# Patient Record
Sex: Female | Born: 1965 | Race: White | Hispanic: No | Marital: Single | Smoking: Former smoker
Health system: Southern US, Community
[De-identification: ages and names within clinical notes are randomized; demographics above are authoritative.]

## PROBLEM LIST (undated history)

## (undated) DIAGNOSIS — J40 Bronchitis, not specified as acute or chronic: Secondary | ICD-10-CM

## (undated) DIAGNOSIS — E785 Hyperlipidemia, unspecified: Secondary | ICD-10-CM

## (undated) DIAGNOSIS — I10 Essential (primary) hypertension: Secondary | ICD-10-CM

## (undated) DIAGNOSIS — R51 Headache: Secondary | ICD-10-CM

## (undated) DIAGNOSIS — J189 Pneumonia, unspecified organism: Secondary | ICD-10-CM

## (undated) DIAGNOSIS — G4730 Sleep apnea, unspecified: Secondary | ICD-10-CM

## (undated) DIAGNOSIS — R519 Headache, unspecified: Secondary | ICD-10-CM

## (undated) DIAGNOSIS — E039 Hypothyroidism, unspecified: Secondary | ICD-10-CM

## (undated) DIAGNOSIS — E119 Type 2 diabetes mellitus without complications: Secondary | ICD-10-CM

## (undated) DIAGNOSIS — Z923 Personal history of irradiation: Secondary | ICD-10-CM

## (undated) DIAGNOSIS — R0683 Snoring: Secondary | ICD-10-CM

## (undated) DIAGNOSIS — T7840XA Allergy, unspecified, initial encounter: Secondary | ICD-10-CM

## (undated) DIAGNOSIS — M2550 Pain in unspecified joint: Secondary | ICD-10-CM

## (undated) DIAGNOSIS — C73 Malignant neoplasm of thyroid gland: Secondary | ICD-10-CM

## (undated) DIAGNOSIS — E669 Obesity, unspecified: Secondary | ICD-10-CM

## (undated) DIAGNOSIS — E282 Polycystic ovarian syndrome: Secondary | ICD-10-CM

## (undated) DIAGNOSIS — K219 Gastro-esophageal reflux disease without esophagitis: Secondary | ICD-10-CM

## (undated) DIAGNOSIS — C50919 Malignant neoplasm of unspecified site of unspecified female breast: Secondary | ICD-10-CM

## (undated) HISTORY — DX: Polycystic ovarian syndrome: E28.2

## (undated) HISTORY — PX: TONSILLECTOMY: SUR1361

## (undated) HISTORY — DX: Hyperlipidemia, unspecified: E78.5

## (undated) HISTORY — DX: Type 2 diabetes mellitus without complications: E11.9

## (undated) HISTORY — DX: Essential (primary) hypertension: I10

## (undated) HISTORY — DX: Malignant neoplasm of thyroid gland: C73

## (undated) HISTORY — DX: Allergy, unspecified, initial encounter: T78.40XA

## (undated) HISTORY — PX: CORONARY ANGIOPLASTY: SHX604

## (undated) HISTORY — DX: Pain in unspecified joint: M25.50

## (undated) HISTORY — DX: Personal history of irradiation: Z92.3

## (undated) HISTORY — DX: Obesity, unspecified: E66.9

---
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
## 1998-01-23 HISTORY — PX: BREAST BIOPSY: SHX20

## 2002-01-23 HISTORY — PX: DILATION AND CURETTAGE OF UTERUS: SHX78

## 2003-07-06 ENCOUNTER — Encounter: Payer: Self-pay | Admitting: Internal Medicine

## 2003-07-06 LAB — CONVERTED CEMR LAB

## 2005-05-15 ENCOUNTER — Emergency Department (HOSPITAL_COMMUNITY): Admission: EM | Admit: 2005-05-15 | Discharge: 2005-05-15 | Payer: Self-pay | Admitting: Emergency Medicine

## 2005-11-13 ENCOUNTER — Emergency Department (HOSPITAL_COMMUNITY): Admission: EM | Admit: 2005-11-13 | Discharge: 2005-11-13 | Payer: Self-pay | Admitting: Family Medicine

## 2006-01-23 DIAGNOSIS — G4730 Sleep apnea, unspecified: Secondary | ICD-10-CM

## 2006-01-23 HISTORY — DX: Sleep apnea, unspecified: G47.30

## 2006-03-30 ENCOUNTER — Inpatient Hospital Stay (HOSPITAL_COMMUNITY): Admission: EM | Admit: 2006-03-30 | Discharge: 2006-04-01 | Payer: Self-pay | Admitting: Emergency Medicine

## 2006-05-28 ENCOUNTER — Ambulatory Visit (HOSPITAL_COMMUNITY): Admission: RE | Admit: 2006-05-28 | Discharge: 2006-05-28 | Payer: Self-pay | Admitting: Internal Medicine

## 2006-05-28 ENCOUNTER — Ambulatory Visit: Payer: Self-pay | Admitting: Internal Medicine

## 2006-05-28 ENCOUNTER — Encounter: Admission: RE | Admit: 2006-05-28 | Discharge: 2006-08-26 | Payer: Self-pay | Admitting: Internal Medicine

## 2006-07-04 ENCOUNTER — Ambulatory Visit: Payer: Self-pay | Admitting: Internal Medicine

## 2006-07-04 LAB — CONVERTED CEMR LAB
Bilirubin, Direct: 0.1 mg/dL (ref 0.0–0.3)
Calcium: 9.2 mg/dL (ref 8.4–10.5)
Cholesterol: 132 mg/dL (ref 0–200)
Creatinine,U: 150.1 mg/dL
GFR calc Af Amer: 175 mL/min
GFR calc non Af Amer: 145 mL/min
Glucose, Bld: 145 mg/dL — ABNORMAL HIGH (ref 70–99)
HDL: 32.9 mg/dL — ABNORMAL LOW (ref >39.0)
LDL Cholesterol: 78 mg/dL (ref 0–99)
Microalb Creat Ratio: 36 mg/g — ABNORMAL HIGH (ref 0.0–30.0)
Potassium: 4.4 meq/L (ref 3.5–5.1)
Sodium: 140 meq/L (ref 135–145)
Total CHOL/HDL Ratio: 4
Triglycerides: 108 mg/dL (ref 0–149)
Uric Acid, Serum: 7.2 mg/dL — ABNORMAL HIGH (ref 2.4–7.0)

## 2006-07-09 ENCOUNTER — Ambulatory Visit: Payer: Self-pay | Admitting: Internal Medicine

## 2006-08-21 ENCOUNTER — Ambulatory Visit (HOSPITAL_BASED_OUTPATIENT_CLINIC_OR_DEPARTMENT_OTHER): Admission: RE | Admit: 2006-08-21 | Discharge: 2006-08-21 | Payer: Self-pay | Admitting: Internal Medicine

## 2006-08-23 ENCOUNTER — Ambulatory Visit: Payer: Self-pay | Admitting: Internal Medicine

## 2006-09-05 ENCOUNTER — Ambulatory Visit: Payer: Self-pay | Admitting: Pulmonary Disease

## 2006-12-13 ENCOUNTER — Ambulatory Visit: Payer: Self-pay | Admitting: Internal Medicine

## 2006-12-13 DIAGNOSIS — E1159 Type 2 diabetes mellitus with other circulatory complications: Secondary | ICD-10-CM | POA: Insufficient documentation

## 2006-12-13 DIAGNOSIS — I10 Essential (primary) hypertension: Secondary | ICD-10-CM | POA: Insufficient documentation

## 2006-12-13 LAB — CONVERTED CEMR LAB
CO2: 27 meq/L (ref 19–32)
Creatinine, Ser: 0.5 mg/dL (ref 0.4–1.2)
GFR calc Af Amer: 175 mL/min
Glucose, Bld: 131 mg/dL — ABNORMAL HIGH (ref 70–99)
Potassium: 4.6 meq/L (ref 3.5–5.1)

## 2006-12-18 ENCOUNTER — Ambulatory Visit: Payer: Self-pay | Admitting: Internal Medicine

## 2006-12-18 DIAGNOSIS — F17200 Nicotine dependence, unspecified, uncomplicated: Secondary | ICD-10-CM | POA: Insufficient documentation

## 2006-12-18 DIAGNOSIS — E1169 Type 2 diabetes mellitus with other specified complication: Secondary | ICD-10-CM | POA: Insufficient documentation

## 2006-12-18 DIAGNOSIS — E785 Hyperlipidemia, unspecified: Secondary | ICD-10-CM | POA: Insufficient documentation

## 2006-12-18 DIAGNOSIS — Z87898 Personal history of other specified conditions: Secondary | ICD-10-CM | POA: Insufficient documentation

## 2006-12-18 DIAGNOSIS — J309 Allergic rhinitis, unspecified: Secondary | ICD-10-CM | POA: Insufficient documentation

## 2007-01-10 ENCOUNTER — Encounter: Admission: RE | Admit: 2007-01-10 | Discharge: 2007-01-10 | Payer: Self-pay | Admitting: Internal Medicine

## 2007-06-14 ENCOUNTER — Ambulatory Visit: Payer: Self-pay | Admitting: Internal Medicine

## 2007-06-18 ENCOUNTER — Ambulatory Visit: Payer: Self-pay | Admitting: Internal Medicine

## 2007-06-18 LAB — CONVERTED CEMR LAB
Albumin: 3.7 g/dL (ref 3.5–5.2)
Alkaline Phosphatase: 69 units/L (ref 39–117)
BUN: 10 mg/dL (ref 6–23)
Calcium: 9.2 mg/dL (ref 8.4–10.5)
Cholesterol: 115 mg/dL (ref 0–200)
GFR calc Af Amer: 174 mL/min
GFR calc non Af Amer: 144 mL/min
HDL: 35.5 mg/dL — ABNORMAL LOW (ref >39.0)
Hgb A1c MFr Bld: 5.8 % (ref 4.6–6.0)
LDL Cholesterol: 55 mg/dL (ref 0–99)
Microalb Creat Ratio: 8.3 mg/g (ref 0.0–30.0)
Microalb, Ur: 1.2 mg/dL (ref 0.0–1.9)
Potassium: 4.6 meq/L (ref 3.5–5.1)
Sodium: 137 meq/L (ref 135–145)
Total Protein: 6.9 g/dL (ref 6.0–8.3)
VLDL: 24 mg/dL (ref 0–40)

## 2007-06-27 ENCOUNTER — Ambulatory Visit: Payer: Self-pay | Admitting: Internal Medicine

## 2007-07-05 ENCOUNTER — Encounter: Payer: Self-pay | Admitting: Internal Medicine

## 2007-10-15 ENCOUNTER — Other Ambulatory Visit: Admission: RE | Admit: 2007-10-15 | Discharge: 2007-10-15 | Payer: Self-pay | Admitting: Obstetrics and Gynecology

## 2007-11-06 ENCOUNTER — Ambulatory Visit (HOSPITAL_COMMUNITY): Admission: RE | Admit: 2007-11-06 | Discharge: 2007-11-06 | Payer: Self-pay | Admitting: Obstetrics and Gynecology

## 2007-11-18 LAB — CONVERTED CEMR LAB: Pap Smear: NORMAL

## 2007-12-18 ENCOUNTER — Emergency Department (HOSPITAL_COMMUNITY): Admission: EM | Admit: 2007-12-18 | Discharge: 2007-12-18 | Payer: Self-pay | Admitting: Emergency Medicine

## 2008-01-09 ENCOUNTER — Ambulatory Visit: Payer: Self-pay | Admitting: Internal Medicine

## 2008-01-09 DIAGNOSIS — E8881 Metabolic syndrome: Secondary | ICD-10-CM | POA: Insufficient documentation

## 2008-01-09 LAB — CONVERTED CEMR LAB
ALT: 17 units/L (ref 0–35)
AST: 13 units/L (ref 0–37)
Alkaline Phosphatase: 72 units/L (ref 39–117)
BUN: 14 mg/dL (ref 6–23)
Basophils Absolute: 0.1 10*3/uL (ref 0.0–0.1)
Basophils Relative: 0.5 % (ref 0.0–3.0)
Bilirubin Urine: NEGATIVE
CO2: 28 meq/L (ref 19–32)
Chloride: 103 meq/L (ref 96–112)
Direct LDL: 70.1 mg/dL
Eosinophils Relative: 2.4 % (ref 0.0–5.0)
GFR calc non Af Amer: 98 mL/min
HDL goal, serum: 40 mg/dL
HDL: 51.2 mg/dL (ref >39.0)
Hemoglobin: 13.1 g/dL (ref 12.0–15.0)
Hgb A1c MFr Bld: 6 % (ref 4.6–6.0)
Ketones, ur: NEGATIVE mg/dL
Lymphocytes Relative: 23 % (ref 12.0–46.0)
MCHC: 34.4 g/dL (ref 30.0–36.0)
Monocytes Relative: 5.4 % (ref 3.0–12.0)
Mucus, UA: NEGATIVE
Neutro Abs: 7 10*3/uL (ref 1.4–7.7)
Neutrophils Relative %: 68.7 % (ref 43.0–77.0)
Potassium: 3.8 meq/L (ref 3.5–5.1)
RBC: 4.52 M/uL (ref 3.87–5.11)
Specific Gravity, Urine: 1.025 (ref 1.000–1.03)
Total Bilirubin: 0.5 mg/dL (ref 0.3–1.2)
Total CHOL/HDL Ratio: 2.7
VLDL: 43 mg/dL — ABNORMAL HIGH (ref 0–40)
WBC: 10.3 10*3/uL (ref 4.5–10.5)
pH: 6 (ref 5.0–8.0)

## 2008-01-10 ENCOUNTER — Encounter: Payer: Self-pay | Admitting: Internal Medicine

## 2008-01-14 ENCOUNTER — Encounter: Payer: Self-pay | Admitting: Internal Medicine

## 2008-01-14 ENCOUNTER — Encounter: Admission: RE | Admit: 2008-01-14 | Discharge: 2008-01-14 | Payer: Self-pay | Admitting: Internal Medicine

## 2008-02-18 ENCOUNTER — Encounter: Payer: Self-pay | Admitting: Internal Medicine

## 2008-06-29 LAB — HM DIABETES EYE EXAM: HM Diabetic Eye Exam: NORMAL

## 2008-07-13 ENCOUNTER — Ambulatory Visit: Payer: Self-pay | Admitting: Internal Medicine

## 2008-07-17 ENCOUNTER — Ambulatory Visit: Payer: Self-pay | Admitting: Internal Medicine

## 2008-07-17 LAB — CONVERTED CEMR LAB
ALT: 16 units/L (ref 0–35)
AST: 15 units/L (ref 0–37)
Albumin: 3.7 g/dL (ref 3.5–5.2)
Calcium: 9.3 mg/dL (ref 8.4–10.5)
Cholesterol: 105 mg/dL (ref 0–200)
GFR calc non Af Amer: 115.71 mL/min (ref >60)
HDL: 43.4 mg/dL (ref >39.00)
Potassium: 4.2 meq/L (ref 3.5–5.1)
Sodium: 142 meq/L (ref 135–145)
TSH: 0.67 microintl units/mL (ref 0.35–5.50)
Total Protein: 7 g/dL (ref 6.0–8.3)
Triglycerides: 106 mg/dL (ref 0.0–149.0)

## 2008-08-17 ENCOUNTER — Emergency Department (HOSPITAL_COMMUNITY): Admission: EM | Admit: 2008-08-17 | Discharge: 2008-08-17 | Payer: Self-pay | Admitting: Emergency Medicine

## 2008-08-22 ENCOUNTER — Emergency Department (HOSPITAL_COMMUNITY): Admission: EM | Admit: 2008-08-22 | Discharge: 2008-08-22 | Payer: Self-pay | Admitting: Family Medicine

## 2008-11-02 ENCOUNTER — Ambulatory Visit (HOSPITAL_COMMUNITY): Admission: RE | Admit: 2008-11-02 | Discharge: 2008-11-02 | Payer: Self-pay | Admitting: Obstetrics and Gynecology

## 2008-11-02 ENCOUNTER — Encounter: Payer: Self-pay | Admitting: Internal Medicine

## 2008-11-02 ENCOUNTER — Other Ambulatory Visit: Admission: RE | Admit: 2008-11-02 | Discharge: 2008-11-02 | Payer: Self-pay | Admitting: Obstetrics and Gynecology

## 2008-11-10 ENCOUNTER — Telehealth: Payer: Self-pay | Admitting: Internal Medicine

## 2008-11-10 DIAGNOSIS — C73 Malignant neoplasm of thyroid gland: Secondary | ICD-10-CM | POA: Insufficient documentation

## 2008-11-10 DIAGNOSIS — Z85850 Personal history of malignant neoplasm of thyroid: Secondary | ICD-10-CM | POA: Insufficient documentation

## 2008-11-12 ENCOUNTER — Encounter: Payer: Self-pay | Admitting: Internal Medicine

## 2008-11-12 ENCOUNTER — Encounter (INDEPENDENT_AMBULATORY_CARE_PROVIDER_SITE_OTHER): Payer: Self-pay | Admitting: Diagnostic Radiology

## 2008-11-12 ENCOUNTER — Ambulatory Visit (HOSPITAL_COMMUNITY): Admission: RE | Admit: 2008-11-12 | Discharge: 2008-11-12 | Payer: Self-pay | Admitting: Internal Medicine

## 2008-11-24 ENCOUNTER — Telehealth: Payer: Self-pay | Admitting: Internal Medicine

## 2008-12-03 ENCOUNTER — Encounter (HOSPITAL_COMMUNITY): Admission: RE | Admit: 2008-12-03 | Discharge: 2009-01-20 | Payer: Self-pay | Admitting: Internal Medicine

## 2009-01-06 ENCOUNTER — Telehealth: Payer: Self-pay | Admitting: Internal Medicine

## 2009-01-12 ENCOUNTER — Ambulatory Visit: Payer: Self-pay | Admitting: Internal Medicine

## 2009-01-12 LAB — CONVERTED CEMR LAB
BUN: 13 mg/dL (ref 6–23)
Calcium: 8.8 mg/dL (ref 8.4–10.5)
Chloride: 102 meq/L (ref 96–112)
Creatinine, Ser: 0.5 mg/dL (ref 0.4–1.2)
GFR calc non Af Amer: 142.48 mL/min (ref >60)
Hgb A1c MFr Bld: 5.6 % (ref 4.6–6.5)

## 2009-01-18 ENCOUNTER — Ambulatory Visit: Payer: Self-pay | Admitting: Internal Medicine

## 2009-01-19 ENCOUNTER — Encounter: Admission: RE | Admit: 2009-01-19 | Discharge: 2009-01-19 | Payer: Self-pay | Admitting: Internal Medicine

## 2009-02-03 ENCOUNTER — Encounter: Payer: Self-pay | Admitting: Internal Medicine

## 2009-02-23 HISTORY — PX: THYROIDECTOMY: SHX17

## 2009-03-18 ENCOUNTER — Encounter (INDEPENDENT_AMBULATORY_CARE_PROVIDER_SITE_OTHER): Payer: Self-pay | Admitting: Surgery

## 2009-03-18 ENCOUNTER — Ambulatory Visit (HOSPITAL_COMMUNITY): Admission: RE | Admit: 2009-03-18 | Discharge: 2009-03-19 | Payer: Self-pay | Admitting: Surgery

## 2009-04-07 ENCOUNTER — Encounter: Payer: Self-pay | Admitting: Internal Medicine

## 2009-05-03 ENCOUNTER — Emergency Department (HOSPITAL_COMMUNITY): Admission: EM | Admit: 2009-05-03 | Discharge: 2009-05-03 | Payer: Self-pay | Admitting: Family Medicine

## 2009-05-04 ENCOUNTER — Encounter (HOSPITAL_COMMUNITY): Admission: RE | Admit: 2009-05-04 | Discharge: 2009-07-23 | Payer: Self-pay | Admitting: Internal Medicine

## 2009-07-02 ENCOUNTER — Ambulatory Visit: Payer: Self-pay | Admitting: Internal Medicine

## 2009-07-02 LAB — CONVERTED CEMR LAB
CO2: 30 meq/L (ref 19–32)
Calcium: 9.2 mg/dL (ref 8.4–10.5)
Glucose, Bld: 104 mg/dL — ABNORMAL HIGH (ref 70–99)
Sodium: 141 meq/L (ref 135–145)

## 2009-07-09 ENCOUNTER — Encounter: Payer: Self-pay | Admitting: Internal Medicine

## 2009-09-23 ENCOUNTER — Encounter: Payer: Self-pay | Admitting: Internal Medicine

## 2009-10-04 ENCOUNTER — Encounter: Payer: Self-pay | Admitting: Internal Medicine

## 2009-11-17 ENCOUNTER — Encounter
Admission: RE | Admit: 2009-11-17 | Discharge: 2009-11-17 | Payer: Self-pay | Source: Home / Self Care | Attending: Internal Medicine | Admitting: Internal Medicine

## 2009-11-17 ENCOUNTER — Encounter: Payer: Self-pay | Admitting: Internal Medicine

## 2009-11-24 ENCOUNTER — Other Ambulatory Visit: Admission: RE | Admit: 2009-11-24 | Discharge: 2009-11-24 | Payer: Self-pay | Admitting: Obstetrics and Gynecology

## 2009-12-01 ENCOUNTER — Emergency Department (HOSPITAL_COMMUNITY): Admission: EM | Admit: 2009-12-01 | Discharge: 2009-12-01 | Payer: Self-pay | Admitting: Family Medicine

## 2009-12-07 ENCOUNTER — Ambulatory Visit (HOSPITAL_COMMUNITY): Admission: RE | Admit: 2009-12-07 | Discharge: 2009-12-07 | Payer: Self-pay | Admitting: Internal Medicine

## 2010-01-06 ENCOUNTER — Ambulatory Visit: Payer: Self-pay | Admitting: Internal Medicine

## 2010-01-06 LAB — CONVERTED CEMR LAB
Creatinine, Ser: 0.5 mg/dL (ref 0.4–1.2)
GFR calc non Af Amer: 160.18 mL/min (ref >60.00)
Glucose, Bld: 128 mg/dL — ABNORMAL HIGH (ref 70–99)
Hgb A1c MFr Bld: 6.2 % (ref 4.6–6.5)
Potassium: 4.3 meq/L (ref 3.5–5.1)
Sodium: 139 meq/L (ref 135–145)

## 2010-01-10 ENCOUNTER — Ambulatory Visit: Payer: Self-pay | Admitting: Internal Medicine

## 2010-01-19 ENCOUNTER — Encounter: Payer: Self-pay | Admitting: Internal Medicine

## 2010-01-20 ENCOUNTER — Encounter
Admission: RE | Admit: 2010-01-20 | Discharge: 2010-01-20 | Payer: Self-pay | Source: Home / Self Care | Attending: Internal Medicine | Admitting: Internal Medicine

## 2010-01-20 LAB — HM MAMMOGRAPHY: HM Mammogram: NORMAL

## 2010-02-17 ENCOUNTER — Telehealth: Payer: Self-pay | Admitting: Internal Medicine

## 2010-02-22 NOTE — Letter (Signed)
Summary: Order & Patient Visit/[facility]  Order & Patient Visit/[facility]   Imported By: Sherian Rein 10/06/2009 13:34:46  _____________________________________________________________________  External Attachment:    Type:   Image     Comment:   External Document

## 2010-02-22 NOTE — Consult Note (Signed)
Summary: [facility]  [facility]   Imported By: Lanelle Bal 11/29/2009 12:17:03  _____________________________________________________________________  External Attachment:    Type:   Image     Comment:   External Document

## 2010-02-22 NOTE — Miscellaneous (Signed)
Summary: Orders Update  Clinical Lists Changes  Orders: Added new Test order of T-Basic Metabolic Panel ([id number]) - Signed Added new Test order of T- Hemoglobin A1C ([id number]) - Signed 

## 2010-02-22 NOTE — Assessment & Plan Note (Signed)
Summary: 6 months rov-ch rsc with pt/mhf   Vital Signs:  Patient profile:   45 year old female Weight:      224 pounds Temp:     98.5 degrees F oral BP sitting:   136 / 90  (left arm) Cuff size:   large  Vitals Entered By: Alfred Levins, CMA (January 18, 2009 1:56 PM) CC: f/u, discuss labs, Type 2 diabetes mellitus follow-up   Primary Care Provider:  DThomos Lemons DO  CC:  f/u, discuss labs, and Type 2 diabetes mellitus follow-up.  History of Present Illness:  Type 2 Diabetes Mellitus Follow-Up      This is a 45 year old woman who presents for Type 2 diabetes mellitus follow-up.  The patient denies weight gain.  The patient denies the following symptoms: chest pain.  Since the last visit the patient reports good dietary compliance, compliance with medications, and monitoring blood glucose.    Left thyroid nodule - reviewed thyroid studies.  Htn - she has been taking bp meds 3 x per week  Hyperlipidemia - she takes crestor 3 x per week  Current Medications (verified): 1)  Glucophage 1000 Mg  Tabs (Metformin Hcl) .... One By Mouth Two Times A Day 2)  Baby Aspirin 81 Mg  Chew (Aspirin) .... One By Mouth Once Daily 3)  Onetouch Lancets   Misc (Lancets) .... Useone Lancet Three Times A Day 4)  Benicar Hct 20-12.5 Mg  Tabs (Olmesartan Medoxomil-Hctz) .Marland Kitchen.. 1 Tab By Mouth Once Daily 5)  Crestor 10 Mg  Tabs (Rosuvastatin Calcium) .Marland Kitchen.. 1 Tab By Mouth At Bedtime 6)  Byetta 10 Mcg Pen 10 Mcg/0.62ml Soln (Exenatide) .... Inject Two Times A Day Subcutaneously 7)  Onetouch Ultra Test   Strp (Glucose Blood) .... Once Daily Testing 8)  Calcium 1500 Mg Tabs (Calcium Carbonate) .... Once Daily 9)  Vitamin D 1000 Unit Tabs (Cholecalciferol) .... Once Daily  Allergies (verified): 1)  ! Ace Inhibitors  Past History:  Past Medical History: DM Type II Obesity Hypertension  Hyperlipidemia   Family History: As noted above.  Father deceased at age 28 secondary to dementia.  He  had bipolar disorder and also had emphysema.      Physical Exam  General:  alert, well-developed, and well-nourished.   Neck:  supple.  1 cm post cervical chain LN,  non tender palpable left thyrid nodule Lungs:  normal respiratory effort and normal breath sounds.   Heart:  normal rate, regular rhythm, and no gallop.   Extremities:  No lower extremity edema    Impression & Recommendations:  Problem # 1:  THYROID NODULE (ICD-241.0) Thyroid u/s showed dominant 2.4 cm left mid pole nodule.  FNA was inconclusive.  I would favor excision based on size of nodule.  She already has referral to Dr. Silvano Rusk.  12/04/2008  24 hour I 131 uptake = 28.8 % (normal 10-30%)    IMPRESSION:   1.  Cold nodule within the mid-left thyroid gland corresponds to   the dominant nodule on comparison ultrasound.   2. Small warm nodule in the inferior right lobe of the thyroid   gland.   3.  Normal 24 hours iodine uptake.  Problem # 2:  DIABETES MELLITUS, TYPE II (ICD-250.00) Assessment: Improved Her original wt as 286 lbs.  continue glucophage.  she stopped byetta.  she continues healthy diet.  occ fast food on weekends.  The following medications were removed from the medication list:    Byetta  10 Mcg Pen 10 Mcg/0.39ml Soln (Exenatide) ..... Inject two times a day subcutaneously Her updated medication list for this problem includes:    Glucophage 1000 Mg Tabs (Metformin hcl) ..... One by mouth two times a day    Baby Aspirin 81 Mg Chew (Aspirin) ..... One by mouth once daily    Benicar 20 Mg Tabs (Olmesartan medoxomil) .Marland Kitchen... 1 by mouth qd  Labs Reviewed: Creat: 0.5 (01/12/2009)     Last Eye Exam: normal (06/29/2008) Reviewed HgBA1c results: 5.6 (01/12/2009)  5.4 (07/17/2008)  Problem # 3:  HYPERTENSION (ICD-401.9) Pt previously had low bp.  she has been taking benicar/hct 3 x per week.  change to just benicar and take 1/2 to 1 tab once daily.  monitor bp at home.  Her updated medication list  for this problem includes:    Benicar 20 Mg Tabs (Olmesartan medoxomil) .Marland Kitchen... 1 by mouth qd  BP today: 136/90 Prior BP: 106/80 (07/13/2008)  Prior 10 Yr Risk Heart Disease: 9 % (01/09/2008)  Labs Reviewed: K+: 4.2 (01/12/2009) Creat: : 0.5 (01/12/2009)   Chol: 105 (07/17/2008)   HDL: 43.40 (07/17/2008)   LDL: 40 (07/17/2008)   TG: 106.0 (07/17/2008)  Complete Medication List: 1)  Glucophage 1000 Mg Tabs (Metformin hcl) .... One by mouth two times a day 2)  Baby Aspirin 81 Mg Chew (Aspirin) .... One by mouth once daily 3)  Onetouch Lancets Misc (Lancets) .... Useone lancet three times a day 4)  Benicar 20 Mg Tabs (Olmesartan medoxomil) .Marland Kitchen.. 1 by mouth qd 5)  Crestor 10 Mg Tabs (Rosuvastatin calcium) .Marland Kitchen.. 1 tab by mouth at bedtime 6)  Onetouch Ultra Test Strp (Glucose blood) .... Once daily testing 7)  Calcium 1500 Mg Tabs (Calcium carbonate) .... Once daily 8)  Vitamin D 1000 Unit Tabs (Cholecalciferol) .... Once daily  Patient Instructions: 1)  Please schedule a follow-up appointment in 6 months. 2)  BMP prior to visit, ICD-9:  401.9 3)  HbgA1C prior to visit, ICD-9:  250.00 4)  Please return for lab work one (1) week before your next appointment.  Prescriptions: BENICAR 20 MG TABS (OLMESARTAN MEDOXOMIL) 1 by mouth qd  #90 x 1   Entered and Authorized by:   D. Thomos Lemons DO   Signed by:   D. Thomos Lemons DO on 01/18/2009   Method used:   Electronically to        [facility]* (retail)       [address]       [address]mailing       [place], [place]  [address]       Ph: [phone]       Fax: [phone]   RxID:   [id number]

## 2010-02-22 NOTE — Assessment & Plan Note (Signed)
Summary: 6 month follow up/mhf--Rm 3   Vital Signs:  Patient profile:   45 year old female Height:      64 inches Weight:      236 pounds BMI:     40.66 O2 Sat:      99 % on Room air Temp:     97.5 degrees F oral Pulse rate:   72 / minute Pulse rhythm:   regular Resp:     16 per minute BP sitting:   128 / 90  (right arm) Cuff size:   large  Vitals Entered By: Mervin Kung CMA (July 09, 2009 8:13 AM)  O2 Flow:  Room air CC: Room 3  6 month follow up.  High BS--135  Low BS--70  AVG BS--109 Is Patient Diabetic? Yes   Primary Care Provider:  Dondra Spry DO  CC:  Room 3  6 month follow up.  High BS--135  Low BS--70  AVG BS--109.  History of Present Illness: 45 y/o white female for f/u she was referred for thyroid cancer - s/p surgery and ablation incision well healed.     DM II - "let myself go" for a while.  regained some of her weight.  motivated to get back on track  Allergies: 1)  ! Ace Inhibitors  Past History:  Past Medical History: DM Type II Obesity Hypertension   Hyperlipidemia  Thyroid cancer  Past Surgical History: status post breast biopsy 2003 Tonsillectomy 1982 status post  D&C 2004   Thyroidectomy  Family History: As noted above.  Father deceased at age 11 secondary to dementia.  He had bipolar disorder and also had emphysema.       Social History: The patient is divorced, moved to [place] in 2006 from [place].  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  No alcohol.  Tobacco use:  She quit 1 year ago.  She smoked on and off for approximately 20 years.  No history of recreational drug use.   Physical Exam  General:  alert, well-developed, and well-nourished.   Lungs:  normal respiratory effort and normal breath sounds.   Heart:  normal rate, regular rhythm, and no gallop.   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   TSH level followed by endo  Problem # 2:  DIABETES MELLITUS, TYPE II (ICD-250.00) some wt gain.  pt attributes recent stress from surgery.  pt motivated to restart exercise program  Her updated medication list for this problem includes:    Glucophage 1000 Mg Tabs (Metformin hcl) ..... One by mouth two times a day    Baby Aspirin 81 Mg Chew (Aspirin) ..... One by mouth once daily    Benicar 20 Mg Tabs (Olmesartan medoxomil) .Marland Kitchen... 1 by mouth qd  Labs Reviewed: Creat: 0.6 (07/02/2009)     Last Eye Exam: normal (06/29/2008) Reviewed HgBA1c results: 5.6 (07/02/2009)  5.6 (01/12/2009)  Complete Medication List: 1)  Glucophage 1000 Mg Tabs (Metformin hcl) .... One by mouth two times a day 2)  Baby Aspirin 81 Mg Chew (Aspirin) .... One by mouth once daily 3)  Onetouch Lancets Misc (Lancets) .... Useone lancet three times a day 4)  Benicar 20 Mg Tabs (Olmesartan medoxomil) .Marland Kitchen.. 1 by mouth qd 5)  Crestor 10 Mg Tabs (  Rosuvastatin calcium) .Marland Kitchen.. 1 tab by mouth at bedtime 6)  Onetouch Ultra Test Strp (Glucose blood) .... Once daily testing 7)  Calcium 1500 Mg Tabs (Calcium carbonate) .... Once daily 8)  Vitamin D 1000 Unit Tabs (Cholecalciferol) .... Once daily 9)  Levothyroxine Sodium 175 Mcg Tabs (Levothyroxine sodium) .... One by mouth once daily  Patient Instructions: 1)  Please schedule a follow-up appointment in 6 months. 2)  BMP prior to visit, ICD-9: 250.00 3)  HbgA1C prior to visit, ICD-9: 250.00 4)  Please return for lab work one (1) week before your next appointment.  Prescriptions: ONETOUCH ULTRA TEST   STRP (GLUCOSE BLOOD) once daily testing  #100 x 3   Entered and Authorized by:   D. Thomos Lemons DO   Signed by:   D. Thomos Lemons DO on 07/09/2009   Method used:   Electronically to        [facility]*  (retail)       [address]       [address] Shipping/mailing       [place], [place]  [address]       Ph: [phone]       Fax: [phone]   RxID:   [id number] [facility] LANCETS   MISC (LANCETS) useone lancet three times a day  #100 x 5   Entered and Authorized by:   D. Thomos Lemons DO   Signed by:   D. Thomos Lemons DO on 07/09/2009   Method used:   Electronically to        [facility]* (retail)       [address]       [address] Shipping/mailing       [place], [place]  [address]       Ph: [phone]       Fax: [phone]   RxID:   [id number] CRESTOR 10 MG  TABS (ROSUVASTATIN CALCIUM) 1 tab by mouth at bedtime  #90 x 3   Entered and Authorized by:   D. Thomos Lemons DO   Signed by:   D. Thomos Lemons DO on 07/09/2009   Method used:   Electronically to        [facility]* (retail)       [address]       [address] Shipping/mailing       [place], [place]  [address]       Ph: [phone]       Fax: [phone]   RxID:   [id number] BENICAR 20 MG TABS (OLMESARTAN MEDOXOMIL) 1 by mouth qd  #90 x 3   Entered and Authorized by:   D. Thomos Lemons DO   Signed by:   D. Thomos Lemons DO on 07/09/2009   Method used:   Electronically to        [facility]* (retail)       [address]       [address] Shipping/mailing       [place], [place]  [address]       Ph: [phone]       Fax: [phone]   RxID:   [id number] GLUCOPHAGE 1000 MG  TABS (METFORMIN HCL) one by mouth two times a day  #180 x 3   Entered and Authorized by:   D. Thomos Lemons DO   Signed by:   D. Thomos Lemons DO on 07/09/2009   Method used:  Electronically to        [facility]* (retail)       [address]       [address] Shipping/mailing       [place], [place]  [address]       Ph: [phone]       Fax: [phone]   RxID:   [id number]

## 2010-02-22 NOTE — Letter (Signed)
Summary: [facility]   Imported By: Lanelle Bal 04/21/2009 09:22:58  _____________________________________________________________________  External Attachment:    Type:   Image     Comment:   External Document

## 2010-02-22 NOTE — Letter (Signed)
Summary: [facility]   Imported By: Lanelle Bal 02/22/2009 12:42:07  _____________________________________________________________________  External Attachment:    Type:   Image     Comment:   External Document

## 2010-02-24 NOTE — Progress Notes (Signed)
Summary: Medication Change  Phone Note Outgoing Call   Summary of Call: call pt - received correspondence from [facility] re: switch from onglyza to [place] ok to switch with next rx  Initial call taken by: D. Thomos Lemons DO,  February 17, 2010 1:06 PM  Follow-up for Phone Call        call placed to patient at [phone], no answer. A detailed voice message was left informing patient per Dr Artist Pais instructions. Message was left for patientto call back if any questions Follow-up by: Glendell Docker CMA,  February 18, 2010 8:53 AM    New/Updated Medications: JANUVIA 100 MG TABS (SITAGLIPTIN PHOSPHATE) one by mouth once daily Prescriptions: JANUVIA 100 MG TABS (SITAGLIPTIN PHOSPHATE) one by mouth once daily  #90 x 1   Entered and Authorized by:   D. Thomos Lemons DO   Signed by:   D. Thomos Lemons DO on 02/17/2010   Method used:   Electronically to        [facility]* (retail)       [address]       [address] [place] [address]mailing       [place], [place]  [address]       Ph: [phone]       Fax: [phone]   RxID:   [id number]

## 2010-02-24 NOTE — Assessment & Plan Note (Signed)
Summary: 6 month fu/dt   Vital Signs:  Patient profile:   45 year old female Height:      64 inches Weight:      249.25 pounds BMI:     42.94 O2 Sat:      98 % on Room air Temp:     97.6 degrees F oral Pulse rate:   83 / minute Resp:     20 per minute BP sitting:   126 / 88  (right arm) Cuff size:   large  Vitals Entered By: Glendell Docker CMA (January 10, 2010 8:02 AM)  O2 Flow:  Room air CC: 6 month follow up  Is Patient Diabetic? Yes Did you bring your meter with you today? No Pain Assessment Patient in pain? no        Primary Care Latecia Miler:  Dondra Spry DO  CC:  6 month follow up .  History of Present Illness: 45 y/o white female for f/u ongoing issues with stress eating / emotional eating gained addt'l 14 lbs since prev visit not exercising regularly went back to her old eating habits   hypothyroidism - managed by Dr Sharl Ma.  feels tired despite thyroid replacement  Preventive Screening-Counseling & Management  Alcohol-Tobacco     Smoking Status: quit  Allergies: 1)  ! 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 Sharl Ma  Past Surgical History: status post breast biopsy 2003 Tonsillectomy 1982 status post  D&C 2004     Thyroidectomy 02/2009 - Dr. Gerrit Friends  Family History: As noted above.  Father deceased at age 8 secondary to dementia.  He had bipolar disorder and also had emphysema.        Social History: The patient is divorced, moved to [place] in 2006 from [place].  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   No alcohol.  Tobacco use:  She quit 1 year ago.  She smoked on and off for approximately 20 years.  No history of recreational  drug use.   Physical Exam  General:  alert and overweight-appearing.   Lungs:  normal respiratory effort and normal breath sounds.   Heart:  normal rate, regular rhythm, and no gallop.   Extremities:  No lower extremity edema   Impression & Recommendations:  Problem # 1:  DIABETES MELLITUS, TYPE II (ICD-250.00) Assessment Deteriorated 14 lbs wt gain.   add onglyza   Her updated medication list for this problem includes:    Glucophage 1000 Mg Tabs (Metformin hcl) ..... One by mouth two times a day    Baby Aspirin 81 Mg Chew (Aspirin) ..... One by mouth once daily    Benicar 20 Mg Tabs (Olmesartan medoxomil) .Marland Kitchen... 1 by mouth qd    Onglyza 5 Mg Tabs (Saxagliptin hcl) ..... One by mouth once daily  Labs Reviewed: Creat: 0.5 (01/06/2010)     Last Eye Exam: normal (06/29/2008) Reviewed HgBA1c results: 6.2 (01/06/2010)  5.6 (07/02/2009)  Problem # 2:  HYPERTENSION (ICD-401.9)  Her updated medication list for this problem includes:    Benicar 20 Mg Tabs (Olmesartan medoxomil) .Marland Kitchen... 1 by mouth qd  BP today: 126/100 Prior BP: 128/90 (07/09/2009)  Prior 10 Yr Risk Heart Disease: 9 % (01/09/2008)  Labs Reviewed: K+: 4.3 (01/06/2010) Creat: : 0.5 (01/06/2010)   Chol: 105 (07/17/2008)   HDL: 43.40 (07/17/2008)   LDL: 40 (07/17/2008)   TG: 106.0 (07/17/2008)  Problem # 3:  HYPERLIPIDEMIA (ICD-272.4)  Her updated medication list for this problem includes:    Crestor 10 Mg Tabs (Rosuvastatin calcium) .Marland Kitchen... 1 tab by mouth at bedtime  Labs Reviewed: SGOT: 15 (07/17/2008)   SGPT: 16 (07/17/2008)  Lipid Goals: Chol Goal: 200 (01/09/2008)   HDL Goal: 40 (01/09/2008)   LDL Goal: 100 (01/09/2008)   TG Goal: 150 (01/09/2008)  Prior 10 Yr Risk Heart Disease: 9 % (01/09/2008)   HDL:43.40 (07/17/2008), 51.2 (01/09/2008)  LDL:40 (07/17/2008), DEL (40/98/1191)  Chol:105 (07/17/2008), 140 (01/09/2008)  Trig:106.0 (07/17/2008), 214 (01/09/2008)  Complete Medication List: 1)  Glucophage  1000 Mg Tabs (Metformin hcl) .... One by mouth two times a day 2)  Baby Aspirin 81 Mg Chew (Aspirin) .... One by mouth once daily 3)  Onetouch Lancets Misc (Lancets) .... Useone lancet three times a day 4)  Benicar 20 Mg Tabs (Olmesartan medoxomil) .Marland Kitchen.. 1 by mouth qd 5)  Crestor 10 Mg Tabs (Rosuvastatin calcium) .Marland Kitchen.. 1 tab by mouth at bedtime 6)  Onetouch Ultra Test Strp (Glucose blood) .... Once daily testing 7)  Calcium 1500 Mg Tabs (Calcium carbonate) .... Once daily 8)  Vitamin D 1000 Unit Tabs (Cholecalciferol) .... Once daily 9)  Synthroid 200 Mcg Tabs (Levothyroxine sodium) .... Take 1 tablet by mouth once a day 10)  Onglyza 5 Mg Tabs (Saxagliptin hcl) .... One by mouth once daily  Patient Instructions: 1)  Please schedule a follow-up appointment in 3 months. 2)  BMP prior to visit, ICD-9: 401.9 3)  HbgA1C prior to visit, ICD-9: 250.00 4)  Urine Microalbumin prior to visit, ICD-9: 250.00 5)  Please return for lab work one (1) week before your next appointment.  Prescriptions: ONGLYZA 5 MG TABS (SAXAGLIPTIN HCL) one by mouth once daily  #90 x 1   Entered and Authorized by:   D. Thomos Lemons DO   Signed by:   D. Thomos Lemons DO on 01/10/2010   Method used:   Electronically to        [facility]* (retail)       [address]       [address] Shipping/mailing       [place], [place]  [address]       Ph: [phone]       Fax: [phone]   RxID:   [id number] RELION PEN NEEDLES 31G X 8 MM MISC (INSULIN PEN NEEDLE) use once daily as directed  #100 x 3   Entered and Authorized by:   D. Thomos Lemons DO   Signed by:   D. Thomos Lemons DO on 01/10/2010   Method used:   Print then Give to Patient   RxID:   [id number] VICTOZA 18 MG/3ML SOLN (LIRAGLUTIDE) inject 1.2 mg once daily  #1 month x 3   Entered and Authorized by:   D. Thomos Lemons DO   Signed by:   D. Thomos Lemons DO on 01/10/2010   Method used:   Print then Give to Patient   RxID:    [id number]    Orders Added: 1)  Est. Patient Level III [30160]   Immunization History:  Influenza Immunization History:    Influenza:  historical (11/01/2009)   Immunization History:  Influenza Immunization History:    Influenza:  Historical (11/01/2009)    Preventive Care  Screening  Pap Smear:    Date:  11/30/2009    Results:  normal   Last Flu Shot:    Date:  11/01/2009    Results:  Historical    Current Allergies (reviewed today): ! ACE INHIBITORS

## 2010-02-25 ENCOUNTER — Inpatient Hospital Stay (INDEPENDENT_AMBULATORY_CARE_PROVIDER_SITE_OTHER)
Admission: RE | Admit: 2010-02-25 | Discharge: 2010-02-25 | Disposition: A | Discharge status: Home / Self Care | Payer: Commercial Managed Care - PPO | Source: Ambulatory Visit | Attending: Family Medicine | Admitting: Family Medicine

## 2010-02-25 DIAGNOSIS — K219 Gastro-esophageal reflux disease without esophagitis: Secondary | ICD-10-CM

## 2010-03-10 NOTE — Letter (Signed)
[facility]: [facility]   Imported By: Lanelle Bal 03/04/2010 09:59:34  _____________________________________________________________________  External Attachment:    Type:   Image     Comment:   External Document

## 2010-04-05 LAB — POCT RAPID STREP A (OFFICE): Streptococcus, Group A Screen (Direct): NEGATIVE

## 2010-04-14 LAB — URINALYSIS, ROUTINE W REFLEX MICROSCOPIC
Hgb urine dipstick: NEGATIVE
Specific Gravity, Urine: 1.024 (ref 1.005–1.030)
Urobilinogen, UA: 1 mg/dL (ref 0.0–1.0)

## 2010-04-14 LAB — GLUCOSE, CAPILLARY
Glucose-Capillary: 125 mg/dL — ABNORMAL HIGH (ref 70–99)
Glucose-Capillary: 127 mg/dL — ABNORMAL HIGH (ref 70–99)
Glucose-Capillary: 147 mg/dL — ABNORMAL HIGH (ref 70–99)

## 2010-04-14 LAB — DIFFERENTIAL
Basophils Absolute: 0 10*3/uL (ref 0.0–0.1)
Lymphocytes Relative: 21 % (ref 12–46)
Lymphs Abs: 1.8 10*3/uL (ref 0.7–4.0)
Monocytes Absolute: 0.5 10*3/uL (ref 0.1–1.0)
Monocytes Relative: 5 % (ref 3–12)
Neutro Abs: 6.1 10*3/uL (ref 1.7–7.7)

## 2010-04-14 LAB — BASIC METABOLIC PANEL
Calcium: 9.2 mg/dL (ref 8.4–10.5)
GFR calc Af Amer: >60 mL/min (ref >60)
GFR calc non Af Amer: >60 mL/min (ref >60)
Potassium: 4.7 mEq/L (ref 3.5–5.1)
Sodium: 139 mEq/L (ref 135–145)

## 2010-04-14 LAB — CBC
Hemoglobin: 12.8 g/dL (ref 12.0–15.0)
RBC: 4.48 MIL/uL (ref 3.87–5.11)

## 2010-04-14 LAB — URINE MICROSCOPIC-ADD ON

## 2010-04-14 LAB — CALCIUM: Calcium: 8.9 mg/dL (ref 8.4–10.5)

## 2010-04-27 ENCOUNTER — Other Ambulatory Visit: Payer: Self-pay | Admitting: Internal Medicine

## 2010-06-07 NOTE — Procedures (Signed)
Stacey Roberts, ORBACH NO.:  [id number]   MEDICAL RECORD NO.:  [id number]          PATIENT TYPE:  OUT   LOCATION:  SLEEP CENTER                 FACILITY:  [facility]   PHYSICIAN:  Barbaraann Share, MD,FCCPDATE OF BIRTH:  February 09[birth year] 1965   DATE OF STUDY:  08/21/2006                            NOCTURNAL POLYSOMNOGRAM   REFERRING PHYSICIAN:  Barbette Hair. Artist Pais, DO   LOCATION:  Sleep lab.   INDICATION FOR STUDY:  Hypersomnia with sleep apnea, Epworth Score:  12.   SLEEP ARCHITECTURE:  The patient had a total sleep time of 304 minutes  with very little slow-wave sleep in REM.  Sleep onset latency was  normal, and REM onset was very prolonged at 358 minutes.  Sleep  efficiency was decreased at 71%.   RESPIRATORY DATA:  The patient was found to have 422 hypopneas and 73  obstructed apneas for an apnea/hypopnea index of 98 events per events.  The events were not positional, and snoring was not quantified during  the study by the sleep technician.   OXYGEN DATA:  There was O2 saturation as low as 61% with the patient's  obstructed events.   CARDIAC DATA:  No clinically significant cardia arrhythmias were noted.   MOVEMENTS/PARASOMNIA:  Small numbers of leg jerks without clinical  significance.   IMPRESSION/RECOMMENDATION:  Severe obstructive sleep apnea with an  apnea/hypopnea index of 98 events per hour and O2 saturation as well as  low as 61%.  Treatment of this degree of sleep apnea should focus  primarily on weight loss, if applicable, as well as CPAP.      Barbaraann Share, MD,FCCP  Diplomate, American Board of Sleep  Medicine  Electronically Signed     [facility]/MED[facility]  D:  09/05/2006 16:36:10  T:  09/06/2006 22:02:07  Job:  [id number]

## 2010-06-10 NOTE — Discharge Summary (Signed)
NAMELORILEE, CAFARELLA NO.:  [id number]   MEDICAL RECORD NO.:  [id number]          PATIENT TYPE:  INP   LOCATION:  3733                         FACILITY:  [facility]   PHYSICIAN:  Marcellus Scott, MD     DATE OF BIRTH:  10-Jun-1965   DATE OF ADMISSION:  03/30/2006  DATE OF DISCHARGE:  04/01/2006                               DISCHARGE SUMMARY   PRIMARY CARE PHYSICIAN:  The patient is unassigned to the [facility]  health system. I have provided patient with Dr. Shon Baton office  number who may be willing to accept this patient as a new patient.  She  is to call for an appointment, if she wishes to see him.   DISCHARGE DIAGNOSES:  1. Uncontrolled diabetes.  2. Hypertension.  3. Dyslipidemia.  4. Tobacco abuse.  5. Back pain.  6. Rule out obstructive sleep apnea syndrome.  7. Metabolic syndrome.  8. Obesity.   DISCHARGE MEDICATIONS:  1. Enteric coated aspirin 81 mg p.o. daily.  2. Metformin 500 mg p.o. b.i.d.  3. Lisinopril 10 mg p.o. daily.  4. Hydrochlorothiazide 12.5 mg p.o. daily.  5. Lantus insulin 10 units subcutaneously every evening.  6. Tylenol  650 mg p.o. q. 6 hourly p.r.n.   PROCEDURES:  Chest x-ray on March 30, 2006 with no acute findings.   PERTINENT LABORATORY DATA:  CBC:  Hemoglobin 13.6, hematocrit 39.7,  white blood cells 9.0, MCV 82.2, platelets of 245, D-dimer less than  0.22, basic metabolic panel showed sodium 138, potassium 4.2, chloride  100, bicarb 28, glucose 225, BUN 8, creatinine 0.57.  Hepatic panel only  remarkable for an albumin of 3.1.  Cardiac enzymes cycled times 4  negative.  Lipid panel:  Cholesterol 201, triglyceride 306, HDL 35, LDL  105, VLDL 61.  Urinalysis negative for proteins or features of urinary  tract infection.  BNP of less than 30.   HOSPITAL COURSE/DISPOSITION:  For details of the initial part of the  admission, please refer to the history and physical done on March 30, 2006. In Summary Ms. Bahena is a  45 year old pleasant Caucasian female  patient with a history of obesity who presented to the emergency room  with left-sided back pain, dizziness, light-headedness, polyuria,  polydipsia.  On further evaluation in the emergency room, she was noted  to have markedly elevated blood sugars and blood pressures. There was a  significant history of coronary artery disease in her mom at a very  young age, and hence wanted to rule out an atypical presentation of an  myocardial infarction with this back pain.  The patient was admitted for  further evaluation and management.   1. Left-sided back pain on the medial aspect of the scapular wing.      The patient was admitted to the hospital to the telemetry bed.      Cardiac enzymes were cycled and were negative.  Telemetry was      unremarkable.  D-dimer was negative.  The patient's pain resolved      promptly on the same night of admission and has not recurred.  It      is thought that this might be a musculoskeletal type of pain      related to posture.  She is to use p.r.n. analgesics.  2. Uncontrolled diabetes mellitus.  The patient has had a history of      polyuria and polydipsia for years, however, she has never been told      to be diabetic.  The patient's blood sugars on admission were 391      but she was not ketoacidotic.  She was admitted to the hospital and      placed on IV fluid hydration, Lantus and sliding scale insulin.      Subsequently,  metformin was also added to her regimen.  Her blood      sugars are better controlled though still suboptimal  Her A1c was      requested however the lab has not processed them.  I have called      the lab and the sample has been resent for processing, this result      is to be followed up as an outpatient. The patient is to continue      her Lantus insulin and metformin as an outpatient.  The doses      obviously will have to be titrated to tightly control her blood      sugars.  The patient  has received diabetic education.  The patient      has been instructed regarding frequent blood sugar checks at home.      She has also been educated regarding hypoglycemic symptoms and      management.  3. Hypertension, the patient also came in with a markedly elevated      blood pressure of 176/115 in the emergency room.  She was placed on      lisinopril.  Her blood pressures are still suboptimally controlled.      Will add low dose of Hydrochlorothiazide considering that the      patient also has some pedal edema.  Consider work up for secondary      causes of hypertension as an outpatient.  Also, the patient has      been instructed regarding weight loss which will help in reducing      the blood pressure.  4. Dyslipidemia, the patient's lipid profile is as indicated above.      However, these lipids are in the context of an uncontrolled      diabetes.  Suggest repeating a lipid panel in a couple of weeks      once the blood sugar is adequately controlled on the medications      and dieting and consider starting a lipid lowering agent if      necessary.  5. Metabolic syndrome.  The patient satisfies the criteria for      metabolic syndrome and management to correct individual problems as      indicated above.  6. Obesity.  The patient has been instructed regarding dieting/weight      loss which she claims that she is going to start right away.  7. Rule out obstructive sleep apnea syndrome.  The patient has a      history of snoring, however, there is no history of apneic spells.      She does say that at times she feels drowsy in the day time at work      or while driving, but has never been in any accident.  Consider  evaluation for obstructive sleep apnea syndrome including a sleep      study as an outpatient.  8. Tobacco abuse, patient has been counseled regarding tobacco      cessation and she says that she is going to quit right away as      well.     Marcellus Scott,  MD  Electronically Signed     AH/MEDQ  D:  04/01/2006  T:  04/01/2006  Job:  [id number]   cc:   Lonia Blood, M.D.

## 2010-06-10 NOTE — H&P (Signed)
NAME:  Stacey Roberts, PAYMENT NO.:  [id number]   MEDICAL RECORD NO.:  [id number]          PATIENT TYPE:  EMS   LOCATION:  MINO                         FACILITY:  [facility]   PHYSICIAN:  Marcellus Scott, MD     DATE OF BIRTH:  1965/11/15   DATE OF ADMISSION:  03/30/2006  DATE OF DISCHARGE:                              HISTORY & PHYSICAL   PRIMARY CARE PHYSICIAN:  Patient is unassigned to the [facility] health  system.   CHIEF COMPLAINT:  1. Left-sided back pain.  2. Dizzy, and lightheadedness.   HISTORY OF PRESENT ILLNESS:  The patient is a 45 year old pleasant  Caucasian female patient with no significant past medical history.  She  was in her usual state of health until 4 days ago.  She went to bed  Sunday night with no complaints, and she woke up on Monday morning,  again, with no complaints.  However, while she was getting ready to go  to work, she noticed a gradual onset of left-sided back pain just  medical to the scapular wing.  This was a sharp, non-radiating kind of  pain, 5-6/10 in severity with no associated chest pain, dyspnea,  wheezing, chest tightness, lightheadedness or dizziness at that time.  The patient proceeded to come to work, and took 2 full-strength aspirins  and then subsequently took 2 Tylenols.  By the next morning, the pain  had gone.  The patient says the night prior to the event she might have  slept in an odd position with her bed more reclined than usual.  She has  had a prior history of stiff neck and lower back pains. She was pain  free on Tuesday and Wednesday.  However, on Thursday morning, again, she  had mild back pain in the morning and then, while doing some cleaning  activity, she noticed worsening of the pain.  However, over the next 24  hours, the pain subsided to a 2-3/10 in severity.  Today, while at work,  she felt lightheaded and dizzy, went to use the restroom, and continued  to feel worse, following which she went to  [employer] and then was  brought to the emergency room for further evaluation.  In the emergency  room, she was noted to have a markedly elevated blood sugar as well as  high blood pressure.  Also, with her family history of MI at a young age  and this back pain, the emergency room physician was concerned to rule  out an acute coronary syndrome.  The patient, hence, is being admitted  for further evaluation and management.  The patient has received a full-  dose aspirin as well a Lopressor for her elevated blood pressure in the  ER.   PAST MEDICAL HISTORY:  1. Episodes of acute bronchitis.  2. Obesity.  3. Psoriasis of the scalp.   PAST SURGICAL HISTORY:  The patient had a D&C 2 years ago.   ALLERGIES:  No known allergies.   MEDICATIONS:  None.   FAMILY HISTORY:  1. Mother had her first MI at the age of 9 years.  She demised at the      age of 24 of a questionable MI.  2. Mother also with a history of Graves' disease.  3. Father with a history of dementia, emphysema and bipolar disorder,      and also demised.  4. Sister with a history of psoriasis.   SOCIAL HISTORY:  The patient is divorced.  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  The patient smokes 7-8  cigarettes per day for the last 25 years.  There is no history of  alcohol or drug abuse.   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  No earache, sore throat, difficulty swallowing, visual  disturbances.  GENERAL:  No fevers, chills, rigors or sweats.  RESPIRATORY:  Patient with a dry smoker's cough, but no wheezing or  chest tightness.  Patient with dyspnea on exertion, more so in the last  week.  CARDIOVASCULAR:  Patient with no anginal type of chest pain.  No  orthopnea or PND.  The patient claims her legs swell up when she eats a  salty diet.  Occasional skipped beats.   ABDOMEN/GI:  No nausea,  vomiting, abdominal pain, constipation or diarrhea.  Patient with  polyuria and polydipsia, and passes urine 4-5 times in the daytime and  about 4 times at night.  GENITOURINARY:  With no dysuria, but polyuria.  No urgency. CNS:  With no asymmetrical limb weakness, numbness, change  of speech, twisting of the face.  EXTREMITIES:  With intermittent ankle  swelling, and intermittent pain of the small joints of the hands.  SKIN:  Without any rashes.   PHYSICAL EXAMINATION:  GENERAL:  The patient is a moderately built and  obese female in no obvious cardiopulmonary, painful distress.  VITAL SIGNS:  Temperature of 98.0 degrees Fahrenheit, blood pressure of  176/115 on arrival to the emergency room which is now 174/96, pulse of  95 per minute, respirations of 24 per minute, saturating at 98% on room  air.  HEAD, EYES, ENT:  Nontraumatic, normocephalic.  Pupils are equally  reacting to light and accommodation.  No pharyngeal erythema.  Mucosa  mildly dry.  NECK:  Thick.  No JVD, carotid bruit, lymphadenopathy or goiter. Supple.  General:  Without lymphadenopathy.  RESPIRATORY:  Clear to auscultation bilaterally.  CARDIOVASCULAR:  First and second heart sounds are heard.  No third or  fourth sounds.  No murmurs, rubs, gallops or clicks.  ABDOMEN:  Obese, nontender.  No organomegaly or mass appreciated.  Bowel  sounds are heard normally.  NEUROLOGIC:  The patient is awake, alert, oriented x 3 with no focal  neurological deficits.  EXTREMITIES:  With 1+ ankle edema bilaterally.  SKIN:  Without any rashes.  MUSCULOSKELETAL:  With no spine or paraspinal tenderness or any  tenderness anywhere on the back.   LABORATORY DATA:  Hemoglobin of 16.3, hematocrit of 48.  D-dimer of less  than 0.22.  Basic metabolic panel with a sodium of 132, potassium of  4.4, chloride of 99, glucose of 391, BUN of 10, creatinine of 0.5. Point-of-care cardiac markers x 2 are negative.  Chest  x-ray has been  reported and reviewed by me as no active cardiopulmonary disease.  EKG  is a normal sinus rhythm at 94 beats per minute with normal axis with no  acute ischemic changes.   ASSESSMENT AND PLAN:  1.  Left-sided back pain, seems musculoskeletal in nature, currently      resolved.  We will place the patient on p.r.n. analgesics, and      monitor closely.  We will admit the patient to telemetry, and cycle      the patient's cardiac enzymes x 3 for an atypical presentation of      acute coronary syndrome.  2. Uncontrolled diabetes.  We will admit the patient.  We will check      the patient's hemoglobin A1c, fasting lipid profile. We will obtain      diabetes education, and we will place the patient on IV saline      hydration and sliding-scale insulin.  Patient is not keen on      maintainence insulin, and to consider to starting on oral      hypoglycemic agents in the a.m.  3. Newly-diagnosed hypertension.  Plan is for ACE inhibitor.  4. Obesity.  Plan is for diet, exercise, weight loss and a dietician      consult.  5. To rule out obstructive sleep apnea as an outpatient.  6. Tobacco abuse.  To obtain tobacco-cessation counseling.      Marcellus Scott, MD  Electronically Signed     AH/MEDQ  D:  03/30/2006  T:  03/30/2006  Job:  [id number]   cc:   Lonia Blood, M.D.

## 2010-06-10 NOTE — Assessment & Plan Note (Signed)
[facility]                           PRIMARY CARE OFFICE NOTE   NAME:Stacey Roberts, Stacey Roberts                      MRN:          [id number]  DATE:05/28/2006                            DOB:          1965-06-26    CHIEF COMPLAINT:  New patient to practice.   HISTORY OF PRESENT ILLNESS:  The patient is a 45 year old white female  here to establish primary care.  Her medical history is significant for  recently diagnosed, new onset type 2 diabetes.  She was hospitalized in  early March of 2008 secondary to complaints of left-sided back pain,  dizziness, and lightheadedness.  Upon hospitalization, the patient was  found to have an A1c of 11.2 and symptoms promptly improved after  correction of her severe hyperglycemia.   Since hospital discharge, the patient's blood sugars have significantly  improved.  She has followed up with the diabetic educator and followup  A1c was in the low 7's from 11.2.  She also was found to be hypertensive  during hospitalization and was placed on 10 mg of lisinopril and 25 mg  of hydrochlorothiazide.  She notes dry, hacking cough that has not gone  away since starting lisinopril.   She had 3 negative cardiac enzymes while she was hospitalized.  She  denies any symptoms of chest pain or chest heaviness with exertion.  She  does have a fairly family history of coronary artery disease.  Her  mother died at age 46 secondary to myocardial infarction.  The patient  has also other family members known to have heart disease.   Since starting her regimen of metformin and 15 units of Lantus, the  patient denies any hypoglycemic episodes.  Her a.m. sugars have ranged  from 130's to occasional readings in the 160's.  She has tried her best  to follow a diabetic/ carbo modified diet.  She has trouble managing her  appetite especially on weekends.   She denies any numbness and tingling of her hands or feet, no symptoms  of claudication.  She  has not seen an eye doctor since being diagnosed  with type 2 diabetes.   PAST MEDICAL HISTORY:  1. Uncontrolled diabetes.  2. Hypertension.  3. History of tobacco abuse.  4. History of migraines.  5. Status post breast biopsy 2003.  6. Tonsillectomy in 1982.  7. Allergic rhinitis.  8. Status post D&C 2004.   CURRENT MEDICATIONS:  1. Lisinopril 10 mg once a day.  2. Metformin 500 mg b.i.d.  3. Hydrochlorothiazide 25 a day.  4. Lantus 15 units at bedtime.  5. Baby aspirin 81 mg a day.   ALLERGIES:  No known drug allergies.   SOCIAL HISTORY:  The patient is divorced, moved to [place] in  2006 from [place].  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   FAMILY HISTORY:  As noted above.  Father deceased at age 30 secondary to  dementia.  He had bipolar disorder and also had emphysema.   HABITS:  No  alcohol.  Tobacco use:  She quit 1 year ago.  She smoked on  and off for approximately 20 years.  No history of recreational drug  use.   REVIEW OF SYSTEMS:  No HEENT symptoms.  Occasional heartburn.  No  shortness of breath.  Positive dry hacking cough.  All other systems  negative.   PHYSICAL EXAMINATION:  VITALS:  Weight is 272 pounds, temperature is  97.4, pulse is 88, blood pressure is 126/83 in the left arm in a seated  position.  IN GENERAL:  The patient is a very pleasant, somewhat obese 45 year old  Caucasian female in no apparent distress.  HEENT:  Normocephalic and atraumatic.  Pupils are equal and reactive to  light bilaterally.  Extraocular movements are intact.  The patient was  anicteric.  Conjunctiva was within normal limits. External auditory  canals and TM unremarkable.  Oropharynx exam unremarkable.  NECK:  Somewhat thick but no adenopathy, carotid bruit, or thyromegaly.  CHEST:  Normal expiratory effort.  Clear to auscultation bilaterally, no  rhonchi, rales or  wheezing.  CARDIOVASCULAR:  Regular rate and rhythm, no significant murmurs, rubs  or gallops appreciated.  ABDOMEN:  Protuberant but not tender, positive bowel sounds, no  organomegaly.  MUSCULOSKELETAL:  No cyanosis, clubbing or edema.  The patient had  diminished palpable pedal dorsalis pulses in her feet, had intact  sensations to temperature and vibration.  NEUROLOGY:  Cranial nerves II-XII grossly intact.  She was non focal.   IMPRESSION/RECOMMENDATIONS:  1. Type 2 diabetes, uncontrolled.  2. ACE inhibitor cough.  3. Hypertension.  4. Hyperlipidemia.  5. History of tobacco abuse.  6. Health maintenance.   RECOMMENDATIONS:  We will discontinue her lisinopril and  hydrochlorothiazide.  She was given samples of  Benicar/hydrochlorothiazide 20/12.5.  She is to start at 1/2 pill a day  and monitor her blood pressure at home.  May need a full dose.   We discussed the macro and micro vascular complications associated with  diabetes and despite her last LDL being reported at 116, we started her  on Crestor 5 mg a day, with a goal LDL of 70 or less.   In terms of her diabetes, she seems to have made significant changes to  her lifestyle.  We will increase her metformin to 850 twice a day.  Ultimately, I would like to wean the patient off Lantus.  We briefly  discussed possible use of Byetta.   We will repeat her lipid studies and LFTs and an A1c, followup in 6  weeks.  She will be referred to [facility] for routine  diabetic eye exam.     Barbette Hair. Artist Pais, DO  Electronically Signed    RDY/MedQ  DD: 05/28/2006  DT: 05/28/2006  Job #: [id number]

## 2010-06-16 ENCOUNTER — Other Ambulatory Visit (HOSPITAL_COMMUNITY): Payer: Self-pay | Admitting: Internal Medicine

## 2010-06-16 DIAGNOSIS — C73 Malignant neoplasm of thyroid gland: Secondary | ICD-10-CM

## 2010-06-29 ENCOUNTER — Encounter: Payer: Self-pay | Admitting: Internal Medicine

## 2010-06-30 ENCOUNTER — Other Ambulatory Visit (INDEPENDENT_AMBULATORY_CARE_PROVIDER_SITE_OTHER): Payer: Commercial Managed Care - PPO

## 2010-06-30 ENCOUNTER — Other Ambulatory Visit: Payer: Commercial Managed Care - PPO

## 2010-06-30 DIAGNOSIS — E119 Type 2 diabetes mellitus without complications: Secondary | ICD-10-CM

## 2010-06-30 DIAGNOSIS — I10 Essential (primary) hypertension: Secondary | ICD-10-CM

## 2010-06-30 LAB — BASIC METABOLIC PANEL
CO2: 28 mEq/L (ref 19–32)
Calcium: 9.2 mg/dL (ref 8.4–10.5)
Glucose, Bld: 159 mg/dL — ABNORMAL HIGH (ref 70–99)
Sodium: 138 mEq/L (ref 135–145)

## 2010-06-30 LAB — MICROALBUMIN / CREATININE URINE RATIO
Creatinine,U: 139.4 mg/dL
Microalb Creat Ratio: 0.4 mg/g (ref 0.0–30.0)

## 2010-07-04 ENCOUNTER — Ambulatory Visit (INDEPENDENT_AMBULATORY_CARE_PROVIDER_SITE_OTHER): Payer: Commercial Managed Care - PPO | Admitting: Family

## 2010-07-04 ENCOUNTER — Ambulatory Visit: Payer: Self-pay | Admitting: Internal Medicine

## 2010-07-04 ENCOUNTER — Encounter: Payer: Self-pay | Admitting: Family

## 2010-07-04 DIAGNOSIS — R635 Abnormal weight gain: Secondary | ICD-10-CM

## 2010-07-04 DIAGNOSIS — C73 Malignant neoplasm of thyroid gland: Secondary | ICD-10-CM

## 2010-07-04 DIAGNOSIS — E119 Type 2 diabetes mellitus without complications: Secondary | ICD-10-CM

## 2010-07-04 MED ORDER — METFORMIN HCL 1000 MG PO TABS: 1000.0000 mg | ORAL_TABLET | Freq: Two times a day (BID) | ORAL | Status: DC

## 2010-07-04 NOTE — Patient Instructions (Signed)
Please follow up with Dr. Artist Pais in 3 months. Complete your blood work 1 week prior.

## 2010-07-04 NOTE — Progress Notes (Signed)
Subjective:    Patient ID: Stacey Roberts, female    DOB: 1965-08-15, 45 y.o.   MRN: [id number]  HPI  DM2- using metformin and januvia. Denies symptomatic hypoglycemia. Last eye exam was <1 year ago.  Hyperlipidemia- on crestor.     Weight gain- Reports 45 pound weight gain in the last year.  Hypothyroid- being followed by Dr. Sharl Ma.  Reports that they have been purposely treating her TSH down to close to zero.   Review of Systems    see HPI  Past Medical History  Diagnosis Date  . Diabetes mellitus, type II   . Obesity   . Hypertension   . Hyperlipidemia   . 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 Sharl Ma    History   Social History  . Marital Status: Divorced    Spouse Name: N/A    Number of Children: N/A  . Years of Education: N/A   Occupational History  . Not on file.   Social History Main Topics  . Smoking status: Former Games developer  . Smokeless tobacco: Not on file   Comment: smoked on and off for 20 years  . Alcohol Use: No  . Drug Use: No  . Sexually Active: Not on file   Other Topics Concern  . Not on file   Social History Narrative   The patient is divorced, moved to [place] from [place].  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  Tobacco use:  She quit 1 year ago.  She smoked onand off for approximately 20 years.  No history of recreational druguse.    Past Surgical History  Procedure Date  . Breast biopsy 2003    s/p  . Tonsillectomy 1982  . Dilation and curettage of uterus 2004    s/p  . Thyroidectomy 02/2009    Dr Gerrit Friends    Family History  Problem Relation Age of Onset  . Dementia Father     deceased age 26 secondary to dementia  . Bipolar disorder Father   . Emphysema Father     Allergies  Allergen Reactions  . Penicillins  Shortness Of Breath and Rash  . Ace Inhibitors     REACTION: cough    Current Outpatient Prescriptions on File Prior to Visit  Medication Sig Dispense Refill  . aspirin 81 MG tablet Take 81 mg by mouth daily.        . Calcium Carbonate 1500 MG TABS Take 1,500 mg by mouth daily.        . cholecalciferol (VITAMIN D) 1000 UNITS tablet Take 1,000 Units by mouth daily.        Marland Kitchen levothyroxine (SYNTHROID, LEVOTHROID) 200 MCG tablet Take 200 mcg by mouth daily.        Marland Kitchen olmesartan (BENICAR) 20 MG tablet Take 20 mg by mouth daily.        . ONE TOUCH LANCETS MISC Use to check blood sugar three time a day       . rosuvastatin (CRESTOR) 10 MG tablet Take 10 mg by mouth at bedtime.        . sitaGLIPtan (JANUVIA) 100 MG tablet Take 100 mg by mouth daily.        Marland Kitchen glucose blood (ONE TOUCH TEST STRIPS) test strip Use to check blood sugar three times a day  BP 140/80  Pulse 78  Temp(Src) 97.8 F (36.6 C) (Oral)  Resp 18  Ht 5\' 4"  (1.626 m)  Wt 250 lb 1.9 oz (113.454 kg)  BMI 42.93 kg/m2  LMP 06/07/2010    Objective:   Physical Exam  Constitutional: She appears well-developed and well-nourished.  Cardiovascular: Normal rate and regular rhythm.   Pulmonary/Chest: Effort normal and breath sounds normal.  Musculoskeletal: She exhibits no edema.            Assessment & Plan:

## 2010-07-05 ENCOUNTER — Ambulatory Visit: Payer: Commercial Managed Care - PPO | Admitting: Family

## 2010-07-11 ENCOUNTER — Encounter (HOSPITAL_COMMUNITY)
Admission: RE | Admit: 2010-07-11 | Discharge: 2010-07-11 | Disposition: A | Discharge status: Home / Self Care | Payer: Commercial Managed Care - PPO | Source: Ambulatory Visit | Attending: Internal Medicine | Admitting: Internal Medicine

## 2010-07-11 DIAGNOSIS — C73 Malignant neoplasm of thyroid gland: Secondary | ICD-10-CM | POA: Insufficient documentation

## 2010-07-12 ENCOUNTER — Telehealth: Payer: Self-pay | Admitting: Family

## 2010-07-12 ENCOUNTER — Ambulatory Visit (HOSPITAL_COMMUNITY): Admission: RE | Admit: 2010-07-12 | Payer: Commercial Managed Care - PPO | Source: Ambulatory Visit

## 2010-07-12 DIAGNOSIS — R635 Abnormal weight gain: Secondary | ICD-10-CM | POA: Insufficient documentation

## 2010-07-12 NOTE — Telephone Encounter (Signed)
Please call pt and let her know that I reviewed her chart and see that she is overdue for a fasting lipid panel and LFT's.  Please have her return fasting to the lab at her earliest convenience to complete.  Diagnosis code is 272.4

## 2010-07-12 NOTE — Assessment & Plan Note (Signed)
A1C is at goal at 6.5, eye exam is up to date.  Continue Januvia and Metformin.

## 2010-07-12 NOTE — Assessment & Plan Note (Signed)
We discussed diet, exercise and weight loss. 

## 2010-07-12 NOTE — Assessment & Plan Note (Signed)
Management per Endocrinology- Dr. Sharl Ma.

## 2010-07-13 ENCOUNTER — Encounter (HOSPITAL_COMMUNITY)
Admission: RE | Admit: 2010-07-13 | Discharge: 2010-07-13 | Disposition: A | Discharge status: Home / Self Care | Payer: Commercial Managed Care - PPO | Source: Ambulatory Visit | Attending: Internal Medicine | Admitting: Internal Medicine

## 2010-07-13 NOTE — Telephone Encounter (Signed)
Call placed to patient at [phone], she was advised per [facility]'s instructions. Patient declines to have blood work done in 3 months, but has agreed to follow up in 6 months and have blood work done at [facility]. [facility] placed for [facility] doe December 2012.

## 2010-07-15 ENCOUNTER — Encounter (HOSPITAL_COMMUNITY): Payer: Commercial Managed Care - PPO

## 2010-07-15 MED ORDER — SODIUM IODIDE I 131 CAPSULE
4.0000 | Freq: Once | INTRAVENOUS | Status: AC | PRN
  Administered 2010-07-15: 4 via ORAL

## 2010-09-27 ENCOUNTER — Other Ambulatory Visit: Payer: Self-pay | Admitting: Internal Medicine

## 2010-09-28 ENCOUNTER — Telehealth: Payer: Self-pay | Admitting: Internal Medicine

## 2010-09-28 NOTE — Telephone Encounter (Signed)
Patient states that she needs a refill on januvia. Please send to [facility] outpt pharmacy

## 2010-09-28 NOTE — Telephone Encounter (Signed)
Refill was sent to pharmacy yesterday and pt was notified.

## 2010-12-07 ENCOUNTER — Other Ambulatory Visit: Payer: Self-pay | Admitting: Internal Medicine

## 2010-12-20 ENCOUNTER — Other Ambulatory Visit: Payer: Self-pay | Admitting: Internal Medicine

## 2010-12-20 DIAGNOSIS — C73 Malignant neoplasm of thyroid gland: Secondary | ICD-10-CM

## 2010-12-28 ENCOUNTER — Other Ambulatory Visit: Payer: Self-pay | Admitting: Internal Medicine

## 2010-12-28 DIAGNOSIS — Z1231 Encounter for screening mammogram for malignant neoplasm of breast: Secondary | ICD-10-CM

## 2010-12-29 ENCOUNTER — Ambulatory Visit (HOSPITAL_COMMUNITY)
Admission: RE | Admit: 2010-12-29 | Discharge: 2010-12-29 | Disposition: A | Discharge status: Home / Self Care | Payer: 59 | Source: Ambulatory Visit | Attending: Internal Medicine | Admitting: Internal Medicine

## 2010-12-29 DIAGNOSIS — R221 Localized swelling, mass and lump, neck: Secondary | ICD-10-CM | POA: Insufficient documentation

## 2010-12-29 DIAGNOSIS — R599 Enlarged lymph nodes, unspecified: Secondary | ICD-10-CM | POA: Insufficient documentation

## 2010-12-29 DIAGNOSIS — R220 Localized swelling, mass and lump, head: Secondary | ICD-10-CM | POA: Insufficient documentation

## 2010-12-29 DIAGNOSIS — C73 Malignant neoplasm of thyroid gland: Secondary | ICD-10-CM

## 2011-01-02 ENCOUNTER — Other Ambulatory Visit: Payer: Commercial Managed Care - PPO

## 2011-01-06 ENCOUNTER — Encounter: Payer: Self-pay | Admitting: Internal Medicine

## 2011-01-06 ENCOUNTER — Ambulatory Visit (INDEPENDENT_AMBULATORY_CARE_PROVIDER_SITE_OTHER): Payer: Commercial Managed Care - PPO | Admitting: Internal Medicine

## 2011-01-06 DIAGNOSIS — I10 Essential (primary) hypertension: Secondary | ICD-10-CM

## 2011-01-06 DIAGNOSIS — E119 Type 2 diabetes mellitus without complications: Secondary | ICD-10-CM

## 2011-01-06 NOTE — Assessment & Plan Note (Signed)
Stable.  Continue benicar.  BP: 132/84 mmHg  Lab Results  Component Value Date   CREATININE 0.8 06/30/2010

## 2011-01-06 NOTE — Progress Notes (Signed)
Subjective:    Patient ID: Stacey Roberts, female    DOB: 07-28-1965, 45 y.o.   MRN: [id number]  HPI  45 year old white female with history of type 2 diabetes, hyperlipidemia, hypertension and thyroid cancer for routine followup. Ever since her thyroid cancer diagnosis patient has "fallen off the wagon".  She has difficulty controlling her appetite especially for sweets. She has gained significant amount of weight. She is followed by nurse practitioner at [facility] and her [name] reported at 6.7 - 3 months ago.  Her husband is also diabetic and poorly compliant which does not help.  Review of Systems Positive for weight gain. Negative for chest pain.  Intermittent knee pain.  Possible depression  Past Medical History  Diagnosis Date  . Diabetes mellitus, type II   . Obesity   . Hypertension   . Hyperlipidemia   . 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 Sharl Ma    History   Social History  . Marital Status: Divorced    Spouse Name: N/A    Number of Children: N/A  . Years of Education: N/A   Occupational History  . Not on file.   Social History Main Topics  . Smoking status: Former Games developer  . Smokeless tobacco: Not on file   Comment: smoked on and off for 20 years  . Alcohol Use: No  . Drug Use: No  . Sexually Active: Not on file   Other Topics Concern  . Not on file   Social History Narrative   The patient is divorced, moved to [place] from [place].  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  Tobacco use:  She quit 1 year ago.  She smoked onand off for approximately 20 years.  No history of recreational druguse.    Past Surgical History  Procedure Date  . Breast biopsy 2003    s/p  . Tonsillectomy 1982  . Dilation and curettage of uterus 2004    s/p    . Thyroidectomy 02/2009    Dr Gerrit Friends    Family History  Problem Relation Age of Onset  . Dementia Father     deceased age 79 secondary to dementia  . Bipolar disorder Father   . Emphysema Father     Allergies  Allergen Reactions  . Penicillins Shortness Of Breath and Rash  . Ace Inhibitors     REACTION: cough    Current Outpatient Prescriptions on File Prior to Visit  Medication Sig Dispense Refill  . aspirin 81 MG tablet Take 81 mg by mouth daily.        Marland Kitchen BENICAR 20 MG tablet TAKE 1 TABLET BY MOUTH EVERY DAY  90 tablet  3  . Calcium Carbonate 1500 MG TABS Take 1,500 mg by mouth daily.        . cholecalciferol (VITAMIN D) 1000 UNITS tablet Take 1,000 Units by mouth daily.        . CRESTOR 10 MG tablet TAKE 1 TABLET BY MOUTH AT BEDTIME  90 tablet  3  . glucose blood (ONE TOUCH TEST STRIPS) test strip Use to check blood sugar three times a day       . JANUVIA 100 MG tablet TAKE 1 TABLET BY MOUTH ONCE DAILY  90 tablet  1  . levothyroxine (SYNTHROID, LEVOTHROID) 200 MCG  tablet Take 200 mcg by mouth daily.        . metFORMIN (GLUCOPHAGE) 1000 MG tablet Take 1 tablet (1,000 mg total) by mouth 2 (two) times daily with a meal.  60 tablet  3  . ONE TOUCH LANCETS MISC Use to check blood sugar three time a day         BP 132/84  Pulse 84  Temp(Src) 98.5 F (36.9 C) (Oral)  Wt 263 lb (119.296 kg)      Objective:   Physical Exam   Constitutional: Appears well-developed and well-nourished. No distress.  Neck: Normal range of motion. Neck supple. No thyromegaly present. No carotid bruit Cardiovascular: Normal rate, regular rhythm and normal heart sounds.  Exam reveals no gallop and no friction rub.  No murmur heard. Pulmonary/Chest: Effort normal and breath sounds normal.  No wheezes. No rales.  Neurological: Alert. No cranial nerve deficit.  Skin: Skin is warm and dry.  Psychiatric: Normal mood and affect. Behavior is normal.      Assessment & Plan:

## 2011-01-06 NOTE — Assessment & Plan Note (Addendum)
Encouraged life style changes.  Continue metformin and januvia.  She is not willing to consider bariatric surgery. Depression from her diagnosis of thyroid cancer maybe contributing to her poor dietary compliance. Patient to consider trial of Wellbutrin therapy. A1c monitored through NP at [facility]. [name] reported at 6.7 by patient.  If continued weight gain, we discussed restarting [place].

## 2011-01-09 ENCOUNTER — Other Ambulatory Visit: Payer: Self-pay | Admitting: Family

## 2011-01-09 ENCOUNTER — Telehealth: Payer: Self-pay | Admitting: Internal Medicine

## 2011-01-09 MED ORDER — METFORMIN HCL 1000 MG PO TABS: 1000.0000 mg | ORAL_TABLET | Freq: Two times a day (BID) | ORAL | Status: DC

## 2011-01-09 NOTE — Telephone Encounter (Signed)
Pt called and is req refill of metFORMIN (GLUCOPHAGE) 1000 MG to [facility]. Pt will be out of med on Wednesday.

## 2011-01-09 NOTE — Telephone Encounter (Signed)
rx sent in electronically 

## 2011-01-09 NOTE — Telephone Encounter (Signed)
Left detailed message on pharmacy voicemail to call with name of medication to be refilled.

## 2011-01-10 NOTE — Telephone Encounter (Signed)
Spoke with Thayer Ohm at [facility] pharmacy, he states pt picked up metformin and januvia yesterday. Does not have other pending requests. Encounter will be closed.

## 2011-01-18 ENCOUNTER — Other Ambulatory Visit: Payer: Self-pay | Admitting: Gynecology

## 2011-01-23 ENCOUNTER — Ambulatory Visit
Admission: RE | Admit: 2011-01-23 | Discharge: 2011-01-23 | Disposition: A | Discharge status: Home / Self Care | Payer: Commercial Managed Care - PPO | Source: Ambulatory Visit | Attending: Internal Medicine | Admitting: Internal Medicine

## 2011-01-23 DIAGNOSIS — Z1231 Encounter for screening mammogram for malignant neoplasm of breast: Secondary | ICD-10-CM

## 2011-01-26 ENCOUNTER — Telehealth: Payer: Self-pay | Admitting: Internal Medicine

## 2011-01-26 DIAGNOSIS — M25511 Pain in right shoulder: Secondary | ICD-10-CM

## 2011-01-26 NOTE — Telephone Encounter (Signed)
Shoulder pain x2 wks, no injury, constant pain worse at night, when she elevates it, its better but otherwise its a achy feeling.  Pt works at [employer] and would like to get a shoulder xray

## 2011-01-26 NOTE — Telephone Encounter (Signed)
Pt requesting a R shoulder x-ray be ordered in epic. She is experiencing alot of pain in her R shoulder

## 2011-01-26 NOTE — Telephone Encounter (Signed)
Ok for order for right shoulder x ray.  I x ray is normal and pt still having pain, I suggest OV or if pt prefers, we can arrange ortho eval

## 2011-01-26 NOTE — Telephone Encounter (Signed)
Pt aware, order placed 

## 2011-01-27 ENCOUNTER — Ambulatory Visit (HOSPITAL_COMMUNITY)
Admission: RE | Admit: 2011-01-27 | Discharge: 2011-01-27 | Disposition: A | Discharge status: Home / Self Care | Payer: Commercial Managed Care - PPO | Source: Ambulatory Visit | Attending: Internal Medicine | Admitting: Internal Medicine

## 2011-01-27 DIAGNOSIS — M25511 Pain in right shoulder: Secondary | ICD-10-CM

## 2011-01-27 DIAGNOSIS — M25519 Pain in unspecified shoulder: Secondary | ICD-10-CM | POA: Insufficient documentation

## 2011-02-03 ENCOUNTER — Telehealth: Payer: Self-pay | Admitting: *Deleted

## 2011-02-03 DIAGNOSIS — M7551 Bursitis of right shoulder: Secondary | ICD-10-CM

## 2011-02-03 MED ORDER — TRAMADOL HCL 50 MG PO TABS: 50.0000 mg | ORAL_TABLET | Freq: Three times a day (TID) | ORAL | Status: AC | PRN

## 2011-02-03 NOTE — Telephone Encounter (Signed)
rx called in, pt aware 

## 2011-02-03 NOTE — Telephone Encounter (Signed)
Pt. Needs to speak to [facility] about her Ortho referral.  [phone]

## 2011-02-03 NOTE — Telephone Encounter (Signed)
Please advise pt not to take NSAIDs too often and not > 2 weeks.  Call in tramadol 50 mg  # 30 one po bid prn.  RF x1.  (she take a tylenol along with tramadol)

## 2011-02-03 NOTE — Telephone Encounter (Signed)
Camelia Eng is working on referral.  Can she have any pain medicine?  She is eating IBF like candy.  [facility]

## 2011-03-27 ENCOUNTER — Telehealth: Payer: Self-pay

## 2011-03-27 NOTE — Telephone Encounter (Signed)
Pt states that an rx was sent to her pharmacy for Tramadol but it did not help her pain.  Pt states she went to the Orthopedic doctor and had a cortisone injection and she is still in severe pain.  Pt states the orthopedic doctor gave her naproxen 500 mg but she thinks she took it too often because she was having stomach issues and had to stop the medication. Pt states she has no medication at all for her pain.  Pt would like to have an rx sent to pharmacy.  Pls advise.

## 2011-03-27 NOTE — Telephone Encounter (Signed)
Ok to call in generic vicodin 5/500  #30 one po bid prn for shoulder pain. RF x 1

## 2011-03-28 MED ORDER — HYDROCODONE-ACETAMINOPHEN 5-500 MG PO TABS: 1.0000 | ORAL_TABLET | Freq: Two times a day (BID) | ORAL | Status: DC | PRN

## 2011-03-28 NOTE — Telephone Encounter (Signed)
rx called in, pt aware 

## 2011-04-04 ENCOUNTER — Emergency Department (HOSPITAL_COMMUNITY)
Admission: EM | Admit: 2011-04-04 | Discharge: 2011-04-04 | Disposition: A | Discharge status: Nursing Home (Includes ICF) | Payer: 59 | Source: Home / Self Care | Attending: Emergency Medicine | Admitting: Emergency Medicine

## 2011-04-04 ENCOUNTER — Encounter (HOSPITAL_COMMUNITY): Payer: Self-pay | Admitting: Emergency Medicine

## 2011-04-04 ENCOUNTER — Other Ambulatory Visit: Payer: Self-pay

## 2011-04-04 ENCOUNTER — Emergency Department (HOSPITAL_COMMUNITY): Payer: 59

## 2011-04-04 ENCOUNTER — Emergency Department (HOSPITAL_COMMUNITY)
Admission: EM | Admit: 2011-04-04 | Discharge: 2011-04-04 | Disposition: A | Discharge status: Home / Self Care | Payer: 59 | Attending: Emergency Medicine | Admitting: Emergency Medicine

## 2011-04-04 DIAGNOSIS — E119 Type 2 diabetes mellitus without complications: Secondary | ICD-10-CM | POA: Insufficient documentation

## 2011-04-04 DIAGNOSIS — Z85850 Personal history of malignant neoplasm of thyroid: Secondary | ICD-10-CM | POA: Insufficient documentation

## 2011-04-04 DIAGNOSIS — R61 Generalized hyperhidrosis: Secondary | ICD-10-CM | POA: Insufficient documentation

## 2011-04-04 DIAGNOSIS — R05 Cough: Secondary | ICD-10-CM

## 2011-04-04 DIAGNOSIS — J029 Acute pharyngitis, unspecified: Secondary | ICD-10-CM | POA: Insufficient documentation

## 2011-04-04 DIAGNOSIS — R0789 Other chest pain: Secondary | ICD-10-CM | POA: Insufficient documentation

## 2011-04-04 DIAGNOSIS — E785 Hyperlipidemia, unspecified: Secondary | ICD-10-CM | POA: Insufficient documentation

## 2011-04-04 DIAGNOSIS — R062 Wheezing: Secondary | ICD-10-CM | POA: Insufficient documentation

## 2011-04-04 DIAGNOSIS — E669 Obesity, unspecified: Secondary | ICD-10-CM | POA: Insufficient documentation

## 2011-04-04 DIAGNOSIS — R079 Chest pain, unspecified: Secondary | ICD-10-CM

## 2011-04-04 DIAGNOSIS — I10 Essential (primary) hypertension: Secondary | ICD-10-CM | POA: Insufficient documentation

## 2011-04-04 DIAGNOSIS — R059 Cough, unspecified: Secondary | ICD-10-CM | POA: Insufficient documentation

## 2011-04-04 DIAGNOSIS — R0602 Shortness of breath: Secondary | ICD-10-CM | POA: Insufficient documentation

## 2011-04-04 LAB — COMPREHENSIVE METABOLIC PANEL WITH GFR
ALT: 18 U/L (ref 0–35)
AST: 13 U/L (ref 0–37)
Albumin: 3.4 g/dL — ABNORMAL LOW (ref 3.5–5.2)
Alkaline Phosphatase: 72 U/L (ref 39–117)
BUN: 13 mg/dL (ref 6–23)
CO2: 27 meq/L (ref 19–32)
Calcium: 9 mg/dL (ref 8.4–10.5)
Chloride: 101 meq/L (ref 96–112)
Creatinine, Ser: 0.64 mg/dL (ref 0.50–1.10)
GFR calc Af Amer: >90 mL/min
GFR calc non Af Amer: >90 mL/min
Glucose, Bld: 131 mg/dL — ABNORMAL HIGH (ref 70–99)
Potassium: 3.9 meq/L (ref 3.5–5.1)
Sodium: 139 meq/L (ref 135–145)
Total Bilirubin: 0.3 mg/dL (ref 0.3–1.2)
Total Protein: 6.4 g/dL (ref 6.0–8.3)

## 2011-04-04 LAB — CBC
HCT: 35 % — ABNORMAL LOW (ref 36.0–46.0)
Hemoglobin: 11.4 g/dL — ABNORMAL LOW (ref 12.0–15.0)
MCH: 27.4 pg (ref 26.0–34.0)
MCHC: 32.6 g/dL (ref 30.0–36.0)
MCV: 84.1 fL (ref 78.0–100.0)
Platelets: 262 K/uL (ref 150–400)
RBC: 4.16 MIL/uL (ref 3.87–5.11)
RDW: 14.2 % (ref 11.5–15.5)
WBC: 12.5 K/uL — ABNORMAL HIGH (ref 4.0–10.5)

## 2011-04-04 LAB — DIFFERENTIAL
Eosinophils Absolute: 0.4 10*3/uL (ref 0.0–0.7)
Lymphocytes Relative: 18 % (ref 12–46)
Lymphs Abs: 2.3 10*3/uL (ref 0.7–4.0)
Neutrophils Relative %: 74 % (ref 43–77)

## 2011-04-04 LAB — POCT I-STAT TROPONIN I: Troponin i, poc: 0 ng/mL (ref 0.00–0.08)

## 2011-04-04 LAB — D-DIMER, QUANTITATIVE: D-Dimer, Quant: 0.7 ug{FEU}/mL — ABNORMAL HIGH (ref 0.00–0.48)

## 2011-04-04 MED ORDER — IPRATROPIUM BROMIDE 0.02 % IN SOLN
0.5000 mg | Freq: Once | RESPIRATORY_TRACT | Status: AC
  Administered 2011-04-04: 0.5 mg via RESPIRATORY_TRACT
  Filled 2011-04-04: qty 2.5

## 2011-04-04 MED ORDER — ONDANSETRON HCL 4 MG/2ML IJ SOLN
4.0000 mg | Freq: Once | INTRAMUSCULAR | Status: AC
  Administered 2011-04-04: 4 mg via INTRAVENOUS
  Filled 2011-04-04: qty 2

## 2011-04-04 MED ORDER — ALBUTEROL SULFATE (5 MG/ML) 0.5% IN NEBU
5.0000 mg | INHALATION_SOLUTION | Freq: Once | RESPIRATORY_TRACT | Status: AC
  Administered 2011-04-04: 5 mg via RESPIRATORY_TRACT
  Filled 2011-04-04 (×2): qty 0.5

## 2011-04-04 MED ORDER — ASPIRIN 81 MG PO CHEW
324.0000 mg | CHEWABLE_TABLET | Freq: Once | ORAL | Status: AC
  Administered 2011-04-04: 324 mg via ORAL

## 2011-04-04 MED ORDER — NITROGLYCERIN 0.4 MG SL SUBL: 0.4000 mg | SUBLINGUAL_TABLET | SUBLINGUAL | Status: DC | PRN

## 2011-04-04 MED ORDER — IOHEXOL 350 MG/ML SOLN
70.0000 mL | Freq: Once | INTRAVENOUS | Status: AC | PRN
  Administered 2011-04-04: 70 mL via INTRAVENOUS

## 2011-04-04 MED ORDER — SODIUM CHLORIDE 0.9 % IV SOLN
INTRAVENOUS | Status: DC
  Administered 2011-04-04: 11:00:00 via INTRAVENOUS

## 2011-04-04 MED ORDER — ASPIRIN 81 MG PO CHEW
CHEWABLE_TABLET | ORAL | Status: AC
  Filled 2011-04-04: qty 4

## 2011-04-04 MED ORDER — MORPHINE SULFATE 4 MG/ML IJ SOLN
4.0000 mg | Freq: Once | INTRAMUSCULAR | Status: AC
  Administered 2011-04-04: 4 mg via INTRAVENOUS
  Filled 2011-04-04: qty 1

## 2011-04-04 MED ORDER — ALBUTEROL SULFATE HFA 108 (90 BASE) MCG/ACT IN AERS
2.0000 | INHALATION_SPRAY | RESPIRATORY_TRACT | Status: AC
  Administered 2011-04-04: 2 via RESPIRATORY_TRACT
  Filled 2011-04-04: qty 6.7

## 2011-04-04 MED ORDER — HYDROCODONE-HOMATROPINE 5-1.5 MG/5ML PO SYRP: 5.0000 mL | ORAL_SOLUTION | Freq: Four times a day (QID) | ORAL | Status: DC | PRN

## 2011-04-04 NOTE — ED Notes (Signed)
[facility]ed [facility] 

## 2011-04-04 NOTE — ED Notes (Signed)
Ambulatory to bathroom with difficulty.  States breathing "alot better after the nebs". Placed back on monitors awaiting test.

## 2011-04-04 NOTE — ED Notes (Signed)
Spoke to [facility], received report

## 2011-04-04 NOTE — ED Provider Notes (Signed)
History     CSN: [id number]  Arrival date & time 04/04/11  1143   First MD Initiated Contact with Patient 04/04/11 1158      Chief Complaint  Patient presents with  . Chest Pain    (Consider location/radiation/quality/duration/timing/severity/associated sxs/prior treatment) HPI  Patient is sent from [facility] for evaluation of acute onset CP, wheezing, coughing and sore throat that began at 2:30am this morning. The patient is a 46 year old female with diabetes, hypertension, and a family history of early heart disease. She awoke this morning at 2:30 AM with a sensation like "an elephant was sitting on my chest." She denies any radiation of the discomfort. She did feel short of breath, was wheezing, and had a productive cough and "razor blades in throat.". She also notes sore throat, diaphoresis, and pleuritic pain. She denies any nausea or vomiting. She has no known history of cardiac disease. For the past of weeks she's had a sensation of a flutter that comes and goes. It feels like her heart skips a beat. She denies any rapid heartbeat and she denies any rapid or irregular heartbeat right now. Also for the past week and a half she's had pain under both breasts. She called her primary care physician he suggested and might be due to the Naprosyn that she was taking for her pain. She stopped the Naprosyn and this pain became better. Patient was given 324mg  of ASA PTA at [facility] and by time of arrival states CP has completely resolved but ongoing SOB and wheezing.    Past Medical History  Diagnosis Date  . Diabetes mellitus, type II   . Obesity   . Hypertension   . Hyperlipidemia   . 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 Sharl Ma    Past Surgical History  Procedure Date  . Breast biopsy 2003    s/p  . Tonsillectomy 1982  . Dilation and curettage of uterus 2004    s/p  . Thyroidectomy 02/2009      Dr Gerrit Friends    Family History  Problem Relation Age of Onset  . Dementia Father     deceased age 86 secondary to dementia  . Bipolar disorder Father   . Emphysema Father     History  Substance Use Topics  . Smoking status: Former Games developer  . Smokeless tobacco: Not on file   Comment: smoked on and off for 20 years  . Alcohol Use: No    OB History    Grav Para Term Preterm Abortions TAB SAB Ect Mult Living                  Review of Systems  All other systems reviewed and are negative.    Allergies  Penicillins and Ace inhibitors  Home Medications   Current Outpatient Rx  Name Route Sig Dispense Refill  . ASPIRIN 81 MG PO TABS Oral Take 81 mg by mouth daily.      Marland Kitchen CALCIUM PO Oral Take 1 tablet by mouth 2 (two) times daily.    Marland Kitchen VITAMIN D 1000 UNITS PO TABS Oral Take 1,000 Units by mouth daily.      Marland Kitchen GLUCOSE BLOOD VI STRP  Use to check blood sugar three times a day     . HYDROCODONE-ACETAMINOPHEN 5-500 MG PO TABS Oral Take 1 tablet by mouth 2 (two) times daily as needed. For pain    . LEVOTHYROXINE SODIUM  200 MCG PO TABS Oral Take 200 mcg by mouth daily.      Marland Kitchen METFORMIN HCL 1000 MG PO TABS Oral Take 1,000 mg by mouth 2 (two) times daily with a meal.    . OLMESARTAN MEDOXOMIL 20 MG PO TABS Oral Take 10 mg by mouth daily.    Letta Pate LANCETS MISC  Use to check blood sugar three time a day     . ROSUVASTATIN CALCIUM 10 MG PO TABS Oral Take 5 mg by mouth daily.    Marland Kitchen SITAGLIPTIN PHOSPHATE 100 MG PO TABS Oral Take 100 mg by mouth daily.      BP 130/74  Pulse 85  Temp(Src) 97.7 F (36.5 C) (Oral)  Resp 19  SpO2 100%  LMP 03/19/2011  Physical Exam  Nursing note and vitals reviewed. Constitutional: She is oriented to person, place, and time. She appears well-developed and well-nourished. No distress.  HENT:  Head: Normocephalic and atraumatic.  Eyes: Conjunctivae are normal.  Neck: Normal range of motion. Neck supple.  Cardiovascular: Normal rate, regular  rhythm, normal heart sounds and intact distal pulses.  Exam reveals no gallop and no friction rub.   No murmur heard. Pulmonary/Chest: Effort normal and breath sounds normal. No respiratory distress. She has no wheezes. She has no rales. She exhibits no tenderness.  Abdominal: Soft. Bowel sounds are normal. She exhibits no distension and no mass. There is no tenderness. There is no rebound and no guarding.  Musculoskeletal: Normal range of motion. She exhibits no edema and no tenderness.  Neurological: She is alert and oriented to person, place, and time.  Skin: Skin is warm and dry. No rash noted. She is not diaphoretic. No erythema.  Psychiatric: She has a normal mood and affect.    ED Course  Procedures (including critical care time)  Neb albuterol/atrovent  IV morphine and zofran  1:58 PM Patient states "my chest feels so much better after that breathing treatment. My breathing is so much better too."    Date: 04/04/2011  Rate: 92  Rhythm: normal sinus rhythm  QRS Axis: normal  Intervals: normal  ST/T Wave abnormalities: normal, early precordial transition  Conduction Disutrbances: none  Narrative Interpretation:   Old EKG Reviewed: non provocative EKG compared UCC EKG at 9:19 and to Feb 25, 2010 with no significant changes noted    Labs Reviewed  CBC - Abnormal; Notable for the following:    WBC 12.5 (*)    Hemoglobin 11.4 (*)    HCT 35.0 (*)    All other components within normal limits  DIFFERENTIAL - Abnormal; Notable for the following:    Neutro Abs 9.3 (*)    All other components within normal limits  COMPREHENSIVE METABOLIC PANEL - Abnormal; Notable for the following:    Glucose, Bld 131 (*)    Albumin 3.4 (*)    All other components within normal limits  D-DIMER, QUANTITATIVE - Abnormal; Notable for the following:    D-Dimer, Quant 0.70 (*)    All other components within normal limits  LIPASE, BLOOD  POCT I-STAT TROPONIN I   Dg Chest 2 View  04/04/2011   *RADIOLOGY REPORT*  Clinical Data: Chest pain, shortness of breath  CHEST - 2 VIEW  Comparison: 05/03/2009  Findings: Normal heart size and vascularity.  Low lung volumes. Negative for pneumonia, collapse, consolidation, edema, effusion or pneumothorax.  Previous thyroidectomy noted.  Trachea midline.  IMPRESSION: Low volume exam.  No acute finding  Original Report Authenticated By: Judie Petit. TREVOR  Miles Costain, M.D.     No diagnosis found.  Patient describes atypical CP with symptoms of pressure and tightness and SOB with complete resolution after neb treatment. Question broncho spasm with bronchitis causing chest discomfort with wheezing and SOB. No acute findings on EKG and negative troponin. Low likelihood of ACS.   MDM  Sign out given to Dr. Lynelle Doctor who has been attending physician and L. Paz PA-C for pending CT angio chest. VSS. No complaints of CP throughout ER stay with complete resolution of SOB and tightness/wheezing.         Jenness Corner, [place] 04/04/11 1617

## 2011-04-04 NOTE — ED Provider Notes (Signed)
Patient relates 2 AM she started having scratchy throat with hoarseness in her voice. She states she was wheezing and short of breath and had a crushing chest pain but she's never had before she denies any prior history of asthma or asthma in her family. She does not smoke.   On exam now patient has flushed cheeks her lungs are clear without wheezes rhonchi or she still has some diminished breath sounds diffusely however.  Patient relates she feels much improved after 1 nebulizer, she did not want to receive another nebulizer. Patient is waiting on CT angiography done.  Medical screening examination/treatment/procedure(s) were conducted as a shared visit with non-physician practitioner(s) and myself.  I personally evaluated the patient during the encounter Devoria Albe, MD, Franz Dell, MD 04/05/11 [phone]

## 2011-04-04 NOTE — ED Provider Notes (Signed)
4:09 PM  Pt care resumed from [facility], [place]. Pt presented to ED with cc of chest pressure, SOB, Wheezing & sore throat that began at 230AM. Pt given 325 ASA at urgent care and sent to the ED for further wk up. Pt was treated here w neb and morphine. CXR negative for cardiopulmonary findings. EKG with no acute changes. Pt was found to have an Elevated d-dimer and CTAngio chest pending. Likely dx if CT neg is Bronchospasm. Per Bethany's plan if CTA is negative she will be Dc w inhaler & PCP f-u. Pt has been re-evaluated and is still hemodynamically stable, NAD, and chest pain free. She reports that she still has a sore throat and cough. On exam: Uvula midline (no tonsils), oropharynx clear and moist, NO difficulty w flexion or extension of neck, mils cervical lymphadenopathy,  heart w/ RRR, lungs CTAB, Chest & abd non-tender, no peripheral edema or calf tenderness.  BP 113/52  Pulse 95  Temp(Src) 97.7 F (36.5 C) (Oral)  Resp 18  SpO2 99%  LMP 03/18/2011    CT ANGIOGRAPHY CHEST 1. No CT findings for pulmonary embolism. 2. Normal thoracic aorta. 3. No acute pulmonary findings. 4. Coronary artery calcifications.  Original Report Authenticated By: P. Loralie Champagne, M.D.    5:05 PM Patient is to be discharged with recommendation to follow up with PCP in regards to today's hospital visit. Chest pain is not likely of cardiac etiology d/t presentation, neg CT angio, VSS, no tracheal deviation, no JVD or new murmur, RRR, breath sounds equal bilaterally, EKG without acute abnormalities, negative troponin, and negative CXR. Pt has been advised to use inhaler for chest tightness & return to the ED is CP becomes exertional, associated with diaphoresis or nausea, radiates to left jaw/arm, worsens or becomes concerning in any way. Pt appears reliable for follow up and is agreeable to discharge.  Patient will also be given Hycodan for pain relief of sore throat and cough suppressant.  Patient has been  also advised to return to the emergency department if she develops trismus, a fever, any nuchal rigidity or difficulty swallowing.  Case has been discussed with and seen by Dr. Devoria Albe who agrees with the above plan to discharge.    Jaci Carrel, [place] 04/04/11 1708

## 2011-04-04 NOTE — ED Provider Notes (Signed)
See prior note   Ward Givens, MD 04/04/11 2005

## 2011-04-04 NOTE — ED Provider Notes (Signed)
See prior note   Ward Givens, MD 04/04/11 2009

## 2011-04-04 NOTE — ED Notes (Signed)
Patient stated crushing chest pain middle of night. Went to work then urgent care who gave 324 mg aspirin. Patient transferred to ED for further evaluation [facility] stated patient has no complaints at this time chest pain 0/10. Ax4.

## 2011-04-04 NOTE — ED Notes (Signed)
Onset 1 1/2 weeks ago has had epigastric pain, heart flutter, related to naproxen.  stopped taking naproxen.  Symptoms improved.  Patient was awakened with sob, wheezing, pain in throat (razor blades) also felt "crushing " pain in chest-8/10. Pain has now improved to a 2/10.

## 2011-04-04 NOTE — ED Notes (Signed)
Patient reports "crushing" feeling as gone.  Patient states she would rather not take nitroglycerin unless absolutely necessary.  Spoke to dr Lorenz Coaster, hold ntg for now.

## 2011-04-04 NOTE — ED Provider Notes (Signed)
No chief complaint on file.   History of Present Illness:   The patient is a 46 year old female with diabetes, hypertension, and a family history of early heart disease. She awoke this morning at 2:30 AM with a sensation like "an elephant was sitting on my chest." She denies any radiation of the discomfort. She did feel short of breath, was wheezing, and had a productive cough. She also notes sore throat, diaphoresis, and pleuritic pain. She denies any nausea or vomiting. She has no known history of cardiac disease. For the past of weeks she's had a sensation of a flutter that comes and goes. It feels like her heart skips a beat. She denies any rapid heartbeat and she denies any rapid or irregular heartbeat right now. Also for the past week and a half she's had pain under both breasts. She called her primary care physician he suggested and might be due to the Naprosyn that she was taking for her pain. She stopped the Naprosyn and this pain became better.  Review of Systems:  Other than noted above, the patient denies any of the following symptoms. Systemic:  No fever, chills, sweats, or fatigue. ENT:  No nasal congestion, rhinorrhea, or sore throat. Pulmonary:  No cough, wheezing, shortness of breath, sputum production, hemoptysis. Cardiac:  No palpitations, rapid heartbeat, dizziness, presyncope or syncope. GI:  No abdominal pain, heartburn, nausea, or vomiting. Skin:  No rash or itching. Ext:  No leg pain or swelling.   PMFSH:  Past medical history, family history, social history, meds, and allergies were reviewed and updated as needed.  Physical Exam:   Vital signs:  BP 148/88  Pulse 94  Temp(Src) 98.6 F (37 C) (Oral)  Resp 24  SpO2 98% Gen:  Alert, oriented, in no distress, skin warm and dry. Eye:  PERRL, lids and conjunctivas normal.  Sclera non-icteric. ENT:  Mucous membranes moist, pharynx clear. Neck:  Supple, no adenopathy or tenderness.  No JVD. Lungs:  Clear to auscultation,  no wheezes, rales or rhonchi.  No respiratory distress. Heart:  Regular rhythm.  No gallops, murmers, clicks or rubs. Chest:  No chest wall tenderness. Abdomen:  Soft, nontender, no organomegaly or mass.  Bowel sounds normal.  No pulsatile abdominal mass or bruit. Ext:  No edema.  No calf tenderness and Homann's sign negative.  Pulses full and equal. Skin:  Warm and dry.  No rash.  Labs:   Results for orders placed during the hospital encounter of 04/04/11  GLUCOSE, CAPILLARY      Component Value Range   Glucose-Capillary 182 (*) 70 - 99 (mg/dL)    EKG:   Date: 40/98/1191  Rate: 91  Rhythm: normal sinus rhythm  QRS Axis: normal  Intervals: normal  ST/T Wave abnormalities: normal  Conduction Disutrbances:none  Narrative Interpretation: Normal sinus rhythm, normal EKG. No change from previous EKG.  Old EKG Reviewed: No change from previous EKG.   Medications given in UCC:  An IV line was started with normal saline at 50 cc an hour. She was given nitroglycerin 0.4 mg sublingually, and aspirin 325 mg chewed by mouth. She'll be transported to the emergency department by [facility].  Assessment:   Diagnoses that have been ruled out:  None  Diagnoses that are still under consideration:  None  Final diagnoses:  Chest pain    Plan:   1.  The patient was transported to the emergency department by [facility].  Reuben Likes, MD 04/04/11 1043

## 2011-04-04 NOTE — Discharge Instructions (Signed)
We have determined that your problem requires further evaluation in the emergency department.  We will take care of your transport there.  Once at the emergency department, you will be evaluated by a provider and they will order whatever treatment or tests they deem necessary.  We cannot guarantee that they will do any specific test or do any specific treatment.  ° °

## 2011-04-04 NOTE — Discharge Instructions (Signed)
Read instructions below for reasons to return to the Emergency Department. It is recommended that your follow up with your Primary Care Doctor in regards to today's visit. If you do not have a doctor, use the resource guide listed below to help you find one.   Read instructions below to learn more about your diagnosis and for reasons to return to the ED. Followup with your doctor if symptoms are not improving after 3-4 days.  If you do not have a doctor use the resource guide listed below to help he find one. You may return to the emergency department if symptoms worsen, become progressive, or become more concerning.   Viral Pharyngitis  Viral pharyngitis is a viral infection that produces redness, pain, and swelling (inflammation) of the throat. Viral infections are not treated with antibiotics.  HOME CARE INSTRUCTIONS  Drink enough fluids to keep your urine clear or pale yellow.  Eat soft, cold foods such as ice cream, frozen ice pops, or gelatin dessert.  Gargle with warm salt water (1 tsp salt per 1 qt of water).  Throat lozenges  Use over the counter medications for symptomatic relief  (Tylenol for fever/pain, Motrin/Ibuprofen for swelling/pain)  To help prevent spreading viral pharyngitis to others, avoid:  Mouth-to-mouth contact with others.  Sharing utensils for eating and drinking.  Coughing around others.   SEEK IMMEDIATE MEDICAL CARE IF:  A rash develops.  Bloody substance is coughed up.  You are unable to swallow liquids or food for 24 hours.  You develop any new symptoms such as uncontrolable vomiting, severe headache, stiff neck, chest pain, or trouble breathing or swallowing.  You have severe throat pain along with drooling or voice changes.   You are unable to fully open the mouth.    Chest Pain (Nonspecific)  HOME CARE INSTRUCTIONS  For the next few days, avoid physical activities that bring on chest pain. Continue physical activities as directed.  Do not smoke  cigarettes or drink alcohol until your symptoms are gone.  Only take over-the-counter or prescription medicine for pain, discomfort, or fever as directed by your caregiver.  Follow your caregiver's suggestions for further testing if your chest pain does not go away.  Keep any follow-up appointments you made. If you do not go to an appointment, you could develop lasting (chronic) problems with pain. If there is any problem keeping an appointment, you must call to reschedule.  SEEK MEDICAL CARE IF:  You think you are having problems from the medicine you are taking. Read your medicine instructions carefully.  Your chest pain does not go away, even after treatment.  You develop a rash with blisters on your chest.  SEEK IMMEDIATE MEDICAL CARE IF:  You have increased chest pain or pain that spreads to your arm, neck, jaw, back, or belly (abdomen).  You develop shortness of breath, an increasing cough, or you are coughing up blood.  You have severe back or abdominal pain, feel sick to your stomach (nauseous) or throw up (vomit).  You develop severe weakness, fainting, or chills.  You have an oral temperature above 102 F (38.9 C), not controlled by medicine.   THIS IS AN EMERGENCY. Do not wait to see if the pain will go away. Get medical help at once. Call your local emergency services (911 in [place] Do not drive yourself to the hospital.

## 2011-04-07 ENCOUNTER — Ambulatory Visit (INDEPENDENT_AMBULATORY_CARE_PROVIDER_SITE_OTHER): Payer: 59 | Admitting: Internal Medicine

## 2011-04-07 ENCOUNTER — Encounter: Payer: Self-pay | Admitting: Internal Medicine

## 2011-04-07 DIAGNOSIS — M25511 Pain in right shoulder: Secondary | ICD-10-CM

## 2011-04-07 DIAGNOSIS — M25519 Pain in unspecified shoulder: Secondary | ICD-10-CM

## 2011-04-07 DIAGNOSIS — R079 Chest pain, unspecified: Secondary | ICD-10-CM | POA: Insufficient documentation

## 2011-04-07 DIAGNOSIS — J069 Acute upper respiratory infection, unspecified: Secondary | ICD-10-CM | POA: Insufficient documentation

## 2011-04-07 MED ORDER — ESOMEPRAZOLE MAGNESIUM 40 MG PO CPDR: 40.0000 mg | DELAYED_RELEASE_CAPSULE | Freq: Every day | ORAL | Status: DC

## 2011-04-07 MED ORDER — HYDROCODONE-HOMATROPINE 5-1.5 MG/5ML PO SYRP: 5.0000 mL | ORAL_SOLUTION | Freq: Three times a day (TID) | ORAL | Status: AC | PRN

## 2011-04-07 NOTE — Assessment & Plan Note (Signed)
Patient has symptoms of viral URI. Refill for Hycodan provided. Otherwise symptomatic treatment.

## 2011-04-07 NOTE — Assessment & Plan Note (Signed)
Patient with chronic right shoulder pain. X-ray showed calcific bursitis and also component of calcified tendinopathy. She was seen by ortho. Despite cortisone injection, patient having persistent discomfort.  Patient advised to hold off on shoulder exercises for now. Use cool compresses twice daily as needed.  01/26/2011 IMPRESSION:  Probable calcific bursitis. There may also be a component of calcified tendinopathy.

## 2011-04-07 NOTE — Progress Notes (Signed)
Subjective:    Patient ID: Stacey Roberts, female    DOB: Jun 12, 1965, 46 y.o.   MRN: [id number]  HPI  46 year old white female with history of type 2 diabetes, hypothyroidism, hypertension and hyperlipidemia for ER followup. Patient was seen on Tuesday after waking up early in the morning with chest tightness, shortness of breath and severe sore throat. Patient was initially seen at urgent care and then transferred to the ER for further evaluation. Her chest x-ray was normal. Cardiac enzymes were normal. CT of chest PE protocol was ordered due to elevated d-dimer. CT of chest was negative for pulmonary embolism.  Over last 1-2 months patient has been struggling with right shoulder pain presumed secondary to bursitis/tendinitis. She was given a cortisone shot several weeks ago. However she has also been taking multiple doses of naproxen for shoulder pain.  Since her ER evaluation, patient reports stopping naproxen use.  Her chest pain has resolved.  She notes upper respiratory congestion.  ER records reviewed.   Review of Systems Negative for fever but feels hot, negative for exertional chest pain  Past Medical History  Diagnosis Date  . Diabetes mellitus, type II   . Obesity   . Hypertension   . Hyperlipidemia   . 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 Sharl Ma    History   Social History  . Marital Status: Divorced    Spouse Name: N/A    Number of Children: N/A  . Years of Education: N/A   Occupational History  . Not on file.   Social History Main Topics  . Smoking status: Former Games developer  . Smokeless tobacco: Not on file   Comment: smoked on and off for 20 years  . Alcohol Use: No  . Drug Use: No  . Sexually Active: Not on file   Other Topics Concern  . Not on file   Social History Narrative   The patient is divorced, moved to [place] from [place].   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  Tobacco use:  She quit 1 year ago.  She smoked onand off for approximately 20 years.  No history of recreational druguse.    Past Surgical History  Procedure Date  . Breast biopsy 2003    s/p  . Tonsillectomy 1982  . Dilation and curettage of uterus 2004    s/p  . Thyroidectomy 02/2009    Dr Gerrit Friends    Family History  Problem Relation Age of Onset  . Dementia Father     deceased age 19 secondary to dementia  . Bipolar disorder Father   . Emphysema Father     Allergies  Allergen Reactions  . Penicillins Shortness Of Breath and Rash  . Ace Inhibitors     REACTION: cough    Current Outpatient Prescriptions on File Prior to Visit  Medication Sig Dispense Refill  . aspirin 81 MG tablet Take 81 mg by mouth daily.        Marland Kitchen CALCIUM PO Take 1 tablet by mouth 2 (two) times daily.      . cholecalciferol (VITAMIN D) 1000 UNITS tablet Take 1,000 Units by mouth daily.        Marland Kitchen glucose blood (ONE TOUCH TEST STRIPS) test strip Use to check blood sugar three times a day       .  HYDROcodone-acetaminophen (VICODIN) 5-500 MG per tablet Take 1 tablet by mouth 2 (two) times daily as needed. For pain      . levothyroxine (SYNTHROID, LEVOTHROID) 200 MCG tablet Take 200 mcg by mouth daily.        . metFORMIN (GLUCOPHAGE) 1000 MG tablet Take 1,000 mg by mouth 2 (two) times daily with a meal.      . olmesartan (BENICAR) 20 MG tablet Take 10 mg by mouth daily.      . ONE TOUCH LANCETS MISC Use to check blood sugar three time a day       . rosuvastatin (CRESTOR) 10 MG tablet Take 5 mg by mouth daily.      . sitaGLIPtin (JANUVIA) 100 MG tablet Take 100 mg by mouth daily.        BP 142/100  Pulse 88  Temp(Src) 98.4 F (36.9 C) (Oral)  Ht 5\' 4"  (1.626 m)  Wt 261 lb (118.389 kg)  BMI 44.80 kg/m2  LMP 03/18/2011       Objective:   Physical Exam    Constitutional: She is oriented to person, place, and time. She appears well-developed and well-nourished.  HENT:  Right Ear: External ear normal.  Left Ear: External ear normal.  Mouth/Throat: No oropharyngeal exudate.       Oropharyngeal erythema  Cardiovascular: Normal rate and regular rhythm.   No murmur heard. Pulmonary/Chest: Effort normal and breath sounds normal. She has no wheezes. She has no rales.  Lymphadenopathy:    She has no cervical adenopathy.  Neurological: She is alert and oriented to person, place, and time.  Skin: Skin is warm.  Psychiatric: She has a normal mood and affect. Her behavior is normal.       Assessment & Plan:

## 2011-04-07 NOTE — Assessment & Plan Note (Signed)
46 year old white female with nocturnal chest pain after recent significant NSAID use.  I suspect NSAID gastritis. Start Nexium 40 mg once daily. Use for at least 6-8 weeks.  Patient advised to call office if symptoms persist or worsen.

## 2011-04-12 ENCOUNTER — Telehealth: Payer: Self-pay | Admitting: Internal Medicine

## 2011-04-12 MED ORDER — SITAGLIPTIN PHOSPHATE 100 MG PO TABS: 100.0000 mg | ORAL_TABLET | Freq: Every day | ORAL | Status: DC

## 2011-04-12 NOTE — Telephone Encounter (Signed)
rx sent in electronically 

## 2011-04-12 NOTE — Telephone Encounter (Signed)
Refill- januvia 100mg  tablet. Take one tablet by mouth once daily. Qty 90 last fill 12.17.12

## 2011-05-10 ENCOUNTER — Other Ambulatory Visit: Payer: Self-pay | Admitting: *Deleted

## 2011-05-10 MED ORDER — METFORMIN HCL 1000 MG PO TABS: 1000.0000 mg | ORAL_TABLET | Freq: Two times a day (BID) | ORAL | Status: DC

## 2011-05-10 MED ORDER — GLUCOSE BLOOD VI STRP: ORAL_STRIP | Status: DC

## 2011-05-19 ENCOUNTER — Ambulatory Visit: Payer: 59 | Admitting: Internal Medicine

## 2011-06-14 ENCOUNTER — Other Ambulatory Visit: Payer: Self-pay | Admitting: Internal Medicine

## 2011-06-14 DIAGNOSIS — C73 Malignant neoplasm of thyroid gland: Secondary | ICD-10-CM

## 2011-06-28 ENCOUNTER — Encounter (INDEPENDENT_AMBULATORY_CARE_PROVIDER_SITE_OTHER): Payer: Self-pay

## 2011-07-03 ENCOUNTER — Ambulatory Visit (HOSPITAL_COMMUNITY)
Admission: RE | Admit: 2011-07-03 | Discharge: 2011-07-03 | Disposition: A | Discharge status: Home / Self Care | Payer: 59 | Source: Ambulatory Visit | Attending: Internal Medicine | Admitting: Internal Medicine

## 2011-07-03 DIAGNOSIS — R599 Enlarged lymph nodes, unspecified: Secondary | ICD-10-CM | POA: Insufficient documentation

## 2011-07-03 DIAGNOSIS — C73 Malignant neoplasm of thyroid gland: Secondary | ICD-10-CM | POA: Insufficient documentation

## 2011-09-11 ENCOUNTER — Other Ambulatory Visit: Payer: Self-pay | Admitting: Internal Medicine

## 2011-10-25 ENCOUNTER — Telehealth: Payer: Self-pay | Admitting: Internal Medicine

## 2011-10-25 NOTE — Telephone Encounter (Signed)
Schedule any labs?

## 2011-10-25 NOTE — Telephone Encounter (Signed)
Patient per mychart appt has scheduled an appt for 11/22/11 and would like to know if he is to have labs prior. Please advise.

## 2011-10-26 NOTE — Telephone Encounter (Signed)
Can you call pt and schedule these labs 1 week prior to appointment?  Thanks--b-met 401.9/aic microalbumin/cr ratio 250.00/flp.lft's 272.4/tsh 244.9

## 2011-10-26 NOTE — Telephone Encounter (Signed)
BMET 401.9 A1c, microalbumin / cr ratio - 250.00 FLP, LFTs - 272.4 TSH 244.9

## 2011-11-15 ENCOUNTER — Other Ambulatory Visit (INDEPENDENT_AMBULATORY_CARE_PROVIDER_SITE_OTHER): Payer: 59

## 2011-11-15 DIAGNOSIS — E039 Hypothyroidism, unspecified: Secondary | ICD-10-CM

## 2011-11-15 DIAGNOSIS — E119 Type 2 diabetes mellitus without complications: Secondary | ICD-10-CM

## 2011-11-15 DIAGNOSIS — E785 Hyperlipidemia, unspecified: Secondary | ICD-10-CM

## 2011-11-15 DIAGNOSIS — I10 Essential (primary) hypertension: Secondary | ICD-10-CM

## 2011-11-15 LAB — HEPATIC FUNCTION PANEL
Alkaline Phosphatase: 78 U/L (ref 39–117)
Bilirubin, Direct: 0 mg/dL (ref 0.0–0.3)
Total Bilirubin: 0.3 mg/dL (ref 0.3–1.2)

## 2011-11-15 LAB — LIPID PANEL
HDL: 43.9 mg/dL (ref >39.00)
LDL Cholesterol: 70 mg/dL (ref 0–99)
Total CHOL/HDL Ratio: 3
Triglycerides: 146 mg/dL (ref 0.0–149.0)
VLDL: 29.2 mg/dL (ref 0.0–40.0)

## 2011-11-15 LAB — BASIC METABOLIC PANEL
CO2: 27 mEq/L (ref 19–32)
Chloride: 100 mEq/L (ref 96–112)
Glucose, Bld: 219 mg/dL — ABNORMAL HIGH (ref 70–99)
Sodium: 135 mEq/L (ref 135–145)

## 2011-11-15 LAB — MICROALBUMIN / CREATININE URINE RATIO
Creatinine,U: 96.9 mg/dL
Microalb, Ur: 0.4 mg/dL (ref 0.0–1.9)

## 2011-11-21 ENCOUNTER — Ambulatory Visit (INDEPENDENT_AMBULATORY_CARE_PROVIDER_SITE_OTHER): Payer: 59 | Admitting: Family Medicine

## 2011-11-21 DIAGNOSIS — E119 Type 2 diabetes mellitus without complications: Secondary | ICD-10-CM

## 2011-11-21 NOTE — Progress Notes (Signed)
Patient presents today for 3 month DM follow-up as part of the employer-sponsored Link to Wellness program.  Medications, glucose readings, and lifestyle have been reviewed.  Details of this visit may be found in [employer] documenting software through [employer] [facility]).  Patient has set a series of goals and will follow-up in 6 weeks due to above goal A1c.

## 2011-11-22 ENCOUNTER — Ambulatory Visit: Payer: Self-pay | Admitting: Internal Medicine

## 2011-11-24 ENCOUNTER — Encounter: Payer: Self-pay | Admitting: Internal Medicine

## 2011-11-24 ENCOUNTER — Ambulatory Visit (INDEPENDENT_AMBULATORY_CARE_PROVIDER_SITE_OTHER): Payer: 59 | Admitting: Internal Medicine

## 2011-11-24 VITALS — BP 160/100 | HR 96 | Temp 97.6°F | Wt 263.0 lb

## 2011-11-24 DIAGNOSIS — C73 Malignant neoplasm of thyroid gland: Secondary | ICD-10-CM

## 2011-11-24 DIAGNOSIS — I10 Essential (primary) hypertension: Secondary | ICD-10-CM

## 2011-11-24 DIAGNOSIS — E119 Type 2 diabetes mellitus without complications: Secondary | ICD-10-CM

## 2011-11-24 DIAGNOSIS — R635 Abnormal weight gain: Secondary | ICD-10-CM

## 2011-11-24 MED ORDER — BUPROPION HCL ER (XL) 150 MG PO TB24: 150.0000 mg | ORAL_TABLET | Freq: Every day | ORAL | Status: DC

## 2011-11-24 MED ORDER — SAXAGLIPTIN HCL 5 MG PO TABS: 5.0000 mg | ORAL_TABLET | Freq: Every day | ORAL | Status: DC

## 2011-11-24 MED ORDER — OLMESARTAN MEDOXOMIL 20 MG PO TABS: 20.0000 mg | ORAL_TABLET | Freq: Every day | ORAL | Status: DC

## 2011-11-24 NOTE — Assessment & Plan Note (Signed)
She may have eating disorder.  She struggles with lack of motivation and apathy.  Trial of wellbutrin xl 150 mg once daily

## 2011-11-24 NOTE — Assessment & Plan Note (Signed)
Followed by her endocrinologist-Dr. Sharl Ma. Surveillance has been negative.  Patient reports they are following a lymph node right side of her neck.

## 2011-11-24 NOTE — Progress Notes (Signed)
Subjective:    Patient ID: Stacey Roberts, female    DOB: 25-Jul-1965, 46 y.o.   MRN: [id number]  HPI  46 year old white female with history of type 2 diabetes, hypertension and thyroid papillary adenocarcinoma for follow up. She is followed by her endocrinologist. Her surveillance for thyroid cancer has been normal.   Type 2 diabetes - patient's dietary compliance is very poor. Her blood sugars have been elevated. Her A1c previously in the 6 range.  A1c is now 9.0.  She has difficulty controlling her appetite for carbs.  She feels she has eating disorder.  She stopped Januvia for fear of worsening her thyroid cancer.   Review of Systems Weight gain.  No chest pain or SOB  Past Medical History  Diagnosis Date  . Diabetes mellitus, type II   . Obesity   . Hypertension   . Hyperlipidemia   . 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 Sharl Ma    History   Social History  . Marital Status: Divorced    Spouse Name: N/A    Number of Children: N/A  . Years of Education: N/A   Occupational History  . Not on file.   Social History Main Topics  . Smoking status: Former Games developer  . Smokeless tobacco: Not on file   Comment: smoked on and off for 20 years  . Alcohol Use: No  . Drug Use: No  . Sexually Active: Not on file   Other Topics Concern  . Not on file   Social History Narrative   The patient is divorced, moved to [place] from [place].  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  Tobacco use:  She quit 1 year ago.  She smoked onand off for approximately 20 years.  No history of recreational druguse.    Past Surgical History  Procedure Date  . Breast biopsy 2003    s/p  . Tonsillectomy 1982  . Dilation and curettage of uterus 2004    s/p  . Thyroidectomy 02/2009      Dr Gerrit Friends    Family History  Problem Relation Age of Onset  . Dementia Father     deceased age 74 secondary to dementia  . Bipolar disorder Father   . Emphysema Father     Allergies  Allergen Reactions  . Penicillins Shortness Of Breath and Rash  . Ace Inhibitors     REACTION: cough    Current Outpatient Prescriptions on File Prior to Visit  Medication Sig Dispense Refill  . aspirin 81 MG tablet Take 81 mg by mouth daily.        Marland Kitchen CALCIUM PO Take 1 tablet by mouth 2 (two) times daily.      . cholecalciferol (VITAMIN D) 1000 UNITS tablet Take 1,000 Units by mouth daily.        Marland Kitchen glucose blood (ONE TOUCH TEST STRIPS) test strip Use to check blood sugar three times a day  100 each  3  . levothyroxine (SYNTHROID, LEVOTHROID) 200 MCG tablet Take 200 mcg by mouth daily.        . metFORMIN (GLUCOPHAGE) 1000 MG tablet Take 1 tablet (1,000 mg total) by mouth 2 (two) times daily with a meal.  60 tablet  5  . NEXIUM 40 MG capsule TAKE 1 CAPSULE BY MOUTH DAILY.  90 capsule  11  . ONE TOUCH LANCETS MISC Use to check blood sugar three time a day       . rosuvastatin (CRESTOR) 10 MG tablet Take 5 mg by mouth daily.      Marland Kitchen DISCONTD: olmesartan (BENICAR) 20 MG tablet Take 10 mg by mouth daily.      Marland Kitchen buPROPion (WELLBUTRIN XL) 150 MG 24 hr tablet Take 1 tablet (150 mg total) by mouth daily.  30 tablet  2  . saxagliptin HCl (ONGLYZA) 5 MG TABS tablet Take 1 tablet (5 mg total) by mouth daily.  30 tablet  2  . DISCONTD: Calcium Carbonate 1500 MG TABS Take 1,500 mg by mouth daily.          BP 160/100  Pulse 96  Temp 97.6 F (36.4 C) (Oral)  Wt 263 lb (119.296 kg)       Objective:   Physical Exam  Constitutional: She is oriented to person, place, and time. She appears well-developed and well-nourished.  HENT:  Head: Normocephalic and atraumatic.  Cardiovascular: Normal rate, regular rhythm and normal heart sounds.   Pulmonary/Chest: Effort normal and breath sounds normal. She has no  wheezes.  Abdominal: Soft. Bowel sounds are normal.  Musculoskeletal: She exhibits no edema.  Neurological: She is alert and oriented to person, place, and time. No cranial nerve deficit.  Skin: Skin is warm and dry.  Psychiatric: She has a normal mood and affect. Her behavior is normal.          Assessment & Plan:

## 2011-11-24 NOTE — Assessment & Plan Note (Signed)
BP suboptimally controlled.  Increase benicar to 20 mg once daily.  I encouraged weight loss.  BP: 160/100 mmHg  Lab Results  Component Value Date   CREATININE 0.6 11/15/2011

## 2011-11-24 NOTE — Assessment & Plan Note (Signed)
Poorly controlled due to dietary noncompliance. Patient may have eating disorder. She is investigating referral to psychologist. Continue metformin 1000 mg twice daily. She stopped Januvia due to concerns that it may be worsening her thyroid cancer. Switch to Onglyza 5 mg once daily.

## 2011-11-25 ENCOUNTER — Other Ambulatory Visit: Payer: Self-pay | Admitting: Internal Medicine

## 2011-11-25 DIAGNOSIS — C73 Malignant neoplasm of thyroid gland: Secondary | ICD-10-CM

## 2011-11-27 ENCOUNTER — Other Ambulatory Visit: Payer: Self-pay | Admitting: Internal Medicine

## 2011-11-28 ENCOUNTER — Encounter: Payer: Self-pay | Admitting: Family Medicine

## 2011-11-28 NOTE — Progress Notes (Signed)
Patient ID: Stacey Roberts, female   DOB: 01-10-1966, 46 y.o.   MRN: [id number] Reviewed and agree with documentation and management.

## 2011-12-18 ENCOUNTER — Other Ambulatory Visit: Payer: Self-pay | Admitting: Internal Medicine

## 2011-12-18 DIAGNOSIS — Z1231 Encounter for screening mammogram for malignant neoplasm of breast: Secondary | ICD-10-CM

## 2011-12-26 ENCOUNTER — Ambulatory Visit: Payer: 59 | Admitting: Internal Medicine

## 2011-12-28 ENCOUNTER — Emergency Department (HOSPITAL_COMMUNITY)
Admission: EM | Admit: 2011-12-28 | Discharge: 2011-12-28 | Disposition: A | Discharge status: Home / Self Care | Payer: 59 | Source: Home / Self Care | Attending: Emergency Medicine | Admitting: Emergency Medicine

## 2011-12-28 ENCOUNTER — Encounter (HOSPITAL_COMMUNITY): Payer: Self-pay | Admitting: Emergency Medicine

## 2011-12-28 DIAGNOSIS — J40 Bronchitis, not specified as acute or chronic: Secondary | ICD-10-CM

## 2011-12-28 MED ORDER — AZITHROMYCIN 250 MG PO TABS: 250.0000 mg | ORAL_TABLET | Freq: Every day | ORAL | Status: DC

## 2011-12-28 MED ORDER — ALBUTEROL SULFATE HFA 108 (90 BASE) MCG/ACT IN AERS: 1.0000 | INHALATION_SPRAY | Freq: Four times a day (QID) | RESPIRATORY_TRACT | Status: DC | PRN

## 2011-12-28 MED ORDER — GUAIFENESIN-CODEINE 100-10 MG/5ML PO SYRP: 5.0000 mL | ORAL_SOLUTION | Freq: Three times a day (TID) | ORAL | Status: DC | PRN

## 2011-12-28 NOTE — ED Provider Notes (Signed)
History     CSN: [id number]  Arrival date & time 12/28/11  4098   First MD Initiated Contact with Patient 12/28/11 [phone]      Chief Complaint  Patient presents with  . Cough    productive cough x 5 days    (Consider location/radiation/quality/duration/timing/severity/associated sxs/prior treatment) HPI Comments: Patient presents urgent care after having been sent by her supervisor to be checked at urgent care and she's been having respiratory symptoms for 5 days which she describes as cough, nasal congestion body aches feeling dizzy at times and now expectorating yellow-looking sputum. She denies any fevers, chest pain shortness of breath but doesn't note some mild wheezing. She usually gets " bronchitis", every year and likes to take a Z-Pack" which seems to help with her symptoms in the past.  Patient is a 46 y.o. female presenting with cough. The history is provided by the patient.  Cough This is a new problem. The current episode started more than 2 days ago. The problem occurs every few minutes. The problem has not changed since onset.The cough is productive of sputum. Associated symptoms include chills, ear congestion, rhinorrhea, sore throat and wheezing. Pertinent negatives include no chest pain, no ear pain and no shortness of breath. She has tried decongestants for the symptoms. The treatment provided no relief. She is not a smoker. Her past medical history is significant for bronchitis and asthma. Her past medical history does not include COPD.    Past Medical History  Diagnosis Date  . Diabetes mellitus, type II   . Obesity   . Hypertension   . Hyperlipidemia   . 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 Sharl Ma    Past Surgical History  Procedure Date  . Breast biopsy 2003    s/p  . Tonsillectomy 1982  . Dilation and curettage of uterus 2004    s/p  . Thyroidectomy  02/2009    Dr Gerrit Friends    Family History  Problem Relation Age of Onset  . Dementia Father     deceased age 9 secondary to dementia  . Bipolar disorder Father   . Emphysema Father     History  Substance Use Topics  . Smoking status: Former Games developer  . Smokeless tobacco: Not on file     Comment: smoked on and off for 20 years  . Alcohol Use: No    OB History    Grav Para Term Preterm Abortions TAB SAB Ect Mult Living                  Review of Systems  Constitutional: Positive for chills. Negative for fever, diaphoresis, activity change and fatigue.  HENT: Positive for sore throat and rhinorrhea. Negative for ear pain, neck pain and tinnitus.   Respiratory: Positive for cough and wheezing. Negative for shortness of breath.   Cardiovascular: Negative for chest pain, palpitations and leg swelling.  Neurological: Positive for dizziness.    Allergies  Penicillins and Ace inhibitors  Home Medications   Current Outpatient Rx  Name  Route  Sig  Dispense  Refill  . ALBUTEROL SULFATE HFA 108 (90 BASE) MCG/ACT IN AERS   Inhalation   Inhale 1-2 puffs into the lungs every 6 (six) hours as needed for wheezing or shortness of breath.   1 Inhaler   0   . ASPIRIN 81 MG PO TABS   Oral   Take 81 mg  by mouth daily.           . AZITHROMYCIN 250 MG PO TABS   Oral   Take 1 tablet (250 mg total) by mouth daily. Take first 2 tablets together, then 1 every day until finished.   6 tablet   0   . BUPROPION HCL ER (XL) 150 MG PO TB24   Oral   Take 1 tablet (150 mg total) by mouth daily.   30 tablet   2   . CALCIUM PO   Oral   Take 1 tablet by mouth 2 (two) times daily.         Marland Kitchen VITAMIN D 1000 UNITS PO TABS   Oral   Take 1,000 Units by mouth daily.           Marland Kitchen GLUCOSE BLOOD VI STRP      Use to check blood sugar three times a day   100 each   3   . GUAIFENESIN-CODEINE 100-10 MG/5ML PO SYRP   Oral   Take 5 mLs by mouth 3 (three) times daily as needed for cough or  congestion.   120 mL   0   . LEVOTHYROXINE SODIUM 200 MCG PO TABS   Oral   Take 200 mcg by mouth daily.           Marland Kitchen METFORMIN HCL 1000 MG PO TABS      TAKE 1 TABLET BY MOUTH 2 TIMES DAILY WITH A MEAL.   60 tablet   5   . NEXIUM 40 MG PO CPDR      TAKE 1 CAPSULE BY MOUTH DAILY.   90 capsule   11   . OLMESARTAN MEDOXOMIL 20 MG PO TABS   Oral   Take 1 tablet (20 mg total) by mouth daily.   90 tablet   1   . ONETOUCH LANCETS MISC      Use to check blood sugar three time a day          . ROSUVASTATIN CALCIUM 10 MG PO TABS   Oral   Take 5 mg by mouth daily.         Marland Kitchen SAXAGLIPTIN HCL 5 MG PO TABS   Oral   Take 1 tablet (5 mg total) by mouth daily.   30 tablet   2     BP 139/84  Pulse 98  Temp 98.6 F (37 C) (Oral)  Resp 20  SpO2 96%  LMP 12/28/2011  Physical Exam  Nursing note and vitals reviewed. Constitutional: Vital signs are normal. She appears well-developed and well-nourished.  Non-toxic appearance. She does not have a sickly appearance. She does not appear ill. No distress.  HENT:  Head: Normocephalic.  Right Ear: Tympanic membrane normal.  Left Ear: Tympanic membrane normal.  Nose: Rhinorrhea present. No sinus tenderness.  Mouth/Throat: Uvula is midline. Posterior oropharyngeal erythema present. No oropharyngeal exudate.  Eyes: Conjunctivae normal are normal. Pupils are equal, round, and reactive to light. Right eye exhibits no discharge.  Neck: Neck supple.  Cardiovascular: Normal rate.  Exam reveals no gallop and no friction rub.   No murmur heard. Pulmonary/Chest: No respiratory distress. She has no decreased breath sounds. She has no wheezes. She has no rhonchi. She has no rales. She exhibits no tenderness.  Skin: No rash noted. No erythema.    ED Course  Procedures (including critical care time)  Labs Reviewed - No data to display No results found.   1. Bronchitis  MDM  Mild upper respiratory symptoms consistent with a  upper respiratory process and mild bronchitis. Patient has been encouraged to use an antitussive and albuterol for the next 48 hours and have been provided a conditional antibiotic prescription. She agrees with treatment plan and followup care as necessary. Patient is breathing comfortably in no respiratory distress and looks comfortable. Afebrile.      Jimmie Molly, MD 12/28/11 1003

## 2011-12-28 NOTE — ED Notes (Signed)
Pt c/o productive cough with yellow sputum x 5 days. Pt denies fever, n/v/d.

## 2012-01-02 ENCOUNTER — Ambulatory Visit: Payer: 59

## 2012-01-02 ENCOUNTER — Ambulatory Visit (HOSPITAL_COMMUNITY): Payer: 59

## 2012-01-10 ENCOUNTER — Ambulatory Visit: Payer: 59

## 2012-01-15 ENCOUNTER — Ambulatory Visit (HOSPITAL_COMMUNITY): Payer: 59

## 2012-01-22 ENCOUNTER — Ambulatory Visit (HOSPITAL_COMMUNITY)
Admission: RE | Admit: 2012-01-22 | Discharge: 2012-01-22 | Disposition: A | Discharge status: Home / Self Care | Payer: 59 | Source: Ambulatory Visit | Attending: Internal Medicine | Admitting: Internal Medicine

## 2012-01-22 DIAGNOSIS — C73 Malignant neoplasm of thyroid gland: Secondary | ICD-10-CM | POA: Insufficient documentation

## 2012-01-24 DIAGNOSIS — N8500 Endometrial hyperplasia, unspecified: Secondary | ICD-10-CM

## 2012-01-24 DIAGNOSIS — C50919 Malignant neoplasm of unspecified site of unspecified female breast: Secondary | ICD-10-CM

## 2012-01-24 HISTORY — DX: Malignant neoplasm of unspecified site of unspecified female breast: C50.919

## 2012-01-24 HISTORY — DX: Endometrial hyperplasia, unspecified: N85.00

## 2012-01-29 ENCOUNTER — Ambulatory Visit
Admission: RE | Admit: 2012-01-29 | Discharge: 2012-01-29 | Disposition: A | Discharge status: Home / Self Care | Payer: 59 | Source: Ambulatory Visit | Attending: Internal Medicine | Admitting: Internal Medicine

## 2012-01-29 DIAGNOSIS — Z1231 Encounter for screening mammogram for malignant neoplasm of breast: Secondary | ICD-10-CM

## 2012-02-05 ENCOUNTER — Other Ambulatory Visit: Payer: Self-pay | Admitting: *Deleted

## 2012-02-05 DIAGNOSIS — E785 Hyperlipidemia, unspecified: Secondary | ICD-10-CM

## 2012-02-05 MED ORDER — ATORVASTATIN CALCIUM 20 MG PO TABS: 20.0000 mg | ORAL_TABLET | Freq: Every day | ORAL | Status: DC

## 2012-02-05 MED ORDER — PANTOPRAZOLE SODIUM 40 MG PO TBEC: 40.0000 mg | DELAYED_RELEASE_TABLET | Freq: Every day | ORAL | Status: DC

## 2012-02-14 ENCOUNTER — Encounter: Payer: Self-pay | Admitting: Internal Medicine

## 2012-02-15 ENCOUNTER — Other Ambulatory Visit: Payer: Self-pay | Admitting: Internal Medicine

## 2012-02-15 MED ORDER — DICLOFENAC SODIUM 50 MG PO TBEC: 50.0000 mg | DELAYED_RELEASE_TABLET | Freq: Every day | ORAL | Status: DC | PRN

## 2012-02-16 ENCOUNTER — Encounter: Payer: Self-pay | Admitting: Internal Medicine

## 2012-02-29 ENCOUNTER — Ambulatory Visit (INDEPENDENT_AMBULATORY_CARE_PROVIDER_SITE_OTHER): Payer: 59 | Admitting: Internal Medicine

## 2012-02-29 ENCOUNTER — Encounter: Payer: Self-pay | Admitting: Internal Medicine

## 2012-02-29 VITALS — BP 130/88 | HR 70 | Temp 98.0°F | Wt 256.0 lb

## 2012-02-29 DIAGNOSIS — R002 Palpitations: Secondary | ICD-10-CM | POA: Insufficient documentation

## 2012-02-29 DIAGNOSIS — IMO0001 Reserved for inherently not codable concepts without codable children: Secondary | ICD-10-CM

## 2012-02-29 NOTE — Assessment & Plan Note (Signed)
Patient pressure bilateral weight loss. I encouraged patient continue regular exercise program. Patient to followup with her endocrinologist regarding potentially modifying her thyroid replacement.

## 2012-02-29 NOTE — Progress Notes (Signed)
Subjective:    Patient ID: Stacey Roberts, female    DOB: Jul 09, 1965, 47 y.o.   MRN: [id number]  HPI  47 year old white female with history of type 2 diabetes uncontrolled, papillary thyroid cancer and hypertension complains of intermittent heart flutters x1 week. Her symptoms started on Monday night. She describes intermittent fluttering sensation in her chest. She denies any associated chest pain. She also denies dizziness. Symptoms last approximately 1 minute.  She drinks 2 cups of coffee per day. She has also been taking a weight loss supplement called raspberry ketones.  Patient has also started exercise program on January 1st. She is frustrated by by lack of weight loss.  Review of Systems Negative for chest pain or shortness of breath  Past Medical History  Diagnosis Date  . Diabetes mellitus, type II   . Obesity   . Hypertension   . Hyperlipidemia   . 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 Sharl Ma    History   Social History  . Marital Status: Divorced    Spouse Name: N/A    Number of Children: N/A  . Years of Education: N/A   Occupational History  . Not on file.   Social History Main Topics  . Smoking status: Former Games developer  . Smokeless tobacco: Not on file     Comment: smoked on and off for 20 years  . Alcohol Use: No  . Drug Use: No  . Sexually Active: Not on file   Other Topics Concern  . Not on file   Social History Narrative   The patient is divorced, moved to [place] from [place].  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  Tobacco use:  She quit 1 year ago.  She smoked onand off for approximately 20 years.  No history of recreational druguse.    Past Surgical History  Procedure Date  . Breast biopsy 2003    s/p  . Tonsillectomy 1982  .  Dilation and curettage of uterus 2004    s/p  . Thyroidectomy 02/2009    Dr Gerrit Friends    Family History  Problem Relation Age of Onset  . Dementia Father     deceased age 69 secondary to dementia  . Bipolar disorder Father   . Emphysema Father     Allergies  Allergen Reactions  . Penicillins Shortness Of Breath and Rash  . Ace Inhibitors     REACTION: cough    Current Outpatient Prescriptions on File Prior to Visit  Medication Sig Dispense Refill  . albuterol (PROVENTIL HFA;VENTOLIN HFA) 108 (90 BASE) MCG/ACT inhaler Inhale 1-2 puffs into the lungs every 6 (six) hours as needed for wheezing or shortness of breath.  1 Inhaler  0  . aspirin 81 MG tablet Take 81 mg by mouth daily.        Marland Kitchen atorvastatin (LIPITOR) 20 MG tablet Take 1 tablet (20 mg total) by mouth daily.  90 tablet  3  . azithromycin (ZITHROMAX) 250 MG tablet Take 1 tablet (250 mg total) by mouth daily. Take first 2 tablets together, then 1 every day until finished.  6 tablet  0  . diclofenac (VOLTAREN) 50 MG EC tablet Take 1 tablet (50 mg total) by mouth daily as needed.  30 tablet  0  . glucose blood (ONE TOUCH TEST  STRIPS) test strip Use to check blood sugar three times a day  100 each  3  . guaiFENesin-codeine (ROBITUSSIN AC) 100-10 MG/5ML syrup Take 5 mLs by mouth 3 (three) times daily as needed for cough or congestion.  120 mL  0  . metFORMIN (GLUCOPHAGE) 1000 MG tablet TAKE 1 TABLET BY MOUTH 2 TIMES DAILY WITH A MEAL.  60 tablet  5  . olmesartan (BENICAR) 20 MG tablet Take 1 tablet (20 mg total) by mouth daily.  90 tablet  1  . ONE TOUCH LANCETS MISC Use to check blood sugar three time a day       . pantoprazole (PROTONIX) 40 MG tablet Take 1 tablet (40 mg total) by mouth daily.  90 tablet  3  . saxagliptin HCl (ONGLYZA) 5 MG TABS tablet Take 1 tablet (5 mg total) by mouth daily.  30 tablet  2  . [DISCONTINUED] Calcium Carbonate 1500 MG TABS Take 1,500 mg by mouth daily.          BP 130/88  Pulse 70  Temp 98 F  (36.7 C) (Oral)  Wt 256 lb (116.121 kg)  EKG shows normal sinus rhythm at 82 beats per minutes with occasional PACs.     Objective:   Physical Exam  Constitutional: She is oriented to person, place, and time. She appears well-developed and well-nourished.  HENT:  Head: Normocephalic and atraumatic.  Right Ear: External ear normal.  Left Ear: External ear normal.  Neck: Neck supple.  Cardiovascular: Normal rate, regular rhythm and normal heart sounds.   No murmur heard. Pulmonary/Chest: Effort normal and breath sounds normal. She has no wheezes.  Musculoskeletal: She exhibits no edema.  Lymphadenopathy:    She has no cervical adenopathy.  Neurological: She is alert and oriented to person, place, and time. No cranial nerve deficit.  Skin: Skin is warm and dry.  Psychiatric: She has a normal mood and affect.          Assessment & Plan:

## 2012-02-29 NOTE — Patient Instructions (Addendum)
Please contact our office if your symptoms do not improve or gets worse. Last weight 263 11/24/2011

## 2012-02-29 NOTE — Assessment & Plan Note (Signed)
Patient experiencing intermittent fluttering sensation in her chest. She does not have any associated dizziness or chest pain. EKG shows occasional PACs. Patient advised to decrease caffeine use and stop all over-the-counter diet supplements. If persistent symptoms consider repeat electrolytes, thyroid studies, and CBCD.

## 2012-03-01 ENCOUNTER — Other Ambulatory Visit: Payer: Self-pay | Admitting: *Deleted

## 2012-03-01 DIAGNOSIS — E785 Hyperlipidemia, unspecified: Secondary | ICD-10-CM

## 2012-03-01 MED ORDER — ATORVASTATIN CALCIUM 20 MG PO TABS: 20.0000 mg | ORAL_TABLET | Freq: Every day | ORAL | Status: DC

## 2012-03-09 ENCOUNTER — Other Ambulatory Visit: Payer: Self-pay

## 2012-03-12 ENCOUNTER — Other Ambulatory Visit: Payer: Self-pay | Admitting: Gynecology

## 2012-04-02 ENCOUNTER — Other Ambulatory Visit: Payer: Self-pay | Admitting: Internal Medicine

## 2012-04-02 ENCOUNTER — Ambulatory Visit (INDEPENDENT_AMBULATORY_CARE_PROVIDER_SITE_OTHER): Payer: Self-pay | Admitting: Family Medicine

## 2012-04-02 VITALS — BP 118/84 | Wt 246.0 lb

## 2012-04-02 DIAGNOSIS — E119 Type 2 diabetes mellitus without complications: Secondary | ICD-10-CM

## 2012-04-02 NOTE — Progress Notes (Signed)
Patient presents today for 3 month DM follow-up through the employer-sponsored Link to Wellness program. Current DM regimen includes Onglyza and Metformin. Patient last saw Dr. Artist Pais in February for evaluation of chest pain. No A1c was done at this time. EKG was normal and patient is to follow-up with Artist Pais if symptoms persist. Pt believes she was experiencing chest pain more often while on voltaren, but has since discontinued this medication. It is no longer needed as patient has increased exercise which has improved shoulder pain. No new medications or additional health issues.   Diabetes Assessment: Testing glucose 2-6 times a week; Highest CBG 212; Lowest CBG 130; A1c 9.0 (10/13), 9.3 (1/14), 7.3 (3/14) Other Diabetes History: Pt did not bring meter today but reports testing ~3 times per week, fasting. Per patient report glucose readings average 180-190. I have encouraged pt to begin testing at other times of the day to see if she is having only fasting hyperglycemia or if this is occuring all throughout the day. I have also suggested pt try taking Onglyza in evening with metformin dose for better breakfast coverage. Pt denies any numbness, tingling, burning, or signs of infection in feet and toes.   Lifestyle Factors: Diet - Patient has made significant dietary improvements and is now tracking calories, nutrition, and fitness through [employer] online fitness tool. She and her boyfriend have been usint this system since January. Calories consumed per day is currently averaging 1200-1250. Did not join weight watchers as this is an expensive service and patient did not feel it would benefit her any further than her online nutrition tool. Patient reports she has done a complete overhaul to her diet. She is replacing three breakfast meals per week with a protein shake. Has eliminated fastfood and has limited restaurants in general to no more than twice weekly (pt was eating out 5+ times per week). Patient is  also making better evening snack choices including veggie straws, and skinny pop. For breakfast patient is eating [employer] oatmeal and fruit such as orange cuties. She admits she still needs to work on portion size of evening snacks, but feels she has made significant improvements thus far.   Exercise - Patient and her boyfriend joined [employer] in [place], and are attending 3 times per week. They participate in circuit training for 35 min, then continue cardio exercise on treadmill or eliptical for an additional ~20-45 minutes, depending on level of fatigue.   Assessment: Patient has done a great job of regaining control of diabetes. A1c has decreased from 9.3 (1/14) to 7.3 today! Patient was extremely excited by this and motivated to continue her lifestyle changes. In addition, she has also lost ~20 lbs over the previous 6 months. Patient will continue modifications to diet and exericse with hopes of continuing weight loss and reaching goal A1c of less than 7.0  Plan: 1) Continue to maintain healthy dietary choices, continue making healthy choices when eating out and continue to limit evening snacking and making healthy snack choices 2) Cotninue exercising, great job with increasing this!! 3) Continue testing regularly, and attempt to test during the day or before bed to assess non-fasting numbers 4) Try taking Onglyza at bedtime to see if this will help fasting numbers 5) A1c today = 7.2 6) Return for follow-up in 3 months on Tuesday June 10th @ 11:00 am

## 2012-04-23 NOTE — Progress Notes (Signed)
Patient ID: Stacey Roberts, female   DOB: 06-Mar-1965, 47 y.o.   MRN: [id number] ATTENDING PHYSICIAN NOTE: I have reviewed the chart and agree with the plan as detailed above. Denny Levy MD Pager [phone]

## 2012-04-25 ENCOUNTER — Encounter: Payer: Self-pay | Admitting: Internal Medicine

## 2012-04-25 DIAGNOSIS — M79672 Pain in left foot: Secondary | ICD-10-CM

## 2012-04-26 ENCOUNTER — Ambulatory Visit (HOSPITAL_COMMUNITY)
Admission: RE | Admit: 2012-04-26 | Discharge: 2012-04-26 | Disposition: A | Discharge status: Home / Self Care | Payer: 59 | Source: Ambulatory Visit | Attending: Internal Medicine | Admitting: Internal Medicine

## 2012-04-26 DIAGNOSIS — M79609 Pain in unspecified limb: Secondary | ICD-10-CM | POA: Insufficient documentation

## 2012-04-26 DIAGNOSIS — M79672 Pain in left foot: Secondary | ICD-10-CM

## 2012-05-15 ENCOUNTER — Ambulatory Visit: Payer: Self-pay

## 2012-05-15 ENCOUNTER — Other Ambulatory Visit: Payer: Self-pay | Admitting: Occupational Medicine

## 2012-05-15 DIAGNOSIS — R7612 Nonspecific reaction to cell mediated immunity measurement of gamma interferon antigen response without active tuberculosis: Secondary | ICD-10-CM

## 2012-05-27 ENCOUNTER — Other Ambulatory Visit: Payer: Self-pay | Admitting: Internal Medicine

## 2012-06-20 ENCOUNTER — Ambulatory Visit (INDEPENDENT_AMBULATORY_CARE_PROVIDER_SITE_OTHER): Payer: 59 | Admitting: Endocrinology

## 2012-06-20 ENCOUNTER — Encounter: Payer: Self-pay | Admitting: Endocrinology

## 2012-06-20 VITALS — BP 120/80 | HR 80 | Ht 64.0 in | Wt 248.0 lb

## 2012-06-20 DIAGNOSIS — C73 Malignant neoplasm of thyroid gland: Secondary | ICD-10-CM

## 2012-06-20 NOTE — Progress Notes (Signed)
Subjective:    Patient ID: Stacey Roberts, female    DOB: July 09, 1965, 47 y.o.   MRN: [id number]  HPI Pt has 3 year h/o thyroid cancer.   She presented with a slight sensation of fullness at the anterior neck, but no assoc pain. 2/11: thyroidectomy: papillary adenocarcinoma T1b  N0 M0.  4/11: i-131 103 mci, with thyrogen.   12/11: neck US: single enlarged right cervical lymph node, nonspecific. 6/12: body scan (thyrogen) neg.   12/12: neck US: The lymph node described in the right neck was present previously by ultrasound and          is stable in size and appearance with prior measurements of approximately 2.8 x 1.1 x 1.5 cm.          This lymph node is likely benign. The soft tissue nodule in the left neck was also previously        visualized and may be slightly larger in volume. 6/13: A single right-sided cervical lymph node has similar morphology since the prior ultrasound study          and is slightly smaller in measurement. This lymph node measures approximately 1.7 x 1.1 x 1.3         cm by current ultrasound. Previous ultrasound demonstrated slightly larger dimensions of                     approximately 2.6 x 1.1 x 2.3 cm. This is clearly in the same region as the prior ultrasound and the         lymph node continues to show normal appearing lymph node architecture by ultrasound. A                  smaller 0.9 cm lower right cervical lymph node appears benign. Largest lymph node in the left               neck measures approximately 1.3 x 0.9 x 1.2 cm. This lymph node appears less prominent                    compared to the prior ultrasound. A second left-sided cervical lymph node measures            approximately 1.3 x 0.6 x 0.7 cm. This was not definitively measured previously. 12/13: thyroid US: 1. No further prominent right cervical lymph nodes. Single identifiable node is not          enlarged and has a benign appearance. 2. Possibly mildly increased maximal length of the dominant     left  cervical lymph node. Overall node morphology and volume show very little change over the last 6     months. The second identifiable left cervical lymph node shows reduction in size since the prior      Exam. She does not notice any lump in the neck.   Review of Systems denies weight loss, headache, hoarseness, palpitations, double vision, chest pain, sob, diarrhea, polyuria, myalgias, excessive diaphoresis, numbness, tremor, anxiety, and rhinorrhea.  She has irreg menses and easy bruising.    Objective:   Physical Exam VS: see vs page GEN: no distress HEAD: head: no deformity eyes: no periorbital swelling, no proptosis external nose and ears are normal mouth: no lesion seen NECK: supple, thyroid is not enlarged CHEST WALL: no deformity LUNGS:  Clear to auscultation CV: reg rate and rhythm, no murmur ABD: abdomen is soft, nontender.  no hepatosplenomegaly.  not  distended.  no hernia MUSCULOSKELETAL: muscle bulk and strength are grossly normal.  no obvious joint swelling.  gait is normal and steady EXTEMITIES: no deformity.  no ulcer on the feet.  feet are of normal color and temp.  no edema PULSES: dorsalis pedis intact bilat.  no carotid bruit NEURO:  cn 2-12 grossly intact.   readily moves all 4's.  sensation is intact to touch on the feet SKIN:  Normal texture and temperature.  No rash or suspicious lesion is visible.   NODES:  None palpable at the neck PSYCH: alert, oriented x3.  Does not appear anxious nor depressed.   Lab Results  Component Value Date   TSH 0.18* 06/20/2012      Assessment & Plan:  Papillary adenocarcinoma of the thyroid, stage-1 postsurgical hypothyroidism, well-replaced irreg menses, not thyroid-related

## 2012-06-20 NOTE — Patient Instructions (Addendum)
blood tests are being requested for you today.  We'll contact you with results. You can hold-off on further ultrasounds for now. Please come back for a follow-up appointment in 6 months.

## 2012-06-27 ENCOUNTER — Other Ambulatory Visit: Payer: Self-pay | Admitting: Gynecology

## 2012-07-02 ENCOUNTER — Ambulatory Visit (INDEPENDENT_AMBULATORY_CARE_PROVIDER_SITE_OTHER): Payer: Self-pay | Admitting: Family Medicine

## 2012-07-02 ENCOUNTER — Other Ambulatory Visit: Payer: Self-pay | Admitting: Internal Medicine

## 2012-07-02 VITALS — BP 124/82 | Wt 248.0 lb

## 2012-07-02 DIAGNOSIS — E119 Type 2 diabetes mellitus without complications: Secondary | ICD-10-CM

## 2012-07-02 NOTE — Progress Notes (Signed)
Patient presents today for 3 month diabetes follow-up as part of the employer-sponsored Link to Wellness program. Current diabetes regimen includes Onglyza and Metformin. Patient also continues on daily ASA and ARB. Patient has not had a recent follow-up with Dr. Artist Pais, but is due for one soon. She did have an endocrinology follow-up recently with new endo, Dr. Everardo All, for management of hypothyroidism. Patient discontinuied atorvastatin due to joint aches and since disconitnuing patient's pain has resolved. Patient will follow-up with Dr. Artist Pais for lipid panel evaluation.   Diabetes Assessment: Type of Diabetes: Type 2; MD managing Diabetes Thomos Lemons, MD; Sees Diabetes provider 2 times per year; checks feet daily; hypoglycemia frequency None reported; uses glucometer; takes medications as prescribed; takes an aspirin a day; A1c 7.5 (previously 7.3) Other Diabetes History: Current med regimen includes onglyza 5 mg with supper, and Metformin 1000 mg twice daily. She began taking onglyza with evening meal in an attempt to lower fastings, but this has been unsuccessful thus far. Patient does maintain good medication compliance. Patient did bring meter today but is currently testing fasting and occassionally post-prandial. Patient reports AM hyperglycemia with fasting glucose averaging 180-200. Hypoglycemia frequency is rare. Patient does demonstrate appropriate correction of hypoglycemia. Patient denies signs and symptoms of neuropathy including numbness/tingling/burning and symptoms of foot infection. Patient is not due for yearly eye exam, and had recent exam this past February, negative for retinopathy. A1c today was 7.5, previously 7.3.   Lifestyle Factors: Diet - Patient continues to work toward a healthier diet, but is becoming frustrated with inability to lose weight. A s a result, she has resumed eating out more often and admits to ","cheating"," on occassion. She is now using [employer] app to track  nutrition and exercise. Now attempting to limit carbs vs counting calories with goal of 165 g CHO per day or less. Yesterday carb intake was 105 g. Pt continues having breakfast protein shakes at least three times per week.  Exercise - Patient and her boyfriend still attend [employer] in [place], and are attempting 3 times per week, but reports they have slacked a little. They participate in circuit training for 35 min, then continue cardio exercise on treadmill or eliptical for an additional ~20-45 minutes, depending on level of fatigue. Will soon start a saturday morning ","hit - High intensity training"," class.  Assessment: Patient has put forth great effort at maintaining control of diabetes. A1c has increased slightly from 7.3 to 7.5 today. Patient was pleased with this but also disappointed at the increase. Patient will continue modifications to diet and exericse with hopes of continuing weight loss and reaching goal A1c of less than 7.0. I have encouraged her to keep trying despite the lack of weight loss, which can be very discouraging.   Plan: 1) Continue making healthy dietary choices and limiting carbs 2) Continue exercise at least three times weekly, and good luck with the new HIT (high intensity training) class 3) Continue testing regularly 4) Follow-up with Dr. Sharl Ma and Dr. Artist Pais, inquire about addition of Victoza 5) Follow-up in 3 months on Tuesday September 9th @ 11:00 am

## 2012-07-15 NOTE — Progress Notes (Signed)
Patient ID: Stacey Roberts, female   DOB: 10/17/1965, 47 y.o.   MRN: [id number] ATTENDING PHYSICIAN NOTE: I have reviewed the chart and agree with the plan as detailed above. Sara Neal MD Pager [phone]  

## 2012-07-25 ENCOUNTER — Encounter: Payer: Self-pay | Admitting: Internal Medicine

## 2012-07-25 ENCOUNTER — Telehealth: Payer: Self-pay | Admitting: Endocrinology

## 2012-07-25 MED ORDER — LEVOTHYROXINE SODIUM 175 MCG PO TABS: 175.0000 ug | ORAL_TABLET | Freq: Every day | ORAL | Status: DC

## 2012-07-25 NOTE — Telephone Encounter (Signed)
Needs med refills, Stacey Roberts pt / Sherri

## 2012-07-29 ENCOUNTER — Encounter: Payer: Self-pay | Admitting: Family Medicine

## 2012-07-29 ENCOUNTER — Ambulatory Visit (INDEPENDENT_AMBULATORY_CARE_PROVIDER_SITE_OTHER): Payer: 59 | Admitting: Family Medicine

## 2012-07-29 VITALS — BP 128/80 | Temp 97.8°F | Wt 251.0 lb

## 2012-07-29 DIAGNOSIS — R1033 Periumbilical pain: Secondary | ICD-10-CM

## 2012-07-29 NOTE — Progress Notes (Signed)
Chief Complaint  Patient presents with  . belly button pain and odor    HPI:  Acute visit for periumbilical pain: -had a little pain and odor when swabbed umbilicus last week -now symptoms resolved -hx of skin infection in this area years ago -denies drainage, fever, malaise  ROS: See pertinent positives and negatives per HPI.  Past Medical History  Diagnosis Date  . Diabetes mellitus, type II   . Obesity   . Hypertension   . Hyperlipidemia   . 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 Sharl Ma    Family History  Problem Relation Age of Onset  . Dementia Father     deceased age 73 secondary to dementia  . Bipolar disorder Father   . Emphysema Father     History   Social History  . Marital Status: Divorced    Spouse Name: N/A    Number of Children: N/A  . Years of Education: N/A   Social History Main Topics  . Smoking status: Former Games developer  . Smokeless tobacco: None     Comment: smoked on and off for 20 years  . Alcohol Use: No  . Drug Use: No  . Sexually Active: None   Other Topics Concern  . None   Social History Narrative   The patient is divorced, moved to [place] in   2006 from [place].  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    No alcohol.  Tobacco use:  She quit 1 year ago.  She smoked on   and off for approximately 20 years.  No history of recreational drug   use.    Current outpatient prescriptions:albuterol (PROVENTIL HFA;VENTOLIN HFA) 108 (90 BASE) MCG/ACT inhaler, Inhale 1-2 puffs into the lungs every 6 (six) hours as needed for wheezing or shortness of breath., Disp: 1 Inhaler, Rfl: 0;  aspirin 81 MG tablet, Take 81 mg by mouth daily.  , Disp: , Rfl: ;  BENICAR 20 MG tablet, TAKE 1 TABLET BY MOUTH DAILY., Disp: 90 tablet, Rfl: 3 diclofenac (VOLTAREN)  50 MG EC tablet, Take 1 tablet (50 mg total) by mouth daily as needed., Disp: 30 tablet, Rfl: 0;  glucose blood (ONE TOUCH TEST STRIPS) test strip, Use to check blood sugar three times a day, Disp: 100 each, Rfl: 3;  guaiFENesin-codeine (ROBITUSSIN AC) 100-10 MG/5ML syrup, Take 5 mLs by mouth 3 (three) times daily as needed for cough or congestion., Disp: 120 mL, Rfl: 0 levothyroxine (SYNTHROID, LEVOTHROID) 175 MCG tablet, Take 1 tablet (175 mcg total) by mouth daily., Disp: 90 tablet, Rfl: 1;  metFORMIN (GLUCOPHAGE) 1000 MG tablet, TAKE 1 TABLET BY MOUTH 2 TIMES DAILY WITH A MEAL., Disp: 60 tablet, Rfl: 11;  ONE TOUCH LANCETS MISC, Use to check blood sugar three time a day , Disp: , Rfl: ;  ONGLYZA 5 MG TABS tablet, TAKE 1 TABLET BY MOUTH DAILY., Disp: 30 tablet, Rfl: 11 pantoprazole (PROTONIX) 40 MG tablet, Take 1 tablet (40 mg total) by mouth daily., Disp: 90 tablet, Rfl: 3;  azithromycin (ZITHROMAX) 250 MG tablet, Take 1 tablet (250 mg total) by mouth daily. Take first 2 tablets together, then 1 every day until finished., Disp: 6 tablet, Rfl: 0;  [DISCONTINUED] Calcium Carbonate 1500 MG TABS, Take 1,500 mg by mouth daily.  , Disp: , Rfl:  EXAM:  Filed Vitals:   07/29/12 0801  BP: 128/80  Temp: 97.8 F (36.6 C)    Body mass index is 43.06 kg/(m^2).  GENERAL: vitals reviewed and listed above, alert, oriented, appears well hydrated and in no acute distress  HEENT: atraumatic, conjunttiva clear, no obvious abnormalities on inspection of external nose and ears  NECK: no obvious masses on inspection  ABD: obese, soft, NTTP  SKIN: no signs of infection at umbilicus  MS: moves all extremities without noticeable abnormality  PSYCH: pleasant and cooperative, no obvious depression or anxiety  ASSESSMENT AND PLAN:  Discussed the following assessment and plan:  Umbilical pain  -looks great today -skin care recs and return precautions required -Patient advised to return or notify a doctor  immediately if symptoms worsen or persist or new concerns arise.  There are no Patient Instructions on file for this visit.   Kriste Basque R.

## 2012-10-21 ENCOUNTER — Ambulatory Visit (INDEPENDENT_AMBULATORY_CARE_PROVIDER_SITE_OTHER): Payer: 59 | Admitting: Endocrinology

## 2012-10-21 ENCOUNTER — Encounter: Payer: Self-pay | Admitting: Endocrinology

## 2012-10-21 VITALS — BP 124/78 | HR 100 | Wt 255.0 lb

## 2012-10-21 DIAGNOSIS — IMO0001 Reserved for inherently not codable concepts without codable children: Secondary | ICD-10-CM

## 2012-10-21 DIAGNOSIS — C73 Malignant neoplasm of thyroid gland: Secondary | ICD-10-CM

## 2012-10-21 DIAGNOSIS — E890 Postprocedural hypothyroidism: Secondary | ICD-10-CM | POA: Insufficient documentation

## 2012-10-21 LAB — HEMOGLOBIN A1C: Hgb A1c MFr Bld: 8.8 % — ABNORMAL HIGH (ref 4.6–6.5)

## 2012-10-21 MED ORDER — CANAGLIFLOZIN 300 MG PO TABS: 1.0000 | ORAL_TABLET | Freq: Every day | ORAL | Status: DC

## 2012-10-21 MED ORDER — LEVOTHYROXINE SODIUM 150 MCG PO TABS: 150.0000 ug | ORAL_TABLET | Freq: Every day | ORAL | Status: DC

## 2012-10-21 NOTE — Patient Instructions (Addendum)
blood tests are being requested for you today.  We'll contact you with results. Please come back for a follow-up appointment in 6 months  

## 2012-10-21 NOTE — Progress Notes (Signed)
Subjective:    Patient ID: Stacey Roberts, female    DOB: 1965-02-05, 47 y.o.   MRN: [id number]  HPI Pt has h/o papillary adenocarcinoma of the thyroid.  She presented with a slight sensation of fullness at the anterior neck, but no assoc pain.   2/11: thyroidectomy: T1b N0 M0.  4/11: i-131 103 mci, with thyrogen.  12/11: neck US: single enlarged right cervical lymph node, nonspecific.   6/12: body scan (thyrogen) neg.   12/12: neck US: The lymph node described in the right neck was present previously by ultrasound and          is stable in size and appearance with prior measurements of approximately 2.8 x 1.1 x 1.5 cm.          This lymph node is likely benign. The soft tissue nodule in the left neck was also previously        visualized and may be slightly larger in volume. 6/13: A single right-sided cervical lymph node has similar morphology since the prior ultrasound study          and is slightly smaller in measurement. This lymph node measures approximately 1.7 x 1.1 x 1.3         cm by current ultrasound. Previous ultrasound demonstrated slightly larger dimensions of                     approximately 2.6 x 1.1 x 2.3 cm. This is clearly in the same region as the prior ultrasound and the         lymph node continues to show normal appearing lymph node architecture by ultrasound. A                  smaller 0.9 cm lower right cervical lymph node appears benign. Largest lymph node in the left               neck measures approximately 1.3 x 0.9 x 1.2 cm. This lymph node appears less prominent                    compared to the prior ultrasound. A second left-sided cervical lymph node measures            approximately 1.3 x 0.6 x 0.7 cm. This was not definitively measured previously. 12/13: thyroid US: 1. No further prominent right cervical lymph nodes. Single identifiable node is not          enlarged and has a benign appearance. 2. Possibly mildly increased maximal length of the dominant     left  cervical lymph node. Overall node morphology and volume show very little change over the last 6     months. The second identifiable left cervical lymph node shows reduction in size since the prior      exam. She does not notice any lump in the neck.  Review of Systems Denies weight change and hypoglycemia.     Objective:   Physical Exam VITAL SIGNS:  See vs page. GENERAL: no distress. Neck: a healed scar is present.  i do not appreciate a nodule in the thyroid or elsewhere in the neck.   Lab Results  Component Value Date   TSH 0.22* 10/21/2012   Lab Results  Component Value Date   HGBA1C 8.8* 10/21/2012      Assessment & Plan:  Stage 1 differentiated thyroid cancer, no clinical evidence of recurrence. Postsurgical hypothyroidism: given her disease-free interval, she can  reduce her synthroid to euthyroidism. DM: she needs increased rx

## 2012-10-22 ENCOUNTER — Encounter: Payer: Self-pay | Admitting: Endocrinology

## 2012-10-22 ENCOUNTER — Ambulatory Visit (INDEPENDENT_AMBULATORY_CARE_PROVIDER_SITE_OTHER): Payer: 59 | Admitting: Family Medicine

## 2012-10-22 VITALS — BP 132/90 | Wt 250.0 lb

## 2012-10-22 DIAGNOSIS — E119 Type 2 diabetes mellitus without complications: Secondary | ICD-10-CM

## 2012-10-22 LAB — THYROGLOBULIN ANTIBODY: Thyroglobulin Ab: <20 U/mL (ref <40.0)

## 2012-10-23 NOTE — Progress Notes (Signed)
Patient presents today for 3 month diabetes follow-up as part of the employer-sponsored [employer] program. Current diabetes regimen includes Onglyza, Metformin, and most recently Invokana. Patient also continues on daily ASA and ARB. Most recent MD follow-up was yesterday with Dr. Everardo All, endocrinologist. At this time labs were drawn and patient was prescribed Invokana due to elevated A1c of 8.8. Patient has a pending appt for 6 month follow-up.   Diabetes Assessment: Type of Diabetes: Type 2; MD managing Diabetes Dr. Everardo All, endo; checks feet daily; hypoglycemia frequency None reported; uses glucometer; takes medications as prescribed; takes an aspirin a day; Highest CBG 246; A1c 8.8, 10/21/12 Other Diabetes History:  Current med regimen includes onglyza 5 mg with supper, and Metformin 1000 mg twice daily, and most recently Invokana 300 mg daily. Invokana was added by Dr. Everardo All in response to an elevated A1c of 8.8 as of 10/21/12. Patient does maintain good medication compliance. Patient did not bring meter today but is currently testing 1-2 times per week, but is aware of the need for more frequent testing. Pt reports hyperglycemia with fasting glucose usually >200. Hypoglycemia frequency is rare. Patient does demonstrate appropriate correction of hypoglycemia. Patient denies signs and symptoms of neuropathy including numbness/tingling/burning and symptoms of foot infection. Patient is not due for yearly eye exam, and had recent exam this past February, negative for retinopathy.   Lifestyle Factors: Diet - Patient admits that her diet has deteriorated since last visit. Patient attended a large [place] wedding in [place] for her neice and reports indulging in much food and drink. This splurge wrecked her dietary plan and she has struggled since then to get back on track. As of last week she has resumed using Myfitness pal app, and is attempting to adhere to calorie and carbohydrate limitations. She is  limiting carbs to approximately 45 g per meal, and calories to approximately 1900 per day according to her fitness pal app and her personal trainer at the gym. Pt seems motivated.  Exercise - Very little exercise recently but did resume this past Thursday after recieving a call from a trainer at the gym. Patient and her boyfriend will attempt to attend daily for 30-60 minutes, doing a combination of cardio and weight/resistance training.   Assessment: Patient has allowed lifestyle and DM control to deteriorate over the past 3 months. A1c has increased from 7.5 to 8.8 according to recent labwork. Patient was disappointed by this and seems motivated to make necessary changes. As of last week patient has resumed exercise and dietary restrictions. I have encouraged her to keep up the good work and to increase frequency of testing. She will continue to work on these goals over the coming months. I will follow-up in 3 months. If A1c remains elevated at next visit we will have patient return in 6 weeks.   Plan: 1) Continue exercising, great job with getting back on track with this!  2) Continue making healthy dietary choices and limiting carbohydrates 3) Resume testing glucose at least once daily 4) A1c = 8.8 (up from 7.5) 5) Follow-up in 3 months on Tuesday December 30th @ 11:00 am

## 2012-10-29 ENCOUNTER — Ambulatory Visit (INDEPENDENT_AMBULATORY_CARE_PROVIDER_SITE_OTHER): Payer: 59 | Admitting: Cardiology

## 2012-10-29 ENCOUNTER — Encounter: Payer: Self-pay | Admitting: Cardiology

## 2012-10-29 VITALS — BP 122/76 | HR 97 | Ht 64.0 in | Wt 256.0 lb

## 2012-10-29 DIAGNOSIS — E785 Hyperlipidemia, unspecified: Secondary | ICD-10-CM

## 2012-10-29 DIAGNOSIS — R002 Palpitations: Secondary | ICD-10-CM

## 2012-10-29 NOTE — Progress Notes (Signed)
HPI The patient has no prior cardiac history.  She has had palpitations since February.  She feels this sometimes for days in a row.  It might go away for weeks at a time. She feels palpitations that might be a skipping every other beat.  She might get presyncopal and have some SOB.  The patient denies any new symptoms such as chest discomfort, neck or arm discomfort. There has been no orthopnea or PND. There has been no syncope.  She is active and takes the stairs at times and exercises at times.  She cannot bring on these symptoms with this activity.   She does drink caffeine about two cups per day.  Of note she has had no prior cardiac work up.  However, she does have a strong family history of CAD.   Allergies  Allergen Reactions  . Penicillins Shortness Of Breath and Rash  . Ace Inhibitors     REACTION: cough    Current Outpatient Prescriptions  Medication Sig Dispense Refill  . aspirin 81 MG tablet Take 81 mg by mouth daily.        Marland Kitchen BENICAR 20 MG tablet TAKE 1 TABLET BY MOUTH DAILY.  90 tablet  3  . glucose blood (ONE TOUCH TEST STRIPS) test strip Use to check blood sugar three times a day  100 each  3  . levothyroxine (SYNTHROID, LEVOTHROID) 150 MCG tablet Take 150 mcg by mouth daily before breakfast.      . metFORMIN (GLUCOPHAGE) 1000 MG tablet TAKE 1 TABLET BY MOUTH 2 TIMES DAILY WITH A MEAL.  60 tablet  11  . ONE TOUCH LANCETS MISC Use to check blood sugar three time a day       . ONGLYZA 5 MG TABS tablet TAKE 1 TABLET BY MOUTH DAILY.  30 tablet  11  . pantoprazole (PROTONIX) 40 MG tablet Take 1 tablet (40 mg total) by mouth daily.  90 tablet  3  . Canagliflozin (INVOKANA) 300 MG TABS Take 1 tablet (300 mg total) by mouth daily.  30 tablet  11  . [DISCONTINUED] Calcium Carbonate 1500 MG TABS Take 1,500 mg by mouth daily.         No current facility-administered medications for this visit.    Past Medical History  Diagnosis Date  . Diabetes mellitus, type II   . Obesity     . Hypertension   . Hyperlipidemia   . 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 Sharl Ma    Past Surgical History  Procedure Laterality Date  . Breast biopsy  2003    s/p  . Tonsillectomy  1982  . Dilation and curettage of uterus  2004    s/p  . Thyroidectomy  02/2009    Dr Gerrit Friends    Family History  Problem Relation Age of Onset  . Dementia Father     deceased age 47 secondary to dementia  . Bipolar disorder Father   . Emphysema Father     History   Social History  . Marital Status: Divorced    Spouse Name: N/A    Number of Children: N/A  . Years of Education: N/A   Occupational History  . Not on file.   Social History Main Topics  . Smoking status: Former Games developer  . Smokeless tobacco: Not on file     Comment: smoked on and off for 20 years  . Alcohol  Use: No  . Drug Use: No  . Sexual Activity: Not on file   Other Topics Concern  . Not on file   Social History Narrative   The patient is divorced, moved to [place] in   2006 from [place].  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    No alcohol.  Tobacco use:  She quit 1 year ago.  She smoked on   and off for approximately 20 years.  No history of recreational drug   use.    ROS:  Chronic back pain.  Otherwise as stated in the HPI and negative for all other systems.  PHYSICAL EXAM BP 122/76  Pulse 97  Ht 5\' 4"  (1.626 m)  Wt 256 lb (116.121 kg)  BMI 43.92 kg/m2 GENERAL:  Well appearing HEENT:  Pupils equal round and reactive, fundi not visualized, oral mucosa unremarkable NECK:  No jugular venous distention, waveform within normal limits, carotid upstroke brisk and symmetric, no bruits, no thyromegaly LYMPHATICS:  No cervical, inguinal adenopathy LUNGS:  Clear to auscultation bilaterally BACK:  No CVA  tenderness CHEST:  Unremarkable HEART:  PMI not displaced or sustained,S1 and S2 within normal limits, no S3, no S4, no clicks, no rubs, no murmurs ABD:  Flat, positive bowel sounds normal in frequency in pitch, no bruits, no rebound, no guarding, no midline pulsatile mass, no hepatomegaly, no splenomegaly EXT:  2 plus pulses throughout, no edema, no cyanosis no clubbing SKIN:  No rashes no nodules NEURO:  Cranial nerves II through XII grossly intact, motor grossly intact throughout PSYCH:  Cognitively intact, oriented to person place and time   EKG:  NSR, atrial bigeminy, axis WNL, intervals WNL, no acute ST T wave changes.  10/29/2012  ASSESSMENT AND PLAN  ATRIAL BIGEMINY:   I will check an echocardiogram.  We discussed possible treatments with beta blockers.  She will first consider stopping her caffeine intake.  She will let me know if she wants other therapies.    FAMILY HISTORY OF CAD:  She has a strong family history of CAD.  I will bring the patient back for a POET (Plain Old Exercise Test). This will allow me to screen for obstructive coronary disease, risk stratify and very importantly provide a prescription for exercise.  OBESITY:  The patient understands the need to lose weight with diet and exercise. We have discussed specific strategies for this.

## 2012-10-29 NOTE — Patient Instructions (Addendum)
The current medical regimen is effective;  continue present plan and medications.  Please return fasting for blood work  (lipid)  Your physician has requested that you have an exercise tolerance test. For further information please visit [email or web address] Please also follow instruction sheet, as given.  Your physician has requested that you have an echocardiogram. Echocardiography is a painless test that uses sound waves to create images of your heart. It provides your doctor with information about the size and shape of your heart and how well your heart's chambers and valves are working. This procedure takes approximately one hour. There are no restrictions for this procedure.

## 2012-11-05 ENCOUNTER — Ambulatory Visit: Payer: Self-pay | Admitting: Emergency Medicine

## 2012-11-05 LAB — RAPID STREP-A WITH REFLX: Micro Text Report: POSITIVE

## 2012-11-26 ENCOUNTER — Other Ambulatory Visit: Payer: Self-pay | Admitting: Internal Medicine

## 2012-11-26 DIAGNOSIS — C73 Malignant neoplasm of thyroid gland: Secondary | ICD-10-CM

## 2012-11-28 ENCOUNTER — Other Ambulatory Visit: Payer: Self-pay

## 2012-11-28 ENCOUNTER — Other Ambulatory Visit (INDEPENDENT_AMBULATORY_CARE_PROVIDER_SITE_OTHER): Payer: 59

## 2012-11-28 ENCOUNTER — Ambulatory Visit (INDEPENDENT_AMBULATORY_CARE_PROVIDER_SITE_OTHER): Payer: 59 | Admitting: Physician Assistant

## 2012-11-28 ENCOUNTER — Ambulatory Visit (HOSPITAL_COMMUNITY): Payer: 59 | Attending: Internal Medicine | Admitting: Radiology

## 2012-11-28 DIAGNOSIS — E669 Obesity, unspecified: Secondary | ICD-10-CM | POA: Insufficient documentation

## 2012-11-28 DIAGNOSIS — Z6841 Body Mass Index (BMI) 40.0 and over, adult: Secondary | ICD-10-CM | POA: Insufficient documentation

## 2012-11-28 DIAGNOSIS — E119 Type 2 diabetes mellitus without complications: Secondary | ICD-10-CM | POA: Insufficient documentation

## 2012-11-28 DIAGNOSIS — R002 Palpitations: Secondary | ICD-10-CM | POA: Insufficient documentation

## 2012-11-28 DIAGNOSIS — E785 Hyperlipidemia, unspecified: Secondary | ICD-10-CM | POA: Insufficient documentation

## 2012-11-28 DIAGNOSIS — I10 Essential (primary) hypertension: Secondary | ICD-10-CM | POA: Insufficient documentation

## 2012-11-28 LAB — LIPID PANEL
Cholesterol: 216 mg/dL — ABNORMAL HIGH (ref 0–200)
Triglycerides: 221 mg/dL — ABNORMAL HIGH (ref 0.0–149.0)
VLDL: 44.2 mg/dL — ABNORMAL HIGH (ref 0.0–40.0)

## 2012-11-28 NOTE — Progress Notes (Signed)
Exercise Treadmill Test  Pre-Exercise Testing Evaluation Rhythm: sinus tachycardia  Rate: 91 bpm     Test  Exercise Tolerance Test Ordering MD: Angelina Sheriff, MD  Interpreting MD: Tereso Newcomer, PA-C  Unique Test No: 1  Treadmill:  1  Indication for ETT: palpitations  Contraindication to ETT: No   Stress Modality: exercise - treadmill  Cardiac Imaging Performed: non   Protocol: standard Bruce - maximal  Max BP:  168/72  Max MPHR (bpm):  173 85% MPR (bpm):  147  MPHR obtained (bpm):  176 % MPHR obtained:  101  Reached 85% MPHR (min:sec):  3:27 Total Exercise Time (min-sec):  7:04  Workload in METS:  8.3 Borg Scale: 15  Reason ETT Terminated:  patient's desire to stop    ST Segment Analysis At Rest: normal ST segments - no evidence of significant ST depression With Exercise: non-specific ST changes  Other Information Arrhythmia:  No Angina during ETT:  absent (0) Quality of ETT:  diagnostic  ETT Interpretation:  normal - no evidence of ischemia by ST analysis  Comments: Good exercise capacity. No chest pain. Normal BP response to exercise. No significant ST changes to suggest ischemia.  Treadmill malfunction at end of test precluded ability to assess HR recovery at 2 mph on 2% grade.  Recommendations: F/u with Dr. Rollene Rotunda as directed. Signed,  Tereso Newcomer, PA-C   11/28/2012 11:06 AM

## 2012-11-28 NOTE — Progress Notes (Signed)
Echocardiogram performed.  

## 2012-11-29 ENCOUNTER — Other Ambulatory Visit: Payer: Self-pay

## 2012-12-04 ENCOUNTER — Encounter: Payer: Self-pay | Admitting: Cardiology

## 2012-12-13 ENCOUNTER — Encounter: Payer: Self-pay | Admitting: Pulmonary Disease

## 2012-12-17 ENCOUNTER — Other Ambulatory Visit: Payer: Self-pay | Admitting: Gynecology

## 2012-12-17 DIAGNOSIS — N6452 Nipple discharge: Secondary | ICD-10-CM

## 2012-12-24 ENCOUNTER — Ambulatory Visit
Admission: RE | Admit: 2012-12-24 | Discharge: 2012-12-24 | Disposition: A | Discharge status: Home / Self Care | Payer: 59 | Source: Ambulatory Visit | Attending: Gynecology | Admitting: Gynecology

## 2012-12-24 ENCOUNTER — Other Ambulatory Visit: Payer: Self-pay | Admitting: Gynecology

## 2012-12-24 DIAGNOSIS — N6452 Nipple discharge: Secondary | ICD-10-CM

## 2012-12-31 ENCOUNTER — Ambulatory Visit (INDEPENDENT_AMBULATORY_CARE_PROVIDER_SITE_OTHER): Payer: Commercial Managed Care - PPO | Admitting: Surgery

## 2012-12-31 ENCOUNTER — Encounter (INDEPENDENT_AMBULATORY_CARE_PROVIDER_SITE_OTHER): Payer: Self-pay | Admitting: Surgery

## 2012-12-31 VITALS — BP 158/100 | HR 100 | Temp 98.6°F | Resp 16 | Ht 64.0 in | Wt 260.4 lb

## 2012-12-31 DIAGNOSIS — N6459 Other signs and symptoms in breast: Secondary | ICD-10-CM

## 2012-12-31 DIAGNOSIS — N6452 Nipple discharge: Secondary | ICD-10-CM

## 2012-12-31 NOTE — Progress Notes (Signed)
Patient ID: Stacey Roberts, female   DOB: 1965/04/02, 47 y.o.   MRN: [id number]  Chief Complaint  Patient presents with  . New Evaluation    eval lft br papilloma    HPI Stacey Roberts is a 47 y.o. female.   HPI She is referred by Dr.Yoo and Dr. Judyann Munson for left breast bloody nipple discharge. She had a mammogram and ductogram performed with suggest an intraductal mass centrally in the left breast. She has been having bloody nipple discharge for approximately 2 weeks which is spontaneous and management. She is otherwise without complaints. Past Medical History  Diagnosis Date  . Diabetes mellitus, type II   . Obesity   . Hypertension   . Hyperlipidemia   . 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 Sharl Ma    Past Surgical History  Procedure Laterality Date  . Breast biopsy  2003    s/p  . Tonsillectomy  1982  . Dilation and curettage of uterus  2004    s/p  . Thyroidectomy  02/2009    Dr Gerrit Friends    Family History  Problem Relation Age of Onset  . Dementia Father     deceased age 38 secondary to dementia  . Bipolar disorder Father   . Emphysema Father   . Heart disease Father   . CAD Mother 69    Died age 50 of CAD  . Heart disease Mother   . CAD Maternal Grandfather 56    Social History History  Substance Use Topics  . Smoking status: Former Smoker    Types: Cigarettes  . Smokeless tobacco: Never Used     Comment: Smoked on and off for 20 years. Quit about 5 years ago.  . Alcohol Use: No    Allergies  Allergen Reactions  . Penicillins Shortness Of Breath and Rash  . Ace Inhibitors     REACTION: cough    Current Outpatient Prescriptions  Medication Sig Dispense Refill  . aspirin 81 MG tablet Take 81 mg by mouth daily.        Marland Kitchen BENICAR 20 MG tablet TAKE 1 TABLET BY MOUTH DAILY.  90 tablet  3  . glucose blood (ONE TOUCH TEST STRIPS) test strip Use to check blood  sugar three times a day  100 each  3  . levothyroxine (SYNTHROID, LEVOTHROID) 150 MCG tablet Take 150 mcg by mouth daily before breakfast.      . metFORMIN (GLUCOPHAGE) 1000 MG tablet TAKE 1 TABLET BY MOUTH 2 TIMES DAILY WITH A MEAL.  60 tablet  11  . ONE TOUCH LANCETS MISC Use to check blood sugar three time a day       . ONGLYZA 5 MG TABS tablet TAKE 1 TABLET BY MOUTH DAILY.  30 tablet  11  . pantoprazole (PROTONIX) 40 MG tablet Take 1 tablet (40 mg total) by mouth daily.  90 tablet  3  . Canagliflozin (INVOKANA) 300 MG TABS Take 1 tablet (300 mg total) by mouth daily.  30 tablet  11  . [DISCONTINUED] Calcium Carbonate 1500 MG TABS Take 1,500 mg by mouth daily.         No current facility-administered medications for this visit.    Review of Systems Review of Systems  Constitutional: Negative for fever, chills and unexpected weight change.  HENT: Negative for congestion, hearing loss, sore throat, trouble swallowing and voice change.   Eyes: Negative  for visual disturbance.  Respiratory: Negative for cough and wheezing.   Cardiovascular: Negative for chest pain, palpitations and leg swelling.  Gastrointestinal: Negative for nausea, vomiting, abdominal pain, diarrhea, constipation, blood in stool, abdominal distention and anal bleeding.  Genitourinary: Negative for hematuria, vaginal bleeding and difficulty urinating.  Musculoskeletal: Negative for arthralgias.  Skin: Negative for rash and wound.  Neurological: Negative for seizures, syncope and headaches.  Hematological: Negative for adenopathy. Does not bruise/bleed easily.  Psychiatric/Behavioral: Negative for confusion.    Blood pressure 158/100, pulse 100, temperature 98.6 F (37 C), temperature source Temporal, resp. rate 16, height 5\' 4"  (1.626 m), weight 260 lb 6.4 oz (118.117 kg).  Physical Exam Physical Exam  Constitutional: She is oriented to person, place, and time. She appears well-developed and well-nourished. No  distress.  HENT:  Head: Normocephalic and atraumatic.  Right Ear: External ear normal.  Left Ear: External ear normal.  Mouth/Throat: No oropharyngeal exudate.  Eyes: Conjunctivae are normal. Pupils are equal, round, and reactive to light.  Neck: Normal range of motion. Neck supple. No tracheal deviation present.  Cardiovascular: Normal rate, regular rhythm and normal heart sounds.   No murmur heard. Pulmonary/Chest: Effort normal and breath sounds normal. No respiratory distress. She has no wheezes.  Abdominal: Soft.  Musculoskeletal: Normal range of motion. She exhibits no edema and no tenderness.  Lymphadenopathy:    She has no cervical adenopathy.    She has no axillary adenopathy.  Neurological: She is alert and oriented to person, place, and time.  Skin: Skin is warm and dry. No rash noted. No erythema.  Psychiatric: Her behavior is normal. Judgment normal.  Breasts: There is no left breast mass palpable. There is bloody discharge at the nipple  Data Reviewed I have her mammogram, ultrasound, and ductogram demonstrating the central mass in the duct approximately 6 cm deep to the nipple  Assessment     left breast Bloody nipple discharge with intraductal lesion     Plan    Left breast lumpectomy is recommended for histologic evaluation to rule out malignancy. I have discussed this with her in detail. I discussed the risks of surgery which includes but not limited to bleeding, infection, need for further surgery if malignancy is present, et Karie Soda. She understands and wishes to proceed. Surgery will be scheduled        Hardin Hardenbrook A 12/31/2012, 11:50 AM

## 2013-01-02 ENCOUNTER — Encounter (HOSPITAL_COMMUNITY): Payer: Self-pay | Admitting: Pharmacy Technician

## 2013-01-02 NOTE — Pre-Procedure Instructions (Signed)
Stacey Roberts  01/02/2013   Your procedure is scheduled on:  Monday January 06, 2013 @ 9:05-9:57.  Report to [facility] Short Stay Entrance "A" Admitting at 7:00 AM.  Call this number if you have problems the morning of surgery: [phone]   Remember:   Do not eat food or drink liquids after midnight.   Take these medicines the morning of surgery with A SIP OF WATER: Levothyroxine (Synthroid), Pantoprazole (Protonix)    Do NOT take any diabetic medications the morning of your surgery    Discontinue aspirin and herbal medications 5 days prior to surgery   Do not wear jewelry, make-up or nail polish.  Do not wear lotions, powders, or perfumes. You may wear deodorant.  Do not shave 48 hours prior to surgery. .  Do not bring valuables to the hospital.  [facility] is not responsible for any belongings or valuables.               Contacts, dentures or bridgework may not be worn into surgery.  Leave suitcase in the car. After surgery it may be brought to your room.  For patients admitted to the hospital, discharge time is determined by your treatment team.               Patients discharged the day of surgery will not be allowed to drive home.  Name and phone number of your driver: Family/Friend  Special Instructions: Shower using CHG 2 nights before surgery and the night before surgery.  If you shower the day of surgery use CHG.  Use special wash - you have one bottle of CHG for all showers.  You should use approximately 1/3 of the bottle for each shower.   Please read over the following fact sheets that you were given: Pain Booklet, Coughing and Deep Breathing and Surgical Site Infection Prevention

## 2013-01-03 ENCOUNTER — Encounter (HOSPITAL_COMMUNITY)
Admission: RE | Admit: 2013-01-03 | Discharge: 2013-01-03 | Disposition: A | Discharge status: Home / Self Care | Payer: 59 | Source: Ambulatory Visit | Attending: Surgery | Admitting: Surgery

## 2013-01-03 ENCOUNTER — Encounter (HOSPITAL_COMMUNITY): Payer: Self-pay

## 2013-01-03 HISTORY — DX: Snoring: R06.83

## 2013-01-03 HISTORY — DX: Bronchitis, not specified as acute or chronic: J40

## 2013-01-03 HISTORY — DX: Hypothyroidism, unspecified: E03.9

## 2013-01-03 LAB — BASIC METABOLIC PANEL
Creatinine, Ser: 0.51 mg/dL (ref 0.50–1.10)
GFR calc Af Amer: >90 mL/min (ref >90)
GFR calc non Af Amer: >90 mL/min (ref >90)
Glucose, Bld: 308 mg/dL — ABNORMAL HIGH (ref 70–99)
Potassium: 4.7 mEq/L (ref 3.5–5.1)
Sodium: 134 mEq/L — ABNORMAL LOW (ref 135–145)

## 2013-01-03 LAB — CBC
Hemoglobin: 13 g/dL (ref 12.0–15.0)
RBC: 4.95 MIL/uL (ref 3.87–5.11)
WBC: 8.6 10*3/uL (ref 4.0–10.5)

## 2013-01-03 LAB — HCG, SERUM, QUALITATIVE: Preg, Serum: NEGATIVE

## 2013-01-03 NOTE — Progress Notes (Signed)
Patient informed Nurse that she had a stress test in November 2014 and a Echo. Patient denied having any cardiac or pulmonary issues. PCP is Dr. Artist Pais. Patient also informed Nurse that she had a sleep study in 2006 but does not wear a CPAP machine but does snore very loudly per Fiance. Last 2 view chest xray was 04/04/11 and last 1 view chest was 05/15/12. Nurse discussed this with Revonda Standard, PA and Revonda Standard stated that one view chest xray would be fine. Nurse informed patient of this and told patient that if assigned Anesthesiologist wanted a chest xray the DOS then it would be obtained. Patient verbalized understanding.

## 2013-01-03 NOTE — Progress Notes (Signed)
Anesthesia Chart Review:  Patient is a 47 year old female scheduled for left breast lumpectomy for left nipple discharge on 01/06/13 by Dr. [name].  Her PAT was this morning.  Chart was not brought to me to review until after 4PM.  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  History includes morbid obesity, former smoker, HLD, DM2, thyroid cancer s/p thyroidectomy '11 and radioactive iodine ablation with secondary hypothyroidism, HTN, snoring.  PCP is listed as Dr. Jennette Dubin. Endocrinologist is Dr. Everardo All.  She was evaluated by cardiologist Dr. Antoine Poche on 10/29/12 for palpitations and had an echo and ETT (see below).    EKG: NSR, atrial bigeminy, axis WNL, intervals WNL, no acute ST T wave changes. 10/29/2012.  Echo on 11/28/12 showed: - Left ventricle: The cavity size was normal. Wall thickness was increased in a pattern of mild LVH. The estimated ejection fraction was 60%. Wall motion was normal; there were no regional wall motion abnormalities. - Right ventricle: The cavity size was normal. Systolic function was normal.  11/28/12 ETT Interpretation: normal - no evidence of ischemia by ST analysis  Comments:  Good exercise capacity.  No chest pain.  Normal BP response to exercise.  No significant ST changes to suggest ischemia.  Treadmill malfunction at end of test precluded ability to assess HR recovery at 2 mph on 2% grade.  1V CXR on 05/15/12: Findings: Cardiopericardial silhouette within normal limits. Mediastinal contours normal. Trachea midline. No airspace disease or effusion. No calcified granulomas. No adenopathy. IMPRESSION: No evidence of pulmonary tuberculosis.  Preoperative labs noted.  Non-fasting glucose was 308.  A1C was 8.8 on 10/21/12. I called patient. She had drank 3 cups of coffee and ate a orange about one hour before her PAT appointment.  Her fasting glucose levels are typically ~ 160 -175.    She reports fasting glucose readings < 200.  She had recent  unremarkable cardiac testing.  She will get a fasting CBG on arrival.  If results are reasonable and otherwise no acute changes then I would anticipate that she could proceed as planned.  Velna Ochs [facility] Short Stay Center/Anesthesiology Phone [phone] 01/03/2013 4:44 PM

## 2013-01-05 MED ORDER — CIPROFLOXACIN IN D5W 400 MG/200ML IV SOLN
400.0000 mg | INTRAVENOUS | Status: AC
  Administered 2013-01-06: 400 mg via INTRAVENOUS
  Filled 2013-01-05: qty 200

## 2013-01-05 NOTE — H&P (Signed)
Chief Complaint   Patient presents with   .  New Evaluation     eval lft br papilloma   HPI  Stacey Roberts is a 47 y.o. female.  HPI  She is referred by Dr.Yoo and Dr. Judyann Munson for left breast bloody nipple discharge. She had a mammogram and ductogram performed with suggest an intraductal mass centrally in the left breast. She has been having bloody nipple discharge for approximately 2 weeks which is spontaneous and management. She is otherwise without complaints.  Past Medical History   Diagnosis  Date   .  Diabetes mellitus, type II    .  Obesity    .  Hypertension    .  Hyperlipidemia    .  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 Sharl Ma    Past Surgical History   Procedure  Laterality  Date   .  Breast biopsy   2003     s/p   .  Tonsillectomy   1982   .  Dilation and curettage of uterus   2004     s/p   .  Thyroidectomy   02/2009     Dr Gerrit Friends    Family History   Problem  Relation  Age of Onset   .  Dementia  Father      deceased age 46 secondary to dementia   .  Bipolar disorder  Father    .  Emphysema  Father    .  Heart disease  Father    .  CAD  Mother  28     Died age 39 of CAD   .  Heart disease  Mother    .  CAD  Maternal Grandfather  20   Social History  History   Substance Use Topics   .  Smoking status:  Former Smoker     Types:  Cigarettes   .  Smokeless tobacco:  Never Used      Comment: Smoked on and off for 20 years. Quit about 5 years ago.   .  Alcohol Use:  No    Allergies   Allergen  Reactions   .  Penicillins  Shortness Of Breath and Rash   .  Ace Inhibitors      REACTION: cough    Current Outpatient Prescriptions   Medication  Sig  Dispense  Refill   .  aspirin 81 MG tablet  Take 81 mg by mouth daily.     Marland Kitchen  BENICAR 20 MG tablet  TAKE 1 TABLET BY MOUTH DAILY.  90 tablet  3   .  glucose blood (ONE TOUCH TEST STRIPS) test strip  Use to check  blood sugar three times a day  100 each  3   .  levothyroxine (SYNTHROID, LEVOTHROID) 150 MCG tablet  Take 150 mcg by mouth daily before breakfast.     .  metFORMIN (GLUCOPHAGE) 1000 MG tablet  TAKE 1 TABLET BY MOUTH 2 TIMES DAILY WITH A MEAL.  60 tablet  11   .  ONE TOUCH LANCETS MISC  Use to check blood sugar three time a day     .  ONGLYZA 5 MG TABS tablet  TAKE 1 TABLET BY MOUTH DAILY.  30 tablet  11   .  pantoprazole (PROTONIX) 40 MG tablet  Take 1 tablet (40 mg total) by mouth daily.  90 tablet  3   .  Canagliflozin (INVOKANA) 300 MG TABS  Take 1 tablet (300 mg total) by mouth daily.  30 tablet  11   .  [DISCONTINUED] Calcium Carbonate 1500 MG TABS  Take 1,500 mg by mouth daily.      No current facility-administered medications for this visit.   Review of Systems  Review of Systems  Constitutional: Negative for fever, chills and unexpected weight change.  HENT: Negative for congestion, hearing loss, sore throat, trouble swallowing and voice change.  Eyes: Negative for visual disturbance.  Respiratory: Negative for cough and wheezing.  Cardiovascular: Negative for chest pain, palpitations and leg swelling.  Gastrointestinal: Negative for nausea, vomiting, abdominal pain, diarrhea, constipation, blood in stool, abdominal distention and anal bleeding.  Genitourinary: Negative for hematuria, vaginal bleeding and difficulty urinating.  Musculoskeletal: Negative for arthralgias.  Skin: Negative for rash and wound.  Neurological: Negative for seizures, syncope and headaches.  Hematological: Negative for adenopathy. Does not bruise/bleed easily.  Psychiatric/Behavioral: Negative for confusion.  Blood pressure 158/100, pulse 100, temperature 98.6 F (37 C), temperature source Temporal, resp. rate 16, height 5\' 4"  (1.626 m), weight 260 lb 6.4 oz (118.117 kg).  Physical Exam  Physical Exam  Constitutional: She is oriented to person, place, and time. She appears well-developed and  well-nourished. No distress.  HENT:  Head: Normocephalic and atraumatic.  Right Ear: External ear normal.  Left Ear: External ear normal.  Mouth/Throat: No oropharyngeal exudate.  Eyes: Conjunctivae are normal. Pupils are equal, round, and reactive to light.  Neck: Normal range of motion. Neck supple. No tracheal deviation present.  Cardiovascular: Normal rate, regular rhythm and normal heart sounds.  No murmur heard.  Pulmonary/Chest: Effort normal and breath sounds normal. No respiratory distress. She has no wheezes.  Abdominal: Soft.  Musculoskeletal: Normal range of motion. She exhibits no edema and no tenderness.  Lymphadenopathy:  She has no cervical adenopathy.  She has no axillary adenopathy.  Neurological: She is alert and oriented to person, place, and time.  Skin: Skin is warm and dry. No rash noted. No erythema.  Psychiatric: Her behavior is normal. Judgment normal.  Breasts: There is no left breast mass palpable. There is bloody discharge at the nipple  Data Reviewed  I have her mammogram, ultrasound, and ductogram demonstrating the central mass in the duct approximately 6 cm deep to the nipple  Assessment  left breast Bloody nipple discharge with intraductal lesion  Plan  Left breast lumpectomy is recommended for histologic evaluation to rule out malignancy. I have discussed this with her in detail. I discussed the risks of surgery which includes but not limited to bleeding, infection, need for further surgery if malignancy is present, et Karie Soda. She understands and wishes to proceed. Surgery will be scheduled

## 2013-01-06 ENCOUNTER — Encounter (HOSPITAL_COMMUNITY)
Admission: RE | Disposition: A | Discharge status: Home / Self Care | Payer: Self-pay | Source: Ambulatory Visit | Attending: Surgery

## 2013-01-06 ENCOUNTER — Encounter (HOSPITAL_COMMUNITY): Payer: Self-pay | Admitting: *Deleted

## 2013-01-06 ENCOUNTER — Ambulatory Visit (HOSPITAL_COMMUNITY): Payer: 59 | Admitting: Certified Registered"

## 2013-01-06 ENCOUNTER — Ambulatory Visit (HOSPITAL_COMMUNITY)
Admission: RE | Admit: 2013-01-06 | Discharge: 2013-01-06 | Disposition: A | Discharge status: Home / Self Care | Payer: 59 | Source: Ambulatory Visit | Attending: Surgery | Admitting: Surgery

## 2013-01-06 ENCOUNTER — Encounter (HOSPITAL_COMMUNITY): Payer: 59 | Admitting: Vascular Surgery

## 2013-01-06 DIAGNOSIS — I10 Essential (primary) hypertension: Secondary | ICD-10-CM | POA: Insufficient documentation

## 2013-01-06 DIAGNOSIS — E669 Obesity, unspecified: Secondary | ICD-10-CM | POA: Insufficient documentation

## 2013-01-06 DIAGNOSIS — Z87891 Personal history of nicotine dependence: Secondary | ICD-10-CM | POA: Insufficient documentation

## 2013-01-06 DIAGNOSIS — D0590 Unspecified type of carcinoma in situ of unspecified breast: Secondary | ICD-10-CM | POA: Insufficient documentation

## 2013-01-06 DIAGNOSIS — E785 Hyperlipidemia, unspecified: Secondary | ICD-10-CM | POA: Insufficient documentation

## 2013-01-06 DIAGNOSIS — E119 Type 2 diabetes mellitus without complications: Secondary | ICD-10-CM | POA: Insufficient documentation

## 2013-01-06 DIAGNOSIS — E039 Hypothyroidism, unspecified: Secondary | ICD-10-CM | POA: Insufficient documentation

## 2013-01-06 DIAGNOSIS — Z85850 Personal history of malignant neoplasm of thyroid: Secondary | ICD-10-CM | POA: Insufficient documentation

## 2013-01-06 DIAGNOSIS — Z79899 Other long term (current) drug therapy: Secondary | ICD-10-CM | POA: Insufficient documentation

## 2013-01-06 DIAGNOSIS — D249 Benign neoplasm of unspecified breast: Secondary | ICD-10-CM | POA: Insufficient documentation

## 2013-01-06 DIAGNOSIS — Z7982 Long term (current) use of aspirin: Secondary | ICD-10-CM | POA: Insufficient documentation

## 2013-01-06 HISTORY — PX: BREAST LUMPECTOMY: SHX2

## 2013-01-06 LAB — GLUCOSE, CAPILLARY
Glucose-Capillary: 168 mg/dL — ABNORMAL HIGH (ref 70–99)
Glucose-Capillary: 116 mg/dL — ABNORMAL HIGH (ref 70–99)

## 2013-01-06 SURGERY — BREAST LUMPECTOMY
Anesthesia: General | Laterality: Left

## 2013-01-06 MED ORDER — BUPIVACAINE-EPINEPHRINE (PF) 0.25% -1:200000 IJ SOLN
INTRAMUSCULAR | Status: AC
  Filled 2013-01-06: qty 30

## 2013-01-06 MED ORDER — PHENYLEPHRINE HCL 10 MG/ML IJ SOLN
INTRAMUSCULAR | Status: DC | PRN
  Administered 2013-01-06 (×4): 80 ug via INTRAVENOUS

## 2013-01-06 MED ORDER — OXYCODONE HCL 5 MG PO TABS
ORAL_TABLET | ORAL | Status: AC
  Administered 2013-01-06: 10 mg via ORAL
  Filled 2013-01-06: qty 2

## 2013-01-06 MED ORDER — EPHEDRINE SULFATE 50 MG/ML IJ SOLN
INTRAMUSCULAR | Status: DC | PRN
  Administered 2013-01-06: 5 mg via INTRAVENOUS

## 2013-01-06 MED ORDER — OXYCODONE HCL 5 MG PO TABS
5.0000 mg | ORAL_TABLET | ORAL | Status: DC | PRN
  Administered 2013-01-06: 10 mg via ORAL

## 2013-01-06 MED ORDER — FENTANYL CITRATE 0.05 MG/ML IJ SOLN
INTRAMUSCULAR | Status: DC | PRN
  Administered 2013-01-06: 150 ug via INTRAVENOUS

## 2013-01-06 MED ORDER — LACTATED RINGERS IV SOLN
INTRAVENOUS | Status: DC | PRN
  Administered 2013-01-06 (×2): via INTRAVENOUS

## 2013-01-06 MED ORDER — ARTIFICIAL TEARS OP OINT
TOPICAL_OINTMENT | OPHTHALMIC | Status: DC | PRN
  Administered 2013-01-06: 1 via OPHTHALMIC

## 2013-01-06 MED ORDER — GLYCOPYRROLATE 0.2 MG/ML IJ SOLN
INTRAMUSCULAR | Status: DC | PRN
  Administered 2013-01-06: 0.4 mg via INTRAVENOUS

## 2013-01-06 MED ORDER — HYDROMORPHONE HCL PF 1 MG/ML IJ SOLN
INTRAMUSCULAR | Status: AC
  Administered 2013-01-06: 0.5 mg via INTRAVENOUS
  Filled 2013-01-06: qty 1

## 2013-01-06 MED ORDER — HYDROMORPHONE HCL PF 1 MG/ML IJ SOLN
0.2500 mg | INTRAMUSCULAR | Status: DC | PRN
  Administered 2013-01-06 (×3): 0.5 mg via INTRAVENOUS

## 2013-01-06 MED ORDER — OXYCODONE-ACETAMINOPHEN 5-325 MG PO TABS: 1.0000 | ORAL_TABLET | ORAL | Status: DC | PRN

## 2013-01-06 MED ORDER — 0.9 % SODIUM CHLORIDE (POUR BTL) OPTIME
TOPICAL | Status: DC | PRN
  Administered 2013-01-06: 1000 mL

## 2013-01-06 MED ORDER — BUPIVACAINE-EPINEPHRINE 0.25% -1:200000 IJ SOLN
INTRAMUSCULAR | Status: DC | PRN
  Administered 2013-01-06: 20 mL

## 2013-01-06 MED ORDER — LACTATED RINGERS IV SOLN
INTRAVENOUS | Status: DC
  Administered 2013-01-06: 07:00:00 via INTRAVENOUS

## 2013-01-06 MED ORDER — ONDANSETRON HCL 4 MG/2ML IJ SOLN
INTRAMUSCULAR | Status: DC | PRN
  Administered 2013-01-06: 4 mg via INTRAVENOUS

## 2013-01-06 MED ORDER — PROPOFOL 10 MG/ML IV BOLUS
INTRAVENOUS | Status: DC | PRN
  Administered 2013-01-06: 200 mg via INTRAVENOUS

## 2013-01-06 MED ORDER — LIDOCAINE HCL (CARDIAC) 20 MG/ML IV SOLN
INTRAVENOUS | Status: DC | PRN
  Administered 2013-01-06: 70 mg via INTRAVENOUS

## 2013-01-06 MED ORDER — ONDANSETRON HCL 4 MG/2ML IJ SOLN: 4.0000 mg | Freq: Once | INTRAMUSCULAR | Status: DC | PRN

## 2013-01-06 MED ORDER — MIDAZOLAM HCL 5 MG/5ML IJ SOLN
INTRAMUSCULAR | Status: DC | PRN
  Administered 2013-01-06: 2 mg via INTRAVENOUS

## 2013-01-06 SURGICAL SUPPLY — 39 items
BINDER BREAST LRG (GAUZE/BANDAGES/DRESSINGS) IMPLANT
BINDER BREAST XLRG (GAUZE/BANDAGES/DRESSINGS) IMPLANT
BLADE SURG 15 STRL LF DISP TIS (BLADE) ×1 IMPLANT
BLADE SURG 15 STRL SS (BLADE) ×1
CANISTER SUCTION 2500CC (MISCELLANEOUS) ×2 IMPLANT
CHLORAPREP W/TINT 26ML (MISCELLANEOUS) ×2 IMPLANT
CONT SPEC 4OZ CLIKSEAL STRL BL (MISCELLANEOUS) IMPLANT
COVER SURGICAL LIGHT HANDLE (MISCELLANEOUS) ×2 IMPLANT
DECANTER SPIKE VIAL GLASS SM (MISCELLANEOUS) ×2 IMPLANT
DERMABOND ADVANCED (GAUZE/BANDAGES/DRESSINGS) ×1
DERMABOND ADVANCED .7 DNX12 (GAUZE/BANDAGES/DRESSINGS) ×1 IMPLANT
DEVICE DUBIN SPECIMEN MAMMOGRA (MISCELLANEOUS) IMPLANT
DRAPE PED LAPAROTOMY (DRAPES) ×2 IMPLANT
ELECT CAUTERY BLADE 6.4 (BLADE) ×2 IMPLANT
ELECT REM PT RETURN 9FT ADLT (ELECTROSURGICAL) ×2
ELECTRODE REM PT RTRN 9FT ADLT (ELECTROSURGICAL) ×1 IMPLANT
GAUZE SPONGE 4X4 16PLY XRAY LF (GAUZE/BANDAGES/DRESSINGS) ×2 IMPLANT
GLOVE SURG SIGNA 7.5 PF LTX (GLOVE) ×2 IMPLANT
GOWN STRL NON-REIN LRG LVL3 (GOWN DISPOSABLE) ×2 IMPLANT
GOWN STRL REIN XL XLG (GOWN DISPOSABLE) ×2 IMPLANT
KIT BASIN OR (CUSTOM PROCEDURE TRAY) ×2 IMPLANT
KIT MARKER MARGIN INK (KITS) IMPLANT
KIT ROOM TURNOVER OR (KITS) ×2 IMPLANT
NEEDLE HYPO 25GX1X1/2 BEV (NEEDLE) ×2 IMPLANT
NS IRRIG 1000ML POUR BTL (IV SOLUTION) ×2 IMPLANT
PACK SURGICAL SETUP 50X90 (CUSTOM PROCEDURE TRAY) ×2 IMPLANT
PAD ARMBOARD 7.5X6 YLW CONV (MISCELLANEOUS) ×2 IMPLANT
PENCIL BUTTON HOLSTER BLD 10FT (ELECTRODE) ×2 IMPLANT
SPONGE GAUZE 4X4 12PLY (GAUZE/BANDAGES/DRESSINGS) ×2 IMPLANT
SUT MNCRL AB 4-0 PS2 18 (SUTURE) ×2 IMPLANT
SUT VIC AB 3-0 SH 27 (SUTURE) ×1
SUT VIC AB 3-0 SH 27X BRD (SUTURE) ×1 IMPLANT
SYR BULB 3OZ (MISCELLANEOUS) ×2 IMPLANT
SYR CONTROL 10ML LL (SYRINGE) ×2 IMPLANT
TAPE CLOTH SURG 6X10 WHT LF (GAUZE/BANDAGES/DRESSINGS) ×2 IMPLANT
TOWEL OR 17X24 6PK STRL BLUE (TOWEL DISPOSABLE) ×2 IMPLANT
TOWEL OR 17X26 10 PK STRL BLUE (TOWEL DISPOSABLE) ×2 IMPLANT
TUBE CONNECTING 12X1/4 (SUCTIONS) IMPLANT
YANKAUER SUCT BULB TIP NO VENT (SUCTIONS) IMPLANT

## 2013-01-06 NOTE — Anesthesia Preprocedure Evaluation (Signed)
Anesthesia Evaluation  Patient identified by MRN, date of birth, ID band Patient awake    Reviewed: Allergy & Precautions, H&P , NPO status , Patient's Chart, lab work & pertinent test results  Airway       Dental   Pulmonary former smoker,          Cardiovascular hypertension,     Neuro/Psych    GI/Hepatic   Endo/Other  diabetes, Type 2, Oral Hypoglycemic AgentsHypothyroidism   Renal/GU      Musculoskeletal   Abdominal   Peds  Hematology   Anesthesia Other Findings   Reproductive/Obstetrics                           Anesthesia Physical Anesthesia Plan  ASA: II  Anesthesia Plan: General   Post-op Pain Management:    Induction: Intravenous  Airway Management Planned: Oral ETT  Additional Equipment:   Intra-op Plan:   Post-operative Plan: Extubation in OR  Informed Consent: I have reviewed the patients History and Physical, chart, labs and discussed the procedure including the risks, benefits and alternatives for the proposed anesthesia with the patient or authorized representative who has indicated his/her understanding and acceptance.     Plan Discussed with:   Anesthesia Plan Comments:         Anesthesia Quick Evaluation

## 2013-01-06 NOTE — Transfer of Care (Signed)
Immediate Anesthesia Transfer of Care Note  Patient: Stacey Roberts  Procedure(s) Performed: Procedure(s): LUMPECTOMY (Left)  Patient Location: PACU  Anesthesia Type:General  Level of Consciousness: awake, alert  and oriented  Airway & Oxygen Therapy: Patient Spontanous Breathing and Patient connected to nasal cannula oxygen  Post-op Assessment: Report given to PACU RN, Post -op Vital signs reviewed and stable and Patient moving all extremities X 4  Post vital signs: Reviewed and stable  Complications: No apparent anesthesia complications

## 2013-01-06 NOTE — Op Note (Signed)
LUMPECTOMY  Procedure Note  Stacey Roberts 01/06/2013   Pre-op Diagnosis: left nipple discharge     Post-op Diagnosis: same  Procedure(s): LEFT BREAST LUMPECTOMY  Surgeon(s): Shelly Rubenstein, MD  Anesthesia: General  Staff:  Circulator: Arvil Persons, RN Relief Circulator: Lina Sayre, RN Scrub Person: Denese Killings, CST Circulator Assistant: Alpha Gula, RN  Estimated Blood Loss: Minimal               Specimens: sent to path          [facility] A   Date: 01/06/2013  Time: 11:09 AM

## 2013-01-06 NOTE — Anesthesia Procedure Notes (Signed)
Procedure Name: LMA Insertion Date/Time: 01/06/2013 10:29 AM Performed by: Lanell Matar Pre-anesthesia Checklist: Patient identified, Timeout performed, Emergency Drugs available, Suction available and Patient being monitored Patient Re-evaluated:Patient Re-evaluated prior to inductionOxygen Delivery Method: Circle system utilized Preoxygenation: Pre-oxygenation with 100% oxygen Intubation Type: IV induction Ventilation: Mask ventilation without difficulty LMA: LMA inserted LMA Size: 4.0 Number of attempts: 1 Placement Confirmation: CO2 detector,  positive ETCO2 and breath sounds checked- equal and bilateral Tube secured with: Tape Dental Injury: Teeth and Oropharynx as per pre-operative assessment

## 2013-01-06 NOTE — Anesthesia Postprocedure Evaluation (Signed)
Anesthesia Post Note  Patient: Stacey Roberts  Procedure(s) Performed: Procedure(s) (LRB): LUMPECTOMY (Left)  Anesthesia type: general  Patient location: PACU  Post pain: Pain level controlled  Post assessment: Patient's Cardiovascular Status Stable  Last Vitals:  Filed Vitals:   01/06/13 1230  BP: 132/75  Pulse: 95  Temp: 36.4 C  Resp: 15    Post vital signs: Reviewed and stable  Level of consciousness: sedated  Complications: No apparent anesthesia complications

## 2013-01-07 ENCOUNTER — Encounter (HOSPITAL_COMMUNITY): Payer: Self-pay | Admitting: Surgery

## 2013-01-07 NOTE — Anesthesia Postprocedure Evaluation (Signed)
  Anesthesia Post-op Note  Patient: Stacey Roberts  Procedure(s) Performed: Procedure(s): LUMPECTOMY (Left)  Patient Location: PACU  Anesthesia Type:General  Level of Consciousness: awake, alert , oriented and patient cooperative  Airway and Oxygen Therapy: Patient Spontanous Breathing  Post-op Pain: mild  Post-op Assessment: Post-op Vital signs reviewed, Patient's Cardiovascular Status Stable, Respiratory Function Stable, Patent Airway, No signs of Nausea or vomiting and Pain level controlled  Post-op Vital Signs: stable  Complications: No apparent anesthesia complications

## 2013-01-07 NOTE — Op Note (Signed)
[name], [name] NO.:  [id number]  MEDICAL RECORD NO.:  [id number]  LOCATION:  [facility]                         FACILITY:  [facility]  PHYSICIAN:  [name], M.D. DATE OF BIRTH:  04/03/1965  DATE OF PROCEDURE:  01/06/2013 DATE OF DISCHARGE:  01/06/2013                              OPERATIVE REPORT   PREOPERATIVE DIAGNOSIS:  Left breast bloody nipple discharge.  POSTOPERATIVE DIAGNOSIS:  Left breast bloody nipple discharge.  PROCEDURE:  Left breast lumpectomy.  SURGEON:  [name], M.D.  ANESTHESIA:  General and 0.5% Marcaine.  ESTIMATED BLOOD LOSS:  Minimal.  INDICATIONS:  This is a 47 year old female who has had spontaneous bloody nipple discharge from the left breast.  A ductogram was performed, which showed an intraductal lesion, which was approximately 6 cm into the central breast.  Decision was made to proceed with lumpectomy.  PROCEDURE IN DETAIL:  The patient was brought to the operating room, identified as Stacey Roberts.  She was placed supine on the operating table and general anesthesia was induced.  Her left breast was then prepped and draped in usual sterile fashion.  I anesthetized the skin at the lower edge of the areola with Marcaine and then made a circumareolar incision with a scalpel.  I took this down to the breast tissue with electrocautery.  I then performed a wide lumpectomy going underneath the areola down deep into the breast tissue.  Several duct holes still little blood were identified.  I then sent the large lumpectomy specimen to Pathology for evaluation.  I achieved hemostasis with cautery.  I irrigated the wound with saline.  I then closed subcutaneous tissue with interrupted 3-0 Vicryl sutures and closed the skin with running 4-0 Monocryl.  Dermabond, gauze, and tape were then applied.  The patient tolerated the procedure well.  All the counts were correct at the end of procedure.  The patient was then extubated  in the operating room and taken in a stable condition to recovery.     [name], M.D.     DB/MEDQ  D:  01/06/2013  T:  01/07/2013  Job:  [id number]

## 2013-01-08 ENCOUNTER — Encounter (INDEPENDENT_AMBULATORY_CARE_PROVIDER_SITE_OTHER): Payer: Self-pay | Admitting: General Surgery

## 2013-01-09 ENCOUNTER — Ambulatory Visit (INDEPENDENT_AMBULATORY_CARE_PROVIDER_SITE_OTHER): Payer: Self-pay | Admitting: General Surgery

## 2013-01-10 ENCOUNTER — Other Ambulatory Visit: Payer: Self-pay | Admitting: *Deleted

## 2013-01-10 ENCOUNTER — Ambulatory Visit
Admission: RE | Admit: 2013-01-10 | Discharge: 2013-01-10 | Disposition: A | Discharge status: Home / Self Care | Payer: 59 | Source: Ambulatory Visit | Attending: Surgery | Admitting: Surgery

## 2013-01-10 ENCOUNTER — Other Ambulatory Visit (INDEPENDENT_AMBULATORY_CARE_PROVIDER_SITE_OTHER): Payer: Self-pay | Admitting: Surgery

## 2013-01-10 ENCOUNTER — Other Ambulatory Visit: Payer: Self-pay | Admitting: Student

## 2013-01-10 DIAGNOSIS — Z853 Personal history of malignant neoplasm of breast: Secondary | ICD-10-CM

## 2013-01-10 DIAGNOSIS — D0512 Intraductal carcinoma in situ of left breast: Secondary | ICD-10-CM

## 2013-01-11 ENCOUNTER — Ambulatory Visit (HOSPITAL_COMMUNITY)
Admission: RE | Admit: 2013-01-11 | Discharge: 2013-01-11 | Disposition: A | Discharge status: Home / Self Care | Payer: 59 | Source: Ambulatory Visit | Attending: Surgery | Admitting: Surgery

## 2013-01-11 DIAGNOSIS — D0512 Intraductal carcinoma in situ of left breast: Secondary | ICD-10-CM

## 2013-01-13 ENCOUNTER — Other Ambulatory Visit: Payer: Self-pay | Admitting: Internal Medicine

## 2013-01-13 ENCOUNTER — Encounter: Payer: Self-pay | Admitting: Endocrinology

## 2013-01-13 DIAGNOSIS — Z9889 Other specified postprocedural states: Secondary | ICD-10-CM

## 2013-01-18 ENCOUNTER — Ambulatory Visit
Admission: RE | Admit: 2013-01-18 | Discharge: 2013-01-18 | Disposition: A | Discharge status: Home / Self Care | Payer: 59 | Source: Ambulatory Visit | Attending: Surgery | Admitting: Surgery

## 2013-01-18 ENCOUNTER — Other Ambulatory Visit (INDEPENDENT_AMBULATORY_CARE_PROVIDER_SITE_OTHER): Payer: Self-pay | Admitting: Surgery

## 2013-01-18 ENCOUNTER — Ambulatory Visit: Admission: RE | Admit: 2013-01-18 | Payer: 59 | Source: Ambulatory Visit

## 2013-01-18 DIAGNOSIS — C801 Malignant (primary) neoplasm, unspecified: Secondary | ICD-10-CM

## 2013-01-18 MED ORDER — GADOBENATE DIMEGLUMINE 529 MG/ML IV SOLN
20.0000 mL | Freq: Once | INTRAVENOUS | Status: AC | PRN
  Administered 2013-01-18: 20 mL via INTRAVENOUS

## 2013-01-20 ENCOUNTER — Other Ambulatory Visit (INDEPENDENT_AMBULATORY_CARE_PROVIDER_SITE_OTHER): Payer: Self-pay | Admitting: Surgery

## 2013-01-20 ENCOUNTER — Ambulatory Visit (INDEPENDENT_AMBULATORY_CARE_PROVIDER_SITE_OTHER): Payer: Commercial Managed Care - PPO | Admitting: Surgery

## 2013-01-20 ENCOUNTER — Encounter: Payer: Self-pay | Admitting: *Deleted

## 2013-01-20 ENCOUNTER — Encounter (INDEPENDENT_AMBULATORY_CARE_PROVIDER_SITE_OTHER): Payer: Self-pay | Admitting: Surgery

## 2013-01-20 VITALS — BP 129/88 | HR 88 | Temp 98.6°F | Resp 16 | Ht 64.0 in | Wt 256.4 lb

## 2013-01-20 DIAGNOSIS — C50912 Malignant neoplasm of unspecified site of left female breast: Secondary | ICD-10-CM

## 2013-01-20 DIAGNOSIS — Z09 Encounter for follow-up examination after completed treatment for conditions other than malignant neoplasm: Secondary | ICD-10-CM

## 2013-01-20 MED ORDER — OXYCODONE-ACETAMINOPHEN 5-325 MG PO TABS: 1.0000 | ORAL_TABLET | ORAL | Status: DC | PRN

## 2013-01-20 NOTE — Addendum Note (Signed)
Addended by: [name] A on: 01/20/2013 05:01 PM   Modules accepted: Orders

## 2013-01-20 NOTE — Progress Notes (Signed)
Received referral from Dr. Artist Pais for his pt to see Dr. Darnelle Catalan.  She is seeing Dr. Magnus Ivan (the surgeon) today and I sent an email to [facility] at [facility] to make her aware of this referral.  Sent paperwork to Dr. Darnelle Catalan for review and for an appt. Emailed Dr. Artist Pais to make him aware.

## 2013-01-20 NOTE — Progress Notes (Signed)
Subjective:     Patient ID: Stacey Roberts, female   DOB: 1965-06-03, 47 y.o.   MRN: [id number]  HPI She is here for a postop visit. She has minimal complaints of discomfort.  Review of Systems     Objective:   Physical Exam On exam, the incision is healing well. 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 The margin was positive. She has since had an MRI which shows no other disease in either breast    Assessment:     Patient stable postop     Plan:     We had already discussed the diagnosis by phone. I am recommending reexcision of the biopsy site. I discussed this with her in detail including the risks. We will also refer to the medical and radiation oncologist postoperatively. She is in agreement to proceed

## 2013-01-21 ENCOUNTER — Encounter (HOSPITAL_BASED_OUTPATIENT_CLINIC_OR_DEPARTMENT_OTHER): Payer: Self-pay | Admitting: *Deleted

## 2013-01-21 ENCOUNTER — Telehealth: Payer: Self-pay | Admitting: *Deleted

## 2013-01-21 NOTE — Telephone Encounter (Signed)
Called and confirmed 02/18/13 appt w/ pt.  Unable to schedule the lab appt - emailed Tiff M. To schedule for me.  Mailed before appt letter, welcome packet & intake form to pt.  Emailed [name] at [facility] to make her aware.  Took paperwork to Med [facility] for chart.

## 2013-01-21 NOTE — Progress Notes (Signed)
Does have OSA-does not use cpap-just had lumpectomy main-did fine and did not have to stay overnight-did tell her to bring all meds

## 2013-01-21 NOTE — H&P (Signed)
Stacey Roberts is an 47 y.o. female.   Chief Complaint: Ductal carcinoma in situ of the left breast HPI: This is a pleasant female who presented with bloody nipple discharge the left breast.  ductogram demonstrated an obstructing lesion in the ductule in the central left breast. Wide excision of this there is performed. 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 She has since had an MRI of both her breast which is otherwise unremarkable. She now presents for reexcision  Past Medical History  Diagnosis Date  . Diabetes mellitus, type II   . Obesity   . Hypertension   . Hyperlipidemia   . 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 Bronchitis     hx of  . Hypothyroidism   . Snoring     Past Surgical History  Procedure Laterality Date  . Tonsillectomy  1982  . Dilation and curettage of uterus  2004    s/p  . Thyroidectomy  02/2009    Stacey Roberts  . Tonsillectomy    . Breast biopsy  2000    s/p  . Breast lumpectomy Left 01/06/2013    Procedure: LUMPECTOMY;  Surgeon: Stacey Rubenstein, MD;  Location[facility];  Service: General;  Laterality: Left;    Family History  Problem Relation Age of Onset  . Dementia Father     deceased age 21 secondary to dementia  . Bipolar disorder Father   . Emphysema Father   . Heart disease Father   . CAD Mother 6    Died age 61 of CAD  . Heart disease Mother   . CAD Maternal Grandfather 96   Social History:  reports that she has quit smoking. Her smoking use included Cigarettes. She smoked 0.00 packs per day. She has never used smokeless tobacco. She reports that she does not drink alcohol or use illicit drugs.  Allergies:  Allergies  Allergen Reactions  . Penicillins Shortness Of Breath and Rash  . Ace Inhibitors     REACTION: cough    No prescriptions prior to admission    No results found for  this or any previous visit (from the past 48 hour(s)). Mr Breast Bilateral W Wo Contrast  01/20/2013   CLINICAL DATA:  Recently diagnosed low-grade ductal carcinoma in situ involving and intraductal papilloma on surgical excisional biopsy. Focally positive margin.  EXAM: BILATERAL BREAST MRI WITH AND WITHOUT CONTRAST  TECHNIQUE: Multiplanar, multisequence MR images of both breasts were obtained prior to and following the intravenous administration of 20ml of MultiHance  THREE-DIMENSIONAL MR IMAGE RENDERING ON INDEPENDENT WORKSTATION:  Three-dimensional MR images were rendered by post-processing of the original MR data on an independent workstation. The three-dimensional MR images were interpreted, and findings are reported in the following complete MRI report for this study. Three dimensional images were evaluated at the independent DynaCad workstation  COMPARISON:  Previous exams  FINDINGS: Breast composition: b. Scattered fibroglandular tissue  Background parenchymal enhancement: Minimal  Right breast: No mass or abnormal enhancement.  Left breast: There are postoperative changes present within the central/superior left breast with a seroma measuring 8.5 cm in greatest dimension present. There are no areas of worrisome enhancement within the left breast.  Lymph nodes: No abnormal appearing lymph nodes.  Ancillary findings:  None.  IMPRESSION: Postoperative changes within the left breast within a 8.5 cm seroma present. No findings worrisome for  malignancy.  RECOMMENDATION: Treatment plan.  BI-RADS CATEGORY  2: Benign Finding(s)   Electronically Signed   By: Stacey Roberts M.D.   On: 01/20/2013 10:33    Review of Systems  All other systems reviewed and are negative.    Last menstrual period 12/23/2012. Physical Exam  Constitutional: No distress.  obese  HENT:  Head: Normocephalic and atraumatic.  Eyes: Pupils are equal, round, and reactive to light.  Neck: Normal range of motion. Neck supple.   Cardiovascular: Normal rate, regular rhythm and normal heart sounds.   Respiratory: Effort normal and breath sounds normal.  Musculoskeletal: Normal range of motion.  Neurological: She is alert.  Skin: Skin is warm and dry.  Psychiatric: Her behavior is normal. Judgment normal.   Breasts: The incision on the left breast  Assessment/Plan Left breast DCIS with positive margins  The plan will be to proceed to the operating room for reexcision Of the left breast lumpectomy site to achieve negative margins. Again, she understands the risks which includes but is not limited to bleeding, infection, need for further surgery should more DCIS be found, seroma formation, etc. She understands and wishes to proceed  Stacey Roberts A 01/21/2013, 8:31 AM

## 2013-01-22 ENCOUNTER — Ambulatory Visit (HOSPITAL_BASED_OUTPATIENT_CLINIC_OR_DEPARTMENT_OTHER): Payer: 59 | Admitting: *Deleted

## 2013-01-22 ENCOUNTER — Encounter (HOSPITAL_BASED_OUTPATIENT_CLINIC_OR_DEPARTMENT_OTHER): Payer: 59 | Admitting: *Deleted

## 2013-01-22 ENCOUNTER — Encounter (HOSPITAL_BASED_OUTPATIENT_CLINIC_OR_DEPARTMENT_OTHER): Payer: Self-pay | Admitting: *Deleted

## 2013-01-22 ENCOUNTER — Ambulatory Visit (HOSPITAL_BASED_OUTPATIENT_CLINIC_OR_DEPARTMENT_OTHER)
Admission: RE | Admit: 2013-01-22 | Discharge: 2013-01-22 | Disposition: A | Discharge status: Home / Self Care | Payer: 59 | Source: Ambulatory Visit | Attending: Surgery | Admitting: Surgery

## 2013-01-22 ENCOUNTER — Encounter (HOSPITAL_BASED_OUTPATIENT_CLINIC_OR_DEPARTMENT_OTHER)
Admission: RE | Disposition: A | Discharge status: Home / Self Care | Payer: Self-pay | Source: Ambulatory Visit | Attending: Surgery

## 2013-01-22 DIAGNOSIS — Z87891 Personal history of nicotine dependence: Secondary | ICD-10-CM | POA: Insufficient documentation

## 2013-01-22 DIAGNOSIS — E119 Type 2 diabetes mellitus without complications: Secondary | ICD-10-CM | POA: Insufficient documentation

## 2013-01-22 DIAGNOSIS — D0590 Unspecified type of carcinoma in situ of unspecified breast: Secondary | ICD-10-CM

## 2013-01-22 DIAGNOSIS — E785 Hyperlipidemia, unspecified: Secondary | ICD-10-CM | POA: Insufficient documentation

## 2013-01-22 DIAGNOSIS — Z880 Allergy status to penicillin: Secondary | ICD-10-CM | POA: Insufficient documentation

## 2013-01-22 DIAGNOSIS — I10 Essential (primary) hypertension: Secondary | ICD-10-CM | POA: Insufficient documentation

## 2013-01-22 DIAGNOSIS — E669 Obesity, unspecified: Secondary | ICD-10-CM | POA: Insufficient documentation

## 2013-01-22 HISTORY — PX: BREAST BIOPSY: SHX20

## 2013-01-22 HISTORY — DX: Sleep apnea, unspecified: G47.30

## 2013-01-22 HISTORY — DX: Gastro-esophageal reflux disease without esophagitis: K21.9

## 2013-01-22 LAB — GLUCOSE, CAPILLARY: Glucose-Capillary: 124 mg/dL — ABNORMAL HIGH (ref 70–99)

## 2013-01-22 SURGERY — BREAST BIOPSY WITH NEEDLE LOCALIZATION
Anesthesia: General | Laterality: Left

## 2013-01-22 MED ORDER — ACETAMINOPHEN 650 MG RE SUPP: 650.0000 mg | RECTAL | Status: DC | PRN

## 2013-01-22 MED ORDER — SODIUM CHLORIDE 0.9 % IJ SOLN: 3.0000 mL | INTRAMUSCULAR | Status: DC | PRN

## 2013-01-22 MED ORDER — DEXAMETHASONE SODIUM PHOSPHATE 4 MG/ML IJ SOLN
INTRAMUSCULAR | Status: DC | PRN
  Administered 2013-01-22: 4 mg via INTRAVENOUS

## 2013-01-22 MED ORDER — BUPIVACAINE-EPINEPHRINE PF 0.5-1:200000 % IJ SOLN
INTRAMUSCULAR | Status: AC
  Filled 2013-01-22: qty 30

## 2013-01-22 MED ORDER — FENTANYL CITRATE 0.05 MG/ML IJ SOLN
INTRAMUSCULAR | Status: AC
  Filled 2013-01-22: qty 4

## 2013-01-22 MED ORDER — ACETAMINOPHEN 325 MG PO TABS: 650.0000 mg | ORAL_TABLET | ORAL | Status: DC | PRN

## 2013-01-22 MED ORDER — ONDANSETRON HCL 4 MG/2ML IJ SOLN
INTRAMUSCULAR | Status: DC | PRN
  Administered 2013-01-22: 4 mg via INTRAVENOUS

## 2013-01-22 MED ORDER — PROPOFOL 10 MG/ML IV BOLUS
INTRAVENOUS | Status: DC | PRN
  Administered 2013-01-22: 260 mg via INTRAVENOUS

## 2013-01-22 MED ORDER — FENTANYL CITRATE 0.05 MG/ML IJ SOLN
INTRAMUSCULAR | Status: DC | PRN
  Administered 2013-01-22: 100 ug via INTRAVENOUS

## 2013-01-22 MED ORDER — HYDROMORPHONE HCL PF 1 MG/ML IJ SOLN
INTRAMUSCULAR | Status: AC
  Filled 2013-01-22: qty 1

## 2013-01-22 MED ORDER — OXYCODONE HCL 5 MG PO TABS: 5.0000 mg | ORAL_TABLET | ORAL | Status: DC | PRN

## 2013-01-22 MED ORDER — CIPROFLOXACIN IN D5W 400 MG/200ML IV SOLN
400.0000 mg | INTRAVENOUS | Status: AC
  Administered 2013-01-22: 400 mg via INTRAVENOUS

## 2013-01-22 MED ORDER — HYDROMORPHONE HCL PF 1 MG/ML IJ SOLN
0.2500 mg | INTRAMUSCULAR | Status: DC | PRN
  Administered 2013-01-22 (×2): 0.5 mg via INTRAVENOUS

## 2013-01-22 MED ORDER — ONDANSETRON HCL 4 MG/2ML IJ SOLN: 4.0000 mg | Freq: Four times a day (QID) | INTRAMUSCULAR | Status: DC | PRN

## 2013-01-22 MED ORDER — LIDOCAINE HCL (CARDIAC) 20 MG/ML IV SOLN
INTRAVENOUS | Status: DC | PRN
  Administered 2013-01-22: 60 mg via INTRAVENOUS

## 2013-01-22 MED ORDER — OXYCODONE HCL 5 MG PO TABS
ORAL_TABLET | ORAL | Status: AC
  Filled 2013-01-22: qty 1

## 2013-01-22 MED ORDER — MIDAZOLAM HCL 2 MG/2ML IJ SOLN: 1.0000 mg | INTRAMUSCULAR | Status: DC | PRN

## 2013-01-22 MED ORDER — CIPROFLOXACIN IN D5W 400 MG/200ML IV SOLN
INTRAVENOUS | Status: AC
  Filled 2013-01-22: qty 200

## 2013-01-22 MED ORDER — LACTATED RINGERS IV SOLN
INTRAVENOUS | Status: DC
  Administered 2013-01-22: 11:00:00 via INTRAVENOUS

## 2013-01-22 MED ORDER — MORPHINE SULFATE 4 MG/ML IJ SOLN: 4.0000 mg | INTRAMUSCULAR | Status: DC | PRN

## 2013-01-22 MED ORDER — PROPOFOL 10 MG/ML IV EMUL
INTRAVENOUS | Status: AC
  Filled 2013-01-22: qty 50

## 2013-01-22 MED ORDER — SODIUM CHLORIDE 0.9 % IJ SOLN: 3.0000 mL | Freq: Two times a day (BID) | INTRAMUSCULAR | Status: DC

## 2013-01-22 MED ORDER — BUPIVACAINE-EPINEPHRINE 0.5% -1:200000 IJ SOLN
INTRAMUSCULAR | Status: DC | PRN
  Administered 2013-01-22: 20 mL

## 2013-01-22 MED ORDER — OXYCODONE HCL 5 MG/5ML PO SOLN: 5.0000 mg | Freq: Once | ORAL | Status: AC | PRN

## 2013-01-22 MED ORDER — FENTANYL CITRATE 0.05 MG/ML IJ SOLN: 50.0000 ug | INTRAMUSCULAR | Status: DC | PRN

## 2013-01-22 MED ORDER — MIDAZOLAM HCL 5 MG/5ML IJ SOLN
INTRAMUSCULAR | Status: DC | PRN
  Administered 2013-01-22: 2 mg via INTRAVENOUS

## 2013-01-22 MED ORDER — MIDAZOLAM HCL 2 MG/2ML IJ SOLN
INTRAMUSCULAR | Status: AC
  Filled 2013-01-22: qty 2

## 2013-01-22 MED ORDER — ONDANSETRON HCL 4 MG/2ML IJ SOLN: 4.0000 mg | Freq: Once | INTRAMUSCULAR | Status: DC | PRN

## 2013-01-22 MED ORDER — LIDOCAINE HCL (PF) 1 % IJ SOLN
INTRAMUSCULAR | Status: AC
  Filled 2013-01-22: qty 30

## 2013-01-22 MED ORDER — SODIUM CHLORIDE 0.9 % IV SOLN: 250.0000 mL | INTRAVENOUS | Status: DC | PRN

## 2013-01-22 MED ORDER — OXYCODONE HCL 5 MG PO TABS
5.0000 mg | ORAL_TABLET | Freq: Once | ORAL | Status: AC | PRN
Start: 2013-01-22 — End: 2013-01-22
  Administered 2013-01-22: 5 mg via ORAL

## 2013-01-22 SURGICAL SUPPLY — 42 items
APPLIER CLIP 9.375 MED OPEN (MISCELLANEOUS) ×2
BENZOIN TINCTURE PRP APPL 2/3 (GAUZE/BANDAGES/DRESSINGS) ×2 IMPLANT
BLADE HEX COATED 2.75 (ELECTRODE) ×2 IMPLANT
BLADE SURG 15 STRL LF DISP TIS (BLADE) ×1 IMPLANT
BLADE SURG 15 STRL SS (BLADE) ×1
CANISTER SUCT 1200ML W/VALVE (MISCELLANEOUS) ×2 IMPLANT
CHLORAPREP W/TINT 26ML (MISCELLANEOUS) ×2 IMPLANT
CLIP APPLIE 9.375 MED OPEN (MISCELLANEOUS) ×1 IMPLANT
CLIP TI WIDE RED SMALL 6 (CLIP) IMPLANT
COVER MAYO STAND STRL (DRAPES) ×2 IMPLANT
COVER TABLE BACK 60X90 (DRAPES) ×2 IMPLANT
DECANTER SPIKE VIAL GLASS SM (MISCELLANEOUS) IMPLANT
DEVICE DUBIN W/COMP PLATE 8390 (MISCELLANEOUS) IMPLANT
DRAPE PED LAPAROTOMY (DRAPES) ×2 IMPLANT
DRAPE UTILITY XL STRL (DRAPES) ×2 IMPLANT
DRSG TEGADERM 4X4.75 (GAUZE/BANDAGES/DRESSINGS) ×2 IMPLANT
ELECT REM PT RETURN 9FT ADLT (ELECTROSURGICAL) ×2
ELECTRODE REM PT RTRN 9FT ADLT (ELECTROSURGICAL) ×1 IMPLANT
GLOVE BIOGEL M 7.0 STRL (GLOVE) ×2 IMPLANT
GLOVE BIOGEL PI IND STRL 7.5 (GLOVE) ×1 IMPLANT
GLOVE BIOGEL PI INDICATOR 7.5 (GLOVE) ×1
GLOVE SURG SIGNA 7.5 PF LTX (GLOVE) ×2 IMPLANT
GOWN PREVENTION PLUS XLARGE (GOWN DISPOSABLE) ×2 IMPLANT
GOWN PREVENTION PLUS XXLARGE (GOWN DISPOSABLE) ×2 IMPLANT
KIT MARKER MARGIN INK (KITS) IMPLANT
NEEDLE HYPO 25X1 1.5 SAFETY (NEEDLE) ×2 IMPLANT
NS IRRIG 1000ML POUR BTL (IV SOLUTION) ×2 IMPLANT
PACK BASIN DAY SURGERY FS (CUSTOM PROCEDURE TRAY) ×2 IMPLANT
PENCIL BUTTON HOLSTER BLD 10FT (ELECTRODE) ×2 IMPLANT
SLEEVE SCD COMPRESS KNEE MED (MISCELLANEOUS) ×2 IMPLANT
SPONGE GAUZE 4X4 12PLY STER LF (GAUZE/BANDAGES/DRESSINGS) ×2 IMPLANT
SPONGE LAP 4X18 X RAY DECT (DISPOSABLE) ×4 IMPLANT
STRIP CLOSURE SKIN 1/2X4 (GAUZE/BANDAGES/DRESSINGS) ×2 IMPLANT
SUT MNCRL AB 4-0 PS2 18 (SUTURE) ×4 IMPLANT
SUT SILK 2 0 SH (SUTURE) ×2 IMPLANT
SUT VIC AB 3-0 SH 27 (SUTURE) ×1
SUT VIC AB 3-0 SH 27X BRD (SUTURE) ×1 IMPLANT
SYR CONTROL 10ML LL (SYRINGE) ×2 IMPLANT
TOWEL OR 17X24 6PK STRL BLUE (TOWEL DISPOSABLE) ×2 IMPLANT
TOWEL OR NON WOVEN STRL DISP B (DISPOSABLE) ×2 IMPLANT
TUBE CONNECTING 20X1/4 (TUBING) ×2 IMPLANT
YANKAUER SUCT BULB TIP NO VENT (SUCTIONS) ×2 IMPLANT

## 2013-01-22 NOTE — Anesthesia Postprocedure Evaluation (Signed)
  Anesthesia Post-op Note  Patient: Stacey Roberts  Procedure(s) Performed: Procedure(s): RE-EXCISION LEFT BREAST DCIS (Left)  Patient Location: PACU  Anesthesia Type:General  Level of Consciousness: awake, alert  and oriented  Airway and Oxygen Therapy: Patient Spontanous Breathing  Post-op Pain: mild  Post-op Assessment: Post-op Vital signs reviewed  Post-op Vital Signs: Reviewed  Complications: No apparent anesthesia complications

## 2013-01-22 NOTE — Op Note (Signed)
RE-EXCISION LEFT BREAST DCIS  Procedure Note  Stacey Roberts 01/22/2013   Pre-op Diagnosis: postive margins left breast dcis     Post-op Diagnosis: same  Procedure(s): RE-EXCISION LEFT BREAST DCIS  Surgeon(s): Shelly Rubenstein, MD  Anesthesia: General  Staff:  Circulator: Chrystine Oiler, RN Scrub Person: Vernie Ammons Bouchillon, RN  Estimated Blood Loss: Minimal               Specimens: sent to path          [facility] A   Date: 01/22/2013  Time: 11:24 AM

## 2013-01-22 NOTE — Anesthesia Preprocedure Evaluation (Signed)
Anesthesia Evaluation  Patient identified by MRN, date of birth, ID band Patient awake    Reviewed: Allergy & Precautions, H&P , NPO status , Patient's Chart, lab work & pertinent test results  Airway Mallampati: I TM Distance: >3 FB     Dental  (+) Teeth Intact and Dental Advisory Given   Pulmonary former smoker,  breath sounds clear to auscultation        Cardiovascular hypertension, Pt. on medications Rhythm:Regular     Neuro/Psych    GI/Hepatic   Endo/Other  diabetes, Well Controlled, Type 2, Oral Hypoglycemic AgentsMorbid obesity  Renal/GU      Musculoskeletal   Abdominal   Peds  Hematology   Anesthesia Other Findings   Reproductive/Obstetrics                           Anesthesia Physical Anesthesia Plan  ASA: III  Anesthesia Plan: General   Post-op Pain Management:    Induction: Intravenous  Airway Management Planned: LMA  Additional Equipment:   Intra-op Plan:   Post-operative Plan: Extubation in OR  Informed Consent: I have reviewed the patients History and Physical, chart, labs and discussed the procedure including the risks, benefits and alternatives for the proposed anesthesia with the patient or authorized representative who has indicated his/her understanding and acceptance.   Dental advisory given  Plan Discussed with: CRNA, Anesthesiologist and Surgeon  Anesthesia Plan Comments:         Anesthesia Quick Evaluation

## 2013-01-22 NOTE — Transfer of Care (Signed)
Immediate Anesthesia Transfer of Care Note  Patient: Stacey Roberts  Procedure(s) Performed: Procedure(s): RE-EXCISION LEFT BREAST DCIS (Left)  Patient Location: PACU  Anesthesia Type:General  Level of Consciousness: awake, alert  and oriented  Airway & Oxygen Therapy: Patient Spontanous Breathing and Patient connected to face mask oxygen  Post-op Assessment: Report given to PACU RN, Post -op Vital signs reviewed and stable and Patient moving all extremities  Post vital signs: Reviewed and stable  Complications: No apparent anesthesia complications

## 2013-01-22 NOTE — Anesthesia Procedure Notes (Signed)
Procedure Name: LMA Insertion Date/Time: 01/22/2013 10:47 AM Performed by: [name] A Pre-anesthesia Checklist: Patient identified, Emergency Drugs available, Suction available and Patient being monitored Patient Re-evaluated:Patient Re-evaluated prior to inductionOxygen Delivery Method: Circle System Utilized Preoxygenation: Pre-oxygenation with 100% oxygen Intubation Type: IV induction Ventilation: Mask ventilation without difficulty LMA: LMA inserted LMA Size: 4.0 Number of attempts: 1 Airway Equipment and Method: bite block Placement Confirmation: positive ETCO2 and breath sounds checked- equal and bilateral Tube secured with: Tape Dental Injury: Teeth and Oropharynx as per pre-operative assessment

## 2013-01-22 NOTE — Interval H&P Note (Signed)
History and Physical Interval Note: no change in H and P  01/22/2013 9:50 AM  Stacey Roberts  has presented today for surgery, with the diagnosis of postive margins left breast dcis  The various methods of treatment have been discussed with the patient and family. After consideration of risks, benefits and other options for treatment, the patient has consented to  Procedure(s): RE-EXCISION LEFT BREAST DCIS (Left) as a surgical intervention .  The patient's history has been reviewed, patient examined, no change in status, stable for surgery.  I have reviewed the patient's chart and labs.  Questions were answered to the patient's satisfaction.     Islam Eichinger A

## 2013-01-23 NOTE — Op Note (Signed)
NAMEGEORGEANA, OERTEL NO.:  [id number]  MEDICAL RECORD NO.:  [id number]  LOCATION:                               FACILITY:  [facility]  PHYSICIAN:  Coralie Keens, M.D. 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  PROCEDURE:  Re-excision of left breast ductal carcinoma in situ.  SURGEON:  Coralie Keens, M.D.  ANESTHESIA:  General and 0.5% Marcaine with epinephrine.  ESTIMATED BLOOD LOSS:  Minimal.  INDICATIONS:  This is a 48 year old female who underwent a left breast lumpectomy for a papilloma.  Incidentally, there was found to be ductal carcinoma in situ with positive margins.  She has since had an MRI of both her breasts, which show no other abnormalities except some slight enhancement at the 9 o'clock position posteriorly of the biopsy cavity. Decision has been made to proceed with re-excision.  FINDINGS:  I performed a small biopsy of the posterior aspect of the nipple and sent this to Pathology for evaluation.  I then excised the remaining biopsy cavity at the posterior margin wisely.  PROCEDURE IN DETAIL:  The patient was brought to the operating room, identified as Stacey Roberts.  She was placed supine on the operating table and general anesthesia was induced.  Her left breast was then prepped and draped in usual sterile fashion.  I anesthetized the skin with 0.5% Marcaine with epinephrine and then ellipsed the previous scar at the 12 o'clock position of the areola.  I then removed the skin in its entirety.  I then entered the biopsy cavity and aspirated the seroma.  At this point, since I had just been underneath the areola nipple, I shaved tissue at the nipple itself as a nipple  margin.  I then completely excised the posterior margin in full for a large area that included also the medial and lateral as well as inferior margins.  This was sent to Pathology for evaluation.  I then irrigated the wound with saline.  Hemostasis was achieved with cautery.  I anesthetized it further with the Marcaine.  I then placed surgical clips in the biopsy cavity.  I then closed the subcutaneous tissue with interrupted 3- 0 Vicryl sutures and closed the skin with running 4-0 Monocryl.  Steri- Strips, gauze, and Tegaderm were then applied.  The patient tolerated the procedure well.  All other counts were correct at the end of the procedure.  The patient was then extubated in the operating room and taken in a stable condition to the recovery room.     Coralie Keens, M.D.     DB/MEDQ  D:  01/22/2013  T:  01/23/2013  Job:  [id number]

## 2013-01-24 ENCOUNTER — Encounter (HOSPITAL_BASED_OUTPATIENT_CLINIC_OR_DEPARTMENT_OTHER): Payer: Self-pay | Admitting: Surgery

## 2013-01-27 ENCOUNTER — Telehealth (INDEPENDENT_AMBULATORY_CARE_PROVIDER_SITE_OTHER): Payer: Self-pay | Admitting: General Surgery

## 2013-01-27 ENCOUNTER — Telehealth (INDEPENDENT_AMBULATORY_CARE_PROVIDER_SITE_OTHER): Payer: Self-pay

## 2013-01-27 NOTE — Telephone Encounter (Signed)
Patient is needing a post op appt with DR. Ninfa Linden sx 01-22-13 left breast . Please advise nothing available on 12th

## 2013-01-27 NOTE — Telephone Encounter (Signed)
LMOM for patient to call back and ask for [facility]. Patient needs a apt with Dr Ninfa Linden

## 2013-02-03 ENCOUNTER — Encounter (INDEPENDENT_AMBULATORY_CARE_PROVIDER_SITE_OTHER): Payer: Self-pay | Admitting: Surgery

## 2013-02-03 ENCOUNTER — Ambulatory Visit (INDEPENDENT_AMBULATORY_CARE_PROVIDER_SITE_OTHER): Payer: Commercial Managed Care - PPO | Admitting: Surgery

## 2013-02-03 VITALS — BP 128/80 | HR 68 | Temp 97.4°F | Resp 14 | Ht 64.0 in | Wt 255.8 lb

## 2013-02-03 DIAGNOSIS — Z09 Encounter for follow-up examination after completed treatment for conditions other than malignant neoplasm: Secondary | ICD-10-CM

## 2013-02-03 MED ORDER — OXYCODONE-ACETAMINOPHEN 5-325 MG PO TABS: 1.0000 | ORAL_TABLET | ORAL | Status: DC | PRN

## 2013-02-03 NOTE — Progress Notes (Signed)
Subjective:     Patient ID: Stacey Roberts, female   DOB: [birth year], 48 y.o.   MRN: [id number]  HPI She is here for her postoperative status post reexcision of the margin of the left breast. She has had a little bit of fluid drainage but is otherwise doing well  Review of Systems     Objective:   Physical Exam On exam, the incision is healing well except for a small open area which I closed with Dermabond. There does not appear to be a large seroma. The final pathology did show some residual DCIS but the margins are now negative    Assessment:     Low-grade DCIS of the left breast     Plan:     She will be seeing the oncologists in 2 weeks. I will see her back in 3 weeks.

## 2013-02-04 ENCOUNTER — Other Ambulatory Visit: Payer: Self-pay | Admitting: *Deleted

## 2013-02-05 ENCOUNTER — Other Ambulatory Visit: Payer: Self-pay | Admitting: *Deleted

## 2013-02-05 DIAGNOSIS — C50119 Malignant neoplasm of central portion of unspecified female breast: Secondary | ICD-10-CM

## 2013-02-05 DIAGNOSIS — Z170 Estrogen receptor positive status [ER+]: Secondary | ICD-10-CM

## 2013-02-05 DIAGNOSIS — Z853 Personal history of malignant neoplasm of breast: Secondary | ICD-10-CM | POA: Insufficient documentation

## 2013-02-05 DIAGNOSIS — C50112 Malignant neoplasm of central portion of left female breast: Secondary | ICD-10-CM | POA: Insufficient documentation

## 2013-02-07 ENCOUNTER — Encounter: Payer: Self-pay | Admitting: *Deleted

## 2013-02-07 NOTE — Progress Notes (Signed)
Completed chart and gave to [facility] to enter the labs and give chart back to me.

## 2013-02-07 NOTE — Progress Notes (Signed)
Received chart back and placed in Dr. Virgie Dad box.

## 2013-02-15 ENCOUNTER — Ambulatory Visit: Payer: Self-pay | Admitting: Physician Assistant

## 2013-02-18 ENCOUNTER — Ambulatory Visit (HOSPITAL_BASED_OUTPATIENT_CLINIC_OR_DEPARTMENT_OTHER): Payer: 59 | Admitting: Oncology

## 2013-02-18 ENCOUNTER — Other Ambulatory Visit (HOSPITAL_BASED_OUTPATIENT_CLINIC_OR_DEPARTMENT_OTHER): Payer: 59

## 2013-02-18 ENCOUNTER — Ambulatory Visit: Payer: 59

## 2013-02-18 VITALS — BP 125/83 | HR 92 | Temp 97.7°F | Resp 18 | Ht 64.0 in | Wt 253.8 lb

## 2013-02-18 DIAGNOSIS — C73 Malignant neoplasm of thyroid gland: Secondary | ICD-10-CM

## 2013-02-18 DIAGNOSIS — D0590 Unspecified type of carcinoma in situ of unspecified breast: Secondary | ICD-10-CM

## 2013-02-18 DIAGNOSIS — C50119 Malignant neoplasm of central portion of unspecified female breast: Secondary | ICD-10-CM

## 2013-02-18 DIAGNOSIS — Z170 Estrogen receptor positive status [ER+]: Secondary | ICD-10-CM

## 2013-02-18 LAB — CBC WITH DIFFERENTIAL/PLATELET
BASO%: 0.4 % (ref 0.0–2.0)
BASOS ABS: 0 10*3/uL (ref 0.0–0.1)
EOS ABS: 0.2 10*3/uL (ref 0.0–0.5)
EOS%: 1.6 % (ref 0.0–7.0)
HCT: 39.6 % (ref 34.8–46.6)
HEMOGLOBIN: 12.5 g/dL (ref 11.6–15.9)
LYMPH%: 20.9 % (ref 14.0–49.7)
MCH: 26.1 pg (ref 25.1–34.0)
MCHC: 31.7 g/dL (ref 31.5–36.0)
MCV: 82.6 fL (ref 79.5–101.0)
MONO#: 0.6 10*3/uL (ref 0.1–0.9)
MONO%: 5.9 % (ref 0.0–14.0)
NEUT%: 71.2 % (ref 38.4–76.8)
NEUTROS ABS: 6.7 10*3/uL — AB (ref 1.5–6.5)
Platelets: 306 10*3/uL (ref 145–400)
RBC: 4.79 10*6/uL (ref 3.70–5.45)
RDW: 16.4 % — AB (ref 11.2–14.5)
WBC: 9.4 10*3/uL (ref 3.9–10.3)
lymph#: 2 10*3/uL (ref 0.9–3.3)

## 2013-02-18 LAB — COMPREHENSIVE METABOLIC PANEL (CC13)
ALT: 16 U/L (ref 0–55)
AST: 10 U/L (ref 5–34)
Albumin: 3.6 g/dL (ref 3.5–5.0)
Alkaline Phosphatase: 85 U/L (ref 40–150)
Anion Gap: 10 mEq/L (ref 3–11)
BUN: 14.2 mg/dL (ref 7.0–26.0)
CO2: 25 meq/L (ref 22–29)
CREATININE: 0.8 mg/dL (ref 0.6–1.1)
Calcium: 9.2 mg/dL (ref 8.4–10.4)
Chloride: 102 mEq/L (ref 98–109)
Glucose: 300 mg/dl — ABNORMAL HIGH (ref 70–140)
POTASSIUM: 4.4 meq/L (ref 3.5–5.1)
SODIUM: 137 meq/L (ref 136–145)
Total Bilirubin: 0.33 mg/dL (ref 0.20–1.20)
Total Protein: 7.2 g/dL (ref 6.4–8.3)

## 2013-02-18 NOTE — Progress Notes (Signed)
[place]  [phone] [phone]     ID: Stacey Roberts OB: 12-Dec-1965  MR#: [id number]  [id number]  PCP: Drema Pry, DO GYN:  Delila Pereyra SU: Coralie Keens OTHER MD: Minus Breeding, Lance Morin, Renato Shin  CHIEF COMPLAINT:  HISTORY OF PRESENT ILLNESS: The patient had routine mammographic screening at the breast Center at 01/29/2012, which was negative. However in November 20 15th she developed a bloody left nipple discharge which she brought to Dr Roque Cash attention. He set her up for a left diagnostic mammogram and ultrasound at the breast Center 12-2012. The mammogram showed no suspicious mass, calcifications, or distortion. There was no palpable mass in the left breast by Dr.  Clarise Cruz exam, though a bloody nipple discharge was expressed from a single duct. Ultrasound showed normal tissue in the subareolar area, and no intraductal mass was identified.  On the same day a left unilateral ductogram was performed this showed an intraductal mass with abrupt cutoff of a duct in the central aspect of the breast, approximately 6 cm from the nipple. This was felt to be concerning for possible papilloma, and on 01/06/2013 the patient underwent left lumpectomy, with the final pathology (SZA [id number]) showing at a ductal carcinoma in situ, low-grade, measuring at least 1.5 cm. The tumor was estrogen receptor 99% positive and progesterone receptor 100% positive, both with strong staining intensity. An inked surgical margin was focally positive  On 01/10/2013 the patient had right diagnostic mammography which showed again no suspicious masses or calcifications. Breast MRI 01/18/2013 showed post lumpectomy changes including a cavity measuring 8.6 cm. In the posterior medial aspect of this cavity there was a nodule of enhancement measuring 0.8 cm. This was suspicious for residual disease and MRI guided tissue marker placement was suggested. Bilateral breast  MRI on 01/20/2013 however showed no areas of worrisome enhancement in the left breast.  On 01/22/2013 the patient underwent additional surgery of the left breast for margin clearance. The left posterior margin was negative, focally 0.1 cm from the inked surface.  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 The tumor measured 1.7 cm, was confined within the thyroid parenchyma, but there was invasion of the capsule and there was angiolymphatic invasion noted (pT1b NX). She received it radioactive iodine therapy 05/06/2009 with a post therapy scan a week later showing focal uptake in the thyroid bed only, consistent with residual thyroid tissue. Repeat scan 07/15/2010 was negative  The patient's subsequent history is as detailed below.  INTERVAL HISTORY: Stacey Roberts was evaluated in the breast clinic on 02/18/2013 accompanied by her husband Stacey Roberts  REVIEW OF SYSTEMS: Aside from the left breast bloody nipple discharge, there were no symptoms suggestive of breast cancer. The patient denies unusual headaches, visual changes, nausea, vomiting, stiff neck, dizziness, or gait imbalance. There has been no cough, phlegm production, or pleurisy, no chest pain or pressure, and no change in bowel or bladder habits. The patient denies fever, rash, bleeding, unexplained fatigue or unexplained weight loss. She tells me her blood sugar is not as well controlled as she would like. Currently she is not exercising regularly. She occasionally sees a little bit of bright red blood in the tissue, but is not constipated or have had any change in bowel habits. A detailed review of systems was otherwise entirely negative.  PAST MEDICAL HISTORY: Past Medical History  Diagnosis Date  . Diabetes mellitus, type II   . Obesity   . Hypertension   .  Hyperlipidemia   . 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 Buddy Duty  . Bronchitis     hx of  . Hypothyroidism   . Snoring   . Sleep apnea 2008    severe OSA-does not use a cpap  . GERD (gastroesophageal reflux disease)     PAST SURGICAL HISTORY: Past Surgical History  Procedure Laterality Date  . Tonsillectomy  1982  . Dilation and curettage of uterus  2004    s/p  . Thyroidectomy  02/2009    Dr Harlow Asa  . Tonsillectomy    . Breast biopsy  2000    s/p  . Breast lumpectomy Left 01/06/2013    Procedure: LUMPECTOMY;  Surgeon: Harl Bowie, MD;  Location: [facility];  Service: General;  Laterality: Left;  . Breast biopsy Left 01/22/2013    Procedure: RE-EXCISION LEFT BREAST DCIS;  Surgeon: Harl Bowie, MD;  Location: [facility];  Service: General;  Laterality: Left;    FAMILY HISTORY Family History  Problem Relation Age of Onset  . Dementia Father     deceased age 48 secondary to dementia  . Bipolar disorder Father   . Emphysema Father   . Heart disease Father   . CAD Mother 67    Died age 24 of CAD  . Heart disease Mother   . CAD Maternal Grandfather 39   patient's father died at the age of 5 from complications of COPD. The patient's mother died from heart disease at the age of 67. The patient had 2 brothers and 2 sisters. There is no history of breast or ovarian cancer in the family.  GYNECOLOGIC HISTORY:  Menarche age 13. The patient never carried a child to term. She still having periods, although these are irregular. She has a history of endometrial hyperplasia, status post multiple biopsies. 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 She is divorced. Her significant other Stacey Roberts is a IT consultant. They live together, with 2 cats. Stacey Roberts does not have children himself. The patient is not a church attender    ADVANCED DIRECTIVES: Not in place   HEALTH MAINTENANCE: History  Substance Use Topics  .  Smoking status: Former Smoker    Types: Cigarettes  . Smokeless tobacco: Never Used     Comment: Smoked on and off for 20 years. Quit about 5 years ago.  . Alcohol Use: No     Colonoscopy:  PAP:  Bone density:  Lipid panel:  Allergies  Allergen Reactions  . Penicillins Shortness Of Breath and Rash  . Ace Inhibitors     REACTION: cough    Current Outpatient Prescriptions  Medication Sig Dispense Refill  . aspirin 81 MG tablet Take 81 mg by mouth daily.        . Canagliflozin (INVOKANA) 300 MG TABS Take 300 mg by mouth daily.      Marland Kitchen glucose blood (ONE TOUCH TEST STRIPS) test strip Use to check blood sugar three times a day  100 each  3  . levothyroxine (SYNTHROID, LEVOTHROID) 150 MCG tablet Take 150 mcg by mouth daily before breakfast.      . metFORMIN (GLUCOPHAGE) 1000 MG tablet Take 1,000 mg by mouth 2 (two) times daily with a meal.      . Multiple Vitamin (MULTIVITAMIN WITH MINERALS) TABS tablet Take 1 tablet by mouth  daily.      . olmesartan (BENICAR) 20 MG tablet Take 20 mg by mouth daily.      . ONE TOUCH LANCETS MISC Use to check blood sugar three time a day       . oxyCODONE-acetaminophen (ROXICET) 5-325 MG per tablet Take 1-2 tablets by mouth every 4 (four) hours as needed for severe pain.  40 tablet  0  . pantoprazole (PROTONIX) 40 MG tablet Take 1 tablet (40 mg total) by mouth daily.  90 tablet  3  . saxagliptin HCl (ONGLYZA) 5 MG TABS tablet Take 5 mg by mouth daily.      . [DISCONTINUED] Calcium Carbonate 1500 MG TABS Take 1,500 mg by mouth daily.         No current facility-administered medications for this visit.    OBJECTIVE: Middle-aged white woman in no acute distress Filed Vitals:   02/18/13 1620  BP: 125/83  Pulse: 92  Temp: 97.7 F (36.5 C)  Resp: 18     Body mass index is 43.54 kg/(m^2).    ECOG FS:1 - Symptomatic but completely ambulatory  Ocular: Sclerae unicteric, pupils equal, round and reactive to light HEENT: Normocephalic.: Oropharynx clear,  no thrush or other lesions Lymphatic: No cervical or supraclavicular adenopathy Lungs no rales or rhonchi, good excursion bilaterally Heart regular rate and rhythm, no murmur appreciated Abd soft, obese, nontender, positive bowel sounds MSK no focal spinal tenderness, no joint edema Neuro: non-focal, well-oriented, pleasant affect Breasts: The right breast is unremarkable. The left breast is status post recent lumpectomy. There is an area of shallow dehiscence measuring approximately 2 cm towards the medial aspect of the wound. There is a separate area of leakage however, with serosanguineous fluid in the bandage. The cosmetic result is good. The left axilla is benign. Skin: No hamartomas noted; there is a round scaly erythematous 3 cm lesion in the left forearm consistent with psoriasis   LAB RESULTS:  CMP     Component Value Date/Time   NA 137 02/18/2013 1554   NA 134* 01/03/2013 0915   K 4.4 02/18/2013 1554   K 4.7 01/03/2013 0915   CL 97 01/03/2013 0915   CO2 25 02/18/2013 1554   CO2 24 01/03/2013 0915   GLUCOSE 300* 02/18/2013 1554   GLUCOSE 308* 01/03/2013 0915   BUN 14.2 02/18/2013 1554   BUN 11 01/03/2013 0915   CREATININE 0.8 02/18/2013 1554   CREATININE 0.51 01/03/2013 0915   CALCIUM 9.2 02/18/2013 1554   CALCIUM 9.1 01/03/2013 0915   PROT 7.2 02/18/2013 1554   PROT 6.9 11/15/2011 0946   ALBUMIN 3.6 02/18/2013 1554   ALBUMIN 3.3* 11/15/2011 0946   AST 10 02/18/2013 1554   AST 11 11/15/2011 0946   ALT 16 02/18/2013 1554   ALT 15 11/15/2011 0946   ALKPHOS 85 02/18/2013 1554   ALKPHOS 78 11/15/2011 0946   BILITOT 0.33 02/18/2013 1554   BILITOT 0.3 11/15/2011 0946   GFRNONAA >90 01/03/2013 0915   GFRAA >90 01/03/2013 0915    I No results found for this basename: SPEP,  UPEP,   kappa and lambda light chains    Lab Results  Component Value Date   WBC 9.4 02/18/2013   NEUTROABS 6.7* 02/18/2013   HGB 12.5 02/18/2013   HCT 39.6 02/18/2013   MCV 82.6 02/18/2013   PLT 306  02/18/2013      Chemistry      Component Value Date/Time   NA 137 02/18/2013 1554   NA 134*  01/03/2013 0915   K 4.4 02/18/2013 1554   K 4.7 01/03/2013 0915   CL 97 01/03/2013 0915   CO2 25 02/18/2013 1554   CO2 24 01/03/2013 0915   BUN 14.2 02/18/2013 1554   BUN 11 01/03/2013 0915   CREATININE 0.8 02/18/2013 1554   CREATININE 0.51 01/03/2013 0915      Component Value Date/Time   CALCIUM 9.2 02/18/2013 1554   CALCIUM 9.1 01/03/2013 0915   ALKPHOS 85 02/18/2013 1554   ALKPHOS 78 11/15/2011 0946   AST 10 02/18/2013 1554   AST 11 11/15/2011 0946   ALT 16 02/18/2013 1554   ALT 15 11/15/2011 0946   BILITOT 0.33 02/18/2013 1554   BILITOT 0.3 11/15/2011 0946       No results found for this basename: LABCA2    No components found with this basename: LABCA125    No results found for this basename: INR,  in the last 168 hours  Urinalysis    Component Value Date/Time   COLORURINE YELLOW 03/15/2009 0835   APPEARANCEUR CLOUDY* 03/15/2009 0835   LABSPEC 1.024 03/15/2009 0835   PHURINE 7.5 03/15/2009 0835   GLUCOSEU NEGATIVE 03/15/2009 0835   HGBUR NEGATIVE 03/15/2009 [place] 03/15/2009 [place] 03/15/2009 0835   PROTEINUR NEGATIVE 03/15/2009 0835   UROBILINOGEN 1.0 03/15/2009 0835   NITRITE NEGATIVE 03/15/2009 0835   LEUKOCYTESUR TRACE* 03/15/2009 0835    STUDIES: Mr Breast Bilateral W Wo Contrast  01/20/2013   CLINICAL DATA:  Recently diagnosed low-grade ductal carcinoma in situ involving and intraductal papilloma on surgical excisional biopsy. Focally positive margin.  EXAM: BILATERAL BREAST MRI WITH AND WITHOUT CONTRAST  TECHNIQUE: Multiplanar, multisequence MR images of both breasts were obtained prior to and following the intravenous administration of 52ml of MultiHance  THREE-DIMENSIONAL MR IMAGE RENDERING ON INDEPENDENT WORKSTATION:  Three-dimensional MR images were rendered by post-processing of the original MR data on an independent workstation.  The three-dimensional MR images were interpreted, and findings are reported in the following complete MRI report for this study. Three dimensional images were evaluated at the independent DynaCad workstation  COMPARISON:  Previous exams  FINDINGS: Breast composition: b. Scattered fibroglandular tissue  Background parenchymal enhancement: Minimal  Right breast: No mass or abnormal enhancement.  Left breast: There are postoperative changes present within the central/superior left breast with a seroma measuring 8.5 cm in greatest dimension present. There are no areas of worrisome enhancement within the left breast.  Lymph nodes: No abnormal appearing lymph nodes.  Ancillary findings:  None.  IMPRESSION: Postoperative changes within the left breast within a 8.5 cm seroma present. No findings worrisome for malignancy.  RECOMMENDATION: Treatment plan.  BI-RADS CATEGORY  2: Benign Finding(s)   Electronically Signed   By: Luberta Robertson M.D.   On: 01/20/2013 10:33    ASSESSMENT: 48 y.o. [place], [place] woman  (1) status post left lumpectomy 01/06/2013 for a 1.5 cm ductal carcinoma in situ, grade 1, estrogen receptor 99% positive, progesterone receptor 100% positive, with a focally positive margin  (2) status post additional surgery for margin clearance 01/22/2013 showed the posterior margin negative at 0.1 cm  PLAN: We spent the better part of today's hour-long appointment discussing the biology of breast cancer in general, and the specifics of the patient's tumor in particular. Nicole Kindred understands noninvasive breast cancer in itself is not life-threatening, as the cancer cells cannot travel to vital organ. For that reason we recommend that she keep her breasts, as indeed she  has chosen to do.  This does mean she accept some risk of the cancer coming back in that breast. She will reduce that risk significantly with radiation therapy. She will meet with Dr. Pablo Ledger tomorrow for further information regarding that. After  that, she can reduce that risk further with anti-estrogens.  In her case antiestrogen is may be complex, the dose tamoxifen, which is used routinely in premenopausal woman, can worsen her already significant endometrial hyperplasia problem. For that reason I will probably suggest that she go on goserelin and aromatase inhibitors. We will discuss that at length at her next visit with me, which will be in approximately 2 months.  NCCN guidelines suggest that a 1 mm margin is not adequate for DCIS. We will discuss this tomorrow at the multidisciplinary breast cancer conference. The question is how much benefit would she received in terms of local control if she had a third surgery, which she is understandably not keen to do. We will have a consensus statement for her, but she understands the ultimate decision will be hers, once she is appropriately informed.  Given the coexistence of breast cancer and thyroid cancer in a young woman I am referring her to genetics for evaluation of possible countenance syndrome, although I don't see any hamartomas and the patient is normocephalic  Nicole Kindred has a good understanding of the overall plan. She agrees with it. She does the intent of treatment in her case is cure. She will call with any problems that may develop before her next visit here.    Chauncey Cruel, MD   02/18/2013 5:59 PM

## 2013-02-19 ENCOUNTER — Ambulatory Visit
Admission: RE | Admit: 2013-02-19 | Discharge: 2013-02-19 | Disposition: A | Discharge status: Home / Self Care | Payer: 59 | Source: Ambulatory Visit | Attending: Radiation Oncology | Admitting: Radiation Oncology

## 2013-02-19 ENCOUNTER — Encounter: Payer: Self-pay | Admitting: Radiation Oncology

## 2013-02-19 ENCOUNTER — Other Ambulatory Visit: Payer: Self-pay | Admitting: Oncology

## 2013-02-19 VITALS — BP 129/88 | HR 98 | Temp 98.6°F | Wt 254.7 lb

## 2013-02-19 DIAGNOSIS — C50119 Malignant neoplasm of central portion of unspecified female breast: Secondary | ICD-10-CM

## 2013-02-19 NOTE — Progress Notes (Signed)
Location of Breast Cancer:Left ductal carcinoma in situ 8.5 cm seroma  Histology per Pathology Report 01/06/13 Diagnosis Breast, lumpectomy, Left - LOW GRADE DUCTAL CARCINOMA IN SITU INVOLVING AN INTRADUCTAL PAPILLOMA. - INKED SURGICAL MARGIN IS FOCALLY POSITIVE. - SEE ONCOLOGY TEMPLATE. 1 of 01/22/13 Diagnosis 1. Breast, biopsy, Left - FIBROCYSTIC CHANGES WITH FOCAL INFLAMMATION AND FIBROSIS. - NO EVIDENCE OF MALIGNANCY. 2. Breast, excision, Left posterior margin - FOCAL DUCTAL CARCINOMA IN SITU, FOCALLY 0.1 CM FROM THE MARGIN.  Receptor Status: ER+), PR (+), Her2-neu  Did patient present with symptoms (if so, please note symptoms) or was this found on screening mammography?: patient concerned of spontaneous left bloody nipple discharge MM on 12/24/12 revealed no evidence of malignancy. Ductogram performed on left breast on 12/24/12.  Past/Anticipated interventions by surgeon, if any:Left breast lumpectomy with low grade ductal carcinoma in situ involving an intraductal papilloma on 01/06/2013.  Past/Anticipated interventions by medical oncology, if any: Chemotherapy not required.To follow up with Dr.Magrinat after completion of radiation to discuss aromatase inhibitors and goserelin as at risk for worsening endometrial hyperplasia if placed on tamoxifen.  Lymphedema issues, if any: No  Pain issues, if any: takes occasional pain med at bedtime.  SAFETY ISSUES:  Prior radiation? No  Pacemaker/ICD? No  Possible current pregnancy?No, Mirena  IUD in place  Is the patient on methotrexate?No  Current Complaints / other details;Patient here with significant other.Menarche age 14.No full-term births.Radiology scheduler at .    ,  Marie, RN 02/19/2013,11:53 AM   

## 2013-02-19 NOTE — Progress Notes (Signed)
Please see the Nurse Progress Note in the MD Initial Consult Encounter for this patient. 

## 2013-02-20 ENCOUNTER — Other Ambulatory Visit: Payer: Self-pay | Admitting: Oncology

## 2013-02-20 ENCOUNTER — Telehealth: Payer: Self-pay | Admitting: Oncology

## 2013-02-20 NOTE — Progress Notes (Signed)
Radiation Oncology         [id number]) [id number] ________________________________  Initial outpatient Consultation - Date: 02/19/2013   Name: Stacey Roberts MRN: [id number]   DOB: October 25, 1965  REFERRING PHYSICIAN: Harl Bowie, MD  DIAGNOSIS: DCIS of the left breast  HISTORY OF PRESENT ILLNESS::Stacey Roberts is a 48 y.o. female  who has undergone regular screening mammograms. She developed spontaneous left bloody nipple discharge in December of this year. A mammogram was negative for malignancy. A ductogram was performed which showed filling defect. Surgical excision was recommended. 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 The inked surgical margin was focally positive. The DCIS was ER positive at 99% PR positive at 100%. The tumor size was noted to be at least 1.5 cm. She underwent reexcision on 01/22/2013 which showed fibrocystic change but another area of focal ductal carcinoma in situ with 0.1 cm from the posterior margin. All in all the initial specimen was 10.3 x 7.5 x 3.4 cm. The reexcision specimens were 2.3 x 1.6 x 0.5 cm and 7.8 x 6.5 x 2.1 cm. She has seen medical oncology and surgery. We discussed her case in conference this morning. She does have a history of papillary thyroid cancer removed in 2011. She received radioactive iodine in April of 2011 and had a repeat scan in 2012 which was negative. She does also have a history of polycystic ovarian syndrome. She is GX P0 she doesn't have a history of irregular periods and therefore has a IUD in place to 2 endometrial hyperplasia. She has menarche at 57. There is no history of malignancy in the family. She has had some swelling in her breast and some fluid collection but really no pain. He has had some difficulties with wound healing and is being followed for by surgery for this.  PREVIOUS RADIATION THERAPY: No  PAST MEDICAL HISTORY:  has a past medical  history of Diabetes mellitus, type II; Obesity; Hypertension; Hyperlipidemia; Thyroid cancer; Bronchitis; Hypothyroidism; Snoring; Sleep apnea (2008); and GERD (gastroesophageal reflux disease).    PAST SURGICAL HISTORY: Past Surgical History  Procedure Laterality Date  . Tonsillectomy  1982  . Dilation and curettage of uterus  2004    s/p  . Thyroidectomy  02/2009    Dr Harlow Asa  . Tonsillectomy    . Breast biopsy  2000    s/p  . Breast lumpectomy Left 01/06/2013    Procedure: LUMPECTOMY;  Surgeon: Harl Bowie, MD;  Location: [place] [facility];  Service: General;  Laterality: Left;  . Breast biopsy Left 01/22/2013    Procedure: RE-EXCISION LEFT BREAST DCIS;  Surgeon: Harl Bowie, MD;  Location: [place];  Service: General;  Laterality: Left;    FAMILY HISTORY:  Family History  Problem Relation Age of Onset  . Dementia Father     deceased age 35 secondary to dementia  . Bipolar disorder Father   . Emphysema Father   . Heart disease Father   . CAD Mother 66    Died age 28 of CAD  . Heart disease Mother   . CAD Maternal Grandfather 50    SOCIAL HISTORY:  History  Substance Use Topics  . Smoking status: Former Smoker    Types: Cigarettes  . Smokeless tobacco: Never Used     Comment: Smoked on and off for 20 years. Quit about 5 years ago.  . Alcohol Use: No    ALLERGIES: Penicillins and Ace  inhibitors  MEDICATIONS:  Current Outpatient Prescriptions  Medication Sig Dispense Refill  . aspirin 81 MG tablet Take 81 mg by mouth daily.        . Canagliflozin (INVOKANA) 300 MG TABS Take 300 mg by mouth daily.      Marland Kitchen glucose blood (ONE TOUCH TEST STRIPS) test strip Use to check blood sugar three times a day  100 each  3  . levothyroxine (SYNTHROID, LEVOTHROID) 150 MCG tablet Take 150 mcg by mouth daily before breakfast.      . metFORMIN (GLUCOPHAGE) 1000 MG tablet Take 1,000 mg by mouth 2 (two) times daily with a meal.      . Multiple Vitamin (MULTIVITAMIN  WITH MINERALS) TABS tablet Take 1 tablet by mouth daily.      Marland Kitchen olmesartan (BENICAR) 20 MG tablet Take 20 mg by mouth daily.      . ONE TOUCH LANCETS MISC Use to check blood sugar three time a day       . oxyCODONE-acetaminophen (ROXICET) 5-325 MG per tablet Take 1-2 tablets by mouth every 4 (four) hours as needed for severe pain.  40 tablet  0  . pantoprazole (PROTONIX) 40 MG tablet Take 1 tablet (40 mg total) by mouth daily.  90 tablet  3  . saxagliptin HCl (ONGLYZA) 5 MG TABS tablet Take 5 mg by mouth daily.      . [DISCONTINUED] Calcium Carbonate 1500 MG TABS Take 1,500 mg by mouth daily.         No current facility-administered medications for this encounter.    REVIEW OF SYSTEMS:  A 15 point review of systems is documented in the electronic medical record. This was obtained by the nursing staff. However, I reviewed this with the patient to discuss relevant findings and make appropriate changes.  Pertinent items are noted in HPI.  PHYSICAL EXAM:  Filed Vitals:   02/19/13 1412  BP: 129/88  Pulse: 98  Temp: 98.6 F (37 C)  .254 lb 11.2 oz (115.531 kg). Pleasant female in no distress sitting comfortably on examining table. She is alert and oriented x3.  LABORATORY DATA:  Lab Results  Component Value Date   WBC 9.4 02/18/2013   HGB 12.5 02/18/2013   HCT 39.6 02/18/2013   MCV 82.6 02/18/2013   PLT 306 02/18/2013   Lab Results  Component Value Date   NA 137 02/18/2013   K 4.4 02/18/2013   CL 97 01/03/2013   CO2 25 02/18/2013   Lab Results  Component Value Date   ALT 16 02/18/2013   AST 10 02/18/2013   ALKPHOS 85 02/18/2013   BILITOT 0.33 02/18/2013     RADIOGRAPHY: No results found.    IMPRESSION: DCIS of the left breast status post lumpectomy and reexcision for margins with a focally positive margin.  PLAN: I spoke to the patient and her significant other today regarding radiation treatment. She certainly does has a focally positive margin however it continues to concern me that  she has had multiple excisions of weight from the DCIS and a papilloma that we discovered initially and yet continues to have positive margins. Presumably she is about 10-11 cm from the initial lesion. None of this DCIS was seen mammographically which concerns me for followup imaging. We discussed that radiation can take your microscopic disease as we know from the whole breast versus mastectomy trials. We discussed the process of simulation the placement tattoos. We discussed 6 weeks of treatment as an outpatient. We discussed the process  of simulation the placement tattoos. We discussed skin redness and fatigue. We discussed the use of cardiac sparing V. breath hold technique. I does worry that we.have a good way to monitor her treatment 6 is as again this DCIS which has been scattered through her lumpectomy and then this reexcision specimen isn't mammographically occult. It is low-grade she doesn't however have any other medical comorbidities. She also has a history of abnormal uterine bleeding for which she has an IUD in place. This could make it difficult to monitor her on a medicine such as tamoxifen. All in all I would be in favor of reexcision and if she is found have further DCIS on this reexcision it would be reasonable to either proceed with radiation and hope that radiation to cure the microscopic disease is present in the breast or to proceed with mastectomy because clearly she has disease that we are not able to follow mammographically. I encouraged her to talk to radiology about followup imaging. She has a followup scheduled Dr. Ninfa Linden on next week. I encouraged her to call me after she met with Dr. Ninfa Linden. She understands there is no survival benefit to doing the reexcision. Mostly I does worry about whether she can be monitored at adequately after treatment. I spent 60 minutes  face to face with the patient and more than 50% of that time was spent in counseling and/or coordination of care.     ------------------------------------------------  Thea Silversmith, MD

## 2013-02-20 NOTE — Telephone Encounter (Signed)
sw. pt and advised on March and April appt....pt expressed taht she was not what she is going to do and will keep the appts for now.

## 2013-02-27 ENCOUNTER — Encounter (INDEPENDENT_AMBULATORY_CARE_PROVIDER_SITE_OTHER): Payer: Self-pay | Admitting: Surgery

## 2013-02-27 ENCOUNTER — Ambulatory Visit (INDEPENDENT_AMBULATORY_CARE_PROVIDER_SITE_OTHER): Payer: Commercial Managed Care - PPO | Admitting: Surgery

## 2013-02-27 VITALS — BP 136/72 | HR 70 | Resp 16 | Ht 64.0 in | Wt 255.0 lb

## 2013-02-27 DIAGNOSIS — Z09 Encounter for follow-up examination after completed treatment for conditions other than malignant neoplasm: Secondary | ICD-10-CM

## 2013-02-27 NOTE — Progress Notes (Signed)
Subjective:     Patient ID: Stacey Roberts, female   DOB: 17-Oct-1965, 48 y.o.   MRN: [id number]  HPI She is here for another followup. She has seen both medical and radiation oncology and has been presented at tumor board. She has no complaints today.  Review of Systems     Objective:   Physical Exam On exam, her incision is healing fairly well    Assessment:     Left breast DCIS     Plan:     She has made the decision that she was no further treatment. This includes both radiation and anti-hormonal therapy. Again, she had a focally close margin. I again had a long discussion with her. She is adamant about her decision. She does horribly but she will now have genetic testing. She seems very reasonable about her decision making process. I will continue to follow her closely.

## 2013-03-18 ENCOUNTER — Other Ambulatory Visit: Payer: Self-pay | Admitting: Internal Medicine

## 2013-04-08 ENCOUNTER — Telehealth: Payer: Self-pay | Admitting: Genetic Counselor

## 2013-04-08 ENCOUNTER — Ambulatory Visit (INDEPENDENT_AMBULATORY_CARE_PROVIDER_SITE_OTHER): Payer: 59 | Admitting: Family Medicine

## 2013-04-08 VITALS — BP 126/87 | Wt 252.0 lb

## 2013-04-08 DIAGNOSIS — E119 Type 2 diabetes mellitus without complications: Secondary | ICD-10-CM

## 2013-04-08 NOTE — Progress Notes (Signed)
Patient presents today for 3 month diabetes follow-up as part of the employer-sponsored [employer] program. Current diabetes regimen includes Metformin and Invokana. Patient also continues on daily ASA and ARB. Most recent MD follow-up was Sept 2014 with Dr. Loanne Drilling. She will follow-up next week. Of note, patient was diagnosed with DCIS (ductal carcinoma in situ) and has underwent two surgeries at this time. She does not plan to pursue further treatment at this time and will continue being followed by MD. Also, she has discontinued Onglyza as she did not feel it was providing any improvement in glucose.   Diabetes Assessment: Type of Diabetes: Type 2; Sees Diabetes provider 2 times per year; checks feet daily; hypoglycemia frequency None reported; uses glucometer; takes medications as prescribed; takes an aspirin a day; MD managing Diabetes Renato Shin, Endo; checks blood glucose 1-2 times a week; Highest CBG >200; A1c 7.7 (prev 8.8 Sept 2014) Other Diabetes History: Current med regimen includes Metformin 1000 mg twice daily, and Invokana 300 mg daily. Patient discontinued Onglyza on her own as she did not believe it was providing any benefit to her glucose control. Patient attempts to maintain good medication compliance but does struggle with remembering Invokana at times. I have encouraged her to be more diligent with this. Patient did not bring meter today but is currently testing 1-2 times per week, but is aware of the need for more frequent testing. Pt reports hyperglycemia with fasting glucose usually upper 100s to >200. Hypoglycemia frequency is rare. Patient does demonstrate appropriate correction of hypoglycemia. Patient denies signs and symptoms of neuropathy including numbness/tingling/burning and symptoms of foot infection. Patient is not due for yearly eye exam, but will be soon and needs to schedule an exam for May 2015.  Lifestyle Factors: Diet - Diet has deteriorated over the previous  three months due to stress. Patient was diagnosed with DCIS of breast and has struggled with this a great deal. Also, her boyfriend recently lost his job and is now in the process of applying for other positions. This has caused much emotional and financial stress. Patient struggles with stress eating and admits that her diet is largely uncontrolled. I did not press this issue today as she became tearful and seemed to feel much guilt over loss of control of her diet. She is aware of the changes that need to be made and I have encouraged to her focus on very small and acheivable goals at first as she tries to regain control of her diet.  Exercise - No exercise at this time. She cancelled her membership to [employer] after her boyfriend lost his job. I have encouraged her to take advantage of the new [place] fitness center once it opens and to take advantage of the warmer spring days, as getting out in the sun and exercising will likely help weight and improve emotional stress.  Assessment: Patient has allowed lifestyle and DM control to deteriorate over the past 3 months, largely due to emotional stress. Despite lack of diabetes control A1c has decreased from 8.8 to 7.7. Patient was pleased but surprised by this. Patient is aware of her need to improve both diet and exercise and will work on small goals over the next several weeks. She will follow-up with MD in 1 week. I will follow-up in 3 months.   Plan: 1) Attempt to improve compliance with Invokana 2) Attempt to increase exercise 1-2 times per week, take advantage of new fitness facility and warmer weather 3) Attmept to  improve diet and consider making only 1-2 small changes at a time.  4) Continue testing regularly 5) Follow-up in 3 months on Tuesday June 16th @ 10:30 am

## 2013-04-08 NOTE — Telephone Encounter (Signed)
Offered to move her appointment up to 3/18.,  She wants to keep it in April.

## 2013-04-14 ENCOUNTER — Telehealth: Payer: Self-pay | Admitting: Oncology

## 2013-04-14 NOTE — Telephone Encounter (Signed)
, °

## 2013-04-16 ENCOUNTER — Ambulatory Visit: Payer: Self-pay | Admitting: Oncology

## 2013-04-21 ENCOUNTER — Encounter: Payer: Self-pay | Admitting: Endocrinology

## 2013-04-21 ENCOUNTER — Ambulatory Visit (INDEPENDENT_AMBULATORY_CARE_PROVIDER_SITE_OTHER): Payer: 59 | Admitting: Endocrinology

## 2013-04-21 VITALS — BP 122/80 | HR 86 | Temp 97.9°F | Ht 64.0 in | Wt 253.0 lb

## 2013-04-21 DIAGNOSIS — C73 Malignant neoplasm of thyroid gland: Secondary | ICD-10-CM

## 2013-04-21 DIAGNOSIS — IMO0001 Reserved for inherently not codable concepts without codable children: Secondary | ICD-10-CM

## 2013-04-21 DIAGNOSIS — E1165 Type 2 diabetes mellitus with hyperglycemia: Principal | ICD-10-CM

## 2013-04-21 DIAGNOSIS — E890 Postprocedural hypothyroidism: Secondary | ICD-10-CM

## 2013-04-21 LAB — MICROALBUMIN / CREATININE URINE RATIO
Creatinine,U: 66.2 mg/dL
MICROALB/CREAT RATIO: 3.6 mg/g (ref 0.0–30.0)
Microalb, Ur: 2.4 mg/dL — ABNORMAL HIGH (ref 0.0–1.9)

## 2013-04-21 LAB — HEMOGLOBIN A1C: Hgb A1c MFr Bld: 8.2 % — ABNORMAL HIGH (ref 4.6–6.5)

## 2013-04-21 LAB — TSH: TSH: 0.55 u[IU]/mL (ref 0.35–5.50)

## 2013-04-21 MED ORDER — SITAGLIPTIN PHOSPHATE 100 MG PO TABS: 100.0000 mg | ORAL_TABLET | Freq: Every day | ORAL | Status: DC

## 2013-04-21 MED ORDER — PIOGLITAZONE HCL 45 MG PO TABS: 45.0000 mg | ORAL_TABLET | Freq: Every day | ORAL | Status: DC

## 2013-04-21 NOTE — Patient Instructions (Addendum)
blood tests are being requested for you today.  We'll contact you with results.  i have sent a prescription to your pharmacy, to add "januvia."  check your blood sugar once a day.  vary the time of day when you check, between before the 3 meals, and at bedtime.  also check if you have symptoms of your blood sugar being too high or too low.  please keep a record of the readings and bring it to your next appointment here.  You can write it on any piece of paper.  please call us sooner if your blood sugar goes below 70, or if you have a lot of readings over 200.  Please see Dr Shawna Orleans about the blood pressure.  Please come back for a follow-up appointment in 6 months.

## 2013-04-21 NOTE — Progress Notes (Signed)
Subjective:    Patient ID: Stacey Roberts, female    DOB: [birth year], 48 y.o.   MRN: [id number]  HPI Pt returns for f/u of papillary adenocarcinoma of the thyroid.  She presented with a slight sensation of fullness at the anterior neck, but no assoc pain.   2/11: thyroidectomy: T1b N0 M0.  4/11: i-131 103 mci, with thyrogen.  12/11: neck US: single enlarged right cervical lymph node, nonspecific.   6/12: body scan (thyrogen) neg.   12/12: neck US: The lymph node described in the right neck was present previously by ultrasound and          is stable in size and appearance with prior measurements of approximately 2.8 x 1.1 x 1.5 cm.          This lymph node is likely benign. The soft tissue nodule in the left neck was also previously        visualized and may be slightly larger in volume. 6/13: A single right-sided cervical lymph node has similar morphology since the prior ultrasound study          and is slightly smaller in measurement. This lymph node measures approximately 1.7 x 1.1 x 1.3         cm by current ultrasound. Previous ultrasound demonstrated slightly larger dimensions of                     approximately 2.6 x 1.1 x 2.3 cm. This is clearly in the same region as the prior ultrasound and the         lymph node continues to show normal appearing lymph node architecture by ultrasound. A                  smaller 0.9 cm lower right cervical lymph node appears benign. Largest lymph node in the left               neck measures approximately 1.3 x 0.9 x 1.2 cm. This lymph node appears less prominent                    compared to the prior ultrasound. A second left-sided cervical lymph node measures            approximately 1.3 x 0.6 x 0.7 cm. This was not definitively measured previously. 12/13: thyroid US: 1. No further prominent right cervical lymph nodes. Single identifiable node is not          enlarged and has a benign appearance. 2. Possibly mildly increased maximal length of the dominant      left cervical lymph node. Overall node morphology and volume show very little change over the last 6     months. The second identifiable left cervical lymph node shows reduction in size since the prior      Exam. 9/14  TG undetectable (ab neg) She does not notice any lump in the neck.   On the reduced synthroid, she feels no different.   Pt also returns for f/u of type 2 DM (dx'ed 2012; she has mild if any neuropathy of the lower extremities; no known associated chronic complications; she has never taken insulin; she declines weight-loss surgery).  She denies excessive urination.   Past Medical History  Diagnosis Date  . Diabetes mellitus, type II   . Obesity   . Hypertension   . Hyperlipidemia   . 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 Buddy Duty  . Bronchitis     hx of  . Hypothyroidism   . Snoring   . Sleep apnea 2008    severe OSA-does not use a cpap  . GERD (gastroesophageal reflux disease)     Past Surgical History  Procedure Laterality Date  . Tonsillectomy  1982  . Dilation and curettage of uterus  2004    s/p  . Thyroidectomy  02/2009    Dr Harlow Asa  . Tonsillectomy    . Breast biopsy  2000    s/p  . Breast lumpectomy Left 01/06/2013    Procedure: LUMPECTOMY;  Surgeon: Harl Bowie, MD;  Location: [facility];  Service: General;  Laterality: Left;  . Breast biopsy Left 01/22/2013    Procedure: RE-EXCISION LEFT BREAST DCIS;  Surgeon: Harl Bowie, MD;  Location: [facility];  Service: General;  Laterality: Left;    History   Social History  . Marital Status: Divorced    Spouse Name: N/A    Number of Children: N/A  . Years of Education: N/A   Occupational History  .  [employer]    Outpatient scheduling in radiology   Social History Main Topics  . Smoking status: Former Smoker    Types: Cigarettes  . Smokeless tobacco: Never Used      Comment: Smoked on and off for 20 years. Quit about 5 years ago.  . Alcohol Use: No  . Drug Use: No  . Sexual Activity: Not on file   Other Topics Concern  . Not on file   Social History Narrative   The patient is divorced, moved to [place] in   2006 from [place].  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    No alcohol.  Tobacco use:  She quit 5 years ago.  She smoked on   and off for approximately 20 years.  No history of recreational drug   Use.Drinks two cups of coffee per work day.     Current Outpatient Prescriptions on File Prior to Visit  Medication Sig Dispense Refill  . aspirin 81 MG tablet Take 81 mg by mouth daily.        . Canagliflozin (INVOKANA) 300 MG TABS Take 300 mg by mouth daily.      Marland Kitchen glucose blood (ONE TOUCH TEST STRIPS) test strip Use to check blood sugar three times a day  100 each  3  . levothyroxine (SYNTHROID, LEVOTHROID) 150 MCG tablet Take 150 mcg by mouth daily before breakfast.      . metFORMIN (GLUCOPHAGE) 1000 MG tablet Take 1,000 mg by mouth 2 (two) times daily with a meal.      . Multiple Vitamin (MULTIVITAMIN WITH MINERALS) TABS tablet Take 1 tablet by mouth daily.      Marland Kitchen olmesartan (BENICAR) 20 MG tablet Take 20 mg by mouth daily.      . ONE TOUCH LANCETS MISC Use to check blood sugar three time a day       . oxyCODONE-acetaminophen (ROXICET) 5-325 MG per tablet Take 1-2 tablets by mouth every 4 (four) hours as needed for severe pain.  40 tablet  0  . pantoprazole (PROTONIX) 40 MG tablet TAKE 1 TABLET BY MOUTH DAILY.  90 tablet  3  . [DISCONTINUED] Calcium Carbonate 1500 MG TABS Take 1,500 mg by mouth daily.  No current facility-administered medications on file prior to visit.    Allergies  Allergen Reactions  . Penicillins Shortness Of Breath and Rash  . Ace Inhibitors     REACTION: cough    Family History  Problem Relation Age of  Onset  . Dementia Father     deceased age 1 secondary to dementia  . Bipolar disorder Father   . Emphysema Father   . Heart disease Father   . CAD Mother 13    Died age 42 of CAD  . Heart disease Mother   . CAD Maternal Grandfather 60    BP 122/80  Pulse 86  Temp(Src) 97.9 F (36.6 C) (Oral)  Ht 5\' 4"  (1.626 m)  Wt 253 lb (114.76 kg)  BMI 43.41 kg/m2  SpO2 98%  Review of Systems Denies vaginal d/c.  She has minimal itching.     Objective:   Physical Exam VITAL SIGNS:  See vs page GENERAL: no distress Neck: a healed scar is present.  i do not appreciate a nodule in the thyroid or elsewhere in the neck.   Lab Results  Component Value Date   TSH 0.55 04/21/2013   Lab Results  Component Value Date   HGBA1C 8.2* 04/21/2013      Assessment & Plan:  Type 2 DM: she needs increased rx Stage 1 differentiated thyroid cancer, no clinical evidence of recurrence. Postsurgical hypothyroidism: given her disease-free interval, we can dose her synthroid to a normal TSH.

## 2013-04-22 LAB — THYROGLOBULIN LEVEL

## 2013-04-22 LAB — THYROGLOBULIN ANTIBODY

## 2013-04-25 ENCOUNTER — Telehealth: Payer: Self-pay | Admitting: *Deleted

## 2013-04-25 NOTE — Telephone Encounter (Signed)
Mailed after appt letter to pt. 

## 2013-04-28 ENCOUNTER — Encounter: Payer: Self-pay | Admitting: Genetic Counselor

## 2013-04-28 ENCOUNTER — Other Ambulatory Visit: Payer: Self-pay

## 2013-05-19 NOTE — Progress Notes (Signed)
Patient ID: Stacey Roberts, female   DOB: 1965/04/29, 48 y.o.   MRN: [id number] ATTENDING PHYSICIAN NOTE: I have reviewed the chart and agree with the plan as detailed above. Dorcas Mcmurray MD Pager [phone]

## 2013-05-20 ENCOUNTER — Telehealth: Payer: Self-pay | Admitting: *Deleted

## 2013-05-20 NOTE — Telephone Encounter (Signed)
Left message for pt to return my call so I can reschedule her genetic appt. 

## 2013-05-23 ENCOUNTER — Telehealth: Payer: Self-pay | Admitting: *Deleted

## 2013-05-23 NOTE — Telephone Encounter (Signed)
Called pt to inform her that I needed to reschedule her genetic appt due to Stacey Roberts no longer being with [place] and confirmed her for 06/30/13.

## 2013-06-02 ENCOUNTER — Telehealth: Payer: Self-pay | Admitting: Oncology

## 2013-06-03 ENCOUNTER — Ambulatory Visit: Payer: Self-pay | Admitting: Oncology

## 2013-06-12 ENCOUNTER — Other Ambulatory Visit: Payer: Self-pay

## 2013-06-12 ENCOUNTER — Encounter: Payer: Self-pay | Admitting: Genetic Counselor

## 2013-06-26 ENCOUNTER — Telehealth: Payer: Self-pay

## 2013-06-26 MED ORDER — METFORMIN HCL 1000 MG PO TABS: 1000.0000 mg | ORAL_TABLET | Freq: Two times a day (BID) | ORAL | Status: DC

## 2013-06-26 NOTE — Telephone Encounter (Signed)
Received a refill request for metformin. Medication is under a hospital provider.  Please advise if ok to refill. Thanks!

## 2013-06-26 NOTE — Telephone Encounter (Signed)
Ok, please refill prn 

## 2013-06-26 NOTE — Telephone Encounter (Signed)
Rx sent to pharmacy   

## 2013-06-30 ENCOUNTER — Other Ambulatory Visit: Payer: Self-pay

## 2013-07-11 ENCOUNTER — Other Ambulatory Visit: Payer: Self-pay | Admitting: *Deleted

## 2013-07-11 MED ORDER — LEVOTHYROXINE SODIUM 150 MCG PO TABS: 150.0000 ug | ORAL_TABLET | Freq: Every day | ORAL | Status: DC

## 2013-07-14 ENCOUNTER — Other Ambulatory Visit: Payer: Self-pay | Admitting: Internal Medicine

## 2013-07-17 ENCOUNTER — Other Ambulatory Visit: Payer: Self-pay | Admitting: Internal Medicine

## 2013-07-21 ENCOUNTER — Other Ambulatory Visit: Payer: Self-pay

## 2013-07-21 ENCOUNTER — Encounter: Payer: Self-pay | Admitting: Endocrinology

## 2013-07-22 ENCOUNTER — Other Ambulatory Visit: Payer: Self-pay | Admitting: Endocrinology

## 2013-07-22 MED ORDER — OLMESARTAN MEDOXOMIL 20 MG PO TABS: 20.0000 mg | ORAL_TABLET | Freq: Every day | ORAL | Status: DC

## 2013-07-30 ENCOUNTER — Ambulatory Visit (HOSPITAL_BASED_OUTPATIENT_CLINIC_OR_DEPARTMENT_OTHER): Payer: 59 | Admitting: Oncology

## 2013-07-30 ENCOUNTER — Telehealth: Payer: Self-pay | Admitting: Oncology

## 2013-07-30 VITALS — BP 122/82 | HR 92 | Temp 98.1°F | Resp 18 | Ht 64.0 in | Wt 253.1 lb

## 2013-07-30 DIAGNOSIS — Z170 Estrogen receptor positive status [ER+]: Secondary | ICD-10-CM

## 2013-07-30 DIAGNOSIS — F341 Dysthymic disorder: Secondary | ICD-10-CM

## 2013-07-30 DIAGNOSIS — E119 Type 2 diabetes mellitus without complications: Secondary | ICD-10-CM

## 2013-07-30 DIAGNOSIS — C50112 Malignant neoplasm of central portion of left female breast: Secondary | ICD-10-CM

## 2013-07-30 DIAGNOSIS — D0590 Unspecified type of carcinoma in situ of unspecified breast: Secondary | ICD-10-CM

## 2013-07-30 DIAGNOSIS — C73 Malignant neoplasm of thyroid gland: Secondary | ICD-10-CM

## 2013-07-30 NOTE — Progress Notes (Signed)
[place]  Telephone:([phone]) [phone] Fax:(336) [phone]     ID: Stacey Roberts OB: Feb 09, 1965  MR#: [id number]  IBB#:[id number]  PCP: Drema Pry, DO GYN:  Delila Pereyra SU: Coralie Keens OTHER MD: Minus Breeding, Lance Morin, Renato Shin  CHIEF COMPLAINT: Noninvasive breast cancer  CURRENT TREATMENT: Observation  HISTORY OF PRESENT ILLNESS: From the original intake note:  The patient had routine mammographic screening at the breast Center at 01/29/2012, which was negative. However in November 20 15th she developed a bloody left nipple discharge which she brought to Dr Roque Cash attention. He set her up for a left diagnostic mammogram and ultrasound at the breast Center 12-2012. The mammogram showed no suspicious mass, calcifications, or distortion. There was no palpable mass in the left breast by Dr.  Clarise Cruz exam, though a bloody nipple discharge was expressed from a single duct. Ultrasound showed normal tissue in the subareolar area, and no intraductal mass was identified.  On the same day a left unilateral ductogram was performed this showed an intraductal mass with abrupt cutoff of a duct in the central aspect of the breast, approximately 6 cm from the nipple. 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 The tumor was estrogen receptor 99% positive and progesterone receptor 100% positive, both with strong staining intensity. An inked surgical margin was focally positive  On 01/10/2013 the patient had right diagnostic mammography which showed again no suspicious masses or calcifications. Breast MRI 01/18/2013 showed post lumpectomy changes including a cavity measuring 8.6 cm. In the posterior medial aspect of this cavity there was a nodule of enhancement measuring 0.8 cm. This was suspicious  for residual disease and MRI guided tissue marker placement was suggested. Bilateral breast MRI on 01/20/2013 however showed no areas of worrisome enhancement in the left breast.  On 01/22/2013 the patient underwent additional surgery of the left breast for margin clearance. The left posterior margin was negative, focally 0.1 cm from the inked surface.  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 The tumor measured 1.7 cm, was confined within the thyroid parenchyma, but there was invasion of the capsule and there was angiolymphatic invasion noted (pT1b NX). She received it radioactive iodine therapy 05/06/2009 with a post therapy scan a week later showing focal uptake in the thyroid bed only, consistent with residual thyroid tissue. Repeat scan 07/15/2010 was negative  The patient's subsequent history is as detailed below.  INTERVAL HISTORY: Stacey Roberts returns today for followup of her noninvasive breast cancer. She tells me she met with the radiation oncologist and tells me she felt confused as to why she would need radiation or further surgery given that she had a non-life-threatening cancer. She made the decision that she did not want any kind of radiation. She is here now to discuss antiestrogen therapy  REVIEW OF SYSTEMS: Stacey Roberts is under a great deal of stress. Her job is not a problem. She enjoys that. However her significant other lost his job, was unemployed for quite a while, and is now working at a temporary job for much less today. He is very disgruntled, depressed, and anxious, and frustrated. She feels extremely anxious and as a result is not exercising, is eating "all the wrong things", her sugars aren't controlled and she is now taking them, and she has missed multiple appointments.  She for example never came to the genetics counseling that was scheduled because what we have to watch every panic". She has some joint pain and low back pain which is  not new and has not more persistent than before. She can't sleep. She feels moderately severely fatigued. There have been no cough, phlegm production, pleurisy, or change in bowel or bladder habits. There has been no bleeding. She has noted no change over either breast. A detailed review of systems today was otherwise noncontributory.  PAST MEDICAL HISTORY: Past Medical History  Diagnosis Date  . Diabetes mellitus, type II   . Obesity   . Hypertension   . Hyperlipidemia   . 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 Buddy Duty  . Bronchitis     hx of  . Hypothyroidism   . Snoring   . Sleep apnea 2008    severe OSA-does not use a cpap  . GERD (gastroesophageal reflux disease)     PAST SURGICAL HISTORY: Past Surgical History  Procedure Laterality Date  . Tonsillectomy  1982  . Dilation and curettage of uterus  2004    s/p  . Thyroidectomy  02/2009    Dr Harlow Asa  . Tonsillectomy    . Breast biopsy  2000    s/p  . Breast lumpectomy Left 01/06/2013    Procedure: LUMPECTOMY;  Surgeon: Harl Bowie, MD;  Location: [facility];  Service: General;  Laterality: Left;  . Breast biopsy Left 01/22/2013    Procedure: RE-EXCISION LEFT BREAST DCIS;  Surgeon: Harl Bowie, MD;  Location: [place];  Service: General;  Laterality: Left;    FAMILY HISTORY Family History  Problem Relation Age of Onset  . Dementia Father     deceased age 61 secondary to dementia  . Bipolar disorder Father   . Emphysema Father   . Heart disease Father   . CAD Mother 59    Died age 34 of CAD  . Heart disease Mother   . CAD Maternal Grandfather 23   patient's father died at the age of 65 from complications of COPD. The patient's mother died from heart disease at the age of 30. The patient had 2 brothers and 2 sisters. There is no history of breast or ovarian cancer in the family.  GYNECOLOGIC  HISTORY:  Menarche age 63. The patient never carried a child to term. She still having periods, although these are irregular. She has a history of endometrial hyperplasia, status post multiple biopsies. 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 She is divorced. Her significant other Stacey Roberts is a IT consultant. They live together, with 2 cats. Richard does not have children himself. The patient is not a church attender    ADVANCED DIRECTIVES: Not in place   HEALTH MAINTENANCE: History  Substance Use Topics  . Smoking status: Former Smoker    Types: Cigarettes  . Smokeless tobacco: Never Used     Comment: Smoked on and off for 20 years. Quit about 5 years ago.  . Alcohol Use: No     Colonoscopy:  PAP:  Bone density:  Lipid panel:  Allergies  Allergen Reactions  . Penicillins Shortness Of Breath and Rash  . Ace Inhibitors     REACTION: cough    Current Outpatient Prescriptions  Medication Sig Dispense Refill  . aspirin 81  MG tablet Take 81 mg by mouth daily.        . Canagliflozin (INVOKANA) 300 MG TABS Take 300 mg by mouth daily.      Marland Kitchen glucose blood (ONE TOUCH TEST STRIPS) test strip Use to check blood sugar three times a day  100 each  3  . levothyroxine (SYNTHROID, LEVOTHROID) 150 MCG tablet Take 1 tablet (150 mcg total) by mouth daily before breakfast.  30 tablet  2  . metFORMIN (GLUCOPHAGE) 1000 MG tablet Take 1 tablet (1,000 mg total) by mouth 2 (two) times daily with a meal.  180 tablet  1  . Multiple Vitamin (MULTIVITAMIN WITH MINERALS) TABS tablet Take 1 tablet by mouth daily.      Marland Kitchen olmesartan (BENICAR) 20 MG tablet Take 1 tablet (20 mg total) by mouth daily.  30 tablet  1  . ONE TOUCH LANCETS MISC Use to check blood sugar three time a day       . oxyCODONE-acetaminophen (ROXICET) 5-325 MG per tablet Take 1-2 tablets by mouth every 4 (four) hours as needed for severe pain.  40 tablet  0  .  pantoprazole (PROTONIX) 40 MG tablet TAKE 1 TABLET BY MOUTH DAILY.  90 tablet  3  . pioglitazone (ACTOS) 45 MG tablet Take 1 tablet (45 mg total) by mouth daily.  90 tablet  3  . sitaGLIPtin (JANUVIA) 100 MG tablet Take 1 tablet (100 mg total) by mouth daily.  90 tablet  3  . [DISCONTINUED] Calcium Carbonate 1500 MG TABS Take 1,500 mg by mouth daily.         No current facility-administered medications for this visit.    OBJECTIVE: Middle-aged white woman who was tearful during today's visit Filed Vitals:   07/30/13 1531  BP: 122/82  Pulse: 92  Temp: 98.1 F (36.7 C)  Resp: 18     Body mass index is 43.42 kg/(m^2).    ECOG FS:1 - Symptomatic but completely ambulatory  Ocular: Sclerae unicteric, EOMs intact; no macrocephaly HEENT: Normocephalic.: Oropharynx clear and moist Lymphatic: No cervical or supraclavicular adenopathy Lungs no rales or rhonchi Heart regular rate and rhythm Abd soft, obese, nontender, positive bowel sounds MSK no focal spinal tenderness, no upper extremity lymphedema Neuro: non-focal, well-oriented, anxious affect Breasts: The right breast is unremarkable. The left breast is status post lumpectomy. There is no evidence of local recurrence The left axilla is benign. Skin: No hamartomas noted   LAB RESULTS:  CMP     Component Value Date/Time   NA 137 02/18/2013 1554   NA 134* 01/03/2013 0915   K 4.4 02/18/2013 1554   K 4.7 01/03/2013 0915   CL 97 01/03/2013 0915   CO2 25 02/18/2013 1554   CO2 24 01/03/2013 0915   GLUCOSE 300* 02/18/2013 1554   GLUCOSE 308* 01/03/2013 0915   BUN 14.2 02/18/2013 1554   BUN 11 01/03/2013 0915   CREATININE 0.8 02/18/2013 1554   CREATININE 0.51 01/03/2013 0915   CALCIUM 9.2 02/18/2013 1554   CALCIUM 9.1 01/03/2013 0915   PROT 7.2 02/18/2013 1554   PROT 6.9 11/15/2011 0946   ALBUMIN 3.6 02/18/2013 1554   ALBUMIN 3.3* 11/15/2011 0946   AST 10 02/18/2013 1554   AST 11 11/15/2011 0946   ALT 16 02/18/2013 1554   ALT 15  11/15/2011 0946   ALKPHOS 85 02/18/2013 1554   ALKPHOS 78 11/15/2011 0946   BILITOT 0.33 02/18/2013 1554   BILITOT 0.3 11/15/2011 0946   GFRNONAA >90 01/03/2013 0915  GFRAA >90 01/03/2013 0915    I No results found for this basename: SPEP,  UPEP,   kappa and lambda light chains    Lab Results  Component Value Date   WBC 9.4 02/18/2013   NEUTROABS 6.7* 02/18/2013   HGB 12.5 02/18/2013   HCT 39.6 02/18/2013   MCV 82.6 02/18/2013   PLT 306 02/18/2013      Chemistry      Component Value Date/Time   NA 137 02/18/2013 1554   NA 134* 01/03/2013 0915   K 4.4 02/18/2013 1554   K 4.7 01/03/2013 0915   CL 97 01/03/2013 0915   CO2 25 02/18/2013 1554   CO2 24 01/03/2013 0915   BUN 14.2 02/18/2013 1554   BUN 11 01/03/2013 0915   CREATININE 0.8 02/18/2013 1554   CREATININE 0.51 01/03/2013 0915      Component Value Date/Time   CALCIUM 9.2 02/18/2013 1554   CALCIUM 9.1 01/03/2013 0915   ALKPHOS 85 02/18/2013 1554   ALKPHOS 78 11/15/2011 0946   AST 10 02/18/2013 1554   AST 11 11/15/2011 0946   ALT 16 02/18/2013 1554   ALT 15 11/15/2011 0946   BILITOT 0.33 02/18/2013 1554   BILITOT 0.3 11/15/2011 0946       No results found for this basename: LABCA2    No components found with this basename: LABCA125    No results found for this basename: INR,  in the last 168 hours  Urinalysis    Component Value Date/Time   COLORURINE YELLOW 03/15/2009 0835   APPEARANCEUR CLOUDY* 03/15/2009 0835   LABSPEC 1.024 03/15/2009 [place] 7.5 03/15/2009 [place] 03/15/2009 [place] 01/09/2008 [place] 03/15/2009 [place] 03/15/2009 [place] 03/15/2009 0835   PROTEINUR NEGATIVE 03/15/2009 0835   UROBILINOGEN 1.0 03/15/2009 0835   NITRITE NEGATIVE 03/15/2009 0835   LEUKOCYTESUR TRACE* 03/15/2009 0835    STUDIES: No results found.  ASSESSMENT: 48 y.o. Stacey Roberts, Stacey Roberts woman  (1) status post left lumpectomy 01/06/2013 for a 1.5 cm  ductal carcinoma in situ, grade 1, estrogen receptor 99% positive, progesterone receptor 100% positive, with a focally positive margin  (2) status post additional surgery for margin clearance 01/22/2013 showed the posterior margin negative at 0.1 cm  (3) the patient decided against adjuvant radiation  (4) the patient plans to start tamoxifen once her social situation is more stable  (5) referral to genetics for evaluation of possible Cowden's syndrome is currently on hold  PLAN: Stacey Roberts is very anxious and depressed. Though there are some positives in her life--she has a steady job, after all, and her significant other is currently working, even though he is making much less than before--things are out of control, she is not exercising, she is eating the wrong diet, she can't sleep, and basically she cannot shake off distress.  I do not think it is going to be possible for [place] to start anti-estrogens, with their multiple possible symptoms, until Stacey Roberts is able to get things in some order in her life and with the rest of her health. Accordingly I gave her a [employer], suggesting she joined one of the [employer] groups and start exercising they her, and if she cannot manage had been at the very least to walk 20 minutes in the morning and 20 minutes in the evening or 40 minutes either time on a daily basis.  She knows exactly what kind of  died she should be eating for diabetes and she needs to institute that. I think if she may dosed to moves she would start feeling a little bit less stress and more in control.  Her significant other is also under a great deal of stress. I suggested counseling either individually or for the couple, but they are "watching her up any" and they cannot afford that at present. For the same reason she does not feel genetics counseling is a possibility right now.  We did discuss anti-estrogens, and because she wants to keep her Morana at IUD the choice will be for  tamoxifen. She has a good understanding of the possible toxicities, side effects and complications of this agent including the risk of blood clots. She will call me in approximately 6 weeks, and if she feels things are more under control at that time and she is ready to get started, we will place the prescription in at that time.  Stacey Roberts is a good understanding of this plan. She agrees with that. She will call with any problems that may develop before her next visit here.   Chauncey Cruel, MD   07/30/2013 3:59 PM

## 2013-07-30 NOTE — Telephone Encounter (Signed)
, °

## 2013-08-05 ENCOUNTER — Ambulatory Visit (INDEPENDENT_AMBULATORY_CARE_PROVIDER_SITE_OTHER): Payer: 59 | Admitting: Family Medicine

## 2013-08-05 VITALS — BP 118/88 | Wt 248.0 lb

## 2013-08-05 DIAGNOSIS — E119 Type 2 diabetes mellitus without complications: Secondary | ICD-10-CM

## 2013-08-07 NOTE — Progress Notes (Signed)
Patient presents today for 3 month diabetes follow-up as part of the employer-sponsored Link to Wellness program. Current diabetes regimen includes Metformin,Invokana, and Januvia. Patient also continues on daily ASA and ARB. Most recent MD follow-up was March 2015 with Dr. Loanne Drilling. She will follow-up Sept 2015. Of note, patient was diagnosed with DCIS (ductal carcinoma in situ) and has underwent two surgeries at this time. She does not plan to pursue further treatment at this time and will continue being followed by MD, Dr. Jana Hakim, oncologist. No other med changes at this time or major health changes.  Diabetes Assessment: Type of Diabetes: Type 2; Sees Diabetes provider 2 times per year; checks feet daily; hypoglycemia frequency None reported; uses glucometer; takes medications as prescribed; takes an aspirin a day; MD managing Diabetes Renato Shin, Endo; Highest CBG >200; checks blood glucose less than 1 time a week; Other Diabetes History: Current med regimen includes Metformin 1000 mg twice daily, Invokana 300 mg daily, and Januvia 100 mg daily. Patient has also been prescribed pioglitazone 45 mg daily, but this remains on hold due to cost. Patient is financially strained and does not wish to pay the copay at this time and this med is not offered at zero in the Link to Wellness program. Patient maintains good medication compliance with all meds except for Benicar, which she takes only 2-3 times per week. I have encouraged her to be more diligent with this. Patient did not bring meter today but is currently testing less than once weekly, she is aware of the need for more frequent testing. Pt reports hyperglycemia with fasting glucose usually upper 100s to >200. Hypoglycemia frequency is rare. Patient does demonstrate appropriate correction of hypoglycemia. Patient denies signs and symptoms of neuropathy including numbness/tingling/burning and symptoms of foot infection. Patient is not due for yearly eye  exam, and had exam yesterday which was negative for diabetic retinopathy.  Lifestyle Factors: Diet - Diet continues to deteriorate due to stress. Patient continues struggling with DCIS diagnosis. Also, her boyfriend continues without a job and this has caused emotional and financial stress for both of them. Patient struggles with stress eating and admits that her diet is largely uncontrolled. They are eating out less now due to finances, but in turn are eating more unhealthy foods in order to save money. Example, they will eat pizza 3 nights in a row in order to make one meal stretch. Patient continues to be very tearful and anxious regarding her diet. She is aware of the changes that need to be made and I have encouraged to her focus on very small and acheivable goals at first as she tries to regain control of her diet.  Exercise - No exercise at this time. Patient has not tried [facility] fitness facility. She was given a pamphlet by Dr. Jana Hakim (oncologitst) for a 12 week physcial activity program called [employer] at [facility]. This is a free program specially designed for those with cancer and patient and her boyfriend will both qualify. She seems positive about the possibility of joining and I have encouraged her to do so.   Assessment: Patient has continues to allow lifestyle and DM control to deteriorate over the past 3 months, largely due to emotional stress. At this visit A1c has increased from 7.7 to 8.4. Patient was disappointed by this. She remains aware of her need to improve both diet and exercise and will work on small goals over the next several weeks. She will follow-up with MD in Sept. I will  follow-up in 3 months.  Plan: 1) Continue to work toward a healthy diet and attempt to make healthy choices 2) Investigate and consider enrolling in [facility] program 3) Resume testing at least 2-3 times per week 4) Look into new endrocrinologist 5) Return for follow-up in 3 months on Tuesday October  13th @ 11:00 am

## 2013-08-28 ENCOUNTER — Ambulatory Visit (INDEPENDENT_AMBULATORY_CARE_PROVIDER_SITE_OTHER): Payer: Commercial Managed Care - PPO | Admitting: Surgery

## 2013-09-09 ENCOUNTER — Encounter: Payer: Self-pay | Admitting: Family Medicine

## 2013-09-09 ENCOUNTER — Ambulatory Visit (INDEPENDENT_AMBULATORY_CARE_PROVIDER_SITE_OTHER): Payer: 59 | Admitting: Internal Medicine

## 2013-09-09 ENCOUNTER — Encounter: Payer: Self-pay | Admitting: Internal Medicine

## 2013-09-09 VITALS — BP 124/82 | HR 88 | Temp 98.1°F | Ht 63.75 in | Wt 251.0 lb

## 2013-09-09 DIAGNOSIS — I10 Essential (primary) hypertension: Secondary | ICD-10-CM

## 2013-09-09 DIAGNOSIS — F32A Depression, unspecified: Secondary | ICD-10-CM

## 2013-09-09 DIAGNOSIS — E1165 Type 2 diabetes mellitus with hyperglycemia: Secondary | ICD-10-CM

## 2013-09-09 DIAGNOSIS — E785 Hyperlipidemia, unspecified: Secondary | ICD-10-CM

## 2013-09-09 DIAGNOSIS — C50119 Malignant neoplasm of central portion of unspecified female breast: Secondary | ICD-10-CM

## 2013-09-09 DIAGNOSIS — IMO0001 Reserved for inherently not codable concepts without codable children: Secondary | ICD-10-CM

## 2013-09-09 DIAGNOSIS — F341 Dysthymic disorder: Secondary | ICD-10-CM

## 2013-09-09 DIAGNOSIS — C50112 Malignant neoplasm of central portion of left female breast: Secondary | ICD-10-CM

## 2013-09-09 DIAGNOSIS — M25519 Pain in unspecified shoulder: Secondary | ICD-10-CM

## 2013-09-09 DIAGNOSIS — J309 Allergic rhinitis, unspecified: Secondary | ICD-10-CM

## 2013-09-09 DIAGNOSIS — F419 Anxiety disorder, unspecified: Principal | ICD-10-CM

## 2013-09-09 DIAGNOSIS — F329 Major depressive disorder, single episode, unspecified: Secondary | ICD-10-CM | POA: Insufficient documentation

## 2013-09-09 DIAGNOSIS — E890 Postprocedural hypothyroidism: Secondary | ICD-10-CM

## 2013-09-09 DIAGNOSIS — M25511 Pain in right shoulder: Secondary | ICD-10-CM

## 2013-09-09 MED ORDER — OLMESARTAN MEDOXOMIL 20 MG PO TABS: 20.0000 mg | ORAL_TABLET | Freq: Every day | ORAL | Status: DC

## 2013-09-09 MED ORDER — SERTRALINE HCL 50 MG PO TABS: 50.0000 mg | ORAL_TABLET | Freq: Every day | ORAL | Status: DC

## 2013-09-09 MED ORDER — OXYCODONE-ACETAMINOPHEN 5-325 MG PO TABS: 1.0000 | ORAL_TABLET | ORAL | Status: DC | PRN

## 2013-09-09 MED ORDER — ALPRAZOLAM 0.5 MG PO TABS: 0.5000 mg | ORAL_TABLET | Freq: Two times a day (BID) | ORAL | Status: DC | PRN

## 2013-09-09 NOTE — Assessment & Plan Note (Signed)
Follows with Dr. Griffith Citron

## 2013-09-09 NOTE — Assessment & Plan Note (Signed)
[name] reviewed She is on Metformin, Celesta Gentile and Invokana She follows with Dr. Loanne Drilling

## 2013-09-09 NOTE — Progress Notes (Signed)
Pre visit review using our clinic review tool, if applicable. No additional management support is needed unless otherwise documented below in the visit note. 

## 2013-09-09 NOTE — Assessment & Plan Note (Signed)
On Synthroid

## 2013-09-09 NOTE — Patient Instructions (Addendum)
Generalized Anxiety Disorder Generalized anxiety disorder (GAD) is a mental disorder. It interferes with life functions, including relationships, work, and school. GAD is different from normal anxiety, which everyone experiences at some point in their lives in response to specific life events and activities. Normal anxiety actually helps us prepare for and get through these life events and activities. Normal anxiety goes away after the event or activity is over.  GAD causes anxiety that is not necessarily related to specific events or activities. It also causes excess anxiety in proportion to specific events or activities. The anxiety associated with GAD is also difficult to control. GAD can vary from mild to severe. People with severe GAD can have intense waves of anxiety with physical symptoms (panic attacks).  SYMPTOMS The anxiety and worry associated with GAD are difficult to control. This anxiety and worry are related to many life events and activities and also occur more days than not for 6 months or longer. People with GAD also have three or more of the following symptoms (one or more in children):  Restlessness.   Fatigue.  Difficulty concentrating.   Irritability.  Muscle tension.  Difficulty sleeping or unsatisfying sleep. DIAGNOSIS GAD is diagnosed through an assessment by your health care provider. Your health care provider will ask you questions aboutyour mood,physical symptoms, and events in your life. Your health care provider may ask you about your medical history and use of alcohol or drugs, including prescription medicines. Your health care provider may also do a physical exam and blood tests. Certain medical conditions and the use of certain substances can cause symptoms similar to those associated with GAD. Your health care provider may refer you to a mental health specialist for further evaluation. TREATMENT The following therapies are usually used to treat GAD:    Medication. Antidepressant medication usually is prescribed for long-term daily control. Antianxiety medicines may be added in severe cases, especially when panic attacks occur.   Talk therapy (psychotherapy). Certain types of talk therapy can be helpful in treating GAD by providing support, education, and guidance. A form of talk therapy called cognitive behavioral therapy can teach you healthy ways to think about and react to daily life events and activities.  Stress managementtechniques. These include yoga, meditation, and exercise and can be very helpful when they are practiced regularly. A mental health specialist can help determine which treatment is best for you. Some people see improvement with one therapy. However, other people require a combination of therapies. Document Released: 05/06/2012 Document Revised: 05/26/2013 Document Reviewed: 05/06/2012 ExitCare Patient Information 2015 [employer]. This information is not intended to replace advice given to you by your health care provider. Make sure you discuss any questions you have with your health care provider.  

## 2013-09-09 NOTE — Assessment & Plan Note (Signed)
Will start zoloft with supplemental xanax for severe anxiety only

## 2013-09-09 NOTE — Progress Notes (Signed)
Patient ID: Stacey Roberts, female   DOB: Sep 14, 1965, 48 y.o.   MRN: [id number] Reviewed: Agree with the documentation and management of our pharmacologist.

## 2013-09-09 NOTE — Progress Notes (Signed)
HPI  Pt presents to the clinic today to establish care. She is transferring care from Dr. Shawna Orleans.  Flu: 2014 Tetanus: 2007 LMP: IUD Pap Smear: 2013- normal Mammogram: 07/2013 Eye Doctor: yearly Dentist: as needed  Chronic right shoulder pain, She was told it was bursitis. She has had an injection by orthopedics in the past. She is not taking anything for pain because the OTC medications does not work. Tramadol and Flexeril do not work. Percocet does work but she is out of the medication now. She reports that she is unable to go back to the orthopedics to have another cortisone injection due to financial strains.  DM2: [name] 8.2%. On metformin, januvia, and invokana. She is followed by Dr. Loanne Drilling.  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 Griffith Citron - oncologist. She is not undergoing treatment at this time.  Anxiety: She is having anxiety and stress every day. She reports she has never been treated for this in the past. She reports she is under a lot of financial stress. She was recently diagnosed with cancer. She feels like she needs something to help her control the anxiety.   Past Medical History  Diagnosis Date  . Diabetes mellitus, type II   . Obesity   . Hypertension   . Hyperlipidemia   . 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 Buddy Duty  . Bronchitis     hx of  . Hypothyroidism   . Snoring   . Sleep apnea 2008    severe OSA-does not use a cpap  . GERD (gastroesophageal reflux disease)     Current Outpatient Prescriptions  Medication Sig Dispense Refill  . aspirin 81 MG tablet Take 81 mg by mouth daily.        . Canagliflozin (INVOKANA) 300 MG TABS Take 300 mg by mouth daily.      Marland Kitchen glucose blood (ONE TOUCH TEST STRIPS) test strip Use to check blood sugar three times a day  100 each  3  . levothyroxine (SYNTHROID, LEVOTHROID) 150 MCG tablet Take 1 tablet  (150 mcg total) by mouth daily before breakfast.  30 tablet  2  . metFORMIN (GLUCOPHAGE) 1000 MG tablet Take 1 tablet (1,000 mg total) by mouth 2 (two) times daily with a meal.  180 tablet  1  . Multiple Vitamin (MULTIVITAMIN WITH MINERALS) TABS tablet Take 1 tablet by mouth daily.      Marland Kitchen olmesartan (BENICAR) 20 MG tablet Take 1 tablet (20 mg total) by mouth daily.  30 tablet  1  . ONE TOUCH LANCETS MISC Use to check blood sugar three time a day       . pantoprazole (PROTONIX) 40 MG tablet TAKE 1 TABLET BY MOUTH DAILY.  90 tablet  3  . sitaGLIPtin (JANUVIA) 100 MG tablet Take 1 tablet (100 mg total) by mouth daily.  90 tablet  3  . [DISCONTINUED] Calcium Carbonate 1500 MG TABS Take 1,500 mg by mouth daily.         No current facility-administered medications for this visit.    Allergies  Allergen Reactions  . Penicillins Shortness Of Breath and Rash  . Ace Inhibitors     REACTION: cough    Family History  Problem Relation Age of Onset  . Dementia Father     deceased age 13 secondary to dementia  . Bipolar disorder Father   . Emphysema Father   .  Heart disease Father   . CAD Mother 37    Died age 97 of CAD  . Heart disease Mother   . CAD Maternal Grandfather 53    History   Social History  . Marital Status: Divorced    Spouse Name: N/A    Number of Children: N/A  . Years of Education: N/A   Occupational History  .  [employer]    Outpatient scheduling in radiology   Social History Main Topics  . Smoking status: Former Smoker    Types: Cigarettes  . Smokeless tobacco: Never Used     Comment: Smoked on and off for 20 years. Quit about 5 years ago.  . Alcohol Use: No  . Drug Use: No  . Sexual Activity: Not on file   Other Topics Concern  . Not on file   Social History Narrative   The patient is divorced, moved to [place] in   2006 from [place].  She does not have any children, currently lives   with her boyfriend and is working as a Information systems manager for [employer].    No alcohol.  Tobacco use:  She quit 5 years ago.  She smoked on   and off for approximately 20 years.  No history of recreational drug   Use.Drinks two cups of coffee per work day.     ROS:  Constitutional: Denies fever, malaise, fatigue, headache or abrupt weight changes.  Respiratory: Denies difficulty breathing, shortness of breath, cough or sputum production.   Cardiovascular: Denies chest pain, chest tightness, palpitations or swelling in the hands or feet.  Musculoskeletal: Pt reports right shoulder pain. Denies decrease in range of motion, difficulty with gait, muscle pain.  Psych: Pt reports anxiety and depression. Denies SI/HI.  No other specific complaints in a complete review of systems (except as listed in HPI above).  PE:  BP 124/82  Pulse 88  Temp(Src) 98.1 F (36.7 C) (Oral)  Ht 5' 3.75" (1.619 m)  Wt 251 lb (113.853 kg)  BMI 43.44 kg/m2  SpO2 98% Wt Readings from Last 3 Encounters:  09/09/13 251 lb (113.853 kg)  08/07/13 248 lb (112.492 kg)  07/30/13 253 lb 1.6 oz (114.805 kg)    General: Appears her stated age, obese but well developed, well nourished in NAD. Cardiovascular: Normal rate and rhythm. S1,S2 noted.  No murmur, rubs or gallops noted. No JVD or BLE edema. No carotid bruits noted. Pulmonary/Chest: Normal effort and positive vesicular breath sounds. No respiratory distress. No wheezes, rales or ronchi noted.  Musculoskeletal: Normal internal and external rotation of the right shoulder. Pain with palpation of the subacromial bursa. Psychiatric: Mood tearful and affect normal. Behavior is normal. Judgment and thought content normal.     BMET    Component Value Date/Time   NA 137 02/18/2013 1554   NA 134* 01/03/2013 0915   K 4.4 02/18/2013 1554   K 4.7 01/03/2013 0915   CL 97 01/03/2013 0915   CO2 25 02/18/2013 1554   CO2 24 01/03/2013 0915   GLUCOSE 300* 02/18/2013 1554   GLUCOSE 308* 01/03/2013 0915   BUN 14.2  02/18/2013 1554   BUN 11 01/03/2013 0915   CREATININE 0.8 02/18/2013 1554   CREATININE 0.51 01/03/2013 0915   CALCIUM 9.2 02/18/2013 1554   CALCIUM 9.1 01/03/2013 0915   GFRNONAA >90 01/03/2013 0915   GFRAA >90 01/03/2013 0915    Lipid Panel     Component Value Date/Time   CHOL 216* 11/28/2012  1010   TRIG 221.0* 11/28/2012 1010   HDL 45.90 11/28/2012 1010   CHOLHDL 5 11/28/2012 1010   VLDL 44.2* 11/28/2012 1010   LDLCALC 70 11/15/2011 0946    CBC    Component Value Date/Time   WBC 9.4 02/18/2013 1554   WBC 8.6 01/03/2013 0915   RBC 4.79 02/18/2013 1554   RBC 4.95 01/03/2013 0915   HGB 12.5 02/18/2013 1554   HGB 13.0 01/03/2013 0915   HCT 39.6 02/18/2013 1554   HCT 40.0 01/03/2013 0915   PLT 306 02/18/2013 1554   PLT 292 01/03/2013 0915   MCV 82.6 02/18/2013 1554   MCV 80.8 01/03/2013 0915   MCH 26.1 02/18/2013 1554   MCH 26.3 01/03/2013 0915   MCHC 31.7 02/18/2013 1554   MCHC 32.5 01/03/2013 0915   RDW 16.4* 02/18/2013 1554   RDW 15.2 01/03/2013 0915   LYMPHSABS 2.0 02/18/2013 1554   LYMPHSABS 2.3 04/04/2011 1241   MONOABS 0.6 02/18/2013 1554   MONOABS 0.5 04/04/2011 1241   EOSABS 0.2 02/18/2013 1554   EOSABS 0.4 04/04/2011 1241   BASOSABS 0.0 02/18/2013 1554   BASOSABS 0.1 04/04/2011 1241    Hgb A1C Lab Results  Component Value Date   HGBA1C 8.2* 04/21/2013     Assessment and Plan:  Right shoulder pain:  RX give for percocet for severe pain only

## 2013-09-09 NOTE — Assessment & Plan Note (Signed)
Not currently an issue

## 2013-09-09 NOTE — Assessment & Plan Note (Signed)
Well controlled on [place] reviewed Medication refilled today  RTC in 6 months

## 2013-09-09 NOTE — Assessment & Plan Note (Signed)
Diet controlled.  

## 2013-09-10 ENCOUNTER — Telehealth: Payer: Self-pay | Admitting: Internal Medicine

## 2013-09-10 NOTE — Telephone Encounter (Signed)
Relevant patient education assigned to patient using Emmi. ° °

## 2013-09-25 ENCOUNTER — Encounter: Payer: Self-pay | Admitting: Family Medicine

## 2013-09-25 NOTE — Progress Notes (Signed)
Patient ID: Stacey Roberts, female   DOB: Sep 06, 1965, 48 y.o.   MRN: [id number] Reviewed: Agree with the documentation and management of our [place].

## 2013-10-15 ENCOUNTER — Ambulatory Visit (INDEPENDENT_AMBULATORY_CARE_PROVIDER_SITE_OTHER): Payer: 59

## 2013-10-15 ENCOUNTER — Ambulatory Visit (INDEPENDENT_AMBULATORY_CARE_PROVIDER_SITE_OTHER): Payer: 59 | Admitting: Podiatry

## 2013-10-15 ENCOUNTER — Encounter: Payer: Self-pay | Admitting: Podiatry

## 2013-10-15 VITALS — BP 99/64 | HR 92 | Resp 16 | Ht 64.0 in | Wt 250.0 lb

## 2013-10-15 DIAGNOSIS — E119 Type 2 diabetes mellitus without complications: Secondary | ICD-10-CM

## 2013-10-15 DIAGNOSIS — E1140 Type 2 diabetes mellitus with diabetic neuropathy, unspecified: Secondary | ICD-10-CM

## 2013-10-15 DIAGNOSIS — E1142 Type 2 diabetes mellitus with diabetic polyneuropathy: Secondary | ICD-10-CM

## 2013-10-15 MED ORDER — PREGABALIN 50 MG PO CAPS: 50.0000 mg | ORAL_CAPSULE | Freq: Three times a day (TID) | ORAL | Status: DC

## 2013-10-15 NOTE — Progress Notes (Signed)
   Subjective:    Patient ID: Stacey Roberts, female    DOB: 1965/03/16, 48 y.o.   MRN: [id number]  HPI Comments: The bottom of both feet burn. It makes my feet uncomfortable, no pain. This has been going on for 1 month. It gets better and then it gets worse. My sneakers seem to bother me. When im barefoot in my chair at home my feet burn. i have a cooling foot cream i put on and that's it.  Foot Pain      Review of Systems  All other systems reviewed and are negative.      Objective:   Physical Exam: I have reviewed her past medical history meds allergies surgeries social history review of systems. Currently her hemoglobin A1c is a 9.5. Pulses are strongly palpable. Neurologic sensorium is slightly decreased to the tuft of the toes per since once the monofilament a normal for the remainder the foot. Deep tendon reflexes are intact muscle strength is 5 over 5 dorsiflexors plantar flexors inverters everters all intrinsic musculature appears to be intact. Orthopedic evaluation demonstrates mild flexible hammertoe deformities are asymptomatic. Cutaneous evaluation Mr. is supple while hydrated cutis mild xerosis plantarly only but the remainder appears to be well hydrated.        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  Plan: Started her on Lyrica 50 mg morning and evening. I will followup with her in one month. She will call us with any complications of this medication.

## 2013-10-16 ENCOUNTER — Ambulatory Visit: Payer: Self-pay | Admitting: Podiatry

## 2013-10-16 ENCOUNTER — Telehealth: Payer: Self-pay | Admitting: *Deleted

## 2013-10-16 NOTE — Telephone Encounter (Signed)
[facility] called wanting to know if the rx for lyrica should be twice daily or three times daily. rx was written for 3 times daily. Per dr Charlesetta Ivory should be twice daily. Pharmacy understood.

## 2013-10-17 ENCOUNTER — Encounter: Payer: Self-pay | Admitting: Oncology

## 2013-10-17 ENCOUNTER — Other Ambulatory Visit: Payer: Self-pay

## 2013-10-17 MED ORDER — LEVOTHYROXINE SODIUM 150 MCG PO TABS: 150.0000 ug | ORAL_TABLET | Freq: Every day | ORAL | Status: DC

## 2013-10-17 NOTE — Telephone Encounter (Addendum)
Printed for Dr. Jana Hakim review. 10-21-2013 See today's phone note by collaborative nurse.

## 2013-10-21 ENCOUNTER — Telehealth: Payer: Self-pay | Admitting: *Deleted

## 2013-10-21 ENCOUNTER — Encounter: Payer: Self-pay | Admitting: Internal Medicine

## 2013-10-21 NOTE — Telephone Encounter (Signed)
This RN spoke with pt per her email inquiry regarding starting the tamoxifen and having a mirena - " and should I have a hysterectomy ".  Per discussion - Stacey Roberts clarified further by stating concern is related to known endometrial hyperplasia and possible endometrial changes from tamoxifen.  This RN informed pt that medically recommendation is for her to start the tamoxifen-no known contraindication related to mirena- but she should see her gyn and discuss concerns including obtaining a hysterectomy as well as due to history of breast cancer that is ER/PR + removal of her ovaries.   Overall Stacey Roberts is reluctant to start the tamoxifen- and will schedule an appointment with gyn to discuss her concerns.  " I know my case is noninvasive and and the last thing I would want is to end up with endometrial cancer "  No further questions at this time.

## 2013-10-22 ENCOUNTER — Ambulatory Visit: Payer: Self-pay | Admitting: Endocrinology

## 2013-10-24 ENCOUNTER — Ambulatory Visit (INDEPENDENT_AMBULATORY_CARE_PROVIDER_SITE_OTHER): Payer: 59 | Admitting: Endocrinology

## 2013-10-24 ENCOUNTER — Encounter: Payer: Self-pay | Admitting: Endocrinology

## 2013-10-24 VITALS — BP 118/78 | HR 90 | Temp 98.4°F | Ht 64.0 in | Wt 250.0 lb

## 2013-10-24 DIAGNOSIS — E1142 Type 2 diabetes mellitus with diabetic polyneuropathy: Secondary | ICD-10-CM

## 2013-10-24 DIAGNOSIS — E890 Postprocedural hypothyroidism: Secondary | ICD-10-CM

## 2013-10-24 DIAGNOSIS — C73 Malignant neoplasm of thyroid gland: Secondary | ICD-10-CM

## 2013-10-24 NOTE — Progress Notes (Signed)
Subjective:    Patient ID: Stacey Roberts, female    DOB: 1965-08-15, 48 y.o.   MRN: [id number]  HPI Pt returns for f/u of papillary adenocarcinoma of the thyroid.    2/11: thyroidectomy: T1b N0 M0.  4/11: i-131 103 mci, with thyrogen.  12/11: neck US: single enlarged right cervical lymph node, nonspecific.   6/12: body scan (thyrogen) neg.   12/12: neck US: lymph node is stable 6/13: overall node morphology and volume show very little change  9/14  TG undetectable (ab neg) 3/15: TG undetectable (ab neg) She does not notice any lump in the neck.   Pt also returns for f/u of diabetes mellitus: DM type: 2 Dx'ed: 8563 Complications: painful neuropathy of the lower extremities Therapy: 3 orals GDM: never DKA: never Severe hypoglycemia: never Pancreatitis: never Other: she has never taken insulin; she declines weight-loss surgery Interval history: no cbg record, but states cbg's are in the high-100's.   Past Medical History  Diagnosis Date  . Diabetes mellitus, type II   . Obesity   . Hypertension   . Hyperlipidemia   . 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 Buddy Duty  . Bronchitis     hx of  . Hypothyroidism   . Snoring   . Sleep apnea 2008    severe OSA-does not use a cpap  . GERD (gastroesophageal reflux disease)     Past Surgical History  Procedure Laterality Date  . Tonsillectomy  1982  . Dilation and curettage of uterus  2004    s/p  . Thyroidectomy  02/2009    Dr Harlow Asa  . Breast biopsy  2000    s/p  . Breast lumpectomy Left 01/06/2013    Procedure: LUMPECTOMY;  Surgeon: Harl Bowie, MD;  Location: [place];  Service: General;  Laterality: Left;  . Breast biopsy Left 01/22/2013    Procedure: RE-EXCISION LEFT BREAST DCIS;  Surgeon: Harl Bowie, MD;  Location: [place];  Service: General;  Laterality: Left;    History   Social  History  . Marital Status: Divorced    Spouse Name: N/A    Number of Children: N/A  . Years of Education: N/A   Occupational History  .  [employer]    Outpatient scheduling in radiology   Social History Main Topics  . Smoking status: Former Smoker    Types: Cigarettes  . Smokeless tobacco: Never Used     Comment: Smoked on and off for 20 years. Quit about 5 years ago.  . Alcohol Use: Yes  . Drug Use: No  . Sexual Activity: Yes   Other Topics Concern  . Not on file   Social History Narrative   The patient is divorced, moved to [place] in   2006 from [place].  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    No alcohol.  Tobacco use:  She quit 5 years ago.  She smoked on   and off for approximately 20 years.  No history of recreational drug   Use.Drinks two cups of coffee per work day.     Current Outpatient Prescriptions on File Prior to Visit  Medication Sig Dispense Refill  . ALPRAZolam (XANAX) 0.5 MG tablet Take 1 tablet (0.5 mg total) by mouth 2 (two) times daily as needed  for anxiety.  30 tablet  0  . aspirin 81 MG tablet Take 81 mg by mouth daily.        . Canagliflozin (INVOKANA) 300 MG TABS Take 300 mg by mouth daily.      Marland Kitchen glucose blood (ONE TOUCH TEST STRIPS) test strip Use to check blood sugar three times a day  100 each  3  . levothyroxine (SYNTHROID, LEVOTHROID) 150 MCG tablet Take 1 tablet (150 mcg total) by mouth daily before breakfast.  30 tablet  2  . metFORMIN (GLUCOPHAGE) 1000 MG tablet Take 1 tablet (1,000 mg total) by mouth 2 (two) times daily with a meal.  180 tablet  1  . Multiple Vitamin (MULTIVITAMIN WITH MINERALS) TABS tablet Take 1 tablet by mouth daily.      Marland Kitchen olmesartan (BENICAR) 20 MG tablet Take 1 tablet (20 mg total) by mouth daily.  30 tablet  5  . ONE TOUCH LANCETS MISC Use to check blood sugar three time a day       . oxyCODONE-acetaminophen  (ROXICET) 5-325 MG per tablet Take 1-2 tablets by mouth every 4 (four) hours as needed for severe pain.  40 tablet  0  . pantoprazole (PROTONIX) 40 MG tablet TAKE 1 TABLET BY MOUTH DAILY.  90 tablet  3  . pregabalin (LYRICA) 50 MG capsule Take 1 capsule (50 mg total) by mouth 3 (three) times daily.  60 capsule  11  . sertraline (ZOLOFT) 50 MG tablet Take 1 tablet (50 mg total) by mouth daily.  30 tablet  3  . sitaGLIPtin (JANUVIA) 100 MG tablet Take 1 tablet (100 mg total) by mouth daily.  90 tablet  3  . [DISCONTINUED] Calcium Carbonate 1500 MG TABS Take 1,500 mg by mouth daily.         No current facility-administered medications on file prior to visit.    Allergies  Allergen Reactions  . Penicillins Shortness Of Breath and Rash  . Ace Inhibitors     REACTION: cough    Family History  Problem Relation Age of Onset  . Dementia Father     deceased age 37 secondary to dementia  . Bipolar disorder Father   . Emphysema Father   . Heart disease Father   . CAD Mother 80    Died age 52 of CAD  . Heart disease Mother   . CAD Maternal Grandfather 60    BP 118/78  Pulse 90  Temp(Src) 98.4 F (36.9 C) (Oral)  Ht 5\' 4"  (1.626 m)  Wt 250 lb (113.399 kg)  BMI 42.89 kg/m2  SpO2 96%  Review of Systems She denies hypoglycemia and weight change    Objective:   Physical Exam VITAL SIGNS:  See vs page GENERAL: no distress Neck: a healed scar is present.  i do not appreciate a nodule in the thyroid or elsewhere in the neck Pulses: dorsalis pedis intact bilat.   Feet: no deformity.  no edema. Skin:  no ulcer on the feet.  normal color and temp. Neuro: sensation is intact to touch on the feet.     Assessment & Plan:  DM: severe exacerbation. 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   Patient is advised the following: Patient Instructions  blood tests are being requested for you today.  We'll contact you with  results.  check your blood sugar once a day.  vary the time of day when you check, between before the 3 meals,  and at bedtime.  also check if you have symptoms of your blood sugar being too high or too low.  please keep a record of the readings and bring it to your next appointment here.  You can write it on any piece of paper.  please call us sooner if your blood sugar goes below 70, or if you have a lot of readings over 200.  Please come back for a follow-up appointment in January.

## 2013-10-24 NOTE — Patient Instructions (Addendum)
blood tests are being requested for you today.  We'll contact you with results.  check your blood sugar once a day.  vary the time of day when you check, between before the 3 meals, and at bedtime.  also check if you have symptoms of your blood sugar being too high or too low.  please keep a record of the readings and bring it to your next appointment here.  You can write it on any piece of paper.  please call us sooner if your blood sugar goes below 70, or if you have a lot of readings over 200.  Please come back for a follow-up appointment in January.

## 2013-10-27 ENCOUNTER — Encounter: Payer: Self-pay | Admitting: Internal Medicine

## 2013-10-28 ENCOUNTER — Emergency Department (HOSPITAL_COMMUNITY)
Admission: EM | Admit: 2013-10-28 | Discharge: 2013-10-28 | Disposition: A | Discharge status: Home / Self Care | Payer: 59 | Source: Home / Self Care

## 2013-10-28 ENCOUNTER — Other Ambulatory Visit: Payer: Self-pay | Admitting: Endocrinology

## 2013-10-28 ENCOUNTER — Encounter (HOSPITAL_COMMUNITY): Payer: Self-pay | Admitting: Emergency Medicine

## 2013-10-28 DIAGNOSIS — M778 Other enthesopathies, not elsewhere classified: Secondary | ICD-10-CM

## 2013-10-28 LAB — HEMOGLOBIN A1C
Hgb A1c MFr Bld: 8.6 % — ABNORMAL HIGH (ref <5.7)
MEAN PLASMA GLUCOSE: 200 mg/dL — AB (ref <117)

## 2013-10-28 LAB — THYROGLOBULIN LEVEL: Thyroglobulin: <0.1 ng/mL — ABNORMAL LOW (ref 2.8–40.9)

## 2013-10-28 LAB — THYROGLOBULIN ANTIBODY: Thyroglobulin Ab: <1 IU/mL (ref <2)

## 2013-10-28 LAB — TSH: TSH: 1.074 u[IU]/mL (ref 0.350–4.500)

## 2013-10-28 MED ORDER — MELOXICAM 15 MG PO TABS: 15.0000 mg | ORAL_TABLET | Freq: Every day | ORAL | Status: DC

## 2013-10-28 MED ORDER — GLIMEPIRIDE 1 MG PO TABS: 1.0000 mg | ORAL_TABLET | Freq: Every day | ORAL | Status: DC

## 2013-10-28 MED ORDER — TRAMADOL HCL 50 MG PO TABS: ORAL_TABLET | ORAL | Status: DC

## 2013-10-28 NOTE — ED Provider Notes (Signed)
CSN: [id number]     Arrival date & time 10/28/13  3295 History   First MD Initiated Contact with Patient 10/28/13 [phone]     Chief Complaint  Patient presents with  . Wrist Pain   (Consider location/radiation/quality/duration/timing/severity/associated sxs/prior Treatment) HPI Comments: C/o pain in right wrist for 3 d. Works at a computer station all day. Decrease wrist movement due to pain and weakness; weak grip. Pain radiates up forearm. No hx of trauma, fall, etc   Past Medical History  Diagnosis Date  . Diabetes mellitus, type II   . Obesity   . Hypertension   . Hyperlipidemia   . 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 Buddy Duty  . Bronchitis     hx of  . Hypothyroidism   . Snoring   . Sleep apnea 2008    severe OSA-does not use a cpap  . GERD (gastroesophageal reflux disease)    Past Surgical History  Procedure Laterality Date  . Tonsillectomy  1982  . Dilation and curettage of uterus  2004    s/p  . Thyroidectomy  02/2009    Dr Harlow Asa  . Breast biopsy  2000    s/p  . Breast lumpectomy Left 01/06/2013    Procedure: LUMPECTOMY;  Surgeon: Harl Bowie, MD;  Location: [place];  Service: General;  Laterality: Left;  . Breast biopsy Left 01/22/2013    Procedure: RE-EXCISION LEFT BREAST DCIS;  Surgeon: Harl Bowie, MD;  Location: [place];  Service: General;  Laterality: Left;   Family History  Problem Relation Age of Onset  . Dementia Father     deceased age 85 secondary to dementia  . Bipolar disorder Father   . Emphysema Father   . Heart disease Father   . CAD Mother 78    Died age 20 of CAD  . Heart disease Mother   . CAD Maternal Grandfather 60   History  Substance Use Topics  . Smoking status: Former Smoker    Types: Cigarettes  . Smokeless tobacco: Never Used     Comment: Smoked on and off for 20 years. Quit about 5 years ago.   . Alcohol Use: Yes   OB History   Grav Para Term Preterm Abortions TAB SAB Ect Mult Living                 Review of Systems  Constitutional: Negative for fever, chills and activity change.  HENT: Negative.   Respiratory: Negative.   Cardiovascular: Negative.   Musculoskeletal: Negative for joint swelling and neck pain.       As per HPI  Skin: Negative for color change, pallor and rash.  Neurological: Negative.     Allergies  Penicillins and Ace inhibitors  Home Medications   Prior to Admission medications   Medication Sig Start Date End Date Taking? Authorizing Provider  ALPRAZolam Duanne Moron) 0.5 MG tablet Take 1 tablet (0.5 mg total) by mouth 2 (two) times daily as needed for anxiety. 09/09/13   Jearld Fenton, NP  aspirin 81 MG tablet Take 81 mg by mouth daily.      Historical Provider, MD  Canagliflozin (INVOKANA) 300 MG TABS Take 300 mg by mouth daily.    Historical Provider, MD  glucose blood (ONE TOUCH TEST STRIPS) test strip Use to check blood sugar three times a day 05/10/11   Doe-Hyun R Shawna Orleans, DO  levothyroxine (SYNTHROID,  LEVOTHROID) 150 MCG tablet Take 1 tablet (150 mcg total) by mouth daily before breakfast. 10/17/13   Renato Shin, MD  meloxicam (MOBIC) 15 MG tablet Take 1 tablet (15 mg total) by mouth daily. 10/28/13   Janne Napoleon, NP  metFORMIN (GLUCOPHAGE) 1000 MG tablet Take 1 tablet (1,000 mg total) by mouth 2 (two) times daily with a meal. 06/26/13   Renato Shin, MD  Multiple Vitamin (MULTIVITAMIN WITH MINERALS) TABS tablet Take 1 tablet by mouth daily.    Historical Provider, MD  olmesartan (BENICAR) 20 MG tablet Take 1 tablet (20 mg total) by mouth daily. 09/09/13   Jearld Fenton, NP  ONE TOUCH LANCETS MISC Use to check blood sugar three time a day     Historical Provider, MD  oxyCODONE-acetaminophen (ROXICET) 5-325 MG per tablet Take 1-2 tablets by mouth every 4 (four) hours as needed for severe pain. 09/09/13   Jearld Fenton, NP  pantoprazole (PROTONIX) 40 MG tablet  TAKE 1 TABLET BY MOUTH DAILY. 03/18/13   Doe-Hyun R Shawna Orleans, DO  pregabalin (LYRICA) 50 MG capsule Take 1 capsule (50 mg total) by mouth 3 (three) times daily. 10/15/13   Max T Hyatt, DPM  sertraline (ZOLOFT) 50 MG tablet Take 1 tablet (50 mg total) by mouth daily. 09/09/13   Jearld Fenton, NP  sitaGLIPtin (JANUVIA) 100 MG tablet Take 1 tablet (100 mg total) by mouth daily. 04/21/13   Renato Shin, MD  traMADol (ULTRAM) 50 MG tablet 1-2 tabs po q 6 hr prn pain Maximum dose= 8 tablets per day 10/28/13   Janne Napoleon, NP   BP 133/74  Pulse 117  Temp(Src) 98 F (36.7 C) (Oral)  Resp 20  Ht 5\' 4"  (1.626 m)  Wt 250 lb (113.399 kg)  BMI 42.89 kg/m2  SpO2 97% Physical Exam  Nursing note and vitals reviewed. Constitutional: She is oriented to person, place, and time. She appears well-developed and well-nourished. No distress.  HENT:  Head: Normocephalic and atraumatic.  Eyes: EOM are normal.  Neck: Normal range of motion. Neck supple.  Pulmonary/Chest: Effort normal.  Musculoskeletal:  Tenderness along volar wrist. Pain with wrist ext and flexion and use of index finger while keyboarding. Distal, N/V/S intact. Radial pulse 2+.  Neurological: She is alert and oriented to person, place, and time. No cranial nerve deficit.  Skin: Skin is warm and dry.  Psychiatric: She has a normal mood and affect.    ED Course  Procedures (including critical care time) Labs Review Labs Reviewed - No data to display  Imaging Review No results found.   MDM   1. Right wrist tendinitis     Wrist splint Ice mobic Tramadol F/U with your PCP as needed     Janne Napoleon, NP 10/28/13 1016

## 2013-10-28 NOTE — Discharge Instructions (Signed)
Repetitive Strain Injuries Repetitive strain injuries (RSIs) result from overuse or misuse of soft tissues including muscles, tendons, or nerves. Tendons are the cord-like structures that attach muscles to bones. RSIs can affect almost any part of the body. However, RSIs are most common in the arms (thumbs, wrists, elbows, shoulders) and legs (ankles, knees). Common medical conditions that are often caused by repetitive strain include carpal tunnel syndrome, tennis or golfer's elbow, bursitis, and tendonitis. If RSIs are treated early, and therepeated activity is reduced or removed, the severity and length of your problems can usually be reduced. RSIs are also called cumulative trauma disorders (CTD).  CAUSES  Many RSIs occur due to repeating the same activity at work over weeks or months without sufficient rest, such as prolonged typing. RSIs also commonly occur when a hobby or sport is done repeatedly without sufficient rest. RSIs can also occur due to repeated strain or stress on a body part in someone who has one or more risk factors for RSIs. RISK FACTORS Workplace risk factors  Frequent computer use, especially if your workstation is not adjusted for your body type.  Infrequent rest breaks.  Working in a high-pressure environment.  Working at a [employer].  Repeating the same motion, such as frequent typing.  Working in an awkward position or holding the same position for a long time.  Forceful movements such as lifting, pulling, or pushing.  Vibration caused by using power tools.  Working in cold temperatures.  Job stress. Personal risk factors  Poor posture.  Being loose-jointed.  Not exercising regularly.  Being overweight.  Arthritis, diabetes, thyroid problems, or other long-term (chronic)medical conditions.  Vitamin deficiencies.  Keeping your fingernails long.  An unhealthy, stressful, or inactive lifestyle.  Not sleeping well. SYMPTOMS  Symptoms often  begin at work but become more noticeable after the repeated stress has ended. For example, you may develop fatigue or soreness in your wrist while typingat work, and at night you may develop numbness and tingling in your fingers. Common symptoms include:   Burning, shooting, or aching pain, especially in the fingers, palms, wrists, forearms, or shoulders.  Tenderness.  Swelling.  Tingling, numbness, or loss of feeling.  Pain with certain activities, such as turning a doorknob or reaching above your head.  Weakness, heaviness, or loss of coordination in yourhand.  Muscle spasms or tightness. In some cases, symptoms can become so intense that it is difficult to perform everyday tasks. Symptoms that do not improve with rest may indicate a more serious condition.  DIAGNOSIS  Your caregiver may determine the type ofRSI you have based on your medical evaluation and a description of your activities.  TREATMENT : Ice and wrist splint Treatment depends on the severity and type of RSI you have. Your caregiver may recommend rest for the affected body part, medicines, and physical or occupational therapy to reduce pain, swelling, and soreness. Discuss the activities you do repeatedly with your caregiver. Your caregiver can help you decide whether you need to change your activities. An RSI may take months or years to heal, especially if the affected body part gets insufficient rest. In some cases, such as severe carpal tunnel syndrome, surgery may be recommended. PREVENTION  Talk with your supervisor to make sure you have the proper equipment for your work station.  Maintain good posture at your desk or work station with:  Feet flat on the floor.  Knees directly over the feet, bent at a right angle.  Lower back supported by  your chair or a cushion in the curve of your lower back.  Shoulders and arms relaxed and at your sides.  Neck relaxed and not bent forwards or backwards.  Your desk and  computer workstation properly adjusted to your body type.  Your chair adjusted so there is no excess pressure on the back of your thighs.  The keyboard resting above your thighs. You should be able to reach the keys with your elbows at your side, bent at a right angle. Your arms should be supported on forearm rests, with your forearms parallel to the ground.  The computer mouse within easy reach.  The monitor directly in front of you, so that your eyes are aligned with the top of the screen. The screen should be about 15 to 25 inches from your eyes.  While typing, keep your wrist straight, in a neutral position. Move your entire arm when you move your mouse or when typing hard-to-reach keys.  Only use your computer as much as you need to for work. Do not use it during breaks.  Take breaks often from any repeated activity. Alternate with another task which requires you to use different muscles, or rest at least once every hour.  Change positions regularly. If you spend a lot of time sitting, get up, walk around, and stretch.  Do not hold pens or pencils tightly when writing.  Exercise regularly.  Maintain a normal weight.  Eat a diet with plenty of vegetables, whole grains, and fruit.  Get sufficient, restful sleep. HOME CARE INSTRUCTIONS  If your caregiver prescribed medicine to help reduce swelling, take it as directed.  Only take over-the-counter or prescription medicines for pain, discomfort, or fever as directed by your caregiver.  Reduce, and if needed, stopthe activities that are causing your problems until you have no further symptoms.If your symptoms are work-related, you may need to talk to your supervisor about changing your activities.  When symptoms develop, put ice or a cold pack on the aching area.  Put ice in a plastic bag.  Place a towel between your skin and the bag.  Leave the ice on for 15-20 minutes.  If you were given a splint to keep your wrist from  bending, wear it as instructed. It is important to wear the splint at night. Use the splint for as long as your caregiver recommends. SEEK MEDICAL CARE IF:  You develop new problems.  Your problems do not get better with medicine. MAKE SURE YOU:  Understand these instructions.  Will watch your condition.  Will get help right away if you are not doing well or get worse. Document Released: 12/30/2001 Document Revised: 07/11/2011 Document Reviewed: 03/02/2011 [facility] Patient Information 2015 [place], [place]. This information is not intended to replace advice given to you by your health care provider. Make sure you discuss any questions you have with your health care provider.  Tendinitis  Tendinitis is redness, soreness, and puffiness (inflammation) of the tendons. Tendons are band-like tissues that connect muscle to bone. Tendinitis often happens in the shoulders, heels, or elbows. It might happen if your job involves doing the same motions over and over. HOME CARE  Use a sling or splint as told by your doctor.  Put ice on the injured area.  Put ice in a plastic bag.  Place a towel between your skin and the bag.  Leave the ice on for 15-20 minutes, 03-04 times a day.  Avoid using your injured arm or leg until the pain  goes away.  Do gentle exercises only as told by your doctor. Stop exercises if the pain gets worse, unless your doctor tells you otherwise.  Only take medicines as told by your doctor. GET HELP RIGHT AWAY IF:  Your pain and puffiness get worse.  You have new problems, such as loss of feeling (numbness) in the hands. MAKE SURE YOU:  Understand these instructions.  Will watch your condition.  Will get help right away if you are not doing well or get worse. Document Released: 04/21/2010 Document Revised: 04/03/2011 Document Reviewed: 04/21/2010 [facility] Patient Information 2015 [place], [place]. This information is not intended to replace advice given to you  by your health care provider. Make sure you discuss any questions you have with your health care provider.

## 2013-10-28 NOTE — ED Provider Notes (Signed)
Medical screening examination/treatment/procedure(s) were performed by resident physician or non-physician practitioner and as supervising physician I was immediately available for consultation/collaboration.   Pauline Good MD.   Billy Fischer, MD 10/28/13 [phone]

## 2013-10-28 NOTE — ED Notes (Signed)
Right wrist pain

## 2013-11-12 ENCOUNTER — Ambulatory Visit (INDEPENDENT_AMBULATORY_CARE_PROVIDER_SITE_OTHER): Payer: Self-pay | Admitting: Family Medicine

## 2013-11-12 VITALS — BP 118/88 | Wt 252.0 lb

## 2013-11-12 DIAGNOSIS — E118 Type 2 diabetes mellitus with unspecified complications: Secondary | ICD-10-CM

## 2013-11-17 ENCOUNTER — Ambulatory Visit (INDEPENDENT_AMBULATORY_CARE_PROVIDER_SITE_OTHER): Payer: 59 | Admitting: Podiatry

## 2013-11-17 ENCOUNTER — Encounter: Payer: Self-pay | Admitting: Podiatry

## 2013-11-17 VITALS — BP 120/82 | HR 93 | Resp 16

## 2013-11-17 DIAGNOSIS — E1140 Type 2 diabetes mellitus with diabetic neuropathy, unspecified: Secondary | ICD-10-CM

## 2013-11-17 DIAGNOSIS — E119 Type 2 diabetes mellitus without complications: Secondary | ICD-10-CM

## 2013-11-17 MED ORDER — PREGABALIN 75 MG PO CAPS: 75.0000 mg | ORAL_CAPSULE | Freq: Two times a day (BID) | ORAL | Status: DC

## 2013-11-17 NOTE — Progress Notes (Signed)
She presents today for follow-up of her Lyrica. Currently she is taking 50 mg twice daily. She states that she may be approximately 35% improved. She denies any side effects or complications.  Objective: Vital signs are stable she's alert and oriented 3. She has no edema no erythema saline as drainage or odor bilateral foot. Physical exam is essentially unchanged.  Assessment: Diabetic peripheral neuropathy bilateral.  Plan: Increase her Lyrica to 75 mg 1 by mouth twice a day.

## 2013-11-18 NOTE — Progress Notes (Signed)
Patient presents today for 3 month diabetes follow-up as part of the employer-sponsored [employer] program. Current diabetes regimen includes Metformin, Invokana, Januvia, and glimepirde. Patient also continues on daily ASA and ARB. Most recent MD follow-up was Oct 2015 with Dr. Loanne Drilling. She will follow-up Jan 2015. A1c at recent visit had increased from 8.4 to 8.6. MD started glimepiride as a result. Patient was also seen by a podiatrist for beginning stage neuropathy. Lyrica was started at this time and is being tolerated well by patient  Diabetes Assessment: Type of Diabetes: Type 2; Sees Diabetes provider 2 times per year; checks feet daily; hypoglycemia frequency None reported; uses glucometer; takes medications as prescribed; takes an aspirin a day; MD managing Diabetes Renato Shin, Endo; Highest CBG >200; checks blood glucose less than 1 time a week Other Diabetes History: Current med regimen includes Metformin 1000 mg twice daily, Invokana 300 mg daily, Januvia 100 mg daily, and most recently glimepiride 1 mg daily. Glimepiride was added earlier this month by endocrinologist due to upward trending A1c. Patient maintains good medication compliance with all meds including Benicar, which she now takes daily. Patient did not bring meter today but is currently testing less than once weekly, she is aware of the need for more frequent testing. Pt reports hyperglycemia with fasting glucose usually 180s or higher. Hypoglycemia frequency is rare. Patient does demonstrate appropriate correction of hypoglycemia. Patient is now being treated for neuropathy. She was prescribed lyrica by podiatrist, Dr. Milinda Pointer. She describes a burning in soles of feet that occurs daily but to varying degrees of discomfort. Patient is not due for yearly eye exam, and had exam this past July which was negative for diabetic retinopathy.  Lifestyle Factors: Diet - Diet continues to be less than optimal. Patient was under a great  deal of stress at last visit which resulted in emotional eating. Since then some of the stress has resolved as patient's boyfriend now has found a job and has an income now. This has relieved a great deal of financial stress but as a result patient and her boyfriend have been eating out a lot more and plan to celebrate this weekend. Discussing diet continues to cause anxiety for the patient and she struggles to set goals out of fear of failure. She will be out of town this weekend, celebrating her boyfriends new job, but says when she gets back on Monday she plans to take steps to regain control of diet and get back to a more normal and controlled schedule.  Exercise - Pt and her boyfriend joined [employer] at [employer] which meets Tue and [employer] for exercise and support meetings. She has enjoyed being part of this and will make efforts to attend all meetings and exercise sessions.  Assessment: Patient has continued to allow lifestyle and DM control to deteriorate over the past 3 months. At this visit A1c has increased from 8.4 to 8.6. Patient was disappointed by this. Patient did make efforts to join [employer] program at [facility] but remains aware of her need to improve both diet and exercise and will work on small goals over the next several weeks. I will follow-up in 3 months.  Plan: 1) Continue to work toward a healthy diet and attempt to make healthy choices 2) Investigate and consider enrolling in [facility] program 3) Resume testing at least 2-3 times per week 4) Talk with podiatrist about less expensive gabapentin, $4/mo (Neurontin) vs Lyrica $25/mo 5) Return for follow-up in 3 months on  Wednesday Jan 20th @ 9:30 am

## 2013-12-01 ENCOUNTER — Encounter: Payer: Self-pay | Admitting: Oncology

## 2013-12-01 ENCOUNTER — Telehealth: Payer: Self-pay | Admitting: Oncology

## 2013-12-01 ENCOUNTER — Other Ambulatory Visit: Payer: Self-pay | Admitting: Oncology

## 2013-12-01 NOTE — Progress Notes (Unsigned)
I was called today by Dr. Simona Huh, the gynecologist for Rhys Martini. She wanted to know if it was all right to leave the Mirena IUD in place. It turns out that Ms. Termine apparently has refused adjuvant radiation and also has not started adjuvant tamoxifen.  I reviewed all this with Dr. Simona Huh. It is true that the patient has noninvasive breast cancer, and in that sense it is not a life-threatening tumor. On the other hand the risk of local recurrence can be quite high after lumpectomy alone, and radiation reduces that risk by more than half.  If the patient is adamant that she does not want a appointment with radiation oncologythen I would strongly urge her to take tamoxifen.  If she was on tamoxifen I would have little objection to her continuing on the Mirena IUD. However if she is not on tamoxifen then I would urge her to remove it, since we do not have definitivedata that in patients with breast cancer this progesterone based implant is safe.  I am tentatively making a return appointment for the patient to see me within a month or so to discuss this further as needed, but my strong suggestion was that she contact radiation oncology and have the adjuvant radiation performed as originally planned.

## 2013-12-01 NOTE — Telephone Encounter (Signed)
, °

## 2013-12-05 ENCOUNTER — Encounter: Payer: Self-pay | Admitting: *Deleted

## 2013-12-05 ENCOUNTER — Other Ambulatory Visit: Payer: Self-pay | Admitting: *Deleted

## 2013-12-05 MED ORDER — TAMOXIFEN CITRATE 20 MG PO TABS: 20.0000 mg | ORAL_TABLET | Freq: Every day | ORAL | Status: DC

## 2013-12-09 ENCOUNTER — Encounter: Payer: Self-pay | Admitting: Family Medicine

## 2013-12-09 NOTE — Progress Notes (Signed)
Patient ID: Stacey Roberts, female   DOB: 09-01-1965, 48 y.o.   MRN: [id number] Reviewed: Agree with the documentation and management of our [place].

## 2013-12-16 ENCOUNTER — Telehealth: Payer: Self-pay

## 2013-12-16 MED ORDER — CANAGLIFLOZIN 300 MG PO TABS: 300.0000 mg | ORAL_TABLET | Freq: Every day | ORAL | Status: DC

## 2013-12-16 NOTE — Telephone Encounter (Signed)
Please refill prn 

## 2013-12-16 NOTE — Telephone Encounter (Signed)
Rx sent to pharmacy   

## 2013-12-16 NOTE — Telephone Encounter (Signed)
Received a refill request for Invokana. Medication is listed under a historical provider.  Ok to refill medication? Thanks!

## 2013-12-22 ENCOUNTER — Telehealth: Payer: Self-pay | Admitting: Nurse Practitioner

## 2013-12-22 NOTE — Telephone Encounter (Signed)
, °

## 2013-12-29 ENCOUNTER — Ambulatory Visit: Payer: 59 | Admitting: Podiatry

## 2013-12-30 ENCOUNTER — Ambulatory Visit: Payer: 59 | Admitting: Nurse Practitioner

## 2014-01-06 ENCOUNTER — Ambulatory Visit: Payer: Self-pay | Admitting: Emergency Medicine

## 2014-01-06 LAB — RAPID STREP-A WITH REFLX: Micro Text Report: NEGATIVE

## 2014-01-08 LAB — BETA STREP CULTURE(ARMC)

## 2014-01-13 ENCOUNTER — Other Ambulatory Visit: Payer: Self-pay | Admitting: Internal Medicine

## 2014-01-13 ENCOUNTER — Other Ambulatory Visit: Payer: Self-pay | Admitting: Oncology

## 2014-01-13 DIAGNOSIS — Z853 Personal history of malignant neoplasm of breast: Secondary | ICD-10-CM

## 2014-01-14 ENCOUNTER — Ambulatory Visit: Payer: 59 | Admitting: Podiatry

## 2014-01-19 ENCOUNTER — Other Ambulatory Visit: Payer: Self-pay | Admitting: *Deleted

## 2014-01-19 ENCOUNTER — Telehealth: Payer: Self-pay | Admitting: Endocrinology

## 2014-01-19 MED ORDER — LEVOTHYROXINE SODIUM 150 MCG PO TABS: 150.0000 ug | ORAL_TABLET | Freq: Every day | ORAL | Status: DC

## 2014-01-19 MED ORDER — METFORMIN HCL 1000 MG PO TABS: 1000.0000 mg | ORAL_TABLET | Freq: Two times a day (BID) | ORAL | Status: DC

## 2014-01-19 NOTE — Telephone Encounter (Signed)
Done

## 2014-01-19 NOTE — Telephone Encounter (Signed)
Patient states that she needs two refills sent to her pharmacy   Metformin  Levothyroxine   Pharmacy: [facility]   Thank you

## 2014-01-28 ENCOUNTER — Ambulatory Visit
Admission: RE | Admit: 2014-01-28 | Discharge: 2014-01-28 | Disposition: A | Discharge status: Home / Self Care | Payer: 59 | Source: Ambulatory Visit | Attending: Oncology | Admitting: Oncology

## 2014-01-28 DIAGNOSIS — Z853 Personal history of malignant neoplasm of breast: Secondary | ICD-10-CM

## 2014-01-30 ENCOUNTER — Ambulatory Visit: Payer: Self-pay | Admitting: Endocrinology

## 2014-02-03 ENCOUNTER — Encounter: Payer: Self-pay | Admitting: Endocrinology

## 2014-02-03 ENCOUNTER — Ambulatory Visit (INDEPENDENT_AMBULATORY_CARE_PROVIDER_SITE_OTHER): Payer: 59 | Admitting: Endocrinology

## 2014-02-03 VITALS — BP 124/76 | HR 98 | Temp 97.5°F | Ht 64.0 in | Wt 257.0 lb

## 2014-02-03 DIAGNOSIS — E118 Type 2 diabetes mellitus with unspecified complications: Secondary | ICD-10-CM

## 2014-02-03 NOTE — Patient Instructions (Addendum)
blood and urine tests are being requested for you today.  We'll contact you with results.  If the blood sugar is high, we'll add "welchol."  check your blood sugar once a day.  vary the time of day when you check, between before the 3 meals, and at bedtime.  also check if you have symptoms of your blood sugar being too high or too low.  please keep a record of the readings and bring it to your next appointment here.  You can write it on any piece of paper.  please call us sooner if your blood sugar goes below 70, or if you have a lot of readings over 200.  Please come back for a follow-up appointment in 3 months.

## 2014-02-03 NOTE — Progress Notes (Signed)
Subjective:    Patient ID: Stacey Roberts, female    DOB: [birth year], 49 y.o.   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    2/11: thyroidectomy: T1b N0 M0.  4/11: i-131 103 mci, with thyrogen.  12/11: neck US: single enlarged right cervical lymph node, nonspecific.   6/12: body scan (thyrogen) neg.   12/12: neck US: lymph node is stable 6/13: overall node morphology and volume show very little change  9/14  TG undetectable (ab neg) 3/15: TG undetectable (ab neg) 10/15 TG undetectable (ab neg) She does not notice any lump in the neck.   Pt also returns for f/u of diabetes mellitus: DM type: 2 Dx'ed: 2751 Complications: painful neuropathy of the lower extremities Therapy: 4 orals GDM: never DKA: never Severe hypoglycemia: never Pancreatitis: never Other: she has never taken insulin; she declines weight-loss surgery; edema precudes pioglitizone rx.  Interval history: no cbg record, but states cbg's are still in the high-100's.  Pt states slight burning on the soles of the feet, but no assoc numbness. Past Medical History  Diagnosis Date  . Diabetes mellitus, type II   . Obesity   . Hypertension   . Hyperlipidemia   . 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 Buddy Duty  . Bronchitis     hx of  . Hypothyroidism   . Snoring   . Sleep apnea 2008    severe OSA-does not use a cpap  . GERD (gastroesophageal reflux disease)     Past Surgical History  Procedure Laterality Date  . Tonsillectomy  1982  . Dilation and curettage of uterus  2004    s/p  . Thyroidectomy  02/2009    Dr Harlow Asa  . Breast biopsy  2000    s/p  . Breast lumpectomy Left 01/06/2013    Procedure: LUMPECTOMY;  Surgeon: Harl Bowie, MD;  Location: [place];  Service: General;  Laterality: Left;  . Breast biopsy Left 01/22/2013    Procedure: RE-EXCISION LEFT  BREAST DCIS;  Surgeon: Harl Bowie, MD;  Location: [place];  Service: General;  Laterality: Left;    History   Social History  . Marital Status: Single    Spouse Name: N/A    Number of Children: N/A  . Years of Education: N/A   Occupational History  .  [employer]    Outpatient scheduling in radiology   Social History Main Topics  . Smoking status: Former Smoker    Types: Cigarettes  . Smokeless tobacco: Never Used     Comment: Smoked on and off for 20 years. Quit about 5 years ago.  . Alcohol Use: Yes  . Drug Use: No  . Sexual Activity: Yes   Other Topics Concern  . Not on file   Social History Narrative   The patient is divorced, moved to [place] in   2006 from [place].  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    No alcohol.  Tobacco use:  She quit 5 years ago.  She smoked on   and off for approximately 20 years.  No history of recreational drug   Use.Drinks two cups of coffee per work day.     Current Outpatient Prescriptions on File Prior to Visit  Medication  Sig Dispense Refill  . ALPRAZolam (XANAX) 0.5 MG tablet Take 1 tablet (0.5 mg total) by mouth 2 (two) times daily as needed for anxiety. 30 tablet 0  . aspirin 81 MG tablet Take 81 mg by mouth daily.      . canagliflozin (INVOKANA) 300 MG TABS tablet Take 300 mg by mouth daily. 30 tablet 2  . glimepiride (AMARYL) 1 MG tablet Take 1 tablet (1 mg total) by mouth daily with breakfast. 90 tablet 3  . glucose blood (ONE TOUCH TEST STRIPS) test strip Use to check blood sugar three times a day 100 each 3  . levothyroxine (SYNTHROID, LEVOTHROID) 150 MCG tablet Take 1 tablet (150 mcg total) by mouth daily before breakfast. 30 tablet 2  . meloxicam (MOBIC) 15 MG tablet Take 1 tablet (15 mg total) by mouth daily. 14 tablet 0  . metFORMIN (GLUCOPHAGE) 1000 MG tablet Take 1 tablet (1,000 mg total) by  mouth 2 (two) times daily with a meal. 60 tablet 1  . Multiple Vitamin (MULTIVITAMIN WITH MINERALS) TABS tablet Take 1 tablet by mouth daily.    Marland Kitchen olmesartan (BENICAR) 20 MG tablet Take 1 tablet (20 mg total) by mouth daily. 30 tablet 5  . ONE TOUCH LANCETS MISC Use to check blood sugar three time a day     . oxyCODONE-acetaminophen (ROXICET) 5-325 MG per tablet Take 1-2 tablets by mouth every 4 (four) hours as needed for severe pain. 40 tablet 0  . pantoprazole (PROTONIX) 40 MG tablet TAKE 1 TABLET BY MOUTH DAILY. 90 tablet 3  . pregabalin (LYRICA) 75 MG capsule Take 1 capsule (75 mg total) by mouth 2 (two) times daily. 60 capsule 5  . sertraline (ZOLOFT) 50 MG tablet Take 1 tablet (50 mg total) by mouth daily. 30 tablet 3  . sitaGLIPtin (JANUVIA) 100 MG tablet Take 1 tablet (100 mg total) by mouth daily. 90 tablet 3  . tamoxifen (NOLVADEX) 20 MG tablet Take 1 tablet (20 mg total) by mouth daily. 90 tablet 3  . traMADol (ULTRAM) 50 MG tablet 1-2 tabs po q 6 hr prn pain Maximum dose= 8 tablets per day 20 tablet 0  . [DISCONTINUED] Calcium Carbonate 1500 MG TABS Take 1,500 mg by mouth daily.       No current facility-administered medications on file prior to visit.    Allergies  Allergen Reactions  . Penicillins Shortness Of Breath and Rash  . Ace Inhibitors     REACTION: cough    Family History  Problem Relation Age of Onset  . Dementia Father     deceased age 1 secondary to dementia  . Bipolar disorder Father   . Emphysema Father   . Heart disease Father   . CAD Mother 85    Died age 51 of CAD  . Heart disease Mother   . CAD Maternal Grandfather 60    BP 124/76 mmHg  Pulse 98  Temp(Src) 97.5 F (36.4 C) (Oral)  Ht 5\' 4"  (1.626 m)  Wt 257 lb (116.574 kg)  BMI 44.09 kg/m2  SpO2 97%    Review of Systems She denies hypoglycemia and weight change.      Objective:   Physical Exam VITAL SIGNS:  See vs page GENERAL: no distress Neck: a healed scar is present.  i do not  appreciate a nodule in the thyroid or elsewhere in the neck Pulses: dorsalis pedis intact bilat.   MSK: no deformity of the feet CV: trace bilat leg edema Skin:  no  ulcer on the feet.  normal color and temp on the feet. Neuro: sensation is intact to touch on the feet   Lab Results  Component Value Date   HGBA1C 9.4* 02/03/2014       Assessment & Plan:  DM: severe exacerbation. Dyslipidemia: severe exacerbation. Paresthesias, new, prob due to DM.  rx of this is better glycemic control.   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    Patient is advised the following: Patient Instructions  blood and urine tests are being requested for you today.  We'll contact you with results.  If the blood sugar is high, we'll add "welchol."  check your blood sugar once a day.  vary the time of day when you check, between before the 3 meals, and at bedtime.  also check if you have symptoms of your blood sugar being too high or too low.  please keep a record of the readings and bring it to your next appointment here.  You can write it on any piece of paper.  please call us sooner if your blood sugar goes below 70, or if you have a lot of readings over 200.  Please come back for a follow-up appointment in 3 months.     i have sent a prescription to your pharmacy, to add "welchol." However, insulin will most likely be needed to get the a1c back down.

## 2014-02-04 LAB — LIPID PANEL
CHOL/HDL RATIO: 5
Cholesterol: 234 mg/dL — ABNORMAL HIGH (ref 0–200)
HDL: 44.9 mg/dL (ref >39.00)
NONHDL: 189.1
Triglycerides: 357 mg/dL — ABNORMAL HIGH (ref 0.0–149.0)
VLDL: 71.4 mg/dL — ABNORMAL HIGH (ref 0.0–40.0)

## 2014-02-04 LAB — BASIC METABOLIC PANEL
BUN: 22 mg/dL (ref 6–23)
CHLORIDE: 99 meq/L (ref 96–112)
CO2: 26 mEq/L (ref 19–32)
Calcium: 9.5 mg/dL (ref 8.4–10.5)
Creatinine, Ser: 0.76 mg/dL (ref 0.40–1.20)
GFR: 85.96 mL/min (ref >60.00)
Glucose, Bld: 152 mg/dL — ABNORMAL HIGH (ref 70–99)
Potassium: 4.3 mEq/L (ref 3.5–5.1)
Sodium: 134 mEq/L — ABNORMAL LOW (ref 135–145)

## 2014-02-04 LAB — MICROALBUMIN / CREATININE URINE RATIO
CREATININE, U: 61.9 mg/dL
MICROALB UR: 0.3 mg/dL (ref 0.0–1.9)
MICROALB/CREAT RATIO: 0.5 mg/g (ref 0.0–30.0)

## 2014-02-04 LAB — LDL CHOLESTEROL, DIRECT: Direct LDL: 157 mg/dL

## 2014-02-04 LAB — HEMOGLOBIN A1C: HEMOGLOBIN A1C: 9.4 % — AB (ref 4.6–6.5)

## 2014-02-04 MED ORDER — COLESEVELAM HCL 625 MG PO TABS: 1875.0000 mg | ORAL_TABLET | Freq: Two times a day (BID) | ORAL | Status: DC

## 2014-02-09 ENCOUNTER — Telehealth: Payer: Self-pay | Admitting: Oncology

## 2014-02-09 NOTE — Telephone Encounter (Signed)
Faxed pt medical records to [facility].  Slides and scans will be fedex'ed °

## 2014-03-11 ENCOUNTER — Ambulatory Visit (INDEPENDENT_AMBULATORY_CARE_PROVIDER_SITE_OTHER): Payer: Self-pay | Admitting: Family Medicine

## 2014-03-11 ENCOUNTER — Other Ambulatory Visit: Payer: Self-pay | Admitting: Endocrinology

## 2014-03-11 VITALS — BP 124/88 | Ht 64.0 in | Wt 254.0 lb

## 2014-03-11 DIAGNOSIS — E118 Type 2 diabetes mellitus with unspecified complications: Secondary | ICD-10-CM

## 2014-03-11 NOTE — Progress Notes (Signed)
Patient presents today for 3 month diabetes follow-up as part of the employer-sponsored [employer] program. Current diabetes regimen includes Metformin, Invokana, Januvia, and glimepirde. Patient also continues on daily ASA and ARB. Most recent MD follow-up was Jan 2016 with Dr. Loanne Drilling. She will follow-up April 2016. A1c at recent visit had increased from 8.6 to 9.4 Lipids were also elevated. MD started [facility] as a result. Patient continues tolerating lyrica well for neuropathy.   Diabetes Assessment: Type of Diabetes: Type 2; Sees Diabetes provider 2 times per year; checks feet daily; hypoglycemia frequency None reported; uses glucometer; takes medications as prescribed; takes an aspirin a day; MD managing Diabetes Renato Shin, Endo; checks blood glucose less than 1 time a week; Other Diabetes History: Current med regimen includes Metformin 1000 mg twice daily, Invokana 300 mg daily, Januvia 100 mg daily, and most recently glimepiride 1 mg daily. Patient maintains good medication compliance with all meds including Benicar, which she now takes daily. Patient did not bring meter today but is not currently testing. Patient wants to resume testing, and is aware that her numbers will be elevated with an A1c of >9. I have encouraged her not to be discouraged by these numbers. Hypoglycemia frequency is rare. Patient does demonstrate appropriate correction of hypoglycemia. Patient is now being treated for neuropathy and feels her symptoms are under good control. She was prescribed lyrica by podiatrist, Dr. Milinda Pointer. She also has purchased two new pair of shoes and insoles and says these are helpful. She describes a burning in soles of feet that occurs daily but to varying degrees of discomfort. Patient is not due for yearly eye exam, and had exam this past July which was negative for diabetic retinopathy.  Lifestyle Factors: Diet - Diet continues to be less than optimal. Patient continues to be under a great  deal of stress resulting in emotional eating. Discussing diet continues to cause anxiety for the patient and she struggles to set goals out of fear of failure. She recognizes the need to improve diet and expresses an interest in making healthier choices. She reports that she will soon begin packing her lunch more often as her job location is moving and feels this will help her better control her diet.  Exercise - No routine exercise at this time, patient does not wish to set goals today, but wants to focus on diet.   Plan: 1) Continue to work toward a healthy diet and attempt to make healthy choices including packing your own lunch 2) Resume testing glucose at least twice per week 3) Consider resuming exercise 4) Return for follow-up in 3 months on Tuesday May 17th @ 9:30 am

## 2014-03-20 ENCOUNTER — Other Ambulatory Visit: Payer: Self-pay | Admitting: Obstetrics and Gynecology

## 2014-03-20 ENCOUNTER — Other Ambulatory Visit (HOSPITAL_COMMUNITY)
Admission: RE | Admit: 2014-03-20 | Discharge: 2014-03-20 | Disposition: A | Discharge status: Home / Self Care | Payer: 59 | Source: Ambulatory Visit | Attending: Obstetrics and Gynecology | Admitting: Obstetrics and Gynecology

## 2014-03-20 DIAGNOSIS — Z1151 Encounter for screening for human papillomavirus (HPV): Secondary | ICD-10-CM | POA: Diagnosis present

## 2014-03-20 DIAGNOSIS — Z01419 Encounter for gynecological examination (general) (routine) without abnormal findings: Secondary | ICD-10-CM | POA: Diagnosis not present

## 2014-03-23 ENCOUNTER — Other Ambulatory Visit: Payer: Self-pay | Admitting: Endocrinology

## 2014-03-24 LAB — CYTOLOGY - PAP

## 2014-03-26 NOTE — Progress Notes (Signed)
Patient ID: Stacey Roberts, female   DOB: 10/20/1965, 49 y.o.   MRN: [id number] Reviewed: Agree with the documentation and management of our [place].

## 2014-04-09 ENCOUNTER — Ambulatory Visit: Payer: Self-pay | Admitting: Physician Assistant

## 2014-04-20 ENCOUNTER — Other Ambulatory Visit: Payer: Self-pay | Admitting: Internal Medicine

## 2014-04-20 ENCOUNTER — Other Ambulatory Visit: Payer: Self-pay | Admitting: Endocrinology

## 2014-04-20 ENCOUNTER — Encounter: Payer: Self-pay | Admitting: Endocrinology

## 2014-05-08 ENCOUNTER — Ambulatory Visit (INDEPENDENT_AMBULATORY_CARE_PROVIDER_SITE_OTHER): Payer: 59 | Admitting: Endocrinology

## 2014-05-08 ENCOUNTER — Encounter: Payer: Self-pay | Admitting: Endocrinology

## 2014-05-08 VITALS — BP 128/68 | HR 96 | Temp 97.6°F | Wt 258.0 lb

## 2014-05-08 DIAGNOSIS — C73 Malignant neoplasm of thyroid gland: Secondary | ICD-10-CM

## 2014-05-08 DIAGNOSIS — E118 Type 2 diabetes mellitus with unspecified complications: Secondary | ICD-10-CM

## 2014-05-08 DIAGNOSIS — E890 Postprocedural hypothyroidism: Secondary | ICD-10-CM | POA: Diagnosis not present

## 2014-05-08 NOTE — Progress Notes (Signed)
Subjective:    Patient ID: Stacey Roberts, female    DOB: 1965-09-14, 49 y.o.   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    2/11: thyroidectomy: T1b N0 M0.  4/11: i-131 103 mci, with thyrogen.  12/11: neck US: single enlarged right cervical lymph node, nonspecific.   6/12: body scan (thyrogen) neg.   12/12: [place]: lymph node is stable 6/13: [place]: overall node morphology and volume show very little change  9/14  TG undetectable (ab neg) 3/15: TG undetectable (ab neg) 10/15 TG undetectable (ab neg) She does not notice any lump in the neck.   Pt also returns for f/u of diabetes mellitus: DM type: 2 Dx'ed: 3532 Complications: painful neuropathy of the lower extremities Therapy: 5 oral meds GDM: never DKA: never Severe hypoglycemia: never Pancreatitis: never Other: she has never taken insulin; she declines weight-loss surgery; edema precudes pioglitizone rx.  Interval history: no cbg record, but states cbg's are still in the high-100's. She tolerates meds well. Past Medical History  Diagnosis Date  . Diabetes mellitus, type II   . Obesity   . Hypertension   . Hyperlipidemia   . 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 Buddy Duty  . Bronchitis     hx of  . Hypothyroidism   . Snoring   . Sleep apnea 2008    severe OSA-does not use a cpap  . GERD (gastroesophageal reflux disease)     Past Surgical History  Procedure Laterality Date  . Tonsillectomy  1982  . Dilation and curettage of uterus  2004    s/p  . Thyroidectomy  02/2009    Dr Harlow Asa  . Breast biopsy  2000    s/p  . Breast lumpectomy Left 01/06/2013    Procedure: LUMPECTOMY;  Surgeon: Harl Bowie, MD;  Location: [place];  Service: General;  Laterality: Left;  . Breast biopsy Left 01/22/2013    Procedure: RE-EXCISION LEFT BREAST DCIS;  Surgeon: Harl Bowie, MD;   Location: [place];  Service: General;  Laterality: Left;    History   Social History  . Marital Status: Single    Spouse Name: N/A  . Number of Children: N/A  . Years of Education: N/A   Occupational History  .  [employer]    Outpatient scheduling in radiology   Social History Main Topics  . Smoking status: Former Smoker    Types: Cigarettes  . Smokeless tobacco: Never Used     Comment: Smoked on and off for 20 years. Quit about 5 years ago.  . Alcohol Use: Yes  . Drug Use: No  . Sexual Activity: Yes   Other Topics Concern  . Not on file   Social History Narrative   The patient is divorced, moved to [place] in   2006 from [place].  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    No alcohol.  Tobacco use:  She quit 5 years ago.  She smoked on   and off for approximately 20 years.  No history of recreational drug   Use.Drinks two cups of coffee per work day.     Current Outpatient Prescriptions on File Prior to Visit  Medication Sig Dispense Refill  . ALPRAZolam (XANAX) 0.5 MG tablet Take  1 tablet (0.5 mg total) by mouth 2 (two) times daily as needed for anxiety. 30 tablet 0  . aspirin 81 MG tablet Take 81 mg by mouth daily.      Marland Kitchen BENICAR 20 MG tablet TAKE 1 TABLET BY MOUTH ONCE DAILY 30 tablet 5  . colesevelam (WELCHOL) 625 MG tablet Take 3 tablets (1,875 mg total) by mouth 2 (two) times daily with a meal. 540 tablet 11  . glimepiride (AMARYL) 1 MG tablet Take 1 tablet (1 mg total) by mouth daily with breakfast. 90 tablet 3  . glucose blood (ONE TOUCH TEST STRIPS) test strip Use to check blood sugar three times a day 100 each 3  . INVOKANA 300 MG TABS tablet TAKE 1 TABLET BY MOUTH ONCE DAILY 30 tablet 5  . levothyroxine (SYNTHROID, LEVOTHROID) 150 MCG tablet TAKE 1 TABLET BY MOUTH DAILY BEFORE BREAKFAST 30 tablet 2  . meloxicam (MOBIC) 15 MG tablet Take 1  tablet (15 mg total) by mouth daily. 14 tablet 0  . metFORMIN (GLUCOPHAGE) 1000 MG tablet TAKE 1 TABLET BY MOUTH 2 TIMES DAILY WITH A MEAL. 60 tablet 5  . Multiple Vitamin (MULTIVITAMIN WITH MINERALS) TABS tablet Take 1 tablet by mouth daily.    . ONE TOUCH LANCETS MISC Use to check blood sugar three time a day     . oxyCODONE-acetaminophen (ROXICET) 5-325 MG per tablet Take 1-2 tablets by mouth every 4 (four) hours as needed for severe pain. 40 tablet 0  . pantoprazole (PROTONIX) 40 MG tablet TAKE 1 TABLET BY MOUTH DAILY. 90 tablet 3  . pregabalin (LYRICA) 75 MG capsule Take 1 capsule (75 mg total) by mouth 2 (two) times daily. 60 capsule 5  . sertraline (ZOLOFT) 50 MG tablet Take 1 tablet (50 mg total) by mouth daily. 30 tablet 3  . sitaGLIPtin (JANUVIA) 100 MG tablet Take 1 tablet (100 mg total) by mouth daily. 90 tablet 3  . tamoxifen (NOLVADEX) 20 MG tablet Take 1 tablet (20 mg total) by mouth daily. 90 tablet 3  . traMADol (ULTRAM) 50 MG tablet 1-2 tabs po q 6 hr prn pain Maximum dose= 8 tablets per day 20 tablet 0  . [DISCONTINUED] Calcium Carbonate 1500 MG TABS Take 1,500 mg by mouth daily.       No current facility-administered medications on file prior to visit.    Allergies  Allergen Reactions  . Penicillins Shortness Of Breath and Rash  . Ace Inhibitors     REACTION: cough    Family History  Problem Relation Age of Onset  . Dementia Father     deceased age 64 secondary to dementia  . Bipolar disorder Father   . Emphysema Father   . Heart disease Father   . CAD Mother 63    Died age 44 of CAD  . Heart disease Mother   . CAD Maternal Grandfather 60    BP 128/68 mmHg  Pulse 96  Temp(Src) 97.6 F (36.4 C) (Oral)  Wt 258 lb (117.028 kg)  SpO2 96%  Review of Systems She denies hypoglycemia and weight change    Objective:   Physical Exam VITAL SIGNS:  See vs page GENERAL: no distress Neck: a healed scar is present.  i do not appreciate a nodule in the thyroid or  elsewhere in the neck. Pulses: dorsalis pedis intact bilat.   MSK: no deformity of the feet CV: no leg edema Skin:  no ulcer on the feet.  normal color and temp on the  feet. Neuro: sensation is intact to touch on the feet     Assessment & Plan:  DM: not improved Obesity: persistent.  We discussed.  She again declines surgery.  Thyroid cancer: no clinical evidence of recurrence.   Patient is advised the following: Patient Instructions  blood tests are being requested for you today.  We'll contact you with results.  If the blood sugar is high, we can increase the glimepiride. check your blood sugar once a day.  vary the time of day when you check, between before the 3 meals, and at bedtime.  also check if you have symptoms of your blood sugar being too high or too low.  please keep a record of the readings and bring it to your next appointment here.  You can write it on any piece of paper.  please call us sooner if your blood sugar goes below 70, or if you have a lot of readings over 200.  Please come back for a follow-up appointment in 3 months.     i offered byetta

## 2014-05-08 NOTE — Patient Instructions (Addendum)
blood tests are being requested for you today.  We'll contact you with results.  If the blood sugar is high, we can increase the glimepiride. check your blood sugar once a day.  vary the time of day when you check, between before the 3 meals, and at bedtime.  also check if you have symptoms of your blood sugar being too high or too low.  please keep a record of the readings and bring it to your next appointment here.  You can write it on any piece of paper.  please call us sooner if your blood sugar goes below 70, or if you have a lot of readings over 200.  Please come back for a follow-up appointment in 3 months.

## 2014-05-11 LAB — HEMOGLOBIN A1C: Hgb A1c MFr Bld: 9.6 % — ABNORMAL HIGH (ref 4.6–6.5)

## 2014-05-11 LAB — TSH: TSH: 1.37 u[IU]/mL (ref 0.35–4.50)

## 2014-05-12 ENCOUNTER — Other Ambulatory Visit: Payer: Self-pay | Admitting: Endocrinology

## 2014-05-12 LAB — THYROGLOBULIN ANTIBODY

## 2014-05-12 LAB — THYROGLOBULIN LEVEL: Thyroglobulin: <0.1 ng/mL — ABNORMAL LOW (ref 2.8–40.9)

## 2014-05-12 MED ORDER — GLIMEPIRIDE 4 MG PO TABS: 4.0000 mg | ORAL_TABLET | Freq: Every day | ORAL | Status: DC

## 2014-05-21 ENCOUNTER — Encounter: Payer: Self-pay | Admitting: Endocrinology

## 2014-05-27 ENCOUNTER — Emergency Department (INDEPENDENT_AMBULATORY_CARE_PROVIDER_SITE_OTHER): Payer: 59

## 2014-05-27 ENCOUNTER — Emergency Department (HOSPITAL_COMMUNITY)
Admission: EM | Admit: 2014-05-27 | Discharge: 2014-05-27 | Disposition: A | Discharge status: Home / Self Care | Payer: 59 | Source: Home / Self Care | Attending: Family Medicine | Admitting: Family Medicine

## 2014-05-27 ENCOUNTER — Encounter (HOSPITAL_COMMUNITY): Payer: Self-pay | Admitting: *Deleted

## 2014-05-27 DIAGNOSIS — J00 Acute nasopharyngitis [common cold]: Secondary | ICD-10-CM

## 2014-05-27 LAB — POCT RAPID STREP A: Streptococcus, Group A Screen (Direct): NEGATIVE

## 2014-05-27 MED ORDER — IPRATROPIUM BROMIDE 0.06 % NA SOLN: 2.0000 | Freq: Four times a day (QID) | NASAL | Status: DC

## 2014-05-27 MED ORDER — BENZONATATE 100 MG PO CAPS: 100.0000 mg | ORAL_CAPSULE | Freq: Three times a day (TID) | ORAL | Status: DC | PRN

## 2014-05-27 NOTE — ED Provider Notes (Signed)
CSN: [id number]     Arrival date & time 05/27/14  0806 History   First MD Initiated Contact with Patient 05/27/14 [phone]     Chief Complaint  Patient presents with  . URI   (Consider location/radiation/quality/duration/timing/severity/associated sxs/prior Treatment) HPI Comments: Nonsmoker Works in scheduling for radiology dept PCP: Garnette Gunner  Patient is a 49 y.o. female presenting with URI. The history is provided by the patient.  URI Presenting symptoms: congestion, cough, rhinorrhea and sore throat   Presenting symptoms: no ear pain, no facial pain, no fatigue and no fever   Severity:  Moderate Onset quality:  Gradual Duration:  5 days Timing:  Constant Progression:  Unchanged Chronicity:  New   Past Medical History  Diagnosis Date  . Diabetes mellitus, type II   . Obesity   . Hypertension   . Hyperlipidemia   . 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 Buddy Duty  . Bronchitis     hx of  . Hypothyroidism   . Snoring   . Sleep apnea 2008    severe OSA-does not use a cpap  . GERD (gastroesophageal reflux disease)    Past Surgical History  Procedure Laterality Date  . Tonsillectomy  1982  . Dilation and curettage of uterus  2004    s/p  . Thyroidectomy  02/2009    Dr Harlow Asa  . Breast biopsy  2000    s/p  . Breast lumpectomy Left 01/06/2013    Procedure: LUMPECTOMY;  Surgeon: Harl Bowie, MD;  Location: [place];  Service: General;  Laterality: Left;  . Breast biopsy Left 01/22/2013    Procedure: RE-EXCISION LEFT BREAST DCIS;  Surgeon: Harl Bowie, MD;  Location: [place];  Service: General;  Laterality: Left;   Family History  Problem Relation Age of Onset  . Dementia Father     deceased age 70 secondary to dementia  . Bipolar disorder Father   . Emphysema Father   . Heart disease Father   . CAD Mother 73    Died age 9 of CAD  . Heart  disease Mother   . CAD Maternal Grandfather 60   History  Substance Use Topics  . Smoking status: Former Smoker    Types: Cigarettes  . Smokeless tobacco: Never Used     Comment: Smoked on and off for 20 years. Quit about 5 years ago.  . Alcohol Use: Yes   OB History    No data available     Review of Systems  Constitutional: Negative for fever and fatigue.  HENT: Positive for congestion, rhinorrhea and sore throat. Negative for ear pain.   Eyes: Negative.   Respiratory: Positive for cough. Negative for chest tightness and shortness of breath.   Cardiovascular: Negative.   Musculoskeletal: Negative for back pain.  Skin: Negative.     Allergies  Penicillins and Ace inhibitors  Home Medications   Prior to Admission medications   Medication Sig Start Date End Date Taking? Authorizing Provider  ALPRAZolam Duanne Moron) 0.5 MG tablet Take 1 tablet (0.5 mg total) by mouth 2 (two) times daily as needed for anxiety. 09/09/13   Jearld Fenton, NP  aspirin 81 MG tablet Take 81 mg by mouth daily.      Historical Provider, MD  BENICAR 20 MG tablet TAKE 1 TABLET BY MOUTH ONCE DAILY 04/20/14   Jearld Fenton, NP  benzonatate (TESSALON) 100 MG capsule Take  1 capsule (100 mg total) by mouth 3 (three) times daily as needed for cough. 05/27/14   Audelia Hives Dilcia Rybarczyk, PA  colesevelam [facility]) 625 MG tablet Take 3 tablets (1,875 mg total) by mouth 2 (two) times daily with a meal. 02/04/14   Renato Shin, MD  glimepiride (AMARYL) 4 MG tablet Take 1 tablet (4 mg total) by mouth daily before breakfast. 05/12/14   Renato Shin, MD  glucose blood (ONE TOUCH TEST STRIPS) test strip Use to check blood sugar three times a day 05/10/11   Doe-Hyun R Shawna Orleans, DO  INVOKANA 300 MG TABS tablet TAKE 1 TABLET BY MOUTH ONCE DAILY 03/23/14   Renato Shin, MD  ipratropium (ATROVENT) 0.06 % nasal spray Place 2 sprays into both nostrils 4 (four) times daily. 05/27/14   Lutricia Feil, PA  levothyroxine (SYNTHROID,  LEVOTHROID) 150 MCG tablet TAKE 1 TABLET BY MOUTH DAILY BEFORE BREAKFAST 04/20/14   Renato Shin, MD  meloxicam (MOBIC) 15 MG tablet Take 1 tablet (15 mg total) by mouth daily. 10/28/13   Janne Napoleon, NP  metFORMIN (GLUCOPHAGE) 1000 MG tablet TAKE 1 TABLET BY MOUTH 2 TIMES DAILY WITH A MEAL. 03/11/14   Renato Shin, MD  Multiple Vitamin (MULTIVITAMIN WITH MINERALS) TABS tablet Take 1 tablet by mouth daily.    Historical Provider, MD  ONE TOUCH LANCETS MISC Use to check blood sugar three time a day     Historical Provider, MD  oxyCODONE-acetaminophen (ROXICET) 5-325 MG per tablet Take 1-2 tablets by mouth every 4 (four) hours as needed for severe pain. 09/09/13   Jearld Fenton, NP  pantoprazole (PROTONIX) 40 MG tablet TAKE 1 TABLET BY MOUTH DAILY. 04/20/14   Lucille Passy, MD  pregabalin (LYRICA) 75 MG capsule Take 1 capsule (75 mg total) by mouth 2 (two) times daily. 11/17/13   Max T Hyatt, DPM  sertraline (ZOLOFT) 50 MG tablet Take 1 tablet (50 mg total) by mouth daily. 09/09/13   Jearld Fenton, NP  sitaGLIPtin (JANUVIA) 100 MG tablet Take 1 tablet (100 mg total) by mouth daily. 04/21/13   Renato Shin, MD  tamoxifen (NOLVADEX) 20 MG tablet Take 1 tablet (20 mg total) by mouth daily. 12/05/13   Chauncey Cruel, MD  traMADol (ULTRAM) 50 MG tablet 1-2 tabs po q 6 hr prn pain Maximum dose= 8 tablets per day 10/28/13   Janne Napoleon, NP   BP 130/84 mmHg  Pulse 78  Temp(Src) 98.6 F (37 C) (Oral)  Resp 20  SpO2 100% Physical Exam  Constitutional: She is oriented to person, place, and time. She appears well-developed and well-nourished. No distress.  HENT:  Head: Normocephalic and atraumatic.  Right Ear: Hearing, tympanic membrane, external ear and ear canal normal.  Left Ear: Hearing, tympanic membrane, external ear and ear canal normal.  Nose: Nose normal.  Mouth/Throat: Uvula is midline, oropharynx is clear and moist and mucous membranes are normal.  Eyes: Conjunctivae are normal.  Neck: Normal  range of motion. Neck supple.  Cardiovascular: Normal rate, regular rhythm and normal heart sounds.   Pulmonary/Chest: Effort normal and breath sounds normal. No respiratory distress. She has no wheezes.  Musculoskeletal: Normal range of motion.  Neurological: She is alert and oriented to person, place, and time.  Skin: Skin is warm and dry.  Psychiatric: She has a normal mood and affect. Her behavior is normal.  Nursing note and vitals reviewed.   ED Course  Procedures (including critical care time) Labs Review Labs Reviewed  POCT RAPID STREP A ([facility] URG CARE ONLY)    Imaging Review Dg Chest 2 View  05/27/2014   CLINICAL DATA:  Upper respiratory infection, history of bronchitis, type 2 diabetes  EXAM: CHEST  2 VIEW  COMPARISON:  05/15/2012  FINDINGS: Cardiomediastinal silhouette is stable. No acute infiltrate or pleural effusion. No pulmonary edema. Bony thorax is unremarkable.  IMPRESSION: No active cardiopulmonary disease.   Electronically Signed   By: Lahoma Crocker M.D.   On: 05/27/2014 09:18     MDM   1. Common cold    Rapid strep negative CXR NAD Exam and vital signs reassuring Consistent with common cold Symptomatic care at home    [place], [place] 05/27/14 [phone]

## 2014-05-27 NOTE — ED Notes (Signed)
Pt  Reports         Cough    Congested         sorethroat           Nasal  Congested   Alternating      With         Runny  Nose         Symptoms   Not          releived        By      otc  meds                 pt  Sitting  Upright on  The  Exam table  Speaking   In  Complete  sentannces

## 2014-05-27 NOTE — Discharge Instructions (Signed)
Rapid strep test negative. Will contact by phone is 3 day throat culture indicates the need for any additional treatment. Chest xray is normal. Your exam and vital signs are very reassuring. I expect your symptoms will improve over the next several days. Please use medications as prescribed for cough and congestion and follow up with your doctor if symptoms do not improve over the next several days.  Upper Respiratory Infection, Adult An upper respiratory infection (URI) is also sometimes known as the common cold. The upper respiratory tract includes the nose, sinuses, throat, trachea, and bronchi. Bronchi are the airways leading to the lungs. Most people improve within 1 week, but symptoms can last up to 2 weeks. A residual cough may last even longer.  CAUSES Many different viruses can infect the tissues lining the upper respiratory tract. The tissues become irritated and inflamed and often become very moist. Mucus production is also common. A cold is contagious. You can easily spread the virus to others by oral contact. This includes kissing, sharing a glass, coughing, or sneezing. Touching your mouth or nose and then touching a surface, which is then touched by another person, can also spread the virus. SYMPTOMS  Symptoms typically develop 1 to 3 days after you come in contact with a cold virus. Symptoms vary from person to person. They may include:  Runny nose.  Sneezing.  Nasal congestion.  Sinus irritation.  Sore throat.  Loss of voice (laryngitis).  Cough.  Fatigue.  Muscle aches.  Loss of appetite.  Headache.  Low-grade fever. DIAGNOSIS  You might diagnose your own cold based on familiar symptoms, since most people get a cold 2 to 3 times a year. Your caregiver can confirm this based on your exam. Most importantly, your caregiver can check that your symptoms are not due to another disease such as strep throat, sinusitis, pneumonia, asthma, or epiglottitis. Blood tests, throat  tests, and X-rays are not necessary to diagnose a common cold, but they may sometimes be helpful in excluding other more serious diseases. Your caregiver will decide if any further tests are required. RISKS AND COMPLICATIONS  You may be at risk for a more severe case of the common cold if you smoke cigarettes, have chronic heart disease (such as heart failure) or lung disease (such as asthma), or if you have a weakened immune system. The very young and very old are also at risk for more serious infections. Bacterial sinusitis, middle ear infections, and bacterial pneumonia can complicate the common cold. The common cold can worsen asthma and chronic obstructive pulmonary disease (COPD). Sometimes, these complications can require emergency medical care and may be life-threatening. PREVENTION  The best way to protect against getting a cold is to practice good hygiene. Avoid oral or hand contact with people with cold symptoms. Wash your hands often if contact occurs. There is no clear evidence that vitamin C, vitamin E, echinacea, or exercise reduces the chance of developing a cold. However, it is always recommended to get plenty of rest and practice good nutrition. TREATMENT  Treatment is directed at relieving symptoms. There is no cure. Antibiotics are not effective, because the infection is caused by a virus, not by bacteria. Treatment may include:  Increased fluid intake. Sports drinks offer valuable electrolytes, sugars, and fluids.  Breathing heated mist or steam (vaporizer or shower).  Eating chicken soup or other clear broths, and maintaining good nutrition.  Getting plenty of rest.  Using gargles or lozenges for comfort.  Controlling fevers with  ibuprofen or acetaminophen as directed by your caregiver.  Increasing usage of your inhaler if you have asthma. Zinc gel and zinc lozenges, taken in the first 24 hours of the common cold, can shorten the duration and lessen the severity of  symptoms. Pain medicines may help with fever, muscle aches, and throat pain. A variety of non-prescription medicines are available to treat congestion and runny nose. Your caregiver can make recommendations and may suggest nasal or lung inhalers for other symptoms.  HOME CARE INSTRUCTIONS   Only take over-the-counter or prescription medicines for pain, discomfort, or fever as directed by your caregiver.  Use a warm mist humidifier or inhale steam from a shower to increase air moisture. This may keep secretions moist and make it easier to breathe.  Drink enough water and fluids to keep your urine clear or pale yellow.  Rest as needed.  Return to work when your temperature has returned to normal or as your caregiver advises. You may need to stay home longer to avoid infecting others. You can also use a face mask and careful hand washing to prevent spread of the virus. SEEK MEDICAL CARE IF:   After the first few days, you feel you are getting worse rather than better.  You need your caregiver's advice about medicines to control symptoms.  You develop chills, worsening shortness of breath, or brown or red sputum. These may be signs of pneumonia.  You develop yellow or brown nasal discharge or pain in the face, especially when you bend forward. These may be signs of sinusitis.  You develop a fever, swollen neck glands, pain with swallowing, or white areas in the back of your throat. These may be signs of strep throat. SEEK IMMEDIATE MEDICAL CARE IF:   You have a fever.  You develop severe or persistent headache, ear pain, sinus pain, or chest pain.  You develop wheezing, a prolonged cough, cough up blood, or have a change in your usual mucus (if you have chronic lung disease).  You develop sore muscles or a stiff neck. Document Released: 07/05/2000 Document Revised: 04/03/2011 Document Reviewed: 04/16/2013 [facility] Patient Information 2015 [place], [place]. This information is not intended  to replace advice given to you by your health care provider. Make sure you discuss any questions you have with your health care provider.

## 2014-05-30 ENCOUNTER — Telehealth (HOSPITAL_COMMUNITY): Payer: Self-pay | Admitting: Family Medicine

## 2014-05-30 LAB — CULTURE, GROUP A STREP: STREP A CULTURE: POSITIVE — AB

## 2014-05-30 MED ORDER — CLINDAMYCIN HCL 300 MG PO CAPS: 300.0000 mg | ORAL_CAPSULE | Freq: Three times a day (TID) | ORAL | Status: DC

## 2014-05-30 NOTE — ED Notes (Signed)
Strep culture positive. Clindamycin called in to [facility] outpatient pharmacy. Voicemail left for patient.  Gregor Hams, MD 05/30/14 [phone]

## 2014-05-31 NOTE — ED Notes (Addendum)
Left message for patient to call about new pharmacy for strep medication if desired

## 2014-06-01 ENCOUNTER — Telehealth (HOSPITAL_COMMUNITY): Payer: Self-pay | Admitting: Family Medicine

## 2014-06-01 MED ORDER — CEPHALEXIN 500 MG PO CAPS: 500.0000 mg | ORAL_CAPSULE | Freq: Three times a day (TID) | ORAL | Status: DC

## 2014-06-01 NOTE — ED Notes (Signed)
Patient called back. She cannot tolerate clindamycin and penicillin. She can take keflex. Keflex called in to [place].   Gregor Hams, MD 06/01/14 [phone]

## 2014-06-09 ENCOUNTER — Ambulatory Visit: Payer: 59 | Admitting: Pharmacist

## 2014-06-15 ENCOUNTER — Other Ambulatory Visit: Payer: Self-pay | Admitting: Podiatry

## 2014-07-14 ENCOUNTER — Other Ambulatory Visit: Payer: Self-pay | Admitting: Endocrinology

## 2014-07-14 ENCOUNTER — Encounter: Payer: Self-pay | Admitting: Endocrinology

## 2014-07-14 DIAGNOSIS — E118 Type 2 diabetes mellitus with unspecified complications: Secondary | ICD-10-CM

## 2014-07-20 ENCOUNTER — Other Ambulatory Visit: Payer: Self-pay | Admitting: Endocrinology

## 2014-07-21 ENCOUNTER — Encounter: Payer: Self-pay | Admitting: Endocrinology

## 2014-07-21 ENCOUNTER — Other Ambulatory Visit: Payer: Self-pay

## 2014-07-21 MED ORDER — LEVOTHYROXINE SODIUM 150 MCG PO TABS: ORAL_TABLET | ORAL | Status: DC

## 2014-07-23 ENCOUNTER — Telehealth: Payer: Self-pay

## 2014-08-03 ENCOUNTER — Ambulatory Visit: Payer: Self-pay | Admitting: Oncology

## 2014-08-05 ENCOUNTER — Other Ambulatory Visit: Payer: Self-pay

## 2014-08-10 NOTE — Patient Outreach (Signed)
[facility]) Care Management  Standard   08/05/14   HARLEY MCCARTNEY 11-08-1965 [id number]  Subjective: Ms. Kemler is a 49 year old female who is participating in the Employee Link to [employer].  She presents for her 3 month follow up visit.  She states she is not following a diet right now.  She is eating out a lot throughout the week.  She also stated she is not exercising as much.  She does report walking at her lunch break occasionally.  Her [name] was 9.8%. She does monitor her blood sugar but not routinely.  She is very compliant with her medications.  She recently increased her glimepride to 4 mg daily.  Her blood sugars have decreased some with this increase.  She stated she is interested in Byetta and will discuss it with her provider at her next visit.   Objective:   Blood pressure: 130/82  Current Medications: Current Outpatient Prescriptions  Medication Sig Dispense Refill  . aspirin 81 MG tablet Take 81 mg by mouth daily.      Marland Kitchen BENICAR 20 MG tablet TAKE 1 TABLET BY MOUTH ONCE DAILY 30 tablet 5  . colesevelam (WELCHOL) 625 MG tablet Take 3 tablets (1,875 mg total) by mouth 2 (two) times daily with a meal. 540 tablet 11  . glimepiride (AMARYL) 4 MG tablet Take 1 tablet (4 mg total) by mouth daily before breakfast. 90 tablet 3  . INVOKANA 300 MG TABS tablet TAKE 1 TABLET BY MOUTH ONCE DAILY 30 tablet 5  . JANUVIA 100 MG tablet TAKE 1 TABLET BY MOUTH ONCE DAILY 90 tablet 3  . levothyroxine (SYNTHROID, LEVOTHROID) 150 MCG tablet TAKE 1 TABLET BY MOUTH DAILY BEFORE BREAKFAST 30 tablet 2  . LYRICA 75 MG capsule TAKE 1 CAPSULE BY MOUTH TWICE DAILY 60 capsule 5  . metFORMIN (GLUCOPHAGE) 1000 MG tablet TAKE 1 TABLET BY MOUTH 2 TIMES DAILY WITH A MEAL. 60 tablet 5  . Multiple Vitamin (MULTIVITAMIN WITH MINERALS) TABS tablet Take 1 tablet by mouth daily.    . pantoprazole (PROTONIX) 40 MG tablet TAKE 1 TABLET BY MOUTH DAILY. 90 tablet 3  . tamoxifen  (NOLVADEX) 20 MG tablet Take 1 tablet (20 mg total) by mouth daily. 90 tablet 3  . ALPRAZolam (XANAX) 0.5 MG tablet Take 1 tablet (0.5 mg total) by mouth 2 (two) times daily as needed for anxiety. (Patient not taking: Reported on 08/05/2014) 30 tablet 0  . benzonatate (TESSALON) 100 MG capsule Take 1 capsule (100 mg total) by mouth 3 (three) times daily as needed for cough. (Patient not taking: Reported on 08/05/2014) 21 capsule 0  . cephALEXin (KEFLEX) 500 MG capsule Take 1 capsule (500 mg total) by mouth 3 (three) times daily. (Patient not taking: Reported on 08/05/2014) 21 capsule 0  . clindamycin (CLEOCIN) 300 MG capsule Take 1 capsule (300 mg total) by mouth 3 (three) times daily. (Patient not taking: Reported on 08/05/2014) 30 capsule 0  . glucose blood (ONE TOUCH TEST STRIPS) test strip Use to check blood sugar three times a day (Patient not taking: Reported on 08/05/2014) 100 each 3  . ipratropium (ATROVENT) 0.06 % nasal spray Place 2 sprays into both nostrils 4 (four) times daily. (Patient not taking: Reported on 08/05/2014) 15 mL 0  . meloxicam (MOBIC) 15 MG tablet Take 1 tablet (15 mg total) by mouth daily. (Patient not taking: Reported on 08/05/2014) 14 tablet 0  . ONE TOUCH LANCETS MISC Use to  check blood sugar three time a day     . oxyCODONE-acetaminophen (ROXICET) 5-325 MG per tablet Take 1-2 tablets by mouth every 4 (four) hours as needed for severe pain. (Patient not taking: Reported on 08/05/2014) 40 tablet 0  . sertraline (ZOLOFT) 50 MG tablet Take 1 tablet (50 mg total) by mouth daily. (Patient not taking: Reported on 08/05/2014) 30 tablet 3  . traMADol (ULTRAM) 50 MG tablet 1-2 tabs po q 6 hr prn pain Maximum dose= 8 tablets per day (Patient not taking: Reported on 08/05/2014) 20 tablet 0  . [DISCONTINUED] Calcium Carbonate 1500 MG TABS Take 1,500 mg by mouth daily.       No current facility-administered medications for this visit.    Functional Status: In your present state of health,  do you have any difficulty performing the following activities: 08/05/2014  Hearing? N  Vision? N  Difficulty concentrating or making decisions? N  Walking or climbing stairs? N  Dressing or bathing? N  Doing errands, shopping? N    Fall/Depression Screening: PHQ 2/9 Scores 08/05/2014 09/09/2013  PHQ - 2 Score 0 1    Assessment: 1.  Diabetes:  Not at goal of less than 7%.  Recently had an increase in her glimeprimide.  She is out a lot which is contributing to her high blood sugars.  2.  Blood Pressure:  At goal of less than 130/80.  Plan: 1.  Ms. Nida will cook dinner at home at least four times a week.   2.  She will continue to exercise trying to be more consistent during the week.  3.  She will try to monitor her blood sugars routinely at least once a day. 4.  She will follow up with Pharmacy in a month on September 04, 2014.     [facility] CM Care Plan Problem One        Patient Outreach from 08/05/2014 in [place] Problem One  Eating out too much   Care Plan for Problem One  Active   THN CM Short Term Goal #1 (0-30 days)  Ms. Wirkkala will cook dinner at home four days a week over the next 30 days evidence by patient report   THN CM Short Term Goal #1 Start Date  08/05/14   Interventions for Short Term Goal #1  Discussed strategies on how to prepare to eat out home,  having more food at the house to cook,  taking food out in the mornings to have that night,  shop on the weekend to prepare for the week.     [facility] CM Care Plan Problem Two        Patient Outreach from 08/05/2014 in [place] Problem Two  Not exercising on a routine basis   Care Plan for Problem Two  Active   THN CM Short Term Goal #1 (0-30 days)  Ms. Barnhardt will exercise (walking) for at least 30 minutes a day for five days a week evidence by patient report.   THN CM Short Term Goal #1 Start Date  08/05/14   Interventions for Short Term Goal #2   Encourage continue  to walk on work breaks and at home,   Encouraged more consistency     Deanne Coffer, PharmD, [place] [phone]

## 2014-08-18 ENCOUNTER — Ambulatory Visit: Payer: 59 | Admitting: Nutrition

## 2014-08-26 ENCOUNTER — Encounter: Payer: 59 | Admitting: Nutrition

## 2014-09-01 ENCOUNTER — Ambulatory Visit (INDEPENDENT_AMBULATORY_CARE_PROVIDER_SITE_OTHER): Payer: 59 | Admitting: Endocrinology

## 2014-09-01 ENCOUNTER — Encounter: Payer: Self-pay | Admitting: Endocrinology

## 2014-09-01 VITALS — BP 137/84 | HR 98 | Temp 97.7°F | Ht 64.0 in | Wt 258.0 lb

## 2014-09-01 DIAGNOSIS — E785 Hyperlipidemia, unspecified: Secondary | ICD-10-CM | POA: Diagnosis not present

## 2014-09-01 MED ORDER — EXENATIDE 10 MCG/0.04ML ~~LOC~~ SOPN: 10.0000 ug | PEN_INJECTOR | Freq: Two times a day (BID) | SUBCUTANEOUS | Status: DC

## 2014-09-01 NOTE — Patient Instructions (Addendum)
check your blood sugar once a day.  vary the time of day when you check, between before the 3 meals, and at bedtime.  also check if you have symptoms of your blood sugar being too high or too low.  please keep a record of the readings and bring it to your next appointment here.  You can write it on any piece of paper.  please call us sooner if your blood sugar goes below 70, or if you have a lot of readings over 200.  Please come back for a follow-up appointment in 3 months.   Please change the januvia back to byetta.  I have sent a prescription to your pharmacy.   This is unlikely to get your blood sugar back down to where it needs to be.  Therefore, please call if the blood sugar stays in the mid to high-100's, so we can change to novolog.

## 2014-09-01 NOTE — Progress Notes (Signed)
Subjective:    Patient ID: Stacey Roberts, female    DOB: [birth year], 49 y.o.   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    2/11: thyroidectomy: T1b N0 M0.  4/11: i-131 103 mci, with thyrogen.  12/11: neck US: single enlarged right cervical lymph node, nonspecific.   6/12: body scan (thyrogen) neg.   12/12: [place]: lymph node is stable 6/13: [place]: overall node morphology and volume show very little change  9/14  TG undetectable (ab neg) 3/15: TG undetectable (ab neg) 10/15 TG undetectable (ab neg) She does not notice any lump in the neck.   Pt also returns for f/u of diabetes mellitus: DM type: 2 Dx'ed: 6283 Complications: painful neuropathy of the lower extremities Therapy: 5 oral meds GDM: never DKA: never Severe hypoglycemia: never Pancreatitis: never Other: she has never taken insulin; she declines weight-loss surgery; edema precudes pioglitizone rx.  Interval history: no cbg record, but states cbg's are still in the high-100's. She tolerates meds well.  Past Medical History  Diagnosis Date  . Diabetes mellitus, type II   . Obesity   . Hypertension   . Hyperlipidemia   . 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 Buddy Duty  . Bronchitis     hx of  . Hypothyroidism   . Snoring   . Sleep apnea 2008    severe OSA-does not use a cpap  . GERD (gastroesophageal reflux disease)     Past Surgical History  Procedure Laterality Date  . Tonsillectomy  1982  . Dilation and curettage of uterus  2004    s/p  . Thyroidectomy  02/2009    Dr Harlow Asa  . Breast biopsy  2000    s/p  . Breast lumpectomy Left 01/06/2013    Procedure: LUMPECTOMY;  Surgeon: Harl Bowie, MD;  Location: [place];  Service: General;  Laterality: Left;  . Breast biopsy Left 01/22/2013    Procedure: RE-EXCISION LEFT BREAST DCIS;  Surgeon: Harl Bowie, MD;   Location: [place];  Service: General;  Laterality: Left;    Social History   Social History  . Marital Status: Single    Spouse Name: N/A  . Number of Children: N/A  . Years of Education: N/A   Occupational History  .  [employer]    Outpatient scheduling in radiology   Social History Main Topics  . Smoking status: Former Smoker    Types: Cigarettes  . Smokeless tobacco: Never Used     Comment: Smoked on and off for 20 years. Quit about 5 years ago.  . Alcohol Use: Yes  . Drug Use: No  . Sexual Activity: Yes   Other Topics Concern  . Not on file   Social History Narrative   The patient is divorced, moved to [place] in   2006 from [place].  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    No alcohol.  Tobacco use:  She quit 5 years ago.  She smoked on   and off for approximately 20 years.  No history of recreational drug   Use.Drinks two cups of coffee per work day.     Current Outpatient Prescriptions on File Prior to Visit  Medication Sig Dispense Refill  . ALPRAZolam (XANAX) 0.5 MG  tablet Take 1 tablet (0.5 mg total) by mouth 2 (two) times daily as needed for anxiety. 30 tablet 0  . aspirin 81 MG tablet Take 81 mg by mouth daily.      Marland Kitchen BENICAR 20 MG tablet TAKE 1 TABLET BY MOUTH ONCE DAILY 30 tablet 5  . clindamycin (CLEOCIN) 300 MG capsule Take 1 capsule (300 mg total) by mouth 3 (three) times daily. 30 capsule 0  . colesevelam (WELCHOL) 625 MG tablet Take 3 tablets (1,875 mg total) by mouth 2 (two) times daily with a meal. 540 tablet 11  . glimepiride (AMARYL) 4 MG tablet Take 1 tablet (4 mg total) by mouth daily before breakfast. 90 tablet 3  . glucose blood (ONE TOUCH TEST STRIPS) test strip Use to check blood sugar three times a day 100 each 3  . INVOKANA 300 MG TABS tablet TAKE 1 TABLET BY MOUTH ONCE DAILY 30 tablet 5  . ipratropium (ATROVENT) 0.06 %  nasal spray Place 2 sprays into both nostrils 4 (four) times daily. 15 mL 0  . levothyroxine (SYNTHROID, LEVOTHROID) 150 MCG tablet TAKE 1 TABLET BY MOUTH DAILY BEFORE BREAKFAST 30 tablet 2  . LYRICA 75 MG capsule TAKE 1 CAPSULE BY MOUTH TWICE DAILY 60 capsule 5  . meloxicam (MOBIC) 15 MG tablet Take 1 tablet (15 mg total) by mouth daily. 14 tablet 0  . metFORMIN (GLUCOPHAGE) 1000 MG tablet TAKE 1 TABLET BY MOUTH 2 TIMES DAILY WITH A MEAL. 60 tablet 5  . Multiple Vitamin (MULTIVITAMIN WITH MINERALS) TABS tablet Take 1 tablet by mouth daily.    . ONE TOUCH LANCETS MISC Use to check blood sugar three time a day     . oxyCODONE-acetaminophen (ROXICET) 5-325 MG per tablet Take 1-2 tablets by mouth every 4 (four) hours as needed for severe pain. 40 tablet 0  . pantoprazole (PROTONIX) 40 MG tablet TAKE 1 TABLET BY MOUTH DAILY. 90 tablet 3  . sertraline (ZOLOFT) 50 MG tablet Take 1 tablet (50 mg total) by mouth daily. 30 tablet 3  . tamoxifen (NOLVADEX) 20 MG tablet Take 1 tablet (20 mg total) by mouth daily. 90 tablet 3  . traMADol (ULTRAM) 50 MG tablet 1-2 tabs po q 6 hr prn pain Maximum dose= 8 tablets per day 20 tablet 0  . [DISCONTINUED] Calcium Carbonate 1500 MG TABS Take 1,500 mg by mouth daily.       No current facility-administered medications on file prior to visit.    Allergies  Allergen Reactions  . Penicillins Shortness Of Breath and Rash  . Ace Inhibitors     REACTION: cough  . Clindamycin/Lincomycin Other (See Comments)    Severe stomach cramps    Family History  Problem Relation Age of Onset  . Dementia Father     deceased age 65 secondary to dementia  . Bipolar disorder Father   . Emphysema Father   . Heart disease Father   . CAD Mother 51    Died age 46 of CAD  . Heart disease Mother   . CAD Maternal Grandfather 60    BP 137/84 mmHg  Pulse 98  Temp(Src) 97.7 F (36.5 C) (Oral)  Ht 5\' 4"  (1.626 m)  Wt 258 lb (117.028 kg)  BMI 44.26 kg/m2  SpO2 96%  Review of  Systems No weight change    Objective:   Physical Exam VITAL SIGNS:  See vs page GENERAL: no distress Pulses: dorsalis pedis intact bilat.   MSK: no deformity of the feet  CV: trace bilat leg edema Skin:  no ulcer on the feet.  normal color and temp on the feet. Neuro: sensation is intact to touch on the feet   A1c=9.8% Lab Results  Component Value Date   CHOL 222* 09/01/2014   HDL 39.00* 09/01/2014   LDLCALC 70 11/15/2011   LDLDIRECT 153.0 09/01/2014   TRIG 317.0* 09/01/2014   CHOLHDL 6 09/01/2014      Assessment & Plan:  DM: slightly worse.  i advised pt she most likely needs insulin.   Dyslipidemia: she needs increased rx.  Patient is advised the following: Patient Instructions  check your blood sugar once a day.  vary the time of day when you check, between before the 3 meals, and at bedtime.  also check if you have symptoms of your blood sugar being too high or too low.  please keep a record of the readings and bring it to your next appointment here.  You can write it on any piece of paper.  please call us sooner if your blood sugar goes below 70, or if you have a lot of readings over 200.  Please come back for a follow-up appointment in 3 months.   Please change the januvia back to byetta.  I have sent a prescription to your pharmacy.   This is unlikely to get your blood sugar back down to where it needs to be.  Therefore, please call if the blood sugar stays in the mid to high-100's, so we can change to novolog.     addendum: i advised addition of lipitor

## 2014-09-02 LAB — LIPID PANEL
CHOL/HDL RATIO: 6
Cholesterol: 222 mg/dL — ABNORMAL HIGH (ref 0–200)
HDL: 39 mg/dL — AB (ref >39.00)
NONHDL: 182.55
Triglycerides: 317 mg/dL — ABNORMAL HIGH (ref 0.0–149.0)
VLDL: 63.4 mg/dL — AB (ref 0.0–40.0)

## 2014-09-02 LAB — LDL CHOLESTEROL, DIRECT: LDL DIRECT: 153 mg/dL

## 2014-09-04 ENCOUNTER — Ambulatory Visit: Payer: 59

## 2014-09-11 ENCOUNTER — Encounter: Payer: Self-pay | Admitting: Pharmacist

## 2014-09-11 ENCOUNTER — Other Ambulatory Visit: Payer: Self-pay | Admitting: Pharmacist

## 2014-09-12 ENCOUNTER — Encounter: Payer: 59 | Attending: Internal Medicine | Admitting: Dietician

## 2014-09-12 DIAGNOSIS — Z713 Dietary counseling and surveillance: Secondary | ICD-10-CM | POA: Insufficient documentation

## 2014-09-12 NOTE — Progress Notes (Signed)
Patient was seen on 09/12/14 for the Weight Loss Class at the [facility]. The following learning objectives were met by the patient during this class:   Describe healthy choices in each food group  Describe portion size of foods  Use plate method for meal planning  Demonstrate how to read Nutrition Facts food label  Set realistic goals for weight loss, diet changes, and physical activity.   Goals:  1. Make healthy food choices in each food group.  2. Reduce portion size of foods.  3. Increase fruit and vegetable intake.  4. Use plate method for meal planning.  5. Increase physical activity.    Handouts given:   1. Nutrition Strategies for Weight Loss   2. Meal plan/portion card   3. MyPlate Planner   4. Weight Management Recipe Resources   5. Bake, Broil, Grill   

## 2014-09-12 NOTE — Patient Outreach (Signed)
[facility]) Care Management  [place]   09/12/2014  Stacey Roberts 1965-03-21 [id number]  Subjective: Stacey Roberts is a 49 year old female who is participating in the Employee Link to [employer].  She presents for her 1 month follow up visit.  She states she continues to eat out a lot since her last visit, but she is working on Materials engineer when eating out.  She has increased cooking at home to 3 to 4 times per week.  She continues to take walking breaks during work to increase exercise, but she has not been able to consistently exercise for 30 minutes a day five days a week.  Her most recent A1c was 9.8%.  She reports monitoring her blood sugars two to three times daily.  Her 30 day blood sugar average was 189 mg/dL.  She is very compliant with her medications, but often forgets to take her Welchol (3 tablets BID with lunch and dinner).  She recently initiated Byetta 10 mcg BID and stopped taking Januvia.    Objective:   Current Medications: Current Outpatient Prescriptions  Medication Sig Dispense Refill  . aspirin 81 MG tablet Take 81 mg by mouth daily.      Marland Kitchen BENICAR 20 MG tablet TAKE 1 TABLET BY MOUTH ONCE DAILY 30 tablet 5  . colesevelam (WELCHOL) 625 MG tablet Take 3 tablets (1,875 mg total) by mouth 2 (two) times daily with a meal. 540 tablet 11  . exenatide (BYETTA 10 MCG PEN) 10 MCG/0.04ML SOPN injection Inject 0.04 mLs (10 mcg total) into the skin 2 (two) times daily with a meal. And pen needles 2/day 2.4 mL 11  . glimepiride (AMARYL) 4 MG tablet Take 1 tablet (4 mg total) by mouth daily before breakfast. 90 tablet 3  . glucose blood (ONE TOUCH TEST STRIPS) test strip Use to check blood sugar three times a day 100 each 3  . INVOKANA 300 MG TABS tablet TAKE 1 TABLET BY MOUTH ONCE DAILY 30 tablet 5  . levothyroxine (SYNTHROID, LEVOTHROID) 150 MCG tablet TAKE 1 TABLET BY MOUTH DAILY BEFORE BREAKFAST 30 tablet 2  . LYRICA 75 MG capsule TAKE 1  CAPSULE BY MOUTH TWICE DAILY 60 capsule 5  . metFORMIN (GLUCOPHAGE) 1000 MG tablet TAKE 1 TABLET BY MOUTH 2 TIMES DAILY WITH A MEAL. 60 tablet 5  . Multiple Vitamin (MULTIVITAMIN WITH MINERALS) TABS tablet Take 1 tablet by mouth daily.    . ONE TOUCH LANCETS MISC Use to check blood sugar three time a day     . pantoprazole (PROTONIX) 40 MG tablet TAKE 1 TABLET BY MOUTH DAILY. 90 tablet 3  . ALPRAZolam (XANAX) 0.5 MG tablet Take 1 tablet (0.5 mg total) by mouth 2 (two) times daily as needed for anxiety. (Patient not taking: Reported on 09/11/2014) 30 tablet 0  . clindamycin (CLEOCIN) 300 MG capsule Take 1 capsule (300 mg total) by mouth 3 (three) times daily. (Patient not taking: Reported on 09/11/2014) 30 capsule 0  . ipratropium (ATROVENT) 0.06 % nasal spray Place 2 sprays into both nostrils 4 (four) times daily. (Patient not taking: Reported on 09/11/2014) 15 mL 0  . meloxicam (MOBIC) 15 MG tablet Take 1 tablet (15 mg total) by mouth daily. (Patient not taking: Reported on 09/11/2014) 14 tablet 0  . oxyCODONE-acetaminophen (ROXICET) 5-325 MG per tablet Take 1-2 tablets by mouth every 4 (four) hours as needed for severe pain. (Patient not taking: Reported on 09/11/2014) 40 tablet 0  .  sertraline (ZOLOFT) 50 MG tablet Take 1 tablet (50 mg total) by mouth daily. (Patient not taking: Reported on 09/11/2014) 30 tablet 3  . tamoxifen (NOLVADEX) 20 MG tablet Take 1 tablet (20 mg total) by mouth daily. (Patient not taking: Reported on 09/11/2014) 90 tablet 3  . traMADol (ULTRAM) 50 MG tablet 1-2 tabs po q 6 hr prn pain Maximum dose= 8 tablets per day (Patient not taking: Reported on 09/11/2014) 20 tablet 0  . [DISCONTINUED] Calcium Carbonate 1500 MG TABS Take 1,500 mg by mouth daily.       No current facility-administered medications for this visit.    Functional Status: In your present state of health, do you have any difficulty performing the following activities: 08/05/2014  Hearing? N  Vision? N   Difficulty concentrating or making decisions? N  Walking or climbing stairs? N  Dressing or bathing? N  Doing errands, shopping? N    Fall/Depression Screening: PHQ 2/9 Scores 08/05/2014 09/09/2013  PHQ - 2 Score 0 1    Assessment: 1.  Diabetes: Not at goal of less than 7%.  Recently was switched from [place] to Byetta.  Patient appears motivated to improve diet at this time.    2.  Blood pressure: at goal of less than 140/90.  3.  Medication compliance: Patient reports she forgets to take her Welchol approximately 5 days per week.  She is currently taking 3 tablets with lunch and supper.   Plan: 1. Stacey Roberts will make healthier choices when eating out.  Discussed using calorie king to help her make healthy choices.   2. She will continue to exercise trying to be most consistent during the week.  3. Counseled patient that Welchol can be taken once daily (6 tablets) to increase compliance. 4. She will follow up with Pharmacy in one month on September 16th, 2016.    [facility] CM Care Plan Problem One        Patient Outreach from 09/11/2014 in [place] Problem One  Eating out too much   Care Plan for Problem One  Active   THN CM Short Term Goal #1 (0-30 days)  Stacey Roberts will cook dinner at home four days a week over the next 30 days evidence by patient report   THN CM Short Term Goal #1 Start Date  08/05/14   [facility] CM Short Term Goal #1 Met Date  -- [Patient did not meet goal]   Interventions for Short Term Goal #1  Discussed strategies on how to prepare to eat out home,  having more food at the house to cook,  taking food out in the mornings to have that night,  shop on the weekend to prepare for the week.    THN CM Short Term Goal #2 (0-30 days)  Stacey Roberts will make better food choices when eating out or at home.     THN CM Short Term Goal #2 Start Date  09/11/14   Interventions for Short Term Goal #2  Discussed healthy eating choices. Talked about calorie  king to look up nutrition information    [facility] CM Care Plan Problem Two        Patient Outreach from 09/11/2014 in [place] Problem Two  Not exercising on a routine basis   Care Plan for Problem Two  Active   THN CM Short Term Goal #1 (0-30 days)  Stacey Roberts will exercise (walking) for at least 30 minutes a  day for five days a week evidence by patient report.   THN CM Short Term Goal #1 Start Date  08/05/14   THN CM Short Term Goal #1 Met Date   -- [Did not meet goal]   Interventions for Short Term Goal #2   Encourage continue to walk on work breaks and at home,   Encouraged more consistency   THN CM Short Term Goal #2 (0-30 days)  Stacey Roberts will exercise (walking) for at least 30 minutes per day four days per week.     THN CM Short Term Goal #2 Start Date  09/11/14   Interventions for Short Term Goal #2  Encourage continue to walk on work breaks and at home,   Encouraged more consistency   THN CM Short Term Goal #3 (0-30 days)  Stacey Roberts will take the stairs to her second floor office instead of using the elevator.    THN CM Short Term Goal #3 Start Date  09/11/14   Interventions for Short Term Goal #3  I educated Stacey Roberts on how taking the stairs instead of the elevator to her second floor office would be a good way to increase daily activity.      Elisabeth Most, Pharm.D. Pharmacy Resident [place]

## 2014-09-29 ENCOUNTER — Other Ambulatory Visit: Payer: Self-pay | Admitting: Endocrinology

## 2014-10-09 ENCOUNTER — Ambulatory Visit: Payer: 59 | Admitting: Pharmacist

## 2014-10-16 ENCOUNTER — Ambulatory Visit: Payer: Self-pay | Admitting: Pharmacist

## 2014-10-19 ENCOUNTER — Other Ambulatory Visit: Payer: Self-pay | Admitting: Endocrinology

## 2014-10-19 NOTE — Telephone Encounter (Signed)
Patient need refill of Levothyroxine and diabetic test strips,

## 2014-10-29 ENCOUNTER — Other Ambulatory Visit: Payer: Self-pay | Admitting: Internal Medicine

## 2014-11-02 ENCOUNTER — Other Ambulatory Visit: Payer: Self-pay | Admitting: Pharmacist

## 2014-11-02 NOTE — Patient Outreach (Signed)
[facility]) Care Management  11/02/2014  Stacey Roberts 1965/10/07 [id number]   Patient did not show up for appointment today.     Elisabeth Most, Pharm.D. Pharmacy Resident [place] [phone]

## 2014-11-09 ENCOUNTER — Telehealth: Payer: Self-pay | Admitting: Oncology

## 2014-11-09 ENCOUNTER — Other Ambulatory Visit: Payer: Self-pay | Admitting: Pharmacist

## 2014-11-09 NOTE — Patient Outreach (Signed)
Stacey Roberts) Care Management  [place]   11/09/2014  Stacey Roberts [birth year] [id number]  Subjective: Patient presents today for 1 month diabetes follow-up as part of the employer-sponsored [employer] program.  Current diabetes regimen includes canagliflozin 3863m daily, Byetta 159m BID, glimepiride 63m42maily, and metformin 1000m67mD.  Patient also continues on daily ASA and ARB.  She is currently taking Welchol for her cholesterol, but often forgets to take it due to pill burden and having to separate it from her levothyroxine.  Most recent MD follow-up was 09/01/14.  Patient has a pending appt for 12/02/14 with Dr. ElliLoanne Drillingo med changes or major health changes at this time.   Patient reports she continues to eat out a lot since her last visit, and she reports she knows she is not eating right.  She does report she has limited the snacks in the house and is working on portion sizes when she eats at home.  She reports she has not been as active because work has been busy and she is unable to take walks during her breaks.  Her most recent A1c was 9.8%.  She reports currently monitoring her blood glucose 1 to 2 times weekly.  Her 30 day blood sugar average was 195mg60m  She denies hypoglycemia.    Objective:  BP: 137/88  Current Medications: Current Outpatient Prescriptions  Medication Sig Dispense Refill  . aspirin 81 MG tablet Take 81 mg by mouth daily.      . colesevelam (WELCHOL) 625 MG tablet Take 3 tablets (1,875 mg total) by mouth 2 (two) times daily with a meal. 540 tablet 11  . exenatide (BYETTA 10 MCG PEN) 10 MCG/0.04ML SOPN injection Inject 0.04 mLs (10 mcg total) into the skin 2 (two) times daily with a meal. And pen needles 2/day 2.4 mL 11  . glimepiride (AMARYL) 4 MG tablet Take 1 tablet (4 mg total) by mouth daily before breakfast. 90 tablet 3  . INVOKANA 300 MG TABS tablet TAKE 1 TABLET BY MOUTH DAILY 30 tablet 0  . levothyroxine (SYNTHROID,  LEVOTHROID) 150 MCG tablet TAKE 1 TABLET BY MOUTH DAILY BEFORE BREAKFAST 30 tablet 2  . LYRICA 75 MG capsule TAKE 1 CAPSULE BY MOUTH TWICE DAILY 60 capsule 5  . metFORMIN (GLUCOPHAGE) 1000 MG tablet TAKE 1 TABLET BY MOUTH 2 TIMES DAILY WITH A MEAL. 60 tablet 5  . Multiple Vitamin (MULTIVITAMIN WITH MINERALS) TABS tablet Take 1 tablet by mouth daily.    . olmMarland Kitchensartan (BENICAR) 20 MG tablet Take 1 tablet (20 mg total) by mouth daily. MUST SCHEDULE ANNUAL PHYSICAL FOR MORE REFILLS 30 tablet 1  . ONE TOUCH LANCETS MISC Use to check blood sugar three time a day     . pantoprazole (PROTONIX) 40 MG tablet TAKE 1 TABLET BY MOUTH DAILY. 90 tablet 3  . TRUETEST TEST test strip USE AS DIRECTED EACH DAY 100 each 0  . ALPRAZolam (XANAX) 0.5 MG tablet Take 1 tablet (0.5 mg total) by mouth 2 (two) times daily as needed for anxiety. (Patient not taking: Reported on 09/11/2014) 30 tablet 0  . clindamycin (CLEOCIN) 300 MG capsule Take 1 capsule (300 mg total) by mouth 3 (three) times daily. (Patient not taking: Reported on 09/11/2014) 30 capsule 0  . ipratropium (ATROVENT) 0.06 % nasal spray Place 2 sprays into both nostrils 4 (four) times daily. (Patient not taking: Reported on 09/11/2014) 15 mL 0  . meloxicam (MOBIC) 15 MG tablet Take 1  tablet (15 mg total) by mouth daily. (Patient not taking: Reported on 09/11/2014) 14 tablet 0  . oxyCODONE-acetaminophen (ROXICET) 5-325 MG per tablet Take 1-2 tablets by mouth every 4 (four) hours as needed for severe pain. (Patient not taking: Reported on 09/11/2014) 40 tablet 0  . sertraline (ZOLOFT) 50 MG tablet Take 1 tablet (50 mg total) by mouth daily. (Patient not taking: Reported on 09/11/2014) 30 tablet 3  . tamoxifen (NOLVADEX) 20 MG tablet Take 1 tablet (20 mg total) by mouth daily. (Patient not taking: Reported on 09/11/2014) 90 tablet 3  . traMADol (ULTRAM) 50 MG tablet 1-2 tabs po q 6 hr prn pain Maximum dose= 8 tablets per day (Patient not taking: Reported on 09/11/2014) 20  tablet 0  . [DISCONTINUED] Calcium Carbonate 1500 MG TABS Take 1,500 mg by mouth daily.       No current facility-administered medications for this visit.   Functional Status: In your present state of health, do you have any difficulty performing the following activities: 08/05/2014  Hearing? N  Vision? N  Difficulty concentrating or making decisions? N  Walking or climbing stairs? N  Dressing or bathing? N  Doing errands, shopping? N   Fall/Depression Screening: PHQ 2/9 Scores 08/05/2014 09/09/2013  PHQ - 2 Score 0 1    Assessment: 1.  Diabetes: Not at goal of less than 7%.  Patient reports compliance with all diabetes medications.  Patient has not had A1c monitored since April 2016.  Please consider obtaining updated A1c.   Patient appears motivated to improve diet at this time and to start walking around the neighborhood once to twice weekly.  .    2.  Blood pressure: at goal of less than 140/90.  3.  Medication compliance: Patient reports she continues to forget to take her Welchol and only takes it twice weekly.  Could consider changing Welchol to statin therapy for cholesterol to reduce pill burden.  Patient reports she was previously on rosuvastatin and denied any adverse events.    Plan: 1. Stacey Roberts will work on eating smaller portion sizes.   2. She will start walking the loop in her neighborhood 1-2x per week.   3. Will send Dr. Loanne Drilling a letter regarding patients noncompliance with Welchol.   4. She will follow up with Pharmacy in one month on November 18th, 2016.    [facility] CM Care Plan Problem One        Most Recent Value   Care Plan Problem One  Eating out too much   Role Documenting the Problem One  Clinical Pharmacist   Care Plan for Problem One  Active   [facility] CM Short Term Goal #1 (0-30 days)  Stacey Roberts will work on reducing portion sizes when eating out and when cooking at home over the next 30 days per patient report.     [facility] CM Short Term Goal #1 Start Date   11/10/14   Interventions for Short Term Goal #1  Discussed importance of diet and limiting carbohydrate intake for diabetes.    THN CM Short Term Goal #2 (0-30 days)  Stacey Roberts will make better food choices when eating out or at home.     THN CM Short Term Goal #2 Start Date  09/11/14   [facility] CM Short Term Goal #2 Met Date  11/10/14   Interventions for Short Term Goal #2  Patient reports working on portion sizes and has limited snacks in the home.     [facility] CM Care  Plan Problem Two        Most Recent Value   Care Plan Problem Two  Not exercising on a routine basis   Role Documenting the Problem Two  Clinical Pharmacist   Care Plan for Problem Two  Active   THN CM Short Term Goal #1 (0-30 days)  Stacey Roberts will start walking the loop in her neighborhood one to two times per week for the next 30 days per patient report.    [facility]N CM Short Term Goal #1 Start Date  11/10/14   Interventions for Short Term Goal #2   Encouraged exercise at work or at home and discussed importance of exercise on controlling blood sugars.    [facility]N CM Short Term Goal #2 (0-30 days)  Stacey Roberts will exercise (walking) for at least 30 minutes per day four days per week.     THN CM Short Term Goal #2 Start Date  09/11/14   [facility] CM Short Term Goal #2 Met Date  11/10/14 [goal not met]   Interventions for Short Term Goal #2  Encourage continue to walk on work breaks and at home,   Encouraged more consistency   THN CM Short Term Goal #3 (0-30 days)  Stacey Roberts will take the stairs to her second floor office instead of using the elevator.    THN CM Short Term Goal #3 Start Date  09/11/14   [facility] CM Short Term Goal #3 Met Date  11/10/14 [goal not met]   Interventions for Short Term Goal #3  I educated Stacey Roberts on how taking the stairs instead of the elevator to her second floor office would be a good way to increase daily activity.        Elisabeth Most, Pharm.D. Pharmacy Resident [place] [phone]

## 2014-11-09 NOTE — Telephone Encounter (Signed)
Patient called in to cancel her appointment and states she will call back to reschedule

## 2014-11-17 ENCOUNTER — Ambulatory Visit: Payer: 59 | Admitting: Oncology

## 2014-12-02 ENCOUNTER — Ambulatory Visit (INDEPENDENT_AMBULATORY_CARE_PROVIDER_SITE_OTHER): Payer: 59 | Admitting: Endocrinology

## 2014-12-02 ENCOUNTER — Encounter: Payer: Self-pay | Admitting: Endocrinology

## 2014-12-02 VITALS — BP 129/86 | HR 103 | Temp 98.0°F | Ht 64.0 in | Wt 256.0 lb

## 2014-12-02 DIAGNOSIS — E118 Type 2 diabetes mellitus with unspecified complications: Secondary | ICD-10-CM | POA: Diagnosis not present

## 2014-12-02 LAB — POCT GLYCOSYLATED HEMOGLOBIN (HGB A1C): HEMOGLOBIN A1C: 8.7

## 2014-12-02 MED ORDER — BROMOCRIPTINE MESYLATE 0.8 MG PO TABS: 1.0000 | ORAL_TABLET | Freq: Every day | ORAL | Status: DC

## 2014-12-02 NOTE — Progress Notes (Signed)
Subjective:    Patient ID: Stacey Roberts, female    DOB: [birth year], 49 y.o.   MRN: [id number]  HPI Pt returns for f/u of stage-1 papillary adenocarcinoma of the thyroid.    2/11: thyroidectomy: T1b N0 M0.  4/11: i-131 103 mci, with thyrogen.  12/11: neck US: single enlarged right cervical lymph node, nonspecific.   6/12: body scan (thyrogen) neg.   12/12: [place]: lymph node is stable 6/13: [place]: overall node morphology and volume show very little change.   9/14  TG undetectable (ab neg) 3/15: TG undetectable (ab neg) 10/15 TG undetectable (ab neg) She does not notice any lump in the neck.   Pt also returns for f/u of diabetes mellitus: DM type: 2 Dx'ed: 7948 Complications: painful neuropathy of the lower extremities Therapy: buetta and 4 oral meds GDM: never DKA: never Severe hypoglycemia: never Pancreatitis: never Other: she has never taken insulin; she declines weight-loss surgery; edema precudes pioglitizone rx.  Interval history: no cbg record, but states cbg's are still in the high-100's. She tolerates meds well, but she does not take the welchol.   Past Medical History  Diagnosis Date  . Diabetes mellitus, type II ([place])   . Obesity   . Hypertension   . Hyperlipidemia   . 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 Buddy Duty  . Bronchitis     hx of  . Hypothyroidism   . Snoring   . Sleep apnea 2008    severe OSA-does not use a cpap  . GERD (gastroesophageal reflux disease)     Past Surgical History  Procedure Laterality Date  . Tonsillectomy  1982  . Dilation and curettage of uterus  2004    s/p  . Thyroidectomy  02/2009    Dr Harlow Asa  . Breast biopsy  2000    s/p  . Breast lumpectomy Left 01/06/2013    Procedure: LUMPECTOMY;  Surgeon: Harl Bowie, MD;  Location: [place];  Service: General;  Laterality: Left;  . Breast biopsy Left 01/22/2013    Procedure:  RE-EXCISION LEFT BREAST DCIS;  Surgeon: Harl Bowie, MD;  Location: [place];  Service: General;  Laterality: Left;    Social History   Social History  . Marital Status: Single    Spouse Name: N/A  . Number of Children: N/A  . Years of Education: N/A   Occupational History  .  [employer]    Outpatient scheduling in radiology   Social History Main Topics  . Smoking status: Former Smoker    Types: Cigarettes  . Smokeless tobacco: Never Used     Comment: Smoked on and off for 20 years. Quit about 5 years ago.  . Alcohol Use: Yes  . Drug Use: No  . Sexual Activity: Yes   Other Topics Concern  . Not on file   Social History Narrative   The patient is divorced, moved to [place] in   2006 from [place].  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    No alcohol.  Tobacco use:  She quit 5 years ago.  She smoked on   and off for approximately 20 years.  No history of recreational drug   Use.Drinks two cups of coffee per work day.     Current Outpatient Prescriptions on File  Prior to Visit  Medication Sig Dispense Refill  . aspirin 81 MG tablet Take 81 mg by mouth daily.      . colesevelam (WELCHOL) 625 MG tablet Take 3 tablets (1,875 mg total) by mouth 2 (two) times daily with a meal. 540 tablet 11  . exenatide (BYETTA 10 MCG PEN) 10 MCG/0.04ML SOPN injection Inject 0.04 mLs (10 mcg total) into the skin 2 (two) times daily with a meal. And pen needles 2/day 2.4 mL 11  . glimepiride (AMARYL) 4 MG tablet Take 1 tablet (4 mg total) by mouth daily before breakfast. 90 tablet 3  . INVOKANA 300 MG TABS tablet TAKE 1 TABLET BY MOUTH DAILY 30 tablet 0  . levothyroxine (SYNTHROID, LEVOTHROID) 150 MCG tablet TAKE 1 TABLET BY MOUTH DAILY BEFORE BREAKFAST 30 tablet 2  . LYRICA 75 MG capsule TAKE 1 CAPSULE BY MOUTH TWICE DAILY 60 capsule 5  . metFORMIN (GLUCOPHAGE) 1000 MG  tablet TAKE 1 TABLET BY MOUTH 2 TIMES DAILY WITH A MEAL. 60 tablet 5  . Multiple Vitamin (MULTIVITAMIN WITH MINERALS) TABS tablet Take 1 tablet by mouth daily.    Marland Kitchen olmesartan (BENICAR) 20 MG tablet Take 1 tablet (20 mg total) by mouth daily. MUST SCHEDULE ANNUAL PHYSICAL FOR MORE REFILLS 30 tablet 1  . ONE TOUCH LANCETS MISC Use to check blood sugar three time a day     . pantoprazole (PROTONIX) 40 MG tablet TAKE 1 TABLET BY MOUTH DAILY. 90 tablet 3  . tamoxifen (NOLVADEX) 20 MG tablet Take 1 tablet (20 mg total) by mouth daily. 90 tablet 3  . traMADol (ULTRAM) 50 MG tablet 1-2 tabs po q 6 hr prn pain Maximum dose= 8 tablets per day 20 tablet 0  . TRUETEST TEST test strip USE AS DIRECTED EACH DAY 100 each 0  . ALPRAZolam (XANAX) 0.5 MG tablet Take 1 tablet (0.5 mg total) by mouth 2 (two) times daily as needed for anxiety. (Patient not taking: Reported on 09/11/2014) 30 tablet 0  . ipratropium (ATROVENT) 0.06 % nasal spray Place 2 sprays into both nostrils 4 (four) times daily. (Patient not taking: Reported on 12/02/2014) 15 mL 0  . meloxicam (MOBIC) 15 MG tablet Take 1 tablet (15 mg total) by mouth daily. (Patient not taking: Reported on 09/11/2014) 14 tablet 0  . oxyCODONE-acetaminophen (ROXICET) 5-325 MG per tablet Take 1-2 tablets by mouth every 4 (four) hours as needed for severe pain. (Patient not taking: Reported on 09/11/2014) 40 tablet 0  . [DISCONTINUED] Calcium Carbonate 1500 MG TABS Take 1,500 mg by mouth daily.       No current facility-administered medications on file prior to visit.    Allergies  Allergen Reactions  . Penicillins Shortness Of Breath and Rash  . Ace Inhibitors     REACTION: cough  . Clindamycin/Lincomycin Other (See Comments)    Severe stomach cramps    Family History  Problem Relation Age of Onset  . Dementia Father     deceased age 105 secondary to dementia  . Bipolar disorder Father   . Emphysema Father   . Heart disease Father   . CAD Mother 5    Died  age 44 of CAD  . Heart disease Mother   . CAD Maternal Grandfather 60    BP 129/86 mmHg  Pulse 103  Temp(Src) 98 F (36.7 C) (Oral)  Ht 5\' 4"  (1.626 m)  Wt 256 lb (116.121 kg)  BMI 43.92 kg/m2  SpO2 96%  Review of Systems She  has weight gain    Objective:   Physical Exam VITAL SIGNS:  See vs page GENERAL: no distress Pulses: dorsalis pedis intact bilat.   MSK: no deformity of the feet CV: no leg edema Skin:  no ulcer on the feet.  normal color and temp on the feet. Neuro: sensation is intact to touch on the feet   A1c=8.7%    Assessment & Plan:  DM: she needs increased rx.  She wants to avoid insulin for now.   Thyroid cancer: we'll recheck labs next time.   Patient is advised the following: Patient Instructions  check your blood sugar once a day.  vary the time of day when you check, between before the 3 meals, and at bedtime.  also check if you have symptoms of your blood sugar being too high or too low.  please keep a record of the readings and bring it to your next appointment here.  You can write it on any piece of paper.  please call us sooner if your blood sugar goes below 70, or if you have a lot of readings over 200.  Please come back for a follow-up appointment in 3 months.   Please start taking "bromocriptine," to help your blood sugar. It has possible side effects of nausea and dizziness.  These go away with time.  You can avoid these by taking it at bedtime.    Also, please re-try the welchol.  Please let me know if you decide to go with the insulin or weight loss surgery.

## 2014-12-02 NOTE — Patient Instructions (Addendum)
check your blood sugar once a day.  vary the time of day when you check, between before the 3 meals, and at bedtime.  also check if you have symptoms of your blood sugar being too high or too low.  please keep a record of the readings and bring it to your next appointment here.  You can write it on any piece of paper.  please call us sooner if your blood sugar goes below 70, or if you have a lot of readings over 200.  Please come back for a follow-up appointment in 3 months.   Please start taking "bromocriptine," to help your blood sugar. It has possible side effects of nausea and dizziness.  These go away with time.  You can avoid these by taking it at bedtime.    Also, please re-try the welchol.  Please let me know if you decide to go with the insulin or weight loss surgery.

## 2014-12-11 ENCOUNTER — Ambulatory Visit: Payer: 59 | Admitting: Pharmacist

## 2014-12-14 ENCOUNTER — Other Ambulatory Visit: Payer: Self-pay | Admitting: Endocrinology

## 2014-12-21 ENCOUNTER — Ambulatory Visit
Admission: EM | Admit: 2014-12-21 | Discharge: 2014-12-21 | Disposition: A | Discharge status: Home / Self Care | Payer: 59 | Attending: Family Medicine | Admitting: Family Medicine

## 2014-12-21 ENCOUNTER — Encounter: Payer: Self-pay | Admitting: Emergency Medicine

## 2014-12-21 DIAGNOSIS — B9789 Other viral agents as the cause of diseases classified elsewhere: Principal | ICD-10-CM

## 2014-12-21 DIAGNOSIS — R1031 Right lower quadrant pain: Secondary | ICD-10-CM

## 2014-12-21 DIAGNOSIS — J069 Acute upper respiratory infection, unspecified: Secondary | ICD-10-CM | POA: Diagnosis not present

## 2014-12-21 LAB — RAPID STREP SCREEN (MED CTR MEBANE ONLY): STREPTOCOCCUS, GROUP A SCREEN (DIRECT): NEGATIVE

## 2014-12-21 MED ORDER — GUAIFENESIN-CODEINE 100-10 MG/5ML PO SOLN: 10.0000 mL | Freq: Four times a day (QID) | ORAL | Status: DC | PRN

## 2014-12-21 NOTE — ED Notes (Signed)
Patient c/o cough and chest congestion since last Friday.

## 2014-12-21 NOTE — ED Provider Notes (Signed)
CSN: [id number]     Arrival date & time 12/21/14  1339 History   None    Chief Complaint  Patient presents with  . Cough   (Consider location/radiation/quality/duration/timing/severity/associated sxs/prior Treatment) Patient is a 49 y.o. female presenting with cough. The history is provided by the patient.  Cough Cough characteristics:  Productive Severity:  Mild Onset quality:  Sudden Duration:  4 days Timing:  Constant Progression:  Worsening Chronicity:  New Smoker: no   Context: upper respiratory infection   Relieved by:  Nothing Associated symptoms: rhinorrhea, sinus congestion and sore throat     Past Medical History  Diagnosis Date  . Diabetes mellitus, type II ([place])   . Obesity   . Hypertension   . Hyperlipidemia   . 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 Buddy Duty  . Bronchitis     hx of  . Hypothyroidism   . Snoring   . Sleep apnea 2008    severe OSA-does not use a cpap  . GERD (gastroesophageal reflux disease)    Past Surgical History  Procedure Laterality Date  . Tonsillectomy  1982  . Dilation and curettage of uterus  2004    s/p  . Thyroidectomy  02/2009    Dr Harlow Asa  . Breast biopsy  2000    s/p  . Breast lumpectomy Left 01/06/2013    Procedure: LUMPECTOMY;  Surgeon: Harl Bowie, MD;  Location: [place];  Service: General;  Laterality: Left;  . Breast biopsy Left 01/22/2013    Procedure: RE-EXCISION LEFT BREAST DCIS;  Surgeon: Harl Bowie, MD;  Location: [place];  Service: General;  Laterality: Left;   Family History  Problem Relation Age of Onset  . Dementia Father     deceased age 61 secondary to dementia  . Bipolar disorder Father   . Emphysema Father   . Heart disease Father   . CAD Mother 47    Died age 26 of CAD  . Heart disease Mother   . CAD Maternal Grandfather 33   Social History  Substance Use  Topics  . Smoking status: Former Smoker    Types: Cigarettes  . Smokeless tobacco: Never Used     Comment: Smoked on and off for 20 years. Quit about 5 years ago.  . Alcohol Use: Yes   OB History    No data available     Review of Systems  HENT: Positive for rhinorrhea and sore throat.   Respiratory: Positive for cough.     Allergies  Penicillins; Ace inhibitors; and Clindamycin/lincomycin  Home Medications   Prior to Admission medications   Medication Sig Start Date End Date Taking? Authorizing Provider  ALPRAZolam Duanne Moron) 0.5 MG tablet Take 1 tablet (0.5 mg total) by mouth 2 (two) times daily as needed for anxiety. Patient not taking: Reported on 09/11/2014 09/09/13   Jearld Fenton, NP  aspirin 81 MG tablet Take 81 mg by mouth daily.      Historical Provider, MD  Bromocriptine Mesylate 0.8 MG TABS Take 1 tablet (0.8 mg total) by mouth at bedtime. 12/02/14   Renato Shin, MD  colesevelam [facility]) 625 MG tablet Take 3 tablets (1,875 mg total) by mouth 2 (two) times daily with a meal. 02/04/14   Renato Shin, MD  exenatide (BYETTA 10 MCG PEN) 10 MCG/0.04ML SOPN injection Inject 0.04 mLs (10 mcg total) into the skin 2 (two) times  daily with a meal. And pen needles 2/day 09/01/14   Renato Shin, MD  glimepiride (AMARYL) 4 MG tablet Take 1 tablet (4 mg total) by mouth daily before breakfast. 05/12/14   Renato Shin, MD  guaiFENesin-codeine 100-10 MG/5ML syrup Take 10 mLs by mouth every 6 (six) hours as needed for cough. 12/21/14   Norval Gable, MD  INVOKANA 300 MG TABS tablet TAKE 1 TABLET BY MOUTH DAILY 12/14/14   Renato Shin, MD  ipratropium (ATROVENT) 0.06 % nasal spray Place 2 sprays into both nostrils 4 (four) times daily. Patient not taking: Reported on 12/02/2014 05/27/14   Lutricia Feil, PA  levothyroxine (SYNTHROID, LEVOTHROID) 150 MCG tablet TAKE 1 TABLET BY MOUTH DAILY BEFORE BREAKFAST 10/19/14   Renato Shin, MD  LYRICA 75 MG capsule TAKE 1 CAPSULE BY MOUTH TWICE DAILY  06/15/14   Max T Hyatt, DPM  meloxicam (MOBIC) 15 MG tablet Take 1 tablet (15 mg total) by mouth daily. Patient not taking: Reported on 09/11/2014 10/28/13   Janne Napoleon, NP  metFORMIN (GLUCOPHAGE) 1000 MG tablet TAKE 1 TABLET BY MOUTH 2 TIMES DAILY WITH A MEAL. 09/29/14   Renato Shin, MD  Multiple Vitamin (MULTIVITAMIN WITH MINERALS) TABS tablet Take 1 tablet by mouth daily.    Historical Provider, MD  olmesartan (BENICAR) 20 MG tablet Take 1 tablet (20 mg total) by mouth daily. MUST SCHEDULE ANNUAL PHYSICAL FOR MORE REFILLS 10/30/14   Jearld Fenton, NP  ONE TOUCH LANCETS MISC Use to check blood sugar three time a day     Historical Provider, MD  oxyCODONE-acetaminophen (ROXICET) 5-325 MG per tablet Take 1-2 tablets by mouth every 4 (four) hours as needed for severe pain. Patient not taking: Reported on 09/11/2014 09/09/13   Jearld Fenton, NP  pantoprazole (PROTONIX) 40 MG tablet TAKE 1 TABLET BY MOUTH DAILY. 04/20/14   Lucille Passy, MD  tamoxifen (NOLVADEX) 20 MG tablet Take 1 tablet (20 mg total) by mouth daily. 12/05/13   Chauncey Cruel, MD  traMADol (ULTRAM) 50 MG tablet 1-2 tabs po q 6 hr prn pain Maximum dose= 8 tablets per day 10/28/13   Janne Napoleon, NP  TRUETEST TEST test strip USE AS DIRECTED EACH DAY 10/19/14   Renato Shin, MD   Meds Ordered and Administered this Visit  Medications - No data to display  BP 123/56 mmHg  Pulse 97  Temp(Src) 97.7 F (36.5 C) (Tympanic)  Resp 16  Ht 5\' 4"  (1.626 m)  Wt 252 lb (114.306 kg)  BMI 43.23 kg/m2  SpO2 98% No data found.   Physical Exam  Constitutional: She appears well-developed and well-nourished. No distress.  HENT:  Head: Normocephalic and atraumatic.  Right Ear: Tympanic membrane, external ear and ear canal normal.  Left Ear: Tympanic membrane, external ear and ear canal normal.  Nose: Rhinorrhea present. No nose lacerations, sinus tenderness, nasal deformity, septal deviation or nasal septal hematoma. No epistaxis.  No foreign  bodies.  Mouth/Throat: Uvula is midline and mucous membranes are normal. Posterior oropharyngeal erythema present. No oropharyngeal exudate.  Eyes: Conjunctivae and EOM are normal. Pupils are equal, round, and reactive to light. Right eye exhibits no discharge. Left eye exhibits no discharge. No scleral icterus.  Neck: Normal range of motion. Neck supple. No thyromegaly present.  Cardiovascular: Normal rate, regular rhythm and normal heart sounds.   Pulmonary/Chest: Effort normal and breath sounds normal. No respiratory distress. She has no wheezes. She has no rales.  Lymphadenopathy:    She  has no cervical adenopathy.  Skin: No rash noted. She is not diaphoretic.  Nursing note and vitals reviewed.   ED Course  Procedures (including critical care time)  Labs Review Labs Reviewed  RAPID STREP SCREEN (NOT AT [facility])  CULTURE, GROUP A STREP ([facility] ONLY)    Imaging Review No results found.   Visual Acuity Review  Right Eye Distance:   Left Eye Distance:   Bilateral Distance:    Right Eye Near:   Left Eye Near:    Bilateral Near:         MDM   1. Viral URI with cough    Discharge Medication List as of 12/21/2014  3:47 PM    START taking these medications   Details  guaiFENesin-codeine 100-10 MG/5ML syrup Take 10 mLs by mouth every 6 (six) hours as needed for cough., Starting 12/21/2014, Until Discontinued, Print       1. Lab results (negative strep) and diagnosis reviewed with patient 2. rx as per orders above; reviewed possible side effects, interactions, risks and benefits  3. Recommend supportive treatment with rest, increased fluids, otc analgesics prn 4. Follow-up prn if symptoms worsen or don't improve    Norval Gable, MD 12/21/14 1557

## 2014-12-24 LAB — CULTURE, GROUP A STREP (THRC)

## 2014-12-25 ENCOUNTER — Telehealth: Payer: Self-pay | Admitting: Emergency Medicine

## 2014-12-25 NOTE — ED Notes (Signed)
Patient notified that her throat culture came back positive for Strep.  Z-pack was called into [facility] per Marylene Land, NP.  Patient was instructed to follow-up with her PCP or here if her symptoms do not improve or worsen.  Patient verbalized understanding.

## 2015-01-04 ENCOUNTER — Telehealth: Payer: Self-pay | Admitting: Internal Medicine

## 2015-01-04 NOTE — Telephone Encounter (Signed)
[facility]  Patient Name: Stacey Roberts  DOB: 1965/05/04    Initial Comment Caller states was is urg care on 11/28, tested for strep and came back positive, gave her a z pack, still having symptoms, very dizzy, still cough, stuffy and runny nose   Nurse Assessment  Nurse: Wayne Sever, RN, Tillie Rung Date/Time ([place] Time): 01/04/2015 10:23:27 AM  Confirm and document reason for call. If symptomatic, describe symptoms. ---Caller states she was seen on 11/28 at Urgent Care, she was sent home with cough syrup. She got a call from Urgent Care that her strep came back positive, she started the medication on 12/02, Zithromax, she finished that on 12/06. She is still not feeling well. She has had a few dizzy spells. Caller states she is having some dizziness this morning. Caller is congested and stuffy. Denies any current fever.  Has the patient traveled out of the country within the last 30 days? ---No  Does the patient have any new or worsening symptoms? ---Yes  Will a triage be completed? ---Yes  Related visit to physician within the last 2 weeks? ---Yes  Does the PT have any chronic conditions? (i.e. diabetes, asthma, etc.) ---Yes  List chronic conditions. ---Type 2 Diabetic  Did the patient indicate they were pregnant? ---No  Is this a behavioral health or substance abuse call? ---No     Guidelines    Guideline Title Affirmed Question Affirmed Notes  Sinus Pain or Congestion [1] Sinus congestion (pressure, fullness) AND [2] present > 10 days    Final Disposition User   See PCP When Office is Open (within 3 days) Wayne Sever, RN, [employer]    Referrals  REFERRED TO PCP OFFICE   Disagree/Comply: Leta Baptist

## 2015-01-04 NOTE — Telephone Encounter (Signed)
Pt has appt 01/05/15 at 8:45 with [name] NP.

## 2015-01-05 ENCOUNTER — Encounter: Payer: Self-pay | Admitting: Internal Medicine

## 2015-01-05 ENCOUNTER — Ambulatory Visit (INDEPENDENT_AMBULATORY_CARE_PROVIDER_SITE_OTHER)
Admission: RE | Admit: 2015-01-05 | Discharge: 2015-01-05 | Disposition: A | Discharge status: Home / Self Care | Payer: 59 | Source: Ambulatory Visit | Attending: Internal Medicine | Admitting: Internal Medicine

## 2015-01-05 ENCOUNTER — Ambulatory Visit (INDEPENDENT_AMBULATORY_CARE_PROVIDER_SITE_OTHER): Payer: 59 | Admitting: Internal Medicine

## 2015-01-05 ENCOUNTER — Other Ambulatory Visit: Payer: Self-pay | Admitting: Internal Medicine

## 2015-01-05 VITALS — BP 126/92 | HR 84 | Temp 98.4°F | Wt 249.0 lb

## 2015-01-05 DIAGNOSIS — J020 Streptococcal pharyngitis: Secondary | ICD-10-CM

## 2015-01-05 DIAGNOSIS — R0981 Nasal congestion: Secondary | ICD-10-CM | POA: Diagnosis not present

## 2015-01-05 DIAGNOSIS — R059 Cough, unspecified: Secondary | ICD-10-CM

## 2015-01-05 DIAGNOSIS — R05 Cough: Secondary | ICD-10-CM | POA: Diagnosis not present

## 2015-01-05 DIAGNOSIS — R0602 Shortness of breath: Secondary | ICD-10-CM

## 2015-01-05 MED ORDER — FLUTICASONE PROPIONATE 50 MCG/ACT NA SUSP: 2.0000 | Freq: Every day | NASAL | Status: DC

## 2015-01-05 MED ORDER — HYDROCODONE-HOMATROPINE 5-1.5 MG/5ML PO SYRP: 5.0000 mL | ORAL_SOLUTION | Freq: Three times a day (TID) | ORAL | Status: DC | PRN

## 2015-01-05 NOTE — Patient Instructions (Signed)

## 2015-01-05 NOTE — Progress Notes (Signed)
HPI  Pt presents to the clinic today with c/o cough and nasal congestion. She reports this started 3 weeks ago. She went to [facility] 11/28, was diagnosed with a viral illness, and given some cough syrup. They called her a few days later and told her that her strep culture came back positive. She was given a Zpack to take. She reports she has finished the course of antibiotics but continues to cough. The cough is productive of clear/yellow mucous. She is blowing clear/green mucous out of her nose. She denies fever, chills or body aches. She has not tried anything OTC. She does have a history of DM 2. Her sugars are < 200. She did have her flu shot this year. She is a former smoker.  Review of Systems    Past Medical History  Diagnosis Date  . Diabetes mellitus, type II ([place])   . Obesity   . Hypertension   . Hyperlipidemia   . 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 Buddy Duty  . Bronchitis     hx of  . Hypothyroidism   . Snoring   . Sleep apnea 2008    severe OSA-does not use a cpap  . GERD (gastroesophageal reflux disease)     Family History  Problem Relation Age of Onset  . Dementia Father     deceased age 83 secondary to dementia  . Bipolar disorder Father   . Emphysema Father   . Heart disease Father   . CAD Mother 40    Died age 58 of CAD  . Heart disease Mother   . CAD Maternal Grandfather 60    Social History   Social History  . Marital Status: Single    Spouse Name: N/A  . Number of Children: N/A  . Years of Education: N/A   Occupational History  .  [employer]    Outpatient scheduling in radiology   Social History Main Topics  . Smoking status: Former Smoker    Types: Cigarettes  . Smokeless tobacco: Never Used     Comment: Smoked on and off for 20 years. Quit about 5 years ago.  . Alcohol Use: Yes  . Drug Use: No  . Sexual Activity: Yes   Other Topics  Concern  . Not on file   Social History Narrative   The patient is divorced, moved to [place] in   2006 from [place].  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    No alcohol.  Tobacco use:  She quit 5 years ago.  She smoked on   and off for approximately 20 years.  No history of recreational drug   Use.Drinks two cups of coffee per work day.     Allergies  Allergen Reactions  . Penicillins Shortness Of Breath and Rash  . Ace Inhibitors     REACTION: cough  . Clindamycin/Lincomycin Other (See Comments)    Severe stomach cramps     Constitutional: Positive fatigue. Denies headache, fever or abrupt weight changes.  HEENT:  Positive nasal congestion and runny nose. Denies eye redness, ear pain, ringing in the ears, wax buildup or sore throat. Respiratory: Positive cough and shortness of breath. Denies difficulty breathing.  Cardiovascular: Denies chest pain, chest tightness, palpitations or swelling in the hands or feet.   No other specific  complaints in a complete review of systems (except as listed in HPI above).  Objective:  BP 126/92 mmHg  Pulse 84  Temp(Src) 98.4 F (36.9 C) (Oral)  Wt 249 lb (112.946 kg)  SpO2 98%   General: Appears her stated age, obese in NAD. HEENT: Head: normal shape and size, no sinus tenderness noted; Eyes: sclera white, no icterus, conjunctiva pink; Ears: Tm's pink but intact, normal light reflex, eczema noted of left ear canal; Nose: mucosa pink and moist, septum midline; Throat/Mouth: + PND. Teeth present, mucosa pink and moist, no exudate noted, no lesions or ulcerations noted.  Neck:  No adenopathy noted.  Cardiovascular: Normal rate and rhythm. S1,S2 noted.  No murmur, rubs or gallops noted.  Pulmonary/Chest: Normal effort and positive diminshed breath sounds. No respiratory distress. No wheezes, rales or ronchi noted.      Assessment & Plan:    Nasal congestion, cough and shortness of breath:  ? Allergy component Flonase 2 sprays each nostril for 3 days and then daily as needed. Start Zyrtec 10 mg daily at night x 2 weeks RX for Hycodan cough syrup Given persistent symptoms, will check chest xray to r/o pneumonia No indication for repeat abx unless xray shows infiltrate Work note probvided  Strep throat:  Seems to have resolved  RTC as needed or if symptoms persist.

## 2015-01-05 NOTE — Addendum Note (Signed)
Addended by: Lurlean Nanny on: 01/05/2015 10:10 AM   Modules accepted: Orders

## 2015-01-05 NOTE — Progress Notes (Signed)
Pre visit review using our clinic review tool, if applicable. No additional management support is needed unless otherwise documented below in the visit note. 

## 2015-01-06 NOTE — Addendum Note (Signed)
Addended by: Ellamae Sia on: 01/06/2015 02:29 PM   Modules accepted: Orders

## 2015-01-08 ENCOUNTER — Other Ambulatory Visit: Payer: Self-pay | Admitting: Pharmacist

## 2015-01-11 NOTE — Patient Outreach (Signed)
[facility]) Care Management  01/11/2015  Stacey Roberts 10/14/1965 [id number]   Patient did not show for appointment on 01/08/15.   Elisabeth Most, Pharm.D. Pharmacy Resident [place] [phone]

## 2015-01-15 ENCOUNTER — Ambulatory Visit: Payer: 59 | Admitting: Pharmacist

## 2015-01-19 ENCOUNTER — Other Ambulatory Visit: Payer: Self-pay | Admitting: Podiatry

## 2015-01-21 ENCOUNTER — Telehealth: Payer: Self-pay | Admitting: *Deleted

## 2015-01-21 NOTE — Telephone Encounter (Signed)
Pt request Lyrica 75 mg to be called to [facility].  Done.

## 2015-01-24 DIAGNOSIS — Z1371 Encounter for nonprocreative screening for genetic disease carrier status: Secondary | ICD-10-CM

## 2015-01-24 DIAGNOSIS — Z923 Personal history of irradiation: Secondary | ICD-10-CM

## 2015-01-24 HISTORY — DX: Encounter for nonprocreative screening for genetic disease carrier status: Z13.71

## 2015-01-24 HISTORY — PX: BREAST LUMPECTOMY: SHX2

## 2015-01-24 HISTORY — DX: Personal history of irradiation: Z92.3

## 2015-02-09 ENCOUNTER — Other Ambulatory Visit: Payer: Self-pay | Admitting: Oncology

## 2015-02-09 DIAGNOSIS — Z853 Personal history of malignant neoplasm of breast: Secondary | ICD-10-CM

## 2015-02-12 ENCOUNTER — Other Ambulatory Visit: Payer: Self-pay | Admitting: Endocrinology

## 2015-02-12 ENCOUNTER — Encounter: Payer: Self-pay | Admitting: Endocrinology

## 2015-02-15 ENCOUNTER — Ambulatory Visit
Admission: RE | Admit: 2015-02-15 | Discharge: 2015-02-15 | Disposition: A | Discharge status: Home / Self Care | Payer: 59 | Source: Ambulatory Visit | Attending: Oncology | Admitting: Oncology

## 2015-02-15 ENCOUNTER — Encounter: Payer: Self-pay | Admitting: Pharmacist

## 2015-02-15 ENCOUNTER — Other Ambulatory Visit: Payer: Self-pay | Admitting: Pharmacist

## 2015-02-15 DIAGNOSIS — R928 Other abnormal and inconclusive findings on diagnostic imaging of breast: Secondary | ICD-10-CM | POA: Diagnosis not present

## 2015-02-15 DIAGNOSIS — Z853 Personal history of malignant neoplasm of breast: Secondary | ICD-10-CM

## 2015-02-15 NOTE — Patient Outreach (Signed)
[facility]) Care Management  [place]   02/15/2015  Stacey Roberts 1965/09/19 [id number]   Subjective: Patient presents today for diabetes follow-up as part of the employer-sponsored Link to Wellness program.  Current diabetes regimen includes metformin 1046m BID, glimepiride 436mdaily, canagliflozin 30085maily, and Byetta 43m68mID.  Patient also continues on daily ASA and ARB.  Patient is currently prescribed Welchol for cholesterol, but patient reports she continues to forgets to take it due to pill burden and having to separate it from her levothyroxine.  Most recent MD follow-up was on 12/02/14.  Patient has a pending appt for 03/05/15.  Patient was initiated on bromocriptine at most recent MD follow-up but reports she did not start the medication as it was not covered by her insurance.  Patient reports she informed her provider that she did not start medication.  Patient also reports noncompliance with Byetta over the past few weeks due to often forgetting to take medication and nausea.    Patient reports she continues to eat out a lot since her last visit, but she recently started Weight Watchers program one week ago.  She is now tracking everything she eats using weight watchers point system.  Patient reports she has increased exercise and is now walking approximately four days per week for 30 minutes each day.  Patient bought a fitbit last week and is tracking her steps and has set goal to walk 5000 steps per day.  Her most recent A1c was 8.7%.  She reports currently monitoring her blood glucose 1 to 2 times per week.  She did not bring glucometer to visit today but reports her blood glucose this morning was 227mg74m  She reports her blood glucose usually runs in the high 100s.  She denies hypoglycemia.    Objective:  Blood pressure: 140/104  Current Medications: Current Outpatient Prescriptions  Medication Sig Dispense Refill  . aspirin 81 MG tablet Take 81 mg  by mouth daily.      . BENMarland KitchenCAR 20 MG tablet TAKE 1 TABLET BY MOUTH DAILY. MUST SCHEDULE ANNUAL PHYSICAL FOR MORE REFILLS 30 tablet 2  . glimepiride (AMARYL) 4 MG tablet Take 1 tablet (4 mg total) by mouth daily before breakfast. 90 tablet 3  . INVOKANA 300 MG TABS tablet TAKE 1 TABLET BY MOUTH DAILY 30 tablet 1  . levothyroxine (SYNTHROID, LEVOTHROID) 150 MCG tablet TAKE 1 TABLET BY MOUTH DAILY BEFORE BREAKFAST 30 tablet 3  . LYRICA 75 MG capsule TAKE 1 CAPSULE BY MOUTH TWICE A DAY 60 capsule 11  . metFORMIN (GLUCOPHAGE) 1000 MG tablet TAKE 1 TABLET BY MOUTH 2 TIMES DAILY WITH A MEAL. 60 tablet 5  . Multiple Vitamin (MULTIVITAMIN WITH MINERALS) TABS tablet Take 1 tablet by mouth daily.    . ONE TOUCH LANCETS MISC Use to check blood sugar three time a day     . pantoprazole (PROTONIX) 40 MG tablet TAKE 1 TABLET BY MOUTH DAILY. 90 tablet 3  . TRUETEST TEST test strip USE AS DIRECTED EACH DAY 100 each 0  . colesevelam (WELCHOL) 625 MG tablet Take 3 tablets (1,875 mg total) by mouth 2 (two) times daily with a meal. (Patient not taking: Reported on 02/15/2015) 540 tablet 11  . exenatide (BYETTA 10 MCG PEN) 10 MCG/0.04ML SOPN injection Inject 0.04 mLs (10 mcg total) into the skin 2 (two) times daily with a meal. And pen needles 2/day (Patient not taking: Reported on 02/15/2015) 2.4 mL 11  . fluticasone (FLONASE)  50 MCG/ACT nasal spray Place 2 sprays into both nostrils daily. (Patient not taking: Reported on 02/15/2015) 16 g 1  . HYDROcodone-homatropine (HYCODAN) 5-1.5 MG/5ML syrup Take 5 mLs by mouth every 8 (eight) hours as needed for cough. (Patient not taking: Reported on 02/15/2015) 240 mL 0  . tamoxifen (NOLVADEX) 20 MG tablet Take 1 tablet (20 mg total) by mouth daily. (Patient not taking: Reported on 02/15/2015) 90 tablet 3  . [DISCONTINUED] Calcium Carbonate 1500 MG TABS Take 1,500 mg by mouth daily.       No current facility-administered medications for this visit.    Functional Status: In your  present state of health, do you have any difficulty performing the following activities: 02/15/2015 08/05/2014  Hearing? N N  Vision? N N  Difficulty concentrating or making decisions? N N  Walking or climbing stairs? N N  Dressing or bathing? N N  Doing errands, shopping? N N    Fall/Depression Screening: PHQ 2/9 Scores 02/15/2015 08/05/2014 09/09/2013  PHQ - 2 Score 0 0 1    Assessment: 1.  Diabetes: Most recent A1C was 8.7% which is exceeding goal of less than 7%. Weight is decreased from last visit with me by 6 pounds.  Patient is motivated to improve diet and exercise at this time and has recently started Weight Watchers diet and is tracking her exercise using a fitbit.    2.  Blood pressure: not at goal of less than 140/90.  Current regimen includes olmesartan 62m daily.  Patient reports she did not take her blood pressure medication today or yesterday.  Patient plans to pick up medication refill from the pharmacy today.    3.  Medication compliance:   Patient reports noncompliance with several prescribed medications including Welchol, Byetta, and bromocriptine.  Patient reports she continues to forget to take her Welchol due to pill burden and having to separate it from her levothyroxine.  Could consider changing Welchol to statin therapy for cholesterol to reduce pill burden.  Patient reports she was previously on rosuvastatin and denied any adverse effects.  Patient also reports recent noncompliance with Byetta over the past few weeks due to often forgetting to take the medication and associated nausea.  Counseled patient that nausea associated with Byetta usually decreases over time if she is compliant with the medication.  However, conuseled patient to alert her provider if nausea persists.  Lastly, patient was initiated on bromocriptine at most recent MD follow-up but reports she did not start the medication as it was not covered by her insurance.  Patient reports she has informed Dr.  ELoanne Drillingthat she did not start bromocriptine.   Plan/Goals for Next Visit: 1.  Patient will pick up medication refill of olmesartan from the pharmacy today and will take all medications as prescribed.   2.  Patient will continue weight watchers program.   3.  Patient will begin swimming one to two times per week in addition to walking 4 days per week for approximately 30 minutes per day.  4.  Provided patient with EMMI materials about Type 2 Diabetes Diet, Diabetes: Controlling Blood Sugar, and Type 2 Diabetes: Food and Exercise.  Patient will review materials prior to next visit.   5.  Will send barriers letter to Dr. ELoanne Drillingregarding patient's noncompliance with her medications.   6.  Next appointment to see me is:  03/19/15 at 4:00 PM  [facility]        Most Recent Value  Care Plan Problem One  Eating out too much   Role Documenting the Problem One  Clinical Pharmacist   Care Plan for Problem One  Active   THN CM Short Term Goal #1 (0-30 days)  Ms. Kernen will work on reducing portion sizes when eating out and when cooking at home over the next 30 days per patient report.     THN CM Short Term Goal #1 Start Date  11/10/14   [facility] CM Short Term Goal #1 Met Date  02/15/15 Barrie Folk met - patient has joined weight watchers]   Interventions for Short Term Goal #1  Discussed importance of diet and limiting carbohydrate intake for diabetes.    THN CM Short Term Goal #2 (0-30 days)  Ms. Buckman will make better food choices when eating out or at home.     THN CM Short Term Goal #2 Start Date  09/11/14   [facility] CM Short Term Goal #2 Met Date  11/10/14   Interventions for Short Term Goal #2  Patient reports working on portion sizes and has limited snacks in the home.    THN CM Short Term Goal #3 (0-30 days)  Patient will continue Weight Watchers program for the next 30 days per patient report.    THN CM Short Term Goal #3 Start Date  02/15/15   Interventions for Short Tern Goal #3   Provided patient educational materials about Type 2 Diabetes Diet and Type 2 Diabetes: Food and Exercise.     [facility] CM Care Plan Problem Two        Most Recent Value   Care Plan Problem Two  Not exercising on a routine basis   Role Documenting the Problem Two  Clinical Pharmacist   Care Plan for Problem Two  Active   THN CM Short Term Goal #1 (0-30 days)  Ms. Hellickson will start walking the loop in her neighborhood one to two times per week for the next 30 days per patient report.    THN CM Short Term Goal #1 Start Date  11/10/14   [facility] CM Short Term Goal #1 Met Date   02/15/15 Barrie Folk met - patient reports walking four days per week ]   Interventions for Short Term Goal #2   Encouraged exercise at work or at home and discussed importance of exercise on controlling blood sugars.    THN CM Short Term Goal #2 (0-30 days)  Ms. Duskin will exercise (walking) for at least 30 minutes per day four days per week.     THN CM Short Term Goal #2 Start Date  09/11/14   [facility] CM Short Term Goal #2 Met Date  11/10/14 [goal not met]   Interventions for Short Term Goal #2  Encourage continue to walk on work breaks and at home,   Encouraged more consistency   THN CM Short Term Goal #3 (0-30 days)  Ms. Jaquith will take the stairs to her second floor office instead of using the elevator.    THN CM Short Term Goal #3 Start Date  09/11/14   [facility] CM Short Term Goal #3 Met Date  11/10/14 [goal not met]   Interventions for Short Term Goal #3  I educated Ms. Cromwell on how taking the stairs instead of the elevator to her second floor office would be a good way to increase daily activity.    THN CM Short Term Goal #4 (0-30 days)  Patient will start swimming at the gym 1 to 2 days per  week in addition to walking four days per week in the next 30 days per patient report.    THN CM Short Term Goal #4 Start Date  02/15/15   Interventions of Short Term Goal #4  Provided patient EMMI material about Type 2 Diabetes: Food and  Exercise and discussed ADA recommendations to exercise for 150 minutes per week.      Elisabeth Most, Pharm.D. Pharmacy Resident [place] [phone]

## 2015-02-18 ENCOUNTER — Encounter: Payer: Self-pay | Admitting: Internal Medicine

## 2015-03-05 ENCOUNTER — Encounter: Payer: Self-pay | Admitting: Endocrinology

## 2015-03-05 ENCOUNTER — Ambulatory Visit (INDEPENDENT_AMBULATORY_CARE_PROVIDER_SITE_OTHER): Payer: 59 | Admitting: Endocrinology

## 2015-03-05 VITALS — BP 132/84 | HR 88 | Temp 97.9°F | Ht 64.0 in | Wt 250.0 lb

## 2015-03-05 DIAGNOSIS — E118 Type 2 diabetes mellitus with unspecified complications: Secondary | ICD-10-CM

## 2015-03-05 LAB — POCT GLYCOSYLATED HEMOGLOBIN (HGB A1C): Hemoglobin A1C: 10.7

## 2015-03-05 MED ORDER — INSULIN GLARGINE 100 UNIT/ML SOLOSTAR PEN: 30.0000 [IU] | PEN_INJECTOR | SUBCUTANEOUS | Status: DC

## 2015-03-05 MED ORDER — INSULIN GLARGINE 100 UNIT/ML SOLOSTAR PEN: 30.0000 [IU] | PEN_INJECTOR | Freq: Every day | SUBCUTANEOUS | Status: DC

## 2015-03-05 NOTE — Patient Instructions (Addendum)
check your blood sugar once a day.  vary the time of day when you check, between before the 3 meals, and at bedtime.  also check if you have symptoms of your blood sugar being too high or too low.  please keep a record of the readings and bring it to your next appointment here.  You can write it on any piece of paper.  please call us sooner if your blood sugar goes below 70, or if you have a lot of readings over 200.  Please come back for a follow-up appointment in 1 month.   i have sent a prescription to your pharmacy, for the insulin.   You can continue the byetta if you wish.   Please call a few days after starting the insulin, to tell us how the blood sugar is doing.   With time, we'll stop the other diabetes meds.

## 2015-03-05 NOTE — Progress Notes (Signed)
Subjective:    Patient ID: Stacey Roberts, female    DOB: 1965/09/23, 50 y.o.   MRN: [id number]  HPI Pt returns for f/u of stage-1 papillary adenocarcinoma of the thyroid.    2/11: thyroidectomy: T1b N0 M0.  4/11: i-131 103 mci, with thyrogen.  12/11: neck US: single enlarged right cervical lymph node, nonspecific.   6/12: body scan (thyrogen) neg.   12/12: [place]: lymph node is stable 6/13: [place]: overall node morphology and volume show very little change.   9/14  TG undetectable (ab neg) 3/15: TG undetectable (ab neg) 10/15 TG undetectable (ab neg) She does not notice any lump in the neck.   Pt also returns for f/u of diabetes mellitus: DM type: 2 Dx'ed: [id number] Complications: painful neuropathy of the lower extremities Therapy: byetta and 4 oral meds.   GDM: never DKA: never Severe hypoglycemia: never.   Pancreatitis: never Other: she has never taken insulin, but she knows how, as she takes byetta; she declines weight-loss surgery; edema precudes pioglitizone rx.  Interval history: no cbg record, but states cbg's are still in the high-100's. She tolerates meds well, but she does not take the byetta.   Past Medical History  Diagnosis Date  . Diabetes mellitus, type II ([place])   . Obesity   . Hypertension   . Hyperlipidemia   . 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 Buddy Duty  . Bronchitis     hx of  . Hypothyroidism   . Snoring   . Sleep apnea 2008    severe OSA-does not use a cpap  . GERD (gastroesophageal reflux disease)     Past Surgical History  Procedure Laterality Date  . Tonsillectomy  1982  . Dilation and curettage of uterus  2004    s/p  . Thyroidectomy  02/2009    Dr Harlow Asa  . Breast biopsy  2000    s/p  . Breast lumpectomy Left 01/06/2013    Procedure: LUMPECTOMY;  Surgeon: Harl Bowie, MD;  Location: [place];  Service: General;  Laterality: Left;  .  Breast biopsy Left 01/22/2013    Procedure: RE-EXCISION LEFT BREAST DCIS;  Surgeon: Harl Bowie, MD;  Location: [facility];  Service: General;  Laterality: Left;    Social History   Social History  . Marital Status: Single    Spouse Name: N/A  . Number of Children: N/A  . Years of Education: N/A   Occupational History  .  [employer]    Outpatient scheduling in radiology   Social History Main Topics  . Smoking status: Former Smoker    Types: Cigarettes  . Smokeless tobacco: Never Used     Comment: Smoked on and off for 20 years. Quit about 5 years ago.  . Alcohol Use: Yes  . Drug Use: No  . Sexual Activity: Yes   Other Topics Concern  . Not on file   Social History Narrative   The patient is divorced, moved to [place] in   2006 from [place].  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    No alcohol.  Tobacco use:  She quit 5 years ago.  She smoked on   and off for approximately 20 years.  No history of recreational drug   Use.Drinks two cups of coffee  per work day.     Current Outpatient Prescriptions on File Prior to Visit  Medication Sig Dispense Refill  . aspirin 81 MG tablet Take 81 mg by mouth daily.      Marland Kitchen BENICAR 20 MG tablet TAKE 1 TABLET BY MOUTH DAILY. MUST SCHEDULE ANNUAL PHYSICAL FOR MORE REFILLS 30 tablet 2  . colesevelam (WELCHOL) 625 MG tablet Take 3 tablets (1,875 mg total) by mouth 2 (two) times daily with a meal. 540 tablet 11  . exenatide (BYETTA 10 MCG PEN) 10 MCG/0.04ML SOPN injection Inject 0.04 mLs (10 mcg total) into the skin 2 (two) times daily with a meal. And pen needles 2/day 2.4 mL 11  . glimepiride (AMARYL) 4 MG tablet Take 1 tablet (4 mg total) by mouth daily before breakfast. 90 tablet 3  . INVOKANA 300 MG TABS tablet TAKE 1 TABLET BY MOUTH DAILY 30 tablet 1  . levothyroxine (SYNTHROID, LEVOTHROID) 150 MCG tablet TAKE 1  TABLET BY MOUTH DAILY BEFORE BREAKFAST 30 tablet 3  . LYRICA 75 MG capsule TAKE 1 CAPSULE BY MOUTH TWICE A DAY 60 capsule 11  . metFORMIN (GLUCOPHAGE) 1000 MG tablet TAKE 1 TABLET BY MOUTH 2 TIMES DAILY WITH A MEAL. 60 tablet 5  . Multiple Vitamin (MULTIVITAMIN WITH MINERALS) TABS tablet Take 1 tablet by mouth daily.    . ONE TOUCH LANCETS MISC Use to check blood sugar three time a day     . pantoprazole (PROTONIX) 40 MG tablet TAKE 1 TABLET BY MOUTH DAILY. 90 tablet 3  . tamoxifen (NOLVADEX) 20 MG tablet Take 1 tablet (20 mg total) by mouth daily. 90 tablet 3  . TRUETEST TEST test strip USE AS DIRECTED EACH DAY 100 each 0  . [DISCONTINUED] Calcium Carbonate 1500 MG TABS Take 1,500 mg by mouth daily.       No current facility-administered medications on file prior to visit.    Allergies  Allergen Reactions  . Penicillins Shortness Of Breath and Rash  . Ace Inhibitors     REACTION: cough  . Clindamycin/Lincomycin Other (See Comments)    Severe stomach cramps    Family History  Problem Relation Age of Onset  . Dementia Father     deceased age 14 secondary to dementia  . Bipolar disorder Father   . Emphysema Father   . Heart disease Father   . CAD Mother 65    Died age 51 of CAD  . Heart disease Mother   . CAD Maternal Grandfather 60    BP 132/84 mmHg  Pulse 88  Temp(Src) 97.9 F (36.6 C) (Oral)  Ht 5\' 4"  (1.626 m)  Wt 250 lb (113.399 kg)  BMI 42.89 kg/m2  SpO2 96%  Review of Systems She denies hypoglycemia    Objective:   Physical Exam VITAL SIGNS:  See vs page GENERAL: no distress Neck: a healed scar is present.  i do not appreciate a nodule in the thyroid or elsewhere in the neck. Pulses: dorsalis pedis intact bilat.   MSK: no deformity of the feet CV: no leg edema Skin:  no ulcer on the feet.  normal color and temp on the feet. Neuro: sensation is intact to touch on the feet   A1c=10.7%    Assessment & Plan:  DM: glycemic control is worse.  We discussed  risks.  She agrees to take insulin.  She declines multiple daily injections.  Differentiated thyroid cancer: no clinical evidence of recurrence.    Patient is advised the following: Patient  Instructions  check your blood sugar once a day.  vary the time of day when you check, between before the 3 meals, and at bedtime.  also check if you have symptoms of your blood sugar being too high or too low.  please keep a record of the readings and bring it to your next appointment here.  You can write it on any piece of paper.  please call us sooner if your blood sugar goes below 70, or if you have a lot of readings over 200.  Please come back for a follow-up appointment in 1 month.   i have sent a prescription to your pharmacy, for the insulin.   You can continue the byetta if you wish.   Please call a few days after starting the insulin, to tell us how the blood sugar is doing.   With time, we'll stop the other diabetes meds.

## 2015-03-17 ENCOUNTER — Other Ambulatory Visit: Payer: Self-pay | Admitting: Endocrinology

## 2015-03-19 ENCOUNTER — Encounter: Payer: Self-pay | Admitting: Pharmacist

## 2015-03-19 ENCOUNTER — Other Ambulatory Visit: Payer: Self-pay | Admitting: Pharmacist

## 2015-03-19 NOTE — Patient Outreach (Signed)
[facility]) Care Management  [place]   03/19/2015  LAVELL RIDINGS [birth year] [id number]  Subjective: Patient presents today for diabetes follow-up as part of the employer-sponsored Link to Wellness program.  Patient reports adherence with diabetes medications.  Current diabetes regimen includes metformin 1065m BID, glimepiride 4 mg daily, canagliflozin 300 mg daily, Byetta 10 mcg BID, and Lantus 30 units daily.  Patient also continues on daily ASA and ARB.  Patient is currently prescribed Welchol for cholesterol, but patient reports non-adherence due to pill burden and having to separate it from other medications.    Most recent MD follow-up was 03/05/15.  Patient has a pending appt for 04/02/15.  Most recent A1c was 10.7% on  03/05/15, worsened from previous A1c of 8.7% on 12/02/14.   Patient was initiated on insulin at most recent MD follow up.  Patient reports monitoring blood glucose once daily.  Blood glucose this morning was 178 mg/dL with a 7-day average of 168 mg/dL.  Patient denies hypoglycemia.   Patient reports she continues with Weight Watchers program.  Patient reports she does well with her diet during the week but reports eating out a lot on the weekends.  Patient also reports she continues to snack a lot on high carbohydrate snacks such as chips.  Patient reports she continues to walk during breaks at work.  She reports interest in swimming but reports she has only gone swimming once since last visit.  She set goal to walk 5000 steps per day and reports average number of steps most days is 2000-4000 steps.     Objective:  Blood pressure = 104/79  Blood glucose: 7-day average = 168 mg/dL 14-day average = 176 mg/dL 30-day average = 188 mg/dL  Current Medications: Current Outpatient Prescriptions  Medication Sig Dispense Refill  . aspirin 81 MG tablet Take 81 mg by mouth daily.      .Marland KitchenBENICAR 20 MG tablet TAKE 1 TABLET BY MOUTH DAILY. MUST SCHEDULE  ANNUAL PHYSICAL FOR MORE REFILLS 30 tablet 2  . exenatide (BYETTA 10 MCG PEN) 10 MCG/0.04ML SOPN injection Inject 0.04 mLs (10 mcg total) into the skin 2 (two) times daily with a meal. And pen needles 2/day 2.4 mL 11  . glimepiride (AMARYL) 4 MG tablet Take 1 tablet (4 mg total) by mouth daily before breakfast. 90 tablet 3  . Insulin Glargine (LANTUS SOLOSTAR) 100 UNIT/ML Solostar Pen Inject 30 Units into the skin every morning. And pen needles 1/day 5 pen PRN  . levothyroxine (SYNTHROID, LEVOTHROID) 150 MCG tablet TAKE 1 TABLET BY MOUTH DAILY BEFORE BREAKFAST 30 tablet 3  . LYRICA 75 MG capsule TAKE 1 CAPSULE BY MOUTH TWICE A DAY 60 capsule 11  . metFORMIN (GLUCOPHAGE) 1000 MG tablet TAKE 1 TABLET BY MOUTH 2 TIMES DAILY WITH A MEAL. 60 tablet 5  . Multiple Vitamin (MULTIVITAMIN WITH MINERALS) TABS tablet Take 1 tablet by mouth daily.    . ONE TOUCH LANCETS MISC Use to check blood sugar three time a day     . pantoprazole (PROTONIX) 40 MG tablet TAKE 1 TABLET BY MOUTH DAILY. 90 tablet 3  . TRUETEST TEST test strip USE AS DIRECTED EACH DAY 100 each 0  . colesevelam (WELCHOL) 625 MG tablet Take 3 tablets (1,875 mg total) by mouth 2 (two) times daily with a meal. (Patient not taking: Reported on 03/19/2015) 540 tablet 11  . INVOKANA 300 MG TABS tablet TAKE 1 TABLET BY MOUTH DAILY 30 tablet 2  .  tamoxifen (NOLVADEX) 20 MG tablet Take 1 tablet (20 mg total) by mouth daily. (Patient not taking: Reported on 03/19/2015) 90 tablet 3  . [DISCONTINUED] Calcium Carbonate 1500 MG TABS Take 1,500 mg by mouth daily.       No current facility-administered medications for this visit.   Functional Status: In your present state of health, do you have any difficulty performing the following activities: 02/15/2015 08/05/2014  Hearing? N N  Vision? N N  Difficulty concentrating or making decisions? N N  Walking or climbing stairs? N N  Dressing or bathing? N N  Doing errands, shopping? N N   Fall/Depression  Screening: PHQ 2/9 Scores 02/15/2015 08/05/2014 09/09/2013  PHQ - 2 Score 0 0 1    Assessment: 1.  Diabetes: Most recent A1C was 10.7% which is exceeding goal of less than 7%.  Patient reports adherence with diabetes regimen including newly initiated insulin.  Patient reports she takes Lantus 30 units as directed but reports she will occasionally increase dose to 40 units if blood glucose is in the high 100s.  Counseled patient on importance of taking mediations as directed.  Reviewed purpose, proper use, and adverse effects of insulin including hypoglycemia and weight gain.  Patient denies hypoglycemic events and is able to verbalize appropriate hypoglycemia management plan.  Patient continues to be motivated to improve diet and exercise.    2.  Blood pressure: at goal of less than 140/90.  Current regimen includes olmesartan 20 mg daily and patient reports adherence with regimen.    3.  Medication adherence: Patient reports she continues to be nonadherent with Welchol due to pill burden and having to separate it from her other medications.  Patient's estimated 10-year ASCVD risk is 3.7%.  Per 2013 ACC/AHA cholesterol guidelines, moderate intensity statin therapy is indicated in this patient.  Could consider changing Welchol to statin therapy for cholesterol to reduce bill burden.  Patient reports she was previously on rosuvastatin and denied any adverse effects.      Plan: 1.  Patient to take all medications as prescribed.  2.  Patient will continue weight watchers program and set goal to eat healthier snacks.   3.  Patient will try to start swimming one to two times per week in addition to walking 4 days per week for approximately 30 minutes per day.   4.  Provided patient with EMMI materials about "Diabetes: Insulin Basics" and Type 2 Diabetes: Medication."  Patient will review materials prior to next visit.  5.  Follow up appointment on 04/16/15 at 3:30 PM  [facility] CM Care Plan Problem One         Most Recent Value   Care Plan Problem One  Eating out too much   Role Documenting the Problem One  Clinical Pharmacist   Care Plan for Problem One  Active   THN CM Short Term Goal #3 (0-30 days)  Patient will continue Weight Watchers program for the next 30 days per patient report.    THN CM Short Term Goal #3 Start Date  03/19/15   THN CM Short Term Goal #3 Met Date  -- [renewed goal]   Interventions for Short Tern Goal #3  Discussed educational materials about Type 2 Diabetes Diet and Type 2 Diabetes: Food and Exercise. Encouraged patient to continue weight waters program and continue to log food.  Patient also set goal to work on eating healtier snacks.  Discussed recommendation for snack to contain 15 grams of carbohydrates.  THN CM Care Plan Problem Two        Most Recent Value   Care Plan Problem Two  Not exercising on a routine basis   Role Documenting the Problem Two  Clinical Pharmacist   Care Plan for Problem Two  Active   THN CM Short Term Goal #2 Met Date  -- [goal not met]   THN CM Short Term Goal #4 (0-30 days)  Patient will start swimming at the gym 1 to 2 days per week in addition to walking four days per week in the next 30 days per patient report.    THN CM Short Term Goal #4 Start Date  03/19/15   THN CM Short Term Goal #4 Met Date  -- [renewed goal]   Interventions of Short Term Goal #4  Reviewed ADA recommendations to exercise 150 mintues per week.  Patient has used fitbit to track number of steps walked per day.      Elisabeth Most, Pharm.D. Pharmacy Resident [place] [phone]

## 2015-03-22 ENCOUNTER — Other Ambulatory Visit: Payer: Self-pay

## 2015-03-22 ENCOUNTER — Ambulatory Visit (AMBULATORY_SURGERY_CENTER): Payer: Self-pay | Admitting: *Deleted

## 2015-03-22 ENCOUNTER — Encounter: Payer: Self-pay | Admitting: Endocrinology

## 2015-03-22 VITALS — Ht 64.0 in | Wt 257.0 lb

## 2015-03-22 DIAGNOSIS — Z1211 Encounter for screening for malignant neoplasm of colon: Secondary | ICD-10-CM

## 2015-03-22 MED ORDER — CANAGLIFLOZIN 300 MG PO TABS: 300.0000 mg | ORAL_TABLET | Freq: Every day | ORAL | Status: DC

## 2015-03-22 MED ORDER — NA SULFATE-K SULFATE-MG SULF 17.5-3.13-1.6 GM/177ML PO SOLN: 1.0000 | Freq: Once | ORAL | Status: DC

## 2015-03-22 NOTE — Progress Notes (Signed)
No egg or soy allergy known to patient  No issues with past sedation with any surgeries  or procedures, no intubation problems  No diet pills per patient No home 02 use per patient  No blood thinners per patient    

## 2015-03-25 DIAGNOSIS — Z01411 Encounter for gynecological examination (general) (routine) with abnormal findings: Secondary | ICD-10-CM | POA: Diagnosis not present

## 2015-03-29 ENCOUNTER — Other Ambulatory Visit: Payer: Self-pay | Admitting: Endocrinology

## 2015-04-02 ENCOUNTER — Ambulatory Visit (INDEPENDENT_AMBULATORY_CARE_PROVIDER_SITE_OTHER): Payer: 59 | Admitting: Endocrinology

## 2015-04-02 ENCOUNTER — Encounter: Payer: Self-pay | Admitting: Endocrinology

## 2015-04-02 VITALS — BP 126/70 | HR 90 | Temp 98.7°F | Ht 64.0 in

## 2015-04-02 DIAGNOSIS — C73 Malignant neoplasm of thyroid gland: Secondary | ICD-10-CM

## 2015-04-02 DIAGNOSIS — E890 Postprocedural hypothyroidism: Secondary | ICD-10-CM | POA: Diagnosis not present

## 2015-04-02 DIAGNOSIS — E1142 Type 2 diabetes mellitus with diabetic polyneuropathy: Secondary | ICD-10-CM

## 2015-04-02 DIAGNOSIS — Z794 Long term (current) use of insulin: Secondary | ICD-10-CM

## 2015-04-02 LAB — MICROALBUMIN / CREATININE URINE RATIO
CREATININE, U: 79.7 mg/dL
MICROALB UR: 0.7 mg/dL (ref 0.0–1.9)
MICROALB/CREAT RATIO: 0.9 mg/g (ref 0.0–30.0)

## 2015-04-02 LAB — TSH: TSH: 0.43 u[IU]/mL (ref 0.35–4.50)

## 2015-04-02 MED ORDER — ROSUVASTATIN CALCIUM 10 MG PO TABS: 10.0000 mg | ORAL_TABLET | Freq: Every day | ORAL | Status: DC

## 2015-04-02 NOTE — Patient Instructions (Addendum)
check your blood sugar once a day.  vary the time of day when you check, between before the 3 meals, and at bedtime.  also check if you have symptoms of your blood sugar being too high or too low.  please keep a record of the readings and bring it to your next appointment here.  You can write it on any piece of paper.  please call us sooner if your blood sugar goes below 70, or if you have a lot of readings over 200.  Please come back for a follow-up appointment in 2 months. blood tests are requested for you today.  We'll let you know about the results.  You can continue the byetta if you wish.   Please stop taking the glimepiride and welchol.   i have sent a prescription to your pharmacy, for crestor.

## 2015-04-02 NOTE — Progress Notes (Signed)
Subjective:    Patient ID: Stacey Roberts, female    DOB: 1965-08-28, 50 y.o.   MRN: [id number]  HPI Pt has stage-1 papillary adenocarcinoma of the thyroid.    2/11: thyroidectomy: T1b N0 M0.  4/11: i-131 103 mci, with thyrogen.  12/11: neck US: single enlarged right cervical lymph node, nonspecific.   6/12: body scan (thyrogen) neg.   12/12: [place]: lymph node is stable 6/13: [place]: overall node morphology and volume show very little change.   9/14  TG undetectable (ab neg) 3/15: TG undetectable (ab neg) 10/15 TG undetectable (ab neg) She does not notice any lump in the neck.   Pt also returns for f/u of diabetes mellitus: DM type: 2 Dx'ed: 4332 Complications: painful neuropathy of the lower extremities Therapy: insulin since early 2017.   GDM: never DKA: never Severe hypoglycemia: never.   Pancreatitis: never Other: she has never taken insulin, but she knows how, as she takes byetta; she declines weight-loss surgery; edema precudes pioglitizone rx.  Interval history: She agrees to take insulin.  She declines multiple daily injections. no cbg record, but states cbg's vary from 103-200's.  There is no trend throughout the day.  She takes 30 units qd.   Past Medical History  Diagnosis Date  . Diabetes mellitus, type II ([place])   . Obesity   . Hypertension   . Hyperlipidemia   . 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 Buddy Duty  . Bronchitis     hx of  . Hypothyroidism   . Snoring   . GERD (gastroesophageal reflux disease)   . Allergy     seasonal  . Sleep apnea 2008    severe OSA-does not use a cpap    Past Surgical History  Procedure Laterality Date  . Tonsillectomy  1982  . Dilation and curettage of uterus  2004    s/p  . Thyroidectomy  02/2009    Dr Harlow Asa  . Breast biopsy  2000    s/p  . Breast lumpectomy Left 01/06/2013    Procedure: LUMPECTOMY;  Surgeon:  Harl Bowie, MD;  Location: [place];  Service: General;  Laterality: Left;  . Breast biopsy Left 01/22/2013    Procedure: RE-EXCISION LEFT BREAST DCIS;  Surgeon: Harl Bowie, MD;  Location: [place];  Service: General;  Laterality: Left;    Social History   Social History  . Marital Status: Single    Spouse Name: N/A  . Number of Children: N/A  . Years of Education: N/A   Occupational History  .  [employer]    Outpatient scheduling in radiology   Social History Main Topics  . Smoking status: Former Smoker    Types: Cigarettes  . Smokeless tobacco: Never Used     Comment: Smoked on and off for 20 years. Quit about 5 years ago.  . Alcohol Use: 0.0 oz/week    0 Standard drinks or equivalent per week     Comment: occasionally  . Drug Use: No  . Sexual Activity: Yes   Other Topics Concern  . Not on file   Social History Narrative   The patient is divorced, moved to [place] in   2006 from [place].  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    No alcohol.  Tobacco use:  She quit 5 years ago.  She smoked on   and off for approximately 20 years.  No history of recreational drug   Use.Drinks two cups of coffee per work day.     Current Outpatient Prescriptions on File Prior to Visit  Medication Sig Dispense Refill  . aspirin 81 MG tablet Take 81 mg by mouth daily.      Marland Kitchen BENICAR 20 MG tablet TAKE 1 TABLET BY MOUTH DAILY. MUST SCHEDULE ANNUAL PHYSICAL FOR MORE REFILLS 30 tablet 2  . canagliflozin (INVOKANA) 300 MG TABS tablet Take 1 tablet (300 mg total) by mouth daily. 30 tablet 2  . exenatide (BYETTA 10 MCG PEN) 10 MCG/0.04ML SOPN injection Inject 0.04 mLs (10 mcg total) into the skin 2 (two) times daily with a meal. And pen needles 2/day 2.4 mL 11  . Insulin Glargine (LANTUS SOLOSTAR) 100 UNIT/ML Solostar Pen Inject 30 Units into the skin every morning.  And pen needles 1/day 5 pen PRN  . levothyroxine (SYNTHROID, LEVOTHROID) 150 MCG tablet TAKE 1 TABLET BY MOUTH DAILY BEFORE BREAKFAST 30 tablet 3  . LYRICA 75 MG capsule TAKE 1 CAPSULE BY MOUTH TWICE A DAY 60 capsule 11  . metFORMIN (GLUCOPHAGE) 1000 MG tablet TAKE 1 TABLET BY MOUTH 2 TIMES DAILY WITH A MEAL. 60 tablet 5  . Multiple Vitamin (MULTIVITAMIN WITH MINERALS) TABS tablet Take 1 tablet by mouth daily.    . Na Sulfate-K Sulfate-Mg Sulf (SUPREP BOWEL PREP) SOLN Take 1 kit by mouth once. suprep as directed. No substitutions 354 mL 0  . ONE TOUCH LANCETS MISC Use to check blood sugar three time a day     . pantoprazole (PROTONIX) 40 MG tablet TAKE 1 TABLET BY MOUTH DAILY. 90 tablet 3  . tamoxifen (NOLVADEX) 20 MG tablet Take 1 tablet (20 mg total) by mouth daily. 90 tablet 3  . TRUETEST TEST test strip USE AS DIRECTED EACH DAY 100 each 0  . [DISCONTINUED] Calcium Carbonate 1500 MG TABS Take 1,500 mg by mouth daily.       No current facility-administered medications on file prior to visit.    Allergies  Allergen Reactions  . Penicillins Shortness Of Breath and Rash  . Ace Inhibitors     REACTION: cough  . Clindamycin/Lincomycin Other (See Comments)    Severe stomach cramps    Family History  Problem Relation Age of Onset  . Dementia Father     deceased age 61 secondary to dementia  . Bipolar disorder Father   . Emphysema Father   . Heart disease Father   . CAD Mother 64    Died age 74 of CAD  . Heart disease Mother   . CAD Maternal Grandfather 60  . Colon cancer Neg Hx   . Colon polyps Neg Hx     BP 126/70 mmHg  Pulse 90  Temp(Src) 98.7 F (37.1 C) (Oral)  Ht '5\' 4"'  (1.626 m)  SpO2 92%  Review of Systems She denies hypoglycemia    Objective:   Physical Exam VITAL SIGNS:  See vs page GENERAL: no distress SKIN:  Insulin injection sites at the anterior abdomen are normal Pulses: dorsalis pedis intact bilat.   MSK: no deformity of the feet CV: no leg  edema Skin:  no ulcer on the feet.  normal color and temp on the feet. Neuro: sensation is intact to touch on the feet   Lab Results  Component Value Date   CHOL 222* 09/01/2014  HDL 39.00* 09/01/2014   LDLCALC 70 11/15/2011   LDLDIRECT 153.0 09/01/2014   TRIG 317.0* 09/01/2014   CHOLHDL 6 09/01/2014        Assessment & Plan:  DM: glycemic control is improved. she is ready to transition off orals. Dyslipidemia: new to me.  I advised her to see PCP for this, but she declines.  Patient is advised the following: Patient Instructions  check your blood sugar once a day.  vary the time of day when you check, between before the 3 meals, and at bedtime.  also check if you have symptoms of your blood sugar being too high or too low.  please keep a record of the readings and bring it to your next appointment here.  You can write it on any piece of paper.  please call us sooner if your blood sugar goes below 70, or if you have a lot of readings over 200.  Please come back for a follow-up appointment in 2 months. blood tests are requested for you today.  We'll let you know about the results.  You can continue the byetta if you wish.   Please stop taking the glimepiride and welchol.   i have sent a prescription to your pharmacy, for crestor.

## 2015-04-03 DIAGNOSIS — E119 Type 2 diabetes mellitus without complications: Secondary | ICD-10-CM | POA: Insufficient documentation

## 2015-04-03 LAB — FRUCTOSAMINE: FRUCTOSAMINE: 260 umol/L (ref 0–285)

## 2015-04-03 NOTE — Progress Notes (Signed)
   Subjective:    Patient ID: Stacey Roberts, female    DOB: 02-18-1965, 50 y.o.   MRN: [id number]  HPI    Review of Systems     Objective:   Physical Exam        Assessment & Plan:

## 2015-04-05 ENCOUNTER — Ambulatory Visit (AMBULATORY_SURGERY_CENTER): Payer: 59 | Admitting: Internal Medicine

## 2015-04-05 ENCOUNTER — Encounter: Payer: Self-pay | Admitting: Internal Medicine

## 2015-04-05 VITALS — BP 130/91 | HR 82 | Temp 97.7°F | Resp 24 | Ht 64.0 in | Wt 257.0 lb

## 2015-04-05 DIAGNOSIS — Z1211 Encounter for screening for malignant neoplasm of colon: Secondary | ICD-10-CM | POA: Diagnosis present

## 2015-04-05 DIAGNOSIS — E119 Type 2 diabetes mellitus without complications: Secondary | ICD-10-CM | POA: Diagnosis not present

## 2015-04-05 DIAGNOSIS — I10 Essential (primary) hypertension: Secondary | ICD-10-CM | POA: Diagnosis not present

## 2015-04-05 DIAGNOSIS — G4733 Obstructive sleep apnea (adult) (pediatric): Secondary | ICD-10-CM | POA: Diagnosis not present

## 2015-04-05 DIAGNOSIS — E039 Hypothyroidism, unspecified: Secondary | ICD-10-CM | POA: Diagnosis not present

## 2015-04-05 LAB — GLUCOSE, CAPILLARY
GLUCOSE-CAPILLARY: 166 mg/dL — AB (ref 65–99)
Glucose-Capillary: 142 mg/dL — ABNORMAL HIGH (ref 65–99)

## 2015-04-05 LAB — THYROGLOBULIN LEVEL: Thyroglobulin: 0.1 ng/mL — ABNORMAL LOW (ref 2.8–40.9)

## 2015-04-05 LAB — THYROGLOBULIN ANTIBODY: Thyroglobulin Ab: <1 IU/mL (ref <2)

## 2015-04-05 MED ORDER — SODIUM CHLORIDE 0.9 % IV SOLN: 500.0000 mL | INTRAVENOUS | Status: DC

## 2015-04-05 NOTE — Patient Instructions (Signed)
YOU HAD AN ENDOSCOPIC PROCEDURE TODAY AT [facility]:   Refer to the procedure report that was given to you for any specific questions about what was found during the examination.  If the procedure report does not answer your questions, please call your gastroenterologist to clarify.  If you requested that your care partner not be given the details of your procedure findings, then the procedure report has been included in a sealed envelope for you to review at your convenience later.  YOU SHOULD EXPECT: Some feelings of bloating in the abdomen. Passage of more gas than usual.  Walking can help get rid of the air that was put into your GI tract during the procedure and reduce the bloating. If you had a lower endoscopy (such as a colonoscopy or flexible sigmoidoscopy) you may notice spotting of blood in your stool or on the toilet paper. If you underwent a bowel prep for your procedure, you may not have a normal bowel movement for a few days.  Please Note:  You might notice some irritation and congestion in your nose or some drainage.  This is from the oxygen used during your procedure.  There is no need for concern and it should clear up in a day or so.  SYMPTOMS TO REPORT IMMEDIATELY:   Following lower endoscopy (colonoscopy or flexible sigmoidoscopy):  Excessive amounts of blood in the stool  Significant tenderness or worsening of abdominal pains  Swelling of the abdomen that is new, acute  Fever of 100F or higher   For urgent or emergent issues, a gastroenterologist can be reached at any hour by calling [phone].   DIET: Your first meal following the procedure should be a small meal and then it is ok to progress to your normal diet. Heavy or fried foods are harder to digest and may make you feel nauseous or bloated.  Likewise, meals heavy in dairy and vegetables can increase bloating.  Drink plenty of fluids but you should avoid alcoholic beverages for 24 hours. Try to  increase the fiber in your diet.  Drink plenty of water.  ACTIVITY:  You should plan to take it easy for the rest of today and you should NOT DRIVE or use heavy machinery until tomorrow (because of the sedation medicines used during the test).    FOLLOW UP: Our staff will call the number listed on your records the next business day following your procedure to check on you and address any questions or concerns that you may have regarding the information given to you following your procedure. If we do not reach you, we will leave a message.  However, if you are feeling well and you are not experiencing any problems, there is no need to return our call.  We will assume that you have returned to your regular daily activities without incident.  If any biopsies were taken you will be contacted by phone or by letter within the next 1-3 weeks.  Please call us at [phone] if you have not heard about the biopsies in 3 weeks.    SIGNATURES/CONFIDENTIALITY: You and/or your care partner have signed paperwork which will be entered into your electronic medical record.  These signatures attest to the fact that that the information above on your After Visit Summary has been reviewed and is understood.  Full responsibility of the confidentiality of this discharge information lies with you and/or your care-partner.  Read all of the handouts given to you by your recovery  room nurse.  Thank-you for choosing us for your healthcare needs today. 

## 2015-04-05 NOTE — Op Note (Signed)
[place] Patient Name: Stacey Roberts Procedure Date: 04/05/2015 10:38 AM MRN: [id number] Endoscopist: Docia Chuck. Henrene Pastor , MD Age: 50 Referring MD:  Date of Birth: [birth year] Gender: Female Procedure:                Colonoscopy Indications:              Screening for colorectal malignant neoplasm. Index                            exam Medicines:                Monitored Anesthesia Care Procedure:                Pre-Anesthesia Assessment:                           - Prior to the procedure, a History and Physical                            was performed, and patient medications and                            allergies were reviewed. The patient's tolerance of                            previous anesthesia was also reviewed. The risks                            and benefits of the procedure and the sedation                            options and risks were discussed with the patient.                            All questions were answered, and informed consent                            was obtained. Prior Anticoagulants: The patient has                            taken no previous anticoagulant or antiplatelet                            agents. ASA Grade Assessment: II - A patient with                            mild systemic disease. After reviewing the risks                            and benefits, the patient was deemed in                            satisfactory condition to undergo the procedure.  After obtaining informed consent, the colonoscope                            was passed under direct vision. Throughout the                            procedure, the patient's blood pressure, pulse, and                            oxygen saturations were monitored continuously. The                            Model CF-HQ190L [id number]) scope was introduced                            through the anus and advanced to the the cecum,   identified by appendiceal orifice and ileocecal                            valve. The colonoscopy was performed without                            difficulty. The patient tolerated the procedure                            well. The quality of the bowel preparation was                            excellent. The bowel preparation used was SUPREP.                            The ileocecal valve, appendiceal orifice, and                            rectum were photographed. Scope In: 10:47:32 AM Scope Out: 10:57:53 AM Scope Withdrawal Time: 0 hours 7 minutes 30 seconds  Total Procedure Duration: 0 hours 10 minutes 21 seconds  Findings:      Multiple diverticula were found in the entire colon.      The exam was otherwise without abnormality on direct and retroflexion       views. Complications:            No immediate complications. Estimated Blood Loss:     Estimated blood loss: none. Impression:               - Diverticulosis in the entire examined colon.                           - The examination was otherwise normal on direct                            and retroflexion views.                           - No specimens collected. Recommendation:           -  Patient has a contact number available for                            emergencies. The signs and symptoms of potential                            delayed complications were discussed with the                            patient. Return to normal activities tomorrow.                            Written discharge instructions were provided to the                            patient.                           - Resume previous diet.                           - Continue present medications.                           - Repeat colonoscopy in 10 years for screening                            purposes. Procedure Code(s):        --- Professional ---                           [phone], Colonoscopy, flexible; diagnostic, including                             collection of specimen(s) by brushing or washing,                            when performed (separate procedure) CPT copyright 2016 American Medical Association. All rights reserved. Docia Chuck. Henrene Pastor, MD Docia Chuck. Henrene Pastor, MD 04/05/2015 11:09:01 AM This report has been signed electronically. Number of Addenda: 0

## 2015-04-05 NOTE — Progress Notes (Signed)
To recovery, report to Hodges, RN, VSS 

## 2015-04-05 NOTE — Progress Notes (Signed)
PT. Denies any signs and symptoms  Of elevated blood pressure,she stated she feels fine and that she did not take blood pressure today or yesterday.

## 2015-04-06 ENCOUNTER — Telehealth: Payer: Self-pay | Admitting: *Deleted

## 2015-04-06 NOTE — Telephone Encounter (Signed)
  Follow up Call-  Call back number 04/05/2015  Post procedure Call Back phone  # [phone]  Permission to leave phone message Yes     Patient questions:  Do you have a fever, pain , or abdominal swelling? No. Pain Score  0 *  Have you tolerated food without any problems? Yes.    Have you been able to return to your normal activities? Yes.    Do you have any questions about your discharge instructions: Diet   No. Medications  No. Follow up visit  No.  Do you have questions or concerns about your Care? No.  Actions: * If pain score is 4 or above: No action needed, pain <4.

## 2015-04-16 ENCOUNTER — Ambulatory Visit: Payer: Self-pay | Admitting: Pharmacist

## 2015-04-19 ENCOUNTER — Other Ambulatory Visit: Payer: Self-pay | Admitting: Internal Medicine

## 2015-04-23 ENCOUNTER — Ambulatory Visit: Payer: Self-pay | Admitting: Pharmacist

## 2015-04-30 ENCOUNTER — Encounter: Payer: Self-pay | Admitting: Pharmacist

## 2015-04-30 ENCOUNTER — Other Ambulatory Visit: Payer: Self-pay | Admitting: Pharmacist

## 2015-04-30 NOTE — Patient Outreach (Signed)
[name] [facility]) Care Management  [place]   04/30/2015  Stacey Roberts 09/15/1965 [id number]  Subjective: Patient presents today for diabetes follow-up as part of the employer-sponsored Link to Wellness program.  Patient reports adherence with diabetes medications.  Current diabetes regimen includes metformin 1000 mg BID, Byetta 10 mcg BID, canagliflozin 300 mg daily, and Lantus 30 units daily.  Patient reports she continues to monitor blood glucose once daily.  Blood glucose average for the past 7-days was 196 mg/dL.  Patient denies hypoglycemia.  Patient also continues on daily ASA, ARB and statin.  Most recent MD follow-up was 04/02/15.  Patient was initiated on statin therapy at that time and denies any adverse effects.  Patient has a pending appt for May 2017.   Patient reports she has stopped Weight Watchers but she continues to try to eat low carb.  She reports she has started using [employer] program which delivers 3 meals per week, and patient chooses the low carbohydrate menu.  Patient reports she continues to walk during breaks at work and is still aiming to walk 5000 steps each day.    Objective:  Blood pressure = 148/94  Blood glucose: 7-day average = 196 mg/dL 14-day average = 208 mg/dL 30-day average = 183 mg/dL  Encounter Medications: Outpatient Encounter Prescriptions as of 04/30/2015  Medication Sig Note  . aspirin 81 MG tablet Take 81 mg by mouth daily.     . canagliflozin (INVOKANA) 300 MG TABS tablet Take 1 tablet (300 mg total) by mouth daily.   Marland Kitchen exenatide (BYETTA 10 MCG PEN) 10 MCG/0.04ML SOPN injection Inject 0.04 mLs (10 mcg total) into the skin 2 (two) times daily with a meal. And pen needles 2/day   . Insulin Glargine (LANTUS SOLOSTAR) 100 UNIT/ML Solostar Pen Inject 30 Units into the skin every morning. And pen needles 1/day 03/19/2015: Reports taking 30 to 40 units depending on morning blood glucose   . levothyroxine (SYNTHROID, LEVOTHROID)  150 MCG tablet TAKE 1 TABLET BY MOUTH DAILY BEFORE BREAKFAST   . LYRICA 75 MG capsule TAKE 1 CAPSULE BY MOUTH TWICE A DAY   . metFORMIN (GLUCOPHAGE) 1000 MG tablet TAKE 1 TABLET BY MOUTH 2 TIMES DAILY WITH A MEAL.   . Multiple Vitamin (MULTIVITAMIN WITH MINERALS) TABS tablet Take 1 tablet by mouth daily.   Marland Kitchen olmesartan (BENICAR) 20 MG tablet TAKE 1 TABLET BY MOUTH DAILY. MUST SCHEDULE ANNUAL PHYSICAL FOR MORE REFILLS   . ONE TOUCH LANCETS MISC Use to check blood sugar three time a day  08/05/2014: Using True Result meter and test strips  . pantoprazole (PROTONIX) 40 MG tablet TAKE 1 TABLET BY MOUTH DAILY.   . rosuvastatin (CRESTOR) 10 MG tablet Take 1 tablet (10 mg total) by mouth daily.   . TRUETEST TEST test strip USE AS DIRECTED EACH DAY   . tamoxifen (NOLVADEX) 20 MG tablet Take 1 tablet (20 mg total) by mouth daily. (Patient not taking: Reported on 04/05/2015) 09/11/2014: Stopped taking about 2 weeks ago    No facility-administered encounter medications on file as of 04/30/2015.    Functional Status: In your present state of health, do you have any difficulty performing the following activities: 02/15/2015 08/05/2014  Hearing? N N  Vision? N N  Difficulty concentrating or making decisions? N N  Walking or climbing stairs? N N  Dressing or bathing? N N  Doing errands, shopping? N N    Fall/Depression Screening: [facility] 2/9 Scores 02/15/2015 08/05/2014 09/09/2013  PHQ - 2 Score 0 0 1    Assessment: 1.  Diabetes: Most recent A1C was 10.7% which is exceeding goal of less than 7%.  However, fructosamine level was checked at most recent endocrinology visit on 04/02/15 and level was normal.  Weight is stable from last visit with me.  Patient reports adherence with diabetes regimen, but reports having a very stressful week this week.  Patient denies hypoglycemic events and is able to verbalize appropriate hypoglycemia management plan.  Patient continues to be motivated to improve diet and exercise.     2.  Blood pressure:  Not at goal of < 140/90.  Current regimen includes olmesartan 20 mg daily.  Patient reports she was out of olmesartan and did not take it today.  Patient reports she just picked up prescription from [place] prior to visit.  Encouraged adherence with medication.    Plan: 1.  Patient to take all medications as prescribed.   2.  Encouraged patient to continue low carbohydrate diet and set goal to follow a diabetic diet at least 75% of the week.   3.  Patient set goal to try to walk 5000 steps each day.   4.  Follow up appointment on 06/25/15.     THN CM Care Plan Problem One        Most Recent Value   Care Plan Problem One  Eating out too much   Role Documenting the Problem One  Clinical Pharmacist   Care Plan for Problem One  Active   THN CM Short Term Goal #3 (0-30 days)  Patient will continue Weight Watchers program for the next 30 days per patient report.    THN CM Short Term Goal #3 Start Date  03/19/15   [facility] CM Short Term Goal #3 Met Date  04/30/15 Barrie Folk not met]   Interventions for Short Tern Goal #3  Patient reports she is no longer participating in the Weight Watchers program as it become too much to log food.    THN CM Short Term Goal #4 (0-30 days)  Patient will follow a diabetic diet 75% of the week in the next 90 days per patient report.    THN CM Short Term Goal #4 Start Date  04/30/15   Interventions for Short Term Goal #4  Discussed diabetic diet and importance of diet and exercise in diabetes management.  patient reports she has started using [employer] to help her plan meals during the week and she is ordering their low carbohydrate meals.      THN CM Care Plan Problem Two        Most Recent Value   Care Plan Problem Two  Not exercising on a routine basis   Role Documenting the Problem Two  Clinical Pharmacist   Care Plan for Problem Two  Active   THN CM Short Term Goal #1 (0-30 days)  Patient will try to walk 5000 steps every day as monitored by  her Fit Bit over the next 90 days.    THN CM Short Term Goal #1 Start Date  04/30/15   Interventions for Short Term Goal #2   Discussed imporatnce of exercise in diabetes management.  Encouraged patient to continue to be active and walk throughout the day to increase the amont of steps monitored by her fitbit.     THN CM Short Term Goal #2 Met Date  -- [goal not met]   THN CM Short Term Goal #4 (0-30 days)  Patient  will start swimming at the gym 1 to 2 days per week in addition to walking four days per week in the next 30 days per patient report.    THN CM Short Term Goal #4 Start Date  03/19/15   [facility] CM Short Term Goal #4 Met Date  04/30/15 Barrie Folk not met]   Interventions of Short Term Goal #4  Patient reports she is unable to swim at the gym anymore as her fiance no longer has a gym membership     Elisabeth Most, Pharm.D. Pharmacy Resident [place] [phone]

## 2015-05-05 ENCOUNTER — Other Ambulatory Visit: Payer: Self-pay | Admitting: Family Medicine

## 2015-05-05 NOTE — Telephone Encounter (Signed)
Pt last seen for acute 12/2014. pls advise

## 2015-05-31 ENCOUNTER — Other Ambulatory Visit: Payer: Self-pay | Admitting: Endocrinology

## 2015-06-02 ENCOUNTER — Ambulatory Visit: Payer: 59 | Admitting: Endocrinology

## 2015-06-18 ENCOUNTER — Ambulatory Visit (INDEPENDENT_AMBULATORY_CARE_PROVIDER_SITE_OTHER): Payer: 59 | Admitting: Endocrinology

## 2015-06-18 ENCOUNTER — Encounter: Payer: Self-pay | Admitting: Endocrinology

## 2015-06-18 VITALS — BP 120/92 | HR 85 | Temp 97.9°F | Resp 12 | Wt 250.2 lb

## 2015-06-18 DIAGNOSIS — E119 Type 2 diabetes mellitus without complications: Secondary | ICD-10-CM | POA: Diagnosis not present

## 2015-06-18 LAB — POCT GLYCOSYLATED HEMOGLOBIN (HGB A1C): HEMOGLOBIN A1C: 9.4

## 2015-06-18 MED ORDER — INSULIN GLARGINE 100 UNIT/ML SOLOSTAR PEN: 40.0000 [IU] | PEN_INJECTOR | SUBCUTANEOUS | Status: DC

## 2015-06-18 NOTE — Patient Instructions (Addendum)
check your blood sugar once a day.  vary the time of day when you check, between before the 3 meals, and at bedtime.  also check if you have symptoms of your blood sugar being too high or too low.  please keep a record of the readings and bring it to your next appointment here.  You can write it on any piece of paper.  please call us sooner if your blood sugar goes below 70, or if you have a lot of readings over 200.  Please increase the lantus to 40 units each morning.  Please continue the same other medications for diabetes.  Please call us next week, to tell us how the blood sugar is doing.  Please let us know if you want to see a counselor--it can help you through this Please come back for a follow-up appointment in 3 months.

## 2015-06-18 NOTE — Progress Notes (Signed)
Subjective:    Patient ID: Stacey Roberts, female    DOB: 07-Feb-1965, 50 y.o.   MRN: [id number]  HPI Pt has stage-1 papillary adenocarcinoma of the thyroid.    2/11: thyroidectomy: T1b N0 M0.  4/11: i-131 103 mci, with thyrogen.  12/11: neck US: single enlarged right cervical lymph node, nonspecific.   6/12: body scan (thyrogen) neg.   12/12: [place]: lymph node is stable 6/13: [place]: overall node morphology and volume show very little change.   9/14  TG undetectable (ab neg) 3/15: TG undetectable (ab neg) 10/15 TG undetectable (ab neg) 3/17 TG undetectable (ab neg)  Pt returns for f/u of diabetes mellitus: DM type: Insulin-requiring type 2 Dx'ed: [id number] Complications: painful neuropathy of the lower extremities Therapy: insulin since early 2017 (also byetta and 2 oral meds) GDM: never DKA: never Severe hypoglycemia: never.   Pancreatitis: never Other: she declines multiple daily injections  she declines weight-loss surgery; edema precudes pioglitizone rx.  Interval history: no cbg record, but states cbg's vary from 160-200's.  It is in general higher as the day goes on.  pt states she feels well in general.  She says a 17-year relationship has just ended.   Past Medical History  Diagnosis Date  . Diabetes mellitus, type II ([place])   . Obesity   . Hypertension   . Hyperlipidemia   . 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 Buddy Duty  . Bronchitis     hx of  . Hypothyroidism   . Snoring   . GERD (gastroesophageal reflux disease)   . Allergy     seasonal  . Sleep apnea 2008    severe OSA-does not use a cpap    Past Surgical History  Procedure Laterality Date  . Tonsillectomy  1982  . Dilation and curettage of uterus  2004    s/p  . Thyroidectomy  02/2009    Dr Harlow Asa  . Breast biopsy  2000    s/p  . Breast lumpectomy Left 01/06/2013    Procedure: LUMPECTOMY;  Surgeon:  Harl Bowie, MD;  Location: [facility];  Service: General;  Laterality: Left;  . Breast biopsy Left 01/22/2013    Procedure: RE-EXCISION LEFT BREAST DCIS;  Surgeon: Harl Bowie, MD;  Location: [facility];  Service: General;  Laterality: Left;    Social History   Social History  . Marital Status: Single    Spouse Name: N/A  . Number of Children: N/A  . Years of Education: N/A   Occupational History  .  [employer]    Outpatient scheduling in radiology   Social History Main Topics  . Smoking status: Former Smoker    Types: Cigarettes  . Smokeless tobacco: Never Used     Comment: Smoked on and off for 20 years. Quit about 5 years ago.  . Alcohol Use: 0.0 oz/week    0 Standard drinks or equivalent per week     Comment: occasionally  . Drug Use: No  . Sexual Activity: Yes   Other Topics Concern  . Not on file   Social History Narrative   The patient is divorced, moved to [place] in   2006 from [place].  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    No alcohol.  Tobacco use:  She  quit 5 years ago.  She smoked on   and off for approximately 20 years.  No history of recreational drug   Use.Drinks two cups of coffee per work day.     Current Outpatient Prescriptions on File Prior to Visit  Medication Sig Dispense Refill  . aspirin 81 MG tablet Take 81 mg by mouth daily.      . canagliflozin (INVOKANA) 300 MG TABS tablet Take 1 tablet (300 mg total) by mouth daily. 30 tablet 2  . exenatide (BYETTA 10 MCG PEN) 10 MCG/0.04ML SOPN injection Inject 0.04 mLs (10 mcg total) into the skin 2 (two) times daily with a meal. And pen needles 2/day 2.4 mL 11  . levothyroxine (SYNTHROID, LEVOTHROID) 150 MCG tablet TAKE 1 TABLET BY MOUTH DAILY BEFORE BREAKFAST 30 tablet 3  . LYRICA 75 MG capsule TAKE 1 CAPSULE BY MOUTH TWICE A DAY 60 capsule 11  . metFORMIN (GLUCOPHAGE) 1000 MG  tablet TAKE 1 TABLET BY MOUTH 2 TIMES DAILY WITH A MEAL. 60 tablet 6  . Multiple Vitamin (MULTIVITAMIN WITH MINERALS) TABS tablet Take 1 tablet by mouth daily.    Marland Kitchen olmesartan (BENICAR) 20 MG tablet TAKE 1 TABLET BY MOUTH DAILY. MUST SCHEDULE ANNUAL PHYSICAL FOR MORE REFILLS 30 tablet 2  . ONE TOUCH LANCETS MISC Use to check blood sugar three time a day     . pantoprazole (PROTONIX) 40 MG tablet TAKE 1 TABLET BY MOUTH ONCE DAILY 90 tablet 0  . rosuvastatin (CRESTOR) 10 MG tablet Take 1 tablet (10 mg total) by mouth daily. 90 tablet 3  . tamoxifen (NOLVADEX) 20 MG tablet Take 1 tablet (20 mg total) by mouth daily. 90 tablet 3  . TRUETEST TEST test strip USE AS DIRECTED EACH DAY 100 each 0  . [DISCONTINUED] Calcium Carbonate 1500 MG TABS Take 1,500 mg by mouth daily.       No current facility-administered medications on file prior to visit.    Allergies  Allergen Reactions  . Penicillins Shortness Of Breath and Rash  . Ace Inhibitors     REACTION: cough  . Clindamycin/Lincomycin Other (See Comments)    Severe stomach cramps    Family History  Problem Relation Age of Onset  . Dementia Father     deceased age 43 secondary to dementia  . Bipolar disorder Father   . Emphysema Father   . Heart disease Father   . CAD Mother 24    Died age 55 of CAD  . Heart disease Mother   . CAD Maternal Grandfather 60  . Colon cancer Neg Hx   . Colon polyps Neg Hx     BP 120/92 mmHg  Pulse 85  Temp(Src) 97.9 F (36.6 C) (Oral)  Resp 12  Wt 250 lb 4 oz (113.513 kg)  SpO2 96%  Review of Systems She denies hypoglycemia.     Objective:   Physical Exam VITAL SIGNS:  See vs page GENERAL: no distress Pulses: dorsalis pedis intact bilat.   MSK: no deformity of the feet CV: no leg edema Skin:  no ulcer on the feet.  normal color and temp on the feet. Neuro: sensation is intact to touch on the feet. PSYCH: tearful.   A1c=9.4%    Assessment & Plan:  DM: worse. Bereavement, new.    Patient is advised the following: Patient Instructions  check your blood sugar once a day.  vary the time of day when you check, between before the 3 meals, and at bedtime.  also  check if you have symptoms of your blood sugar being too high or too low.  please keep a record of the readings and bring it to your next appointment here.  You can write it on any piece of paper.  please call us sooner if your blood sugar goes below 70, or if you have a lot of readings over 200.  Please increase the lantus to 40 units each morning.  Please continue the same other medications for diabetes.  Please call us next week, to tell us how the blood sugar is doing.  Please let us know if you want to see a counselor--it can help you through this Please come back for a follow-up appointment in 3 months.

## 2015-06-25 ENCOUNTER — Ambulatory Visit: Payer: Self-pay | Admitting: Pharmacist

## 2015-07-14 ENCOUNTER — Other Ambulatory Visit: Payer: Self-pay | Admitting: Endocrinology

## 2015-07-14 ENCOUNTER — Other Ambulatory Visit: Payer: Self-pay | Admitting: Internal Medicine

## 2015-07-16 ENCOUNTER — Encounter: Payer: Self-pay | Admitting: Pharmacist

## 2015-07-16 ENCOUNTER — Other Ambulatory Visit: Payer: Self-pay | Admitting: Pharmacist
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
## 2015-07-20 ENCOUNTER — Encounter: Payer: Self-pay | Admitting: Internal Medicine

## 2015-07-20 ENCOUNTER — Ambulatory Visit (INDEPENDENT_AMBULATORY_CARE_PROVIDER_SITE_OTHER): Payer: 59 | Admitting: Internal Medicine

## 2015-07-20 VITALS — BP 122/80 | HR 80 | Temp 97.8°F | Ht 64.0 in | Wt 248.2 lb

## 2015-07-20 DIAGNOSIS — C50112 Malignant neoplasm of central portion of left female breast: Secondary | ICD-10-CM

## 2015-07-20 DIAGNOSIS — Z794 Long term (current) use of insulin: Secondary | ICD-10-CM

## 2015-07-20 DIAGNOSIS — F32A Depression, unspecified: Secondary | ICD-10-CM

## 2015-07-20 DIAGNOSIS — E1142 Type 2 diabetes mellitus with diabetic polyneuropathy: Secondary | ICD-10-CM

## 2015-07-20 DIAGNOSIS — F329 Major depressive disorder, single episode, unspecified: Secondary | ICD-10-CM

## 2015-07-20 DIAGNOSIS — F419 Anxiety disorder, unspecified: Secondary | ICD-10-CM

## 2015-07-20 DIAGNOSIS — Z0001 Encounter for general adult medical examination with abnormal findings: Secondary | ICD-10-CM | POA: Diagnosis not present

## 2015-07-20 DIAGNOSIS — K219 Gastro-esophageal reflux disease without esophagitis: Secondary | ICD-10-CM

## 2015-07-20 DIAGNOSIS — C73 Malignant neoplasm of thyroid gland: Secondary | ICD-10-CM | POA: Diagnosis not present

## 2015-07-20 DIAGNOSIS — I10 Essential (primary) hypertension: Secondary | ICD-10-CM

## 2015-07-20 DIAGNOSIS — F418 Other specified anxiety disorders: Secondary | ICD-10-CM

## 2015-07-20 DIAGNOSIS — E890 Postprocedural hypothyroidism: Secondary | ICD-10-CM | POA: Diagnosis not present

## 2015-07-20 DIAGNOSIS — E785 Hyperlipidemia, unspecified: Secondary | ICD-10-CM

## 2015-07-20 LAB — LIPID PANEL
CHOLESTEROL: 139 mg/dL (ref 0–200)
HDL: 48.6 mg/dL (ref >39.00)
LDL CALC: 60 mg/dL (ref 0–99)
NonHDL: 90.78
TRIGLYCERIDES: 156 mg/dL — AB (ref 0.0–149.0)
Total CHOL/HDL Ratio: 3
VLDL: 31.2 mg/dL (ref 0.0–40.0)

## 2015-07-20 LAB — COMPREHENSIVE METABOLIC PANEL
ALBUMIN: 4.1 g/dL (ref 3.5–5.2)
ALK PHOS: 74 U/L (ref 39–117)
ALT: 12 U/L (ref 0–35)
AST: 10 U/L (ref 0–37)
BUN: 16 mg/dL (ref 6–23)
CALCIUM: 9.7 mg/dL (ref 8.4–10.5)
CHLORIDE: 103 meq/L (ref 96–112)
CO2: 32 mEq/L (ref 19–32)
CREATININE: 0.67 mg/dL (ref 0.40–1.20)
GFR: 98.83 mL/min (ref >60.00)
Glucose, Bld: 194 mg/dL — ABNORMAL HIGH (ref 70–99)
Potassium: 4.8 mEq/L (ref 3.5–5.1)
Sodium: 140 mEq/L (ref 135–145)
TOTAL PROTEIN: 7.5 g/dL (ref 6.0–8.3)
Total Bilirubin: 0.5 mg/dL (ref 0.2–1.2)

## 2015-07-20 LAB — CBC
HCT: 43.1 % (ref 36.0–46.0)
Hemoglobin: 14 g/dL (ref 12.0–15.0)
MCHC: 32.6 g/dL (ref 30.0–36.0)
MCV: 83.9 fl (ref 78.0–100.0)
Platelets: 295 10*3/uL (ref 150.0–400.0)
RBC: 5.14 Mil/uL — AB (ref 3.87–5.11)
RDW: 15.8 % — AB (ref 11.5–15.5)
WBC: 9.7 10*3/uL (ref 4.0–10.5)

## 2015-07-20 NOTE — Assessment & Plan Note (Signed)
Uncontrolled, following with Dr. Verne Carrow to refer her to diabetes education and nutrition- she declines Encouraged her to check her sugars daily, and consume a low fat, low carb diet Eye exam scheduled Foot exam today She declines pneumovax today Continue baby ASA

## 2015-07-20 NOTE — Assessment & Plan Note (Signed)
Controlled on Benicar CMET today

## 2015-07-20 NOTE — Progress Notes (Signed)
Subjective:    Patient ID: Stacey Roberts, female    DOB: 09/05/1965, 50 y.o.   MRN: [id number]  HPI  Pt presents to the clinic today for her annual exam. She is also due for follow up of chronic conditions  Flu: 09/2014 Tetanus: 2007 Pneumovax: 2007 LMP: IUD Pap Smear: 02/2014- normal Mammogram: 01/2015 Colon Screening: 03/2015, 10 years Eye Doctor: yearly, scheduled end of July, [place] Dentist: biannually, scheduled next week.  Diet: She does eat meat. She consumes fruits and veggies a few days per week. She does eat some fried foods. She drinks mostly water. Exercise: She walks for 30 minutes daily.  DM2: [name] 9.4% (05/2015). On Metformin, Invokana, Byetta and Lantus. She does not check her sugars. She does take a baby ASA daily. Her LDL is 153. Her last eye exam 1 year ago, scheduled for end of July. Flu 09/2014. Pneumovax 2007. She is followed by Dr. Loanne Drilling.  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 Griffith Citron - oncologist. She is not undergoing treatment at this time, getting yearly mammograms.  Anxiety: Improved, does not feel like she needs medication at time.  Hypothyroidism: She is taking Synthroid daily. She last had her TSH checked 03/2015.  GERD: Triggered by eating too much and NSAIDS. She takes Protonix daily as prescribed because she was taken too much Naproxen.  HLD: Her last LDL was 153. She denies myalgias on Crestor. She does not try to consume a low fat diet.  HTN: BP controlled on Benicar. Her BP today is 122/80. ECG from 10/2012.  Review of Systems      Past Medical History  Diagnosis Date  . Diabetes mellitus, type II ([place])   . Obesity   . Hypertension   . Hyperlipidemia   . 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 Buddy Duty  . Bronchitis     hx of  . Hypothyroidism   . Snoring   . GERD (gastroesophageal reflux disease)    . Allergy     seasonal  . Sleep apnea 2008    severe OSA-does not use a cpap    Current Outpatient Prescriptions  Medication Sig Dispense Refill  . aspirin 81 MG tablet Take 81 mg by mouth daily.      . canagliflozin (INVOKANA) 300 MG TABS tablet Take 1 tablet (300 mg total) by mouth daily. 30 tablet 2  . exenatide (BYETTA 10 MCG PEN) 10 MCG/0.04ML SOPN injection Inject 0.04 mLs (10 mcg total) into the skin 2 (two) times daily with a meal. And pen needles 2/day 2.4 mL 11  . Insulin Glargine (LANTUS SOLOSTAR) 100 UNIT/ML Solostar Pen Inject 40 Units into the skin every morning. And pen needles 1/day 5 pen PRN  . levothyroxine (SYNTHROID, LEVOTHROID) 150 MCG tablet TAKE 1 TABLET BY MOUTH DAILY BEFORE BREAKFAST 30 tablet 3  . LYRICA 75 MG capsule TAKE 1 CAPSULE BY MOUTH TWICE A DAY 60 capsule 11  . metFORMIN (GLUCOPHAGE) 1000 MG tablet TAKE 1 TABLET BY MOUTH 2 TIMES DAILY WITH A MEAL. 60 tablet 6  . Multiple Vitamin (MULTIVITAMIN WITH MINERALS) TABS tablet Take 1 tablet by mouth daily.    Marland Kitchen olmesartan (BENICAR) 20 MG tablet Take 1 tablet (20 mg total) by mouth daily. 30 tablet 0  . ONE TOUCH LANCETS MISC Use to check blood sugar three time a day     . pantoprazole (PROTONIX)  40 MG tablet TAKE 1 TABLET BY MOUTH ONCE DAILY 90 tablet 0  . rosuvastatin (CRESTOR) 10 MG tablet Take 1 tablet (10 mg total) by mouth daily. 90 tablet 3  . TRUETEST TEST test strip USE AS DIRECTED EACH DAY 100 each 0  . [DISCONTINUED] Calcium Carbonate 1500 MG TABS Take 1,500 mg by mouth daily.       No current facility-administered medications for this visit.    Allergies  Allergen Reactions  . Penicillins Shortness Of Breath and Rash  . Clindamycin/Lincomycin Other (See Comments)    Severe stomach cramps  . Ace Inhibitors Cough    REACTION: cough; denies airway involvement     Family History  Problem Relation Age of Onset  . Dementia Father     deceased age 2 secondary to dementia  . Bipolar disorder  Father   . Emphysema Father   . Heart disease Father   . CAD Mother 40    Died age 10 of CAD  . Heart disease Mother   . CAD Maternal Grandfather 60  . Colon cancer Neg Hx   . Colon polyps Neg Hx     Social History   Social History  . Marital Status: Single    Spouse Name: N/A  . Number of Children: N/A  . Years of Education: N/A   Occupational History  .  [employer]    Outpatient scheduling in radiology   Social History Main Topics  . Smoking status: Former Smoker    Types: Cigarettes  . Smokeless tobacco: Never Used     Comment: Smoked on and off for 20 years. Quit about 5 years ago.  . Alcohol Use: 0.0 oz/week    0 Standard drinks or equivalent per week     Comment: occasionally  . Drug Use: No  . Sexual Activity: Yes   Other Topics Concern  . Not on file   Social History Narrative   The patient is divorced, moved to [place] in   2006 from [place].  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    No alcohol.  Tobacco use:  She quit 5 years ago.  She smoked on   and off for approximately 20 years.  No history of recreational drug   Use.Drinks two cups of coffee per work day.      Constitutional: Denies fever, malaise, fatigue, headache or abrupt weight changes.  HEENT: Denies eye pain, eye redness, ear pain, ringing in the ears, wax buildup, runny nose, nasal congestion, bloody nose, or sore throat. Respiratory: Denies difficulty breathing, shortness of breath, cough or sputum production.   Cardiovascular: Denies chest pain, chest tightness, palpitations or swelling in the hands or feet.  Gastrointestinal: Denies abdominal pain, bloating, constipation, diarrhea or blood in the stool.  GU: Denies urgency, frequency, pain with urination, burning sensation, blood in urine, odor or discharge. Musculoskeletal: {t reports right wrist pain. Denies decrease in range of motion,  difficulty with gait, muscle pain or joint swelling.  Skin: Denies redness, rashes, lesions or ulcercations.  Neurological: Denies dizziness, difficulty with memory, difficulty with speech or problems with balance and coordination.  Psych: Denies anxiety, depression, SI/HI.  No other specific complaints in a complete review of systems (except as listed in HPI above).  Objective:   Physical Exam  BP 122/80 mmHg  Pulse 80  Temp(Src) 97.8 F (36.6 C) (Oral)  Ht 5'  4" (1.626 m)  Wt 248 lb 4 oz (112.605 kg)  BMI 42.59 kg/m2 Wt Readings from Last 3 Encounters:  07/20/15 248 lb 4 oz (112.605 kg)  07/16/15 253 lb (114.76 kg)  06/18/15 250 lb 4 oz (113.513 kg)    General: Appears her stated age, obese in NAD. Skin: Warm, very dry but intact. HEENT: Head: normal shape and size; Eyes: sclera white, no icterus, conjunctiva pink, PERRLA and EOMs intact; Ears: Tm's gray and intact, normal light reflex; Throat/Mouth: Teeth present (some missing), mucosa pink and moist, no exudate, lesions or ulcerations noted.  Neck:  Neck supple, trachea midline. No masses, lumps noted. Thyroid surgically absent.  Cardiovascular: Normal rate and rhythm. Distant S1,S2 noted.  No murmur, rubs or gallops noted. No JVD or BLE edema. No carotid bruits noted. Pulmonary/Chest: Normal effort and positive vesicular breath sounds. No respiratory distress. No wheezes, rales or ronchi noted.  Abdomen: Soft and nontender. Normal bowel sounds. No distention or masses noted. Unable to palpate liver, spleen and kidneys due to size. Musculoskeletal: Normal flexion, extension and rotation of the right wrist. Small lump noted on anterior, lateral wrist, tender to palpation. Hand grips equal. NStrength 5/5 BUE/BLE. No difficulty with gait.  Neurological: Alert and oriented. Cranial nerves II-XII grossly intact. Coordination normal.  Psychiatric: Mood and affect normal. Behavior is normal. Judgment and thought content normal.     BMET    Component Value Date/Time   NA 134* 02/03/2014 1631   NA 137 02/18/2013 1554   K 4.3 02/03/2014 1631   K 4.4 02/18/2013 1554   CL 99 02/03/2014 1631   CO2 26 02/03/2014 1631   CO2 25 02/18/2013 1554   GLUCOSE 152* 02/03/2014 1631   GLUCOSE 300* 02/18/2013 1554   BUN 22 02/03/2014 1631   BUN 14.2 02/18/2013 1554   CREATININE 0.76 02/03/2014 1631   CREATININE 0.8 02/18/2013 1554   CALCIUM 9.5 02/03/2014 1631   CALCIUM 9.2 02/18/2013 1554   GFRNONAA >90 01/03/2013 0915   GFRAA >90 01/03/2013 0915    Lipid Panel     Component Value Date/Time   CHOL 222* 09/01/2014 1634   TRIG 317.0* 09/01/2014 1634   HDL 39.00* 09/01/2014 1634   CHOLHDL 6 09/01/2014 1634   VLDL 63.4* 09/01/2014 1634   LDLCALC 70 11/15/2011 0946    CBC    Component Value Date/Time   WBC 9.4 02/18/2013 1554   WBC 8.6 01/03/2013 0915   RBC 4.79 02/18/2013 1554   RBC 4.95 01/03/2013 0915   HGB 12.5 02/18/2013 1554   HGB 13.0 01/03/2013 0915   HCT 39.6 02/18/2013 1554   HCT 40.0 01/03/2013 0915   PLT 306 02/18/2013 1554   PLT 292 01/03/2013 0915   MCV 82.6 02/18/2013 1554   MCV 80.8 01/03/2013 0915   MCH 26.1 02/18/2013 1554   MCH 26.3 01/03/2013 0915   MCHC 31.7 02/18/2013 1554   MCHC 32.5 01/03/2013 0915   RDW 16.4* 02/18/2013 1554   RDW 15.2 01/03/2013 0915   LYMPHSABS 2.0 02/18/2013 1554   LYMPHSABS 2.3 04/04/2011 1241   MONOABS 0.6 02/18/2013 1554   MONOABS 0.5 04/04/2011 1241   EOSABS 0.2 02/18/2013 1554   EOSABS 0.4 04/04/2011 1241   BASOSABS 0.0 02/18/2013 1554   BASOSABS 0.1 04/04/2011 1241    Hgb A1C Lab Results  Component Value Date   HGBA1C 9.4 06/18/2015         Assessment & Plan:   Preventative Health Maintenance:  Flu shot UTD She declines  tetanus booster and pneumovax today Pap smear and mammogram UTD Colonoscopy UTD Encouraged her to consume a balanced diet and start an exercise regimen Encouraged her to continue to see an eye doctor and dentist  annually Will check CBC, CMET and Lipid Profile today She reports she had HIV screening approximately 4 years ago  RTC in 1 year, sooner if needed

## 2015-07-20 NOTE — Assessment & Plan Note (Signed)
Discussed avoiding triggers Encouraged weight loss She does not want to wean Protonix at this time

## 2015-07-20 NOTE — Progress Notes (Signed)
Pre visit review using our clinic review tool, if applicable. No additional management support is needed unless otherwise documented below in the visit note. 

## 2015-07-20 NOTE — Assessment & Plan Note (Signed)
Thyroid hormones recently checked Continue current dose of Synthroid

## 2015-07-20 NOTE — Assessment & Plan Note (Signed)
Follows with Dr. Griffith Citron Getting yearly mammograms

## 2015-07-20 NOTE — Assessment & Plan Note (Signed)
Resolved

## 2015-07-20 NOTE — Assessment & Plan Note (Signed)
Thyroid hormones recently checked Continue your current dose of Synthroid

## 2015-07-20 NOTE — Assessment & Plan Note (Signed)
Encouraged her to consume a low fat diet Lipid Profile and CMET today Continue Crestor, will adjust based on labs if needed

## 2015-07-20 NOTE — Patient Instructions (Signed)
Health Maintenance, Female Adopting a healthy lifestyle and getting preventive care can go a long way to promote health and wellness. Talk with your health care provider about what schedule of regular examinations is right for you. This is a good chance for you to check in with your provider about disease prevention and staying healthy. In between checkups, there are plenty of things you can do on your own. Experts have done a lot of research about which lifestyle changes and preventive measures are most likely to keep you healthy. Ask your health care provider for more information. WEIGHT AND DIET  Eat a healthy diet  Be sure to include plenty of vegetables, fruits, low-fat dairy products, and lean protein.  Do not eat a lot of foods high in solid fats, added sugars, or salt.  Get regular exercise. This is one of the most important things you can do for your health.  Most adults should exercise for at least 150 minutes each week. The exercise should increase your heart rate and make you sweat (moderate-intensity exercise).  Most adults should also do strengthening exercises at least twice a week. This is in addition to the moderate-intensity exercise.  Maintain a healthy weight  Body mass index (BMI) is a measurement that can be used to identify possible weight problems. It estimates body fat based on height and weight. Your health care provider can help determine your BMI and help you achieve or maintain a healthy weight.  For females 20 years of age and older:   A BMI below 18.5 is considered underweight.  A BMI of 18.5 to 24.9 is normal.  A BMI of 25 to 29.9 is considered overweight.  A BMI of 30 and above is considered obese.  Watch levels of cholesterol and blood lipids  You should start having your blood tested for lipids and cholesterol at 50 years of age, then have this test every 5 years.  You may need to have your cholesterol levels checked more often if:  Your lipid  or cholesterol levels are high.  You are older than 50 years of age.  You are at high risk for heart disease.  CANCER SCREENING   Lung Cancer  Lung cancer screening is recommended for adults 55-80 years old who are at high risk for lung cancer because of a history of smoking.  A yearly low-dose CT scan of the lungs is recommended for people who:  Currently smoke.  Have quit within the past 15 years.  Have at least a 30-pack-year history of smoking. A pack year is smoking an average of one pack of cigarettes a day for 1 year.  Yearly screening should continue until it has been 15 years since you quit.  Yearly screening should stop if you develop a health problem that would prevent you from having lung cancer treatment.  Breast Cancer  Practice breast self-awareness. This means understanding how your breasts normally appear and feel.  It also means doing regular breast self-exams. Let your health care provider know about any changes, no matter how small.  If you are in your 20s or 30s, you should have a clinical breast exam (CBE) by a health care provider every 1-3 years as part of a regular health exam.  If you are 40 or older, have a CBE every year. Also consider having a breast X-ray (mammogram) every year.  If you have a family history of breast cancer, talk to your health care provider about genetic screening.  If you   are at high risk for breast cancer, talk to your health care provider about having an MRI and a mammogram every year.  Breast cancer gene (BRCA) assessment is recommended for women who have family members with BRCA-related cancers. BRCA-related cancers include:  Breast.  Ovarian.  Tubal.  Peritoneal cancers.  Results of the assessment will determine the need for genetic counseling and BRCA1 and BRCA2 testing. Cervical Cancer Your health care provider may recommend that you be screened regularly for cancer of the pelvic organs (ovaries, uterus, and  vagina). This screening involves a pelvic examination, including checking for microscopic changes to the surface of your cervix (Pap test). You may be encouraged to have this screening done every 3 years, beginning at age 21.  For women ages 30-65, health care providers may recommend pelvic exams and Pap testing every 3 years, or they may recommend the Pap and pelvic exam, combined with testing for human papilloma virus (HPV), every 5 years. Some types of HPV increase your risk of cervical cancer. Testing for HPV may also be done on women of any age with unclear Pap test results.  Other health care providers may not recommend any screening for nonpregnant women who are considered low risk for pelvic cancer and who do not have symptoms. Ask your health care provider if a screening pelvic exam is right for you.  If you have had past treatment for cervical cancer or a condition that could lead to cancer, you need Pap tests and screening for cancer for at least 20 years after your treatment. If Pap tests have been discontinued, your risk factors (such as having a new sexual partner) need to be reassessed to determine if screening should resume. Some women have medical problems that increase the chance of getting cervical cancer. In these cases, your health care provider may recommend more frequent screening and Pap tests. Colorectal Cancer  This type of cancer can be detected and often prevented.  Routine colorectal cancer screening usually begins at 50 years of age and continues through 50 years of age.  Your health care provider may recommend screening at an earlier age if you have risk factors for colon cancer.  Your health care provider may also recommend using home test kits to check for hidden blood in the stool.  A small camera at the end of a tube can be used to examine your colon directly (sigmoidoscopy or colonoscopy). This is done to check for the earliest forms of colorectal  cancer.  Routine screening usually begins at age 50.  Direct examination of the colon should be repeated every 5-10 years through 50 years of age. However, you may need to be screened more often if early forms of precancerous polyps or small growths are found. Skin Cancer  Check your skin from head to toe regularly.  Tell your health care provider about any new moles or changes in moles, especially if there is a change in a mole's shape or color.  Also tell your health care provider if you have a mole that is larger than the size of a pencil eraser.  Always use sunscreen. Apply sunscreen liberally and repeatedly throughout the day.  Protect yourself by wearing long sleeves, pants, a wide-brimmed hat, and sunglasses whenever you are outside. HEART DISEASE, DIABETES, AND HIGH BLOOD PRESSURE   High blood pressure causes heart disease and increases the risk of stroke. High blood pressure is more likely to develop in:  People who have blood pressure in the high end   of the normal range (130-139/85-89 mm Hg).  People who are overweight or obese.  People who are African American.  If you are 38-23 years of age, have your blood pressure checked every 3-5 years. If you are 61 years of age or older, have your blood pressure checked every year. You should have your blood pressure measured twice--once when you are at a hospital or clinic, and once when you are not at a hospital or clinic. Record the average of the two measurements. To check your blood pressure when you are not at a hospital or clinic, you can use:  An automated blood pressure machine at a pharmacy.  A home blood pressure monitor.  If you are between 45 years and 39 years old, ask your health care provider if you should take aspirin to prevent strokes.  Have regular diabetes screenings. This involves taking a blood sample to check your fasting blood sugar level.  If you are at a normal weight and have a low risk for diabetes,  have this test once every three years after 50 years of age.  If you are overweight and have a high risk for diabetes, consider being tested at a younger age or more often. PREVENTING INFECTION  Hepatitis B  If you have a higher risk for hepatitis B, you should be screened for this virus. You are considered at high risk for hepatitis B if:  You were born in a country where hepatitis B is common. Ask your health care provider which countries are considered high risk.  Your parents were born in a high-risk country, and you have not been immunized against hepatitis B (hepatitis B vaccine).  You have HIV or AIDS.  You use needles to inject street drugs.  You live with someone who has hepatitis B.  You have had sex with someone who has hepatitis B.  You get hemodialysis treatment.  You take certain medicines for conditions, including cancer, organ transplantation, and autoimmune conditions. Hepatitis C  Blood testing is recommended for:  Everyone born from [birth year] through 1965.  Anyone with known risk factors for hepatitis C. Sexually transmitted infections (STIs)  You should be screened for sexually transmitted infections (STIs) including gonorrhea and chlamydia if:  You are sexually active and are younger than 50 years of age.  You are older than 50 years of age and your health care provider tells you that you are at risk for this type of infection.  Your sexual activity has changed since you were last screened and you are at an increased risk for chlamydia or gonorrhea. Ask your health care provider if you are at risk.  If you do not have HIV, but are at risk, it may be recommended that you take a prescription medicine daily to prevent HIV infection. This is called pre-exposure prophylaxis (PrEP). You are considered at risk if:  You are sexually active and do not regularly use condoms or know the HIV status of your partner(s).  You take drugs by injection.  You are sexually  active with a partner who has HIV. Talk with your health care provider about whether you are at high risk of being infected with HIV. If you choose to begin PrEP, you should first be tested for HIV. You should then be tested every 3 months for as long as you are taking PrEP.  PREGNANCY   If you are premenopausal and you may become pregnant, ask your health care provider about preconception counseling.  If you may  become pregnant, take 400 to 800 micrograms (mcg) of folic acid every day.  If you want to prevent pregnancy, talk to your health care provider about birth control (contraception). OSTEOPOROSIS AND MENOPAUSE   Osteoporosis is a disease in which the bones lose minerals and strength with aging. This can result in serious bone fractures. Your risk for osteoporosis can be identified using a bone density scan.  If you are 61 years of age or older, or if you are at risk for osteoporosis and fractures, ask your health care provider if you should be screened.  Ask your health care provider whether you should take a calcium or vitamin D supplement to lower your risk for osteoporosis.  Menopause may have certain physical symptoms and risks.  Hormone replacement therapy may reduce some of these symptoms and risks. Talk to your health care provider about whether hormone replacement therapy is right for you.  HOME CARE INSTRUCTIONS   Schedule regular health, dental, and eye exams.  Stay current with your immunizations.   Do not use any tobacco products including cigarettes, chewing tobacco, or electronic cigarettes.  If you are pregnant, do not drink alcohol.  If you are breastfeeding, limit how much and how often you drink alcohol.  Limit alcohol intake to no more than 1 drink per day for nonpregnant women. One drink equals 12 ounces of beer, 5 ounces of wine, or 1 ounces of hard liquor.  Do not use street drugs.  Do not share needles.  Ask your health care provider for help if  you need support or information about quitting drugs.  Tell your health care provider if you often feel depressed.  Tell your health care provider if you have ever been abused or do not feel safe at home.   This information is not intended to replace advice given to you by your health care provider. Make sure you discuss any questions you have with your health care provider.   Document Released: 07/25/2010 Document Revised: 01/30/2014 Document Reviewed: 12/11/2012 Elsevier Interactive Patient Education [employer].

## 2015-08-05 ENCOUNTER — Other Ambulatory Visit: Payer: Self-pay | Admitting: Internal Medicine

## 2015-08-16 ENCOUNTER — Other Ambulatory Visit: Payer: Self-pay | Admitting: Podiatry

## 2015-08-17 ENCOUNTER — Telehealth: Payer: Self-pay | Admitting: *Deleted

## 2015-08-17 ENCOUNTER — Other Ambulatory Visit: Payer: Self-pay | Admitting: Podiatry

## 2015-08-17 NOTE — Telephone Encounter (Signed)
Called Lyrica 75mg  #60 one capsule bid to [facility].

## 2015-08-17 NOTE — Telephone Encounter (Signed)
Pt needs an appt prior to future refills. 

## 2015-08-19 ENCOUNTER — Encounter: Payer: Self-pay | Admitting: Podiatry

## 2015-08-19 ENCOUNTER — Telehealth: Payer: Self-pay | Admitting: *Deleted

## 2015-08-19 NOTE — Telephone Encounter (Signed)
Pt request refill of Lyrica to get to her [facility] appt.  Orders called to [facility].

## 2015-08-20 DIAGNOSIS — E119 Type 2 diabetes mellitus without complications: Secondary | ICD-10-CM | POA: Diagnosis not present

## 2015-08-30 ENCOUNTER — Ambulatory Visit
Admission: RE | Admit: 2015-08-30 | Discharge: 2015-08-30 | Disposition: A | Discharge status: Home / Self Care | Payer: 59 | Source: Ambulatory Visit | Attending: Oncology | Admitting: Oncology

## 2015-08-30 ENCOUNTER — Other Ambulatory Visit: Payer: Self-pay | Admitting: Oncology

## 2015-08-30 DIAGNOSIS — N6452 Nipple discharge: Secondary | ICD-10-CM

## 2015-08-30 DIAGNOSIS — R921 Mammographic calcification found on diagnostic imaging of breast: Secondary | ICD-10-CM

## 2015-08-31 ENCOUNTER — Ambulatory Visit: Payer: 59 | Admitting: Internal Medicine

## 2015-08-31 ENCOUNTER — Other Ambulatory Visit: Payer: Self-pay | Admitting: Oncology

## 2015-08-31 DIAGNOSIS — R921 Mammographic calcification found on diagnostic imaging of breast: Secondary | ICD-10-CM

## 2015-09-02 ENCOUNTER — Ambulatory Visit
Admission: RE | Admit: 2015-09-02 | Discharge: 2015-09-02 | Disposition: A | Discharge status: Home / Self Care | Payer: 59 | Source: Ambulatory Visit | Attending: Oncology | Admitting: Oncology

## 2015-09-02 DIAGNOSIS — D0512 Intraductal carcinoma in situ of left breast: Secondary | ICD-10-CM | POA: Diagnosis not present

## 2015-09-02 DIAGNOSIS — R920 Mammographic microcalcification found on diagnostic imaging of breast: Secondary | ICD-10-CM | POA: Diagnosis not present

## 2015-09-05 ENCOUNTER — Other Ambulatory Visit: Payer: Self-pay | Admitting: Oncology

## 2015-09-06 ENCOUNTER — Ambulatory Visit: Payer: 59 | Admitting: Podiatry

## 2015-09-07 ENCOUNTER — Other Ambulatory Visit: Payer: Self-pay | Admitting: Internal Medicine

## 2015-09-08 ENCOUNTER — Encounter: Payer: Self-pay | Admitting: Genetic Counselor

## 2015-09-08 ENCOUNTER — Other Ambulatory Visit: Payer: Self-pay | Admitting: Surgery

## 2015-09-08 ENCOUNTER — Other Ambulatory Visit: Payer: Self-pay | Admitting: Oncology

## 2015-09-08 DIAGNOSIS — D0512 Intraductal carcinoma in situ of left breast: Secondary | ICD-10-CM

## 2015-09-09 ENCOUNTER — Other Ambulatory Visit: Payer: Self-pay | Admitting: Surgery

## 2015-09-09 ENCOUNTER — Other Ambulatory Visit: Payer: Self-pay | Admitting: *Deleted

## 2015-09-09 DIAGNOSIS — D0512 Intraductal carcinoma in situ of left breast: Secondary | ICD-10-CM

## 2015-09-10 ENCOUNTER — Ambulatory Visit
Admission: RE | Admit: 2015-09-10 | Discharge: 2015-09-10 | Disposition: A | Discharge status: Home / Self Care | Payer: 59 | Source: Ambulatory Visit | Attending: Surgery | Admitting: Surgery

## 2015-09-10 DIAGNOSIS — N63 Unspecified lump in breast: Secondary | ICD-10-CM | POA: Diagnosis not present

## 2015-09-10 DIAGNOSIS — D0512 Intraductal carcinoma in situ of left breast: Secondary | ICD-10-CM

## 2015-09-10 MED ORDER — GADOBENATE DIMEGLUMINE 529 MG/ML IV SOLN
20.0000 mL | Freq: Once | INTRAVENOUS | Status: AC | PRN
  Administered 2015-09-10: 20 mL via INTRAVENOUS

## 2015-09-12 ENCOUNTER — Other Ambulatory Visit: Payer: Self-pay | Admitting: Oncology

## 2015-09-13 ENCOUNTER — Other Ambulatory Visit: Payer: Self-pay | Admitting: Surgery

## 2015-09-13 DIAGNOSIS — R928 Other abnormal and inconclusive findings on diagnostic imaging of breast: Secondary | ICD-10-CM

## 2015-09-14 ENCOUNTER — Ambulatory Visit: Payer: 59

## 2015-09-14 ENCOUNTER — Encounter: Payer: Self-pay | Admitting: Genetic Counselor

## 2015-09-14 ENCOUNTER — Ambulatory Visit (HOSPITAL_BASED_OUTPATIENT_CLINIC_OR_DEPARTMENT_OTHER): Payer: 59 | Admitting: Genetic Counselor

## 2015-09-14 DIAGNOSIS — Z8349 Family history of other endocrine, nutritional and metabolic diseases: Secondary | ICD-10-CM

## 2015-09-14 DIAGNOSIS — Z8051 Family history of malignant neoplasm of kidney: Secondary | ICD-10-CM | POA: Diagnosis not present

## 2015-09-14 DIAGNOSIS — C50112 Malignant neoplasm of central portion of left female breast: Secondary | ICD-10-CM | POA: Diagnosis not present

## 2015-09-14 DIAGNOSIS — Z801 Family history of malignant neoplasm of trachea, bronchus and lung: Secondary | ICD-10-CM

## 2015-09-14 DIAGNOSIS — Z315 Encounter for genetic counseling: Secondary | ICD-10-CM | POA: Diagnosis not present

## 2015-09-14 DIAGNOSIS — Z87898 Personal history of other specified conditions: Secondary | ICD-10-CM

## 2015-09-14 DIAGNOSIS — Z85850 Personal history of malignant neoplasm of thyroid: Secondary | ICD-10-CM | POA: Diagnosis not present

## 2015-09-14 DIAGNOSIS — Z8489 Family history of other specified conditions: Secondary | ICD-10-CM

## 2015-09-14 DIAGNOSIS — Z842 Family history of other diseases of the genitourinary system: Secondary | ICD-10-CM

## 2015-09-14 DIAGNOSIS — Z8042 Family history of malignant neoplasm of prostate: Secondary | ICD-10-CM

## 2015-09-14 DIAGNOSIS — Z86018 Personal history of other benign neoplasm: Secondary | ICD-10-CM

## 2015-09-14 NOTE — Progress Notes (Signed)
REFERRING PROVIDER: Coralie Keens, MD  OTHER PROVIDER(S): Lurline Del, MD  PRIMARY PROVIDER:  [name], NP  PRIMARY REASON FOR VISIT:  1. Malignant neoplasm of central portion of left female breast ([place])   2. History of thyroid cancer   3. Family history of kidney cancer   4. Family history of prostate cancer   5. Family history of lung cancer   6. Family history of thyroid nodule   7. History of uterine leiomyoma   8. Family history of uterine leiomyoma      HISTORY OF PRESENT ILLNESS:   Stacey Roberts, a 50 y.o. female, was seen for a [facility] cancer genetics consultation at the request of Dr. Ninfa Linden due to a personal history of breast and thyroid cancers and family history of kidney, prostate, and other cancers.  Stacey Roberts presents to clinic today to discuss the possibility of a hereditary predisposition to cancer, genetic testing, and to further clarify her future cancer risks, as well as potential cancer risks for family members.   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 This was treated with total thyroidectomy and radioactive iodine ablation.  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  Hormone receptor status was ER/PR+.  This was treated with lumpectomy and re-excision and tamoxifen. Stacey Roberts will return for another biopsy of her breast next week.   HORMONAL RISK FACTORS:  Menarche was at age 32-14.  First live birth at age - no children.  OCP use - Mirena IUD since August 2014.  Ovaries intact: yes.  Hysterectomy: no.  Menopausal status: uncertain; has a Mirena IUD.  HRT use: 0 years. Colonoscopy: 1st colonoscopy in 03/2015; normal - no polyps found; 10-year schedule. Mammogram within the last year: yes. Number of breast biopsies: multiple. Up to date with pelvic  exams:  yes. Any excessive radiation exposure/other exposures in the past:  History of radioactive iodine ablation therapy in treatment of thyroid cancer; some history of secondhand smoke exposure  Past Medical History:  Diagnosis Date  . Allergy    seasonal  . Bronchitis    hx of  . Diabetes mellitus, type II ([place])   . GERD (gastroesophageal reflux disease)   . Hyperlipidemia   . Hypertension   . Hypothyroidism   . Obesity   . Sleep apnea 2008   severe OSA-does not use a cpap  . Snoring   . 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 Buddy Duty    Past Surgical History:  Procedure Laterality Date  . BREAST BIOPSY  2000   s/p  . BREAST BIOPSY Left 01/22/2013   Procedure: RE-EXCISION LEFT BREAST DCIS;  Surgeon: Harl Bowie, MD;  Location: [facility];  Service: General;  Laterality: Left;  . BREAST LUMPECTOMY Left 01/06/2013   Procedure: LUMPECTOMY;  Surgeon: Harl Bowie, MD;  Location: [facility];  Service: General;  Laterality: Left;  . DILATION AND CURETTAGE OF UTERUS  2004   s/p  . THYROIDECTOMY  02/2009   Dr Harlow Asa  . TONSILLECTOMY  1982    Social History   Social History  . Marital status: Single    Spouse name: N/A  . Number of children: N/A  . Years of education: N/A  Occupational History  .  [employer]    Outpatient scheduling in radiology   Social History Main Topics  . Smoking status: Former Smoker    Packs/day: 0.50    Types: Cigarettes  . Smokeless tobacco: Never Used     Comment: Smoked on and off for 20 years. Quit about 8 years ago (as of 08/2015)  . Alcohol use 0.0 oz/week     Comment: occasionally  . Drug use: No  . Sexual activity: Yes   Other Topics Concern  . None   Social History Narrative   The patient is divorced, moved to [place] in   2006 from [place].  She does not have any children, currently  lives   with her boyfriend and is working as a Nurse, adult for [employer].    No alcohol.  Tobacco use:  She quit 5 years ago.  She smoked on   and off for approximately 20 years.  No history of recreational drug   Use.Drinks two cups of coffee per work day.      FAMILY HISTORY:  We obtained a detailed, 4-generation family history.  Significant diagnoses are listed below: Family History  Problem Relation Age of Onset  . Dementia Father     deceased age 24 secondary to dementia  . Bipolar disorder Father   . Emphysema Father   . Heart disease Father   . COPD Father     smoker  . CAD Mother 65    Died age 57 of CAD  . Heart disease Mother   . Graves' disease Mother   . CAD Maternal Grandfather 60  . Heart disease Maternal Grandfather   . Other Sister 31    hysterectomy for fibroids  . Prostate cancer Brother     low grade; w/ surveillance  . Thyroid nodules Brother 14  . Fibroids Other     niece dx approx 108  . Kidney cancer Cousin     maternal 1st cousin dx 78-47; former smoker  . Lung cancer Other     maternal great aunt (MGM's sister); not a smoker  . Colon cancer Neg Hx   . Colon polyps Neg Hx     Stacey Roberts has two full sisters and two full brothers, ages 57-61.  One brother was recently diagnosed with a "low grade" prostate cancer at age 67.  He was also recently diagnosed with thyroid nodules.  Stacey Roberts reports that her oldest sister has a history of fibroids that was treated with a hysterectomy at the age of 59.  One of Stacey Roberts's nieces has also been diagnosed with fibroids.    Stacey Roberts's mother died of heart disease at the age of 36.  She also had a history of Graves' disease and she was a smoker.  Stacey Roberts mother had one full brother who is currently in his early 56s and has never been diagnosed with cancer.  This brother had two daughters.  One of his daughters, a former smoker, was diagnosed with kidney cancer at the age of 51-47.  Ms.  Roberts's maternal grandmother died following pneumonia and complications from a surgery between the age of 63-75.  She had one full sister who had a history of lung cancer.  Stacey Roberts's maternal grandfather died of heart diease at age 36-67. He had four full brothers, none of whom were ever diagnosed with cancer to Ms. Fulco's knowledge.    Ms. Aul's father died of COPD and emphysema.  He was a smoker and also had bipolar disorder and dementia.  He had one full brother and one maternal half-brother.  His full brother is currently 3-75 and has never had cancer.  His half-brother is currently 41 and has not had cancer.  Ms. Habermann's paternal grandmother died of age-related causes in her early 8s.  Ms. Escalante's paternal grandfather died in the war when her father was a child.  Ms. Weed has no further information for her paternal great aunts/uncles and great grandparents.    Patient's maternal ancestors are of [place] descent, and paternal ancestors are of [place] descent. There is no reported Ashkenazi Jewish ancestry. There is no known consanguinity.  GENETIC COUNSELING ASSESSMENT: DENAJAH FARIAS is a 50 y.o. female with a personal and family history of cancer which is somewhat suggestive of a hereditary cancer syndrome and predisposition to cancer. We, therefore, discussed and recommended the following at today's visit.   DISCUSSION: We reviewed the characteristics, features and inheritance patterns of hereditary cancer syndromes, particularly that caused by mutations within the PTEN gene. We also discussed genetic testing, including the appropriate family members to test, the process of testing, insurance coverage and turn-around-time for results. We discussed the implications of a negative, positive and/or variant of uncertain significant result. Due to Ms. Gingerich's early-onset diagnosis of thyroid and breast cancers, we also discussed analysis of other breast and thyroid  cancer-related genes.  Ms. Quito is interested in this more comprehensive testing option.  Thus, we recommended Ms. Decook pursue genetic testing for the 42-gene panel [facility] Common Hereditary Cancers Panel (Breast, Gyn, GI).  The 42-gene [facility] Common Hereditary Cancers Panel (Breast, Gyn, GI) performed by [facility], [place]) includes sequencing and/or deletion/duplication analysis for the following genes: APC, ATM, AXIN2, BARD1, BMPR1A, BRCA1, BRCA2, BRIP1, CDH1, CDKN2A, CHEK2, DICER1, EPCAM, GREM1, KIT, MEN1, MLH1, MSH2, MSH6, MUTYH, NBN, NF1, PALB2, PDGFRA, PMS2, POLD1, POLE, PTEN, RAD50, RAD51C, RAD51D, SDHA, SDHB, SDHC, SDHD, SMAD4, SMARCA4, STK11, TP53, TSC1, TSC2, and VHL.  Based on Ms. Kellis's personal and family history of cancer, she meets medical criteria for genetic testing. Despite that she meets criteria, she may still have an out of pocket cost. We discussed that she does not meet her insurance criteria for coverage of comprehensive genetic testing.  Thus, we discussed self-pay comprehensive genetic testing through [facility] for $250.  We discussed that upon receipt of the test order, [facility] will email her an invoice for this cost.    PLAN: After considering the risks, benefits, and limitations, Ms. Iannelli  provided informed consent to pursue genetic testing and the blood sample was sent to [facility] for analysis of the 42-gene [facility] Common Hereditary Cancer Panel (Breast, Gyn, GI). Results should be available within approximately 2-3 weeks' time, at which point they will be disclosed by telephone to Ms. Hines, as will any additional recommendations warranted by these results. Ms. Heber will receive a summary of her genetic counseling visit and a copy of her results once available. This information will also be available in Epic. We encouraged Ms. Eilers to remain in contact with cancer genetics annually so that we can  continuously update the family history and inform her of any changes in cancer genetics and testing that may be of benefit for her family. Ms. Bhullar's questions were answered to her satisfaction today. Our contact information was provided should additional questions or concerns arise.  Thank you for the referral and allowing [place] to share in the care  of your patient.   Jeanine Luz, MS, [facility] Certified Genetic Counselor [email or web address] Phone: [phone]  The patient was seen for a total of 60 minutes in face-to-face genetic counseling.  This patient was discussed with Drs. Magrinat, Lindi Adie and/or Burr Medico who agrees with the above.    _______________________________________________________________________ For Office Staff:  Number of people involved in session: 1 Was an Intern/ student involved with case: no

## 2015-09-15 ENCOUNTER — Telehealth: Payer: Self-pay | Admitting: Oncology

## 2015-09-15 NOTE — Telephone Encounter (Signed)
Spoke with patient re appointment for 9/1 @ 10 am. Date/time/no lab per staff message from [facility]. Referral from [facility] - not a new patient.

## 2015-09-16 ENCOUNTER — Ambulatory Visit
Admission: RE | Admit: 2015-09-16 | Discharge: 2015-09-16 | Disposition: A | Discharge status: Home / Self Care | Payer: 59 | Source: Ambulatory Visit | Attending: Surgery | Admitting: Surgery

## 2015-09-16 ENCOUNTER — Other Ambulatory Visit: Payer: Self-pay | Admitting: Surgery

## 2015-09-16 ENCOUNTER — Ambulatory Visit: Payer: 59 | Admitting: Endocrinology

## 2015-09-16 DIAGNOSIS — N641 Fat necrosis of breast: Secondary | ICD-10-CM | POA: Diagnosis not present

## 2015-09-16 DIAGNOSIS — R928 Other abnormal and inconclusive findings on diagnostic imaging of breast: Secondary | ICD-10-CM

## 2015-09-16 MED ORDER — GADOBENATE DIMEGLUMINE 529 MG/ML IV SOLN
20.0000 mL | Freq: Once | INTRAVENOUS | Status: AC | PRN
Start: 2015-09-16 — End: 2015-09-16
  Administered 2015-09-16: 20 mL via INTRAVENOUS

## 2015-09-17 ENCOUNTER — Ambulatory Visit: Payer: 59 | Admitting: Endocrinology

## 2015-09-20 ENCOUNTER — Ambulatory Visit (HOSPITAL_BASED_OUTPATIENT_CLINIC_OR_DEPARTMENT_OTHER): Admission: RE | Admit: 2015-09-20 | Payer: 59 | Source: Ambulatory Visit | Admitting: Surgery

## 2015-09-20 ENCOUNTER — Encounter (HOSPITAL_BASED_OUTPATIENT_CLINIC_OR_DEPARTMENT_OTHER): Admission: RE | Payer: Self-pay | Source: Ambulatory Visit

## 2015-09-20 ENCOUNTER — Ambulatory Visit (HOSPITAL_COMMUNITY): Payer: 59

## 2015-09-20 ENCOUNTER — Ambulatory Visit: Payer: 59 | Admitting: Podiatry

## 2015-09-20 SURGERY — BREAST LUMPECTOMY WITH RADIOACTIVE SEED AND SENTINEL LYMPH NODE BIOPSY
Anesthesia: General | Site: Breast | Laterality: Left

## 2015-09-20 MED FILL — CEPHALEXIN 500 MG CAPSULE: 500 | 10 days supply | Qty: 40 | Fill #0

## 2015-09-20 MED FILL — HYDROCODON-APAP 5-325: 5-325 | 2 days supply | Qty: 12 | Fill #0

## 2015-09-21 ENCOUNTER — Encounter: Payer: Self-pay | Admitting: Plastic Surgery

## 2015-09-21 DIAGNOSIS — D0512 Intraductal carcinoma in situ of left breast: Secondary | ICD-10-CM | POA: Diagnosis not present

## 2015-09-22 ENCOUNTER — Telehealth: Payer: Self-pay | Admitting: Genetic Counselor

## 2015-09-22 ENCOUNTER — Ambulatory Visit: Payer: 59 | Admitting: Podiatry

## 2015-09-23 ENCOUNTER — Ambulatory Visit (INDEPENDENT_AMBULATORY_CARE_PROVIDER_SITE_OTHER): Payer: 59 | Admitting: Endocrinology

## 2015-09-23 ENCOUNTER — Encounter: Payer: Self-pay | Admitting: Endocrinology

## 2015-09-23 ENCOUNTER — Other Ambulatory Visit: Payer: Self-pay | Admitting: Surgery

## 2015-09-23 VITALS — BP 132/74 | HR 78 | Wt 250.0 lb

## 2015-09-23 DIAGNOSIS — Z794 Long term (current) use of insulin: Secondary | ICD-10-CM

## 2015-09-23 DIAGNOSIS — D0512 Intraductal carcinoma in situ of left breast: Secondary | ICD-10-CM

## 2015-09-23 DIAGNOSIS — E1142 Type 2 diabetes mellitus with diabetic polyneuropathy: Secondary | ICD-10-CM

## 2015-09-23 LAB — POCT GLYCOSYLATED HEMOGLOBIN (HGB A1C): HEMOGLOBIN A1C: 8.6

## 2015-09-23 MED ORDER — INSULIN GLARGINE 100 UNIT/ML SOLOSTAR PEN: 60.0000 [IU] | PEN_INJECTOR | SUBCUTANEOUS | 11 refills | Status: DC

## 2015-09-23 NOTE — Progress Notes (Signed)
Subjective:    Patient ID: Stacey Roberts, female    DOB: 09/29/1965, 50 y.o.   MRN: [id number]  HPI Pt has stage-1 papillary adenocarcinoma of the thyroid.    2/11: thyroidectomy: T1b N0 M0.  4/11: i-131 103 mci, with thyrogen.  12/11: neck US: single enlarged right cervical lymph node, nonspecific.   6/12: body scan (thyrogen) neg.   12/12: [place]: lymph node is stable 6/13: [place]: overall node morphology and volume show very little change.   9/14  TG undetectable (ab neg) 3/15: TG undetectable (ab neg) 10/15 TG undetectable (ab neg) 3/17 TG undetectable (ab neg)  Pt returns for f/u of diabetes mellitus: DM type: Insulin-requiring type 2 Dx'ed: [id number] Complications: painful neuropathy of the lower extremities Therapy: insulin since early 2017 (also byetta and 2 oral meds) GDM: never DKA: never Severe hypoglycemia: never.   Pancreatitis: never Other: she declines multiple daily injections  she declines weight-loss surgery; edema precudes pioglitizone rx.  Interval history: no cbg record, but states cbg's vary from 100-200's.  There is no trend throughout the day. She says she never misses the insulin.  She needs better glycemic control, especially in view of upcoming surgery (breast lumpectomy).  She has increased lantus to 60 units qam.   Past Medical History:  Diagnosis Date  . Allergy    seasonal  . Bronchitis    hx of  . Diabetes mellitus, type II ([place])   . GERD (gastroesophageal reflux disease)   . Hyperlipidemia   . Hypertension   . Hypothyroidism   . Obesity   . Sleep apnea 2008   severe OSA-does not use a cpap  . Snoring   . 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 Buddy Duty    Past Surgical History:  Procedure Laterality Date  . BREAST BIOPSY  2000   s/p  . BREAST BIOPSY Left 01/22/2013   Procedure: RE-EXCISION LEFT BREAST DCIS;  Surgeon: Harl Bowie, MD;   Location: [place];  Service: General;  Laterality: Left;  . BREAST LUMPECTOMY Left 01/06/2013   Procedure: LUMPECTOMY;  Surgeon: Harl Bowie, MD;  Location: [facility];  Service: General;  Laterality: Left;  . DILATION AND CURETTAGE OF UTERUS  2004   s/p  . THYROIDECTOMY  02/2009   Dr Harlow Asa  . TONSILLECTOMY  1982    Social History   Social History  . Marital status: Single    Spouse name: N/A  . Number of children: N/A  . Years of education: N/A   Occupational History  .  [employer]    Outpatient scheduling in radiology   Social History Main Topics  . Smoking status: Former Smoker    Packs/day: 0.50    Types: Cigarettes  . Smokeless tobacco: Never Used     Comment: Smoked on and off for 20 years. Quit about 8 years ago (as of 08/2015)  . Alcohol use 0.0 oz/week     Comment: occasionally  . Drug use: No  . Sexual activity: Yes   Other Topics Concern  . Not on file   Social History Narrative   The patient is divorced, moved to [place] in   2006 from [place].  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    No alcohol.  Tobacco use:  She quit  5 years ago.  She smoked on   and off for approximately 20 years.  No history of recreational drug   Use.Drinks two cups of coffee per work day.     Current Outpatient Prescriptions on File Prior to Visit  Medication Sig Dispense Refill  . aspirin 81 MG tablet Take 81 mg by mouth daily.      . canagliflozin (INVOKANA) 300 MG TABS tablet Take 1 tablet (300 mg total) by mouth daily. 30 tablet 2  . exenatide (BYETTA 10 MCG PEN) 10 MCG/0.04ML SOPN injection Inject 0.04 mLs (10 mcg total) into the skin 2 (two) times daily with a meal. And pen needles 2/day 2.4 mL 11  . levothyroxine (SYNTHROID, LEVOTHROID) 150 MCG tablet TAKE 1 TABLET BY MOUTH DAILY BEFORE BREAKFAST 30 tablet 3  . LYRICA 75 MG capsule TAKE 1 CAPSULE BY  MOUTH TWICE A DAY 60 capsule 0  . metFORMIN (GLUCOPHAGE) 1000 MG tablet TAKE 1 TABLET BY MOUTH 2 TIMES DAILY WITH A MEAL. 60 tablet 6  . Multiple Vitamin (MULTIVITAMIN WITH MINERALS) TABS tablet Take 1 tablet by mouth daily.    Marland Kitchen olmesartan (BENICAR) 20 MG tablet TAKE 1 TABLET BY MOUTH DAILY 30 tablet 10  . ONE TOUCH LANCETS MISC Use to check blood sugar three time a day     . pantoprazole (PROTONIX) 40 MG tablet TAKE 1 TABLET BY MOUTH ONCE DAILY 90 tablet 2  . rosuvastatin (CRESTOR) 10 MG tablet Take 1 tablet (10 mg total) by mouth daily. 90 tablet 3  . TRUETEST TEST test strip USE AS DIRECTED EACH DAY 100 each 0  . [DISCONTINUED] Calcium Carbonate 1500 MG TABS Take 1,500 mg by mouth daily.       No current facility-administered medications on file prior to visit.     Allergies  Allergen Reactions  . Penicillins Shortness Of Breath and Rash  . Clindamycin/Lincomycin Other (See Comments)    Severe stomach cramps  . Ace Inhibitors Cough    REACTION: cough; denies airway involvement     Family History  Problem Relation Age of Onset  . Dementia Father     deceased age 60 secondary to dementia  . Bipolar disorder Father   . Emphysema Father   . Heart disease Father   . COPD Father     smoker  . CAD Mother 54    Died age 45 of CAD  . Heart disease Mother   . Graves' disease Mother   . CAD Maternal Grandfather 60  . Heart disease Maternal Grandfather   . Other Sister 63    hysterectomy for fibroids  . Prostate cancer Brother     low grade; w/ surveillance  . Thyroid nodules Brother 3  . Fibroids Other     niece dx approx 31  . Kidney cancer Cousin     maternal 1st cousin dx 59-47; former smoker  . Lung cancer Other     maternal great aunt (MGM's sister); not a smoker  . Colon cancer Neg Hx   . Colon polyps Neg Hx     BP 132/74   Pulse 78   Wt 250 lb (113.4 kg)   BMI 42.91 kg/m    Review of Systems She denies hypoglycemia.     Objective:   Physical  Exam VITAL SIGNS:  See vs page GENERAL: no distress Pulses: dorsalis pedis intact bilat.   MSK: no deformity of the feet CV: no leg edema.  Skin:  no ulcer on the feet.  normal color and temp on the feet. Neuro: sensation is intact to touch on the feet.     A1c=8.6%    Assessment & Plan:  Type 2 DM: she needs increased rx

## 2015-09-23 NOTE — Patient Instructions (Addendum)
check your blood sugar once a day.  vary the time of day when you check, between before the 3 meals, and at bedtime.  also check if you have symptoms of your blood sugar being too high or too low.  please keep a record of the readings and bring it to your next appointment here.  You can write it on any piece of paper.  please call us sooner if your blood sugar goes below 70, or if you have a lot of readings over 200.  Please increase the lantus to 60 units each morning.  Please continue the same other medications for diabetes.  Please come back for a follow-up appointment in 2 months.

## 2015-09-24 ENCOUNTER — Telehealth: Payer: Self-pay | Admitting: Oncology

## 2015-09-24 ENCOUNTER — Ambulatory Visit: Payer: Self-pay | Admitting: Genetic Counselor

## 2015-09-24 ENCOUNTER — Ambulatory Visit (HOSPITAL_BASED_OUTPATIENT_CLINIC_OR_DEPARTMENT_OTHER): Payer: 59 | Admitting: Oncology

## 2015-09-24 ENCOUNTER — Ambulatory Visit: Payer: 59

## 2015-09-24 VITALS — BP 117/60 | HR 83 | Temp 97.7°F | Resp 18 | Ht 64.0 in | Wt 252.3 lb

## 2015-09-24 DIAGNOSIS — Z85850 Personal history of malignant neoplasm of thyroid: Secondary | ICD-10-CM

## 2015-09-24 DIAGNOSIS — C73 Malignant neoplasm of thyroid gland: Secondary | ICD-10-CM

## 2015-09-24 DIAGNOSIS — Z170 Estrogen receptor positive status [ER+]: Secondary | ICD-10-CM

## 2015-09-24 DIAGNOSIS — C50112 Malignant neoplasm of central portion of left female breast: Secondary | ICD-10-CM

## 2015-09-24 DIAGNOSIS — Z8042 Family history of malignant neoplasm of prostate: Secondary | ICD-10-CM

## 2015-09-24 DIAGNOSIS — D0512 Intraductal carcinoma in situ of left breast: Secondary | ICD-10-CM | POA: Diagnosis not present

## 2015-09-24 DIAGNOSIS — Z8051 Family history of malignant neoplasm of kidney: Secondary | ICD-10-CM

## 2015-09-24 DIAGNOSIS — Z1379 Encounter for other screening for genetic and chromosomal anomalies: Secondary | ICD-10-CM

## 2015-09-24 HISTORY — PX: REDUCTION MAMMAPLASTY: SUR839

## 2015-09-24 NOTE — Telephone Encounter (Signed)
appt made and avs printed °

## 2015-09-24 NOTE — Progress Notes (Signed)
[place]  [phone] [phone]     ID: [name] DOB: 1965/07/22  MR#: [id number]  WNI#:[id number]  Patient Care Team: Stacey Fenton, NP as PCP - General (Internal Medicine) Stacey Roberts, [facility] as [place] Management (Pharmacist) Stacey Cruel, MD as Consulting Physician (Oncology) Stacey Keens, MD as Consulting Physician (General Surgery) Stacey Lose, MD as Consulting Physician (Obstetrics and Gynecology) Stacey Pray, MD as Consulting Physician (Radiation Oncology) Stacey Shin, MD as Consulting Physician (Endocrinology) Stacey Shipper, MD as Consulting Physician (Gastroenterology) Stacey Limbo, MD as Consulting Physician (Plastic Surgery) OTHER MD:  CHIEF COMPLAINT: Recurrent ductal carcinoma in situ  CURRENT TREATMENT: Awaiting definitive surgery   BREAST CANCER HISTORY: Stacey Roberts has a 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 She had left lumpectomy for that lesion 01/06/2013, showing a low-grade noninvasive ductal carcinoma which was estrogen receptor 99% and progesterone receptor 100% positive. Margins were focally positive and she had further excision 01/22/2013 which showed residual focal ductal carcinoma in situ 1 mm from the posterior margin. The patient refused adjuvant radiation and did not tolerate tamoxifen, which she took for approximately 2 months. She was last seen in our office in 2015.  Because of that history she receives diagnostic bilateral mammography yearly. Her 02/15/2015 study found the breast density to be category A. This study was negative. However in early August 2017 she developed a new left nipple discharge, which at times was bloody. Repeat left mammography with tomography and left breast ultrasonography 08/30/2015 now read the breast density to be category B. Aside some medial left breast masses and postlumpectomy findings, none of which  showed any change since 2012, there were new pleomorphic calcifications posterior to the lumpectomy area 1.4 cm. On exam there was no palpable lump. Ultrasound showed scarring behind the nipple which was unchanged from prior, but no definite mass.  Biopsy of the left breast upper outer quadrant area of calcifications 09/02/2015 showed ([id number]) ductal carcinoma in situ, grade 1. Focal invasion could not be excluded. There was not enough tissue for a prognostic panel.  On 09/10/2015 the patient underwent bilateral breast MRI with and without contrast. This again found the breast density to be category B. There was an area of non-masslike enhancement involving the upper outer quadrant of the left breast from the nipple posteriorly extending approximately 10 cm. The right breast was unremarkable. There was no evidence of adenopathy. MRI guided biopsy of the upper-outer quadrant retroareolar area of the left breast 09/16/2015 showed fat necrosis but no evidence of malignancy ([id number]).  Stacey Roberts's subsequent history is as detailed below  INTERVAL HISTORY: Stacey Roberts was evaluated in the breast clinic 09/24/2015. Her case was also presented in the multidisciplinary breast cancer conference 09/08/2015 At that time a preliminary plan was proposed: MRI, then if possible repeat lumpectomy with sentinel lymph node sampling, followed by radiation and anti-estrogens. Genetics was also suggested.  REVIEW OF SYSTEMS: Stacey Roberts denies unusual headaches, visual changes, nausea, vomiting, stiff neck, dizziness, or gait imbalance. There has been no cough, phlegm production, or pleurisy, no chest pain or pressure, and no change in bowel or bladder habits. The patient denies fever, rash, bleeding, unexplained fatigue or unexplained weight loss. A detailed review of systems was otherwise entirely negative.   PAST MEDICAL HISTORY: Past Medical History:  Diagnosis Date  . Allergy    seasonal  . Bronchitis    hx of  .  Diabetes  mellitus, type II ([place])   . GERD (gastroesophageal reflux disease)   . Hyperlipidemia   . Hypertension   . Hypothyroidism   . Obesity   . Sleep apnea 2008   severe OSA-does not use a cpap  . Snoring   . 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 Buddy Duty    PAST SURGICAL HISTORY: Past Surgical History:  Procedure Laterality Date  . BREAST BIOPSY  2000   s/p  . BREAST BIOPSY Left 01/22/2013   Procedure: RE-EXCISION LEFT BREAST DCIS;  Surgeon: Harl Bowie, MD;  Location: [facility];  Service: General;  Laterality: Left;  . BREAST LUMPECTOMY Left 01/06/2013   Procedure: LUMPECTOMY;  Surgeon: Harl Bowie, MD;  Location: [facility];  Service: General;  Laterality: Left;  . DILATION AND CURETTAGE OF UTERUS  2004   s/p  . THYROIDECTOMY  02/2009   Dr Harlow Asa  . TONSILLECTOMY  1982    FAMILY HISTORY Family History  Problem Relation Age of Onset  . Dementia Father     deceased age 4 secondary to dementia  . Bipolar disorder Father   . Emphysema Father   . Heart disease Father   . COPD Father     smoker  . CAD Mother 73    Died age 77 of CAD  . Heart disease Mother   . Graves' disease Mother   . CAD Maternal Grandfather 60  . Heart disease Maternal Grandfather   . Other Sister 5    hysterectomy for fibroids  . Prostate cancer Brother     low grade; w/ surveillance  . Thyroid nodules Brother 51  . Fibroids Other     niece dx approx 4  . Kidney cancer Cousin     maternal 1st cousin dx 42-47; former smoker  . Lung cancer Other     maternal great aunt (MGM's sister); not a smoker  . Colon cancer Neg Hx   . Colon polyps Neg Hx   The patient's father died at the age of 19 from myocardial infarction in the setting of dementia. The patient's mother died at the age of 73 from heart disease. Stacey Roberts has 2 brothers, 2 sisters. There is no history of  breast ovarian or colon cancer in the family. Of course she has a history of thyroid cancer  GYNECOLOGIC HISTORY:  No LMP recorded. Patient is not currently having periods (Reason: IUD). Menarche age 43, she is GX P0. She has a Mirena in place.  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 She is divorced but shares a home with her Wolford, who works as a Orthoptist. He has no children of his own. Together they have 1. The patient is not a church attender.    ADVANCED DIRECTIVES: At the 09/24/2015 visit the patient was given the appropriate forms to complete. She intends to name her [place] as her healthcare part of attorney  HEALTH MAINTENANCE: Social History  Substance Use Topics  . Smoking status: Former Smoker    Packs/day: 0.50    Types: Cigarettes  . Smokeless tobacco: Never Used     Comment: Smoked on and off for 20 years. Quit about 8 years ago (as of 08/2015)  . Alcohol use 0.0 oz/week     Comment: occasionally     Colonoscopy:  PAP:  Bone density:  Allergies  Allergen Reactions  . Penicillins Shortness Of Breath and Rash  . Clindamycin/Lincomycin Other (See Comments)    Severe stomach cramps  . Ace Inhibitors Cough    REACTION: cough; denies airway involvement     Current Outpatient Prescriptions  Medication Sig Dispense Refill  . aspirin 81 MG tablet Take 81 mg by mouth daily.      . canagliflozin (INVOKANA) 300 MG TABS tablet Take 1 tablet (300 mg total) by mouth daily. 30 tablet 2  . exenatide (BYETTA 10 MCG PEN) 10 MCG/0.04ML SOPN injection Inject 0.04 mLs (10 mcg total) into the skin 2 (two) times daily with a meal. And pen needles 2/day 2.4 mL 11  . Insulin Glargine (LANTUS SOLOSTAR) 100 UNIT/ML Solostar Pen Inject 60 Units into the skin every morning. And pen needles 1/day 10 pen 11  . levothyroxine (SYNTHROID, LEVOTHROID) 150 MCG tablet TAKE 1 TABLET BY MOUTH DAILY BEFORE  BREAKFAST 30 tablet 3  . LYRICA 75 MG capsule TAKE 1 CAPSULE BY MOUTH TWICE A DAY 60 capsule 0  . metFORMIN (GLUCOPHAGE) 1000 MG tablet TAKE 1 TABLET BY MOUTH 2 TIMES DAILY WITH A MEAL. 60 tablet 6  . Multiple Vitamin (MULTIVITAMIN WITH MINERALS) TABS tablet Take 1 tablet by mouth daily.    Marland Kitchen olmesartan (BENICAR) 20 MG tablet TAKE 1 TABLET BY MOUTH DAILY 30 tablet 10  . ONE TOUCH LANCETS MISC Use to check blood sugar three time a day     . pantoprazole (PROTONIX) 40 MG tablet TAKE 1 TABLET BY MOUTH ONCE DAILY 90 tablet 2  . rosuvastatin (CRESTOR) 10 MG tablet Take 1 tablet (10 mg total) by mouth daily. 90 tablet 3  . TRUETEST TEST test strip USE AS DIRECTED EACH DAY 100 each 0   No current facility-administered medications for this visit.     OBJECTIVE: Middle-aged white woman who appears stated age 69:   09/24/15 1011  BP: 117/60  Pulse: 83  Resp: 18  Temp: 97.7 F (36.5 C)     Body mass index is 43.31 kg/m.    ECOG FS:1 - Symptomatic but completely ambulatory  Ocular: Sclerae unicteric, pupils equal, round and reactive to light Ear-nose-throat: Oropharynx clear and moist Lymphatic: No cervical or supraclavicular adenopathy Lungs no rales or rhonchi, good excursion bilaterally Heart regular rate and rhythm, no murmur appreciated Abd soft, nontender, positive bowel sounds MSK no focal spinal tenderness, no joint edema Neuro: non-focal, well-oriented, appropriate affect Breasts: The right breast is unremarkable. The left breast is status post recent biopsy. I cannot express any discharge from the nipple. There is no palpable mass. Left axilla is benign.   LAB RESULTS:  CMP     Component Value Date/Time   NA 140 07/20/2015 0858   NA 137 02/18/2013 1554   K 4.8 07/20/2015 0858   K 4.4 02/18/2013 1554   CL 103 07/20/2015 0858   CO2 32 07/20/2015 0858   CO2 25 02/18/2013 1554   GLUCOSE 194 (H) 07/20/2015 0858   GLUCOSE 300 (H) 02/18/2013 1554   BUN 16 07/20/2015 0858     BUN 14.2 02/18/2013 1554   CREATININE 0.67 07/20/2015 0858   CREATININE 0.8 02/18/2013 1554   CALCIUM 9.7 07/20/2015 0858   CALCIUM 9.2 02/18/2013 1554   PROT 7.5 07/20/2015 0858   PROT 7.2 02/18/2013 1554   ALBUMIN 4.1 07/20/2015 0858   ALBUMIN 3.6 02/18/2013 1554   AST 10 07/20/2015 0858   AST 10 02/18/2013 1554   ALT 12 07/20/2015  0858   ALT 16 02/18/2013 1554   ALKPHOS 74 07/20/2015 0858   ALKPHOS 85 02/18/2013 1554   BILITOT 0.5 07/20/2015 0858   BILITOT 0.33 02/18/2013 1554   GFRNONAA >90 01/03/2013 0915   GFRAA >90 01/03/2013 0915    INo results found for: SPEP, UPEP  Lab Results  Component Value Date   WBC 9.7 07/20/2015   NEUTROABS 6.7 (H) 02/18/2013   HGB 14.0 07/20/2015   HCT 43.1 07/20/2015   MCV 83.9 07/20/2015   PLT 295.0 07/20/2015      Chemistry      Component Value Date/Time   NA 140 07/20/2015 0858   NA 137 02/18/2013 1554   K 4.8 07/20/2015 0858   K 4.4 02/18/2013 1554   CL 103 07/20/2015 0858   CO2 32 07/20/2015 0858   CO2 25 02/18/2013 1554   BUN 16 07/20/2015 0858   BUN 14.2 02/18/2013 1554   CREATININE 0.67 07/20/2015 0858   CREATININE 0.8 02/18/2013 1554      Component Value Date/Time   CALCIUM 9.7 07/20/2015 0858   CALCIUM 9.2 02/18/2013 1554   ALKPHOS 74 07/20/2015 0858   ALKPHOS 85 02/18/2013 1554   AST 10 07/20/2015 0858   AST 10 02/18/2013 1554   ALT 12 07/20/2015 0858   ALT 16 02/18/2013 1554   BILITOT 0.5 07/20/2015 0858   BILITOT 0.33 02/18/2013 1554       No results found for: LABCA2  No components found for: LABCA125  No results for input(s): INR in the last 168 hours.  Urinalysis    Component Value Date/Time   COLORURINE YELLOW 03/15/2009 0835   APPEARANCEUR CLOUDY (A) 03/15/2009 0835   LABSPEC 1.024 03/15/2009 0835   PHURINE 7.5 03/15/2009 0835   GLUCOSEU NEGATIVE 03/15/2009 0835   GLUCOSEU NEGATIVE 01/09/2008 1524   HGBUR NEGATIVE 03/15/2009 0835   BILIRUBINUR NEGATIVE 03/15/2009 0835   KETONESUR  NEGATIVE 03/15/2009 0835   PROTEINUR NEGATIVE 03/15/2009 0835   UROBILINOGEN 1.0 03/15/2009 0835   NITRITE NEGATIVE 03/15/2009 0835   LEUKOCYTESUR TRACE (A) 03/15/2009 0835     STUDIES: Mr Breast Bilateral W Wo Contrast  Addendum Date: 09/10/2015   ADDENDUM REPORT: 09/10/2015 09:37 ADDENDUM: In the comparison section, there is a prior post lumpectomy MRI dated 01/18/2013 which is correlated. Electronically Signed   By: Evangeline Dakin M.D.   On: 09/10/2015 09:37   Result Date: 09/10/2015 CLINICAL DATA:  50 year old who presented in 2014 with a spontaneous nipple discharge, found at that time to have a papilloma associated with DCIS for which she underwent lumpectomy. Recent diagnostic mammography demonstrated new calcifications in the upper outer quadrant, biopsy proven DCIS. Pre-treatment MRI requested to evaluate extent of disease. LABS:  Creatinine was obtained on site at [facility] at [address] Results: Creatinine 0.5 mg/dL.  Estimated GFR 131. EXAM: BILATERAL BREAST MRI WITH AND WITHOUT CONTRAST TECHNIQUE: Multiplanar, multisequence MR images of both breasts were obtained prior to and following the intravenous administration of 20 ml of Multihance. THREE-DIMENSIONAL MR IMAGE RENDERING ON INDEPENDENT WORKSTATION: Three-dimensional MR images were rendered by post-processing of the original MR data on an independent workstation. The three-dimensional MR images were interpreted, and findings are reported in the following complete MRI report for this study. Three dimensional images were evaluated at the independent DynaCad workstation. COMPARISON:  No prior MRI. Mammography 09/02/2015 (left), 08/30/2015 (left), 02/15/2015 (bilateral) and earlier. FINDINGS: Breast composition: b. Scattered fibroglandular tissue. Background parenchymal enhancement: Minimal. Right breast: No mass or abnormal enhancement.  Left breast: Blooming artifact from the surgical clips placed the time of prior  lumpectomy. Non mass enhancement involving the upper outer quadrant of the left breast measuring approximately 10 x 6 x 4 cm, which includes ductal enhancement in the upper outer subareolar left breast to the level of the nipple. Lymph nodes: No abnormal appearing lymph nodes. Ancillary findings:  None. IMPRESSION: 1. Segmental non mass enhancement indicating DCIS involving the upper outer quadrant of the left breast from the nipple posteriorly. From the nipple posteriorly, the enhancement extends approximately 10 cm. 2. No MRI evidence of malignancy involving the right breast. 3. No pathologic lymphadenopathy. RECOMMENDATION: If breast conservation surgery is contemplated, MRI biopsy of the ductal enhancement in the upper outer subareolar left breast is recommended to confirm the anterior extent of the presumed DCIS. BI-RADS CATEGORY  4: Suspicious. Electronically Signed: By: Evangeline Dakin M.D. On: 09/10/2015 09:32   Mm Digital Diagnostic Unilat L  Result Date: 09/16/2015 CLINICAL DATA:  Status post MR guided core needle biopsy of the upper-outer retroareolar portion of a 10 x 6 x 4 cm area of non mass enhancement in the upper-outer quadrant of the left breast. Recent biopsy of the posterior aspect of this area demonstrated ductal carcinoma in situ. Status post left lumpectomy for a papilloma and ductal carcinoma in situ in 2014. There was a focally positive margin at that time and the patient declined radiation therapy. EXAM: DIAGNOSTIC LEFT MAMMOGRAM POST MRI BIOPSY COMPARISON:  Previous exam(s). FINDINGS: Mammographic images were obtained following MR guided biopsy of the upper-outer retroareolar portion of the recently demonstrated 10 x 6 x 4 cm area of non mass enhancement in the upper-outer quadrant of the left breast. These demonstrate an hourglass shaped biopsy marker clip at the location of the biopsy. This is located 7.7 cm anterior, inferior and medial to the recently placed ribbon shaped biopsy  marker clip. IMPRESSION: Appropriate clip deployment following left breast MR guided core needle biopsy. Final Assessment: Post Procedure Mammograms for Marker Placement Electronically Signed   By: Claudie Revering M.D.   On: 09/16/2015 09:36   Mm Digital Diagnostic Unilat L  Result Date: 09/02/2015 CLINICAL DATA:  Status post stereotactic guided core needle biopsy of a 14 mm group of microcalcifications in the upper-outer quadrant of the left breast EXAM: DIAGNOSTIC LEFT MAMMOGRAM POST STEREOTACTIC BIOPSY COMPARISON:  Previous exam(s). FINDINGS: Mammographic images were obtained following stereotactic guided biopsy of a 14 mm group of microcalcifications in the upper-outer quadrant of the left breast. These demonstrate a ribbon shaped biopsy marker clip at the location of the biopsied calcifications. There is a suggestion of some tiny calcifications remaining at that location following biopsy. IMPRESSION: Appropriate clip deployment following left breast stereotactic guided core needle biopsy. Final Assessment: Post Procedure Mammograms for Marker Placement Electronically Signed   By: Claudie Revering M.D.   On: 09/02/2015 10:35   US Breast Ltd Uni Left Inc Axilla  Result Date: 08/30/2015 CLINICAL DATA:  The patient had bloody nipple discharge in 2014. A papilloma with associated DCIS was found and excised. The patient presents with new spontaneous left nipple discharge which is at times clear and at times bloody. EXAM: 2D DIGITAL DIAGNOSTIC LEFT MAMMOGRAM WITH CAD AND ADJUNCT TOMO ULTRASOUND LEFT BREAST COMPARISON:  Previous exam(s). ACR Breast Density Category b: There are scattered areas of fibroglandular density. FINDINGS: Two masses in the medial left breast have been present since 2008. Postlumpectomy changes are identified. Within the lumpectomy changes, there is a group of  calcifications which are unchanged since at least 2012. Posterior to the lumpectomy changes, there are new pleomorphic calcifications. On  spot magnification views, there is a 2 mm central cluster with a few surrounding calcifications spanning a total of 14 mm. Mammographic images were processed with CAD. On physical exam, no suspicious lumps are identified. Targeted ultrasound is performed, showing scarring behind the nipple with no definitive intraductal mass. IMPRESSION: New calcifications posterior to the lumpectomy site, indeterminate. No other cause for nipple discharge identified. RECOMMENDATION: Recommend stereotactic biopsy of the left breast calcifications. If this biopsy does not demonstrate pathology correlating with bloody nipple discharge, recommend breast MRI. I have discussed the findings and recommendations with the patient. Results were also provided in writing at the conclusion of the visit. If applicable, a reminder letter will be sent to the patient regarding the next appointment. BI-RADS CATEGORY  4: Suspicious. Electronically Signed   By: Dorise Bullion III M.D   On: 08/30/2015 15:33   Mm Diag Breast Tomo Uni Left  Result Date: 08/30/2015 CLINICAL DATA:  The patient had bloody nipple discharge in 2014. A papilloma with associated DCIS was found and excised. The patient presents with new spontaneous left nipple discharge which is at times clear and at times bloody. EXAM: 2D DIGITAL DIAGNOSTIC LEFT MAMMOGRAM WITH CAD AND ADJUNCT TOMO ULTRASOUND LEFT BREAST COMPARISON:  Previous exam(s). ACR Breast Density Category b: There are scattered areas of fibroglandular density. FINDINGS: Two masses in the medial left breast have been present since 2008. Postlumpectomy changes are identified. Within the lumpectomy changes, there is a group of calcifications which are unchanged since at least 2012. Posterior to the lumpectomy changes, there are new pleomorphic calcifications. On spot magnification views, there is a 2 mm central cluster with a few surrounding calcifications spanning a total of 14 mm. Mammographic images were processed with  CAD. On physical exam, no suspicious lumps are identified. Targeted ultrasound is performed, showing scarring behind the nipple with no definitive intraductal mass. IMPRESSION: New calcifications posterior to the lumpectomy site, indeterminate. No other cause for nipple discharge identified. RECOMMENDATION: Recommend stereotactic biopsy of the left breast calcifications. If this biopsy does not demonstrate pathology correlating with bloody nipple discharge, recommend breast MRI. I have discussed the findings and recommendations with the patient. Results were also provided in writing at the conclusion of the visit. If applicable, a reminder letter will be sent to the patient regarding the next appointment. BI-RADS CATEGORY  4: Suspicious. Electronically Signed   By: Dorise Bullion III M.D   On: 08/30/2015 15:33   Mm Lt Breast Bx Johnella Moloney Dev 1st Lesion Image Bx Spec Stereo Guide  Addendum Date: 09/03/2015   ADDENDUM REPORT: 09/03/2015 11:45 ADDENDUM: Pathology revealed grade I ductal carcinoma in situ, stromal mucin with calcifications, focal invasion cannot be excluded, in the left breast. This was found to be concordant by Dr. Claudie Revering. Pathology results were discussed with the patient by telephone. The patient reported doing well after the biopsy. Post biopsy instructions and care were reviewed and questions were answered. The patient was encouraged to call [facility] for any additional concerns. Surgical consultation has been arranged with Dr. Nedra Hai at [facility] on September 08, 2015. A bilateral breast MRI is recommended for new left nipple discharge. Pathology results reported by Susa Raring RN, BSN on 09/03/2015. Electronically Signed   By: Claudie Revering M.D.   On: 09/03/2015 11:45   Result Date: 09/03/2015 CLINICAL DATA:  14 mm group of microcalcifications in the upper-outer quadrant of the left breast posterior to the patient's lumpectomy  site. She had a left lumpectomy for a papilloma and low-grade ductal carcinoma in situ in 2014. She had a focally positive margin at that time without re-excision. She also did not have radiation therapy, per her choice. She also has new spontaneous left nipple discharge. EXAM: LEFT BREAST STEREOTACTIC CORE NEEDLE BIOPSY COMPARISON:  Previous exams. FINDINGS: The patient and I discussed the procedure of stereotactic-guided biopsy including benefits and alternatives. We discussed the high likelihood of a successful procedure. We discussed the risks of the procedure including infection, bleeding, tissue injury, clip migration, and inadequate sampling. Informed written consent was given. The usual time out protocol was performed immediately prior to the procedure. Using sterile technique and 1% Lidocaine as local anesthetic, under stereotactic guidance, a 9 gauge vacuum assisted device was used to perform core needle biopsy of the recently demonstrated 14 mm group of microcalcifications in the upper-outer quadrant of the left breast using a cephalad approach. Specimen radiograph was performed showing multiple calcifications within multiple specimens. Specimens with calcifications are identified for pathology. At the conclusion of the procedure, a ribbon shaped tissue marker clip was deployed into the biopsy cavity. Follow-up 2-view mammogram was performed and dictated separately. IMPRESSION: Stereotactic-guided biopsy of a 14 mm group of microcalcifications in the upper-outer quadrant of the left breast. No apparent complications. Electronically Signed: By: Claudie Revering M.D. On: 09/02/2015 10:32   Mr Aundra Millet Breast Bx Johnella Moloney Dev 1st Lesion Image Bx Spec Mr Guide  Addendum Date: 09/20/2015   ADDENDUM REPORT: 09/17/2015 10:35 ADDENDUM: Pathology revealed fibroadipose tissue with fat necrosis and foreign body cell reaction in the left breast. This was found to be concordant by Dr. Abelardo Diesel. Pathology results were  discussed with the patient by telephone. The patient reported doing well after the biopsy with tenderness at the site. Post biopsy instructions and care were reviewed and questions were answered. The patient was encouraged to call [facility] for any additional concerns. There is a large area of enhancement in the left breast, and if breast conservation is desired, there should be a bracketed needle localization of the two clips to include the entire area. Treatment plan for known left breast cancer is recommended. Pathology results reported by Susa Raring RN, BSN on 09/17/2015. Electronically Signed   By: Claudie Revering M.D.   On: 09/17/2015 10:35   Result Date: 09/20/2015 CLINICAL DATA:  10 x 6 x 4 cm area of non mass enhancement in the upper-outer quadrant of the left breast extending from an area of recently biopsied ductal carcinoma in situ to the nipple. The upper-outer retroareolar portion of this was recommended for biopsy to determine anterior extent of disease. EXAM: MRI GUIDED CORE NEEDLE BIOPSY OF THE LEFT BREAST TECHNIQUE: Multiplanar, multisequence MR imaging of the left breast was performed both before and after administration of intravenous contrast. CONTRAST:  62m MULTIHANCE GADOBENATE DIMEGLUMINE 529 MG/ML IV SOLN COMPARISON:  Previous exams. FINDINGS: I met with the patient, and we discussed the procedure of MRI guided biopsy, including risks, benefits, and alternatives. Specifically, we discussed the risks of infection, bleeding, tissue injury, clip migration, and inadequate sampling. Informed, written consent was given. The usual time out protocol was performed immediately prior to the procedure. Using sterile technique, 1% Lidocaine, MRI guidance, and a 9 gauge vacuum assisted device, biopsy was performed of the upper outer retroareolar portion of the  recently demonstrated 10 x 6 x 4 cm area of non mass enhancement in the upper-outer quadrant of the left breast  using a lateral approach. At the conclusion of the procedure, a hourglass shaped tissue marker clip was deployed into the biopsy cavity. Follow-up 2-view mammogram was performed and dictated separately. IMPRESSION: MRI guided biopsy of the upper-outer retroareolar portion of a 10 x 6 x 4 mm area of non mass enhancement in the upper-outer quadrant of the left breast. No apparent complications. Electronically Signed: By: Claudie Revering M.D. On: 09/16/2015 09:32    ELIGIBLE FOR AVAILABLE RESEARCH PROTOCOL: No  ASSESSMENT: 50 y.o. 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, with a focally positive margin  (2) status post additional surgery for margin clearance 01/22/2013 showed the posterior margin negative at 0.1 cm  (3) the patient decided against adjuvant radiation; she was supposed to have started tamoxifen May 2015,  (4) left breast upper outer quadrant biopsy 09/02/2015 finds ductal carcinoma in situ, grade 1, with not enough tissue for prognostic panel  (a) upper outer quadrant biopsy retroareolar biopsy 09/16/2015, found to fibroadipose tissue and fat necrosis but no evidence of malignancy  (5) genetics testing sent 09/14/2015  (6) definitive surgery to follow, with oncoplastic reconstruction  (7) adjuvant radiation to follow  (8) consider adjuvant anastrozole  PLAN: We spent the better part of today's hour-long appointment discussing the biology of breast cancer in general, and the specifics of the patient's tumor in particular. 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 She was also not able to tolerate tamoxifen.  She is hopeful to be able to have a lumpectomy with clear margins. She would like  to go ahead and have most likely by bilateral breast reductions as a separate procedure afterwards: She is concerned that she has a reconstruction done at the same time as the lumpectomy if there are positive margins that would be a complication.  In addition she just had her genetics sent a week ago. It will be approximately 2 more weeks before she has those results. If she did carry a deleterious gene she is fairly clear that she would want bilateral mastectomies. Accordingly she does need to wait until she has the genetics result before she has her definitive surgery.  Hopefully all the surgery will show is ductal carcinoma in situ. She would then proceed to radiation which will take her through most of October.  Accordingly she will return to see me early December. She should be done with local treatment by then, including hopefully her reconstruction, and we can consider and anastrozole.  Today we discussed the possible toxicities, side effects and complications of that agent and also the fact that it is only available to postmenopausal women. We don't know what her menopausal status is that she has a Mirena IUD in place. I'm going to obtain appropriate lab work today to help [place] clarify that situation  She has a good understanding of the overall plan. She agrees with it. She knows the goal of treatment in her case is cure.  Stacey Cruel, MD   09/24/2015 3:16 PM Medical Oncology and Hematology [facility] [address] [place], [place] [address] Tel. [phone]  Fax. [phone]

## 2015-09-24 NOTE — Telephone Encounter (Signed)
Discussed that genetic testing was negative for mutations within any of 42 genes on the [facility] Common Hereditary Cancers Panel (Breast, Gyn, GI).  Additionally, no uncertain changes were found.  Perhaps of our biggest concern for a mutation, was the PTEN gene, which was also totally negative for mutations.  Discussed that this is likely a reassuring result and that Ms. Conard's cancer history is likely sporadic.  Encouraged her to continue to follow her doctors' recommendations for future cancer screening.  Recommended she call and let us know if anyone else gets diagnosed with cancer in the family.  She then let me know that her nephew was just diagnosed with a neuroblastoma at 2 1/2 years.  He is seen at [facility] and so he will be evaluated in genetics there, if deemed appropriate.  Recommended genetic testing for Ms. Burek's maternal first cousin who has a history of kidney cancer at 56-47.  Discussed that Ms. Dokken's sisters should be getting annual mammograms.  Her nieces can begin getting annual mammograms at age 32.  She is welcome to call with any questions.  I will mail her a copy of her test result.

## 2015-09-25 LAB — LUTEINIZING HORMONE: LH: 3.2 m[IU]/mL

## 2015-09-25 LAB — FOLLICLE STIMULATING HORMONE: FSH: 6.7 m[IU]/mL

## 2015-09-29 ENCOUNTER — Encounter (HOSPITAL_COMMUNITY): Payer: Self-pay | Admitting: *Deleted

## 2015-09-29 NOTE — H&P (Signed)
Stacey Roberts 09/08/2015 9:15 AM Location: [facility] Patient #: [id number] DOB: [birth year] Divorced / Language: [place] / Race: White Female   History of Present Illness (Rayman Petrosian A. Magnus Ivan MD; 09/08/2015 9:43 AM) The patient is a 50 year old female who presents with breast cancer. This is a patient I operated on in 2014 for ductal carcinoma in situ of the left breast. She had reexcision secondary to close margins. She was originally on tamoxifen but had to stop after 4 months because of symptoms. She also refused radiation therapy. Approximately 1 month ago she started having nipple discharge. Some is been clearance and has been bloody. She had a mammogram that showed new calcifications in the left breast. Stereotactic biopsy showed DCIS. Previously, it was recommended she go to genetics counseling secondary thyroid cancer but she refuses this. She is otherwise doing well and is in no change in her medical care since I saw her in 2015.   Other Problems Fay Records, CMA; 09/08/2015 9:16 AM) Breast Cancer Diabetes Mellitus High blood pressure Thyroid Cancer Thyroid Disease  Past Surgical History Fay Records, CMA; 09/08/2015 9:16 AM) Breast Biopsy Left. Breast Mass; Local Excision Left. Thyroid Surgery  Diagnostic Studies History Fay Records, [place]; 09/08/2015 9:16 AM) Colonoscopy within last year Mammogram within last year Pap Smear 1-5 years ago  Allergies Fay Records, CMA; 09/08/2015 9:17 AM) Penicillin V *PENICILLINS* Rash. Clindamycin HCl *ANTI-INFECTIVE AGENTS - MISC.*  Medication History Fay Records, CMA; 09/08/2015 9:18 AM) Lyrica (75MG  Capsule, Oral) Active. Pantoprazole Sodium (40MG  Tablet DR, Oral) Active. Aspirin (81MG  Tablet, Oral) Active. Multiple Vitamin (Oral) Active. Levothyroxine Sodium ( Tablet, Oral) Active. Olmesartan Medoxomil (20MG  Tablet, Oral) Active. Lantus SoloStar (100UNIT/ML Soln Pen-inj, Subcutaneous)  Active. MetFORMIN HCl (1000MG  Tablet, Oral) Active. Rosuvastatin Calcium (10MG  Tablet, Oral) Active. True Metrix Blood Glucose Test (In Vitro) Active. Invokana (300MG  Tablet, Oral) Active. Byetta 10 MCG Pen (10MCG/0.04ML Soln Pen-inj, Subcutaneous) Active. BD Pen Needle Nano U/F (32G X 4 MM Misc,) Active. Medications Reconciled  Social History Fay Records, [place]; 09/08/2015 9:16 AM) Alcohol use Occasional alcohol use. Caffeine use Coffee. No drug use Tobacco use Former smoker.  Family History Fay Records, [place]; 09/08/2015 9:16 AM) Alcohol Abuse Father. Diabetes Mellitus Brother, Sister. Heart Disease Mother. Heart disease in female family member before age 56 Respiratory Condition Father. Thyroid problems Mother.  Pregnancy / Birth History Fay Records, CMA; 09/08/2015 9:16 AM) Age at menarche 14 years. Contraceptive History Intrauterine device. Gravida 0 Irregular periods Para 0    Review of Systems Fay Records CMA; 09/08/2015 9:16 AM) General Not Present- Appetite Loss, Chills, Fatigue, Fever, Night Sweats, Weight Gain and Weight Loss. Skin Not Present- Change in Wart/Mole, Dryness, Hives, Jaundice, New Lesions, Non-Healing Wounds, Rash and Ulcer. HEENT Not Present- Earache, Hearing Loss, Hoarseness, Nose Bleed, Oral Ulcers, Ringing in the Ears, Seasonal Allergies, Sinus Pain, Sore Throat, Visual Disturbances, Wears glasses/contact lenses and Yellow Eyes. Respiratory Not Present- Bloody sputum, Chronic Cough, Difficulty Breathing, Snoring and Wheezing. Breast Present- Nipple Discharge. Not Present- Breast Mass, Breast Pain and Skin Changes. Cardiovascular Not Present- Chest Pain, Difficulty Breathing Lying Down, Leg Cramps, Palpitations, Rapid Heart Rate, Shortness of Breath and Swelling of Extremities. Gastrointestinal Not Present- Abdominal Pain, Bloating, Bloody Stool, Change in Bowel Habits, Chronic diarrhea, Constipation, Difficulty Swallowing,  Excessive gas, Gets full quickly at meals, Hemorrhoids, Indigestion, Nausea, Rectal Pain and Vomiting. Female Genitourinary Not Present- Frequency, Nocturia, Painful Urination, Pelvic Pain and Urgency. Musculoskeletal Not Present- Back Pain, Joint Pain, Joint Stiffness, Muscle Pain,  Muscle Weakness and Swelling of Extremities. Neurological Not Present- Decreased Memory, Fainting, Headaches, Numbness, Seizures, Tingling, Tremor, Trouble walking and Weakness. Psychiatric Not Present- Anxiety, Bipolar, Change in Sleep Pattern, Depression, Fearful and Frequent crying. Endocrine Not Present- Cold Intolerance, Excessive Hunger, Hair Changes, Heat Intolerance, Hot flashes and New Diabetes. Hematology Not Present- Blood Thinners, Easy Bruising, Excessive bleeding, Gland problems, HIV and Persistent Infections.  Vitals Fay Records CMA; 09/08/2015 9:19 AM) 09/08/2015 9:18 AM Weight: 251.6 lb Height: 64in Body Surface Area: 2.16 m Body Mass Index: 43.19 kg/m  Temp.: 98.58F(Temporal)  Pulse: 106 (Regular)  Resp.: 18 (Unlabored)  BP: 138/78 (Sitting, Left Arm, Standard)       Physical Exam (Verna Desrocher A. Magnus Ivan MD; 09/08/2015 9:44 AM) General Mental Status-Alert. General Appearance-Consistent with stated age. Hydration-Well hydrated. Voice-Normal.  Head and Neck Head-normocephalic, atraumatic with no lesions or palpable masses. Trachea-midline. Thyroid Gland Characteristics - normal size and consistency.  Eye Eyeball - Bilateral-Extraocular movements intact. Sclera/Conjunctiva - Bilateral-No scleral icterus.  Chest and Lung Exam Chest and lung exam reveals -quiet, even and easy respiratory effort with no use of accessory muscles and on auscultation, normal breath sounds, no adventitious sounds and normal vocal resonance. Inspection Chest Wall - Normal. Back - normal.  Breast Breast - Left-Symmetric and Biopsy scar, Non Tender, No Dimpling, No  Inflammation, No Lumpectomy scars, No Mastectomy scars, No Peau d' Orange. Note: There is slight dimpling at the old lumpectomy site which is subareolar. I can palpate no masses in the breast Breast - Right-Symmetric, Non Tender, No Biopsy scars, no Dimpling, No Inflammation, No Lumpectomy scars, No Mastectomy scars, No Peau d' Orange. Breast Lump-No Palpable Breast Mass.  Cardiovascular Cardiovascular examination reveals -normal heart sounds, regular rate and rhythm with no murmurs and normal pedal pulses bilaterally.  Abdomen - Did not examine.  Neurologic - Did not examine.  Musculoskeletal - Did not examine.  Lymphatic Head & Neck  General Head & Neck Lymphatics: Bilateral - Description - Normal. Axillary  General Axillary Region: Bilateral - Description - Normal. Tenderness - Non Tender. Femoral & Inguinal - Did not examine.    Assessment & Plan (Daijon Wenke A. Magnus Ivan MD; 09/08/2015 9:45 AM) BREAST NEOPLASM, TIS (DCIS), LEFT (D05.12) Impression: This appears to be a new finding. After discussion in the multidisciplinary breast commerce today, MRI is recommended to ascertain the true size of the area of the pleomorphic calcifications as well as to evaluate the rest of both her breasts as her previous DCIS did not show up on films. She will also be referred to medical and radiation oncology as well as genetics. I may need to have her see plastic surgery to consider on the plastic reduction as well. I will call her back with the results of the MRI and we will also be tenderly scheduling her for a radioactive seed localized left breast lumpectomy and sentinel lymph node biopsy  Addendum: Pt underwent MRI showing a 10 cm area of enhancement in the left breast.  Biopsy of this was consistent with reaction from previous lumpectomy.  Genetics were negative. She was referred to plastic surgery preop and is going to have an oncoplastic reduction if margins are negative at the  lumpectomy.  Will proceed with bracketed needle localized left breast lumpectomy and sentinel node biopsy.  I discussed the risks which include but are not limited to bleeding, infection, injury to surrounding structures, need for further surgery, dvt, etc.. She agrees to proceed.

## 2015-09-29 NOTE — Progress Notes (Signed)
Pt denies cardiac history, chest pain or sob. Pt is diabetic. States [name] was 8.6 on 09/23/15. States fasting blood sugar yesterday was 107. Instructed pt to take 1/2 of her regular dose of Lantus (to take 30 units) in the AM. Instructed her not to take her Invokana, Metformin, or Byetta in the AM. Instructed pt to check blood sugar in AM when she gets up and every 2 hours until she leaves for hospital. If blood sugar is 70 or below, treat with 1/2 cup of clear juice (apple or cranberry) and recheck blood sugar 15 minutes after drinking juice. If blood sugar continues to be 70 or below, call the Short Stay department and ask to speak to a nurse. Pt voiced understanding.

## 2015-09-30 ENCOUNTER — Other Ambulatory Visit: Payer: Self-pay | Admitting: Surgery

## 2015-09-30 ENCOUNTER — Encounter (HOSPITAL_COMMUNITY)
Admission: RE | Admit: 2015-09-30 | Discharge: 2015-09-30 | Disposition: A | Discharge status: Home / Self Care | Payer: 59 | Source: Ambulatory Visit | Attending: Surgery | Admitting: Surgery

## 2015-09-30 ENCOUNTER — Ambulatory Visit (HOSPITAL_COMMUNITY): Payer: 59 | Admitting: Certified Registered Nurse Anesthetist

## 2015-09-30 ENCOUNTER — Ambulatory Visit (HOSPITAL_COMMUNITY)
Admission: RE | Admit: 2015-09-30 | Discharge: 2015-09-30 | Disposition: A | Discharge status: Home / Self Care | Payer: 59 | Source: Ambulatory Visit | Attending: Surgery | Admitting: Surgery

## 2015-09-30 ENCOUNTER — Encounter (HOSPITAL_COMMUNITY)
Admission: RE | Disposition: A | Discharge status: Home / Self Care | Payer: Self-pay | Source: Ambulatory Visit | Attending: Surgery

## 2015-09-30 ENCOUNTER — Ambulatory Visit
Admission: RE | Admit: 2015-09-30 | Discharge: 2015-09-30 | Disposition: A | Discharge status: Home / Self Care | Payer: 59 | Source: Ambulatory Visit | Attending: Surgery | Admitting: Surgery

## 2015-09-30 ENCOUNTER — Encounter (HOSPITAL_COMMUNITY): Payer: Self-pay | Admitting: Certified Registered Nurse Anesthetist

## 2015-09-30 DIAGNOSIS — Z7984 Long term (current) use of oral hypoglycemic drugs: Secondary | ICD-10-CM | POA: Diagnosis not present

## 2015-09-30 DIAGNOSIS — E119 Type 2 diabetes mellitus without complications: Secondary | ICD-10-CM | POA: Diagnosis not present

## 2015-09-30 DIAGNOSIS — D0512 Intraductal carcinoma in situ of left breast: Secondary | ICD-10-CM

## 2015-09-30 DIAGNOSIS — Z6841 Body Mass Index (BMI) 40.0 and over, adult: Secondary | ICD-10-CM | POA: Diagnosis not present

## 2015-09-30 DIAGNOSIS — K219 Gastro-esophageal reflux disease without esophagitis: Secondary | ICD-10-CM | POA: Diagnosis not present

## 2015-09-30 DIAGNOSIS — Z79899 Other long term (current) drug therapy: Secondary | ICD-10-CM | POA: Diagnosis not present

## 2015-09-30 DIAGNOSIS — C50912 Malignant neoplasm of unspecified site of left female breast: Secondary | ICD-10-CM | POA: Diagnosis not present

## 2015-09-30 DIAGNOSIS — I10 Essential (primary) hypertension: Secondary | ICD-10-CM | POA: Diagnosis not present

## 2015-09-30 DIAGNOSIS — R928 Other abnormal and inconclusive findings on diagnostic imaging of breast: Secondary | ICD-10-CM | POA: Diagnosis not present

## 2015-09-30 DIAGNOSIS — Z87891 Personal history of nicotine dependence: Secondary | ICD-10-CM | POA: Insufficient documentation

## 2015-09-30 HISTORY — PX: BREAST LUMPECTOMY WITH NEEDLE LOCALIZATION AND AXILLARY SENTINEL LYMPH NODE BX: SHX5760

## 2015-09-30 HISTORY — DX: Pneumonia, unspecified organism: J18.9

## 2015-09-30 HISTORY — DX: Headache: R51

## 2015-09-30 HISTORY — DX: Headache, unspecified: R51.9

## 2015-09-30 LAB — CBC
HCT: 42.9 % (ref 36.0–46.0)
Hemoglobin: 13.8 g/dL (ref 12.0–15.0)
MCH: 27.3 pg (ref 26.0–34.0)
MCHC: 32.2 g/dL (ref 30.0–36.0)
MCV: 85 fL (ref 78.0–100.0)
PLATELETS: 288 10*3/uL (ref 150–400)
RBC: 5.05 MIL/uL (ref 3.87–5.11)
RDW: 14.6 % (ref 11.5–15.5)
WBC: 9.4 10*3/uL (ref 4.0–10.5)

## 2015-09-30 LAB — GLUCOSE, CAPILLARY
GLUCOSE-CAPILLARY: 193 mg/dL — AB (ref 65–99)
Glucose-Capillary: 198 mg/dL — ABNORMAL HIGH (ref 65–99)

## 2015-09-30 LAB — BASIC METABOLIC PANEL
Anion gap: 9 (ref 5–15)
BUN: 11 mg/dL (ref 6–20)
CALCIUM: 9.1 mg/dL (ref 8.9–10.3)
CO2: 24 mmol/L (ref 22–32)
CREATININE: 0.48 mg/dL (ref 0.44–1.00)
Chloride: 105 mmol/L (ref 101–111)
Glucose, Bld: 190 mg/dL — ABNORMAL HIGH (ref 65–99)
Potassium: 4.2 mmol/L (ref 3.5–5.1)
SODIUM: 138 mmol/L (ref 135–145)

## 2015-09-30 LAB — HCG, SERUM, QUALITATIVE: PREG SERUM: NEGATIVE

## 2015-09-30 SURGERY — BREAST LUMPECTOMY WITH NEEDLE LOCALIZATION AND AXILLARY SENTINEL LYMPH NODE BX
Anesthesia: Regional | Site: Breast | Laterality: Left

## 2015-09-30 MED ORDER — KETOROLAC TROMETHAMINE 30 MG/ML IJ SOLN
INTRAMUSCULAR | Status: AC
  Filled 2015-09-30: qty 1

## 2015-09-30 MED ORDER — FENTANYL CITRATE (PF) 100 MCG/2ML IJ SOLN
INTRAMUSCULAR | Status: AC
  Filled 2015-09-30: qty 2

## 2015-09-30 MED ORDER — PHENYLEPHRINE HCL 10 MG/ML IJ SOLN
INTRAMUSCULAR | Status: DC | PRN
  Administered 2015-09-30: 80 ug via INTRAVENOUS
  Administered 2015-09-30: 40 ug via INTRAVENOUS
  Administered 2015-09-30 (×3): 80 ug via INTRAVENOUS
  Administered 2015-09-30: 40 ug via INTRAVENOUS

## 2015-09-30 MED ORDER — CIPROFLOXACIN IN D5W 400 MG/200ML IV SOLN
400.0000 mg | INTRAVENOUS | Status: AC
  Administered 2015-09-30: 400 mg via INTRAVENOUS
  Filled 2015-09-30: qty 200

## 2015-09-30 MED ORDER — SODIUM CHLORIDE 0.9% FLUSH: 3.0000 mL | INTRAVENOUS | Status: DC | PRN

## 2015-09-30 MED ORDER — LIDOCAINE 2% (20 MG/ML) 5 ML SYRINGE
INTRAMUSCULAR | Status: AC
  Filled 2015-09-30: qty 5

## 2015-09-30 MED ORDER — BUPIVACAINE-EPINEPHRINE (PF) 0.25% -1:200000 IJ SOLN
INTRAMUSCULAR | Status: AC
  Filled 2015-09-30: qty 30

## 2015-09-30 MED ORDER — ACETAMINOPHEN 325 MG PO TABS: 650.0000 mg | ORAL_TABLET | ORAL | Status: DC | PRN

## 2015-09-30 MED ORDER — TECHNETIUM TC 99M SULFUR COLLOID FILTERED
1.0000 | Freq: Once | INTRAVENOUS | Status: AC | PRN
  Administered 2015-09-30: 1 via INTRADERMAL

## 2015-09-30 MED ORDER — LACTATED RINGERS IV SOLN
INTRAVENOUS | Status: DC
  Administered 2015-09-30: 10:00:00 via INTRAVENOUS

## 2015-09-30 MED ORDER — BUPIVACAINE-EPINEPHRINE 0.25% -1:200000 IJ SOLN
INTRAMUSCULAR | Status: DC | PRN
  Administered 2015-09-30: 20 mL

## 2015-09-30 MED ORDER — KETOROLAC TROMETHAMINE 30 MG/ML IJ SOLN
INTRAMUSCULAR | Status: DC | PRN
  Administered 2015-09-30: 30 mg via INTRAVENOUS

## 2015-09-30 MED ORDER — PHENYLEPHRINE 40 MCG/ML (10ML) SYRINGE FOR IV PUSH (FOR BLOOD PRESSURE SUPPORT)
PREFILLED_SYRINGE | INTRAVENOUS | Status: AC
  Filled 2015-09-30: qty 10

## 2015-09-30 MED ORDER — ROCURONIUM BROMIDE 10 MG/ML (PF) SYRINGE
PREFILLED_SYRINGE | INTRAVENOUS | Status: AC
  Filled 2015-09-30: qty 10

## 2015-09-30 MED ORDER — OXYCODONE HCL 5 MG PO TABS
ORAL_TABLET | ORAL | Status: DC
Start: 2015-09-30 — End: 2015-09-30
  Filled 2015-09-30: qty 2

## 2015-09-30 MED ORDER — ONDANSETRON HCL 4 MG/2ML IJ SOLN
INTRAMUSCULAR | Status: AC
  Filled 2015-09-30: qty 2

## 2015-09-30 MED ORDER — SODIUM CHLORIDE 0.9 % IJ SOLN
INTRAMUSCULAR | Status: AC
  Filled 2015-09-30: qty 10

## 2015-09-30 MED ORDER — LIDOCAINE HCL (CARDIAC) 20 MG/ML IV SOLN
INTRAVENOUS | Status: DC | PRN
  Administered 2015-09-30: 100 mg via INTRAVENOUS

## 2015-09-30 MED ORDER — OXYCODONE-ACETAMINOPHEN 5-325 MG PO TABS: 1.0000 | ORAL_TABLET | ORAL | 0 refills | Status: DC | PRN

## 2015-09-30 MED ORDER — SODIUM CHLORIDE 0.9% FLUSH: 3.0000 mL | Freq: Two times a day (BID) | INTRAVENOUS | Status: DC

## 2015-09-30 MED ORDER — MIDAZOLAM HCL 2 MG/2ML IJ SOLN
INTRAMUSCULAR | Status: AC
  Filled 2015-09-30: qty 2

## 2015-09-30 MED ORDER — MIDAZOLAM HCL 2 MG/2ML IJ SOLN
2.0000 mg | Freq: Once | INTRAMUSCULAR | Status: AC
  Administered 2015-09-30: 2 mg via INTRAVENOUS

## 2015-09-30 MED ORDER — PHENYLEPHRINE HCL 10 MG/ML IJ SOLN
INTRAVENOUS | Status: DC | PRN
  Administered 2015-09-30: 20 ug/min via INTRAVENOUS

## 2015-09-30 MED ORDER — FENTANYL CITRATE (PF) 100 MCG/2ML IJ SOLN
INTRAMUSCULAR | Status: DC | PRN
  Administered 2015-09-30 (×6): 25 ug via INTRAVENOUS

## 2015-09-30 MED ORDER — FENTANYL CITRATE (PF) 100 MCG/2ML IJ SOLN
100.0000 ug | Freq: Once | INTRAMUSCULAR | Status: AC
  Administered 2015-09-30: 100 ug via INTRAVENOUS

## 2015-09-30 MED ORDER — ACETAMINOPHEN 650 MG RE SUPP: 650.0000 mg | RECTAL | Status: DC | PRN

## 2015-09-30 MED ORDER — PROPOFOL 10 MG/ML IV BOLUS
INTRAVENOUS | Status: AC
  Filled 2015-09-30: qty 20

## 2015-09-30 MED ORDER — SODIUM CHLORIDE 0.9 % IV SOLN: 250.0000 mL | INTRAVENOUS | Status: DC | PRN

## 2015-09-30 MED ORDER — PROMETHAZINE HCL 25 MG/ML IJ SOLN: 6.2500 mg | INTRAMUSCULAR | Status: DC | PRN

## 2015-09-30 MED ORDER — CHLORHEXIDINE GLUCONATE CLOTH 2 % EX PADS: 6.0000 | MEDICATED_PAD | Freq: Once | CUTANEOUS | Status: DC

## 2015-09-30 MED ORDER — HYDROMORPHONE HCL 1 MG/ML IJ SOLN
INTRAMUSCULAR | Status: DC
Start: 2015-09-30 — End: 2015-09-30
  Filled 2015-09-30: qty 1

## 2015-09-30 MED ORDER — MORPHINE SULFATE (PF) 2 MG/ML IV SOLN: 1.0000 mg | INTRAVENOUS | Status: DC | PRN

## 2015-09-30 MED ORDER — PROPOFOL 10 MG/ML IV BOLUS
INTRAVENOUS | Status: DC | PRN
  Administered 2015-09-30: 200 mg via INTRAVENOUS

## 2015-09-30 MED ORDER — MEPERIDINE HCL 25 MG/ML IJ SOLN: 6.2500 mg | INTRAMUSCULAR | Status: DC | PRN

## 2015-09-30 MED ORDER — HYDROMORPHONE HCL 1 MG/ML IJ SOLN
0.2500 mg | INTRAMUSCULAR | Status: DC | PRN
Start: 2015-09-30 — End: 2015-09-30
  Administered 2015-09-30 (×2): 0.5 mg via INTRAVENOUS

## 2015-09-30 MED ORDER — MIDAZOLAM HCL 5 MG/5ML IJ SOLN
INTRAMUSCULAR | Status: DC | PRN
  Administered 2015-09-30 (×2): 1 mg via INTRAVENOUS

## 2015-09-30 MED ORDER — FENTANYL CITRATE (PF) 100 MCG/2ML IJ SOLN
INTRAMUSCULAR | Status: DC
Start: 2015-09-30 — End: 2015-09-30
  Filled 2015-09-30: qty 2

## 2015-09-30 MED ORDER — ONDANSETRON HCL 4 MG/2ML IJ SOLN
INTRAMUSCULAR | Status: DC | PRN
  Administered 2015-09-30: 4 mg via INTRAVENOUS

## 2015-09-30 MED ORDER — LACTATED RINGERS IV SOLN
INTRAVENOUS | Status: DC | PRN
  Administered 2015-09-30 (×2): via INTRAVENOUS

## 2015-09-30 MED ORDER — OXYCODONE HCL 5 MG PO TABS
5.0000 mg | ORAL_TABLET | ORAL | Status: DC | PRN
  Administered 2015-09-30: 10 mg via ORAL

## 2015-09-30 MED ORDER — METHYLENE BLUE 0.5 % INJ SOLN
INTRAVENOUS | Status: AC
  Filled 2015-09-30: qty 10

## 2015-09-30 SURGICAL SUPPLY — 50 items
APPLIER CLIP 9.375 MED OPEN (MISCELLANEOUS) ×2
APR CLP MED 9.3 20 MLT OPN (MISCELLANEOUS) ×1
BINDER BREAST LRG (GAUZE/BANDAGES/DRESSINGS) IMPLANT
BINDER BREAST XLRG (GAUZE/BANDAGES/DRESSINGS) ×2 IMPLANT
BLADE 15 SAFETY STRL DISP (BLADE) ×2 IMPLANT
CANISTER SUCTION 2500CC (MISCELLANEOUS) ×2 IMPLANT
CHLORAPREP W/TINT 26ML (MISCELLANEOUS) ×2 IMPLANT
CLIP APPLIE 9.375 MED OPEN (MISCELLANEOUS) ×1 IMPLANT
CONT SPEC 4OZ CLIKSEAL STRL BL (MISCELLANEOUS) ×2 IMPLANT
COVER PROBE W GEL 5X96 (DRAPES) ×2 IMPLANT
COVER SURGICAL LIGHT HANDLE (MISCELLANEOUS) ×2 IMPLANT
DEVICE DUBIN SPECIMEN MAMMOGRA (MISCELLANEOUS) IMPLANT
DRAPE LAPAROSCOPIC ABDOMINAL (DRAPES) ×2 IMPLANT
DRAPE UTILITY XL STRL (DRAPES) ×4 IMPLANT
DRSG TEGADERM 4X4.75 (GAUZE/BANDAGES/DRESSINGS) ×2 IMPLANT
ELECT CAUTERY BLADE 6.4 (BLADE) ×2 IMPLANT
ELECT REM PT RETURN 9FT ADLT (ELECTROSURGICAL) ×2
ELECTRODE REM PT RTRN 9FT ADLT (ELECTROSURGICAL) ×1 IMPLANT
GAUZE SPONGE 4X4 12PLY STRL (GAUZE/BANDAGES/DRESSINGS) ×2 IMPLANT
GLOVE BIOGEL PI IND STRL 7.0 (GLOVE) ×1 IMPLANT
GLOVE BIOGEL PI INDICATOR 7.0 (GLOVE) ×1
GLOVE SKINSENSE NS SZ7.0 (GLOVE) ×1
GLOVE SKINSENSE STRL SZ7.0 (GLOVE) ×1 IMPLANT
GLOVE SURG SIGNA 7.5 PF LTX (GLOVE) ×4 IMPLANT
GOWN STRL REUS W/ TWL LRG LVL3 (GOWN DISPOSABLE) ×1 IMPLANT
GOWN STRL REUS W/ TWL XL LVL3 (GOWN DISPOSABLE) ×1 IMPLANT
GOWN STRL REUS W/TWL LRG LVL3 (GOWN DISPOSABLE) ×1
GOWN STRL REUS W/TWL XL LVL3 (GOWN DISPOSABLE) ×2
KIT BASIN OR (CUSTOM PROCEDURE TRAY) ×2 IMPLANT
KIT MARKER MARGIN INK (KITS) ×2 IMPLANT
KIT ROOM TURNOVER OR (KITS) ×2 IMPLANT
LIQUID BAND (GAUZE/BANDAGES/DRESSINGS) ×2 IMPLANT
NEEDLE 18GX1X1/2 (RX/OR ONLY) (NEEDLE) ×2 IMPLANT
NEEDLE FILTER BLUNT 18X 1/2SAF (NEEDLE)
NEEDLE FILTER BLUNT 18X1 1/2 (NEEDLE) IMPLANT
NEEDLE HYPO 25GX1X1/2 BEV (NEEDLE) ×4 IMPLANT
NS IRRIG 1000ML POUR BTL (IV SOLUTION) ×2 IMPLANT
PACK SURGICAL SETUP 50X90 (CUSTOM PROCEDURE TRAY) ×2 IMPLANT
PAD ARMBOARD 7.5X6 YLW CONV (MISCELLANEOUS) ×2 IMPLANT
PENCIL BUTTON HOLSTER BLD 10FT (ELECTRODE) ×2 IMPLANT
SPONGE LAP 4X18 X RAY DECT (DISPOSABLE) ×4 IMPLANT
SUT MON AB 4-0 PC3 18 (SUTURE) ×2 IMPLANT
SUT VIC AB 3-0 SH 27 (SUTURE) ×2
SUT VIC AB 3-0 SH 27XBRD (SUTURE) ×1 IMPLANT
SYR BULB 3OZ (MISCELLANEOUS) ×2 IMPLANT
SYR CONTROL 10ML LL (SYRINGE) ×4 IMPLANT
TOWEL OR 17X24 6PK STRL BLUE (TOWEL DISPOSABLE) ×2 IMPLANT
TOWEL OR 17X26 10 PK STRL BLUE (TOWEL DISPOSABLE) ×2 IMPLANT
TUBE CONNECTING 12X1/4 (SUCTIONS) ×2 IMPLANT
YANKAUER SUCT BULB TIP NO VENT (SUCTIONS) ×2 IMPLANT

## 2015-09-30 NOTE — Op Note (Signed)
Left BREAST LUMPECTOMY WITH NEEDLE LOCALIZATION AND AXILLARY SENTINEL LYMPH NODE BX  Procedure Note  KATHILEEN BERNINGER 09/30/2015   Pre-op Diagnosis: LEFT BREAST DCIS     Post-op Diagnosis: same  Procedure(s): Left BREAST LUMPECTOMY WITH NEEDLE LOCALIZATION AND AXILLARY SENTINEL LYMPH NODE BX  Surgeon(s): Coralie Keens, MD  Anesthesia: General  Staff:  Circulator: Jamas Lav, RN; Kennieth Francois, RN; Candie Mile, RN Relief Circulator: Esau Grew, RN Scrub Person: Jenelle Mages Panchit  Estimated Blood Loss: Minimal               Specimens: sent to path          [facility]   Date: 09/30/2015  Time: 12:15 PM

## 2015-09-30 NOTE — Anesthesia Procedure Notes (Signed)
Procedure Name: LMA Insertion Date/Time: 09/30/2015 11:14 AM Performed by: Garrison Columbus T Pre-anesthesia Checklist: Patient identified, Emergency Drugs available, Suction available and Patient being monitored Patient Re-evaluated:Patient Re-evaluated prior to inductionOxygen Delivery Method: Circle System Utilized Preoxygenation: Pre-oxygenation with 100% oxygen Intubation Type: IV induction LMA: LMA inserted LMA Size: 4.0 Number of attempts: 1 Airway Equipment and Method: Bite block Placement Confirmation: positive ETCO2 and breath sounds checked- equal and bilateral Tube secured with: Tape Dental Injury: Teeth and Oropharynx as per pre-operative assessment

## 2015-09-30 NOTE — Transfer of Care (Signed)
Immediate Anesthesia Transfer of Care Note  Patient: Roosvelt Maser  Procedure(s) Performed: Procedure(s): Left BREAST LUMPECTOMY WITH NEEDLE LOCALIZATION AND AXILLARY SENTINEL LYMPH NODE BX (Left)  Patient Location: PACU  Anesthesia Type:General and Regional  Level of Consciousness: awake, alert  and oriented  Airway & Oxygen Therapy: Patient Spontanous Breathing and Patient connected to nasal cannula oxygen  Post-op Assessment: Report given to RN, Post -op Vital signs reviewed and stable and Patient moving all extremities X 4  Post vital signs: Reviewed and stable  Last Vitals:  Vitals:   09/30/15 1055 09/30/15 1221  BP:  (!) (P) 142/83  Pulse: (!) 101 (!) (P) 106  Resp: 17 (P) 16  Temp:  (P) 36.6 C    Last Pain:  Vitals:   09/30/15 1001  TempSrc: Oral      Patients Stated Pain Goal: 4 ([id number])  Complications: No apparent anesthesia complications

## 2015-09-30 NOTE — Anesthesia Preprocedure Evaluation (Addendum)
Anesthesia Evaluation  Patient identified by MRN, date of birth, ID band Patient awake    Reviewed: Allergy & Precautions, H&P , NPO status , Patient's Chart, lab work & pertinent test results  Airway Mallampati: I  TM Distance: >3 FB Neck ROM: Full    Dental  (+) Teeth Intact, Dental Advisory Given   Pulmonary sleep apnea , pneumonia, former smoker,    breath sounds clear to auscultation       Cardiovascular hypertension, Pt. on medications  Rhythm:Regular     Neuro/Psych  Headaches, PSYCHIATRIC DISORDERS    GI/Hepatic GERD  ,  Endo/Other  diabetes, Well Controlled, Type 2, Oral Hypoglycemic AgentsHypothyroidism Morbid obesity  Renal/GU      Musculoskeletal   Abdominal   Peds  Hematology   Anesthesia Other Findings   Reproductive/Obstetrics                            Anesthesia Physical  Anesthesia Plan  ASA: III  Anesthesia Plan: General and Regional   Post-op Pain Management: GA combined w/ Regional for post-op pain   Induction: Intravenous  Airway Management Planned: LMA  Additional Equipment:   Intra-op Plan:   Post-operative Plan: Extubation in OR  Informed Consent: I have reviewed the patients History and Physical, chart, labs and discussed the procedure including the risks, benefits and alternatives for the proposed anesthesia with the patient or authorized representative who has indicated his/her understanding and acceptance.   Dental advisory given  Plan Discussed with: CRNA, Anesthesiologist and Surgeon  Anesthesia Plan Comments:         Anesthesia Quick Evaluation

## 2015-09-30 NOTE — Discharge Instructions (Signed)
[facility] °Office Phone Number [phone] ° °BREAST BIOPSY/ PARTIAL MASTECTOMY: POST OP INSTRUCTIONS ° °Always review your discharge instruction sheet given to you by the facility where your surgery was performed. ° °IF YOU HAVE DISABILITY OR FAMILY LEAVE FORMS, YOU MUST BRING THEM TO THE OFFICE FOR PROCESSING.  DO NOT GIVE THEM TO YOUR DOCTOR. ° °1. A prescription for pain medication may be given to you upon discharge.  Take your pain medication as prescribed, if needed.  If narcotic pain medicine is not needed, then you may take acetaminophen (Tylenol) or ibuprofen (Advil) as needed. °2. Take your usually prescribed medications unless otherwise directed °3. If you need a refill on your pain medication, please contact your pharmacy.  They will contact our office to request authorization.  Prescriptions will not be filled after 5pm or on week-ends. °4. You should eat very light the first 24 hours after surgery, such as soup, crackers, pudding, etc.  Resume your normal diet the day after surgery. °5. Most patients will experience some swelling and bruising in the breast.  Ice packs and a good support bra will help.  Swelling and bruising can take several days to resolve.  °6. It is common to experience some constipation if taking pain medication after surgery.  Increasing fluid intake and taking a stool softener will usually help or prevent this problem from occurring.  A mild laxative (Milk of Magnesia or Miralax) should be taken according to package directions if there are no bowel movements after 48 hours. °7. Unless discharge instructions indicate otherwise, you may remove your bandages 24-48 hours after surgery, and you may shower at that time.  You may have steri-strips (small skin tapes) in place directly over the incision.  These strips should be left on the skin for 7-10 days.  If your surgeon used skin glue on the incision, you may shower in 24 hours.  The glue will flake off over the  next 2-3 weeks.  Any sutures or staples will be removed at the office during your follow-up visit. °8. ACTIVITIES:  You may resume regular daily activities (gradually increasing) beginning the next day.  Wearing a good support bra or sports bra minimizes pain and swelling.  You may have sexual intercourse when it is comfortable. °a. You may drive when you no longer are taking prescription pain medication, you can comfortably wear a seatbelt, and you can safely maneuver your car and apply brakes. °b. RETURN TO WORK:  ______________________________________________________________________________________ °9. You should see your doctor in the office for a follow-up appointment approximately two weeks after your surgery.  Your doctor’s nurse will typically make your follow-up appointment when she calls you with your pathology report.  Expect your pathology report 2-3 business days after your surgery.  You may call to check if you do not hear from us after three days. °10. OTHER INSTRUCTIONS: _______________________________________________________________________________________________ _____________________________________________________________________________________________________________________________________ °_____________________________________________________________________________________________________________________________________ °_____________________________________________________________________________________________________________________________________ ° °WHEN TO CALL YOUR DOCTOR: °1. Fever over 101.0 °2. Nausea and/or vomiting. °3. Extreme swelling or bruising. °4. Continued bleeding from incision. °5. Increased pain, redness, or drainage from the incision. ° °The clinic staff is available to answer your questions during regular business hours.  Please don’t hesitate to call and ask to speak to one of the nurses for clinical concerns.  If you have a medical emergency, go to the nearest  emergency room or call 911.  A surgeon from [facility] is always on call at the hospital. ° °For further questions, please visit [email or web address]  °

## 2015-09-30 NOTE — Anesthesia Postprocedure Evaluation (Signed)
Anesthesia Post Note  Patient: Stacey Roberts  Procedure(s) Performed: Procedure(s) (LRB): Left BREAST LUMPECTOMY WITH NEEDLE LOCALIZATION AND AXILLARY SENTINEL LYMPH NODE BX (Left)  Patient location during evaluation: PACU Anesthesia Type: General and Regional Level of consciousness: sedated and patient cooperative Pain management: pain level controlled Vital Signs Assessment: post-procedure vital signs reviewed and stable Respiratory status: spontaneous breathing Cardiovascular status: stable Anesthetic complications: no    Last Vitals:  Vitals:   09/30/15 1304 09/30/15 1310  BP: (!) 94/58 124/79  Pulse: 87 90  Resp: 11   Temp: 36.7 C     Last Pain:  Vitals:   09/30/15 1310  TempSrc:   PainSc: 2                  Nolon Nations

## 2015-09-30 NOTE — Interval H&P Note (Signed)
History and Physical Interval Note: no change in H and P  09/30/2015 10:52 AM  Stacey Roberts  has presented today for surgery, with the diagnosis of LEFT BREAST DCIS  The various methods of treatment have been discussed with the patient and family. After consideration of risks, benefits and other options for treatment, the patient has consented to  Procedure(s): BREAST LUMPECTOMY WITH NEEDLE LOCALIZATION AND AXILLARY SENTINEL LYMPH NODE BX (Left) as a surgical intervention .  The patient's history has been reviewed, patient examined, no change in status, stable for surgery.  I have reviewed the patient's chart and labs.  Questions were answered to the patient's satisfaction.     [name]

## 2015-10-01 ENCOUNTER — Ambulatory Visit: Payer: 59 | Admitting: Pharmacist

## 2015-10-01 ENCOUNTER — Encounter (HOSPITAL_COMMUNITY): Payer: Self-pay | Admitting: Surgery

## 2015-10-01 NOTE — Op Note (Signed)
Stacey Roberts, Stacey Roberts NO.:  [id number]  MEDICAL RECORD NO.:  [id number]  LOCATION:                                 FACILITY:  PHYSICIAN:  Coralie Keens, M.D. DATE OF BIRTH:  [birth year]  DATE OF PROCEDURE:  09/30/2015 DATE OF DISCHARGE:                              OPERATIVE REPORT   PREOPERATIVE DIAGNOSIS:  Left breast carcinoma in situ.  POSTOPERATIVE DIAGNOSIS:  Left breast carcinoma in situ.  PROCEDURE:  Needle localized left breast partial mastectomy and left axillary sentinel lymph node biopsy.  SURGEON:  Coralie Keens, M.D.  ANESTHESIA:  General and 0.5% Marcaine.  ESTIMATED BLOOD LOSS:  Minimal.  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  She decided not to have radiation therapy and stop taking antihormonal therapy.  Mammography showed a suspicious area in the left breast and biopsy was performed showing ductal carcinoma in situ.  An MRI was then performed showing a 10-cm area of enhancement.  The most distal extent of this was biopsied and found to be consistent with previous scarring from her other surgery.  At this point, after a long discussion, decision was made to proceed with a bracketed partial mastectomy and sentinel lymph node biopsy and then oncoplastic reduction at a later date.  FINDINGS:  The patient was found to have the previously placed clips and wires all in the large lumpectomy specimen.  One sentinel lymph node was identified in the axilla and excised and sent to Pathology for evaluation.  PROCEDURE IN DETAIL:  The patient presented to the [facility] and had 3 localization wires placed in the left breast.  She was then taken to the preoperative holding area.  Radioactive isotope was then injected into the areola of her left breast.  She was then taken to the operating room.  She was placed supine on the operating table, and general anesthesia  was induced.  Her left breast and axilla were then prepped and draped in usual sterile fashion.  She had been previously marked by the plastic surgeons with ink on her skin.  I used the lateral aspect mark to make a longitudinal incision in the breast.  I then took this down to the breast tissue with electrocautery.  This incision was between 3 wires.  I then dissected going medially underneath the areola and then down to the chest wall staying around the inferior 2 wires.  I then took the incision more superiorly going to the most superior wire and brought this into the incision as well.  I then completed the large partial mastectomy taking a large amount of tissue in the upper outer quadrant of the breast going down to the chest wall.  Once the entire partial mastectomy specimen was removed, I marked it with marker paint. X-ray was performed and confirmed that all previous biopsy markers were removed.  At this point, I evaluated the axilla through this large incision and identified the area of increased uptake in the axilla.  I dissected down into the axillary tissue with electrocautery.  I then identified 1 enlarged lymph node, which I grasped with an Donne Hazel  clamp and excised in its entirety with the electrocautery.  It was then sent to Pathology for evaluation.  I examined the axilla further and found no other increased uptake of radioactive isotope.  There were also no enlarged lymph nodes.  At this point, hemostasis appeared to be achieved with the cautery.  I anesthetized the wound incision further with Marcaine.  I irrigated with saline.  I then closed the subcutaneous tissue with interrupted 3-0 Vicryl sutures and closed the skin with a running 4-0 Monocryl.  Skin glue was then applied.  The patient tolerated the procedure well.  All sponge, needle, and instrument counts were correct at the end of procedure.  The patient was then taken in a stable condition from the operating room  to recovery room.     Coralie Keens, M.D.   ______________________________ Coralie Keens, M.D.    DB/MEDQ  D:  09/30/2015  T:  10/01/2015  Job:  [id number]

## 2015-10-04 ENCOUNTER — Other Ambulatory Visit: Payer: Self-pay | Admitting: Oncology

## 2015-10-04 LAB — ESTRADIOL, ULTRA SENS: Estradiol, Sensitive: 11.1 pg/mL

## 2015-10-04 NOTE — Progress Notes (Addendum)
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    09/30/15 Diagnosis 1. Breast, lumpectomy, Left - INVASIVE DUCTAL CARCINOMA WITH CALCIFICATIONS, GRADE I/III, SPANNING 2.3 CM. - DUCTAL CARCINOMA IN SITU WITH CALCIFICATIONS, LOW GRADE. - LOBULAR NEOPLASIA (ATYPICAL LOBULAR HYPERPLASIA). - DUCTAL CARCINOMA IN SITU IS FOCALLY PRESENT AT THE POSTERIOR MARGIN AND BROADLY LESS THAN 0.1 CM TO THE SAME POSTERIOR MARGIN; DUCTAL CARCINOMA IN SITU IS BROADLY LESS THAN 0.1 CM TO THE LATERAL MARGIN. - INVASIVE CARCINOMA IS BROADLY LESS THAN 0.1 CM TO THE POSTERIOR MARGIN. - SEE ONCOLOGY TABLE BELOW. 2. Lymph node, sentinel, biopsy, Left - THERE IS NO EVIDENCE OF CARCINOMA IN 1 OF 1 LYMPH NODE (0/1). Breast, left, needle core biopsy, UOQ retroareolar - FIBROADIPOSE TISSUE WITH FAT NECROSIS AND FOREIGN BODY GIANT CELL REACTION. - THERE IS NO EVIDENCE OF MALIGNANCY. - SEE COMMENT.  09/02/15 Diagnosis Breast, left, needle core biopsy, UOQ DUCTAL CARCINOMA IN SITU, GRADE 1 FOCAL INVASION CAN NOT BE EXCLUDED STROMAL MUCIN WITH CALCIFICATION  Histology per Pathology Report 01/06/13 Diagnosis Breast, lumpectomy, Left - LOW GRADE DUCTAL CARCINOMA IN SITU INVOLVING AN INTRADUCTAL PAPILLOMA. - INKED SURGICAL MARGIN IS FOCALLY POSITIVE. - SEE ONCOLOGY TEMPLATE. 1 of 01/22/13 Diagnosis 1. Breast, biopsy, Left - FIBROCYSTIC CHANGES WITH FOCAL INFLAMMATION AND FIBROSIS. - NO EVIDENCE OF MALIGNANCY. 2. Breast, excision, Left posterior margin - FOCAL DUCTAL CARCINOMA IN SITU, FOCALLY 0.1 CM FROM THE MARGIN.  Receptor Status: ER(60%), PR (80%), Ki67 (3%)  Did patient present with symptoms (if so, please note symptoms) or was this found on screening mammography?: patient concerned of spontaneous left bloody nipple discharge MM on 12/24/12 revealed no evidence of  malignancy. Ductogram performed on left breast on 12/24/12.  Past/Anticipated interventions by surgeon, if any: Left breast lumpectomy with low grade ductal carcinoma in situ involving an intraductal papilloma on 01/06/2013. 09/30/15 - Procedure: Left BREAST LUMPECTOMY WITH NEEDLE LOCALIZATION AND AXILLARY SENTINEL LYMPH NODE BX;  Surgeon: Coralie Keens, MD  Past/Anticipated interventions by medical oncology, if any: The patient refused adjuvant radiation and did not tolerate tamoxifen, which she took for approximately 2 months.   Lymphedema issues, if any: No  Pain issues, if any: yes has pain in her left breast at a 4/10.  She takes percocet at night.  SAFETY ISSUES:  Prior radiation? No  Pacemaker/ICD? No  Possible current pregnancy?No, Mirena  IUD in place  Is the patient on methotrexate?No  Current Complaints / other details;Patient here with significant other.  She is a Nurse, adult at [employer].  She will have another surgery with Dr. Rush Farmer and Dr. Maudie Flakes on 10/12/15.  BP 125/70 (BP Location: Right Arm, Patient Position: Sitting)   Pulse 72   Temp 98.3 F (36.8 C) (Oral)   Ht _0  (1.626 m)   Wt 256 lb 4.8 oz (116.3 kg)   SpO2 99%   BMI 43.99 kg/m    Wt Readings from Last 3 Encounters:  10/06/15 256 lb 4.8 oz (116.3 kg)  09/30/15 252 lb 4.8 oz (114.4 kg)  09/24/15 252 lb 4.8 oz (114.4 kg)

## 2015-10-06 ENCOUNTER — Encounter: Payer: Self-pay | Admitting: Radiation Oncology

## 2015-10-06 ENCOUNTER — Ambulatory Visit
Admission: RE | Admit: 2015-10-06 | Discharge: 2015-10-06 | Disposition: A | Discharge status: Home / Self Care | Payer: 59 | Source: Ambulatory Visit | Attending: Radiation Oncology | Admitting: Radiation Oncology

## 2015-10-06 VITALS — BP 125/70 | HR 72 | Temp 98.3°F | Ht 64.0 in | Wt 256.3 lb

## 2015-10-06 DIAGNOSIS — Z510 Encounter for antineoplastic radiation therapy: Secondary | ICD-10-CM | POA: Diagnosis not present

## 2015-10-06 DIAGNOSIS — D0512 Intraductal carcinoma in situ of left breast: Secondary | ICD-10-CM | POA: Diagnosis not present

## 2015-10-06 DIAGNOSIS — C50112 Malignant neoplasm of central portion of left female breast: Secondary | ICD-10-CM

## 2015-10-06 DIAGNOSIS — Z170 Estrogen receptor positive status [ER+]: Secondary | ICD-10-CM | POA: Insufficient documentation

## 2015-10-06 DIAGNOSIS — Z880 Allergy status to penicillin: Secondary | ICD-10-CM | POA: Insufficient documentation

## 2015-10-06 DIAGNOSIS — Z9889 Other specified postprocedural states: Secondary | ICD-10-CM | POA: Diagnosis not present

## 2015-10-06 NOTE — H&P (Addendum)
Subjective:    Patient ID: Stacey Roberts is a 50 y.o. female.  HPI  Patient of Drs Jana Hakim and Ninfa Linden with plan for oncoplastic breast reconstruction. She presented initially in 2014 with bloody nipple discharge. Ductogram showed an intraductal mass. She underwent left lumpectomy 12/2012, with the final pathology DCIS low grade, ER/PR +. Breast MRI done post lumpectomy without additional concerns and she reexcision for margins. She declined adjuvant radiation. Tamoxifen started, but patient stopped as she did not like how it made her feel.   In 08/2015 MMG/US demonstrated new cluster calcifications. MRI showed NME involving the upper outer quadrant of the left breast measuring approximately 10 x 6 x 4 cm, which includes ductal enhancement in the upper outer subareolar left breast to the level of the nipple. She has had 2 biopsies one labeled breast left UOQ with DCIS and second biopsy labeled left UOQ retroareolar as fibroadipose tissue. She has undergone left lumpectomy and SLN with final pathology: 2.3 cm IDC with DCIS, ALH, DCIS focally positive posterior margin and broadly<0.1 cm from lateral and posterior margin. SLN negative  Genetics negative. Patient has questions regarding use if Mirena- reports this was placed prior to DCIS diagnosis as she has endometrial hyperplasia on biopsy. Since DCIS diagnosis has had conflicting opinions on its use, including consult at [facility] where she reports she was recommended to remove and have hysterectomy.  PMH significant for DM with last HbA1c 08/2015 8.4. Also has OSA per chart, per patient she does not have this diagnosis.  Current 46 DD, states wearing same bra post lumpectomy but not filling out left. Notes 50 lb wt gain post thyroidectomy in 2011, stable over last year. 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 She reports rashes beneath breasts more that  5 times per year which she controls with cornstarch and when she develops open excoriations Neosporin. She has been specialty fitted for bras. No history PT. Denies numbness hands.  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  Review of Systems  Psychiatric/Behavioral: The patient is nervous/anxious.   Remainder 12 point review negative    Objective:   Physical Exam  Constitutional: She is oriented to person, place, and time.  Cardiovascular: Normal rate.   Pulmonary/Chest: Effort normal.  Abdominal: Soft.  Lymphadenopathy:    She has no axillary adenopathy.  Neurological: She is alert and oriented to person, place, and time.   Grade 3 ptosis bilateral, R> L volume Left with periareolar scar from 3 to 6 o clock   SN to nipple R 37 L 36 cm BW R 25 L 24 cm Nipple to IMF R 13 L 10 cm  +shoulder grooving  Assessment:     History left breast lumpectomy for papilloma, DCIS Invasive breast ca left breast s/p lumpectomy , SLN with positive margin,  Macromastia Intertrigo    Plan:     Plan reexcision left breast for margin. This will be done at same time oncoplastic reduction bilateral. Reviewed reduction with anchor type scars, drains, post operative visits and limitations, recovery. Diminished sensation nipple and breast skin, risk of nipple loss, wound healing problems, asymmetry.  Discussed smaller breast size may aid with radiation. Discussed will have some contraction of breast volume and increased firmness with radiation, less ptosis with aging. Discussed changes with wt gain, loss, aging. If she pursues partial mastectomy, highly encourage her to pursue breast reduction prior to XRT as risk of complications  post would be very high. Counseled I cannot assure her bra size. We also discussed that her DM and less than ideal control of this will increase her risks complications including wound healing.   Irene Limbo, MD [facility] [phone],  pin [phone]

## 2015-10-06 NOTE — Progress Notes (Signed)
Radiation Oncology         [phone] ________________________________  Name: Stacey Roberts MRN: [id number]  Date: 10/06/2015  DOB: August 22, 1965  Re-Evaluation Visit Note  CC: [name], NP  Coralie Keens, MD    ICD-9-CM ICD-10-CM   1. Malignant neoplasm of central portion of left female breast ([place]) 174.1 C50.112     Diagnosis: Stage IIA (pT2, pN0), grade 1, invasive ductal carcinoma of the left breast (ER/PR positive, HER2 ?)  Interval Since Last Radiation: N/A  Narrative:  The patient returns today for a re-evaluation.  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 She developed spontaneous left bloody nipple discharge in December 2014. A mammogram was performed and negative for malignancy. A ductogram was performed which showed filling defect. She underwent a left lumpectomy, by Dr. Ninfa Linden, for this on 01/06/2013 revealing low-grade DCIS involving an intraductal papilloma. The inked surgical margin was focally positive and the tumor size was 1.5 cm. She then underwent re-excision on 01/22/2013 which which showed fibrocystic change, but also another area of focal ductal carcinoma in situ with 0.1 cm from the posterior margin. All in all, the initial specimen was 10.3 x 7.5 x 3.4 cm. The re-excision specimens were 2.3 x 1.6 x 0.5 cm and 7.8 x 6.5 x 2.1 cm. The patient refused adjuvant radiation and did not tolerate Tamoxifen for which she too for approximately 2 months. She was last seen by medical oncology in 2015.  Since then, the patient has continued with yearly mammography. Mammogram on 02/15/15, breast density category A, was negative. However, in early August 2017 she developed left nipple discharge and sometimes this was bloody. Repeat left mammography with tomography and left breast ultrasonography on 08/30/2015 was now breast density category B. Aside some two medial left breast masses and postlumpectomy  findings, none of which showed any change since 2012, there was a group of new pleomorphic calcifications posterior to the lumpectomy area spanning 1.4 cm. On physical exam, no palpable masses were appreciated. Ultrasound showed scarring behind the nipple and no definite mass.  Biopsy of the calcifications in the UOQ of the left breast on 09/02/2015 revealed grade 1 DCIS, focal invasion could not be excluded, stromal mucin with calcification, and there was not enough tissue for a prognostic panel.  Her case was presented in the multidisciplinary breast cancer conference on 09/08/2015. At that time a preliminary plan was proposed: MRI, then if possible repeat lumpectomy with sentinel lymph node sampling, followed by radiation and anti-estrogens. Genetics was also suggested.  On 09/10/2015, the patient underwent bilateral breast MRI with and without contrast. This again found the breast density to be category B. There was an area of non-masslike enhancement involving the UOQ of the left breast from the nipple posteriorly extending approximately 10 cm. The right breast was unremarkable. There was no evidence of adenopathy.  MRI guided biopsy of the upper-outer quadrant retroareolar area of the left breast on 09/16/2015 showed fat necrosis and foreign body giant cell reaction, but no evidence of malignancy.  The patient returned to Dr. Jana Hakim on 09/24/15 who discussed treatment options. Genetics was sent on 09/14/15 and if she did carry a deleterious gene, she would want bilateral mastectomies.  On 09/30/15, the patient had a left lumpectomy revealing grade 1 invasive ductal carcinoma (spanning 2.3 cm) (pT2, pN0) (ER 60% positive, PR 80% positive, HER2 ?, Ki67 3%), low grade DCIS, calcifications, and lobular neoplasia. The DCIS  was focally present at the posterior margin and broadly less than 0.1 cm to the same posterior margin. The DCIS was also broadly less than 0.1 cm to the lateral margin. The invasive  carcinoma was broadly less than 0.1 cm to the posterior margin. Biopsy of a left axillary sentinel lymph node was negative.  The patient and her fiance present today to discuss the role of radiation in the management of her disease.  On review of systems: The patient reports left breast pain as a 4/10. She takes percocet at night for this. She has a Mirena IUD in place.  ALLERGIES:  is allergic to penicillins; clindamycin/lincomycin; and ace inhibitors.  Meds: Current Outpatient Prescriptions  Medication Sig Dispense Refill  . aspirin 81 MG tablet Take 81 mg by mouth daily.      . canagliflozin (INVOKANA) 300 MG TABS tablet Take 1 tablet (300 mg total) by mouth daily. 30 tablet 2  . exenatide (BYETTA 10 MCG PEN) 10 MCG/0.04ML SOPN injection Inject 0.04 mLs (10 mcg total) into the skin 2 (two) times daily with a meal. And pen needles 2/day 2.4 mL 11  . Insulin Glargine (LANTUS SOLOSTAR) 100 UNIT/ML Solostar Pen Inject 60 Units into the skin every morning. And pen needles 1/day 10 pen 11  . levothyroxine (SYNTHROID, LEVOTHROID) 150 MCG tablet TAKE 1 TABLET BY MOUTH DAILY BEFORE BREAKFAST 30 tablet 3  . LYRICA 75 MG capsule TAKE 1 CAPSULE BY MOUTH TWICE A DAY 60 capsule 0  . metFORMIN (GLUCOPHAGE) 1000 MG tablet TAKE 1 TABLET BY MOUTH 2 TIMES DAILY WITH A MEAL. 60 tablet 6  . Multiple Vitamin (MULTIVITAMIN WITH MINERALS) TABS tablet Take 1 tablet by mouth daily.    Marland Kitchen olmesartan (BENICAR) 20 MG tablet TAKE 1 TABLET BY MOUTH DAILY 30 tablet 10  . oxyCODONE-acetaminophen (ROXICET) 5-325 MG tablet Take 1-2 tablets by mouth every 4 (four) hours as needed for severe pain. 50 tablet 0  . pantoprazole (PROTONIX) 40 MG tablet TAKE 1 TABLET BY MOUTH ONCE DAILY 90 tablet 2  . rosuvastatin (CRESTOR) 10 MG tablet Take 1 tablet (10 mg total) by mouth daily. 90 tablet 3   No current facility-administered medications for this encounter.     Physical Findings: The patient is in no acute distress. Patient is  alert and oriented.  height is '5\' 4"'  (1.626 m) and weight is 256 lb 4.8 oz (116.3 kg). Her oral temperature is 98.3 F (36.8 C). Her blood pressure is 125/70 and her pulse is 72. Her oxygen saturation is 99%.   Lungs are clear to auscultation bilaterally. Heart has regular rate and rhythm. No palpable cervical, supraclavicular, or axillary adenopathy. Abdomen soft, non-tender, normal bowel sounds. The left breast has a scar in the UOQ. No separate sentinel node scar was identified. No dominant mass appreciated in the left breast. Right breast has no palpable mass, nipple discharge/bleeding.  Lab Findings: Lab Results  Component Value Date   WBC 9.4 09/30/2015   HGB 13.8 09/30/2015   HCT 42.9 09/30/2015   MCV 85.0 09/30/2015   PLT 288 09/30/2015    Radiographic Findings: Mr Breast Bilateral W Wo Contrast  Addendum Date: 09/10/2015   ADDENDUM REPORT: 09/10/2015 09:37 ADDENDUM: In the comparison section, there is a prior post lumpectomy MRI dated 01/18/2013 which is correlated. Electronically Signed   By: Evangeline Dakin M.D.   On: 09/10/2015 09:37   Result Date: 09/10/2015 CLINICAL DATA:  50 year old who presented in 2014 with a spontaneous nipple discharge, found  at that time to have a papilloma associated with DCIS for which she underwent lumpectomy. Recent diagnostic mammography demonstrated new calcifications in the upper outer quadrant, biopsy proven DCIS. Pre-treatment MRI requested to evaluate extent of disease. LABS:  Creatinine was obtained on site at [place] at [address] Results: Creatinine 0.5 mg/dL.  Estimated GFR 131. EXAM: BILATERAL BREAST MRI WITH AND WITHOUT CONTRAST TECHNIQUE: Multiplanar, multisequence MR images of both breasts were obtained prior to and following the intravenous administration of 20 ml of Multihance. THREE-DIMENSIONAL MR IMAGE RENDERING ON INDEPENDENT WORKSTATION: Three-dimensional MR images were rendered by post-processing of the original  MR data on an independent workstation. The three-dimensional MR images were interpreted, and findings are reported in the following complete MRI report for this study. Three dimensional images were evaluated at the independent DynaCad workstation. COMPARISON:  No prior MRI. Mammography 09/02/2015 (left), 08/30/2015 (left), 02/15/2015 (bilateral) and earlier. FINDINGS: Breast composition: b. Scattered fibroglandular tissue. Background parenchymal enhancement: Minimal. Right breast: No mass or abnormal enhancement. Left breast: Blooming artifact from the surgical clips placed the time of prior lumpectomy. Non mass enhancement involving the upper outer quadrant of the left breast measuring approximately 10 x 6 x 4 cm, which includes ductal enhancement in the upper outer subareolar left breast to the level of the nipple. Lymph nodes: No abnormal appearing lymph nodes. Ancillary findings:  None. IMPRESSION: 1. Segmental non mass enhancement indicating DCIS involving the upper outer quadrant of the left breast from the nipple posteriorly. From the nipple posteriorly, the enhancement extends approximately 10 cm. 2. No MRI evidence of malignancy involving the right breast. 3. No pathologic lymphadenopathy. RECOMMENDATION: If breast conservation surgery is contemplated, MRI biopsy of the ductal enhancement in the upper outer subareolar left breast is recommended to confirm the anterior extent of the presumed DCIS. BI-RADS CATEGORY  4: Suspicious. Electronically Signed: By: Evangeline Dakin M.D. On: 09/10/2015 09:32   Nm Sentinel Node Inj-no Rpt (breast)  Result Date: 09/30/2015 CLINICAL DATA: left breast dcis Sulfur colloid was injected intradermally by the nuclear medicine technologist for breast cancer sentinel node localization.   Mm Breast Surgical Specimen  Result Date: 09/30/2015 CLINICAL DATA:  Status post left lumpectomy. EXAM: SPECIMEN RADIOGRAPH OF THE LEFT BREAST COMPARISON:  Previous exam(s). FINDINGS:  Status post excision of the left breast. The 3 wires, dumbbell-shaped biopsy marker and ribbon shaped biopsy marker are present within the specimen radiograph. The biopsy markers are marked for pathology. IMPRESSION: Specimen radiograph of the left breast. Electronically Signed   By: Pamelia Hoit M.D.   On: 09/30/2015 16:52   Mm Digital Diagnostic Unilat L  Result Date: 09/16/2015 CLINICAL DATA:  Status post MR guided core needle biopsy of the upper-outer retroareolar portion of a 10 x 6 x 4 cm area of non mass enhancement in the upper-outer quadrant of the left breast. Recent biopsy of the posterior aspect of this area demonstrated ductal carcinoma in situ. Status post left lumpectomy for a papilloma and ductal carcinoma in situ in 2014. There was a focally positive margin at that time and the patient declined radiation therapy. EXAM: DIAGNOSTIC LEFT MAMMOGRAM POST MRI BIOPSY COMPARISON:  Previous exam(s). FINDINGS: Mammographic images were obtained following MR guided biopsy of the upper-outer retroareolar portion of the recently demonstrated 10 x 6 x 4 cm area of non mass enhancement in the upper-outer quadrant of the left breast. These demonstrate an hourglass shaped biopsy marker clip at the location of the biopsy. This is located 7.7 cm  anterior, inferior and medial to the recently placed ribbon shaped biopsy marker clip. IMPRESSION: Appropriate clip deployment following left breast MR guided core needle biopsy. Final Assessment: Post Procedure Mammograms for Marker Placement Electronically Signed   By: Claudie Revering M.D.   On: 09/16/2015 09:36   Mr Aundra Millet Breast Bx Johnella Moloney Dev 1st Lesion Image Bx Spec Mr Guide  Addendum Date: 09/20/2015   ADDENDUM REPORT: 09/17/2015 10:35 ADDENDUM: Pathology revealed fibroadipose tissue with fat necrosis and foreign body cell reaction in the left breast. This was found to be concordant by Dr. Abelardo Diesel. Pathology results were discussed with the patient by telephone. The  patient reported doing well after the biopsy with tenderness at the site. Post biopsy instructions and care were reviewed and questions were answered. The patient was encouraged to call [facility] for any additional concerns. There is a large area of enhancement in the left breast, and if breast conservation is desired, there should be a bracketed needle localization of the two clips to include the entire area. Treatment plan for known left breast cancer is recommended. Pathology results reported by Susa Raring RN, BSN on 09/17/2015. Electronically Signed   By: Claudie Revering M.D.   On: 09/17/2015 10:35   Result Date: 09/20/2015 CLINICAL DATA:  10 x 6 x 4 cm area of non mass enhancement in the upper-outer quadrant of the left breast extending from an area of recently biopsied ductal carcinoma in situ to the nipple. The upper-outer retroareolar portion of this was recommended for biopsy to determine anterior extent of disease. EXAM: MRI GUIDED CORE NEEDLE BIOPSY OF THE LEFT BREAST TECHNIQUE: Multiplanar, multisequence MR imaging of the left breast was performed both before and after administration of intravenous contrast. CONTRAST:  12m MULTIHANCE GADOBENATE DIMEGLUMINE 529 MG/ML IV SOLN COMPARISON:  Previous exams. FINDINGS: I met with the patient, and we discussed the procedure of MRI guided biopsy, including risks, benefits, and alternatives. Specifically, we discussed the risks of infection, bleeding, tissue injury, clip migration, and inadequate sampling. Informed, written consent was given. The usual time out protocol was performed immediately prior to the procedure. Using sterile technique, 1% Lidocaine, MRI guidance, and a 9 gauge vacuum assisted device, biopsy was performed of the upper outer retroareolar portion of the recently demonstrated 10 x 6 x 4 cm area of non mass enhancement in the upper-outer quadrant of the left breast using a lateral approach. At the conclusion of  the procedure, a hourglass shaped tissue marker clip was deployed into the biopsy cavity. Follow-up 2-view mammogram was performed and dictated separately. IMPRESSION: MRI guided biopsy of the upper-outer retroareolar portion of a 10 x 6 x 4 mm area of non mass enhancement in the upper-outer quadrant of the left breast. No apparent complications. Electronically Signed: By: SClaudie ReveringM.D. On: 09/16/2015 09:32   Mm Lt Plc Breast Loc Dev   1st Lesion  Inc Mammo Guide  Result Date: 09/30/2015 CLINICAL DATA:  Preoperative bracketed needle localization. EXAM: NEEDLE LOCALIZATION OF THE LEFT BREAST WITH MAMMO GUIDANCE COMPARISON:  Previous exams. FINDINGS: Patient presents for needle localization prior to left breast lumpectomy. I met with the patient and we discussed the procedure of needle localization including benefits and alternatives. We discussed the high likelihood of a successful procedure. We discussed the risks of the procedure, including infection, bleeding, tissue injury, and further surgery. Informed, written consent was given. The usual time-out protocol was performed immediately prior to the procedure. Using mammographic guidance,  sterile technique, 1% lidocaine and a 7 cm modified Kopans needle, a posterior ribbon shaped marker was localized using superior approach. Next, using similar technique and 7 cm modified Kopans needle, a more anteriorly located dumbbell-shaped marker was localized using superior approach. The first wire which was used for this localization displaced during removal of the needle, and because of this, a second 7 cm modified Kopans needle was used to localize this area. The appropriate wire was clearly marked. The images were marked for Dr. Ninfa Linden. IMPRESSION: Needle localization of left breast. No apparent complications. Electronically Signed   By: Fidela Salisbury M.D.   On: 09/30/2015 16:34   Mm Lt Plc Breast Loc Dev   Ea Add Lesion  Inc Mammo Guide  Result Date:  09/30/2015 CLINICAL DATA:  Preoperative bracketed needle localization. EXAM: NEEDLE LOCALIZATION OF THE LEFT BREAST WITH MAMMO GUIDANCE COMPARISON:  Previous exams. FINDINGS: Patient presents for needle localization prior to left breast lumpectomy. I met with the patient and we discussed the procedure of needle localization including benefits and alternatives. We discussed the high likelihood of a successful procedure. We discussed the risks of the procedure, including infection, bleeding, tissue injury, and further surgery. Informed, written consent was given. The usual time-out protocol was performed immediately prior to the procedure. Using mammographic guidance, sterile technique, 1% lidocaine and a 7 cm modified Kopans needle, a posterior ribbon shaped marker was localized using superior approach. Next, using similar technique and 7 cm modified Kopans needle, a more anteriorly located dumbbell-shaped marker was localized using superior approach. The first wire which was used for this localization displaced during removal of the needle, and because of this, a second 7 cm modified Kopans needle was used to localize this area. The appropriate wire was clearly marked. The images were marked for Dr. Ninfa Linden. IMPRESSION: Needle localization of left breast. No apparent complications. Electronically Signed   By: Fidela Salisbury M.D.   On: 09/30/2015 16:34    Impression: Stage IIA (pT2, pN0), grade 1, invasive ductal carcinoma of the left breast (ER/PR positive, HER2 ?)  The patient would be a good candidate for breast conservation therapy with re-excision in light of her close surgical margins followed by radiation therapy. The patient would also proceed with breast reduction at the same time as her re-excision.  Plan: The patient will be seen in a post-operative setting for a re-evaluation. Her next surgery is scheduled on 10/12/15 by Dr. Ninfa Linden and Dr. Iran Planas. We will see her approximately 3 weeks  after this.  ____________________________________ -----------------------------------  Blair Promise, PhD, MD  This document serves as a record of services personally performed by Gery Pray, MD. It was created on his behalf by Darcus Austin, a trained medical scribe. The creation of this record is based on the scribe's personal observations and the provider's statements to them. This document has been checked and approved by the attending provider.

## 2015-10-07 ENCOUNTER — Encounter: Payer: Self-pay | Admitting: *Deleted

## 2015-10-07 ENCOUNTER — Encounter (HOSPITAL_COMMUNITY)
Admission: RE | Admit: 2015-10-07 | Discharge: 2015-10-07 | Disposition: A | Discharge status: Home / Self Care | Payer: 59 | Source: Ambulatory Visit | Attending: Plastic Surgery | Admitting: Plastic Surgery

## 2015-10-07 ENCOUNTER — Other Ambulatory Visit: Payer: Self-pay | Admitting: Surgery

## 2015-10-07 DIAGNOSIS — E119 Type 2 diabetes mellitus without complications: Secondary | ICD-10-CM | POA: Diagnosis not present

## 2015-10-07 LAB — GLUCOSE, CAPILLARY: Glucose-Capillary: 162 mg/dL — ABNORMAL HIGH (ref 65–99)

## 2015-10-07 NOTE — Addendum Note (Signed)
Encounter addended by: Jacqulyn Liner, RN on: 10/07/2015 10:03 AM<BR>    Actions taken: Charge Capture section accepted

## 2015-10-07 NOTE — Progress Notes (Signed)
[place] Social Work  Clinical Social Work was referred by Therapist, sports  to review and complete healthcare advance directives.  Clinical Social Worker provided with previously completed/notarized ADRs in [place] office.  The patient designated [place] Nickel as their primary healthcare agent and Regino Schultze as their secondary agent.  Patient also completed healthcare living will.    Clinical Social Worker will send documents to medical records to be scanned into patient's chart. Clinical Social Worker encouraged patient/family to contact with any additional questions or concerns.  Loren Racer, [place] Worker [place]  [place] Phone: [phone] Fax: [phone]

## 2015-10-07 NOTE — Pre-Procedure Instructions (Signed)
Stacey Roberts  10/07/2015      [place], [place] - [address] [facility]. [address] [place] [address] Phone: [phone] Fax: [phone]  [place], [place] - [place] [address] Phone: [phone] Fax: [phone]    Your procedure is scheduled on   Tuesday 10/12/15  Report to [facility] Admitting at 1015 A.M.  Call this number if you have problems the morning of surgery:  [phone]   Remember:  Do not eat food or drink liquids after midnight.  Take these medicines the morning of surgery with A SIP OF WATER   LEVOTHYROXINE (SYNTHROID), LYRICA, OXYCODONE IF NEEDED, PANTOPRAZOLE (PROTONIX)                    (STOP 7 DAYS PRIOR TO SURGERY ASPIRIN, OR ASPIRIN PRODUCTS, IBUPROFEN/ ADVIL/ MOTRIN/ ALEVE, GOODY POWDERS/ BC'S, HERBAL MEDICINES, VITAMINS/ MULTIVITAMIN)    How to Manage Your Diabetes Before and After Surgery  Why is it important to control my blood sugar before and after surgery? . Improving blood sugar levels before and after surgery helps healing and can limit problems. . A way of improving blood sugar control is eating a healthy diet by: o  Eating less sugar and carbohydrates o  Increasing activity/exercise o  Talking with your doctor about reaching your blood sugar goals . High blood sugars (greater than 180 mg/dL) can raise your risk of infections and slow your recovery, so you will need to focus on controlling your diabetes during the weeks before surgery. . Make sure that the doctor who takes care of your diabetes knows about your planned surgery including the date and location.  How do I manage my blood sugar before surgery? . Check your blood sugar at least 4 times a day, starting 2 days before surgery, to make sure that the level is not too high or low. o Check your blood sugar the morning of your surgery when you  wake up and every 2 hours until you get to the Short Stay unit. . If your blood sugar is less than 70 mg/dL, you will need to treat for low blood sugar: o Do not take insulin. o Treat a low blood sugar (less than 70 mg/dL) with  cup of clear juice (cranberry or apple), 4 glucose tablets, OR glucose gel. o Recheck blood sugar in 15 minutes after treatment (to make sure it is greater than 70 mg/dL). If your blood sugar is not greater than 70 mg/dL on recheck, call [phone] for further instructions. . Report your blood sugar to the short stay nurse when you get to Short Stay.  . If you are admitted to the hospital after surgery: o Your blood sugar will be checked by the staff and you will probably be given insulin after surgery (instead of oral diabetes medicines) to make sure you have good blood sugar levels. o The goal for blood sugar control after surgery is 80-180 mg/dL.              WHAT DO I DO ABOUT MY DIABETES MEDICATION?   Marland Kitchen Do not take oral diabetes medicines (pills) the morning of surgery.  . THE NIGHT BEFORE SURGERY, take ___________ units of ___________insulin.       . THE MORNING OF SURGERY, take ______ 30_______ units of __LANTUS________insulin.  . The day of surgery, do not take other  diabetes injectables, including Byetta (exenatide), Bydureon (exenatide ER), Victoza (liraglutide), or Trulicity (dulaglutide).  . If your CBG is greater than 220 mg/dL, you may take  of your sliding scale (correction) dose of insulin.  Other Instructions:          Patient Signature:  Date:   Nurse Signature:  Date:   Reviewed and Endorsed by [facility] Patient Education Committee, August 2015  Do not wear jewelry, make-up or nail polish.  Do not wear lotions, powders, or perfumes, or deoderant.  Do not shave 48 hours prior to surgery.  Men may shave face and neck.  Do not bring valuables to the hospital.  [facility] is not responsible for any belongings or  valuables.  Contacts, dentures or bridgework may not be worn into surgery.  Leave your suitcase in the car.  After surgery it may be brought to your room.  For patients admitted to the hospital, discharge time will be determined by your treatment team.  Patients discharged the day of surgery will not be allowed to drive home.   Name and phone number of your driver:    Special instructions:  [place] - Preparing for Surgery  Before surgery, you can play an important role.  Because skin is not sterile, your skin needs to be as free of germs as possible.  You can reduce the number of germs on you skin by washing with CHG (chlorahexidine gluconate) soap before surgery.  CHG is an antiseptic cleaner which kills germs and bonds with the skin to continue killing germs even after washing.  Please DO NOT use if you have an allergy to CHG or antibacterial soaps.  If your skin becomes reddened/irritated stop using the CHG and inform your nurse when you arrive at Short Stay.  Do not shave (including legs and underarms) for at least 48 hours prior to the first CHG shower.  You may shave your face.  Please follow these instructions carefully:   1.  Shower with CHG Soap the night before surgery and the                                morning of Surgery.  2.  If you choose to wash your hair, wash your hair first as usual with your       normal shampoo.  3.  After you shampoo, rinse your hair and body thoroughly to remove the                      Shampoo.  4.  Use CHG as you would any other liquid soap.  You can apply chg directly       to the skin and wash gently with scrungie or a clean washcloth.  5.  Apply the CHG Soap to your body ONLY FROM THE NECK DOWN.        Do not use on open wounds or open sores.  Avoid contact with your eyes,       ears, mouth and genitals (private parts).  Wash genitals (private parts)       with your normal soap.  6.  Wash thoroughly, paying special attention to the area where  your surgery        will be performed.  7.  Thoroughly rinse your body with warm water from the neck down.  8.  DO NOT shower/wash with your normal soap after using and  rinsing off       the CHG Soap.  9.  Pat yourself dry with a clean towel.            10.  Wear clean pajamas.            11.  Place clean sheets on your bed the night of your first shower and do not        sleep with pets.  Day of Surgery  Do not apply any lotions/deoderants the morning of surgery.  Please wear clean clothes to the hospital/surgery center.    Please read over the following fact sheets that you were given. Surgical Site Infection Prevention

## 2015-10-08 ENCOUNTER — Encounter: Payer: Self-pay | Admitting: Pharmacist

## 2015-10-08 ENCOUNTER — Telehealth: Payer: Self-pay | Admitting: *Deleted

## 2015-10-08 NOTE — Patient Outreach (Signed)
[facility]) Care Management  [place]   10/08/2015  Stacey Roberts Sep 28, 1965 [id number]  Subjective: Patient presents today for 3 month diabetes follow-up as part of the employer-sponsored Link to Wellness program.  Current diabetes regimen includes Invokana, Byetta, Lantus, and Metformin.  Patient also continues on daily aspirin, olmesartan and  rosuvastatin.  Most recent MD follow-up was 09/23/15 with Dr Loanne Drilling.  Patient has a pending appt for next week with podiatry. She states she has lumbectomy next Tuesday planned then plans to start radiation a few weeks later. She states she will be on bed rest for a couple weeks post surgery. Also she has problems with tooth that prevents from eating hot or cold and has to chew on the other side.     Patient reported dietary habits: Eats 3 meals/day Breakfast:2 eggs, toast Lunch:turkey sandwich Dinner:  Meat, vegetable, rice Snacks:Chips, rarely sweets Drinks: Mostly water, diet lipton iced tea.  She states she often eats out a lot since she has been having surgeries and her significant other brings her [place] food, pizza, and other fast food.   Patient reported exercise habits: No structured exercise.  Tries to walk at lunch and at breaks.  Forgets carb tracker and my fitness pal app.    Patient denies hypoglycemic events. Reports proper treatment Patient reports nocturia 1-2 times per night.  Patient denies pain/burning upon urination.  Patient denies neuropathy. Patient denies visual changes. Patient reports self foot exams. Denies changes.   Patient reported self monitored blood glucose frequency once daily fasting.  Home fasting CBG: 161 mg/dL (she did not bring meter to clinic today)  Objective:  Lab Results  Component Value Date   HGBA1C 8.6 09/23/2015    Lipid Panel     Component Value Date/Time   CHOL 139 07/20/2015 0858   TRIG 156.0 (H) 07/20/2015 0858   HDL 48.60 07/20/2015 0858   CHOLHDL 3  07/20/2015 0858   VLDL 31.2 07/20/2015 0858   LDLCALC 60 07/20/2015 0858   LDLDIRECT 153.0 09/01/2014 1634    Encounter Medications: Outpatient Encounter Prescriptions as of 10/08/2015  Medication Sig  . aspirin 81 MG tablet Take 81 mg by mouth daily.    . canagliflozin (INVOKANA) 300 MG TABS tablet Take 1 tablet (300 mg total) by mouth daily.  Marland Kitchen exenatide (BYETTA 10 MCG PEN) 10 MCG/0.04ML SOPN injection Inject 0.04 mLs (10 mcg total) into the skin 2 (two) times daily with a meal. And pen needles 2/day  . Insulin Glargine (LANTUS SOLOSTAR) 100 UNIT/ML Solostar Pen Inject 60 Units into the skin every morning. And pen needles 1/day  . levothyroxine (SYNTHROID, LEVOTHROID) 150 MCG tablet TAKE 1 TABLET BY MOUTH DAILY BEFORE BREAKFAST  . LYRICA 75 MG capsule TAKE 1 CAPSULE BY MOUTH TWICE A DAY  . metFORMIN (GLUCOPHAGE) 1000 MG tablet TAKE 1 TABLET BY MOUTH 2 TIMES DAILY WITH A MEAL.  . Multiple Vitamin (MULTIVITAMIN WITH MINERALS) TABS tablet Take 1 tablet by mouth daily.  Marland Kitchen olmesartan (BENICAR) 20 MG tablet TAKE 1 TABLET BY MOUTH DAILY  . oxyCODONE-acetaminophen (ROXICET) 5-325 MG tablet Take 1-2 tablets by mouth every 4 (four) hours as needed for severe pain.  . pantoprazole (PROTONIX) 40 MG tablet TAKE 1 TABLET BY MOUTH ONCE DAILY  . rosuvastatin (CRESTOR) 10 MG tablet Take 1 tablet (10 mg total) by mouth daily.   No facility-administered encounter medications on file as of 10/08/2015.     Functional Status: In your present state of health,  do you have any difficulty performing the following activities: 09/30/2015 02/15/2015  Hearing? N N  Vision? N N  Difficulty concentrating or making decisions? N N  Walking or climbing stairs? N N  Dressing or bathing? N N  Doing errands, shopping? - N  Some recent data might be hidden    Fall/Depression Screening: PHQ 2/9 Scores 07/20/2015 02/15/2015 08/05/2014 09/09/2013  PHQ - 2 Score 0 0 0 1     Assessment:  Diabetes: Most recent A1C was 8.6%  which is at above goal of less than 7%. She has a surgery for breast cancer coming up and radiation to follow.   Lifestyle improvements:  Physical Activity- No current exercise regimen  Nutrition- Often eats out most days of the week, is not following a low carbohydrate diet.    Plan/Goals for Next Visit: -Discussed importance of low carbohydrate diet and its effect on blood glucose control.  Discussed exercise goals once she has clearance from her surgeons in the next few months -Instructed patient to continue to monitor blood glucose at least once daily, especially post surgery as her blood glucose may change -Encouraged patient to make healthy low carbohydrate food choices especially when eating out and discussed low carbohydrate food choices at fast food restaurants.   -Encouraged patient to start light walking exercise once cleared by her surgeon or primary care physician  Next appointment to see me is: 3 months (telephonic)  Bennye Alm, PharmD [facility] PGY2 Pharmacy Resident [phone]  [facility] CM Care Plan Problem One   Flowsheet Row Most Recent Value  Care Plan Problem One  Inconsistent diet  Role Documenting the Problem One  Clinical Pharmacist  Care Plan for Problem One  Active  [facility] Long Term Goal (31-90 days)  Patient will make low carbohydrate food choices when eating out over the next 90 days as measured by patient report  [facility] Long Term Goal Start Date  10/08/15  Interventions for Problem One Long Term Goal  Discussed the importance of monitoring carbohydrate intake and following diabetic diet (45-60 grams of carbs/meal and 15 grams of carbs for snacks).  Counseled on importance of low carbohydrate diet to achieve a goal A1C    [facility] CM Care Plan Problem Two   Flowsheet Row Most Recent Value  Care Plan Problem Two  Monitoring blood glucose  Role Documenting the Problem Two  Clinical Pharmacist  Care Plan for Problem Two  Active  Interventions for Problem Two Long Term Goal    Discussed importance of checking her blood glucose especially during illness and post surgery which is coming up  [facility] Long Term Goal (31-90) days  Patient will resume monitoring blood glucose at least once daily over the next 90 days per patient report.  [facility] Long Term Goal Start Date  10/08/15

## 2015-10-08 NOTE — Telephone Encounter (Signed)
Ordered oncotype per Dr. Magrinat.  Faxed requisition to pathology and confirmed receipt.  

## 2015-10-10 DIAGNOSIS — N651 Disproportion of reconstructed breast: Secondary | ICD-10-CM | POA: Diagnosis not present

## 2015-10-10 DIAGNOSIS — N62 Hypertrophy of breast: Secondary | ICD-10-CM | POA: Diagnosis not present

## 2015-10-10 DIAGNOSIS — D0512 Intraductal carcinoma in situ of left breast: Secondary | ICD-10-CM | POA: Diagnosis not present

## 2015-10-11 ENCOUNTER — Encounter (INDEPENDENT_AMBULATORY_CARE_PROVIDER_SITE_OTHER): Payer: 59 | Admitting: Podiatry

## 2015-10-11 ENCOUNTER — Encounter (HOSPITAL_BASED_OUTPATIENT_CLINIC_OR_DEPARTMENT_OTHER): Payer: Self-pay | Admitting: *Deleted

## 2015-10-11 NOTE — Progress Notes (Signed)
This encounter was created in error - please disregard.

## 2015-10-12 ENCOUNTER — Encounter (HOSPITAL_BASED_OUTPATIENT_CLINIC_OR_DEPARTMENT_OTHER): Payer: Self-pay | Admitting: *Deleted

## 2015-10-12 ENCOUNTER — Ambulatory Visit (HOSPITAL_BASED_OUTPATIENT_CLINIC_OR_DEPARTMENT_OTHER): Payer: 59 | Admitting: Anesthesiology

## 2015-10-12 ENCOUNTER — Ambulatory Visit (HOSPITAL_BASED_OUTPATIENT_CLINIC_OR_DEPARTMENT_OTHER)
Admission: RE | Admit: 2015-10-12 | Discharge: 2015-10-12 | Disposition: A | Discharge status: Home / Self Care | Payer: 59 | Source: Ambulatory Visit | Attending: Plastic Surgery | Admitting: Plastic Surgery

## 2015-10-12 ENCOUNTER — Encounter (HOSPITAL_BASED_OUTPATIENT_CLINIC_OR_DEPARTMENT_OTHER)
Admission: RE | Disposition: A | Discharge status: Home / Self Care | Payer: Self-pay | Source: Ambulatory Visit | Attending: Plastic Surgery

## 2015-10-12 DIAGNOSIS — C50412 Malignant neoplasm of upper-outer quadrant of left female breast: Secondary | ICD-10-CM | POA: Diagnosis not present

## 2015-10-12 DIAGNOSIS — D0512 Intraductal carcinoma in situ of left breast: Secondary | ICD-10-CM | POA: Diagnosis not present

## 2015-10-12 DIAGNOSIS — Z79899 Other long term (current) drug therapy: Secondary | ICD-10-CM | POA: Insufficient documentation

## 2015-10-12 DIAGNOSIS — E669 Obesity, unspecified: Secondary | ICD-10-CM | POA: Insufficient documentation

## 2015-10-12 DIAGNOSIS — Z170 Estrogen receptor positive status [ER+]: Secondary | ICD-10-CM | POA: Insufficient documentation

## 2015-10-12 DIAGNOSIS — E119 Type 2 diabetes mellitus without complications: Secondary | ICD-10-CM | POA: Diagnosis not present

## 2015-10-12 DIAGNOSIS — Z85850 Personal history of malignant neoplasm of thyroid: Secondary | ICD-10-CM | POA: Diagnosis not present

## 2015-10-12 DIAGNOSIS — G4733 Obstructive sleep apnea (adult) (pediatric): Secondary | ICD-10-CM | POA: Diagnosis not present

## 2015-10-12 DIAGNOSIS — G4730 Sleep apnea, unspecified: Secondary | ICD-10-CM | POA: Diagnosis not present

## 2015-10-12 DIAGNOSIS — N62 Hypertrophy of breast: Secondary | ICD-10-CM | POA: Diagnosis not present

## 2015-10-12 DIAGNOSIS — N651 Disproportion of reconstructed breast: Secondary | ICD-10-CM | POA: Diagnosis not present

## 2015-10-12 DIAGNOSIS — C50912 Malignant neoplasm of unspecified site of left female breast: Secondary | ICD-10-CM | POA: Diagnosis not present

## 2015-10-12 DIAGNOSIS — E785 Hyperlipidemia, unspecified: Secondary | ICD-10-CM | POA: Insufficient documentation

## 2015-10-12 DIAGNOSIS — Z87891 Personal history of nicotine dependence: Secondary | ICD-10-CM | POA: Insufficient documentation

## 2015-10-12 DIAGNOSIS — N6012 Diffuse cystic mastopathy of left breast: Secondary | ICD-10-CM | POA: Diagnosis not present

## 2015-10-12 DIAGNOSIS — N6011 Diffuse cystic mastopathy of right breast: Secondary | ICD-10-CM | POA: Diagnosis not present

## 2015-10-12 DIAGNOSIS — E890 Postprocedural hypothyroidism: Secondary | ICD-10-CM | POA: Insufficient documentation

## 2015-10-12 DIAGNOSIS — I10 Essential (primary) hypertension: Secondary | ICD-10-CM | POA: Diagnosis not present

## 2015-10-12 DIAGNOSIS — Z6841 Body Mass Index (BMI) 40.0 and over, adult: Secondary | ICD-10-CM | POA: Insufficient documentation

## 2015-10-12 DIAGNOSIS — K219 Gastro-esophageal reflux disease without esophagitis: Secondary | ICD-10-CM | POA: Diagnosis not present

## 2015-10-12 DIAGNOSIS — Z794 Long term (current) use of insulin: Secondary | ICD-10-CM | POA: Diagnosis not present

## 2015-10-12 HISTORY — PX: BREAST REDUCTION SURGERY: SHX8

## 2015-10-12 HISTORY — PX: EXCISION OF BREAST LESION: SHX6676

## 2015-10-12 HISTORY — PX: BREAST RECONSTRUCTION: SHX9

## 2015-10-12 LAB — GLUCOSE, CAPILLARY
Glucose-Capillary: 140 mg/dL — ABNORMAL HIGH (ref 65–99)
Glucose-Capillary: 150 mg/dL — ABNORMAL HIGH (ref 65–99)

## 2015-10-12 SURGERY — RECONSTRUCTION, BREAST
Anesthesia: General | Site: Breast | Laterality: Left

## 2015-10-12 MED ORDER — PHENYLEPHRINE HCL 10 MG/ML IJ SOLN
INTRAMUSCULAR | Status: DC | PRN
  Administered 2015-10-12 (×3): 40 ug via INTRAVENOUS
  Administered 2015-10-12: 80 ug via INTRAVENOUS

## 2015-10-12 MED ORDER — OXYCODONE-ACETAMINOPHEN 5-325 MG PO TABS: 1.0000 | ORAL_TABLET | ORAL | 0 refills | Status: DC | PRN

## 2015-10-12 MED ORDER — CIPROFLOXACIN IN D5W 400 MG/200ML IV SOLN
INTRAVENOUS | Status: AC
  Filled 2015-10-12: qty 200

## 2015-10-12 MED ORDER — CIPROFLOXACIN IN D5W 400 MG/200ML IV SOLN
400.0000 mg | INTRAVENOUS | Status: AC
  Administered 2015-10-12: 400 mg via INTRAVENOUS

## 2015-10-12 MED ORDER — FENTANYL CITRATE (PF) 100 MCG/2ML IJ SOLN
INTRAMUSCULAR | Status: AC
  Filled 2015-10-12: qty 4

## 2015-10-12 MED ORDER — BUPIVACAINE-EPINEPHRINE (PF) 0.5% -1:200000 IJ SOLN
INTRAMUSCULAR | Status: AC
  Filled 2015-10-12: qty 60

## 2015-10-12 MED ORDER — LIDOCAINE 2% (20 MG/ML) 5 ML SYRINGE
INTRAMUSCULAR | Status: AC
  Filled 2015-10-12: qty 5

## 2015-10-12 MED ORDER — CHLORHEXIDINE GLUCONATE CLOTH 2 % EX PADS: 6.0000 | MEDICATED_PAD | Freq: Once | CUTANEOUS | Status: DC

## 2015-10-12 MED ORDER — PROMETHAZINE HCL 25 MG/ML IJ SOLN: 6.2500 mg | INTRAMUSCULAR | Status: DC | PRN

## 2015-10-12 MED ORDER — MIDAZOLAM HCL 2 MG/2ML IJ SOLN
INTRAMUSCULAR | Status: AC
  Filled 2015-10-12: qty 2

## 2015-10-12 MED ORDER — FENTANYL CITRATE (PF) 100 MCG/2ML IJ SOLN
INTRAMUSCULAR | Status: AC
  Filled 2015-10-12: qty 2

## 2015-10-12 MED ORDER — FENTANYL CITRATE (PF) 100 MCG/2ML IJ SOLN
25.0000 ug | INTRAMUSCULAR | Status: DC | PRN
  Administered 2015-10-12 (×2): 25 ug via INTRAVENOUS
  Administered 2015-10-12: 50 ug via INTRAVENOUS
  Administered 2015-10-12 (×2): 25 ug via INTRAVENOUS

## 2015-10-12 MED ORDER — 0.9 % SODIUM CHLORIDE (POUR BTL) OPTIME
TOPICAL | Status: DC | PRN
  Administered 2015-10-12: 1000 mL

## 2015-10-12 MED ORDER — OXYCODONE HCL 5 MG PO TABS
5.0000 mg | ORAL_TABLET | Freq: Once | ORAL | Status: AC | PRN
  Administered 2015-10-12: 5 mg via ORAL

## 2015-10-12 MED ORDER — DEXAMETHASONE SODIUM PHOSPHATE 10 MG/ML IJ SOLN
INTRAMUSCULAR | Status: AC
  Filled 2015-10-12: qty 1

## 2015-10-12 MED ORDER — SUCCINYLCHOLINE CHLORIDE 200 MG/10ML IV SOSY
PREFILLED_SYRINGE | INTRAVENOUS | Status: AC
  Filled 2015-10-12: qty 10

## 2015-10-12 MED ORDER — GLYCOPYRROLATE 0.2 MG/ML IJ SOLN: 0.2000 mg | Freq: Once | INTRAMUSCULAR | Status: DC | PRN

## 2015-10-12 MED ORDER — ACETAMINOPHEN 500 MG PO TABS
1000.0000 mg | ORAL_TABLET | ORAL | Status: AC
  Administered 2015-10-12: 1000 mg via ORAL

## 2015-10-12 MED ORDER — CELECOXIB 200 MG PO CAPS
ORAL_CAPSULE | ORAL | Status: AC
  Filled 2015-10-12: qty 2

## 2015-10-12 MED ORDER — LIDOCAINE 2% (20 MG/ML) 5 ML SYRINGE
INTRAMUSCULAR | Status: DC | PRN
  Administered 2015-10-12: 100 mg via INTRAVENOUS

## 2015-10-12 MED ORDER — SUCCINYLCHOLINE CHLORIDE 200 MG/10ML IV SOSY
PREFILLED_SYRINGE | INTRAVENOUS | Status: DC | PRN
  Administered 2015-10-12: 140 mg via INTRAVENOUS

## 2015-10-12 MED ORDER — OXYCODONE HCL 5 MG PO TABS: 5.0000 mg | ORAL_TABLET | Freq: Once | ORAL | Status: DC

## 2015-10-12 MED ORDER — LACTATED RINGERS IV SOLN
INTRAVENOUS | Status: DC
  Administered 2015-10-12 (×2): via INTRAVENOUS

## 2015-10-12 MED ORDER — SODIUM CHLORIDE 0.9 % IV SOLN
INTRAVENOUS | Status: DC | PRN
  Administered 2015-10-12: 40 mL

## 2015-10-12 MED ORDER — CHLORHEXIDINE GLUCONATE CLOTH 2 % EX PADS
6.0000 | MEDICATED_PAD | Freq: Once | CUTANEOUS | Status: DC
Start: 2015-10-12 — End: 2015-10-12

## 2015-10-12 MED ORDER — DEXAMETHASONE SODIUM PHOSPHATE 4 MG/ML IJ SOLN
INTRAMUSCULAR | Status: DC | PRN
  Administered 2015-10-12: 10 mg via INTRAVENOUS

## 2015-10-12 MED ORDER — OXYCODONE HCL 5 MG PO TABS
ORAL_TABLET | ORAL | Status: AC
  Filled 2015-10-12: qty 1

## 2015-10-12 MED ORDER — PHENYLEPHRINE HCL 10 MG/ML IJ SOLN
INTRAVENOUS | Status: DC | PRN
  Administered 2015-10-12: 40 ug/min via INTRAVENOUS

## 2015-10-12 MED ORDER — SODIUM CHLORIDE 0.9 % IJ SOLN
INTRAMUSCULAR | Status: AC
  Filled 2015-10-12: qty 20

## 2015-10-12 MED ORDER — ACETAMINOPHEN 500 MG PO TABS
ORAL_TABLET | ORAL | Status: AC
  Filled 2015-10-12: qty 2

## 2015-10-12 MED ORDER — SCOPOLAMINE 1 MG/3DAYS TD PT72: 1.0000 | MEDICATED_PATCH | Freq: Once | TRANSDERMAL | Status: DC | PRN

## 2015-10-12 MED ORDER — OXYCODONE HCL 5 MG/5ML PO SOLN: 5.0000 mg | Freq: Once | ORAL | Status: AC | PRN

## 2015-10-12 MED ORDER — GABAPENTIN 300 MG PO CAPS
300.0000 mg | ORAL_CAPSULE | ORAL | Status: AC
  Administered 2015-10-12: 300 mg via ORAL

## 2015-10-12 MED ORDER — MEPERIDINE HCL 25 MG/ML IJ SOLN: 6.2500 mg | INTRAMUSCULAR | Status: DC | PRN

## 2015-10-12 MED ORDER — PROPOFOL 10 MG/ML IV BOLUS
INTRAVENOUS | Status: DC | PRN
  Administered 2015-10-12: 200 mg via INTRAVENOUS
  Administered 2015-10-12: 100 mg via INTRAVENOUS

## 2015-10-12 MED ORDER — ONDANSETRON HCL 4 MG/2ML IJ SOLN
INTRAMUSCULAR | Status: DC | PRN
  Administered 2015-10-12: 4 mg via INTRAVENOUS

## 2015-10-12 MED ORDER — PROPOFOL 10 MG/ML IV BOLUS
INTRAVENOUS | Status: AC
Start: 2015-10-12 — End: 2015-10-12
  Filled 2015-10-12: qty 20

## 2015-10-12 MED ORDER — CIPROFLOXACIN IN D5W 400 MG/200ML IV SOLN: 400.0000 mg | INTRAVENOUS | Status: DC

## 2015-10-12 MED ORDER — PROPOFOL 10 MG/ML IV BOLUS
INTRAVENOUS | Status: AC
  Filled 2015-10-12: qty 20

## 2015-10-12 MED ORDER — GABAPENTIN 300 MG PO CAPS
ORAL_CAPSULE | ORAL | Status: AC
  Filled 2015-10-12: qty 1

## 2015-10-12 MED ORDER — FENTANYL CITRATE (PF) 100 MCG/2ML IJ SOLN
50.0000 ug | INTRAMUSCULAR | Status: AC | PRN
  Administered 2015-10-12: 25 ug via INTRAVENOUS
  Administered 2015-10-12: 50 ug via INTRAVENOUS
  Administered 2015-10-12: 25 ug via INTRAVENOUS
  Administered 2015-10-12 (×3): 50 ug via INTRAVENOUS
  Administered 2015-10-12: 100 ug via INTRAVENOUS
  Administered 2015-10-12: 50 ug via INTRAVENOUS

## 2015-10-12 MED ORDER — MIDAZOLAM HCL 2 MG/2ML IJ SOLN
1.0000 mg | INTRAMUSCULAR | Status: DC | PRN
  Administered 2015-10-12: 2 mg via INTRAVENOUS

## 2015-10-12 MED ORDER — LACTATED RINGERS IV SOLN: INTRAVENOUS | Status: DC

## 2015-10-12 MED ORDER — CELECOXIB 400 MG PO CAPS
400.0000 mg | ORAL_CAPSULE | ORAL | Status: AC
  Administered 2015-10-12: 400 mg via ORAL

## 2015-10-12 SURGICAL SUPPLY — 77 items
APPLIER CLIP 9.375 MED OPEN (MISCELLANEOUS) ×4
APR CLP MED 9.3 20 MLT OPN (MISCELLANEOUS) ×2
BAG DECANTER FOR FLEXI CONT (MISCELLANEOUS) IMPLANT
BANDAGE ACE 6X5 VEL STRL LF (GAUZE/BANDAGES/DRESSINGS) IMPLANT
BINDER BREAST 3XL (BIND) ×4 IMPLANT
BINDER BREAST LRG (GAUZE/BANDAGES/DRESSINGS) IMPLANT
BINDER BREAST MEDIUM (GAUZE/BANDAGES/DRESSINGS) IMPLANT
BINDER BREAST XLRG (GAUZE/BANDAGES/DRESSINGS) IMPLANT
BINDER BREAST XXLRG (GAUZE/BANDAGES/DRESSINGS) IMPLANT
BLADE SURG 10 STRL SS (BLADE) ×24 IMPLANT
BLADE SURG 15 STRL LF DISP TIS (BLADE) IMPLANT
BLADE SURG 15 STRL SS (BLADE)
BNDG GAUZE ELAST 4 BULKY (GAUZE/BANDAGES/DRESSINGS) ×8 IMPLANT
CANISTER SUCT 1200ML W/VALVE (MISCELLANEOUS) ×4 IMPLANT
CHLORAPREP W/TINT 26ML (MISCELLANEOUS) ×8 IMPLANT
CLIP APPLIE 9.375 MED OPEN (MISCELLANEOUS) ×3 IMPLANT
COVER MAYO STAND STRL (DRAPES) ×4 IMPLANT
DECANTER SPIKE VIAL GLASS SM (MISCELLANEOUS) IMPLANT
DRAIN CHANNEL 15F RND FF W/TCR (WOUND CARE) IMPLANT
DRAIN CHANNEL 19F RND (DRAIN) IMPLANT
DRSG PAD ABDOMINAL 8X10 ST (GAUZE/BANDAGES/DRESSINGS) ×16 IMPLANT
DRSG TEGADERM 2-3/8X2-3/4 SM (GAUZE/BANDAGES/DRESSINGS) IMPLANT
ELECT BLADE 4.0 EZ CLEAN MEGAD (MISCELLANEOUS) ×4
ELECT COATED BLADE 2.86 ST (ELECTRODE) ×8 IMPLANT
ELECT REM PT RETURN 9FT ADLT (ELECTROSURGICAL) ×4
ELECTRODE BLDE 4.0 EZ CLN MEGD (MISCELLANEOUS) ×3 IMPLANT
ELECTRODE REM PT RTRN 9FT ADLT (ELECTROSURGICAL) ×3 IMPLANT
EVACUATOR SILICONE 100CC (DRAIN) IMPLANT
GAUZE SPONGE 4X4 12PLY STRL (GAUZE/BANDAGES/DRESSINGS) IMPLANT
GLOVE BIO SURGEON STRL SZ 6 (GLOVE) ×8 IMPLANT
GLOVE BIO SURGEON STRL SZ7.5 (GLOVE) ×4 IMPLANT
GLOVE BIOGEL PI IND STRL 7.0 (GLOVE) ×6 IMPLANT
GLOVE BIOGEL PI IND STRL 7.5 (GLOVE) ×3 IMPLANT
GLOVE BIOGEL PI INDICATOR 7.0 (GLOVE) ×2
GLOVE BIOGEL PI INDICATOR 7.5 (GLOVE) ×1
GLOVE ECLIPSE 6.5 STRL STRAW (GLOVE) ×4 IMPLANT
GOWN STRL REUS W/ TWL LRG LVL3 (GOWN DISPOSABLE) ×6 IMPLANT
GOWN STRL REUS W/ TWL XL LVL3 (GOWN DISPOSABLE) ×3 IMPLANT
GOWN STRL REUS W/TWL LRG LVL3 (GOWN DISPOSABLE) ×2
GOWN STRL REUS W/TWL XL LVL3 (GOWN DISPOSABLE) ×1
IV NS 500ML (IV SOLUTION)
IV NS 500ML BAXH (IV SOLUTION) IMPLANT
KIT FILL SYSTEM UNIVERSAL (SET/KITS/TRAYS/PACK) IMPLANT
KIT MARKER MARGIN INK (KITS) ×4 IMPLANT
LIQUID BAND (GAUZE/BANDAGES/DRESSINGS) ×8 IMPLANT
MARKER SKIN DUAL TIP RULER LAB (MISCELLANEOUS) ×4 IMPLANT
NEEDLE HYPO 25X1 1.5 SAFETY (NEEDLE) ×4 IMPLANT
NS IRRIG 1000ML POUR BTL (IV SOLUTION) ×4 IMPLANT
PACK BASIN DAY SURGERY FS (CUSTOM PROCEDURE TRAY) ×4 IMPLANT
PACK UNIVERSAL I (CUSTOM PROCEDURE TRAY) ×4 IMPLANT
PENCIL BUTTON HOLSTER BLD 10FT (ELECTRODE) ×8 IMPLANT
PIN SAFETY STERILE (MISCELLANEOUS) IMPLANT
SHEET MEDIUM DRAPE 40X70 STRL (DRAPES) ×4 IMPLANT
SLEEVE SCD COMPRESS KNEE MED (MISCELLANEOUS) ×4 IMPLANT
SPONGE GAUZE 4X4 12PLY STER LF (GAUZE/BANDAGES/DRESSINGS) IMPLANT
SPONGE LAP 18X18 X RAY DECT (DISPOSABLE) ×16 IMPLANT
STAPLER VISISTAT 35W (STAPLE) ×12 IMPLANT
STRIP CLOSURE SKIN 1/2X4 (GAUZE/BANDAGES/DRESSINGS) IMPLANT
SUT ETHILON 2 0 FS 18 (SUTURE) IMPLANT
SUT MNCRL AB 4-0 PS2 18 (SUTURE) ×16 IMPLANT
SUT PDS 3-0 CT2 (SUTURE)
SUT PDS AB 2-0 CT2 27 (SUTURE) IMPLANT
SUT PDS II 3-0 CT2 27 ABS (SUTURE) IMPLANT
SUT PROLENE 2 0 CT2 30 (SUTURE) IMPLANT
SUT VIC AB 3-0 PS1 18 (SUTURE) ×10
SUT VIC AB 3-0 PS1 18XBRD (SUTURE) ×15 IMPLANT
SUT VIC AB 3-0 SH 27 (SUTURE) ×1
SUT VIC AB 3-0 SH 27X BRD (SUTURE) ×3 IMPLANT
SUT VICRYL 4-0 PS2 18IN ABS (SUTURE) ×16 IMPLANT
SYR 50ML LL SCALE MARK (SYRINGE) IMPLANT
SYR BULB IRRIGATION 50ML (SYRINGE) ×4 IMPLANT
SYR CONTROL 10ML LL (SYRINGE) ×4 IMPLANT
TAPE MEASURE VINYL STERILE (MISCELLANEOUS) IMPLANT
TOWEL OR 17X24 6PK STRL BLUE (TOWEL DISPOSABLE) ×12 IMPLANT
TUBE CONNECTING 20X1/4 (TUBING) ×4 IMPLANT
UNDERPAD 30X30 (UNDERPADS AND DIAPERS) ×8 IMPLANT
YANKAUER SUCT BULB TIP NO VENT (SUCTIONS) ×4 IMPLANT

## 2015-10-12 NOTE — H&P (Signed)
Stacey Roberts is an 50 y.o. female.   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 She also some lymph node biopsy. She is scheduled for on the plastic reduction. Final pathology showed invasive cancer with a negative lymph node. The posterior margin was focally positive but was close in multiple areas as well as the lateral margin. She now presents for reresection prior to her hypoplastic reduction. She is without complaints.  Past Medical History:  Diagnosis Date  . Allergy    seasonal  . Bronchitis    hx of  . Diabetes mellitus, type II ([place])   . GERD (gastroesophageal reflux disease)   . Headache   . Hyperlipidemia   . Hypertension   . Hypothyroidism   . Obesity   . Pneumonia    as a child  . Sleep apnea 2008   severe OSA-does not use a cpap  . Snoring   . 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 Buddy Duty    Past Surgical History:  Procedure Laterality Date  . BREAST BIOPSY  2000   s/p  . BREAST BIOPSY Left 01/22/2013   Procedure: RE-EXCISION LEFT BREAST DCIS;  Surgeon: Harl Bowie, MD;  Location: [facility];  Service: General;  Laterality: Left;  . BREAST LUMPECTOMY Left 01/06/2013   Procedure: LUMPECTOMY;  Surgeon: Harl Bowie, MD;  Location: [facility];  Service: General;  Laterality: Left;  . BREAST LUMPECTOMY WITH NEEDLE LOCALIZATION AND AXILLARY SENTINEL LYMPH NODE BX Left 09/30/2015   Procedure: Left BREAST LUMPECTOMY WITH NEEDLE LOCALIZATION AND AXILLARY SENTINEL LYMPH NODE BX;  Surgeon: Coralie Keens, MD;  Location: [facility];  Service: General;  Laterality: Left;  . DILATION AND CURETTAGE OF UTERUS  2004   s/p  . THYROIDECTOMY  02/2009   Dr Harlow Asa  . TONSILLECTOMY  1982    Family History  Problem Relation Age of Onset  . Dementia Father     deceased age 9  secondary to dementia  . Bipolar disorder Father   . Emphysema Father   . Heart disease Father   . COPD Father     smoker  . CAD Mother 55    Died age 12 of CAD  . Heart disease Mother   . Graves' disease Mother   . CAD Maternal Grandfather 60  . Heart disease Maternal Grandfather   . Other Sister 48    hysterectomy for fibroids  . Prostate cancer Brother     low grade; w/ surveillance  . Thyroid nodules Brother 4  . Fibroids Other     niece dx approx 60  . Kidney cancer Cousin     maternal 1st cousin dx 64-47; former smoker  . Lung cancer Other     maternal great aunt (MGM's sister); not a smoker  . Colon cancer Neg Hx   . Colon polyps Neg Hx    Social History:  reports that she quit smoking about 8 years ago. Her smoking use included Cigarettes. She smoked 0.50 packs per day. She has never used smokeless tobacco. She reports that she drinks alcohol. She reports that she does not use drugs.  Allergies:  Allergies  Allergen Reactions  . Penicillins Shortness Of Breath and Rash    Has patient had a PCN reaction causing immediate rash, facial/tongue/throat swelling, SOB or lightheadedness with hypotension: Yes  Has patient had a PCN reaction causing severe rash involving mucus membranes or skin necrosis: Yes Has patient had a PCN reaction that required hospitalization No Has patient had a PCN reaction occurring within the last 10 years: Yes If all of the above answers are "NO", then may proceed with Cephalosporin use.   . Clindamycin/Lincomycin Other (See Comments)    Severe stomach cramps  . Ace Inhibitors Cough    REACTION: cough; denies airway involvement     No prescriptions prior to admission.    No results found for this or any previous visit (from the past 48 hour(s)). No results found.  Review of Systems  All other systems reviewed and are negative.   Height 5\' 4"  (1.626 m), weight 116.1 kg (256 lb). Physical Exam  Constitutional: She appears  well-developed and well-nourished. No distress.  HENT:  Head: Normocephalic and atraumatic.  Right Ear: External ear normal.  Left Ear: External ear normal.  Nose: Nose normal.  Mouth/Throat: No oropharyngeal exudate.  Eyes: Pupils are equal, round, and reactive to light. Right eye exhibits no discharge. Left eye exhibits no discharge.  Neck: Normal range of motion.  Cardiovascular: Normal rate, regular rhythm and normal heart sounds.   Respiratory: Breath sounds normal. No respiratory distress. She has no wheezes.  GI: Soft. Bowel sounds are normal. She exhibits no distension. There is no tenderness.  Neurological: She is alert.  Skin: Skin is warm and dry.  Psychiatric: Her behavior is normal.   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 At that time, I will resect more margin of the left breast. I discussed this with her in detail including the risks of having for the positive margins. She understands was to proceed with surgery  Darriana Deboy A, MD 10/12/2015, 7:06 AM

## 2015-10-12 NOTE — Discharge Instructions (Signed)
Post Anesthesia Home Care Instructions  Activity: Get plenty of rest for the remainder of the day. A responsible adult should stay with you for 24 hours following the procedure.  For the next 24 hours, DO NOT: -Drive a car -Paediatric nurse -Drink alcoholic beverages -Take any medication unless instructed by your physician -Make any legal decisions or sign important papers.  Meals: Start with liquid foods such as gelatin or soup. Progress to regular foods as tolerated. Avoid greasy, spicy, heavy foods. If nausea and/or vomiting occur, drink only clear liquids until the nausea and/or vomiting subsides. Call your physician if vomiting continues.  Special Instructions/Symptoms: Your throat may feel dry or sore from the anesthesia or the breathing tube placed in your throat during surgery. If this causes discomfort, gargle with warm salt water. The discomfort should disappear within 24 hours.  If you had a scopolamine patch placed behind your ear for the management of post- operative nausea and/or vomiting:  1. The medication in the patch is effective for 72 hours, after which it should be removed.  Wrap patch in a tissue and discard in the trash. Wash hands thoroughly with soap and water. 2. You may remove the patch earlier than 72 hours if you experience unpleasant side effects which may include dry mouth, dizziness or visual disturbances. 3. Avoid touching the patch. Wash your hands with soap and water after contact with the patch.    Information for Discharge Teaching: EXPAREL (bupivacaine liposome injectable suspension)   Your surgeon gave you EXPAREL(bupivacaine) in your surgical incision to help control your pain after surgery.   EXPAREL is a local anesthetic that provides pain relief by numbing the tissue around the surgical site.  EXPAREL is designed to release pain medication over time and can control pain for up to 72 hours.  Depending on how you respond to EXPAREL, you may  require less pain medication during your recovery.  Possible side effects:  Temporary loss of sensation or ability to move in the area where bupivacaine was injected.  Nausea, vomiting, constipation  Rarely, numbness and tingling in your mouth or lips, lightheadedness, or anxiety may occur.  Call your doctor right away if you think you may be experiencing any of these sensations, or if you have other questions regarding possible side effects.  Follow all other discharge instructions given to you by your surgeon or nurse. Eat a healthy diet and drink plenty of water or other fluids.  If you return to the hospital for any reason within 96 hours following the administration of EXPAREL, please inform your health care providers.  Call your surgeon if you experience:   1.  Fever over 101.0. 2.  Inability to urinate. 3.  Nausea and/or vomiting. 4.  Extreme swelling or bruising at the surgical site. 5.  Continued bleeding from the incision. 6.  Increased pain, redness or drainage from the incision. 7.  Problems related to your pain medication. 8.  Any problems and/or concerns  About my Jackson-Pratt Bulb Drain  What is a Jackson-Pratt bulb? A Jackson-Pratt is a soft, round device used to collect drainage. It is connected to a long, thin drainage catheter, which is held in place by one or two small stiches near your surgical incision site. When the bulb is squeezed, it forms a vacuum, forcing the drainage to empty into the bulb.  Emptying the Jackson-Pratt bulb- To empty the bulb: 1. Release the plug on the top of the bulb. 2. Pour the bulb's contents into a  measuring container which your nurse will provide. 3. Record the time emptied and amount of drainage. Empty the drain(s) as often as your     doctor or nurse recommends.  Date                  Time                    Amount (Drain 1)                 Amount (Drain  2)  _____________________________________________________________________  _____________________________________________________________________  _____________________________________________________________________  _____________________________________________________________________  _____________________________________________________________________  _____________________________________________________________________  _____________________________________________________________________  _____________________________________________________________________  Squeezing the Jackson-Pratt Bulb- To squeeze the bulb: 1. Make sure the plug at the top of the bulb is open. 2. Squeeze the bulb tightly in your fist. You will hear air squeezing from the bulb. 3. Replace the plug while the bulb is squeezed. 4. Use a safety pin to attach the bulb to your clothing. This will keep the catheter from     pulling at the bulb insertion site.  When to call your doctor- Call your doctor if:  Drain site becomes red, swollen or hot.  You have a fever greater than 101 degrees F.  There is oozing at the drain site.  Drain falls out (apply a guaze bandage over the drain hole and secure it with tape).  Drainage increases daily not related to activity patterns. (You will usually have more drainage when you are active than when you are resting.)  Drainage has a bad odor.

## 2015-10-12 NOTE — Anesthesia Procedure Notes (Signed)
Performed by: Tymon Nemetz T       

## 2015-10-12 NOTE — Interval H&P Note (Signed)
History and Physical Interval Note:  10/12/2015 9:21 AM  Stacey Roberts  has presented today for surgery, with the diagnosis of DCIS left breast macromastia asymmetry breast aquired  The various methods of treatment have been discussed with the patient and family. After consideration of risks, benefits and other options for treatment, the patient has consented to  Procedure(s): BREAST oncoplastic RECONSTRUCTION (Bilateral) MAMMARY REDUCTION  (BREAST) possible (Bilateral) MASTOPEXY, possible (Bilateral) Re-EXCISION OF BREAST (Left) as a surgical intervention .  The patient's history has been reviewed, patient examined, no change in status, stable for surgery.  I have reviewed the patient's chart and labs.  Questions were answered to the patient's satisfaction.     [name]

## 2015-10-12 NOTE — Anesthesia Postprocedure Evaluation (Signed)
Anesthesia Post Note  Patient: Stacey Roberts  Procedure(s) Performed: Procedure(s) (LRB): BREAST oncoplastic RECONSTRUCTION (Bilateral) MAMMARY REDUCTION  (BREAST) (Bilateral) Re-EXCISION OF BREAST (Left)  Patient location during evaluation: PACU Anesthesia Type: General Level of consciousness: awake and alert Pain management: pain level controlled Vital Signs Assessment: post-procedure vital signs reviewed and stable Respiratory status: spontaneous breathing, nonlabored ventilation and respiratory function stable Cardiovascular status: blood pressure returned to baseline and stable Postop Assessment: no signs of nausea or vomiting Anesthetic complications: no    Last Vitals:  Vitals:   10/12/15 1613 10/12/15 1637  BP:  136/70  Pulse: 84 86  Resp: 14 16  Temp:  36.9 C    Last Pain:  Vitals:   10/12/15 1637  TempSrc: Oral  PainSc: 4                  Maryuri Warnke A

## 2015-10-12 NOTE — Op Note (Signed)
   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

## 2015-10-12 NOTE — Anesthesia Preprocedure Evaluation (Addendum)
Anesthesia Evaluation  Patient identified by MRN, date of birth, ID band Patient awake    Reviewed: Allergy & Precautions, NPO status , Patient's Chart, lab work & pertinent test results  Airway Mallampati: I  TM Distance: >3 FB Neck ROM: Full    Dental  (+) Teeth Intact, Dental Advisory Given,    Pulmonary sleep apnea , former smoker,    breath sounds clear to auscultation       Cardiovascular hypertension, Pt. on medications  Rhythm:Regular Rate:Normal     Neuro/Psych  Headaches, PSYCHIATRIC DISORDERS    GI/Hepatic GERD  Medicated,  Endo/Other  diabetes, Type 2, Oral Hypoglycemic Agents, Insulin DependentHypothyroidism   Renal/GU   negative genitourinary   Musculoskeletal negative musculoskeletal ROS (+)   Abdominal (+) + obese,  Abdomen: soft.    Peds negative pediatric ROS (+)  Hematology negative hematology ROS (+)   Anesthesia Other Findings   Reproductive/Obstetrics negative OB ROS                           Lab Results  Component Value Date   WBC 9.4 09/30/2015   HGB 13.8 09/30/2015   HCT 42.9 09/30/2015   MCV 85.0 09/30/2015   PLT 288 09/30/2015   Lab Results  Component Value Date   CREATININE 0.48 09/30/2015   BUN 11 09/30/2015   NA 138 09/30/2015   K 4.2 09/30/2015   CL 105 09/30/2015   CO2 24 09/30/2015   Lab Results  Component Value Date   INR 0.87 03/15/2009    09/2015 EKG: normal sinus rhythm.   Anesthesia Physical Anesthesia Plan  ASA: III  Anesthesia Plan: General   Post-op Pain Management:    Induction: Intravenous  Airway Management Planned: Oral ETT  Additional Equipment:   Intra-op Plan:   Post-operative Plan: Extubation in OR  Informed Consent: I have reviewed the patients History and Physical, chart, labs and discussed the procedure including the risks, benefits and alternatives for the proposed anesthesia with the patient or  authorized representative who has indicated his/her understanding and acceptance.   Dental advisory given  Plan Discussed with: CRNA  Anesthesia Plan Comments:         Anesthesia Quick Evaluation

## 2015-10-12 NOTE — Transfer of Care (Signed)
Immediate Anesthesia Transfer of Care Note  Patient: Stacey Roberts  Procedure(s) Performed: Procedure(s): BREAST oncoplastic RECONSTRUCTION (Bilateral) MAMMARY REDUCTION  (BREAST) (Bilateral) Re-EXCISION OF BREAST (Left)  Patient Location: PACU  Anesthesia Type:General  Level of Consciousness: awake and oriented  Airway & Oxygen Therapy: Patient Spontanous Breathing and Patient connected to face mask oxygen  Post-op Assessment: Report given to RN  Post vital signs: Reviewed and stable  Last Vitals: 133/81, 96, 16, 99%, 98.5 Vitals:   10/12/15 0946  BP: (!) 144/74  Pulse: 81  Resp: 18  Temp: 36.6 C    Last Pain:  Vitals:   10/12/15 0946  TempSrc: Oral  PainSc: 3       Patients Stated Pain Goal: 1 ([id number])  Complications: No apparent anesthesia complications

## 2015-10-12 NOTE — Anesthesia Procedure Notes (Signed)
Procedure Name: Intubation Date/Time: 10/12/2015 10:40 AM Performed by: Suella Broad D Pre-anesthesia Checklist: Patient identified, Emergency Drugs available, Suction available and Patient being monitored Patient Re-evaluated:Patient Re-evaluated prior to inductionOxygen Delivery Method: Circle system utilized Preoxygenation: Pre-oxygenation with 100% oxygen Intubation Type: IV induction Ventilation: Mask ventilation without difficulty Laryngoscope Size: Mac and 3 Grade View: Grade I Tube type: Oral Tube size: 7.0 mm Number of attempts: 1 Airway Equipment and Method: Stylet Placement Confirmation: ETT inserted through vocal cords under direct vision,  positive ETCO2,  CO2 detector and breath sounds checked- equal and bilateral Secured at: 22 cm Tube secured with: Tape Dental Injury: Teeth and Oropharynx as per pre-operative assessment

## 2015-10-12 NOTE — Op Note (Signed)
Operative Note   DATE OF OPERATION: 9.17.17  LOCATION: [facility]-outpatient  SURGICAL DIVISION: Plastic Surgery  PREOPERATIVE DIAGNOSES:  1. Macromastia  2. Left breast cancer 3. S/p left lumpectomy  POSTOPERATIVE DIAGNOSES:  same  PROCEDURE:  Bilateral oncoplastic breast reconstruction with breast reduction  SURGEON: Irene Limbo MD MBA  ASSISTANT: none  ANESTHESIA:  General.   EBL: [id number]  COMPLICATIONS: None immediate.   INDICATIONS FOR PROCEDURE:  The patient, Stacey Roberts, is a 50 y.o. female born on 1965/10/04, is here for oncoplastic reduction following lumpectomy and SLN sampling. She has positive margin posteriorly and will undergo reexcision left breast margin with Dr. Ninfa Linden.    FINDINGS: Left breast reduction 315 g, left lumpectomy reexcision 48 g, right breast reduction 566 g. Of note patient has had prior lumpectomy of left via periareolar incision.   DESCRIPTION OF PROCEDURE:  The patient was marked standing in the preoperative area to mark sternal notch, chest midline, anterior axillary lines, inframammary folds. The location of new nipple areolar complex was marked at level of on iframammary fold on anterior surface breast by palpation. This was marked symmetric over bilateral breasts. With aid of Wise pattern marker, location of new nipple areolar complex and vertical limbs (8 cm) were marked. The patient was taken to the operating room. SCDs were placed and IV antibiotics were given. The patient's operative site was prepped and draped in a sterile fashion. A time out was performed and all information was confirmed to be correct. See Dr. Ninfa Linden note for description of reexcision left breast.  Over left breast, superomedial pedicle marked and nipple areolar complex marked with 50 mm diameter marker. Pedicle deepithlialized. The lumpectomy cavity extended in the central breast and pedicle was developed inferiorly only. Dissection completed toward chest  wall until tension free rotation of pedicle achieved.  Inferior pole breast tissue excised.Inferiorly based flaps of breast tissue preserved and advanced into lumpectomy defect over central breast and upper outer quadrant breast. These were secured to chest wall with interrupted 3-0 vicryl. Breast tailor tacked closed.  I then directed attention to right breast where superomedial pedicle designed. The pedicle was deepithelialized and developed to chest wall. Inferior pole tissue excised. Additional tissue of superior pole excised. Skin and soft tissue flaps developed until able to be redraped over pedicle without tension. Patient brought to upright sitting position and assessed for symmetry. Patent returned to supine position. Breast cavities irrigated and hemostasis obtained. Exparel infiltrated throughout both breast cavities. 15 Fr JP placed in each breast and secured with 2-0 nylon. Closure completed with 3-0 vicryl to approximate dermis along inframammary fold and vertical limb. NAC inset with 4-0 vicryl in dermis.Skin closure completed with 4-0 monocryl subcuticular throughout.Tissue adhesive applied.  Dry dressing and breast binder applied. The patient was allowed to wake from anesthesia, extubated and taken to the recovery room in satisfactory condition.  SPECIMENS: Right and left breast reduction  DRAINS: 15 Fr JP in right and left breast  Irene Limbo, MD [facility] [phone], pin [phone]

## 2015-10-13 ENCOUNTER — Encounter: Payer: 59 | Admitting: Genetic Counselor

## 2015-10-13 ENCOUNTER — Other Ambulatory Visit: Payer: 59

## 2015-10-13 ENCOUNTER — Encounter (HOSPITAL_BASED_OUTPATIENT_CLINIC_OR_DEPARTMENT_OTHER): Payer: Self-pay | Admitting: Plastic Surgery

## 2015-10-13 NOTE — Op Note (Signed)
NAMECORRIN, HAYHURST NO.:  [id number]  MEDICAL RECORD NO.:  [id number]  LOCATION:                                 FACILITY:  PHYSICIAN:  Coralie Keens, M.D. 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  PROCEDURE:  Re-excision of left breast posterior and lateral margin.  SURGEON:  Coralie Keens, M.D.  ASSISTANT:  Irene Limbo, MD.  ANESTHESIA:  General endotracheal anesthesia.  ESTIMATED BLOOD LOSS:  Minimal.  INDICATIONS:  This is a 50 year old female, who had presented with recurrent DCIS of the left breast.  She underwent a wide lumpectomy of the left breast as well as sentinel lymph node biopsy.  The final pathology unfortunately also showed invasive cancer.  The posterior margin was focally positive with close margins posterior and laterally. Decision was made to proceed with re-excision.  At the same time, Dr. Iran Planas will be doing oncoplastic reconstruction of the left breast with reduction of the right breast.  PROCEDURE IN DETAIL:  The patient was brought to the operating room, identified as Rhys Martini.  She was placed supine on the operating table and general anesthesia was induced.  Her breast was then prepped and draped in the usual sterile fashion.  I opened up the previous scar over the left breast with a scalpel.  I then suctioned out the seroma from the lumpectomy site.  I then was able to easily identify the margins with the previous placed surgical clip.  I started medially and excised the entire posterior margin and the lateral margin going down to the chest wall with the electrocautery.  Once it was removed, I marked the  posterior margin and lateral margin with marker paint.  There was a separate piece of specimen that came off that was at the 12 o'clock position of the lateral margin.  This was all sent together.  Once the specimen was removed, I examined the lumpectomy site.  Hemostasis felt to be achieved.  I then placed surgical clips around the lumpectomy site.  At this point, Dr. Iran Planas presented and will dictate the rest of the procedure with the oncoplastic reconstruction and reduction.     Coralie Keens, M.D.   ______________________________ Coralie Keens, M.D.    DB/MEDQ  D:  10/12/2015  T:  10/13/2015  Job:  [id number]

## 2015-10-15 DIAGNOSIS — C50112 Malignant neoplasm of central portion of left female breast: Secondary | ICD-10-CM | POA: Diagnosis not present

## 2015-10-19 ENCOUNTER — Telehealth: Payer: Self-pay | Admitting: *Deleted

## 2015-10-19 NOTE — Telephone Encounter (Signed)
Received oncotype score of 3/4%. Pt notified of results. Discussed next step is xrt. Denies needs. Physician team notified.

## 2015-10-25 NOTE — Progress Notes (Addendum)
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    10/12/15 Diagnosis 1. Breast, excision, Left re-excision new posterior lateral margin 48.5gms - BENIGN BREAST PARENCHYMA SHOWING EXTENSIVE FAT NECROSIS. - FIBROCYSTIC CHANGES WITH ASSOCIATED CALCIFICATION. - NO ATYPIA OR TUMOR SEEN. 2. Breast, Mammoplasty, Left, 314.9 grams - BENIGN FIBROFATTY SOFT TISSUE. - NO ATYPIA OR TUMOR SEEN. 3. Breast, Mammoplasty, Right, 565.9 grams - BENIGN BREAST PARENCHYMA SHOWING FIBROCYSTIC CHANGES WITH FOCAL USUAL DUCTAL HYPERPLASIA. - NO ATYPIA OR TUMOR SEEN.  09/30/15 Diagnosis 1. Breast, lumpectomy, Left - INVASIVE DUCTAL CARCINOMA WITH CALCIFICATIONS, GRADE I/III, SPANNING 2.3 CM. - DUCTAL CARCINOMA IN SITU WITH CALCIFICATIONS, LOW GRADE. - LOBULAR NEOPLASIA (ATYPICAL LOBULAR HYPERPLASIA). - DUCTAL CARCINOMA IN SITU IS FOCALLY PRESENT AT THE POSTERIOR MARGIN AND BROADLY LESS THAN 0.1 CM TO THE SAME POSTERIOR MARGIN; DUCTAL CARCINOMA IN SITU IS BROADLY LESS THAN 0.1 CM TO THE LATERAL MARGIN. - INVASIVE CARCINOMA IS BROADLY LESS THAN 0.1 CM TO THE POSTERIOR MARGIN. - SEE ONCOLOGY TABLE BELOW. 2. Lymph node, sentinel, biopsy, Left - THERE IS NO EVIDENCE OF CARCINOMA IN 1 OF 1 LYMPH NODE (0/1). Breast, left, needle core biopsy, UOQ retroareolar - FIBROADIPOSE TISSUE WITH FAT NECROSIS AND FOREIGN BODY GIANT CELL REACTION. - THERE IS NO EVIDENCE OF MALIGNANCY. - SEE COMMENT.  09/02/15 Diagnosis Breast, left, needle core biopsy, UOQ DUCTAL CARCINOMA IN SITU, GRADE 1 FOCAL INVASION CAN NOT BE EXCLUDED STROMAL MUCIN WITH CALCIFICATION  Histology per Pathology Report 01/06/13 Diagnosis Breast, lumpectomy, Left - LOW GRADE DUCTAL CARCINOMA IN SITU INVOLVING AN INTRADUCTAL PAPILLOMA. - INKED SURGICAL MARGIN IS FOCALLY POSITIVE. - SEE ONCOLOGY TEMPLATE. 1  of 01/22/13 Diagnosis 1. Breast, biopsy, Left - FIBROCYSTIC CHANGES WITH FOCAL INFLAMMATION AND FIBROSIS. - NO EVIDENCE OF MALIGNANCY. 2. Breast, excision, Left posterior margin - FOCAL DUCTAL CARCINOMA IN SITU, FOCALLY 0.1 CM FROM THE MARGIN.  Receptor Status: ER(60%), PR (80%), Ki67 (3%)  Did patient present with symptoms (if so, please note symptoms) or was this found on screening mammography?: patient concerned of spontaneous left bloody nipple discharge MM on 12/24/12 revealed no evidence of malignancy. Ductogram performed on left breast on 12/24/12.  Past/Anticipated interventions by surgeon, if any: Left breast lumpectomy with low grade ductal carcinoma in situ involving an intraductal papilloma on 01/06/2013. 09/30/15 - Procedure: Left BREAST LUMPECTOMY WITH NEEDLE LOCALIZATION AND AXILLARY SENTINEL LYMPH NODE BX;  Surgeon: Coralie Keens, MD, 10/12/15 - Procedure: Re-EXCISION OF BREAST;  Surgeon: Coralie Keens, MD;  Location: [facility], Procedure: MAMMARY REDUCTION  (BREAST);  Surgeon: Irene Limbo, MD, Procedure: BREAST oncoplastic RECONSTRUCTION;  Surgeon: Irene Limbo, MD;  Location: [facility]  Past/Anticipated interventions by medical oncology, if any: The patient refused adjuvant radiation and did not tolerate tamoxifen, which she took for approximately 2 months.   Lymphedema issues, if any: No  Pain issues, if any: yes has pain in her left breast at a 4/10.  She takes percocet at night.  SAFETY ISSUES:  Prior radiation? No  Pacemaker/ICD? 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  BP 125/88 (BP Location: Right Wrist, Patient Position: Sitting)   Pulse 78   Temp 98 F (36.7 C) (Oral)   Ht '5\' 4"'  (1.626 m)   Wt  247 lb (112 kg)   SpO2 98%   BMI 42.40 kg/m    Wt Readings from Last 3  Encounters:  10/27/15 247 lb (112 kg)  10/12/15 255 lb (115.7 kg)  10/08/15 256 lb (116.1 kg)

## 2015-10-27 ENCOUNTER — Ambulatory Visit
Admission: RE | Admit: 2015-10-27 | Discharge: 2015-10-27 | Disposition: A | Discharge status: Home / Self Care | Payer: 59 | Source: Ambulatory Visit | Attending: Radiation Oncology | Admitting: Radiation Oncology

## 2015-10-27 DIAGNOSIS — C50112 Malignant neoplasm of central portion of left female breast: Secondary | ICD-10-CM

## 2015-10-27 DIAGNOSIS — Z510 Encounter for antineoplastic radiation therapy: Secondary | ICD-10-CM | POA: Diagnosis not present

## 2015-10-27 DIAGNOSIS — Z170 Estrogen receptor positive status [ER+]: Principal | ICD-10-CM

## 2015-10-27 DIAGNOSIS — Z880 Allergy status to penicillin: Secondary | ICD-10-CM | POA: Diagnosis not present

## 2015-10-27 NOTE — Progress Notes (Signed)
Please see the Nurse Progress Note in the MD Initial Consult Encounter for this patient. 

## 2015-10-27 NOTE — Progress Notes (Signed)
Radiation Oncology         [phone] ________________________________  Name: Stacey Roberts MRN: [id number]  Date: 10/27/2015  DOB: 03/19/1965  Re-evaluation Visit Note  CC: [name], NP  Coralie Keens, MD    ICD-9-CM ICD-10-CM   1. Malignant neoplasm of central portion of left breast in female, estrogen receptor positive ([place]) 174.1 C50.112    V86.0 Z17.0     Diagnosis:   Stage IIA (pT2, pN0), grade 1, invasive ductal carcinoma of the left breast (ER/PR positive, HER2 ?)  Interval Since Last Radiation: n/a  Narrative:  The patient returns today for routine re-evaluation.  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 She developed spontaneous left bloody nipple discharge in December 2014. A mammogram was performed and negative for malignancy. A ductogram was performed which showed filling defect. She underwent a left lumpectomy, by Dr. Ninfa Linden, for this on 01/06/2013 revealing low-grade DCIS involving an intraductal papilloma. The inked surgical margin was focally positive and the tumor size was 1.5 cm. She then underwent re-excision on 01/22/2013 which which showed fibrocystic change, but also another area of focal ductal carcinoma in situ with 0.1 cm from the posterior margin. All in all, the initial specimen was 10.3 x 7.5 x 3.4 cm. The re-excision specimens were 2.3 x 1.6 x 0.5 cm and 7.8 x 6.5 x 2.1 cm. The patient refused adjuvant radiation and did not tolerate Tamoxifen for which she took for approximately 2 months. She was last seen by medical oncology in 2015.  Since then, the patient has continued with yearly mammography. Mammogram on 02/15/15, breast density category A, was negative. However, in early August 2017 she developed left nipple discharge and sometimes this was bloody. Repeat left mammography with tomography and left breast ultrasonography on 08/30/2015 was now breast density category B. Aside  some two medial left breast masses and postlumpectomy findings, none of which showed any change since 2012, there was a group of new pleomorphic calcifications posterior to the lumpectomy area spanning 1.4 cm. On physical exam, no palpable masses were appreciated. Ultrasound showed scarring behind the nipple and no definite mass.  Biopsy of the calcifications in the UOQ of the left breast on 09/02/2015 revealed grade 1 DCIS, focal invasion could not be excluded, stromal mucin with calcification, and there was not enough tissue for a prognostic panel.  Her case was presented in the multidisciplinary breast cancer conference on 09/08/2015. At that time a preliminary plan was proposed: MRI, then if possible repeat lumpectomy with sentinel lymph node sampling, followed by radiation and anti-estrogens. Genetics was also suggested.  On 09/10/2015, the patient underwent bilateral breast MRI with and without contrast. This again found the breast density to be category B. There was an area of non-masslike enhancement involving the UOQ of the left breast from the nipple posteriorly extending approximately 10 cm. The right breast was unremarkable. There was no evidence of adenopathy.  MRI guided biopsy of the upper-outer quadrant retroareolar area of the left breast on 09/16/2015 showed fat necrosis and foreign body giant cell reaction, but no evidence of malignancy.  The patient returned to Dr. Jana Hakim on 09/24/15 who discussed treatment options. Genetics was sent on 09/14/15 and if she did carry a deleterious gene, she would want bilateral mastectomies.  On 09/30/15, the patient had a left lumpectomy revealing grade 1 invasive ductal carcinoma (spanning 2.3 cm) (pT2, pN0) (ER 60% positive, PR 80% positive, HER2 ?,  Ki67 3%), low grade DCIS, calcifications, and lobular neoplasia. The DCIS was focally present at the posterior margin and broadly less than 0.1 cm to the same posterior margin. The DCIS was also  broadly less than 0.1 cm to the lateral margin. The invasive carcinoma was broadly less than 0.1 cm to the posterior margin. Biopsy of a left axillary sentinel lymph node was negative.      did not tolerate tamoxifen, which she took for approximately 2 months. She denies lymphedema issues. She notes 4/10 left breast pain. She takes percocet at night.                         ALLERGIES:  is allergic to penicillins; clindamycin/lincomycin; and ace inhibitors.  Meds: Current Outpatient Prescriptions  Medication Sig Dispense Refill  . canagliflozin (INVOKANA) 300 MG TABS tablet Take 1 tablet (300 mg total) by mouth daily. 30 tablet 2  . exenatide (BYETTA 10 MCG PEN) 10 MCG/0.04ML SOPN injection Inject 0.04 mLs (10 mcg total) into the skin 2 (two) times daily with a meal. And pen needles 2/day 2.4 mL 11  . Insulin Glargine (LANTUS SOLOSTAR) 100 UNIT/ML Solostar Pen Inject 60 Units into the skin every morning. And pen needles 1/day 10 pen 11  . levothyroxine (SYNTHROID, LEVOTHROID) 150 MCG tablet TAKE 1 TABLET BY MOUTH DAILY BEFORE BREAKFAST 30 tablet 3  . LYRICA 75 MG capsule TAKE 1 CAPSULE BY MOUTH TWICE A DAY 60 capsule 0  . metFORMIN (GLUCOPHAGE) 1000 MG tablet TAKE 1 TABLET BY MOUTH 2 TIMES DAILY WITH A MEAL. 60 tablet 6  . Multiple Vitamin (MULTIVITAMIN WITH MINERALS) TABS tablet Take 1 tablet by mouth daily.    Marland Kitchen olmesartan (BENICAR) 20 MG tablet TAKE 1 TABLET BY MOUTH DAILY 30 tablet 10  . oxyCODONE-acetaminophen (ROXICET) 5-325 MG tablet Take 1-2 tablets by mouth every 4 (four) hours as needed for severe pain. 50 tablet 0  . pantoprazole (PROTONIX) 40 MG tablet TAKE 1 TABLET BY MOUTH ONCE DAILY 90 tablet 2  . rosuvastatin (CRESTOR) 10 MG tablet Take 1 tablet (10 mg total) by mouth daily. 90 tablet 3  . zinc sulfate 220 (50 Zn) MG capsule Take 220 mg by mouth.     No current facility-administered medications for this encounter.     Physical Findings: The patient is in no acute distress.  Patient is alert and oriented.  height is '5\' 4"'  (1.626 m) and weight is 247 lb (112 kg). Her oral temperature is 98 F (36.7 C). Her blood pressure is 125/88 and her pulse is 78. Her oxygen saturation is 98%. .  No significant changes. General: Alert and oriented, in no acute distress HEENT: Head is normocephalic. Extraocular movements are intact. Oropharynx is clear. Neck: Neck is supple, no palpable cervical or supraclavicular lymphadenopathy. Heart: Regular in rate and rhythm with no murmurs, rubs, or gallops. Chest: Clear to auscultation bilaterally, with no rhonchi, wheezes, or rales. Psychiatric: Judgment and insight are intact. Affect is appropriate. Breast: Right breast has scars from her reduction mammoplasty. Healing well without signs of drainage or infection. Left breast has scars from reduction mammaplasty. Healing well with no signs of  drainage or infection.   Lab Findings: Lab Results  Component Value Date   WBC 9.4 09/30/2015   HGB 13.8 09/30/2015   HCT 42.9 09/30/2015   MCV 85.0 09/30/2015   PLT 288 09/30/2015    Radiographic Findings: Nm Sentinel Node Inj-no Rpt (breast)  Result Date:  09/30/2015 CLINICAL DATA: left breast dcis Sulfur colloid was injected intradermally by the nuclear medicine technologist for breast cancer sentinel node localization.   Mm Breast Surgical Specimen  Result Date: 09/30/2015 CLINICAL DATA:  Status post left lumpectomy. EXAM: SPECIMEN RADIOGRAPH OF THE LEFT BREAST COMPARISON:  Previous exam(s). FINDINGS: Status post excision of the left breast. The 3 wires, dumbbell-shaped biopsy marker and ribbon shaped biopsy marker are present within the specimen radiograph. The biopsy markers are marked for pathology. IMPRESSION: Specimen radiograph of the left breast. Electronically Signed   By: Pamelia Hoit M.D.   On: 09/30/2015 16:52   Mm Lt Plc Breast Loc Dev   1st Lesion  Inc Mammo Guide  Result Date: 09/30/2015 CLINICAL DATA:  Preoperative bracketed  needle localization. EXAM: NEEDLE LOCALIZATION OF THE LEFT BREAST WITH MAMMO GUIDANCE COMPARISON:  Previous exams. FINDINGS: Patient presents for needle localization prior to left breast lumpectomy. I met with the patient and we discussed the procedure of needle localization including benefits and alternatives. We discussed the high likelihood of a successful procedure. We discussed the risks of the procedure, including infection, bleeding, tissue injury, and further surgery. Informed, written consent was given. The usual time-out protocol was performed immediately prior to the procedure. Using mammographic guidance, sterile technique, 1% lidocaine and a 7 cm modified Kopans needle, a posterior ribbon shaped marker was localized using superior approach. Next, using similar technique and 7 cm modified Kopans needle, a more anteriorly located dumbbell-shaped marker was localized using superior approach. The first wire which was used for this localization displaced during removal of the needle, and because of this, a second 7 cm modified Kopans needle was used to localize this area. The appropriate wire was clearly marked. The images were marked for Dr. Ninfa Linden. IMPRESSION: Needle localization of left breast. No apparent complications. Electronically Signed   By: Fidela Salisbury M.D.   On: 09/30/2015 16:34   Mm Lt Plc Breast Loc Dev   Ea Add Lesion  Inc Mammo Guide  Result Date: 09/30/2015 CLINICAL DATA:  Preoperative bracketed needle localization. EXAM: NEEDLE LOCALIZATION OF THE LEFT BREAST WITH MAMMO GUIDANCE COMPARISON:  Previous exams. FINDINGS: Patient presents for needle localization prior to left breast lumpectomy. I met with the patient and we discussed the procedure of needle localization including benefits and alternatives. We discussed the high likelihood of a successful procedure. We discussed the risks of the procedure, including infection, bleeding, tissue injury, and further surgery. Informed,  written consent was given. The usual time-out protocol was performed immediately prior to the procedure. Using mammographic guidance, sterile technique, 1% lidocaine and a 7 cm modified Kopans needle, a posterior ribbon shaped marker was localized using superior approach. Next, using similar technique and 7 cm modified Kopans needle, a more anteriorly located dumbbell-shaped marker was localized using superior approach. The first wire which was used for this localization displaced during removal of the needle, and because of this, a second 7 cm modified Kopans needle was used to localize this area. The appropriate wire was clearly marked. The images were marked for Dr. Ninfa Linden. IMPRESSION: Needle localization of left breast. No apparent complications. Electronically Signed   By: Fidela Salisbury M.D.   On: 09/30/2015 16:34    Impression: Stage IIA (pT2, pN0), grade 1, invasive ductal carcinoma of the left breast (ER/PR positive, HER2 ?).  Patient would be a good candidate for radiation as part of breast conservation therapy. We discussed the side effects and possible toxicities. Patient appears to understand wishes to  proceed. Reexcision of the lumpectomy site at the time of her reduction mammoplasty showed no residual tumor within the left breast and therefore she will not receive a boost to the lumpectomy cavity. The patient will receive approximately 5 and half weeks of radiation therapy directed at the left breast area. Given her breast size and reconstruction, I would not recommend hypo-fractionated accelerated radiation therapy.  Plan:  Simulation scheduled for October 31st at 2 pm. Radiation will proceed approximately one week after simulation.  ____________________________________  This document serves as a record of services personally performed by Gery Pray, MD. It was created on his behalf by Bethann Humble, a trained medical scribe. The creation of this record is based on the scribe's  personal observations and the provider's statements to them. This document has been checked and approved by the attending provider.

## 2015-11-01 ENCOUNTER — Encounter: Payer: Self-pay | Admitting: Podiatry

## 2015-11-01 ENCOUNTER — Ambulatory Visit (INDEPENDENT_AMBULATORY_CARE_PROVIDER_SITE_OTHER): Payer: 59 | Admitting: Podiatry

## 2015-11-01 ENCOUNTER — Encounter (HOSPITAL_COMMUNITY): Payer: Self-pay

## 2015-11-01 DIAGNOSIS — E1140 Type 2 diabetes mellitus with diabetic neuropathy, unspecified: Secondary | ICD-10-CM

## 2015-11-01 DIAGNOSIS — Z794 Long term (current) use of insulin: Secondary | ICD-10-CM

## 2015-11-01 MED ORDER — PREGABALIN 75 MG PO CAPS
75.0000 mg | ORAL_CAPSULE | Freq: Two times a day (BID) | ORAL | 5 refills | Status: DC
Start: 2015-11-01 — End: 2016-05-05

## 2015-11-02 ENCOUNTER — Other Ambulatory Visit: Payer: Self-pay | Admitting: Endocrinology

## 2015-11-02 NOTE — Progress Notes (Signed)
She presents today for follow-up of her neuropathy. She states the Lyrica seems to work well as long as she takes it as she is supposed to. She supposed to be taking 75 mg twice a day however the last few months she's only been taking it once daily and is not working as well. History of diabetes type 2.  Objective: Vital signs are stable alert and oriented 3 pulses remain palpable no change in neurological symptoms. Muscle strength is normal skin turgor is normal orthopedics no change. Cutaneous evaluation of his wrist never been lesions or wounds.  Assessment: Diabetic peripheral neuropathy.  Plan: I refilled her Lyrica 75 mg 1 by mouth twice daily with a years worth of refills. I will follow up with her in 6 months if necessary. I offered to increase her Lyrica to 100 mg twice daily and we will do so if necessary.

## 2015-11-05 ENCOUNTER — Telehealth: Payer: Self-pay | Admitting: Oncology

## 2015-11-05 NOTE — Telephone Encounter (Addendum)
Stacey Roberts left a message asking how many radiation treatment she will have and if she can put in a request for days and time.  She requested a return call at [phone].  Called Stacey Roberts back and let her know that she will have five and half weeks of radiation treatments per Dr. Clabe Seal note.  Stacey Roberts said she would like the latest in the day appointment if possible.  Advised her that CT SIM will be notified.

## 2015-11-22 ENCOUNTER — Encounter: Payer: Self-pay | Admitting: Genetic Counselor

## 2015-11-22 DIAGNOSIS — Z1379 Encounter for other screening for genetic and chromosomal anomalies: Secondary | ICD-10-CM | POA: Insufficient documentation

## 2015-11-22 NOTE — Progress Notes (Signed)
GENETIC TEST RESULT  HPI: Stacey Roberts was previously seen in the [facility] due to a personal history of breast cancer at 50, personal history of thyroid cancer, family history of prostate and other cancers, and concerns regarding a hereditary predisposition to cancer. Please refer to our prior cancer genetics clinic note from September 14, 2015 for more information regarding Stacey Roberts's medical, social and family histories, and our assessment and recommendations, at the time. Stacey Roberts's recent genetic test results were disclosed to her, as were recommendations warranted by these results. These results and recommendations are discussed in more detail below.  GENETIC TEST RESULTS: At the time of Stacey Roberts's visit on 09/14/15, we recommended she pursue genetic testing of the 42-gene [employer] Common Hereditary Cancers Panel (Breast, Gyn, GI) through [facility]. The 42-gene [employer] Common Hereditary Cancers Panel (Breast, Gyn, GI) performed by [facility], [place]) includes sequencing and/or deletion/duplication analysis for the following genes: APC, ATM, AXIN2, BARD1, BMPR1A, BRCA1, BRCA2, BRIP1, CDH1, CDKN2A, CHEK2, DICER1, EPCAM, GREM1, KIT, MEN1, MLH1, MSH2, MSH6, MUTYH, NBN, NF1, PALB2, PDGFRA, PMS2, POLD1, POLE, PTEN, RAD50, RAD51C, RAD51D, SDHA, SDHB, SDHC, SDHD, SMAD4, SMARCA4, STK11, TP53, TSC1, TSC2, and VHL. Those results are now back, the report date for which is September 21, 2015.  Genetic testing was normal, and did not reveal a deleterious mutation in these genes.  Additionally, no variants of uncertain significance (VUSes) were found.  The test report will be scanned into EPIC and will be located under the Results Review tab in the Pathology>Molecular Pathology section.   We discussed with Stacey Roberts that since the current genetic testing is not perfect, it is possible there may be a gene mutation in one of these genes that current  testing cannot detect, but that chance is small. We also discussed, that it is possible that another gene that has not yet been discovered, or that we have not yet tested, is responsible for the cancer diagnoses in the family, and it is, therefore, important to remain in touch with cancer genetics in the future so that we can continue to offer Stacey Roberts the most up-to-date genetic testing.   CANCER SCREENING RECOMMENDATIONS: While we still do not have an explanation for the personal and familial history of cancer, this result may be reassuring and indicate that Stacey Roberts likely does not have an increased risk for a future cancer due to a mutation in one of these genes. This normal test also suggests that Stacey Roberts's cancer was most likely not due to an inherited predisposition associated with one of these genes.  Most cancers happen by chance and this negative test suggests that her cancer falls into this category.  We, therefore, recommended she continue to follow the cancer management and screening guidelines provided by her oncology and primary healthcare providers.   RECOMMENDATIONS FOR FAMILY MEMBERS: Men and women in this family might be at some increased risk of developing cancer, over the general population risk, simply due to the family history of cancer. We recommended women in this family have a yearly mammogram beginning at age 48, or 38 years younger than the earliest onset of cancer, an annual clinical breast exam, and perform monthly breast self-exams. Stacey Roberts nieces will be eligible to get annual mammograms starting at age 13.  Women in this family should also have a gynecological exam as recommended by their primary provider. All family members should have a colonoscopy by age 14.  Based on Ms.  Roberts family history, we recommended her maternal first cousin, who was diagnosed with kidney cancer at age 93-47, have genetic counseling and testing. Stacey Roberts will let us  know if we can be of any assistance in coordinating genetic counseling and/or testing for this family member.   FOLLOW-UP: Lastly, we discussed with Stacey Roberts that cancer genetics is a rapidly advancing field and it is possible that new genetic tests will be appropriate for her and/or her family members in the future. We encouraged her to remain in contact with cancer genetics on an annual basis so we can update her personal and family histories and let her know of advances in cancer genetics that may benefit this family.   Our contact number was provided. Stacey Roberts's questions were answered to her satisfaction, and she knows she is welcome to call us at anytime with additional questions or concerns.   Stacey Luz, MS, [facility] Certified Genetic Counselor [email or web address] Phone: [phone]

## 2015-11-23 ENCOUNTER — Ambulatory Visit
Admission: RE | Admit: 2015-11-23 | Discharge: 2015-11-23 | Disposition: A | Discharge status: Home / Self Care | Payer: 59 | Source: Ambulatory Visit | Attending: Radiation Oncology | Admitting: Radiation Oncology

## 2015-11-23 DIAGNOSIS — C50112 Malignant neoplasm of central portion of left female breast: Secondary | ICD-10-CM

## 2015-11-23 DIAGNOSIS — Z880 Allergy status to penicillin: Secondary | ICD-10-CM | POA: Diagnosis not present

## 2015-11-23 DIAGNOSIS — Z170 Estrogen receptor positive status [ER+]: Principal | ICD-10-CM

## 2015-11-23 DIAGNOSIS — Z510 Encounter for antineoplastic radiation therapy: Secondary | ICD-10-CM | POA: Diagnosis not present

## 2015-11-23 NOTE — Progress Notes (Addendum)
  Radiation Oncology         [phone] ________________________________  Name: VIRDELL HOILAND MRN: [id number]  Date: 11/23/2015  DOB: 1965/05/10  SIMULATION AND TREATMENT PLANNING NOTE    ICD-9-CM ICD-10-CM   1. Malignant neoplasm of central portion of left breast in female, estrogen receptor positive ([place]) 174.1 C50.112    V86.0 Z17.0     DIAGNOSIS: Stage IIA (pT2, pN0), grade 1, invasive ductal carcinoma of the left breast (ER/PR positive, HER2 ?)  NARRATIVE:  The patient was brought to the [facility].  Identity was confirmed.  All relevant records and images related to the planned course of therapy were reviewed.  The patient freely provided informed written consent to proceed with treatment after reviewing the details related to the planned course of therapy. The consent form was witnessed and verified by the simulation staff.  Then, the patient was set-up in a stable reproducible  supine position for radiation therapy.  CT images were obtained.  Surface markings were placed.  The CT images were loaded into the planning software.  Then the target and avoidance structures were contoured.  Treatment planning then occurred.  The radiation prescription was entered and confirmed.  Then, I designed and supervised the construction of a total of 28 medically necessary complex treatment devices.  I have requested : 3D Simulation  I have requested a DVH of the following structures: heart, lungs, lumpectomy cavity.  I have ordered:dose calc.  PLAN:  The patient will receive 50.4 Gy in 28 fractions followed by a boost to the lumpectomy cavity of 12 Gy for a cumulative dose of 62.4 Gy.  The patient was noted to have clips remaining in the breast after reduction mammoplasty so it is assumed that rmp did not remove surgical bed and therefore surgical margins are close warranting a boost.   Special treatment procedure was performed today due to the extra time and effort required by  myself to plan and prepare this patient for deep inspiration breath hold technique.  I have determined cardiac sparing to be of benefit to this patient to prevent long term cardiac damage due to radiation of the heart.  Bellows were placed on the patient's abdomen. To facilitate cardiac sparing, the patient was coached by the radiation therapists on breath hold techniques and breathing practice was performed. Practice waveforms were obtained. The patient was then scanned while maintaining breath hold in the treatment position.  This image was then transferred over to the imaging specialist. The imaging specialist then created a fusion of the free breathing and breath hold scans using the chest wall as the stable structure. I personally reviewed the fusion in axial, coronal and sagittal image planes.  Excellent cardiac sparing was obtained.  I felt the patient is an appropriate candidate for breath hold and the patient will be treated as such.  The image fusion was then reviewed with the patient to reinforce the necessity of reproducible breath hold. -----------------------------------  Blair Promise, PhD, MD  This document serves as a record of services personally performed by Gery Pray, MD. It was created on his behalf by Bethann Humble, a trained medical scribe. The creation of this record is based on the scribe's personal observations and the provider's statements to them. This document has been checked and approved by the attending provider.

## 2015-11-24 ENCOUNTER — Encounter: Payer: Self-pay | Admitting: Endocrinology

## 2015-11-24 ENCOUNTER — Ambulatory Visit (INDEPENDENT_AMBULATORY_CARE_PROVIDER_SITE_OTHER): Payer: 59 | Admitting: Endocrinology

## 2015-11-24 VITALS — BP 120/84 | HR 88 | Ht 64.0 in | Wt 248.0 lb

## 2015-11-24 DIAGNOSIS — E1142 Type 2 diabetes mellitus with diabetic polyneuropathy: Secondary | ICD-10-CM

## 2015-11-24 DIAGNOSIS — Z794 Long term (current) use of insulin: Secondary | ICD-10-CM

## 2015-11-24 LAB — POCT GLYCOSYLATED HEMOGLOBIN (HGB A1C): HEMOGLOBIN A1C: 8.8

## 2015-11-24 NOTE — Patient Instructions (Addendum)
check your blood sugar once a day.  vary the time of day when you check, between before the 3 meals, and at bedtime.  also check if you have symptoms of your blood sugar being too high or too low.  please keep a record of the readings and bring it to your next appointment here.  You can write it on any piece of paper.  please call us sooner if your blood sugar goes below 70, or if you have a lot of readings over 200.  Please continue the same medications for diabetes.  Please come back for a blood test appointment in 2-3 weeks.  Please come back for a follow-up appointment in 3 months.

## 2015-11-24 NOTE — Progress Notes (Signed)
Subjective:    Patient ID: Stacey Roberts, female    DOB: 02/26/1965, 50 y.o.   MRN: [id number]  HPI Pt has stage-1 papillary adenocarcinoma of the thyroid.    2/11: thyroidectomy: T1b N0 M0.  4/11: i-131 103 mci, with thyrogen.  12/11: neck US: single enlarged right cervical lymph node, nonspecific.   6/12: body scan (thyrogen) neg.   12/12: [place]: lymph node is stable.  6/13: [place]: overall node morphology and volume show very little change.   9/14  TG undetectable (ab neg) 3/15: TG undetectable (ab neg) 10/15 TG undetectable (ab neg).  3/17 TG undetectable (ab neg).   Pt returns for f/u of diabetes mellitus: DM type: Insulin-requiring type 2 Dx'ed: [id number] Complications: painful neuropathy of the lower extremities Therapy: insulin since early 2017 (also byetta and 2 oral meds) GDM: never DKA: never Severe hypoglycemia: never.   Pancreatitis: never.  Other: she declines multiple daily injections  she declines weight-loss surgery; edema precudes pioglitizone rx.  Interval history: Pt says she has recently been missing her insulin.  She was out recovering from cancer surgery, but she has now returned.  She will soon start XRT.   Past Medical History:  Diagnosis Date  . Allergy    seasonal  . Bronchitis    hx of  . Diabetes mellitus, type II ([place])   . GERD (gastroesophageal reflux disease)   . Headache   . Hyperlipidemia   . Hypertension   . Hypothyroidism   . Obesity   . Pneumonia    as a child  . Sleep apnea 2008   severe OSA-does not use a cpap  . Snoring   . 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 Buddy Duty    Past Surgical History:  Procedure Laterality Date  . BREAST BIOPSY  2000   s/p  . BREAST BIOPSY Left 01/22/2013   Procedure: RE-EXCISION LEFT BREAST DCIS;  Surgeon: Harl Bowie, MD;  Location: [facility];  Service: General;  Laterality:  Left;  . BREAST LUMPECTOMY Left 01/06/2013   Procedure: LUMPECTOMY;  Surgeon: Harl Bowie, MD;  Location: [facility];  Service: General;  Laterality: Left;  . BREAST LUMPECTOMY WITH NEEDLE LOCALIZATION AND AXILLARY SENTINEL LYMPH NODE BX Left 09/30/2015   Procedure: Left BREAST LUMPECTOMY WITH NEEDLE LOCALIZATION AND AXILLARY SENTINEL LYMPH NODE BX;  Surgeon: Coralie Keens, MD;  Location: [facility];  Service: General;  Laterality: Left;  . BREAST RECONSTRUCTION Bilateral 10/12/2015   Procedure: BREAST oncoplastic RECONSTRUCTION;  Surgeon: Irene Limbo, MD;  Location: [facility];  Service: Plastics;  Laterality: Bilateral;  . BREAST REDUCTION SURGERY Bilateral 10/12/2015   Procedure: MAMMARY REDUCTION  (BREAST);  Surgeon: Irene Limbo, MD;  Location: [facility];  Service: Plastics;  Laterality: Bilateral;  . DILATION AND CURETTAGE OF UTERUS  2004   s/p  . EXCISION OF BREAST LESION Left 10/12/2015   Procedure: Re-EXCISION OF BREAST;  Surgeon: Coralie Keens, MD;  Location: [facility];  Service: General;  Laterality: Left;  . THYROIDECTOMY  02/2009   Dr Harlow Asa  . TONSILLECTOMY  1982    Social History   Social History  . Marital status: Single    Spouse name: N/A  . Number of children: N/A  . Years of education: N/A   Occupational History  .  [employer]    Outpatient scheduling in radiology   Social  History Main Topics  . Smoking status: Former Smoker    Packs/day: 0.50    Types: Cigarettes    Quit date: 09/29/2007  . Smokeless tobacco: Never Used     Comment: Smoked on and off for 20 years. Quit about 8 years ago (as of 08/2015)  . Alcohol use 0.0 oz/week     Comment: occasionally  . Drug use: No  . Sexual activity: Yes   Other Topics Concern  . Not on file   Social History Narrative   The patient is divorced, moved to [place] in   2006 from [place].  She does not have any children, currently lives   with her  boyfriend and is working as a Nurse, adult for [employer].    No alcohol.  Tobacco use:  She quit 5 years ago.  She smoked on   and off for approximately 20 years.  No history of recreational drug   Use.Drinks two cups of coffee per work day.     Current Outpatient Prescriptions on File Prior to Visit  Medication Sig Dispense Refill  . exenatide (BYETTA 10 MCG PEN) 10 MCG/0.04ML SOPN injection Inject 0.04 mLs (10 mcg total) into the skin 2 (two) times daily with a meal. And pen needles 2/day 2.4 mL 11  . Insulin Glargine (LANTUS SOLOSTAR) 100 UNIT/ML Solostar Pen Inject 60 Units into the skin every morning. And pen needles 1/day 10 pen 11  . INVOKANA 300 MG TABS tablet TAKE 1 TABLET BY MOUTH DAILY 30 tablet 2  . levothyroxine (SYNTHROID, LEVOTHROID) 150 MCG tablet TAKE 1 TABLET BY MOUTH DAILY BEFORE BREAKFAST 30 tablet 3  . LYRICA 75 MG capsule TAKE 1 CAPSULE BY MOUTH TWICE A DAY 60 capsule 0  . metFORMIN (GLUCOPHAGE) 1000 MG tablet TAKE 1 TABLET BY MOUTH 2 TIMES DAILY WITH A MEAL. 60 tablet 6  . Multiple Vitamin (MULTIVITAMIN WITH MINERALS) TABS tablet Take 1 tablet by mouth daily.    Marland Kitchen olmesartan (BENICAR) 20 MG tablet TAKE 1 TABLET BY MOUTH DAILY 30 tablet 10  . oxyCODONE-acetaminophen (ROXICET) 5-325 MG tablet Take 1-2 tablets by mouth every 4 (four) hours as needed for severe pain. 50 tablet 0  . pantoprazole (PROTONIX) 40 MG tablet TAKE 1 TABLET BY MOUTH ONCE DAILY 90 tablet 2  . pregabalin (LYRICA) 75 MG capsule Take 1 capsule (75 mg total) by mouth 2 (two) times daily. 60 capsule 5  . rosuvastatin (CRESTOR) 10 MG tablet Take 1 tablet (10 mg total) by mouth daily. 90 tablet 3  . zinc sulfate 220 (50 Zn) MG capsule Take 220 mg by mouth.    . [DISCONTINUED] Calcium Carbonate 1500 MG TABS Take 1,500 mg by mouth daily.       No current facility-administered medications on file prior to visit.     Allergies  Allergen Reactions  . Penicillins Shortness Of Breath and Rash    Has  patient had a PCN reaction causing immediate rash, facial/tongue/throat swelling, SOB or lightheadedness with hypotension: Yes Has patient had a PCN reaction causing severe rash involving mucus membranes or skin necrosis: Yes Has patient had a PCN reaction that required hospitalization No Has patient had a PCN reaction occurring within the last 10 years: Yes If all of the above answers are "NO", then may proceed with Cephalosporin use.   . Clindamycin/Lincomycin Other (See Comments)    Severe stomach cramps  . Ace Inhibitors Cough    REACTION: cough; denies airway involvement  Family History  Problem Relation Age of Onset  . Dementia Father     deceased age 64 secondary to dementia  . Bipolar disorder Father   . Emphysema Father   . Heart disease Father   . COPD Father     smoker  . CAD Mother 68    Died age 61 of CAD  . Heart disease Mother   . Graves' disease Mother   . CAD Maternal Grandfather 60  . Heart disease Maternal Grandfather   . Other Sister 21    hysterectomy for fibroids  . Prostate cancer Brother     low grade; w/ surveillance  . Thyroid nodules Brother 68  . Fibroids Other     niece dx approx 62  . Kidney cancer Cousin     maternal 1st cousin dx 3-47; former smoker  . Lung cancer Other     maternal great aunt (MGM's sister); not a smoker  . Cancer Other     nephew dx neuroblastoma at 71.57 years old  . Colon cancer Neg Hx   . Colon polyps Neg Hx     BP 120/84   Pulse 88   Ht 5\' 4"  (1.626 m)   Wt 248 lb (112.5 kg)   SpO2 96%   BMI 42.57 kg/m   Review of Systems She has lost 2 lbs since last ov    Objective:   Physical Exam VITAL SIGNS:  See vs page GENERAL: no distress Pulses: dorsalis pedis intact bilat.   MSK: no deformity of the feet CV: trace bilat leg edema.  Skin:  no ulcer on the feet.  normal color and temp on the feet. Neuro: sensation is intact to touch on the feet.    Lab Results  Component Value Date   HGBA1C 8.8  11/24/2015      Assessment & Plan:  Insulin-requiring type 2 DM, with polyneuropathy: worse.   Noncompliance with cbg recording and insulin.  Pt says this has improved, due to being back at work.    Patient is advised the following: Patient Instructions  check your blood sugar once a day.  vary the time of day when you check, between before the 3 meals, and at bedtime.  also check if you have symptoms of your blood sugar being too high or too low.  please keep a record of the readings and bring it to your next appointment here.  You can write it on any piece of paper.  please call us sooner if your blood sugar goes below 70, or if you have a lot of readings over 200.  Please continue the same medications for diabetes.  Please come back for a blood test appointment in 2-3 weeks.  Please come back for a follow-up appointment in 3 months.

## 2015-11-27 NOTE — Addendum Note (Signed)
Encounter addended by: Gery Pray, MD on: 11/27/2015  4:05 PM<BR>    Actions taken: Sign clinical note

## 2015-11-29 DIAGNOSIS — Z880 Allergy status to penicillin: Secondary | ICD-10-CM | POA: Diagnosis not present

## 2015-11-29 DIAGNOSIS — Z170 Estrogen receptor positive status [ER+]: Secondary | ICD-10-CM | POA: Diagnosis not present

## 2015-11-29 DIAGNOSIS — Z510 Encounter for antineoplastic radiation therapy: Secondary | ICD-10-CM | POA: Diagnosis not present

## 2015-11-29 DIAGNOSIS — C50112 Malignant neoplasm of central portion of left female breast: Secondary | ICD-10-CM | POA: Diagnosis not present

## 2015-11-30 ENCOUNTER — Ambulatory Visit
Admission: RE | Admit: 2015-11-30 | Discharge: 2015-11-30 | Disposition: A | Discharge status: Home / Self Care | Payer: 59 | Source: Ambulatory Visit | Attending: Radiation Oncology | Admitting: Radiation Oncology

## 2015-11-30 DIAGNOSIS — Z170 Estrogen receptor positive status [ER+]: Secondary | ICD-10-CM | POA: Diagnosis not present

## 2015-11-30 DIAGNOSIS — C50112 Malignant neoplasm of central portion of left female breast: Secondary | ICD-10-CM

## 2015-11-30 DIAGNOSIS — Z880 Allergy status to penicillin: Secondary | ICD-10-CM | POA: Diagnosis not present

## 2015-11-30 DIAGNOSIS — Z510 Encounter for antineoplastic radiation therapy: Secondary | ICD-10-CM | POA: Diagnosis not present

## 2015-11-30 NOTE — Progress Notes (Signed)
  Radiation Oncology         (336) [phone] ________________________________  Name: Stacey Roberts MRN: [id number]  Date: 11/30/2015  DOB: [birth year]  Simulation Verification Note    ICD-9-CM ICD-10-CM   1. Malignant neoplasm of central portion of left breast in female, estrogen receptor positive ([place]) 174.1 C50.112    V86.0 Z17.0     Status: outpatient  NARRATIVE: The patient was brought to the treatment unit and placed in the planned treatment position. The clinical setup was verified. Then port films were obtained and uploaded to the radiation oncology medical record software.  The treatment beams were carefully compared against the planned radiation fields. The position location and shape of the radiation fields was reviewed. They targeted volume of tissue appears to be appropriately covered by the radiation beams. Organs at risk appear to be excluded as planned.  Based on my personal review, I approved the simulation verification. The patient's treatment will proceed as planned.  -----------------------------------  Blair Promise, PhD, MD

## 2015-12-01 ENCOUNTER — Ambulatory Visit
Admission: RE | Admit: 2015-12-01 | Discharge: 2015-12-01 | Disposition: A | Discharge status: Home / Self Care | Payer: 59 | Source: Ambulatory Visit | Attending: Radiation Oncology | Admitting: Radiation Oncology

## 2015-12-01 DIAGNOSIS — Z880 Allergy status to penicillin: Secondary | ICD-10-CM | POA: Diagnosis not present

## 2015-12-01 DIAGNOSIS — Z170 Estrogen receptor positive status [ER+]: Secondary | ICD-10-CM | POA: Diagnosis not present

## 2015-12-01 DIAGNOSIS — C50112 Malignant neoplasm of central portion of left female breast: Secondary | ICD-10-CM | POA: Diagnosis not present

## 2015-12-01 DIAGNOSIS — Z510 Encounter for antineoplastic radiation therapy: Secondary | ICD-10-CM | POA: Diagnosis not present

## 2015-12-02 ENCOUNTER — Ambulatory Visit
Admission: RE | Admit: 2015-12-02 | Discharge: 2015-12-02 | Disposition: A | Discharge status: Home / Self Care | Payer: 59 | Source: Ambulatory Visit | Attending: Radiation Oncology | Admitting: Radiation Oncology

## 2015-12-02 DIAGNOSIS — C50112 Malignant neoplasm of central portion of left female breast: Secondary | ICD-10-CM | POA: Diagnosis not present

## 2015-12-02 DIAGNOSIS — Z880 Allergy status to penicillin: Secondary | ICD-10-CM | POA: Diagnosis not present

## 2015-12-02 DIAGNOSIS — Z510 Encounter for antineoplastic radiation therapy: Secondary | ICD-10-CM | POA: Diagnosis not present

## 2015-12-02 DIAGNOSIS — Z170 Estrogen receptor positive status [ER+]: Secondary | ICD-10-CM | POA: Diagnosis not present

## 2015-12-03 ENCOUNTER — Ambulatory Visit
Admission: RE | Admit: 2015-12-03 | Discharge: 2015-12-03 | Disposition: A | Discharge status: Home / Self Care | Payer: 59 | Source: Ambulatory Visit | Attending: Radiation Oncology | Admitting: Radiation Oncology

## 2015-12-03 DIAGNOSIS — Z880 Allergy status to penicillin: Secondary | ICD-10-CM | POA: Diagnosis not present

## 2015-12-03 DIAGNOSIS — Z510 Encounter for antineoplastic radiation therapy: Secondary | ICD-10-CM | POA: Diagnosis not present

## 2015-12-03 DIAGNOSIS — C50112 Malignant neoplasm of central portion of left female breast: Secondary | ICD-10-CM | POA: Diagnosis not present

## 2015-12-03 DIAGNOSIS — Z170 Estrogen receptor positive status [ER+]: Secondary | ICD-10-CM | POA: Diagnosis not present

## 2015-12-04 ENCOUNTER — Telehealth: Payer: 59 | Admitting: Physician Assistant

## 2015-12-04 DIAGNOSIS — B9689 Other specified bacterial agents as the cause of diseases classified elsewhere: Secondary | ICD-10-CM

## 2015-12-04 DIAGNOSIS — J208 Acute bronchitis due to other specified organisms: Secondary | ICD-10-CM

## 2015-12-04 MED ORDER — BENZONATATE 100 MG PO CAPS: 100.0000 mg | ORAL_CAPSULE | Freq: Three times a day (TID) | ORAL | 0 refills | Status: DC | PRN

## 2015-12-04 MED ORDER — AZITHROMYCIN 250 MG PO TABS: ORAL_TABLET | ORAL | 0 refills | Status: DC

## 2015-12-04 NOTE — Progress Notes (Signed)

## 2015-12-06 ENCOUNTER — Ambulatory Visit
Admission: RE | Admit: 2015-12-06 | Discharge: 2015-12-06 | Disposition: A | Discharge status: Home / Self Care | Payer: 59 | Source: Ambulatory Visit | Attending: Radiation Oncology | Admitting: Radiation Oncology

## 2015-12-06 DIAGNOSIS — Z880 Allergy status to penicillin: Secondary | ICD-10-CM | POA: Diagnosis not present

## 2015-12-06 DIAGNOSIS — Z170 Estrogen receptor positive status [ER+]: Secondary | ICD-10-CM | POA: Diagnosis not present

## 2015-12-06 DIAGNOSIS — Z510 Encounter for antineoplastic radiation therapy: Secondary | ICD-10-CM | POA: Diagnosis not present

## 2015-12-06 DIAGNOSIS — C50112 Malignant neoplasm of central portion of left female breast: Secondary | ICD-10-CM | POA: Diagnosis not present

## 2015-12-07 ENCOUNTER — Ambulatory Visit
Admission: RE | Admit: 2015-12-07 | Discharge: 2015-12-07 | Disposition: A | Discharge status: Home / Self Care | Payer: 59 | Source: Ambulatory Visit | Attending: Radiation Oncology | Admitting: Radiation Oncology

## 2015-12-07 ENCOUNTER — Encounter: Payer: Self-pay | Admitting: Radiation Oncology

## 2015-12-07 VITALS — BP 144/83 | HR 93 | Temp 97.7°F | Resp 16 | Wt 249.8 lb

## 2015-12-07 DIAGNOSIS — R609 Edema, unspecified: Secondary | ICD-10-CM | POA: Insufficient documentation

## 2015-12-07 DIAGNOSIS — Z510 Encounter for antineoplastic radiation therapy: Secondary | ICD-10-CM | POA: Diagnosis not present

## 2015-12-07 DIAGNOSIS — C50112 Malignant neoplasm of central portion of left female breast: Secondary | ICD-10-CM | POA: Diagnosis not present

## 2015-12-07 DIAGNOSIS — Z880 Allergy status to penicillin: Secondary | ICD-10-CM | POA: Diagnosis not present

## 2015-12-07 DIAGNOSIS — Z170 Estrogen receptor positive status [ER+]: Secondary | ICD-10-CM | POA: Diagnosis not present

## 2015-12-07 DIAGNOSIS — Z923 Personal history of irradiation: Secondary | ICD-10-CM | POA: Diagnosis not present

## 2015-12-07 MED ORDER — RADIAPLEXRX EX GEL
Freq: Once | CUTANEOUS | Status: AC
  Administered 2015-12-07: 13:00:00 via TOPICAL

## 2015-12-07 MED ORDER — ALRA NON-METALLIC DEODORANT (RAD-ONC)
1.0000 "application " | Freq: Once | TOPICAL | Status: AC
  Administered 2015-12-07: 1 via TOPICAL

## 2015-12-07 NOTE — Progress Notes (Signed)
Weight and vitals stable. Denies pain. Reports left breast feels warm to the touch. Reports left breast felt itchy over the weekend. Reports applying aloe from plant to itchy skin. No hyperpigmentation noted within treatment field. No evidence of lymphedema noted. Reports mild fatigue.   BP (!) 144/83 (BP Location: Right Arm, Patient Position: Sitting, Cuff Size: Normal)   Pulse 93   Temp 97.7 F (36.5 C) (Oral)   Resp 16   Wt 249 lb 12.8 oz (113.3 kg)   SpO2 100%   BMI 42.88 kg/m  Wt Readings from Last 3 Encounters:  12/07/15 249 lb 12.8 oz (113.3 kg)  11/24/15 248 lb (112.5 kg)  10/27/15 247 lb (112 kg)

## 2015-12-07 NOTE — Progress Notes (Signed)
  Radiation Oncology         [phone] ________________________________  Name: Stacey Roberts MRN: [id number]  Date: 12/07/2015  DOB: [birth year]  Weekly Radiation Therapy Management    ICD-9-CM ICD-10-CM   1. Malignant neoplasm of central portion of left female breast, unspecified estrogen receptor status ([facility]) 174.1 C50.112 hyaluronate sodium (RADIAPLEXRX) gel     non-metallic deodorant (ALRA) 1 application     Current Dose: 9 Gy     Planned Dose:  62.4 Gy  Narrative . . . . . . . . The patient presents for routine under treatment assessment.                                   She has noticed some mild fatigue. She has noticed some mild warmth to the left breast. She denies any chills or fever has noticed some mild pruritus                                 Set-up films were reviewed.                                 The chart was checked. Physical Findings. . .  weight is 249 lb 12.8 oz (113.3 kg). Her oral temperature is 97.7 F (36.5 C). Her blood pressure is 144/83 (abnormal) and her pulse is 93. Her respiration is 16 and oxygen saturation is 100%. . Weight essentially stable.  Lungs are clear to auscultation bilaterally. Heart has regular rate and rhythm. No palpable cervical, supraclavicular, or axillary adenopathy. Abdomen soft, non-tender, normal bowel sounds. Patient does have some erythema to the lower aspect of the breast which seems to be related to her reduction mammoplasty scars. Some mild edema noted in the central breast. No obvious signs of infection  Impression . . . . . . . The patient is tolerating radiation. Plan . . . . . . . . . . . . Continue treatment as planned.  ________________________________   Blair Promise, PhD, MD

## 2015-12-08 ENCOUNTER — Ambulatory Visit
Admission: RE | Admit: 2015-12-08 | Discharge: 2015-12-08 | Disposition: A | Discharge status: Home / Self Care | Payer: 59 | Source: Ambulatory Visit | Attending: Radiation Oncology | Admitting: Radiation Oncology

## 2015-12-08 DIAGNOSIS — Z880 Allergy status to penicillin: Secondary | ICD-10-CM | POA: Diagnosis not present

## 2015-12-08 DIAGNOSIS — Z510 Encounter for antineoplastic radiation therapy: Secondary | ICD-10-CM | POA: Diagnosis not present

## 2015-12-08 DIAGNOSIS — C50112 Malignant neoplasm of central portion of left female breast: Secondary | ICD-10-CM | POA: Diagnosis not present

## 2015-12-08 DIAGNOSIS — Z170 Estrogen receptor positive status [ER+]: Secondary | ICD-10-CM | POA: Diagnosis not present

## 2015-12-09 ENCOUNTER — Encounter: Payer: Self-pay | Admitting: *Deleted

## 2015-12-09 ENCOUNTER — Ambulatory Visit
Admission: RE | Admit: 2015-12-09 | Discharge: 2015-12-09 | Disposition: A | Discharge status: Home / Self Care | Payer: 59 | Source: Ambulatory Visit | Attending: Radiation Oncology | Admitting: Radiation Oncology

## 2015-12-09 DIAGNOSIS — Z510 Encounter for antineoplastic radiation therapy: Secondary | ICD-10-CM | POA: Diagnosis not present

## 2015-12-09 DIAGNOSIS — C50112 Malignant neoplasm of central portion of left female breast: Secondary | ICD-10-CM | POA: Diagnosis not present

## 2015-12-09 DIAGNOSIS — Z880 Allergy status to penicillin: Secondary | ICD-10-CM | POA: Diagnosis not present

## 2015-12-09 DIAGNOSIS — Z170 Estrogen receptor positive status [ER+]: Secondary | ICD-10-CM | POA: Diagnosis not present

## 2015-12-09 NOTE — Progress Notes (Signed)
[facility]  Clinical Social Work was referred by pt to review and complete healthcare advance directives.  Pt wanted to update previous ADRs as she is no longer engaged to previous person designated as Economist. Clinical Social Worker met with patient in [place] office to complete ADRs.  The patient designated Regino Schultze as their primary healthcare agent and Tulsi Crossett as their secondary agent.  Patient also completed healthcare living will.    Clinical Social Worker notarized documents and made copies for patient/family. Clinical Social Worker will send documents to medical records to be scanned into patient's chart. Clinical Social Worker encouraged patient/family to contact with any additional questions or concerns.  Loren Racer, [place] Worker [facility]  [place] Phone: [phone] Fax: [phone]

## 2015-12-10 ENCOUNTER — Ambulatory Visit
Admission: RE | Admit: 2015-12-10 | Discharge: 2015-12-10 | Disposition: A | Discharge status: Home / Self Care | Payer: 59 | Source: Ambulatory Visit | Attending: Radiation Oncology | Admitting: Radiation Oncology

## 2015-12-10 DIAGNOSIS — C50112 Malignant neoplasm of central portion of left female breast: Secondary | ICD-10-CM | POA: Diagnosis not present

## 2015-12-10 DIAGNOSIS — Z170 Estrogen receptor positive status [ER+]: Secondary | ICD-10-CM | POA: Diagnosis not present

## 2015-12-10 DIAGNOSIS — Z510 Encounter for antineoplastic radiation therapy: Secondary | ICD-10-CM | POA: Diagnosis not present

## 2015-12-10 DIAGNOSIS — Z880 Allergy status to penicillin: Secondary | ICD-10-CM | POA: Diagnosis not present

## 2015-12-12 ENCOUNTER — Ambulatory Visit
Admission: RE | Admit: 2015-12-12 | Discharge: 2015-12-12 | Disposition: A | Discharge status: Home / Self Care | Payer: 59 | Source: Ambulatory Visit | Attending: Radiation Oncology | Admitting: Radiation Oncology

## 2015-12-12 DIAGNOSIS — Z170 Estrogen receptor positive status [ER+]: Secondary | ICD-10-CM | POA: Diagnosis not present

## 2015-12-12 DIAGNOSIS — Z880 Allergy status to penicillin: Secondary | ICD-10-CM | POA: Diagnosis not present

## 2015-12-12 DIAGNOSIS — Z510 Encounter for antineoplastic radiation therapy: Secondary | ICD-10-CM | POA: Diagnosis not present

## 2015-12-12 DIAGNOSIS — C50112 Malignant neoplasm of central portion of left female breast: Secondary | ICD-10-CM | POA: Diagnosis not present

## 2015-12-13 ENCOUNTER — Ambulatory Visit
Admission: RE | Admit: 2015-12-13 | Discharge: 2015-12-13 | Disposition: A | Discharge status: Home / Self Care | Payer: 59 | Source: Ambulatory Visit | Attending: Radiation Oncology | Admitting: Radiation Oncology

## 2015-12-13 ENCOUNTER — Encounter: Payer: Self-pay | Admitting: Radiation Oncology

## 2015-12-13 VITALS — BP 126/91 | HR 92 | Temp 97.8°F | Ht 64.0 in | Wt 254.0 lb

## 2015-12-13 DIAGNOSIS — C50112 Malignant neoplasm of central portion of left female breast: Secondary | ICD-10-CM | POA: Diagnosis not present

## 2015-12-13 DIAGNOSIS — Z170 Estrogen receptor positive status [ER+]: Secondary | ICD-10-CM | POA: Diagnosis not present

## 2015-12-13 DIAGNOSIS — Z510 Encounter for antineoplastic radiation therapy: Secondary | ICD-10-CM | POA: Diagnosis not present

## 2015-12-13 DIAGNOSIS — Z880 Allergy status to penicillin: Secondary | ICD-10-CM | POA: Diagnosis not present

## 2015-12-13 NOTE — Progress Notes (Signed)
Stacey Roberts has completed 10 fractions to her left breast.  She reports having occasional sharp pains in her left breast.  She reports having fatigue.  She is using radiaplex.  The skin on her left breast is red.  BP (!) 126/91 (BP Location: Right Wrist, Patient Position: Sitting)   Pulse 92   Temp 97.8 F (36.6 C) (Oral)   Ht 5\' 4"  (1.626 m)   Wt 254 lb (115.2 kg)   SpO2 98%   BMI 43.60 kg/m    Wt Readings from Last 3 Encounters:  12/13/15 254 lb (115.2 kg)  12/07/15 249 lb 12.8 oz (113.3 kg)  11/24/15 248 lb (112.5 kg)

## 2015-12-13 NOTE — Progress Notes (Signed)
  Radiation Oncology         [phone] ________________________________  Name: Stacey Roberts MRN: [id number]  Date: 12/13/2015  DOB: 06-11-1965  Weekly Radiation Therapy Management    ICD-9-CM ICD-10-CM   1. Malignant neoplasm of central portion of left female breast, unspecified estrogen receptor status ([facility]) 174.1 C50.112      Current Dose: 18 Gy     Planned Dose:  62.4 Gy  Narrative . . . . . . . . The patient presents for routine under treatment assessment.                                   She has noticed some mild fatigue. The patient has completed 10 fractions to her left breast. She reports having occasional sharp pains in her left breast. She reports fatigue. The patient reports using radiaplex.                                 Set-up films were reviewed.                                 The chart was checked. Physical Findings. . .  height is 5\' 4"  (1.626 m) and weight is 254 lb (115.2 kg). Her oral temperature is 97.8 F (36.6 C). Her blood pressure is 126/91 (abnormal) and her pulse is 92. Her oxygen saturation is 98%. . Weight essentially stable.  Lungs are clear to auscultation bilaterally. Heart has regular rate and rhythm. No palpable cervical, supraclavicular, or axillary adenopathy. Abdomen soft, non-tender, normal bowel sounds. Some erythema particularly in the lower aspect of breast and some swelling in the nipple areolar complex area. No signs of infection.  Impression . . . . . . . The patient is tolerating radiation. Plan . . . . . . . . . . . . Continue treatment as planned.  ________________________________   Blair Promise, PhD, MD  This document serves as a record of services personally performed by Gery Pray, MD. It was created on his behalf by Maryla Morrow, a trained medical scribe. The creation of this record is based on the scribe's personal observations and the provider's statements to them. This document has been checked and approved by the  attending provider.

## 2015-12-14 ENCOUNTER — Ambulatory Visit
Admission: RE | Admit: 2015-12-14 | Discharge: 2015-12-14 | Disposition: A | Discharge status: Home / Self Care | Payer: 59 | Source: Ambulatory Visit | Attending: Radiation Oncology | Admitting: Radiation Oncology

## 2015-12-14 ENCOUNTER — Other Ambulatory Visit: Payer: 59

## 2015-12-14 ENCOUNTER — Ambulatory Visit: Payer: 59 | Admitting: Radiation Oncology

## 2015-12-14 DIAGNOSIS — Z794 Long term (current) use of insulin: Secondary | ICD-10-CM | POA: Diagnosis not present

## 2015-12-14 DIAGNOSIS — E1142 Type 2 diabetes mellitus with diabetic polyneuropathy: Secondary | ICD-10-CM

## 2015-12-14 DIAGNOSIS — Z510 Encounter for antineoplastic radiation therapy: Secondary | ICD-10-CM | POA: Diagnosis not present

## 2015-12-14 DIAGNOSIS — Z170 Estrogen receptor positive status [ER+]: Secondary | ICD-10-CM | POA: Diagnosis not present

## 2015-12-14 DIAGNOSIS — C50112 Malignant neoplasm of central portion of left female breast: Secondary | ICD-10-CM | POA: Diagnosis not present

## 2015-12-14 DIAGNOSIS — Z880 Allergy status to penicillin: Secondary | ICD-10-CM | POA: Diagnosis not present

## 2015-12-15 ENCOUNTER — Ambulatory Visit: Payer: 59

## 2015-12-15 ENCOUNTER — Ambulatory Visit: Admission: RE | Admit: 2015-12-15 | Payer: 59 | Source: Ambulatory Visit

## 2015-12-17 ENCOUNTER — Ambulatory Visit: Payer: 59

## 2015-12-17 LAB — FRUCTOSAMINE: Fructosamine: 293 umol/L — ABNORMAL HIGH (ref 190–270)

## 2015-12-20 ENCOUNTER — Ambulatory Visit
Admission: RE | Admit: 2015-12-20 | Discharge: 2015-12-20 | Disposition: A | Discharge status: Home / Self Care | Payer: 59 | Source: Ambulatory Visit | Attending: Radiation Oncology | Admitting: Radiation Oncology

## 2015-12-20 DIAGNOSIS — C50112 Malignant neoplasm of central portion of left female breast: Secondary | ICD-10-CM | POA: Diagnosis not present

## 2015-12-20 DIAGNOSIS — Z510 Encounter for antineoplastic radiation therapy: Secondary | ICD-10-CM | POA: Diagnosis not present

## 2015-12-20 DIAGNOSIS — Z880 Allergy status to penicillin: Secondary | ICD-10-CM | POA: Diagnosis not present

## 2015-12-20 DIAGNOSIS — Z170 Estrogen receptor positive status [ER+]: Secondary | ICD-10-CM | POA: Diagnosis not present

## 2015-12-21 ENCOUNTER — Ambulatory Visit
Admission: RE | Admit: 2015-12-21 | Discharge: 2015-12-21 | Disposition: A | Discharge status: Home / Self Care | Payer: 59 | Source: Ambulatory Visit | Attending: Radiation Oncology | Admitting: Radiation Oncology

## 2015-12-21 ENCOUNTER — Encounter: Payer: Self-pay | Admitting: Radiation Oncology

## 2015-12-21 VITALS — BP 128/82 | HR 110 | Temp 97.8°F | Ht 64.0 in | Wt 250.8 lb

## 2015-12-21 DIAGNOSIS — C50112 Malignant neoplasm of central portion of left female breast: Secondary | ICD-10-CM

## 2015-12-21 DIAGNOSIS — Z510 Encounter for antineoplastic radiation therapy: Secondary | ICD-10-CM | POA: Diagnosis not present

## 2015-12-21 DIAGNOSIS — Z880 Allergy status to penicillin: Secondary | ICD-10-CM | POA: Diagnosis not present

## 2015-12-21 DIAGNOSIS — Z170 Estrogen receptor positive status [ER+]: Secondary | ICD-10-CM | POA: Diagnosis not present

## 2015-12-21 NOTE — Progress Notes (Signed)
  Radiation Oncology         [phone] ________________________________  Name: Stacey Roberts MRN: [id number]  Date: 12/21/2015  DOB: 1965/09/11  Weekly Radiation Therapy Management    ICD-9-CM ICD-10-CM   1. Malignant neoplasm of central portion of left female breast, unspecified estrogen receptor status ([facility]) 174.1 C50.112      Current Dose: 23.4 Gy     Planned Dose:  62.4 Gy  Narrative . . . . . . . . The patient presents for routine under treatment assessment.                                   The patient has completed 13 fractions to her left breast. She denies having pain. She reports fatigue. The patient reports using radiaplex as directed. Per nursing, the skin on her left breast is red with swelling around her nipple area.                                  Set-up films were reviewed.                                 The chart was checked. Physical Findings. . .  height is 5\' 4"  (1.626 m) and weight is 250 lb 12.8 oz (113.8 kg). Her oral temperature is 97.8 F (36.6 C). Her blood pressure is 128/82 and her pulse is 110 (abnormal). Her oxygen saturation is 99%. . Weight essentially stable.  Lungs are clear to auscultation bilaterally. Heart has regular rate and rhythm. No palpable cervical, supraclavicular, or axillary adenopathy. Abdomen soft, non-tender, normal bowel sounds. Erythema and some moderate swelling of the breast. No signs of infection. Impression . . . . . . . The patient is tolerating radiation. Plan . . . . . . . . . . . . Continue treatment as planned.  ________________________________   Blair Promise, PhD, MD  This document serves as a record of services personally performed by Gery Pray, MD. It was created on his behalf by Maryla Morrow, a trained medical scribe. The creation of this record is based on the scribe's personal observations and the provider's statements to them. This document has been checked and approved by the attending provider.

## 2015-12-21 NOTE — Progress Notes (Signed)
Stacey Roberts has completed 13 fractions to her left breast.  She denies having pain.  She reports having fatigue.  She is using radiaplex.  The skin on her left breast is red with swelling around her nipple area.  BP 128/82 (BP Location: Left Arm, Patient Position: Sitting)   Pulse (!) 110   Temp 97.8 F (36.6 C) (Oral)   Ht 5\' 4"  (1.626 m)   Wt 250 lb 12.8 oz (113.8 kg)   SpO2 99%   BMI 43.05 kg/m    Wt Readings from Last 3 Encounters:  12/21/15 250 lb 12.8 oz (113.8 kg)  12/13/15 254 lb (115.2 kg)  12/07/15 249 lb 12.8 oz (113.3 kg)

## 2015-12-22 ENCOUNTER — Ambulatory Visit
Admission: RE | Admit: 2015-12-22 | Discharge: 2015-12-22 | Disposition: A | Discharge status: Home / Self Care | Payer: 59 | Source: Ambulatory Visit | Attending: Radiation Oncology | Admitting: Radiation Oncology

## 2015-12-22 DIAGNOSIS — Z880 Allergy status to penicillin: Secondary | ICD-10-CM | POA: Diagnosis not present

## 2015-12-22 DIAGNOSIS — C50112 Malignant neoplasm of central portion of left female breast: Secondary | ICD-10-CM | POA: Diagnosis not present

## 2015-12-22 DIAGNOSIS — Z510 Encounter for antineoplastic radiation therapy: Secondary | ICD-10-CM | POA: Diagnosis not present

## 2015-12-22 DIAGNOSIS — Z170 Estrogen receptor positive status [ER+]: Secondary | ICD-10-CM | POA: Diagnosis not present

## 2015-12-23 ENCOUNTER — Ambulatory Visit
Admission: RE | Admit: 2015-12-23 | Discharge: 2015-12-23 | Disposition: A | Discharge status: Home / Self Care | Payer: 59 | Source: Ambulatory Visit | Attending: Radiation Oncology | Admitting: Radiation Oncology

## 2015-12-23 DIAGNOSIS — C50112 Malignant neoplasm of central portion of left female breast: Secondary | ICD-10-CM | POA: Diagnosis not present

## 2015-12-23 DIAGNOSIS — Z880 Allergy status to penicillin: Secondary | ICD-10-CM | POA: Diagnosis not present

## 2015-12-23 DIAGNOSIS — Z170 Estrogen receptor positive status [ER+]: Secondary | ICD-10-CM | POA: Diagnosis not present

## 2015-12-23 DIAGNOSIS — Z510 Encounter for antineoplastic radiation therapy: Secondary | ICD-10-CM | POA: Diagnosis not present

## 2015-12-23 MED FILL — metFORMIN HCL 1000 MG TABS: 1000 | 30 days supply | Qty: 60 | Fill #0

## 2015-12-23 MED FILL — LANTUS SOLOSTAR 100 UNITS/M: 100 | 50 days supply | Qty: 30 | Fill #0 | Status: TO

## 2015-12-24 ENCOUNTER — Ambulatory Visit
Admission: RE | Admit: 2015-12-24 | Discharge: 2015-12-24 | Disposition: A | Discharge status: Home / Self Care | Payer: 59 | Source: Ambulatory Visit | Attending: Radiation Oncology | Admitting: Radiation Oncology

## 2015-12-24 DIAGNOSIS — Z880 Allergy status to penicillin: Secondary | ICD-10-CM | POA: Diagnosis not present

## 2015-12-24 DIAGNOSIS — C50112 Malignant neoplasm of central portion of left female breast: Secondary | ICD-10-CM | POA: Diagnosis not present

## 2015-12-24 DIAGNOSIS — Z170 Estrogen receptor positive status [ER+]: Secondary | ICD-10-CM | POA: Diagnosis not present

## 2015-12-24 DIAGNOSIS — Z510 Encounter for antineoplastic radiation therapy: Secondary | ICD-10-CM | POA: Diagnosis not present

## 2015-12-27 ENCOUNTER — Ambulatory Visit: Payer: 59

## 2015-12-28 ENCOUNTER — Ambulatory Visit
Admission: RE | Admit: 2015-12-28 | Discharge: 2015-12-28 | Disposition: A | Discharge status: Home / Self Care | Payer: 59 | Source: Ambulatory Visit | Attending: Radiation Oncology | Admitting: Radiation Oncology

## 2015-12-28 ENCOUNTER — Encounter: Payer: Self-pay | Admitting: Radiation Oncology

## 2015-12-28 VITALS — BP 121/82 | HR 97 | Temp 97.7°F | Ht 64.0 in | Wt 254.4 lb

## 2015-12-28 DIAGNOSIS — Z510 Encounter for antineoplastic radiation therapy: Secondary | ICD-10-CM | POA: Insufficient documentation

## 2015-12-28 DIAGNOSIS — C50112 Malignant neoplasm of central portion of left female breast: Secondary | ICD-10-CM | POA: Insufficient documentation

## 2015-12-28 DIAGNOSIS — Z880 Allergy status to penicillin: Secondary | ICD-10-CM | POA: Diagnosis not present

## 2015-12-28 DIAGNOSIS — L539 Erythematous condition, unspecified: Secondary | ICD-10-CM | POA: Insufficient documentation

## 2015-12-28 DIAGNOSIS — Z170 Estrogen receptor positive status [ER+]: Secondary | ICD-10-CM | POA: Diagnosis not present

## 2015-12-28 MED ORDER — RADIAPLEXRX EX GEL
Freq: Once | CUTANEOUS | Status: AC
  Administered 2015-12-28: 17:00:00 via TOPICAL

## 2015-12-28 NOTE — Progress Notes (Signed)
  Radiation Oncology         (336) [phone] ________________________________  Name: [name] MRN: [id number]  Date: 12/28/2015  DOB: 11-26-1965  Weekly Radiation Therapy Management    ICD-9-CM ICD-10-CM   1. Malignant neoplasm of central portion of left female breast, unspecified estrogen receptor status ([facility]) 174.1 C50.112 hyaluronate sodium (RADIAPLEXRX) gel     Current Dose: 30.6 Gy     Planned Dose:  62.4 Gy  Narrative . . . . . . . . The patient presents for routine under treatment assessment.                                  The patient has completed 17 fractions to her left breast. She denies pain, but does report some tenderness to the lateral side of her left breast. Reports some mild fatigue. She continues to use radiaplex twice daily as directed. Per nursing, the patient's left breast is red.                                  Set-up films were reviewed.                                 The chart was checked. Physical Findings. . .  height is 5\' 4"  (1.626 m) and weight is 254 lb 6.4 oz (115.4 kg). Her temperature is 97.7 F (36.5 C). Her blood pressure is 121/82 and her pulse is 97. Her oxygen saturation is 98%. . Weight essentially stable.  Lungs are clear to auscultation bilaterally. Heart has regular rate and rhythm. No palpable cervical, supraclavicular, or axillary adenopathy. Abdomen soft, non-tender, normal bowel sounds. Erythema in the breast area with some hyperpigmentation changes. No skin breakdown noted. Impression . . . . . . . The patient is tolerating radiation. Plan . . . . . . . . . . . . Continue treatment as planned.  ________________________________   Blair Promise, PhD, MD  This document serves as a record of services personally performed by Gery Pray, MD. It was created on his behalf by Maryla Morrow, a trained medical scribe. The creation of this record is based on the scribe's personal observations and the provider's statements to them. This  document has been checked and approved by the attending provider.

## 2015-12-28 NOTE — Progress Notes (Signed)
Stacey Roberts is here for her 17th fraction of radiation to her Left Breast. She denies pain, but does report some mild fatigue. Her Left Breast is red, and she reports tenderness to the lateral side of her Left Breast. She continues to use radiaplex twice daily. She was given a second tube today.   BP 121/82   Pulse 97   Temp 97.7 F (36.5 C)   Ht 5\' 4"  (1.626 m)   Wt 254 lb 6.4 oz (115.4 kg)   SpO2 98% Comment: room air  BMI 43.67 kg/m    Wt Readings from Last 3 Encounters:  12/28/15 254 lb 6.4 oz (115.4 kg)  12/21/15 250 lb 12.8 oz (113.8 kg)  12/13/15 254 lb (115.2 kg)

## 2015-12-29 ENCOUNTER — Encounter: Payer: Self-pay | Admitting: Radiation Oncology

## 2015-12-29 ENCOUNTER — Encounter: Payer: Self-pay | Admitting: Endocrinology

## 2015-12-29 ENCOUNTER — Ambulatory Visit
Admission: RE | Admit: 2015-12-29 | Discharge: 2015-12-29 | Disposition: A | Discharge status: Home / Self Care | Payer: 59 | Source: Ambulatory Visit | Attending: Radiation Oncology | Admitting: Radiation Oncology

## 2015-12-29 DIAGNOSIS — C50112 Malignant neoplasm of central portion of left female breast: Secondary | ICD-10-CM | POA: Diagnosis not present

## 2015-12-29 DIAGNOSIS — Z170 Estrogen receptor positive status [ER+]: Secondary | ICD-10-CM | POA: Diagnosis not present

## 2015-12-29 DIAGNOSIS — Z510 Encounter for antineoplastic radiation therapy: Secondary | ICD-10-CM | POA: Diagnosis not present

## 2015-12-29 DIAGNOSIS — Z880 Allergy status to penicillin: Secondary | ICD-10-CM | POA: Diagnosis not present

## 2015-12-29 NOTE — Progress Notes (Signed)
Paperwork (matrix) received from doctor, faxed to [place] @ [phone], confirmation received, copy sent to patient 12/6

## 2015-12-30 ENCOUNTER — Encounter: Payer: Self-pay | Admitting: Oncology

## 2015-12-30 ENCOUNTER — Ambulatory Visit
Admission: RE | Admit: 2015-12-30 | Discharge: 2015-12-30 | Disposition: A | Discharge status: Home / Self Care | Payer: 59 | Source: Ambulatory Visit | Attending: Radiation Oncology | Admitting: Radiation Oncology

## 2015-12-30 ENCOUNTER — Ambulatory Visit (HOSPITAL_BASED_OUTPATIENT_CLINIC_OR_DEPARTMENT_OTHER): Payer: 59 | Admitting: Oncology

## 2015-12-30 ENCOUNTER — Encounter: Payer: Self-pay | Admitting: Endocrinology

## 2015-12-30 ENCOUNTER — Telehealth: Payer: Self-pay | Admitting: Oncology

## 2015-12-30 VITALS — BP 125/69 | HR 99 | Temp 98.1°F | Resp 18 | Ht 64.0 in | Wt 254.5 lb

## 2015-12-30 DIAGNOSIS — Z170 Estrogen receptor positive status [ER+]: Secondary | ICD-10-CM

## 2015-12-30 DIAGNOSIS — C50112 Malignant neoplasm of central portion of left female breast: Secondary | ICD-10-CM | POA: Diagnosis not present

## 2015-12-30 DIAGNOSIS — D0512 Intraductal carcinoma in situ of left breast: Secondary | ICD-10-CM

## 2015-12-30 DIAGNOSIS — C50119 Malignant neoplasm of central portion of unspecified female breast: Secondary | ICD-10-CM

## 2015-12-30 DIAGNOSIS — Z880 Allergy status to penicillin: Secondary | ICD-10-CM | POA: Diagnosis not present

## 2015-12-30 DIAGNOSIS — Z510 Encounter for antineoplastic radiation therapy: Secondary | ICD-10-CM | POA: Diagnosis not present

## 2015-12-30 NOTE — Progress Notes (Signed)
[place]  [phone] Fax:(336) [phone]     ID: Stacey Roberts DOB: 10-Apr-1965  MR#: [id number]  QQI#:[id number]  Patient Care Team: Jearld Fenton, NP as PCP - General (Internal Medicine) Kem Parkinson, [facility] as [facility] Management (Pharmacist) Chauncey Cruel, MD as Consulting Physician (Oncology) Coralie Keens, MD as Consulting Physician (General Surgery) Thurnell Lose, MD as Consulting Physician (Obstetrics and Gynecology) Gery Pray, MD as Consulting Physician (Radiation Oncology) Renato Shin, MD as Consulting Physician (Endocrinology) Irene Shipper, MD as Consulting Physician (Gastroenterology) Irene Limbo, MD as Consulting Physician (Plastic Surgery) OTHER MD:  CHIEF COMPLAINT: Recurrent ductal carcinoma in situ  CURRENT TREATMENT: adjuvant radiation   BREAST CANCER HISTORY: From the original intake note:  Stacey Roberts has a 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 She had left lumpectomy for that lesion 01/06/2013, showing a low-grade noninvasive ductal carcinoma which was estrogen receptor 99% and progesterone receptor 100% positive. Margins were focally positive and she had further excision 01/22/2013 which showed residual focal ductal carcinoma in situ 1 mm from the posterior margin. The patient refused adjuvant radiation and did not tolerate tamoxifen, which she took for approximately 2 months. She was last seen in our office in 2015.  Because of that history she receives diagnostic bilateral mammography yearly. Her 02/15/2015 study found the breast density to be category A. This study was negative. However in early August 2017 she developed a new left nipple discharge, which at times was bloody. Repeat left mammography with tomography and left breast ultrasonography 08/30/2015 now read the breast density to be category B. Aside some medial left breast masses and  postlumpectomy findings, none of which showed any change since 2012, there were new pleomorphic calcifications posterior to the lumpectomy area 1.4 cm. On exam there was no palpable lump. Ultrasound showed scarring behind the nipple which was unchanged from prior, but no definite mass.  Biopsy of the left breast upper outer quadrant area of calcifications 09/02/2015 showed ([id number]) ductal carcinoma in situ, grade 1. Focal invasion could not be excluded. There was not enough tissue for a prognostic panel.  On 09/10/2015 the patient underwent bilateral breast MRI with and without contrast. This again found the breast density to be category B. There was an area of non-masslike enhancement involving the upper outer quadrant of the left breast from the nipple posteriorly extending approximately 10 cm. The right breast was unremarkable. There was no evidence of adenopathy. MRI guided biopsy of the upper-outer quadrant retroareolar area of the left breast 09/16/2015 showed fat necrosis but no evidence of malignancy ([id number]).  Stacey Roberts's subsequent history is as detailed below  INTERVAL HISTORY: Stacey Roberts returns today for follow-up of her ductal carcinoma in situ. Since her last visit here she underwent genetics testing, which showed no deleterious mutation. She then underwent reexcision of her left breast positive margin, which was cleared, and she had bilateral mammoplasty, all of this on 10/12/2015. The pathology was benign ([id number]).  The patient then proceeded to adjuvant radiation, which is scheduled to be completed 01/21/2016  REVIEW OF SYSTEMS: Stacey Roberts is feeling a little tired from the radiation but continues to work full time. She is having some erythema but no blistering. Hot flashes are moderate. A detailed review of systems today was otherwise stable  PAST MEDICAL HISTORY: Past Medical History:  Diagnosis Date  . Allergy    seasonal  . Bronchitis    hx of  .  Diabetes mellitus, type  II ([place])   . GERD (gastroesophageal reflux disease)   . Headache   . Hyperlipidemia   . Hypertension   . Hypothyroidism   . Obesity   . Pneumonia    as a child  . Sleep apnea 2008   severe OSA-does not use a cpap  . Snoring   . 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 Buddy Duty    PAST SURGICAL HISTORY: Past Surgical History:  Procedure Laterality Date  . BREAST BIOPSY  2000   s/p  . BREAST BIOPSY Left 01/22/2013   Procedure: RE-EXCISION LEFT BREAST DCIS;  Surgeon: Harl Bowie, MD;  Location: [facility];  Service: General;  Laterality: Left;  . BREAST LUMPECTOMY Left 01/06/2013   Procedure: LUMPECTOMY;  Surgeon: Harl Bowie, MD;  Location: [place];  Service: General;  Laterality: Left;  . BREAST LUMPECTOMY WITH NEEDLE LOCALIZATION AND AXILLARY SENTINEL LYMPH NODE BX Left 09/30/2015   Procedure: Left BREAST LUMPECTOMY WITH NEEDLE LOCALIZATION AND AXILLARY SENTINEL LYMPH NODE BX;  Surgeon: Coralie Keens, MD;  Location: [place];  Service: General;  Laterality: Left;  . BREAST RECONSTRUCTION Bilateral 10/12/2015   Procedure: BREAST oncoplastic RECONSTRUCTION;  Surgeon: Irene Limbo, MD;  Location: [place];  Service: Plastics;  Laterality: Bilateral;  . BREAST REDUCTION SURGERY Bilateral 10/12/2015   Procedure: MAMMARY REDUCTION  (BREAST);  Surgeon: Irene Limbo, MD;  Location: [facility];  Service: Plastics;  Laterality: Bilateral;  . DILATION AND CURETTAGE OF UTERUS  2004   s/p  . EXCISION OF BREAST LESION Left 10/12/2015   Procedure: Re-EXCISION OF BREAST;  Surgeon: Coralie Keens, MD;  Location: [place];  Service: General;  Laterality: Left;  . THYROIDECTOMY  02/2009   Dr Harlow Asa  . TONSILLECTOMY  1982    FAMILY HISTORY Family History  Problem Relation Age of Onset  . Dementia Father      deceased age 10 secondary to dementia  . Bipolar disorder Father   . Emphysema Father   . Heart disease Father   . COPD Father     smoker  . CAD Mother 26    Died age 34 of CAD  . Heart disease Mother   . Graves' disease Mother   . CAD Maternal Grandfather 60  . Heart disease Maternal Grandfather   . Other Sister 4    hysterectomy for fibroids  . Prostate cancer Brother     low grade; w/ surveillance  . Thyroid nodules Brother 74  . Fibroids Other     niece dx approx 41  . Kidney cancer Cousin     maternal 1st cousin dx 35-47; former smoker  . Lung cancer Other     maternal great aunt (MGM's sister); not a smoker  . Cancer Other     nephew dx neuroblastoma at 43.62 years old  . Colon cancer Neg Hx   . Colon polyps Neg Hx   The patient's father died at the age of 27 from myocardial infarction in the setting of dementia. The patient's mother died at the age of 28 from heart disease. Ronia has 2 brothers, 2 sisters. There is no history of breast ovarian or colon cancer in the family. Of course she has a history of thyroid cancer  GYNECOLOGIC HISTORY:  No LMP recorded. Patient is not currently having periods (Reason: IUD). Menarche age 30, she is  GX P0. She has a Mirena in place.  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 She is divorced. The patient is not a church attender.    ADVANCED DIRECTIVES: the patient has named her sister Caren Griffins as her healthcare power of attorney  HEALTH MAINTENANCE: Social History  Substance Use Topics  . Smoking status: Former Smoker    Packs/day: 0.50    Types: Cigarettes    Quit date: 09/29/2007  . Smokeless tobacco: Never Used     Comment: Smoked on and off for 20 years. Quit about 8 years ago (as of 08/2015)  . Alcohol use 0.0 oz/week     Comment: occasionally     Colonoscopy:  PAP:  Bone density:   Allergies  Allergen Reactions  . Penicillins Shortness Of Breath and Rash    Has patient had a  PCN reaction causing immediate rash, facial/tongue/throat swelling, SOB or lightheadedness with hypotension: Yes Has patient had a PCN reaction causing severe rash involving mucus membranes or skin necrosis: Yes Has patient had a PCN reaction that required hospitalization No Has patient had a PCN reaction occurring within the last 10 years: Yes If all of the above answers are "NO", then may proceed with Cephalosporin use.   . Clindamycin/Lincomycin Other (See Comments)    Severe stomach cramps  . Ace Inhibitors Cough    REACTION: cough; denies airway involvement     Current Outpatient Prescriptions  Medication Sig Dispense Refill  . benzonatate (TESSALON) 100 MG capsule Take 1 capsule (100 mg total) by mouth 3 (three) times daily as needed for cough. (Patient not taking: Reported on 12/28/2015) 30 capsule 0  . exenatide (BYETTA 10 MCG PEN) 10 MCG/0.04ML SOPN injection Inject 0.04 mLs (10 mcg total) into the skin 2 (two) times daily with a meal. And pen needles 2/day 2.4 mL 11  . Insulin Glargine (LANTUS SOLOSTAR) 100 UNIT/ML Solostar Pen Inject 60 Units into the skin every morning. And pen needles 1/day 10 pen 11  . INVOKANA 300 MG TABS tablet TAKE 1 TABLET BY MOUTH DAILY 30 tablet 2  . levothyroxine (SYNTHROID, LEVOTHROID) 150 MCG tablet TAKE 1 TABLET BY MOUTH DAILY BEFORE BREAKFAST 30 tablet 3  . LYRICA 75 MG capsule TAKE 1 CAPSULE BY MOUTH TWICE A DAY 60 capsule 0  . metFORMIN (GLUCOPHAGE) 1000 MG tablet TAKE 1 TABLET BY MOUTH 2 TIMES DAILY WITH A MEAL. 60 tablet 6  . Multiple Vitamin (MULTIVITAMIN WITH MINERALS) TABS tablet Take 1 tablet by mouth daily.    . non-metallic deodorant Jethro Poling) MISC Apply 1 application topically daily as needed.    Marland Kitchen olmesartan (BENICAR) 20 MG tablet TAKE 1 TABLET BY MOUTH DAILY 30 tablet 10  . oxyCODONE-acetaminophen (ROXICET) 5-325 MG tablet Take 1-2 tablets by mouth every 4 (four) hours as needed for severe pain. (Patient not taking: Reported on 12/28/2015) 50  tablet 0  . pantoprazole (PROTONIX) 40 MG tablet TAKE 1 TABLET BY MOUTH ONCE DAILY 90 tablet 2  . pregabalin (LYRICA) 75 MG capsule Take 1 capsule (75 mg total) by mouth 2 (two) times daily. 60 capsule 5  . rosuvastatin (CRESTOR) 10 MG tablet Take 1 tablet (10 mg total) by mouth daily. 90 tablet 3  . Wound Cleansers (RADIAPLEX EX) Apply topically.    . zinc sulfate 220 (50 Zn) MG capsule Take 220 mg by mouth.     No current facility-administered medications for this visit.     OBJECTIVE: Middle-aged white woman in  no acute distress Vitals:   12/30/15 1547  BP: 125/69  Pulse: 99  Resp: 18  Temp: 98.1 F (36.7 C)     Body mass index is 43.68 kg/m.    ECOG FS:1 - Symptomatic but completely ambulatory  Sclerae unicteric, pupils round and equal Oropharynx clear and moist-- no thrush or other lesions No cervical or supraclavicular adenopathy Lungs no rales or rhonchi Heart regular rate and rhythm Abd soft, nontender, positive bowel sounds MSK no focal spinal tenderness, no upper extremity lymphedema Neuro: nonfocal, well oriented, appropriate affect Breasts: The right breast is unremarkable. The left breast is status post lumpectomy and is undergoing radiation. There is a minimal blush over the ratio port area but no desquamation. There are no residual masses or evidence of residual or recurrent disease. Both axillae are benign.   LAB RESULTS:  CMP     Component Value Date/Time   NA 138 09/30/2015 0942   NA 137 02/18/2013 1554   K 4.2 09/30/2015 0942   K 4.4 02/18/2013 1554   CL 105 09/30/2015 0942   CO2 24 09/30/2015 0942   CO2 25 02/18/2013 1554   GLUCOSE 190 (H) 09/30/2015 0942   GLUCOSE 300 (H) 02/18/2013 1554   BUN 11 09/30/2015 0942   BUN 14.2 02/18/2013 1554   CREATININE 0.48 09/30/2015 0942   CREATININE 0.8 02/18/2013 1554   CALCIUM 9.1 09/30/2015 0942   CALCIUM 9.2 02/18/2013 1554   PROT 7.5 07/20/2015 0858   PROT 7.2 02/18/2013 1554   ALBUMIN 4.1 07/20/2015  0858   ALBUMIN 3.6 02/18/2013 1554   AST 10 07/20/2015 0858   AST 10 02/18/2013 1554   ALT 12 07/20/2015 0858   ALT 16 02/18/2013 1554   ALKPHOS 74 07/20/2015 0858   ALKPHOS 85 02/18/2013 1554   BILITOT 0.5 07/20/2015 0858   BILITOT 0.33 02/18/2013 1554   GFRNONAA >60 09/30/2015 0942   GFRAA >60 09/30/2015 0942    INo results found for: SPEP, UPEP  Lab Results  Component Value Date   WBC 9.4 09/30/2015   NEUTROABS 6.7 (H) 02/18/2013   HGB 13.8 09/30/2015   HCT 42.9 09/30/2015   MCV 85.0 09/30/2015   PLT 288 09/30/2015      Chemistry      Component Value Date/Time   NA 138 09/30/2015 0942   NA 137 02/18/2013 1554   K 4.2 09/30/2015 0942   K 4.4 02/18/2013 1554   CL 105 09/30/2015 0942   CO2 24 09/30/2015 0942   CO2 25 02/18/2013 1554   BUN 11 09/30/2015 0942   BUN 14.2 02/18/2013 1554   CREATININE 0.48 09/30/2015 0942   CREATININE 0.8 02/18/2013 1554      Component Value Date/Time   CALCIUM 9.1 09/30/2015 0942   CALCIUM 9.2 02/18/2013 1554   ALKPHOS 74 07/20/2015 0858   ALKPHOS 85 02/18/2013 1554   AST 10 07/20/2015 0858   AST 10 02/18/2013 1554   ALT 12 07/20/2015 0858   ALT 16 02/18/2013 1554   BILITOT 0.5 07/20/2015 0858   BILITOT 0.33 02/18/2013 1554       No results found for: LABCA2  No components found for: LABCA125  No results for input(s): INR in the last 168 hours.  Urinalysis    Component Value Date/Time   COLORURINE YELLOW 03/15/2009 0835   APPEARANCEUR CLOUDY (A) 03/15/2009 0835   LABSPEC 1.024 03/15/2009 0835   PHURINE 7.5 03/15/2009 0835   GLUCOSEU NEGATIVE 03/15/2009 0835   GLUCOSEU NEGATIVE 01/09/2008 1524  HGBUR NEGATIVE 03/15/2009 [place] 03/15/2009 [place] 03/15/2009 0835   PROTEINUR NEGATIVE 03/15/2009 0835   UROBILINOGEN 1.0 03/15/2009 0835   NITRITE NEGATIVE 03/15/2009 0835   LEUKOCYTESUR TRACE (A) 03/15/2009 0835     STUDIES: No results found.  ELIGIBLE FOR AVAILABLE  RESEARCH PROTOCOL: No  ASSESSMENT: 50 y.o. BRCA negative [place] woman   (1) status post central left breast lumpectomy 01/06/2013 for a 1.5 cm ductal carcinoma in situ, grade 1, estrogen receptor 99% positive, progesterone receptor 100% positive, with a focally positive margin  (2) status post additional surgery for margin clearance 01/22/2013 showed the posterior margin negative at 0.1 cm  (3) the patient decided against adjuvant radiation; she was supposed to have started tamoxifen May 2015,  (4) left breast upper outer quadrant biopsy 09/02/2015 finds ductal carcinoma in situ, grade 1, with not enough tissue for prognostic panel  (a) upper outer quadrant biopsy retroareolar biopsy 09/16/2015, found to fibroadipose tissue and fat necrosis but no evidence of malignancy  (5) genetics testing 09/21/2015 through the [facility] Common Hereditary Cancers Panel (Breast, Gyn, GI) performed by [facility], [place]) found no deleterious mutations in APC, ATM, AXIN2, BARD1, BMPR1A, BRCA1, BRCA2, BRIP1, CDH1, CDKN2A, CHEK2, DICER1, EPCAM, GREM1, KIT, MEN1, MLH1, MSH2, MSH6, MUTYH, NBN, NF1, PALB2, PDGFRA, PMS2, POLD1, POLE, PTEN, RAD50, RAD51C, RAD51D, SDHA, SDHB, SDHC, SDHD, SMAD4, SMARCA4, STK11, TP53, TSC1, TSC2, and VHL  (6) repeat left lumpectomy and sentinel node biopsyy 10/12/2015 documented a pT2  PN0, stage IIA  Invasive ductal carcinoma, grade 1, with a focally positive margin; estrogen and progesterone receptor positive, HER-2 nonamplified, with an MIB-1 of 3%.  (a)  Left breast reexcision with bilateral uncle plastics reconstruction 10/12/2015 achieved clear margins, with no evidence of malignancy  (7) Oncotype DX score of 3 predicts a risk of recurrence outside the breast in 10 years of 4% if the patient's only systemic therapy is tamoxifen for 5 years. It also predicts no benefit from chemotherapy.  (8) adjuvant radiation to be completed 01/21/2016  (8) to start  anti-estrogens  Mid-January; considering a hysterectomy to facilitate the choice  PLAN: We spent approximately 40 minutes today going over 20 situation. Given that she has stage II disease and did not receive chemotherapy, anti-estrogens will be her only form of systemic therapy. She understands this will cut her risk of recurrence in half.  She is using a Mirena IUD because she has significant problems with uterine hyperplasia. Doubtless this is due to a hyperestrogenic state secondary to the obesity problem.  If she took tamoxifen she could keep the Mirena in place, but the last time she tried to take tamoxifen she became bloated and gained a lot of weight. She does not think she would be able to tolerate that medication.  If she goes to anastrozole she would have to have the Mirena IUD removed because of course some progesterone will be absorbed through the vaginal wall and converted to estrogen, which would negate the effect of the anastrozole. However if we remove the Mirena then she would be concerned about endometrial hyperplasia problems (even though the lack of estrogen from anastrozole she'll help in that regard  Given all this but she very much would like to do it is going ahead and have a hysterectomy. I would certainly simplify the problem and I am not against that idea at all. I will be glad to write a letter to her gynecologist suggesting it. She could  then go on anastrozole, which would be the preferred agent in her case  Tentatively I am making her a return appointment with me late in January by which time we should be able to make a definitive decision on which way to proceed in terms of anti-estrogens. She should have recovered from radiation by then.  Chauncey Cruel, MD   12/30/2015 3:53 PM Medical Oncology and Hematology [facility] [address] [place], [place] [address] Tel. [phone]    Fax. [phone]

## 2015-12-30 NOTE — Telephone Encounter (Signed)
Appointment complete per 12/7 los. Per patient she will check mychart has to go for xrt.

## 2015-12-31 ENCOUNTER — Ambulatory Visit
Admission: RE | Admit: 2015-12-31 | Discharge: 2015-12-31 | Disposition: A | Discharge status: Home / Self Care | Payer: 59 | Source: Ambulatory Visit | Attending: Radiation Oncology | Admitting: Radiation Oncology

## 2015-12-31 DIAGNOSIS — C50112 Malignant neoplasm of central portion of left female breast: Secondary | ICD-10-CM | POA: Diagnosis not present

## 2015-12-31 DIAGNOSIS — Z880 Allergy status to penicillin: Secondary | ICD-10-CM | POA: Diagnosis not present

## 2015-12-31 DIAGNOSIS — Z170 Estrogen receptor positive status [ER+]: Secondary | ICD-10-CM | POA: Diagnosis not present

## 2015-12-31 DIAGNOSIS — Z510 Encounter for antineoplastic radiation therapy: Secondary | ICD-10-CM | POA: Diagnosis not present

## 2016-01-03 ENCOUNTER — Ambulatory Visit: Payer: 59

## 2016-01-03 ENCOUNTER — Ambulatory Visit
Admission: RE | Admit: 2016-01-03 | Discharge: 2016-01-03 | Disposition: A | Discharge status: Home / Self Care | Payer: 59 | Source: Ambulatory Visit | Attending: Radiation Oncology | Admitting: Radiation Oncology

## 2016-01-03 DIAGNOSIS — Z510 Encounter for antineoplastic radiation therapy: Secondary | ICD-10-CM | POA: Diagnosis not present

## 2016-01-03 DIAGNOSIS — Z880 Allergy status to penicillin: Secondary | ICD-10-CM | POA: Diagnosis not present

## 2016-01-03 DIAGNOSIS — Z170 Estrogen receptor positive status [ER+]: Secondary | ICD-10-CM | POA: Diagnosis not present

## 2016-01-03 DIAGNOSIS — C50112 Malignant neoplasm of central portion of left female breast: Secondary | ICD-10-CM | POA: Diagnosis not present

## 2016-01-03 MED FILL — LYRICA 75 MG CAPSULE: 75 | 30 days supply | Qty: 60 | Fill #0 | Status: TO

## 2016-01-03 MED FILL — OLMESARTAN MEDOXOMIL 20 MG: 20 | 30 days supply | Qty: 30 | Fill #0 | Status: TO

## 2016-01-03 MED FILL — LEVOTHYROXINE 150 MCG TAB: 150 | 30 days supply | Qty: 30 | Fill #0 | Status: TO

## 2016-01-04 ENCOUNTER — Encounter: Payer: Self-pay | Admitting: Radiation Oncology

## 2016-01-04 ENCOUNTER — Ambulatory Visit
Admission: RE | Admit: 2016-01-04 | Discharge: 2016-01-04 | Disposition: A | Discharge status: Home / Self Care | Payer: 59 | Source: Ambulatory Visit | Attending: Radiation Oncology | Admitting: Radiation Oncology

## 2016-01-04 VITALS — BP 110/79 | HR 103 | Temp 98.9°F | Ht 64.0 in | Wt 255.2 lb

## 2016-01-04 DIAGNOSIS — Z170 Estrogen receptor positive status [ER+]: Secondary | ICD-10-CM | POA: Insufficient documentation

## 2016-01-04 DIAGNOSIS — C50112 Malignant neoplasm of central portion of left female breast: Secondary | ICD-10-CM | POA: Insufficient documentation

## 2016-01-04 DIAGNOSIS — Z510 Encounter for antineoplastic radiation therapy: Secondary | ICD-10-CM | POA: Insufficient documentation

## 2016-01-04 NOTE — Progress Notes (Signed)
[name] presents for her 22nd fraction of radiation to her Left Breast. She denies pain. She reports some fatigue, and tires easily. She does report tenderness to her Left Breast. Her Left Breast is red. She has been using an aloe plant and Radiaplex to the Left Breast with relief of the soreness.   BP 110/79   Pulse (!) 103   Temp 98.9 F (37.2 C)   Ht 5\' 4"  (1.626 m)   Wt 255 lb 3.2 oz (115.8 kg)   SpO2 96% Comment: room air  BMI 43.80 kg/m    Wt Readings from Last 3 Encounters:  01/04/16 255 lb 3.2 oz (115.8 kg)  12/30/15 254 lb 8 oz (115.4 kg)  12/28/15 254 lb 6.4 oz (115.4 kg)

## 2016-01-04 NOTE — Progress Notes (Signed)
  Radiation Oncology         [phone] ________________________________  Name: Stacey Roberts MRN: [id number]  Date: 01/04/2016  DOB: [birth year]  Weekly Radiation Therapy Management    ICD-9-CM ICD-10-CM   1. Malignant neoplasm of central portion of left breast in female, estrogen receptor positive ([place]) 174.1 C50.112    V86.0 Z17.0      Current Dose: 39.6 Gy     Planned Dose:  62.4 Gy  Narrative . . . . . . . . The patient presents for routine under treatment assessment.                                  The patient has completed 22 fractions to the left breast. She denies pain. She reports some fatigue, and tires easily. She does report some tenderness to her left breast. She has been using an aloe plant and radiaplex to the left breast with relief of soreness.            The patient is without complaint.                                 Set-up films were reviewed.                                 The chart was checked. Physical Findings. . .  height is 5\' 4"  (1.626 m) and weight is 255 lb 3.2 oz (115.8 kg). Her temperature is 98.9 F (37.2 C). Her blood pressure is 110/79 and her pulse is 103 (abnormal). Her oxygen saturation is 96%. . Weight essentially stable.  Lungs are clear to auscultation bilaterally. Heart has regular rate and rhythm. Diffuse erythema throughout the left breast, no skin breakdown noted. Impression . . . . . . . The patient is tolerating radiation. Plan . . . . . . . . . . . . Continue treatment as planned.  ________________________________   Blair Promise, PhD, MD  This document serves as a record of services personally performed by Gery Pray, MD. It was created on his behalf by Maryla Morrow, a trained medical scribe. The creation of this record is based on the scribe's personal observations and the provider's statements to them. This document has been checked and approved by the attending provider.

## 2016-01-05 ENCOUNTER — Ambulatory Visit
Admission: RE | Admit: 2016-01-05 | Discharge: 2016-01-05 | Disposition: A | Discharge status: Home / Self Care | Payer: 59 | Source: Ambulatory Visit | Attending: Radiation Oncology | Admitting: Radiation Oncology

## 2016-01-05 DIAGNOSIS — Z170 Estrogen receptor positive status [ER+]: Secondary | ICD-10-CM | POA: Diagnosis not present

## 2016-01-05 DIAGNOSIS — C50112 Malignant neoplasm of central portion of left female breast: Secondary | ICD-10-CM | POA: Diagnosis not present

## 2016-01-05 DIAGNOSIS — Z510 Encounter for antineoplastic radiation therapy: Secondary | ICD-10-CM | POA: Diagnosis not present

## 2016-01-06 ENCOUNTER — Ambulatory Visit
Admission: RE | Admit: 2016-01-06 | Discharge: 2016-01-06 | Disposition: A | Discharge status: Home / Self Care | Payer: 59 | Source: Ambulatory Visit | Attending: Radiation Oncology | Admitting: Radiation Oncology

## 2016-01-06 DIAGNOSIS — Z170 Estrogen receptor positive status [ER+]: Secondary | ICD-10-CM | POA: Diagnosis not present

## 2016-01-06 DIAGNOSIS — C50112 Malignant neoplasm of central portion of left female breast: Secondary | ICD-10-CM | POA: Diagnosis not present

## 2016-01-06 DIAGNOSIS — Z510 Encounter for antineoplastic radiation therapy: Secondary | ICD-10-CM | POA: Diagnosis not present

## 2016-01-07 ENCOUNTER — Ambulatory Visit
Admission: RE | Admit: 2016-01-07 | Discharge: 2016-01-07 | Disposition: A | Discharge status: Home / Self Care | Payer: 59 | Source: Ambulatory Visit | Attending: Radiation Oncology | Admitting: Radiation Oncology

## 2016-01-07 ENCOUNTER — Other Ambulatory Visit: Payer: Self-pay | Admitting: Pharmacist

## 2016-01-07 DIAGNOSIS — Z510 Encounter for antineoplastic radiation therapy: Secondary | ICD-10-CM | POA: Diagnosis not present

## 2016-01-07 DIAGNOSIS — Z170 Estrogen receptor positive status [ER+]: Secondary | ICD-10-CM | POA: Diagnosis not present

## 2016-01-07 DIAGNOSIS — C50112 Malignant neoplasm of central portion of left female breast: Secondary | ICD-10-CM | POA: Diagnosis not present

## 2016-01-07 NOTE — Patient Outreach (Signed)
[facility]) Care Management  [place]   01/07/2016  VILENA CAJAS May 21, 1965 [id number]  Subjective: Patient presents today for 3 month diabetes follow-up as part of the employer-sponsored [employer] to Wellness program.  Current diabetes regimen includes Lantus 50 units daily, metformin 1000 mg twice daily .  Patient also continues on daily olmesartan and rosuvastatin.  Most recent MD follow-up was 11/24/2015.  Currently going through radiation treatment. Patient did fructosamine that showed A1C of 6.4% and she states Dr Loanne Drilling stopped Byetta and Invokana.   Patient reported dietary habits:Reports with the holidays she has not been watching what she is eating. Is talking with Dr Loanne Drilling about weight loss surgery.    Patient reported exercise habits: Slight walking at work.    Patient denies hypoglycemic events. Reports proper treatment knowledge.   Patient reports nocturia 1-2 times per night.  Patient denies pain/burning upon urination.  Patient denies neuropathy. Patient denies visual changes. Patient reports self foot exams. Denies changes.    Patient reported self monitored blood glucose frequency couple times a week (supposed to be checking once daily).   Does not have meter with her today.  Before dinner the other day was 285 mg/dL and the next morning was 188 mg/dL.        Objective:  Lab Results  Component Value Date   HGBA1C 8.8 11/24/2015   Lipid Panel     Component Value Date/Time   CHOL 139 07/20/2015 0858   TRIG 156.0 (H) 07/20/2015 0858   HDL 48.60 07/20/2015 0858   CHOLHDL 3 07/20/2015 0858   VLDL 31.2 07/20/2015 0858   LDLCALC 60 07/20/2015 0858   LDLDIRECT 153.0 09/01/2014 1634      Encounter Medications: Outpatient Encounter Prescriptions as of 01/07/2016  Medication Sig Note  . aspirin 81 MG chewable tablet Chew 81 mg by mouth daily.   . Insulin Glargine (LANTUS SOLOSTAR) 100 UNIT/ML Solostar Pen Inject 60 Units into the skin  every morning. And pen needles 1/day (Patient taking differently: Inject 50 Units into the skin every morning. And pen needles 1/day)   . levothyroxine (SYNTHROID, LEVOTHROID) 150 MCG tablet TAKE 1 TABLET BY MOUTH DAILY BEFORE BREAKFAST   . olmesartan (BENICAR) 20 MG tablet TAKE 1 TABLET BY MOUTH DAILY   . pantoprazole (PROTONIX) 40 MG tablet TAKE 1 TABLET BY MOUTH ONCE DAILY   . pregabalin (LYRICA) 75 MG capsule Take 1 capsule (75 mg total) by mouth 2 (two) times daily.   . rosuvastatin (CRESTOR) 10 MG tablet Take 1 tablet (10 mg total) by mouth daily.   . Wound Cleansers (RADIAPLEX EX) Apply topically.   . [DISCONTINUED] LYRICA 75 MG capsule TAKE 1 CAPSULE BY MOUTH TWICE A DAY   . [DISCONTINUED] metFORMIN (GLUCOPHAGE) 1000 MG tablet TAKE 1 TABLET BY MOUTH 2 TIMES DAILY WITH A MEAL.   . Multiple Vitamin (MULTIVITAMIN WITH MINERALS) TABS tablet Take 1 tablet by mouth daily.   . non-metallic deodorant Jethro Poling) MISC Apply 1 application topically daily as needed.   . [DISCONTINUED] benzonatate (TESSALON) 100 MG capsule Take 1 capsule (100 mg total) by mouth 3 (three) times daily as needed for cough. (Patient not taking: Reported on 01/04/2016)   . [DISCONTINUED] exenatide (BYETTA 10 MCG PEN) 10 MCG/0.04ML SOPN injection Inject 0.04 mLs (10 mcg total) into the skin 2 (two) times daily with a meal. And pen needles 2/day (Patient not taking: Reported on 01/07/2016)   . [DISCONTINUED] INVOKANA 300 MG TABS tablet TAKE 1 TABLET BY  MOUTH DAILY (Patient not taking: Reported on 01/07/2016)   . [DISCONTINUED] oxyCODONE-acetaminophen (ROXICET) 5-325 MG tablet Take 1-2 tablets by mouth every 4 (four) hours as needed for severe pain. (Patient not taking: Reported on 01/04/2016)   . [DISCONTINUED] zinc sulfate 220 (50 Zn) MG capsule Take 220 mg by mouth. 10/27/2015: Received from: [facility] Received Sig: Take 1 capsule (220 mg total) by mouth daily.   No facility-administered encounter  medications on file as of 01/07/2016.     Functional Status: In your present state of health, do you have any difficulty performing the following activities: 10/12/2015 10/08/2015  Hearing? N N  Vision? N N  Difficulty concentrating or making decisions? N N  Walking or climbing stairs? - N  Dressing or bathing? N N  Doing errands, shopping? - N  Some recent data might be hidden    Fall/Depression Screening: PHQ 2/9 Scores 10/08/2015 07/20/2015 02/15/2015 08/05/2014 09/09/2013  PHQ - 2 Score 0 0 0 0 1     Assessment:  Diabetes: Most recent A1C was 8.1% but fructosamine (patient on radiation treatment) showed an A1C corresponding to 6.6% which is at goal of less than 7%.    Plan/Goals for Next Visit: Discussed signs/symptoms and treatment of hypoglycemia Counseled on low carbohydrate diet and increasing exercise Encouraged patient to continue monitoring her blood glucose daily and to make good low carbohydrate food choices.   Next appointment to see me is: Patient plans to enroll in new employee sponsored [facility] Diabetes program    Bennye Alm, PharmD [facility] PGY2 Pharmacy Resident [phone]  [facility] CM Care Plan Problem One   Flowsheet Row Most Recent Value  Care Plan Problem One  Inconsistent diet  Role Documenting the Problem One  Clinical Pharmacist  Care Plan for Problem One  Active  [facility] Long Term Goal (31-90 days)  Patient will make low carbohydrate food choices when eating out over the next 90 days as measured by patient report  [facility] Long Term Goal Start Date  10/08/15  Interventions for Problem One Long Term Goal  Discussed the importance of monitoring carbohydrate intake and following diabetic diet (45-60 grams of carbs/meal and 15 grams of carbs for snacks).  Counseled on importance of low carbohydrate diet to achieve a goal A1C    [facility] CM Care Plan Problem Two   Flowsheet Row Most Recent Value  Care Plan Problem Two  Monitoring blood glucose  Role Documenting the  Problem Two  Clinical Pharmacist  Care Plan for Problem Two  Active  Interventions for Problem Two Long Term Goal   Discussed importance of checking her blood glucose especially during illness and radiation which is coming up  [facility] Long Term Goal (31-90) days  Patient will resume monitoring blood glucose at least once daily over the next 90 days per patient report.  [facility] Long Term Goal Start Date  10/08/15

## 2016-01-10 ENCOUNTER — Ambulatory Visit: Payer: 59

## 2016-01-10 ENCOUNTER — Ambulatory Visit
Admission: RE | Admit: 2016-01-10 | Discharge: 2016-01-10 | Disposition: A | Discharge status: Home / Self Care | Payer: 59 | Source: Ambulatory Visit | Attending: Radiation Oncology | Admitting: Radiation Oncology

## 2016-01-10 DIAGNOSIS — Z510 Encounter for antineoplastic radiation therapy: Secondary | ICD-10-CM | POA: Diagnosis not present

## 2016-01-10 DIAGNOSIS — Z170 Estrogen receptor positive status [ER+]: Secondary | ICD-10-CM | POA: Diagnosis not present

## 2016-01-10 DIAGNOSIS — C50112 Malignant neoplasm of central portion of left female breast: Secondary | ICD-10-CM | POA: Diagnosis not present

## 2016-01-11 ENCOUNTER — Ambulatory Visit
Admission: RE | Admit: 2016-01-11 | Discharge: 2016-01-11 | Disposition: A | Discharge status: Home / Self Care | Payer: 59 | Source: Ambulatory Visit | Attending: Radiation Oncology | Admitting: Radiation Oncology

## 2016-01-11 ENCOUNTER — Encounter: Payer: Self-pay | Admitting: Radiation Oncology

## 2016-01-11 ENCOUNTER — Ambulatory Visit: Payer: 59

## 2016-01-11 VITALS — BP 134/92 | HR 94 | Temp 98.1°F | Ht 64.0 in | Wt 258.0 lb

## 2016-01-11 DIAGNOSIS — Z510 Encounter for antineoplastic radiation therapy: Secondary | ICD-10-CM | POA: Diagnosis not present

## 2016-01-11 DIAGNOSIS — C50112 Malignant neoplasm of central portion of left female breast: Secondary | ICD-10-CM | POA: Diagnosis not present

## 2016-01-11 DIAGNOSIS — Z170 Estrogen receptor positive status [ER+]: Secondary | ICD-10-CM | POA: Diagnosis not present

## 2016-01-11 NOTE — Progress Notes (Signed)
  Radiation Oncology         [phone] ________________________________  Name: Stacey Roberts MRN: [id number]  Date: 01/11/2016  DOB: Aug 05, 1965  Weekly Radiation Therapy Management    ICD-9-CM ICD-10-CM   1. Malignant neoplasm of central portion of left breast in female, estrogen receptor positive ([place]) 174.1 C50.112    V86.0 Z17.0      Current Dose: 48.6 Gy     Planned Dose:  62.4 Gy  Narrative . . . . . . . . The patient presents for routine under treatment assessment.                                  Stacey Roberts is here for her 27th fraction of radiation to her Left Breast. She reports occasional stabbing pain to her Left Breast. The patient states that this pain moves around. She reports fatigue and is not sleeping well due to the pains in her Left Breast. Her Left Breast is red. She reports the most soreness to her Left Axilla area. She is using Radiaplex and an aloe vera plant to her Left Breast for pain relief.                                  Set-up films were reviewed.                                 The chart was checked. Physical Findings. . .  height is 5\' 4"  (1.626 m) and weight is 258 lb (117 kg). Her temperature is 98.1 F (36.7 C). Her blood pressure is 134/92 (abnormal) and her pulse is 94. Her oxygen saturation is 97%. . Weight essentially stable.  Lungs are clear to auscultation bilaterally. Heart has regular rate and rhythm. Diffuse erythema throughout the left breast, no skin breakdown. Some hyperpigmentation changes. Edema noted in the nipple area Impression . . . . . . . The patient is tolerating radiation. She has tried over-the-counter pain medications for her discomfort in the breast and has not been very helpful. She does not wish to consider narcotic pain medications at this time. Plan . . . . . . . . . . . . Continue treatment as planned.  ________________________________   Blair Promise, PhD, MD  This document serves as a record of services  personally performed by Gery Pray, MD. It was created on his behalf by Darcus Austin, a trained medical scribe. The creation of this record is based on the scribe's personal observations and the provider's statements to them. This document has been checked and approved by the attending provider.

## 2016-01-11 NOTE — Progress Notes (Signed)
Stacey Roberts is here for her 27th fraction of radiation to her Left Breast. She reports occasional stabbing pain to her Left Breast. She reports fatigue, and is not sleeping well due to pains in her Left Breast. Her Left Breast is red. She reports the most soreness to her Left Axilla area. She is using Radiaplex and an aloe vera plant to her Left Breast for pain relief.   BP (!) 134/92   Pulse 94   Temp 98.1 F (36.7 C)   Ht 5\' 4"  (1.626 m)   Wt 258 lb (117 kg)   SpO2 97% Comment: room air  BMI 44.29 kg/m    Wt Readings from Last 3 Encounters:  01/11/16 258 lb (117 kg)  01/04/16 255 lb 3.2 oz (115.8 kg)  12/30/15 254 lb 8 oz (115.4 kg)

## 2016-01-12 ENCOUNTER — Ambulatory Visit: Payer: 59

## 2016-01-12 ENCOUNTER — Ambulatory Visit
Admission: RE | Admit: 2016-01-12 | Discharge: 2016-01-12 | Disposition: A | Discharge status: Home / Self Care | Payer: 59 | Source: Ambulatory Visit | Attending: Radiation Oncology | Admitting: Radiation Oncology

## 2016-01-12 DIAGNOSIS — Z170 Estrogen receptor positive status [ER+]: Secondary | ICD-10-CM | POA: Diagnosis not present

## 2016-01-12 DIAGNOSIS — Z510 Encounter for antineoplastic radiation therapy: Secondary | ICD-10-CM | POA: Diagnosis not present

## 2016-01-12 DIAGNOSIS — C50112 Malignant neoplasm of central portion of left female breast: Secondary | ICD-10-CM | POA: Diagnosis not present

## 2016-01-13 ENCOUNTER — Ambulatory Visit: Payer: 59

## 2016-01-13 DIAGNOSIS — Z510 Encounter for antineoplastic radiation therapy: Secondary | ICD-10-CM | POA: Diagnosis not present

## 2016-01-13 DIAGNOSIS — Z170 Estrogen receptor positive status [ER+]: Secondary | ICD-10-CM | POA: Diagnosis not present

## 2016-01-13 DIAGNOSIS — C50112 Malignant neoplasm of central portion of left female breast: Secondary | ICD-10-CM | POA: Diagnosis not present

## 2016-01-14 ENCOUNTER — Ambulatory Visit
Admission: RE | Admit: 2016-01-14 | Discharge: 2016-01-14 | Disposition: A | Discharge status: Home / Self Care | Payer: 59 | Source: Ambulatory Visit | Attending: Radiation Oncology | Admitting: Radiation Oncology

## 2016-01-14 ENCOUNTER — Other Ambulatory Visit: Payer: Self-pay | Admitting: Radiation Oncology

## 2016-01-14 DIAGNOSIS — Z170 Estrogen receptor positive status [ER+]: Secondary | ICD-10-CM | POA: Diagnosis not present

## 2016-01-14 DIAGNOSIS — C50112 Malignant neoplasm of central portion of left female breast: Secondary | ICD-10-CM | POA: Diagnosis not present

## 2016-01-14 DIAGNOSIS — Z510 Encounter for antineoplastic radiation therapy: Secondary | ICD-10-CM | POA: Diagnosis not present

## 2016-01-14 MED ORDER — OXYCODONE-ACETAMINOPHEN 5-325 MG PO TABS: 1.0000 | ORAL_TABLET | ORAL | 0 refills | Status: DC | PRN

## 2016-01-14 NOTE — Progress Notes (Signed)
Patient reported having burning feeling and discomfort under her arm from skin irritation.  She has been given hydrogel pads to try.

## 2016-01-17 ENCOUNTER — Ambulatory Visit: Payer: 59

## 2016-01-18 ENCOUNTER — Ambulatory Visit
Admission: RE | Admit: 2016-01-18 | Discharge: 2016-01-18 | Disposition: A | Discharge status: Home / Self Care | Payer: 59 | Source: Ambulatory Visit | Attending: Radiation Oncology | Admitting: Radiation Oncology

## 2016-01-18 ENCOUNTER — Encounter: Payer: Self-pay | Admitting: Radiation Oncology

## 2016-01-18 ENCOUNTER — Other Ambulatory Visit: Payer: Self-pay | Admitting: Endocrinology

## 2016-01-18 ENCOUNTER — Ambulatory Visit: Payer: 59

## 2016-01-18 VITALS — BP 124/73 | HR 95 | Temp 98.0°F | Wt 259.8 lb

## 2016-01-18 DIAGNOSIS — Z170 Estrogen receptor positive status [ER+]: Secondary | ICD-10-CM | POA: Diagnosis not present

## 2016-01-18 DIAGNOSIS — C50112 Malignant neoplasm of central portion of left female breast: Secondary | ICD-10-CM | POA: Diagnosis not present

## 2016-01-18 DIAGNOSIS — C50111 Malignant neoplasm of central portion of right female breast: Secondary | ICD-10-CM

## 2016-01-18 DIAGNOSIS — Z510 Encounter for antineoplastic radiation therapy: Secondary | ICD-10-CM | POA: Diagnosis not present

## 2016-01-18 MED FILL — metFORMIN HCL 1000 MG TABS: 1000 | 30 days supply | Qty: 60 | Fill #0 | Status: TO

## 2016-01-18 MED FILL — ROSUVASTATIN CALCIUM 10 MG: 10 | 90 days supply | Qty: 90 | Fill #0 | Status: TO

## 2016-01-18 NOTE — Progress Notes (Signed)
   Weekly Management Note:  Outpatient    ICD-9-CM ICD-10-CM   1. Malignant neoplasm of central portion of right breast in female, estrogen receptor positive ([facility]) 174.1 C50.111    V86.0 Z17.0     Current Dose:  56.4 Gy  Projected Dose: 62.4 Gy   Narrative:  The patient presents for routine under treatment assessment.  CBCT/MVCT images/Port film x-rays were reviewed.  The chart was checked.   Ms. Dorminey is here for her 31st fraction of radiation to her Left Breast. She denies pain. She does have some mild fatigue. She has redness to her Left Breast. She reports tenderness to the lateral area of her Left Breast. She continues to use Radiaplex twice daily to her Left Breast. She is using Hydrogel pads which were given to her last week by Dr. Sondra Come with some relief. She has an aloe vera plant at home and has been using it on skin as well. [place] She has an interview soon and told the job she would be able to start the beginning of February.   Physical Findings:  weight is 259 lb 12.8 oz (117.8 kg). Her temperature is 98 F (36.7 C). Her blood pressure is 124/73 and her pulse is 95. Her oxygen saturation is 96%.   Wt Readings from Last 3 Encounters:  01/18/16 259 lb 12.8 oz (117.8 kg)  01/11/16 258 lb (117 kg)  01/04/16 255 lb 3.2 oz (115.8 kg)   Bright erythema over the left breast. Skin is intact.  Impression:  The patient is tolerating radiotherapy.  Plan:  Continue radiotherapy as planned. She will continue using aloe and radiaplex as directed, then she will begin using Vitamin E oil/lotion. She will complete radiation treatment soon and will return for follow up in 1 month.    ________________________________   Eppie Gibson, M.D.   This document serves as a record of services personally performed by Eppie Gibson, MD. It was created on her behalf by Arlyce Harman, a trained medical scribe. The creation of this record is  based on the scribe's personal observations and the provider's statements to them. This document has been checked and approved by the attending provider.

## 2016-01-18 NOTE — Progress Notes (Signed)
Stacey Roberts is here for her 31st fraction of radiation to her Left Breast. She denies pain. She does have some mild fatigue. She has redness to her Left Breast. She reports tenderness to the lateral area of her Left Breast. She continues to use Radiaplex twice daily to her Left Breast. She is using Hydrogel pads which were given to her last week by Dr. Sondra Come with some relief.   BP 124/73   Pulse 95   Temp 98 F (36.7 C)   Wt 259 lb 12.8 oz (117.8 kg)   SpO2 96% Comment: room air  BMI 44.59 kg/m    Wt Readings from Last 3 Encounters:  01/18/16 259 lb 12.8 oz (117.8 kg)  01/11/16 258 lb (117 kg)  01/04/16 255 lb 3.2 oz (115.8 kg)

## 2016-01-19 ENCOUNTER — Ambulatory Visit
Admission: RE | Admit: 2016-01-19 | Discharge: 2016-01-19 | Disposition: A | Discharge status: Home / Self Care | Payer: 59 | Source: Ambulatory Visit | Attending: Radiation Oncology | Admitting: Radiation Oncology

## 2016-01-19 DIAGNOSIS — Z510 Encounter for antineoplastic radiation therapy: Secondary | ICD-10-CM | POA: Diagnosis not present

## 2016-01-19 DIAGNOSIS — Z170 Estrogen receptor positive status [ER+]: Secondary | ICD-10-CM | POA: Diagnosis not present

## 2016-01-19 DIAGNOSIS — C50112 Malignant neoplasm of central portion of left female breast: Secondary | ICD-10-CM | POA: Diagnosis not present

## 2016-01-20 ENCOUNTER — Ambulatory Visit: Payer: 59

## 2016-01-20 ENCOUNTER — Ambulatory Visit
Admission: RE | Admit: 2016-01-20 | Discharge: 2016-01-20 | Disposition: A | Discharge status: Home / Self Care | Payer: 59 | Source: Ambulatory Visit | Attending: Radiation Oncology | Admitting: Radiation Oncology

## 2016-01-20 DIAGNOSIS — C50112 Malignant neoplasm of central portion of left female breast: Secondary | ICD-10-CM | POA: Diagnosis not present

## 2016-01-20 DIAGNOSIS — Z170 Estrogen receptor positive status [ER+]: Secondary | ICD-10-CM | POA: Diagnosis not present

## 2016-01-20 DIAGNOSIS — Z510 Encounter for antineoplastic radiation therapy: Secondary | ICD-10-CM | POA: Diagnosis not present

## 2016-01-21 ENCOUNTER — Ambulatory Visit
Admission: RE | Admit: 2016-01-21 | Discharge: 2016-01-21 | Disposition: A | Discharge status: Home / Self Care | Payer: 59 | Source: Ambulatory Visit | Attending: Radiation Oncology | Admitting: Radiation Oncology

## 2016-01-21 ENCOUNTER — Telehealth: Payer: Self-pay | Admitting: *Deleted

## 2016-01-21 DIAGNOSIS — Z510 Encounter for antineoplastic radiation therapy: Secondary | ICD-10-CM | POA: Diagnosis not present

## 2016-01-21 DIAGNOSIS — C50112 Malignant neoplasm of central portion of left female breast: Secondary | ICD-10-CM | POA: Diagnosis not present

## 2016-01-21 DIAGNOSIS — Z170 Estrogen receptor positive status [ER+]: Secondary | ICD-10-CM | POA: Diagnosis not present

## 2016-01-21 NOTE — Telephone Encounter (Signed)
  Oncology Nurse Navigator Documentation  Navigator Location: [facility] (01/21/16 1300)   )Navigator Encounter Type: Telephone (01/21/16 1300) Telephone: [place] Call (01/21/16 1300)                   Patient Visit Type: [id number] (01/21/16 1300) Treatment Phase: Final Radiation Tx (01/21/16 1300)                            Time Spent with Patient: 15 (01/21/16 1300)

## 2016-01-25 ENCOUNTER — Telehealth: Payer: 59 | Admitting: Family

## 2016-01-25 DIAGNOSIS — J209 Acute bronchitis, unspecified: Secondary | ICD-10-CM

## 2016-01-25 DIAGNOSIS — R05 Cough: Secondary | ICD-10-CM

## 2016-01-25 DIAGNOSIS — R059 Cough, unspecified: Secondary | ICD-10-CM

## 2016-01-25 MED ORDER — BENZONATATE 100 MG PO CAPS: 100.0000 mg | ORAL_CAPSULE | Freq: Three times a day (TID) | ORAL | 0 refills | Status: DC | PRN

## 2016-01-25 MED ORDER — AZITHROMYCIN 250 MG PO TABS: ORAL_TABLET | ORAL | 0 refills | Status: DC

## 2016-01-25 NOTE — Progress Notes (Signed)
We are sorry that you are not feeling well.  Here is how we plan to help!  Based on what you have shared with me it looks like you have upper respiratory tract inflammation that has resulted in a significant cough.  Inflammation and infection in the upper respiratory tract is commonly called bronchitis and has four common causes:  Allergies, Viral Infections, Acid Reflux and Bacterial Infections.  Allergies, viruses and acid reflux are treated by controlling symptoms or eliminating the cause. An example might be a cough caused by taking certain blood pressure medications. You stop the cough by changing the medication. Another example might be a cough caused by acid reflux. Controlling the reflux helps control the cough.  Based on your presentation I believe you most likely have A cough due to bacteria.  When patients have a fever and a productive cough with a change in color or increased sputum production, we are concerned about bacterial bronchitis.  If left untreated it can progress to pneumonia.  If your symptoms do not improve with your treatment plan it is important that you contact your provider.   I have prescribed Azithromyin 250 mg: two tables now and then one tablet daily for 4 additonal days    In addition you may use A prescription cough medication called Tessalon Perles 100mg . You may take 1-2 capsules every 8 hours as needed for your cough. Cough syrups have codeine (controlled med) and require a physical prescription. Cannot be prescribed through e-visit.    USE OF BRONCHODILATOR ("RESCUE") INHALERS: There is a risk from using your bronchodilator too frequently.  The risk is that over-reliance on a medication which only relaxes the muscles surrounding the breathing tubes can reduce the effectiveness of medications prescribed to reduce swelling and congestion of the tubes themselves.  Although you feel brief relief from the bronchodilator inhaler, your asthma may actually be worsening with  the tubes becoming more swollen and filled with mucus.  This can delay other crucial treatments, such as oral steroid medications. If you need to use a bronchodilator inhaler daily, several times per day, you should discuss this with your provider.  There are probably better treatments that could be used to keep your asthma under control.     HOME CARE . Only take medications as instructed by your medical team. . Complete the entire course of an antibiotic. . Drink plenty of fluids and get plenty of rest. . Avoid close contacts especially the very young and the elderly . Cover your mouth if you cough or cough into your sleeve. . Always remember to wash your hands . A steam or ultrasonic humidifier can help congestion.   GET HELP RIGHT AWAY IF: . You develop worsening fever. . You become short of breath . You cough up blood. . Your symptoms persist after you have completed your treatment plan MAKE SURE YOU   Understand these instructions.  Will watch your condition.  Will get help right away if you are not doing well or get worse.  Your e-visit answers were reviewed by a board certified advanced clinical practitioner to complete your personal care plan.  Depending on the condition, your plan could have included both over the counter or prescription medications. If there is a problem please reply  once you have received a response from your provider. Your safety is important to [place].  If you have drug allergies check your prescription carefully.    You can use MyChart to ask questions about today's visit,  request a non-urgent call back, or ask for a work or school excuse for 24 hours related to this e-Visit. If it has been greater than 24 hours you will need to follow up with your provider, or enter a new e-Visit to address those concerns. You will get an e-mail in the next two days asking about your experience.  I hope that your e-visit has been valuable and will speed your recovery. Thank  you for using e-visits.

## 2016-01-26 ENCOUNTER — Encounter: Payer: Self-pay | Admitting: Radiation Oncology

## 2016-01-26 NOTE — Progress Notes (Signed)
  Radiation Oncology         [phone] ________________________________  Name: Stacey Roberts MRN: [id number]  Date: 01/26/2016  DOB: [birth year]  End of Treatment Note  Diagnosis: Malignant neoplasm of central portion of left breast in female, estrogen receptor positive     Indication for treatment:  Curative     Radiation treatment dates:   12/01/15-01/11/16  Site/dose:  1) Left breast DIBH / 50.4 Gy in 28 fractions   2) Left breast boost / 12 Gy in 6 fractions  Beams/energy:   1) 3D / 10X, 15X    2) Isodose plan / 10X, 15X  Narrative: The patient tolerated radiation treatment relatively well. During treatment, the patient reported  pain in her left breast as well as fatigue. She did get some relief with hydrogel dressings. She developed brisk erythema in the treatment area but no moist desquamation at the completion of treatment.  Plan: The patient has completed radiation treatment. The patient will return to radiation oncology clinic for routine followup in one month. I advised them to call or return sooner if they have any questions or concerns related to their recovery or treatment.  -----------------------------------  Blair Promise, PhD, MD  This document serves as a record of services personally performed by Gery Pray, MD. It was created on his behalf by Bethann Humble, a trained medical scribe. The creation of this record is based on the scribe's personal observations and the provider's statements to them. This document has been checked and approved by the attending provider.

## 2016-01-28 ENCOUNTER — Encounter: Payer: Self-pay | Admitting: Oncology

## 2016-01-28 ENCOUNTER — Other Ambulatory Visit: Payer: Self-pay | Admitting: *Deleted

## 2016-01-28 MED ORDER — OSELTAMIVIR PHOSPHATE 75 MG PO CAPS: 75.0000 mg | ORAL_CAPSULE | Freq: Every day | ORAL | 0 refills | Status: DC

## 2016-01-28 NOTE — Telephone Encounter (Signed)
This RN spoke with Stacey Roberts per call stating exposure to niece in same household who has been diagnosed with the flu.  " the rest of the family has been put on Tamiflu and I was told to call my doctor " - Stacey Roberts recently completed rad onc for locally recurred breast cancer.  " I will be completing a prescription for z pak and have 1 more pill to take "  This RN discussed above including if she wants to avoid possible onset of flu per niece she should take the tamiflu- no noted interaction noted with zithromax.  Jazzilyn verbalized understanding.  Prescription sent to verified pharmacy.

## 2016-02-03 ENCOUNTER — Encounter: Payer: Self-pay | Admitting: Podiatry

## 2016-02-07 ENCOUNTER — Telehealth: Payer: Self-pay | Admitting: *Deleted

## 2016-02-07 MED ORDER — GABAPENTIN 300 MG PO CAPS: 300.0000 mg | ORAL_CAPSULE | Freq: Three times a day (TID) | ORAL | 3 refills | Status: DC

## 2016-02-14 ENCOUNTER — Other Ambulatory Visit: Payer: Self-pay | Admitting: *Deleted

## 2016-02-21 ENCOUNTER — Ambulatory Visit: Payer: 59 | Admitting: Oncology

## 2016-02-23 ENCOUNTER — Other Ambulatory Visit: Payer: Self-pay | Admitting: Endocrinology

## 2016-02-24 ENCOUNTER — Ambulatory Visit: Payer: 59 | Admitting: Endocrinology

## 2016-02-28 ENCOUNTER — Encounter: Payer: Self-pay | Admitting: Endocrinology

## 2016-02-28 ENCOUNTER — Other Ambulatory Visit: Payer: Self-pay

## 2016-02-28 MED ORDER — LEVOTHYROXINE SODIUM 150 MCG PO TABS: ORAL_TABLET | ORAL | 1 refills | Status: DC

## 2016-03-05 NOTE — Progress Notes (Signed)
[name]  [phone] Fax:(336) [phone]     ID: Stacey Roberts DOB: 17-Jun-1965  MR#: [id number]  HBZ#:[id number]  Patient Care Team: Jearld Fenton, NP as PCP - General (Internal Medicine) Kem Parkinson, [facility] as [place] Management (Pharmacist) Chauncey Cruel, MD as Consulting Physician (Oncology) Coralie Keens, MD as Consulting Physician (General Surgery) Thurnell Lose, MD as Consulting Physician (Obstetrics and Gynecology) Gery Pray, MD as Consulting Physician (Radiation Oncology) Renato Shin, MD as Consulting Physician (Endocrinology) Irene Shipper, MD as Consulting Physician (Gastroenterology) Irene Limbo, MD as Consulting Physician (Plastic Surgery) OTHER MD:  CHIEF COMPLAINT: Recurrent ductal carcinoma in situ  CURRENT TREATMENT: Tamoxifen   BREAST CANCER HISTORY: From the original intake note:  Stacey Roberts has a 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 She had left lumpectomy for that lesion 01/06/2013, showing a low-grade noninvasive ductal carcinoma which was estrogen receptor 99% and progesterone receptor 100% positive. Margins were focally positive and she had further excision 01/22/2013 which showed residual focal ductal carcinoma in situ 1 mm from the posterior margin. The patient refused adjuvant radiation and did not tolerate tamoxifen, which she took for approximately 2 months. She was last seen in our office in 2015.  Because of that history she receives diagnostic bilateral mammography yearly. Her 02/15/2015 study found the breast density to be category A. This study was negative. However in early August 2017 she developed a new left nipple discharge, which at times was bloody. Repeat left mammography with tomography and left breast ultrasonography 08/30/2015 now read the breast density to be category B. Aside some medial left breast masses and postlumpectomy  findings, none of which showed any change since 2012, there were new pleomorphic calcifications posterior to the lumpectomy area 1.4 cm. On exam there was no palpable lump. Ultrasound showed scarring behind the nipple which was unchanged from prior, but no definite mass.  Biopsy of the left breast upper outer quadrant area of calcifications 09/02/2015 showed ([id number]) ductal carcinoma in situ, grade 1. Focal invasion could not be excluded. There was not enough tissue for a prognostic panel.  On 09/10/2015 the patient underwent bilateral breast MRI with and without contrast. This again found the breast density to be category B. There was an area of non-masslike enhancement involving the upper outer quadrant of the left breast from the nipple posteriorly extending approximately 10 cm. The right breast was unremarkable. There was no evidence of adenopathy. MRI guided biopsy of the upper-outer quadrant retroareolar area of the left breast 09/16/2015 showed fat necrosis but no evidence of malignancy ([id number]).  Stacey Roberts's subsequent history is as detailed below  INTERVAL HISTORY: Stacey Roberts returns today for follow-up of her estrogen receptor positive breast cancer. Since last visit here she completed her radiation treatments. She did very well with these. She had mild erythema but no significant desquamation. She had mild fatigue. She was able to continue to work full time. She is now ready to discuss anti-estrogens.  She is considering moving to [place] where her sister lives--however she is having difficulty transitioning to a job there.  REVIEW OF SYSTEMS: [name] is participating in the wellness program, has a fit bit and wrist watch that counts are steps and calories, and has had her diabetes treatments more closely monitored. This is all very favorable. She is not yet on an exercise program for weight control and for diabetes management. She does have some wheezing and shortness  of breath as well as  a bit of a cough. She has not had a fever or any purulence. A detailed review of systems today was otherwise stable.  PAST MEDICAL HISTORY: Past Medical History:  Diagnosis Date  . Allergy    seasonal  . Bronchitis    hx of  . Diabetes mellitus, type II ([place])   . GERD (gastroesophageal reflux disease)   . Headache   . Hyperlipidemia   . Hypertension   . Hypothyroidism   . Obesity   . Pneumonia    as a child  . Sleep apnea 2008   severe OSA-does not use a cpap  . Snoring   . 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 Buddy Duty    PAST SURGICAL HISTORY: Past Surgical History:  Procedure Laterality Date  . BREAST BIOPSY  2000   s/p  . BREAST BIOPSY Left 01/22/2013   Procedure: RE-EXCISION LEFT BREAST DCIS;  Surgeon: Harl Bowie, MD;  Location: [place];  Service: General;  Laterality: Left;  . BREAST LUMPECTOMY Left 01/06/2013   Procedure: LUMPECTOMY;  Surgeon: Harl Bowie, MD;  Location: [place];  Service: General;  Laterality: Left;  . BREAST LUMPECTOMY WITH NEEDLE LOCALIZATION AND AXILLARY SENTINEL LYMPH NODE BX Left 09/30/2015   Procedure: Left BREAST LUMPECTOMY WITH NEEDLE LOCALIZATION AND AXILLARY SENTINEL LYMPH NODE BX;  Surgeon: Coralie Keens, MD;  Location: [place];  Service: General;  Laterality: Left;  . BREAST RECONSTRUCTION Bilateral 10/12/2015   Procedure: BREAST oncoplastic RECONSTRUCTION;  Surgeon: Irene Limbo, MD;  Location: [place];  Service: Plastics;  Laterality: Bilateral;  . BREAST REDUCTION SURGERY Bilateral 10/12/2015   Procedure: MAMMARY REDUCTION  (BREAST);  Surgeon: Irene Limbo, MD;  Location: [place];  Service: Plastics;  Laterality: Bilateral;  . DILATION AND CURETTAGE OF UTERUS  2004   s/p  . EXCISION OF BREAST LESION Left 10/12/2015   Procedure: Re-EXCISION OF BREAST;  Surgeon:  Coralie Keens, MD;  Location: [place];  Service: General;  Laterality: Left;  . THYROIDECTOMY  02/2009   Dr Harlow Asa  . TONSILLECTOMY  1982    FAMILY HISTORY Family History  Problem Relation Age of Onset  . Dementia Father     deceased age 68 secondary to dementia  . Bipolar disorder Father   . Emphysema Father   . Heart disease Father   . COPD Father     smoker  . CAD Mother 19    Died age 43 of CAD  . Heart disease Mother   . Graves' disease Mother   . CAD Maternal Grandfather 60  . Heart disease Maternal Grandfather   . Other Sister 56    hysterectomy for fibroids  . Prostate cancer Brother     low grade; w/ surveillance  . Thyroid nodules Brother 66  . Fibroids Other     niece dx approx 72  . Kidney cancer Cousin     maternal 1st cousin dx 39-47; former smoker  . Lung cancer Other     maternal great aunt (MGM's sister); not a smoker  . Cancer Other     nephew dx neuroblastoma at 15.3 years old  . Colon cancer Neg Hx   . Colon polyps Neg Hx   The patient's father died at the age of 32 from myocardial infarction in the setting of dementia. The patient's mother died at  the age of 32 from heart disease. Hera has 2 brothers, 2 sisters. There is no history of breast ovarian or colon cancer in the family. Of course she has a history of thyroid cancer  GYNECOLOGIC HISTORY:  No LMP recorded. Patient is not currently having periods (Reason: IUD). Menarche age 26, she is GX P0. She has a Mirena in place.  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 She is divorced. The patient is not a church attender.    ADVANCED DIRECTIVES: the patient has named her sister Caren Griffins as her healthcare power of attorney  HEALTH MAINTENANCE: Social History  Substance Use Topics  . Smoking status: Former Smoker    Packs/day: 0.50    Types: Cigarettes    Quit date: 09/29/2007  . Smokeless tobacco: Never Used     Comment: Smoked on and  off for 20 years. Quit about 8 years ago (as of 08/2015)  . Alcohol use 0.0 oz/week     Comment: occasionally     Colonoscopy:  PAP:  Bone density:   Allergies  Allergen Reactions  . Penicillins Shortness Of Breath and Rash    Has patient had a PCN reaction causing immediate rash, facial/tongue/throat swelling, SOB or lightheadedness with hypotension: Yes Has patient had a PCN reaction causing severe rash involving mucus membranes or skin necrosis: Yes Has patient had a PCN reaction that required hospitalization No Has patient had a PCN reaction occurring within the last 10 years: Yes If all of the above answers are "NO", then may proceed with Cephalosporin use.   . Clindamycin/Lincomycin Other (See Comments)    Severe stomach cramps  . Ace Inhibitors Cough    REACTION: cough; denies airway involvement     Current Outpatient Prescriptions  Medication Sig Dispense Refill  . aspirin 81 MG chewable tablet Chew 81 mg by mouth daily.    . Insulin Glargine (LANTUS SOLOSTAR) 100 UNIT/ML Solostar Pen Inject 60 Units into the skin every morning. And pen needles 1/day (Patient taking differently: Inject 50 Units into the skin every morning. And pen needles 1/day) 10 pen 11  . levothyroxine (SYNTHROID, LEVOTHROID) 150 MCG tablet TAKE 1 TABLET BY MOUTH DAILY BEFORE BREAKFAST 30 tablet 1  . metFORMIN (GLUCOPHAGE) 1000 MG tablet TAKE 1 TABLET BY MOUTH 2 TIMES DAILY WITH A MEAL 60 tablet 0  . Multiple Vitamin (MULTIVITAMIN WITH MINERALS) TABS tablet Take 1 tablet by mouth daily.    Marland Kitchen olmesartan (BENICAR) 20 MG tablet TAKE 1 TABLET BY MOUTH DAILY 30 tablet 10  . pantoprazole (PROTONIX) 40 MG tablet TAKE 1 TABLET BY MOUTH ONCE DAILY 90 tablet 2  . pregabalin (LYRICA) 75 MG capsule Take 1 capsule (75 mg total) by mouth 2 (two) times daily. 60 capsule 5  . rosuvastatin (CRESTOR) 10 MG tablet Take 1 tablet (10 mg total) by mouth daily. 90 tablet 3  . tamoxifen (NOLVADEX) 20 MG tablet Take 1 tablet (20  mg total) by mouth daily. 90 tablet 12   No current facility-administered medications for this visit.     OBJECTIVE: Middle-aged white woman Vitals:   03/06/16 0819  BP: 140/77  Pulse: (!) 104  Resp: 18  Temp: 98 F (36.7 C)     Body mass index is 44.13 kg/m.    ECOG FS:1 - Symptomatic but completely ambulatory  Sclerae unicteric, pupils round and equal Oropharynx clear and moist-- no thrush or other lesions No cervical or supraclavicular adenopathy Lungs no rales or rhonchi  Heart regular rate and rhythm Abd soft, nontender, positive bowel sounds MSK no focal spinal tenderness, no upper extremity lymphedema Neuro: nonfocal, well oriented, appropriate affect Breasts: The right breast is status post reduction mammoplasty. It is otherwise unremarkable. The left breast is status post lumpectomy as well as reduction and radiation. There is an area of significant induration in the lateral aspect of the breast extending superiorly and medially to above the nipple. This is postoperative induration, likely scar tissue, and I have encouraged her to keep an eye on this resume MRIs as the palpation and does not think this is new if it persists acid likely well. The left axilla is benign.   LAB RESULTS:  CMP     Component Value Date/Time   NA 138 09/30/2015 0942   NA 137 02/18/2013 1554   K 4.2 09/30/2015 0942   K 4.4 02/18/2013 1554   CL 105 09/30/2015 0942   CO2 24 09/30/2015 0942   CO2 25 02/18/2013 1554   GLUCOSE 190 (H) 09/30/2015 0942   GLUCOSE 300 (H) 02/18/2013 1554   BUN 11 09/30/2015 0942   BUN 14.2 02/18/2013 1554   CREATININE 0.48 09/30/2015 0942   CREATININE 0.8 02/18/2013 1554   CALCIUM 9.1 09/30/2015 0942   CALCIUM 9.2 02/18/2013 1554   PROT 7.5 07/20/2015 0858   PROT 7.2 02/18/2013 1554   ALBUMIN 4.1 07/20/2015 0858   ALBUMIN 3.6 02/18/2013 1554   AST 10 07/20/2015 0858   AST 10 02/18/2013 1554   ALT 12 07/20/2015 0858   ALT 16 02/18/2013 1554   ALKPHOS 74  07/20/2015 0858   ALKPHOS 85 02/18/2013 1554   BILITOT 0.5 07/20/2015 0858   BILITOT 0.33 02/18/2013 1554   GFRNONAA >60 09/30/2015 0942   GFRAA >60 09/30/2015 0942    INo results found for: SPEP, UPEP  Lab Results  Component Value Date   WBC 9.4 09/30/2015   NEUTROABS 6.7 (H) 02/18/2013   HGB 13.8 09/30/2015   HCT 42.9 09/30/2015   MCV 85.0 09/30/2015   PLT 288 09/30/2015      Chemistry      Component Value Date/Time   NA 138 09/30/2015 0942   NA 137 02/18/2013 1554   K 4.2 09/30/2015 0942   K 4.4 02/18/2013 1554   CL 105 09/30/2015 0942   CO2 24 09/30/2015 0942   CO2 25 02/18/2013 1554   BUN 11 09/30/2015 0942   BUN 14.2 02/18/2013 1554   CREATININE 0.48 09/30/2015 0942   CREATININE 0.8 02/18/2013 1554      Component Value Date/Time   CALCIUM 9.1 09/30/2015 0942   CALCIUM 9.2 02/18/2013 1554   ALKPHOS 74 07/20/2015 0858   ALKPHOS 85 02/18/2013 1554   AST 10 07/20/2015 0858   AST 10 02/18/2013 1554   ALT 12 07/20/2015 0858   ALT 16 02/18/2013 1554   BILITOT 0.5 07/20/2015 0858   BILITOT 0.33 02/18/2013 1554       No results found for: LABCA2  No components found for: LABCA125  No results for input(s): INR in the last 168 hours.  Urinalysis    Component Value Date/Time   COLORURINE YELLOW 03/15/2009 0835   APPEARANCEUR CLOUDY (A) 03/15/2009 0835   LABSPEC 1.024 03/15/2009 0835   PHURINE 7.5 03/15/2009 0835   GLUCOSEU NEGATIVE 03/15/2009 0835   GLUCOSEU NEGATIVE 01/09/2008 1524   HGBUR NEGATIVE 03/15/2009 0835   BILIRUBINUR NEGATIVE 03/15/2009 [address] 03/15/2009 0835   PROTEINUR NEGATIVE 03/15/2009 0835   UROBILINOGEN  1.0 03/15/2009 0835   NITRITE NEGATIVE 03/15/2009 0835   LEUKOCYTESUR TRACE (A) 03/15/2009 0835     STUDIES: No results found.  ELIGIBLE FOR AVAILABLE RESEARCH PROTOCOL: No  ASSESSMENT: 51 y.o. BRCA negative Stacey Roberts, [place] woman   (1) status post central left breast lumpectomy 01/06/2013 for a 1.5 cm ductal  carcinoma in situ, grade 1, estrogen receptor 99% positive, progesterone receptor 100% positive, with a focally positive margin  (2) status post additional surgery for margin clearance 01/22/2013 showed the posterior margin negative at 0.1 cm  (3) the patient decided against adjuvant radiation; she was supposed to have started tamoxifen May 2015,  (4) left breast upper outer quadrant biopsy 09/02/2015 finds ductal carcinoma in situ, grade 1, with not enough tissue for prognostic panel  (a) upper outer quadrant biopsy retroareolar biopsy 09/16/2015, found to fibroadipose tissue and fat necrosis but no evidence of malignancy  (5) genetics testing 09/21/2015 through the [facility] Common Hereditary Cancers Panel (Breast, Gyn, GI) performed by [facility], [place]) found no deleterious mutations in APC, ATM, AXIN2, BARD1, BMPR1A, BRCA1, BRCA2, BRIP1, CDH1, CDKN2A, CHEK2, DICER1, EPCAM, GREM1, KIT, MEN1, MLH1, MSH2, MSH6, MUTYH, NBN, NF1, PALB2, PDGFRA, PMS2, POLD1, POLE, PTEN, RAD50, RAD51C, RAD51D, SDHA, SDHB, SDHC, SDHD, SMAD4, SMARCA4, STK11, TP53, TSC1, TSC2, and VHL  (6) repeat left lumpectomy and sentinel node biopsyy 10/12/2015 documented a pT2  PN0, stage IIA  Invasive ductal carcinoma, grade 1, with a focally positive margin; estrogen and progesterone receptor positive, HER-2 nonamplified, with an MIB-1 of 3%.  (a)  Left breast reexcision with bilateral oncoplastic reconstruction 10/12/2015 achieved clear margins, with no evidence of malignancy  (7) Oncotype DX score of 3 predicts a risk of recurrence outside the breast in 10 years of 4% if the patient's only systemic therapy is tamoxifen for 5 years. It also predicts no benefit from chemotherapy.  (8) adjuvant radiation11/08/17-12/19/17 Site/dose:  1) Left breast DIBH / 50.4 Gy in 28 fractions                         2) Left breast boost / 12 Gy in 6 fractions  (8) tamoxifen started 03/06/2016  PLAN: I spent  approximately 30 minutes with total knee going over her prognosis and the need for systemic therapy. Since she did not receive chemotherapy, anti-estrogens will be her only systemic treatment. She should do very well with this, given her Oncotype score.  The problem is which antiestrogen to use. We discussed the difference between tamoxifen and anastrozole in detail. She understands that anastrozole and the aromatase inhibitors in general work by blocking estrogen production. Accordingly vaginal dryness, decrease in bone density, and of course hot flashes can result. The aromatase inhibitors can also negatively affect the cholesterol profile, although that is a minor effect. One out of 5 women on aromatase inhibitors we will feel "old and achy". This arthralgia/myalgia syndrome, which resembles fibromyalgia clinically, does resolve with stopping the medications. Accordingly this is not a reason to not try an aromatase inhibitor but it is a frequent reason to stop it (in other words 20% of women will not be able to tolerate these medications).  Tamoxifen on the other hand does not block estrogen production. It does not "take away a woman's estrogen". It blocks the estrogen receptor in breast cells. Like anastrozole, it can also cause hot flashes. As opposed to anastrozole, tamoxifen has many estrogen-like effects. It is technically an estrogen receptor modulator. This means that in  some tissues tamoxifen works like estrogen-- for example it helps strengthen the bones. It tends to improve the cholesterol profile. It can cause thickening of the endometrial lining, and even endometrial polyps or rarely cancer of the uterus.(The risk of uterine cancer due to tamoxifen is one additional cancer per thousand women year). It can cause vaginal wetness or stickiness. It can cause blood clots through this estrogen-like effect--the risk of blood clots with tamoxifen is exactly the same as with birth control pills or hormone  replacement.  Neither of these agents causes mood changes or weight gain, despite the popular belief that they can have these side effects. We have data from studies comparing either of these drugs with placebo, and in those cases the control group had the same amount of weight gain and depression as the group that took the drug.  Given her desire to continue the Mirena IUD and not to undergo a hysterectomy, we decided to give tamoxifen a try. She has had problems with this in the past, but I emphasized the placebo controlled studies which showed tamoxifen does not cause depression or weight gain. I went ahead and placed prescription and I will see her again in May. She tolerates this well plan will be to do this for 5 years. She does not tolerate it we will have to reconsider the more complex option of anastrozole, take going off the Mirena IUD, and considering Hysterectomy  I reassured her regarding her wheezing since the exam today was so normal, but we will obtain a chest x-ray just to make 100% sure.  She knows to call for any problems that may develop before her next visit here.  Chauncey Cruel, MD   03/06/2016 8:55 AM Medical Oncology and Hematology [facility] [address] [place], [place] [address] Tel. [phone]    Fax. [phone]

## 2016-03-06 ENCOUNTER — Encounter: Payer: Self-pay | Admitting: Oncology

## 2016-03-06 ENCOUNTER — Ambulatory Visit (HOSPITAL_COMMUNITY)
Admission: RE | Admit: 2016-03-06 | Discharge: 2016-03-06 | Disposition: A | Discharge status: Home / Self Care | Payer: 59 | Source: Ambulatory Visit | Attending: Oncology | Admitting: Oncology

## 2016-03-06 ENCOUNTER — Ambulatory Visit (HOSPITAL_BASED_OUTPATIENT_CLINIC_OR_DEPARTMENT_OTHER): Payer: 59 | Admitting: Oncology

## 2016-03-06 ENCOUNTER — Other Ambulatory Visit: Payer: Self-pay | Admitting: *Deleted

## 2016-03-06 VITALS — BP 140/77 | HR 104 | Temp 98.0°F | Resp 18 | Ht 64.0 in | Wt 257.1 lb

## 2016-03-06 DIAGNOSIS — Z170 Estrogen receptor positive status [ER+]: Secondary | ICD-10-CM

## 2016-03-06 DIAGNOSIS — R062 Wheezing: Secondary | ICD-10-CM | POA: Insufficient documentation

## 2016-03-06 DIAGNOSIS — R059 Cough, unspecified: Secondary | ICD-10-CM

## 2016-03-06 DIAGNOSIS — C73 Malignant neoplasm of thyroid gland: Secondary | ICD-10-CM | POA: Diagnosis not present

## 2016-03-06 DIAGNOSIS — Z853 Personal history of malignant neoplasm of breast: Secondary | ICD-10-CM

## 2016-03-06 DIAGNOSIS — R918 Other nonspecific abnormal finding of lung field: Secondary | ICD-10-CM | POA: Diagnosis not present

## 2016-03-06 DIAGNOSIS — R05 Cough: Secondary | ICD-10-CM | POA: Insufficient documentation

## 2016-03-06 DIAGNOSIS — C50412 Malignant neoplasm of upper-outer quadrant of left female breast: Secondary | ICD-10-CM

## 2016-03-06 DIAGNOSIS — C50112 Malignant neoplasm of central portion of left female breast: Secondary | ICD-10-CM

## 2016-03-06 DIAGNOSIS — R0602 Shortness of breath: Secondary | ICD-10-CM | POA: Diagnosis not present

## 2016-03-06 DIAGNOSIS — I700 Atherosclerosis of aorta: Secondary | ICD-10-CM | POA: Diagnosis not present

## 2016-03-06 MED ORDER — TAMOXIFEN CITRATE 20 MG PO TABS: 20.0000 mg | ORAL_TABLET | Freq: Every day | ORAL | 12 refills | Status: DC

## 2016-03-09 ENCOUNTER — Ambulatory Visit: Payer: 59 | Admitting: Radiation Oncology

## 2016-03-14 ENCOUNTER — Encounter: Payer: Self-pay | Admitting: Endocrinology

## 2016-03-14 ENCOUNTER — Ambulatory Visit (INDEPENDENT_AMBULATORY_CARE_PROVIDER_SITE_OTHER): Payer: 59 | Admitting: Endocrinology

## 2016-03-14 VITALS — BP 126/64 | HR 109 | Ht 64.0 in | Wt 257.0 lb

## 2016-03-14 DIAGNOSIS — Z794 Long term (current) use of insulin: Secondary | ICD-10-CM

## 2016-03-14 DIAGNOSIS — E890 Postprocedural hypothyroidism: Secondary | ICD-10-CM | POA: Diagnosis not present

## 2016-03-14 DIAGNOSIS — E1142 Type 2 diabetes mellitus with diabetic polyneuropathy: Secondary | ICD-10-CM | POA: Diagnosis not present

## 2016-03-14 LAB — POCT GLYCOSYLATED HEMOGLOBIN (HGB A1C): HEMOGLOBIN A1C: 10

## 2016-03-14 MED ORDER — INSULIN GLARGINE 100 UNIT/ML SOLOSTAR PEN: 80.0000 [IU] | PEN_INJECTOR | SUBCUTANEOUS | 11 refills | Status: DC

## 2016-03-14 NOTE — Patient Instructions (Addendum)
check your blood sugar once a day.  vary the time of day when you check, between before the 3 meals, and at bedtime.  also check if you have symptoms of your blood sugar being too high or too low.  please keep a record of the readings and bring it to your next appointment here.  You can write it on any piece of paper.  please call us sooner if your blood sugar goes below 70, or if you have a lot of readings over 200.   Please increase the lantus to 80 units each morning.   Please come back for a follow-up appointment in 3 months.

## 2016-03-14 NOTE — Progress Notes (Signed)
Subjective:    Patient ID: Stacey Roberts, female    DOB: [birth year], 51 y.o.   MRN: [id number]  HPI Pt has stage-1 papillary adenocarcinoma of the thyroid.    2/11: thyroidectomy: T1b N0 M0.  4/11: RAI 103 mci, with thyrogen.  12/11: neck US: single enlarged right cervical lymph node, nonspecific.   6/12: body scan (thyrogen) neg.   12/12: [place]: lymph node is stable.  6/13: [place]: overall node morphology and volume show very little change.   9/14  TG undetectable (ab neg) 3/15: TG undetectable (ab neg) 10/15 TG undetectable (ab neg).  3/17 TG undetectable (ab neg).   Pt also returns for f/u of diabetes mellitus: DM type: Insulin-requiring type 2 Dx'ed: [id number] Complications: painful neuropathy of the lower extremities Therapy: insulin since early 2017 (also byetta and 2 oral meds).   GDM: never DKA: never Severe hypoglycemia: never.   Pancreatitis: never.  Other: she declines multiple daily injections  she declines weight-loss surgery; edema precudes pioglitizone rx.  Interval history: No recent steroids.  no cbg record, but states cbg's are in the high-200's.  pt states she feels well in general.  Past Medical History:  Diagnosis Date  . Allergy    seasonal  . Bronchitis    hx of  . Diabetes mellitus, type II ([place])   . GERD (gastroesophageal reflux disease)   . Headache   . Hyperlipidemia   . Hypertension   . Hypothyroidism   . Obesity   . Pneumonia    as a child  . Sleep apnea 2008   severe OSA-does not use a cpap  . Snoring   . 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 Buddy Duty    Past Surgical History:  Procedure Laterality Date  . BREAST BIOPSY  2000   s/p  . BREAST BIOPSY Left 01/22/2013   Procedure: RE-EXCISION LEFT BREAST DCIS;  Surgeon: Harl [place], MD;  Location: [facility];  Service: General;  Laterality: Left;  . BREAST LUMPECTOMY  Left 01/06/2013   Procedure: LUMPECTOMY;  Surgeon: Harl [place], MD;  Location: [facility];  Service: General;  Laterality: Left;  . BREAST LUMPECTOMY WITH NEEDLE LOCALIZATION AND AXILLARY SENTINEL LYMPH NODE BX Left 09/30/2015   Procedure: Left BREAST LUMPECTOMY WITH NEEDLE LOCALIZATION AND AXILLARY SENTINEL LYMPH NODE BX;  Surgeon: Coralie Keens, MD;  Location: [facility];  Service: General;  Laterality: Left;  . BREAST RECONSTRUCTION Bilateral 10/12/2015   Procedure: BREAST oncoplastic RECONSTRUCTION;  Surgeon: Irene Limbo, MD;  Location: [facility];  Service: Plastics;  Laterality: Bilateral;  . BREAST REDUCTION SURGERY Bilateral 10/12/2015   Procedure: MAMMARY REDUCTION  (BREAST);  Surgeon: Irene Limbo, MD;  Location: [facility];  Service: Plastics;  Laterality: Bilateral;  . DILATION AND CURETTAGE OF UTERUS  2004   s/p  . EXCISION OF BREAST LESION Left 10/12/2015   Procedure: Re-EXCISION OF BREAST;  Surgeon: Coralie Keens, MD;  Location: [facility];  Service: General;  Laterality: Left;  . THYROIDECTOMY  02/2009   Dr Harlow Asa  . TONSILLECTOMY  1982    Social History   Social History  . Marital status: Single    Spouse name: N/A  . Number of children: N/A  . Years of education: N/A   Occupational History  .  [employer]    Outpatient scheduling in radiology   Social History Main Topics  .  Smoking status: Former Smoker    Packs/day: 0.50    Types: Cigarettes    Quit date: 09/29/2007  . Smokeless tobacco: Never Used     Comment: Smoked on and off for 20 years. Quit about 8 years ago (as of 08/2015)  . Alcohol use 0.0 oz/week     Comment: occasionally  . Drug use: No  . Sexual activity: Yes   Other Topics Concern  . Not on file   Social History Narrative   The patient is divorced, moved to [place] in   2006 from [place].  She does not have any children, currently lives   with her boyfriend and is working as a  Nurse, adult for [employer].    No alcohol.  Tobacco use:  She quit 5 years ago.  She smoked on   and off for approximately 20 years.  No history of recreational drug   Use.Drinks two cups of coffee per work day.     Current Outpatient Prescriptions on File Prior to Visit  Medication Sig Dispense Refill  . aspirin 81 MG chewable tablet Chew 81 mg by mouth daily.    Marland Kitchen levothyroxine (SYNTHROID, LEVOTHROID) 150 MCG tablet TAKE 1 TABLET BY MOUTH DAILY BEFORE BREAKFAST 30 tablet 1  . metFORMIN (GLUCOPHAGE) 1000 MG tablet TAKE 1 TABLET BY MOUTH 2 TIMES DAILY WITH A MEAL 60 tablet 0  . Multiple Vitamin (MULTIVITAMIN WITH MINERALS) TABS tablet Take 1 tablet by mouth daily.    Marland Kitchen olmesartan (BENICAR) 20 MG tablet TAKE 1 TABLET BY MOUTH DAILY 30 tablet 10  . pantoprazole (PROTONIX) 40 MG tablet TAKE 1 TABLET BY MOUTH ONCE DAILY 90 tablet 2  . pregabalin (LYRICA) 75 MG capsule Take 1 capsule (75 mg total) by mouth 2 (two) times daily. 60 capsule 5  . rosuvastatin (CRESTOR) 10 MG tablet Take 1 tablet (10 mg total) by mouth daily. 90 tablet 3  . tamoxifen (NOLVADEX) 20 MG tablet Take 1 tablet (20 mg total) by mouth daily. 90 tablet 12  . [DISCONTINUED] Calcium Carbonate 1500 MG TABS Take 1,500 mg by mouth daily.       No current facility-administered medications on file prior to visit.     Allergies  Allergen Reactions  . Penicillins Shortness Of Breath and Rash    Has patient had a PCN reaction causing immediate rash, facial/tongue/throat swelling, SOB or lightheadedness with hypotension: Yes Has patient had a PCN reaction causing severe rash involving mucus membranes or skin necrosis: Yes Has patient had a PCN reaction that required hospitalization No Has patient had a PCN reaction occurring within the last 10 years: Yes If all of the above answers are "NO", then may proceed with Cephalosporin use.   . Clindamycin/Lincomycin Other (See Comments)    Severe stomach cramps  . Ace  Inhibitors Cough    REACTION: cough; denies airway involvement     Family History  Problem Relation Age of Onset  . Dementia Father     deceased age 74 secondary to dementia  . Bipolar disorder Father   . Emphysema Father   . Heart disease Father   . COPD Father     smoker  . CAD Mother 30    Died age 23 of CAD  . Heart disease Mother   . Graves' disease Mother   . CAD Maternal Grandfather 60  . Heart disease Maternal Grandfather   . Other Sister 106    hysterectomy for fibroids  . Prostate cancer Brother  low grade; w/ surveillance  . Thyroid nodules Brother 72  . Fibroids Other     niece dx approx 69  . Kidney cancer Cousin     maternal 1st cousin dx 74-47; former smoker  . Lung cancer Other     maternal great aunt (MGM's sister); not a smoker  . Cancer Other     nephew dx neuroblastoma at 59.30 years old  . Colon cancer Neg Hx   . Colon polyps Neg Hx     BP 126/64   Pulse (!) 109   Ht 5\' 4"  (1.626 m)   Wt 257 lb (116.6 kg)   SpO2 91%   BMI 44.11 kg/m    Review of Systems She denies hypoglycemia.  She has gained a few lbs.      Objective:   Physical Exam VITAL SIGNS:  See vs page.  GENERAL: no distress.  Neck: a healed scar is present.  I do not appreciate a nodule in the thyroid or elsewhere in the neck.   Pulses: dorsalis pedis intact bilat.  MSK: no deformity of the feet.   CV: trace bilat leg edema.   Skin:  no ulcer on the feet.  normal color and temp on the feet.  Neuro: sensation is intact to touch on the feet.    A1c=10.0%    Assessment & Plan:  Insulin-requiring type 2 DM, with polyneuropathy: worse. Thyroid cancer: no clinical evidence of recurrence.   Patient is advised the following: Patient Instructions  check your blood sugar once a day.  vary the time of day when you check, between before the 3 meals, and at bedtime.  also check if you have symptoms of your blood sugar being too high or too low.  please keep a record of the readings  and bring it to your next appointment here.  You can write it on any piece of paper.  please call us sooner if your blood sugar goes below 70, or if you have a lot of readings over 200.   Please increase the lantus to 80 units each morning.   Please come back for a follow-up appointment in 3 months.

## 2016-03-27 ENCOUNTER — Other Ambulatory Visit: Payer: Self-pay | Admitting: Endocrinology

## 2016-04-14 ENCOUNTER — Telehealth: Payer: Self-pay | Admitting: Adult Health

## 2016-04-14 NOTE — Telephone Encounter (Signed)
Scheduled appt for Survivorship Visit per Humberto Leep - Patient notified.

## 2016-04-19 ENCOUNTER — Other Ambulatory Visit: Payer: Self-pay | Admitting: Endocrinology

## 2016-04-25 ENCOUNTER — Other Ambulatory Visit: Payer: Self-pay | Admitting: Endocrinology

## 2016-05-02 ENCOUNTER — Other Ambulatory Visit: Payer: Self-pay | Admitting: Oncology

## 2016-05-05 ENCOUNTER — Other Ambulatory Visit: Payer: Self-pay | Admitting: Endocrinology

## 2016-05-05 ENCOUNTER — Other Ambulatory Visit: Payer: Self-pay | Admitting: Podiatry

## 2016-05-05 NOTE — Telephone Encounter (Signed)
Lyrica refill called to [place] [facility].

## 2016-05-08 ENCOUNTER — Other Ambulatory Visit: Payer: Self-pay | Admitting: Endocrinology

## 2016-05-19 ENCOUNTER — Encounter: Payer: Self-pay | Admitting: Radiation Oncology

## 2016-05-19 ENCOUNTER — Encounter: Payer: Self-pay | Admitting: Oncology

## 2016-05-19 NOTE — Progress Notes (Signed)
Paperwork [facility]) received 05/19/16, given to nurse

## 2016-05-22 ENCOUNTER — Encounter: Payer: Self-pay | Admitting: Radiation Oncology

## 2016-05-22 NOTE — Progress Notes (Signed)
Paperwork [facility]) faxed to [place] @ [phone], confirmation received, copy given to patient 05/22/16

## 2016-05-23 ENCOUNTER — Ambulatory Visit
Admission: RE | Admit: 2016-05-23 | Discharge: 2016-05-23 | Disposition: A | Discharge status: Home / Self Care | Payer: 59 | Source: Ambulatory Visit | Attending: Oncology | Admitting: Oncology

## 2016-05-23 DIAGNOSIS — C50112 Malignant neoplasm of central portion of left female breast: Secondary | ICD-10-CM

## 2016-05-23 DIAGNOSIS — R059 Cough, unspecified: Secondary | ICD-10-CM

## 2016-05-23 DIAGNOSIS — R05 Cough: Secondary | ICD-10-CM

## 2016-05-23 DIAGNOSIS — R928 Other abnormal and inconclusive findings on diagnostic imaging of breast: Secondary | ICD-10-CM | POA: Diagnosis not present

## 2016-05-23 DIAGNOSIS — C73 Malignant neoplasm of thyroid gland: Secondary | ICD-10-CM

## 2016-05-23 DIAGNOSIS — Z170 Estrogen receptor positive status [ER+]: Principal | ICD-10-CM

## 2016-05-23 DIAGNOSIS — R062 Wheezing: Secondary | ICD-10-CM

## 2016-05-23 HISTORY — DX: Malignant neoplasm of unspecified site of unspecified female breast: C50.919

## 2016-05-25 ENCOUNTER — Other Ambulatory Visit: Payer: Self-pay

## 2016-05-25 ENCOUNTER — Ambulatory Visit (INDEPENDENT_AMBULATORY_CARE_PROVIDER_SITE_OTHER): Payer: 59

## 2016-05-25 DIAGNOSIS — C73 Malignant neoplasm of thyroid gland: Secondary | ICD-10-CM

## 2016-05-25 DIAGNOSIS — Z170 Estrogen receptor positive status [ER+]: Secondary | ICD-10-CM

## 2016-05-25 DIAGNOSIS — C50112 Malignant neoplasm of central portion of left female breast: Secondary | ICD-10-CM

## 2016-05-25 DIAGNOSIS — M79644 Pain in right finger(s): Secondary | ICD-10-CM

## 2016-05-25 DIAGNOSIS — M7989 Other specified soft tissue disorders: Secondary | ICD-10-CM | POA: Diagnosis not present

## 2016-05-25 DIAGNOSIS — M79641 Pain in right hand: Secondary | ICD-10-CM | POA: Diagnosis not present

## 2016-05-29 ENCOUNTER — Emergency Department
Admission: EM | Admit: 2016-05-29 | Discharge: 2016-05-29 | Disposition: A | Discharge status: Home / Self Care | Payer: 59 | Source: Home / Self Care | Attending: Family Medicine | Admitting: Family Medicine

## 2016-05-29 ENCOUNTER — Encounter: Payer: Self-pay | Admitting: *Deleted

## 2016-05-29 DIAGNOSIS — J029 Acute pharyngitis, unspecified: Secondary | ICD-10-CM

## 2016-05-29 MED ORDER — CEPHALEXIN 500 MG PO CAPS: 500.0000 mg | ORAL_CAPSULE | Freq: Two times a day (BID) | ORAL | 0 refills | Status: DC

## 2016-05-29 NOTE — Discharge Instructions (Signed)
If cold symptoms develop, try the following: Take plain guaifenesin (1200mg  extended release tabs such as Mucinex) twice daily, with plenty of water, for cough and congestion.  Get adequate rest.   May use Afrin nasal spray (or generic oxymetazoline) each morning for about 5 days and then discontinue.  Also recommend using saline nasal spray several times daily and saline nasal irrigation (AYR is a common brand).  Use Flonase nasal spray each morning after using Afrin nasal spray and saline nasal irrigation. Try warm salt water gargles for sore throat.  Stop all antihistamines for now, and other non-prescription cough/cold preparations. May take Ibuprofen 200mg , 4 tabs every 8 hours with food for sore throat, body aches, etc. Begin Keflex if throat culture positive, if not improving about one week or if persistent fever develops   Follow-up with family doctor if not improving about 7 to 10 days.

## 2016-05-29 NOTE — ED Triage Notes (Signed)
Pt c/o sore throat x 0300 today. Denies fever.

## 2016-05-29 NOTE — ED Provider Notes (Signed)
[facility]    CSN: [id number] Arrival date & time: 05/29/16  0819     History   Chief Complaint Chief Complaint  Patient presents with  . Sore Throat    HPI Stacey Roberts is a 51 y.o. female.   Patient awoke this morning with sore throat, fatigue, and minimal cough.  She has been exposed to several family members with strep pharyngitis.   The history is provided by the patient.    Past Medical History:  Diagnosis Date  . Allergy    seasonal  . Breast cancer ([place]) 2014  . Breast cancer ([place]) 2017  . Bronchitis    hx of  . Diabetes mellitus, type II ([place])   . GERD (gastroesophageal reflux disease)   . Headache   . History of radiation therapy 12/01/15-01/11/16   Left breast DIBH / 50.4 Gy in 28 fractions and Left breast boost / 12 Gy in 6 fractions  . Hyperlipidemia   . Hypertension   . Hypothyroidism   . Obesity   . Pneumonia    as a child  . Sleep apnea 2008   severe OSA-does not use a cpap  . Snoring   . 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 Buddy Duty    Patient Active Problem List   Diagnosis Date Noted  . Genetic testing 11/22/2015  . GERD (gastroesophageal reflux disease) 07/20/2015  . Diabetes ([place]) 04/03/2015  . Anxiety and depression 09/09/2013  . Malignant neoplasm of central portion of left breast in female, estrogen receptor positive ([place]) 02/05/2013  . 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 ADENOCARCINOMA, THYROID GLAND, 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  . Dyslipidemia 12/18/2006  . Essential hypertension 12/13/2006    Past Surgical History:  Procedure Laterality Date  . BREAST BIOPSY  2000   s/p  . BREAST BIOPSY Left 01/22/2013   Procedure: RE-EXCISION LEFT BREAST DCIS;  Surgeon: Harl [place], MD;  Location: [facility];  Service: General;  Laterality: Left;  . BREAST LUMPECTOMY Left 01/06/2013   Procedure:  LUMPECTOMY;  Surgeon: Harl [place], MD;  Location: [facility];  Service: General;  Laterality: Left;  . BREAST LUMPECTOMY Left 2017  . BREAST LUMPECTOMY WITH NEEDLE LOCALIZATION AND AXILLARY SENTINEL LYMPH NODE BX Left 09/30/2015   Procedure: Left BREAST LUMPECTOMY WITH NEEDLE LOCALIZATION AND AXILLARY SENTINEL LYMPH NODE BX;  Surgeon: Coralie Keens, MD;  Location: [place];  Service: General;  Laterality: Left;  . BREAST RECONSTRUCTION Bilateral 10/12/2015   Procedure: BREAST oncoplastic RECONSTRUCTION;  Surgeon: Irene Limbo, MD;  Location: [facility];  Service: Plastics;  Laterality: Bilateral;  . BREAST REDUCTION SURGERY Bilateral 10/12/2015   Procedure: MAMMARY REDUCTION  (BREAST);  Surgeon: Irene Limbo, MD;  Location: [place];  Service: Plastics;  Laterality: Bilateral;  . DILATION AND CURETTAGE OF UTERUS  2004   s/p  . EXCISION OF BREAST LESION Left 10/12/2015   Procedure: Re-EXCISION OF BREAST;  Surgeon: Coralie [place]ens, MD;  Location: [place];  Service: General;  Laterality: Left;  . REDUCTION MAMMAPLASTY Bilateral 09/2015  . THYROIDECTOMY  02/2009   Dr Harlow Asa  . TONSILLECTOMY  1982    OB History    No data available       Home Medications    Prior to Admission medications   Medication Sig Start Date End Date Taking? Authorizing Provider  aspirin 81 MG chewable tablet Chew 81 mg  by mouth daily.    [provider]  cephALEXin (KEFLEX) 500 MG capsule Take 1 capsule (500 mg total) by mouth 2 (two) times daily. (Rx void after 06/06/16) 05/29/16   Kandra Nicolas, MD  Insulin Glargine (LANTUS SOLOSTAR) 100 UNIT/ML Solostar Pen Inject 80 Units into the skin every morning. And pen needles 1/day 03/14/16   Renato Shin, MD  levothyroxine (SYNTHROID, LEVOTHROID) 150 MCG tablet TAKE 1 TABLET BY MOUTH DAILY BEFORE BREAKFAST 05/05/16   Renato Shin, MD  LYRICA 75 MG capsule TAKE 1 CAPSULE BY MOUTH TWICE A DAY 05/05/16    Hyatt, Max T, DPM  metFORMIN (GLUCOPHAGE) 1000 MG tablet TAKE 1 TABLET BY MOUTH 2 TIMES DAILY WITH A MEAL 05/08/16   Renato Shin, MD  Multiple Vitamin (MULTIVITAMIN WITH MINERALS) TABS tablet Take 1 tablet by mouth daily.    [provider]  olmesartan (BENICAR) 20 MG tablet TAKE 1 TABLET BY MOUTH DAILY 09/07/15   Jearld Fenton, NP  pantoprazole (PROTONIX) 40 MG tablet TAKE 1 TABLET BY MOUTH ONCE DAILY 08/06/15   Jearld Fenton, NP  rosuvastatin (CRESTOR) 10 MG tablet TAKE 1 TABLET (10 MG TOTAL) BY MOUTH DAILY. 04/19/16   Renato Shin, MD  TRUE METRIX BLOOD GLUCOSE TEST test strip USE AS DIRECTED EACH DAY 04/25/16   Renato Shin, MD    Family History Family History  Problem Relation Age of Onset  . Dementia Father     deceased age 78 secondary to dementia  . Bipolar disorder Father   . Emphysema Father   . Heart disease Father   . COPD Father     smoker  . CAD Mother 30    Died age 73 of CAD  . Heart disease Mother   . Graves' disease Mother   . CAD Maternal Grandfather 60  . Heart disease Maternal Grandfather   . Other Sister 65    hysterectomy for fibroids  . Prostate cancer Brother     low grade; w/ surveillance  . Thyroid nodules Brother 8  . Fibroids Other     niece dx approx 75  . Kidney cancer Cousin     maternal 1st cousin dx 59-47; former smoker  . Lung cancer Other     maternal great aunt (MGM's sister); not a smoker  . Cancer Other     nephew dx neuroblastoma at 36.56 years old  . Colon cancer Neg Hx   . Colon polyps Neg Hx     Social History Social History  Substance Use Topics  . Smoking status: Former Smoker    Packs/day: 0.50    Types: Cigarettes    Quit date: 09/29/2007  . Smokeless tobacco: Never Used     Comment: Smoked on and off for 20 years. Quit about 8 years ago (as of 08/2015)  . Alcohol use 0.0 oz/week     Comment: occasionally     Allergies   Penicillins; Clindamycin/lincomycin; and Ace inhibitors   Review of Systems Review  of Systems + sore throat + cough No pleuritic pain No wheezing No nasal congestion ? post-nasal drainage No sinus pain/pressure No itchy/red eyes No earache No hemoptysis No SOB No fever, + chills No nausea No vomiting No abdominal pain No diarrhea No urinary symptoms No skin rash + fatigue No myalgias No headache Used OTC meds without relief   Physical Exam Triage Vital Signs ED Triage Vitals  Enc Vitals Group     BP 05/29/16 0850 129/79  Pulse Rate 05/29/16 0850 86     Resp 05/29/16 0850 18     Temp 05/29/16 0850 98.8 F (37.1 C)     Temp Source 05/29/16 0850 Oral     SpO2 05/29/16 0850 97 %     Weight 05/29/16 0851 241 lb (109.3 kg)     Height 05/29/16 0851 5\' 4"  (1.626 m)     Head Circumference --      Peak Flow --      Pain Score 05/29/16 0851 0     Pain Loc --      Pain Edu? --      Excl. in [place]? --    No data found.   Updated Vital Signs BP 129/79 (BP Location: Left Arm)   Pulse 86   Temp 98.8 F (37.1 C) (Oral)   Resp 18   Ht 5\' 4"  (1.626 m)   Wt 241 lb (109.3 kg)   SpO2 97%   BMI 41.37 kg/m   Visual Acuity Right Eye Distance:   Left Eye Distance:   Bilateral Distance:    Right Eye Near:   Left Eye Near:    Bilateral Near:     Physical Exam Nursing notes and Vital Signs reviewed. Appearance:  Patient appears stated age, and in no acute distress Eyes:  Pupils are equal, round, and reactive to light and accomodation.  Extraocular movement is intact.  Conjunctivae are not inflamed  Ears:  Canals normal.  Tympanic membranes normal.  Nose:  Mildly congested turbinates.  No sinus tenderness.   Pharynx:  Uvula slightly swollen and erythematous. Neck:  Supple.  Slightly tender enlarged posterior/lateral nodes are palpated bilaterally.  Tonsillar nodes are prominent. Lungs:  Clear to auscultation.  Breath sounds are equal.  Moving air well. Heart:  Regular rate and rhythm without murmurs, rubs, or gallops.  Abdomen:  Nontender without  masses or hepatosplenomegaly.  Bowel sounds are present.  No CVA or flank tenderness.  Extremities:  No edema.  Skin:  No rash present.    UC Treatments / Results  Labs (all labs ordered are listed, but only abnormal results are displayed) Labs Reviewed  STREP A DNA PROBE  POCT RAPID STREP A (OFFICE) negative    EKG  EKG Interpretation None       Radiology No results found.  Procedures Procedures (including critical care time)  Medications Ordered in UC Medications - No data to display   Initial Impression / Assessment and Plan / UC Course  I have reviewed the triage vital signs and the nursing notes.  Pertinent labs & imaging results that were available during my care of the patient were reviewed by me and considered in my medical decision making (see chart for details).    Suspect early viral URI; however, patient has been exposed to several family members with strep pharyngitis. Throat culture pending. If cold symptoms develop, try the following: Take plain guaifenesin (1200mg  extended release tabs such as Mucinex) twice daily, with plenty of water, for cough and congestion.  Get adequate rest.   May use Afrin nasal spray (or generic oxymetazoline) each morning for about 5 days and then discontinue.  Also recommend using saline nasal spray several times daily and saline nasal irrigation (AYR is a common brand).  Use Flonase nasal spray each morning after using Afrin nasal spray and saline nasal irrigation. Try warm salt water gargles for sore throat.  Stop all antihistamines for now, and other non-prescription cough/cold preparations. May take Ibuprofen 200mg ,  4 tabs every 8 hours with food for sore throat, body aches, etc. Begin Keflex if throat culture positive, if not improving about one week or if persistent fever develops (Given a prescription to hold, with an expiration date)  Follow-up with family doctor if not improving about 7 to 10 days.     Final Clinical  Impressions(s) / UC Diagnoses   Final diagnoses:  Pharyngitis, unspecified etiology    New Prescriptions New Prescriptions   CEPHALEXIN (KEFLEX) 500 MG CAPSULE    Take 1 capsule (500 mg total) by mouth 2 (two) times daily. (Rx void after 06/06/16)     Kandra Nicolas, MD 05/29/16 [phone]

## 2016-05-30 ENCOUNTER — Telehealth: Payer: Self-pay | Admitting: *Deleted

## 2016-05-30 LAB — STREP A DNA PROBE: GASP: NOT DETECTED

## 2016-05-30 NOTE — Telephone Encounter (Signed)
Pt advised of negative Tcx. She will call back if she has any questions or concerns.

## 2016-05-31 ENCOUNTER — Other Ambulatory Visit: Payer: 59

## 2016-05-31 ENCOUNTER — Ambulatory Visit: Payer: 59 | Admitting: Oncology

## 2016-06-12 ENCOUNTER — Encounter: Payer: Self-pay | Admitting: Family Medicine

## 2016-06-12 ENCOUNTER — Ambulatory Visit (INDEPENDENT_AMBULATORY_CARE_PROVIDER_SITE_OTHER): Payer: 59 | Admitting: Family Medicine

## 2016-06-12 ENCOUNTER — Ambulatory Visit (HOSPITAL_BASED_OUTPATIENT_CLINIC_OR_DEPARTMENT_OTHER)
Admission: RE | Admit: 2016-06-12 | Discharge: 2016-06-12 | Disposition: A | Discharge status: Home / Self Care | Payer: 59 | Source: Ambulatory Visit | Attending: Family Medicine | Admitting: Family Medicine

## 2016-06-12 VITALS — BP 115/79 | HR 81 | Ht 64.0 in

## 2016-06-12 DIAGNOSIS — S79911A Unspecified injury of right hip, initial encounter: Secondary | ICD-10-CM | POA: Diagnosis not present

## 2016-06-12 DIAGNOSIS — M25551 Pain in right hip: Secondary | ICD-10-CM

## 2016-06-12 DIAGNOSIS — S76311A Strain of muscle, fascia and tendon of the posterior muscle group at thigh level, right thigh, initial encounter: Secondary | ICD-10-CM

## 2016-06-12 MED ORDER — OXYCODONE-ACETAMINOPHEN 7.5-325 MG PO TABS: 1.0000 | ORAL_TABLET | ORAL | 0 refills | Status: DC | PRN

## 2016-06-12 MED FILL — OXYCODON-ACETAMINOPHEN 7.5-: 7.5-325 | 5 days supply | Qty: 30 | Fill #0

## 2016-06-12 NOTE — Patient Instructions (Addendum)
You have a severe proximal hamstring strain. Ice the area 15 minutes at a time 3-4 times a day at least. Aleve 2 tabs twice a day with food for pain and inflammation. May take tylenol 500mg  1-2 tabs three times a day as well during the day. Percocet as needed for severe pain - no driving or working on this;  Note that percocet has 325mg  of tylenol in it. You cannot take more than 4000mg  of tylenol in a day. Crutches to help take pressure off this leg as well. Compression shorts may be helpful. Follow up in 1 week for reevaluation, ultrasound.

## 2016-06-14 ENCOUNTER — Encounter: Payer: Self-pay | Admitting: Endocrinology

## 2016-06-14 ENCOUNTER — Ambulatory Visit (INDEPENDENT_AMBULATORY_CARE_PROVIDER_SITE_OTHER): Payer: 59 | Admitting: Endocrinology

## 2016-06-14 VITALS — BP 136/86 | HR 81 | Ht 64.0 in | Wt 246.0 lb

## 2016-06-14 DIAGNOSIS — Z794 Long term (current) use of insulin: Secondary | ICD-10-CM | POA: Diagnosis not present

## 2016-06-14 DIAGNOSIS — C73 Malignant neoplasm of thyroid gland: Secondary | ICD-10-CM

## 2016-06-14 DIAGNOSIS — E1142 Type 2 diabetes mellitus with diabetic polyneuropathy: Secondary | ICD-10-CM

## 2016-06-14 LAB — POCT GLYCOSYLATED HEMOGLOBIN (HGB A1C): HEMOGLOBIN A1C: 8.1

## 2016-06-14 MED ORDER — INSULIN GLARGINE 100 UNIT/ML SOLOSTAR PEN: 40.0000 [IU] | PEN_INJECTOR | SUBCUTANEOUS | 11 refills | Status: DC

## 2016-06-14 NOTE — Progress Notes (Signed)
Subjective:    Patient ID: Stacey Roberts, female    DOB: 1965-07-30, 51 y.o.   MRN: [id number]  HPI Pt has stage-1 papillary adenocarcinoma of the thyroid.    2/11: thyroidectomy: T1b N0 M0.  4/11: RAI 103 mci, with thyrogen.  12/11: neck US: single enlarged right cervical lymph node, nonspecific.   6/12: body scan (thyrogen) neg.   12/12: [place]: lymph node is stable.  6/13: [place]: overall node morphology and volume show very little change.   9/14  TG undetectable (ab neg) 3/15: TG undetectable (ab neg) 10/15 TG undetectable (ab neg).  3/17 TG undetectable (ab neg).   Pt also returns for f/u of diabetes mellitus: DM type: Insulin-requiring type 2 Dx'ed: 9417 Complications: painful neuropathy of the lower extremities Therapy: insulin since early 2017 (also byetta and 2 oral meds).   GDM: never DKA: never Severe hypoglycemia: never.   Pancreatitis: never.  Other: she declines multiple daily injections  she declines weight-loss surgery; edema precudes pioglitizone rx.  Interval history: She has lost 18 lbs so far.  She takes a varying dosage of lantus (averages 40 units each morning--takes according to cbg's).  pt states she feels well in general.   Past Medical History:  Diagnosis Date  . Allergy    seasonal  . Breast cancer ([place]) 2014  . Breast cancer ([place]) 2017  . Bronchitis    hx of  . Diabetes mellitus, type II ([place])   . GERD (gastroesophageal reflux disease)   . Headache   . History of radiation therapy 12/01/15-01/11/16   Left breast DIBH / 50.4 Gy in 28 fractions and Left breast boost / 12 Gy in 6 fractions  . Hyperlipidemia   . Hypertension   . Hypothyroidism   . Obesity   . Pneumonia    as a child  . Sleep apnea 2008   severe OSA-does not use a cpap  . Snoring   . 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 Buddy Duty    Past Surgical History:  Procedure  Laterality Date  . BREAST BIOPSY  2000   s/p  . BREAST BIOPSY Left 01/22/2013   Procedure: RE-EXCISION LEFT BREAST DCIS;  Surgeon: Harl Bowie, MD;  Location: [facility];  Service: General;  Laterality: Left;  . BREAST LUMPECTOMY Left 01/06/2013   Procedure: LUMPECTOMY;  Surgeon: Harl Bowie, MD;  Location: [facility];  Service: General;  Laterality: Left;  . BREAST LUMPECTOMY Left 2017  . BREAST LUMPECTOMY WITH NEEDLE LOCALIZATION AND AXILLARY SENTINEL LYMPH NODE BX Left 09/30/2015   Procedure: Left BREAST LUMPECTOMY WITH NEEDLE LOCALIZATION AND AXILLARY SENTINEL LYMPH NODE BX;  Surgeon: Coralie Keens, MD;  Location: [facility];  Service: General;  Laterality: Left;  . BREAST RECONSTRUCTION Bilateral 10/12/2015   Procedure: BREAST oncoplastic RECONSTRUCTION;  Surgeon: Irene Limbo, MD;  Location: [facility];  Service: Plastics;  Laterality: Bilateral;  . BREAST REDUCTION SURGERY Bilateral 10/12/2015   Procedure: MAMMARY REDUCTION  (BREAST);  Surgeon: Irene Limbo, MD;  Location: [facility];  Service: Plastics;  Laterality: Bilateral;  . DILATION AND CURETTAGE OF UTERUS  2004   s/p  . EXCISION OF BREAST LESION Left 10/12/2015   Procedure: Re-EXCISION OF BREAST;  Surgeon: Coralie Keens, MD;  Location: [facility];  Service: General;  Laterality: Left;  . REDUCTION MAMMAPLASTY Bilateral 09/2015  . THYROIDECTOMY  02/2009  Dr Harlow Asa  . TONSILLECTOMY  1982    Social History   Social History  . Marital status: Single    Spouse name: N/A  . Number of children: N/A  . Years of education: N/A   Occupational History  .  [employer]    Outpatient scheduling in radiology   Social History Main Topics  . Smoking status: Former Smoker    Packs/day: 0.50    Types: Cigarettes    Quit date: 09/29/2007  . Smokeless tobacco: Never Used     Comment: Smoked on and off for 20 years. Quit about 8 years ago (as of 08/2015)  .  Alcohol use 0.0 oz/week     Comment: occasionally  . Drug use: No  . Sexual activity: Yes   Other Topics Concern  . Not on file   Social History Narrative   The patient is divorced, moved to [place] in   2006 from [place].  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    No alcohol.  Tobacco use:  She quit 5 years ago.  She smoked on   and off for approximately 20 years.  No history of recreational drug   Use.Drinks two cups of coffee per work day.     Current Outpatient Prescriptions on File Prior to Visit  Medication Sig Dispense Refill  . aspirin 81 MG chewable tablet Chew 81 mg by mouth daily.    Marland Kitchen levothyroxine (SYNTHROID, LEVOTHROID) 150 MCG tablet TAKE 1 TABLET BY MOUTH DAILY BEFORE BREAKFAST 30 tablet 1  . LYRICA 75 MG capsule TAKE 1 CAPSULE BY MOUTH TWICE A DAY 60 capsule 2  . metFORMIN (GLUCOPHAGE) 1000 MG tablet TAKE 1 TABLET BY MOUTH 2 TIMES DAILY WITH A MEAL 60 tablet 0  . Multiple Vitamin (MULTIVITAMIN WITH MINERALS) TABS tablet Take 1 tablet by mouth daily.    Marland Kitchen olmesartan (BENICAR) 20 MG tablet TAKE 1 TABLET BY MOUTH DAILY 30 tablet 10  . oxyCODONE-acetaminophen (PERCOCET) 7.5-325 MG tablet Take 1 tablet by mouth every 4 (four) hours as needed for severe pain. 30 tablet 0  . pantoprazole (PROTONIX) 40 MG tablet TAKE 1 TABLET BY MOUTH ONCE DAILY 90 tablet 2  . rosuvastatin (CRESTOR) 10 MG tablet TAKE 1 TABLET (10 MG TOTAL) BY MOUTH DAILY. 90 tablet 0  . TRUE METRIX BLOOD GLUCOSE TEST test strip USE AS DIRECTED EACH DAY 100 each 0  . [DISCONTINUED] Calcium Carbonate 1500 MG TABS Take 1,500 mg by mouth daily.       No current facility-administered medications on file prior to visit.     Allergies  Allergen Reactions  . Penicillins Shortness Of Breath and Rash    Has patient had a PCN reaction causing immediate rash, facial/tongue/throat swelling, SOB or lightheadedness with  hypotension: Yes Has patient had a PCN reaction causing severe rash involving mucus membranes or skin necrosis: Yes Has patient had a PCN reaction that required hospitalization No Has patient had a PCN reaction occurring within the last 10 years: Yes If all of the above answers are "NO", then may proceed with Cephalosporin use.   . Clindamycin/Lincomycin Other (See Comments)    Severe stomach cramps  . Ace Inhibitors Cough    REACTION: cough; denies airway involvement     Family History  Problem Relation Age of Onset  . Dementia Father        deceased age 81  secondary to dementia  . Bipolar disorder Father   . Emphysema Father   . Heart disease Father   . COPD Father        smoker  . CAD Mother 31       Died age 63 of CAD  . Heart disease Mother   . Graves' disease Mother   . CAD Maternal Grandfather 60  . Heart disease Maternal Grandfather   . Other Sister 41       hysterectomy for fibroids  . Prostate cancer Brother        low grade; w/ surveillance  . Thyroid nodules Brother 71  . Fibroids Other        niece dx approx 32  . Kidney cancer Cousin        maternal 1st cousin dx 66-47; former smoker  . Lung cancer Other        maternal great aunt (MGM's sister); not a smoker  . Cancer Other        nephew dx neuroblastoma at 22.26 years old  . Colon cancer Neg Hx   . Colon polyps Neg Hx     BP 136/86   Pulse 81   Ht 5\' 4"  (1.626 m)   Wt 246 lb (111.6 kg)   SpO2 98%   BMI 42.23 kg/m    Review of Systems She denies hypoglycemia    Objective:   Physical Exam VITAL SIGNS:  See vs page.  GENERAL: no distress.   Pulses: dorsalis pedis intact bilat.  MSK: no deformity of the feet.   CV: trace bilat leg edema.   Skin:  no ulcer on the feet.  normal color and temp on the feet.  Neuro: sensation is intact to touch on the feet.   A1c=8.1%    Assessment & Plan:  Insulin-requiring type 2 DM: improved control.  Obesity, improved, due to her efforts.  Thyroid  cancer: labs are due.   Patient Instructions  check your blood sugar once a day.  vary the time of day when you check, between before the 3 meals, and at bedtime.  also check if you have symptoms of your blood sugar being too high or too low.  please keep a record of the readings and bring it to your next appointment here.  You can write it on any piece of paper.  please call us sooner if your blood sugar goes below 70, or if you have a lot of readings over 200.   Please take lantus, 40 units each morning, no matter what your blood sugar is.  Whatever you can do to continue your weight loss, that helps  Please come back for a follow-up appointment in 3 months.   Please do your thyroid cancer blood tests when you see [name].

## 2016-06-14 NOTE — Patient Instructions (Addendum)
check your blood sugar once a day.  vary the time of day when you check, between before the 3 meals, and at bedtime.  also check if you have symptoms of your blood sugar being too high or too low.  please keep a record of the readings and bring it to your next appointment here.  You can write it on any piece of paper.  please call us sooner if your blood sugar goes below 70, or if you have a lot of readings over 200.   Please take lantus, 40 units each morning, no matter what your blood sugar is.  Whatever you can do to continue your weight loss, that helps  Please come back for a follow-up appointment in 3 months.   Please do your thyroid cancer blood tests when you see [name].

## 2016-06-15 DIAGNOSIS — D0510 Intraductal carcinoma in situ of unspecified breast: Secondary | ICD-10-CM | POA: Insufficient documentation

## 2016-06-15 DIAGNOSIS — S76311A Strain of muscle, fascia and tendon of the posterior muscle group at thigh level, right thigh, initial encounter: Secondary | ICD-10-CM | POA: Insufficient documentation

## 2016-06-15 NOTE — Assessment & Plan Note (Signed)
Independently reviewed radiographs and no evidence avulsion fracture.  Has some strength still in knee flexion and no evidence large bruising, swelling.  Plan to use ice, aleve with percocet as needed.  Crutches with only touch down weight bearing.  Consider compression shorts.  F/u in 1 week for reevaluation, consider imaging (u/s, MRI) depending on her status.

## 2016-06-15 NOTE — Progress Notes (Signed)
PCP: Jearld Fenton, NP  Subjective:   HPI: Patient is a 51 y.o. female here for right hamstring injury.  Patient reports on 5/19 she was in her garage wearing flip flops when she slipped on wet floor, right leg extended all the way forward. Heard and felt a pop proximally in posterior thigh/hamstring area. Pain level 8/10 and sharp. Some swelling but no bruising. Has been icing, elevating, using TENS unit. Associated tingling down leg into toes. No prior injuries to this leg. No skin changes.  Past Medical History:  Diagnosis Date  . Allergy    seasonal  . Breast cancer ([place]) 2014  . Breast cancer ([place]) 2017  . Bronchitis    hx of  . Diabetes mellitus, type II ([place])   . GERD (gastroesophageal reflux disease)   . Headache   . History of radiation therapy 12/01/15-01/11/16   Left breast DIBH / 50.4 Gy in 28 fractions and Left breast boost / 12 Gy in 6 fractions  . Hyperlipidemia   . Hypertension   . Hypothyroidism   . Obesity   . Pneumonia    as a child  . Sleep apnea 2008   severe OSA-does not use a cpap  . Snoring   . 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 Buddy Duty    Current Outpatient Prescriptions on File Prior to Visit  Medication Sig Dispense Refill  . aspirin 81 MG chewable tablet Chew 81 mg by mouth daily.    Marland Kitchen levothyroxine (SYNTHROID, LEVOTHROID) 150 MCG tablet TAKE 1 TABLET BY MOUTH DAILY BEFORE BREAKFAST 30 tablet 1  . LYRICA 75 MG capsule TAKE 1 CAPSULE BY MOUTH TWICE A DAY 60 capsule 2  . metFORMIN (GLUCOPHAGE) 1000 MG tablet TAKE 1 TABLET BY MOUTH 2 TIMES DAILY WITH A MEAL 60 tablet 0  . Multiple Vitamin (MULTIVITAMIN WITH MINERALS) TABS tablet Take 1 tablet by mouth daily.    Marland Kitchen olmesartan (BENICAR) 20 MG tablet TAKE 1 TABLET BY MOUTH DAILY 30 tablet 10  . pantoprazole (PROTONIX) 40 MG tablet TAKE 1 TABLET BY MOUTH ONCE DAILY 90 tablet 2  .  rosuvastatin (CRESTOR) 10 MG tablet TAKE 1 TABLET (10 MG TOTAL) BY MOUTH DAILY. 90 tablet 0  . TRUE METRIX BLOOD GLUCOSE TEST test strip USE AS DIRECTED EACH DAY 100 each 0  . [DISCONTINUED] Calcium Carbonate 1500 MG TABS Take 1,500 mg by mouth daily.       No current facility-administered medications on file prior to visit.     Past Surgical History:  Procedure Laterality Date  . BREAST BIOPSY  2000   s/p  . BREAST BIOPSY Left 01/22/2013   Procedure: RE-EXCISION LEFT BREAST DCIS;  Surgeon: Harl Bowie, MD;  Location: [place];  Service: General;  Laterality: Left;  . BREAST LUMPECTOMY Left 01/06/2013   Procedure: LUMPECTOMY;  Surgeon: Harl Bowie, MD;  Location: [facility];  Service: General;  Laterality: Left;  . BREAST LUMPECTOMY Left 2017  . BREAST LUMPECTOMY WITH NEEDLE LOCALIZATION AND AXILLARY SENTINEL LYMPH NODE BX Left 09/30/2015   Procedure: Left BREAST LUMPECTOMY WITH NEEDLE LOCALIZATION AND AXILLARY SENTINEL LYMPH NODE BX;  Surgeon: Coralie Keens, MD;  Location: [facility];  Service: General;  Laterality: Left;  . BREAST RECONSTRUCTION Bilateral 10/12/2015   Procedure: BREAST oncoplastic RECONSTRUCTION;  Surgeon: Irene Limbo, MD;  Location: [facility];  Service: Plastics;  Laterality: Bilateral;  .  BREAST REDUCTION SURGERY Bilateral 10/12/2015   Procedure: MAMMARY REDUCTION  (BREAST);  Surgeon: Irene Limbo, MD;  Location: [facility];  Service: Plastics;  Laterality: Bilateral;  . DILATION AND CURETTAGE OF UTERUS  2004   s/p  . EXCISION OF BREAST LESION Left 10/12/2015   Procedure: Re-EXCISION OF BREAST;  Surgeon: Coralie Keens, MD;  Location: [facility];  Service: General;  Laterality: Left;  . REDUCTION MAMMAPLASTY Bilateral 09/2015  . THYROIDECTOMY  02/2009   Dr Harlow Asa  . TONSILLECTOMY  1982    Allergies  Allergen Reactions  . Penicillins Shortness Of Breath and Rash    Has patient had a PCN  reaction causing immediate rash, facial/tongue/throat swelling, SOB or lightheadedness with hypotension: Yes Has patient had a PCN reaction causing severe rash involving mucus membranes or skin necrosis: Yes Has patient had a PCN reaction that required hospitalization No Has patient had a PCN reaction occurring within the last 10 years: Yes If all of the above answers are "NO", then may proceed with Cephalosporin use.   . Clindamycin/Lincomycin Other (See Comments)    Severe stomach cramps  . Ace Inhibitors Cough    REACTION: cough; denies airway involvement     Social History   Social History  . Marital status: Single    Spouse name: N/A  . Number of children: N/A  . Years of education: N/A   Occupational History  .  [employer]    Outpatient scheduling in radiology   Social History Main Topics  . Smoking status: Former Smoker    Packs/day: 0.50    Types: Cigarettes    Quit date: 09/29/2007  . Smokeless tobacco: Never Used     Comment: Smoked on and off for 20 years. Quit about 8 years ago (as of 08/2015)  . Alcohol use 0.0 oz/week     Comment: occasionally  . Drug use: No  . Sexual activity: Yes   Other Topics Concern  . Not on file   Social History Narrative   The patient is divorced, moved to [place] in   2006 from [place].  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    No alcohol.  Tobacco use:  She quit 5 years ago.  She smoked on   and off for approximately 20 years.  No history of recreational drug   Use.Drinks two cups of coffee per work day.     Family History  Problem Relation Age of Onset  . Dementia Father        deceased age 82 secondary to dementia  . Bipolar disorder Father   . Emphysema Father   . Heart disease Father   . COPD Father        smoker  . CAD Mother 15       Died age 40 of CAD  . Heart disease Mother   . Graves' disease Mother   . CAD  Maternal Grandfather 60  . Heart disease Maternal Grandfather   . Other Sister 23       hysterectomy for fibroids  . Prostate cancer Brother        low grade; w/ surveillance  . Thyroid nodules Brother 61  . Fibroids Other        niece dx approx 13  . Kidney cancer Cousin        maternal 1st cousin dx 8-47; former smoker  .  Lung cancer Other        maternal great aunt (MGM's sister); not a smoker  . Cancer Other        nephew dx neuroblastoma at 68.79 years old  . Colon cancer Neg Hx   . Colon polyps Neg Hx     BP 115/79   Pulse 81   Ht 5\' 4"  (1.626 m)   Review of Systems: See HPI above.     Objective:  Physical Exam:  Gen: NAD, comfortable in exam room  Right leg/hip: No gross deformity, swelling, bruising. TTP medial proximal hamstring to mid-hamstring.  No other hip or leg tenderness. FROM hip and knee passively.  Strength 3/5 with knee flexion - able to minimally resist flexion. Strength 5/5 ankle dorsiflexion and plantarflexion. Negative logroll. Negative piriformis stretch. Negative ant/post drawer of knee, valgus/varus stress. NVI distally.   Left leg/hip: FROM with 5/5 strength resisted knee flexion.  Assessment & Plan:  1. Right proximal medial hamstring strain - Independently reviewed radiographs and no evidence avulsion fracture.  Has some strength still in knee flexion and no evidence large bruising, swelling.  Plan to use ice, aleve with percocet as needed.  Crutches with only touch down weight bearing.  Consider compression shorts.  F/u in 1 week for reevaluation, consider imaging (u/s, MRI) depending on her status.

## 2016-06-20 ENCOUNTER — Ambulatory Visit: Payer: 59 | Admitting: Family Medicine

## 2016-06-30 ENCOUNTER — Other Ambulatory Visit: Payer: Self-pay | Admitting: Endocrinology

## 2016-06-30 ENCOUNTER — Encounter: Payer: Self-pay | Admitting: Endocrinology

## 2016-06-30 MED ORDER — METFORMIN HCL 1000 MG PO TABS: ORAL_TABLET | ORAL | 2 refills | Status: DC

## 2016-07-06 ENCOUNTER — Other Ambulatory Visit: Payer: Self-pay | Admitting: Endocrinology

## 2016-07-10 ENCOUNTER — Ambulatory Visit: Payer: 59 | Admitting: Oncology

## 2016-07-10 ENCOUNTER — Other Ambulatory Visit: Payer: 59

## 2016-07-14 ENCOUNTER — Encounter: Payer: 59 | Admitting: Adult Health

## 2016-07-18 ENCOUNTER — Other Ambulatory Visit: Payer: Self-pay | Admitting: Endocrinology

## 2016-07-18 MED FILL — LYRICA 75 MG CAPSULE: 75 | 30 days supply | Qty: 60 | Fill #0

## 2016-07-18 MED FILL — LANTUS SOLOSTAR 100 UNITS/M: 100 | 30 days supply | Qty: 30 | Fill #0 | Status: TO

## 2016-07-18 MED FILL — ROSUVASTATIN CALCIUM 10 MG: 10 | 90 days supply | Qty: 90 | Fill #0

## 2016-07-20 ENCOUNTER — Encounter: Payer: 59 | Admitting: Internal Medicine

## 2016-08-01 ENCOUNTER — Other Ambulatory Visit: Payer: Self-pay | Admitting: Internal Medicine

## 2016-08-01 MED FILL — OLMESARTAN MEDOXOMIL 20 MG: 20 | 30 days supply | Qty: 30 | Fill #0

## 2016-08-01 MED FILL — metFORMIN HCL 1000 MG TABS: 1000 | 30 days supply | Qty: 60 | Fill #0

## 2016-08-07 ENCOUNTER — Other Ambulatory Visit: Payer: Self-pay | Admitting: Internal Medicine

## 2016-08-07 MED FILL — LEVOTHYROXINE 150 MCG TAB: 150 | 30 days supply | Qty: 30 | Fill #0

## 2016-08-07 MED FILL — PANTOPRAZOLE SOD DR 40 MG T: 40 | 90 days supply | Qty: 90 | Fill #0

## 2016-08-14 DIAGNOSIS — E119 Type 2 diabetes mellitus without complications: Secondary | ICD-10-CM | POA: Diagnosis not present

## 2016-08-14 DIAGNOSIS — H524 Presbyopia: Secondary | ICD-10-CM | POA: Diagnosis not present

## 2016-08-14 DIAGNOSIS — D3131 Benign neoplasm of right choroid: Secondary | ICD-10-CM | POA: Diagnosis not present

## 2016-08-28 MED FILL — metFORMIN HCL 1000 MG TABS: 1000 | 30 days supply | Qty: 60 | Fill #1

## 2016-09-04 ENCOUNTER — Other Ambulatory Visit: Payer: Self-pay | Admitting: Internal Medicine

## 2016-09-04 MED FILL — TAMOXIFEN 20 MG TABLET: 20 | 90 days supply | Qty: 90 | Fill #0

## 2016-09-05 MED FILL — OLMESARTAN MEDOXOMIL 20 MG: 20 | 30 days supply | Qty: 30 | Fill #0 | Status: TO

## 2016-09-07 ENCOUNTER — Other Ambulatory Visit (HOSPITAL_COMMUNITY)
Admission: RE | Admit: 2016-09-07 | Discharge: 2016-09-07 | Disposition: A | Discharge status: Home / Self Care | Payer: 59 | Source: Ambulatory Visit | Attending: Obstetrics and Gynecology | Admitting: Obstetrics and Gynecology

## 2016-09-07 ENCOUNTER — Other Ambulatory Visit: Payer: Self-pay | Admitting: Endocrinology

## 2016-09-07 ENCOUNTER — Other Ambulatory Visit: Payer: Self-pay | Admitting: Obstetrics and Gynecology

## 2016-09-07 DIAGNOSIS — Z124 Encounter for screening for malignant neoplasm of cervix: Secondary | ICD-10-CM | POA: Insufficient documentation

## 2016-09-07 DIAGNOSIS — Z01411 Encounter for gynecological examination (general) (routine) with abnormal findings: Secondary | ICD-10-CM | POA: Diagnosis not present

## 2016-09-07 MED FILL — LEVOTHYROXINE 150 MCG TAB: 150 | 30 days supply | Qty: 30 | Fill #0

## 2016-09-11 LAB — CYTOLOGY - PAP
Diagnosis: NEGATIVE
HPV (WINDOPATH): NOT DETECTED

## 2016-09-18 ENCOUNTER — Other Ambulatory Visit: Payer: Self-pay | Admitting: Podiatry

## 2016-09-21 DIAGNOSIS — Z86000 Personal history of in-situ neoplasm of breast: Secondary | ICD-10-CM | POA: Diagnosis not present

## 2016-09-21 DIAGNOSIS — E669 Obesity, unspecified: Secondary | ICD-10-CM | POA: Diagnosis not present

## 2016-09-21 DIAGNOSIS — E1169 Type 2 diabetes mellitus with other specified complication: Secondary | ICD-10-CM | POA: Diagnosis not present

## 2016-09-21 DIAGNOSIS — I10 Essential (primary) hypertension: Secondary | ICD-10-CM | POA: Diagnosis not present

## 2016-09-22 ENCOUNTER — Other Ambulatory Visit (HOSPITAL_COMMUNITY): Payer: Self-pay | Admitting: General Surgery

## 2016-09-28 ENCOUNTER — Ambulatory Visit (INDEPENDENT_AMBULATORY_CARE_PROVIDER_SITE_OTHER): Payer: 59 | Admitting: Internal Medicine

## 2016-09-28 ENCOUNTER — Encounter: Payer: Self-pay | Admitting: Internal Medicine

## 2016-09-28 VITALS — BP 126/78 | HR 96 | Temp 97.9°F | Ht 64.0 in | Wt 240.5 lb

## 2016-09-28 DIAGNOSIS — C50112 Malignant neoplasm of central portion of left female breast: Secondary | ICD-10-CM | POA: Diagnosis not present

## 2016-09-28 DIAGNOSIS — F419 Anxiety disorder, unspecified: Secondary | ICD-10-CM

## 2016-09-28 DIAGNOSIS — Z0000 Encounter for general adult medical examination without abnormal findings: Secondary | ICD-10-CM

## 2016-09-28 DIAGNOSIS — F32A Depression, unspecified: Secondary | ICD-10-CM

## 2016-09-28 DIAGNOSIS — F329 Major depressive disorder, single episode, unspecified: Secondary | ICD-10-CM | POA: Diagnosis not present

## 2016-09-28 DIAGNOSIS — K219 Gastro-esophageal reflux disease without esophagitis: Secondary | ICD-10-CM | POA: Diagnosis not present

## 2016-09-28 DIAGNOSIS — Z170 Estrogen receptor positive status [ER+]: Secondary | ICD-10-CM | POA: Diagnosis not present

## 2016-09-28 DIAGNOSIS — E890 Postprocedural hypothyroidism: Secondary | ICD-10-CM | POA: Diagnosis not present

## 2016-09-28 DIAGNOSIS — E785 Hyperlipidemia, unspecified: Secondary | ICD-10-CM | POA: Diagnosis not present

## 2016-09-28 DIAGNOSIS — I10 Essential (primary) hypertension: Secondary | ICD-10-CM | POA: Diagnosis not present

## 2016-09-28 LAB — CBC WITH DIFFERENTIAL/PLATELET
BASOS ABS: 0 10*3/uL (ref 0.0–0.1)
Basophils Relative: 0.8 % (ref 0.0–3.0)
EOS ABS: 0.1 10*3/uL (ref 0.0–0.7)
Eosinophils Relative: 1.7 % (ref 0.0–5.0)
HEMATOCRIT: 42.5 % (ref 36.0–46.0)
Hemoglobin: 13.7 g/dL (ref 12.0–15.0)
LYMPHS ABS: 1.5 10*3/uL (ref 0.7–4.0)
LYMPHS PCT: 23.8 % (ref 12.0–46.0)
MCHC: 32.2 g/dL (ref 30.0–36.0)
MCV: 85.1 fl (ref 78.0–100.0)
Monocytes Absolute: 0.4 10*3/uL (ref 0.1–1.0)
Monocytes Relative: 6.8 % (ref 3.0–12.0)
NEUTROS ABS: 4.1 10*3/uL (ref 1.4–7.7)
NEUTROS PCT: 66.9 % (ref 43.0–77.0)
PLATELETS: 267 10*3/uL (ref 150.0–400.0)
RBC: 4.99 Mil/uL (ref 3.87–5.11)
RDW: 14.1 % (ref 11.5–15.5)
WBC: 6.2 10*3/uL (ref 4.0–10.5)

## 2016-09-28 LAB — COMPREHENSIVE METABOLIC PANEL
ALBUMIN: 3.9 g/dL (ref 3.5–5.2)
ALT: 15 U/L (ref 0–35)
AST: 12 U/L (ref 0–37)
Alkaline Phosphatase: 69 U/L (ref 39–117)
BILIRUBIN TOTAL: 0.3 mg/dL (ref 0.2–1.2)
BUN: 13 mg/dL (ref 6–23)
CALCIUM: 9.4 mg/dL (ref 8.4–10.5)
CO2: 30 mEq/L (ref 19–32)
Chloride: 101 mEq/L (ref 96–112)
Creatinine, Ser: 0.65 mg/dL (ref 0.40–1.20)
GFR: 101.87 mL/min (ref >60.00)
Glucose, Bld: 252 mg/dL — ABNORMAL HIGH (ref 70–99)
Potassium: 4.3 mEq/L (ref 3.5–5.1)
SODIUM: 139 meq/L (ref 135–145)
TOTAL PROTEIN: 7 g/dL (ref 6.0–8.3)

## 2016-09-28 LAB — LIPID PANEL
CHOLESTEROL: 132 mg/dL (ref 0–200)
HDL: 37.4 mg/dL — ABNORMAL LOW (ref >39.00)
LDL Cholesterol: 62 mg/dL (ref 0–99)
NONHDL: 94.17
Total CHOL/HDL Ratio: 4
Triglycerides: 161 mg/dL — ABNORMAL HIGH (ref 0.0–149.0)
VLDL: 32.2 mg/dL (ref 0.0–40.0)

## 2016-09-28 LAB — TSH: TSH: 0.42 u[IU]/mL (ref 0.35–4.50)

## 2016-09-28 LAB — HEMOGLOBIN A1C: Hgb A1c MFr Bld: 9.2 % — ABNORMAL HIGH (ref 4.6–6.5)

## 2016-09-28 LAB — IBC PANEL
IRON: 43 ug/dL (ref 42–145)
SATURATION RATIOS: 12.4 % — AB (ref 20.0–50.0)
Transferrin: 247 mg/dL (ref 212.0–360.0)

## 2016-09-28 LAB — VITAMIN D 25 HYDROXY (VIT D DEFICIENCY, FRACTURES): VITD: 23.27 ng/mL — AB (ref 30.00–100.00)

## 2016-09-28 LAB — VITAMIN B12: VITAMIN B 12: 335 pg/mL (ref 211–911)

## 2016-09-28 NOTE — Patient Instructions (Signed)
Health Maintenance for Postmenopausal Women Menopause is a normal process in which your reproductive ability comes to an end. This process happens gradually over a span of months to years, usually between the ages of 22 and 9. Menopause is complete when you have missed 12 consecutive menstrual periods. It is important to talk with your health care provider about some of the most common conditions that affect postmenopausal women, such as heart disease, cancer, and bone loss (osteoporosis). Adopting a healthy lifestyle and getting preventive care can help to promote your health and wellness. Those actions can also lower your chances of developing some of these common conditions. What should I know about menopause? During menopause, you may experience a number of symptoms, such as:  Moderate-to-severe hot flashes.  Night sweats.  Decrease in sex drive.  Mood swings.  Headaches.  Tiredness.  Irritability.  Memory problems.  Insomnia.  Choosing to treat or not to treat menopausal changes is an individual decision that you make with your health care provider. What should I know about hormone replacement therapy and supplements? Hormone therapy products are effective for treating symptoms that are associated with menopause, such as hot flashes and night sweats. Hormone replacement carries certain risks, especially as you become older. If you are thinking about using estrogen or estrogen with progestin treatments, discuss the benefits and risks with your health care provider. What should I know about heart disease and stroke? Heart disease, heart attack, and stroke become more likely as you age. This may be due, in part, to the hormonal changes that your body experiences during menopause. These can affect how your body processes dietary fats, triglycerides, and cholesterol. Heart attack and stroke are both medical emergencies. There are many things that you can do to help prevent heart disease  and stroke:  Have your blood pressure checked at least every 1-2 years. High blood pressure causes heart disease and increases the risk of stroke.  If you are 53-22 years old, ask your health care provider if you should take aspirin to prevent a heart attack or a stroke.  Do not use any tobacco products, including cigarettes, chewing tobacco, or electronic cigarettes. If you need help quitting, ask your health care provider.  It is important to eat a healthy diet and maintain a healthy weight. ? Be sure to include plenty of vegetables, fruits, low-fat dairy products, and lean protein. ? Avoid eating foods that are high in solid fats, added sugars, or salt (sodium).  Get regular exercise. This is one of the most important things that you can do for your health. ? Try to exercise for at least 150 minutes each week. The type of exercise that you do should increase your heart rate and make you sweat. This is known as moderate-intensity exercise. ? Try to do strengthening exercises at least twice each week. Do these in addition to the moderate-intensity exercise.  Know your numbers.Ask your health care provider to check your cholesterol and your blood glucose. Continue to have your blood tested as directed by your health care provider.  What should I know about cancer screening? There are several types of cancer. Take the following steps to reduce your risk and to catch any cancer development as early as possible. Breast Cancer  Practice breast self-awareness. ? This means understanding how your breasts normally appear and feel. ? It also means doing regular breast self-exams. Let your health care provider know about any changes, no matter how small.  If you are 40  or older, have a clinician do a breast exam (clinical breast exam or CBE) every year. Depending on your age, family history, and medical history, it may be recommended that you also have a yearly breast X-ray (mammogram).  If you  have a family history of breast cancer, talk with your health care provider about genetic screening.  If you are at high risk for breast cancer, talk with your health care provider about having an MRI and a mammogram every year.  Breast cancer (BRCA) gene test is recommended for women who have family members with BRCA-related cancers. Results of the assessment will determine the need for genetic counseling and BRCA1 and for BRCA2 testing. BRCA-related cancers include these types: ? Breast. This occurs in males or females. ? Ovarian. ? Tubal. This may also be called fallopian tube cancer. ? Cancer of the abdominal or pelvic lining (peritoneal cancer). ? Prostate. ? Pancreatic.  Cervical, Uterine, and Ovarian Cancer Your health care provider may recommend that you be screened regularly for cancer of the pelvic organs. These include your ovaries, uterus, and vagina. This screening involves a pelvic exam, which includes checking for microscopic changes to the surface of your cervix (Pap test).  For women ages 21-65, health care providers may recommend a pelvic exam and a Pap test every three years. For women ages 79-65, they may recommend the Pap test and pelvic exam, combined with testing for human papilloma virus (HPV), every five years. Some types of HPV increase your risk of cervical cancer. Testing for HPV may also be done on women of any age who have unclear Pap test results.  Other health care providers may not recommend any screening for nonpregnant women who are considered low risk for pelvic cancer and have no symptoms. Ask your health care provider if a screening pelvic exam is right for you.  If you have had past treatment for cervical cancer or a condition that could lead to cancer, you need Pap tests and screening for cancer for at least 20 years after your treatment. If Pap tests have been discontinued for you, your risk factors (such as having a new sexual partner) need to be  reassessed to determine if you should start having screenings again. Some women have medical problems that increase the chance of getting cervical cancer. In these cases, your health care provider may recommend that you have screening and Pap tests more often.  If you have a family history of uterine cancer or ovarian cancer, talk with your health care provider about genetic screening.  If you have vaginal bleeding after reaching menopause, tell your health care provider.  There are currently no reliable tests available to screen for ovarian cancer.  Lung Cancer Lung cancer screening is recommended for adults 69-62 years old who are at high risk for lung cancer because of a history of smoking. A yearly low-dose CT scan of the lungs is recommended if you:  Currently smoke.  Have a history of at least 30 pack-years of smoking and you currently smoke or have quit within the past 15 years. A pack-year is smoking an average of one pack of cigarettes per day for one year.  Yearly screening should:  Continue until it has been 15 years since you quit.  Stop if you develop a health problem that would prevent you from having lung cancer treatment.  Colorectal Cancer  This type of cancer can be detected and can often be prevented.  Routine colorectal cancer screening usually begins at  age 42 and continues through age 45.  If you have risk factors for colon cancer, your health care provider may recommend that you be screened at an earlier age.  If you have a family history of colorectal cancer, talk with your health care provider about genetic screening.  Your health care provider may also recommend using home test kits to check for hidden blood in your stool.  A small camera at the end of a tube can be used to examine your colon directly (sigmoidoscopy or colonoscopy). This is done to check for the earliest forms of colorectal cancer.  Direct examination of the colon should be repeated every  5-10 years until age 71. However, if early forms of precancerous polyps or small growths are found or if you have a family history or genetic risk for colorectal cancer, you may need to be screened more often.  Skin Cancer  Check your skin from head to toe regularly.  Monitor any moles. Be sure to tell your health care provider: ? About any new moles or changes in moles, especially if there is a change in a mole's shape or color. ? If you have a mole that is larger than the size of a pencil eraser.  If any of your family members has a history of skin cancer, especially at a young age, talk with your health care provider about genetic screening.  Always use sunscreen. Apply sunscreen liberally and repeatedly throughout the day.  Whenever you are outside, protect yourself by wearing long sleeves, pants, a wide-brimmed hat, and sunglasses.  What should I know about osteoporosis? Osteoporosis is a condition in which bone destruction happens more quickly than new bone creation. After menopause, you may be at an increased risk for osteoporosis. To help prevent osteoporosis or the bone fractures that can happen because of osteoporosis, the following is recommended:  If you are 46-71 years old, get at least 1,000 mg of calcium and at least 600 mg of vitamin D per day.  If you are older than age 55 but younger than age 65, get at least 1,200 mg of calcium and at least 600 mg of vitamin D per day.  If you are older than age 54, get at least 1,200 mg of calcium and at least 800 mg of vitamin D per day.  Smoking and excessive alcohol intake increase the risk of osteoporosis. Eat foods that are rich in calcium and vitamin D, and do weight-bearing exercises several times each week as directed by your health care provider. What should I know about how menopause affects my mental health? Depression may occur at any age, but it is more common as you become older. Common symptoms of depression  include:  Low or sad mood.  Changes in sleep patterns.  Changes in appetite or eating patterns.  Feeling an overall lack of motivation or enjoyment of activities that you previously enjoyed.  Frequent crying spells.  Talk with your health care provider if you think that you are experiencing depression. What should I know about immunizations? It is important that you get and maintain your immunizations. These include:  Tetanus, diphtheria, and pertussis (Tdap) booster vaccine.  Influenza every year before the flu season begins.  Pneumonia vaccine.  Shingles vaccine.  Your health care provider may also recommend other immunizations. This information is not intended to replace advice given to you by your health care provider. Make sure you discuss any questions you have with your health care provider. Document Released: 03/03/2005  Document Revised: 07/30/2015 Document Reviewed: 10/13/2014 Elsevier Interactive Patient Education  2018 Elsevier Inc.  

## 2016-09-28 NOTE — Assessment & Plan Note (Signed)
A1C today No microalbumin secondary to ARB therapy Encouraged her to consume a low carb diet and exercise for weight loss Foot exams done by endo Encouraged yearly eye exam Continue Metformin and Lantus for now

## 2016-09-28 NOTE — Assessment & Plan Note (Signed)
Encouraged weight loss Continue Protonix for now

## 2016-09-28 NOTE — Assessment & Plan Note (Signed)
CMET and lipid profile today Encouraged her to consume a low fat diet Continue Crestor for now

## 2016-09-28 NOTE — Assessment & Plan Note (Signed)
TSH today Continue current dose of Synthroid, will adjust dose if needed based on labs

## 2016-09-28 NOTE — Assessment & Plan Note (Signed)
Currently not an issue Will monitor 

## 2016-09-28 NOTE — Assessment & Plan Note (Signed)
Controlled on Benicar Will monitor

## 2016-09-28 NOTE — Progress Notes (Signed)
Subjective:    Patient ID: Stacey Roberts, female    DOB: 02/23/1965, 51 y.o.   MRN: [id number]  HPI  Pt presents to the clinic today for her annual exam. She is also due to follow up chronic conditions.  DM 2: Her [name] was 8.1%. She is taking Metformin and Lantus as prescribed. She reports her sugars are running in the high 200's. She does not check her feet. Her last eye exam was 07/2016. She follows with Dr. Loanne Drilling.  History of breast cancer: Reoccurred in the left breast 09/2015. S/p lumpectomy, reduction and reconstruction. She also had radiation. She follows with Dr. Griffith Citron.  Anxiety: Stable off medications at this time.  Hypothyroidism: She denies any issues on her current dose of Synthroid. She follows with Dr. Loanne Drilling. Her last TSH was checked 03/2015.  HLD: Her last LDL was 60.06/2015. She is taking Crestor as prescribed. She denies myalgisas. She does not consume a low fat diet.  HTN: Her BP today is 126/78. She is taking Benicar daily as prescribed. ECG from 09/2015 reviewed.  GERD: She is not sure what triggers this. She is taking Protonix daily as prescribed and denies breakthrough symptoms.   Flu: 10/2015 Tetanus: 2007 Pneumovax: 2007 Pap Smear: 08/2016 Mammogram: 05/2016 Colon Screening: 03/2015 Vision Screening: annually Dentist: as needed  Diet: She does eat meat. She consumes fruits and veggies daily. She does eat fried foods. She drinks mostly water, some Dt. Iced Tea. Exercise: None  Review of Systems  Past Medical History:  Diagnosis Date  . Allergy    seasonal  . Breast cancer ([place]) 2014  . Breast cancer ([place]) 2017  . Bronchitis    hx of  . Diabetes mellitus, type II ([place])   . GERD (gastroesophageal reflux disease)   . Headache   . History of radiation therapy 12/01/15-01/11/16   Left breast DIBH / 50.4 Gy in 28 fractions and Left breast boost / 12 Gy in 6 fractions  . Hyperlipidemia   . Hypertension   . Hypothyroidism   . Obesity   .  Pneumonia    as a child  . Sleep apnea 2008   severe OSA-does not use a cpap  . Snoring   . 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 Buddy Duty    Current Outpatient Prescriptions  Medication Sig Dispense Refill  . aspirin 81 MG chewable tablet Chew 81 mg by mouth daily.    . Insulin Glargine (LANTUS SOLOSTAR) 100 UNIT/ML Solostar Pen Inject 40 Units into the skin every morning. And pen needles 1/day 15 pen 11  . levothyroxine (SYNTHROID, LEVOTHROID) 150 MCG tablet TAKE 1 TABLET BY MOUTH DAILY BEFORE BREAKFAST 30 tablet 0  . LYRICA 75 MG capsule TAKE 1 CAPSULE BY MOUTH TWICE A DAY 60 capsule 2  . metFORMIN (GLUCOPHAGE) 1000 MG tablet Take one tablet by mouth two times daily with meals. 60 tablet 2  . olmesartan (BENICAR) 20 MG tablet Take 1 tablet (20 mg total) by mouth daily. 30 tablet 0  . pantoprazole (PROTONIX) 40 MG tablet Take 1 tablet (40 mg total) by mouth daily. 90 tablet 0  . rosuvastatin (CRESTOR) 10 MG tablet TAKE 1 TABLET (10 MG TOTAL) BY MOUTH DAILY. 90 tablet 0  . TRUE METRIX BLOOD GLUCOSE TEST test strip USE AS DIRECTED EACH DAY 100 each 0   No current facility-administered medications for this visit.  Allergies  Allergen Reactions  . Penicillins Shortness Of Breath and Rash    Has patient had a PCN reaction causing immediate rash, facial/tongue/throat swelling, SOB or lightheadedness with hypotension: Yes Has patient had a PCN reaction causing severe rash involving mucus membranes or skin necrosis: Yes Has patient had a PCN reaction that required hospitalization No Has patient had a PCN reaction occurring within the last 10 years: Yes If all of the above answers are "NO", then may proceed with Cephalosporin use.   . Clindamycin/Lincomycin Other (See Comments)    Severe stomach cramps  . Ace Inhibitors Cough    REACTION: cough; denies airway involvement      Family History  Problem Relation Age of Onset  . Dementia Father        deceased age 97 secondary to dementia  . Bipolar disorder Father   . Emphysema Father   . Heart disease Father   . COPD Father        smoker  . CAD Mother 47       Died age 71 of CAD  . Heart disease Mother   . Graves' disease Mother   . CAD Maternal Grandfather 60  . Heart disease Maternal Grandfather   . Other Sister 49       hysterectomy for fibroids  . Prostate cancer Brother        low grade; w/ surveillance  . Thyroid nodules Brother 57  . Fibroids Other        niece dx approx 11  . Kidney cancer Cousin        maternal 1st cousin dx 30-47; former smoker  . Lung cancer Other        maternal great aunt (MGM's sister); not a smoker  . Cancer Other        nephew dx neuroblastoma at 53.74 years old  . Colon cancer Neg Hx   . Colon polyps Neg Hx     Social History   Social History  . Marital status: Single    Spouse name: N/A  . Number of children: N/A  . Years of education: N/A   Occupational History  .  [employer]    Outpatient scheduling in radiology   Social History Main Topics  . Smoking status: Former Smoker    Packs/day: 0.50    Types: Cigarettes    Quit date: 09/29/2007  . Smokeless tobacco: Never Used     Comment: Smoked on and off for 20 years. Quit about 8 years ago (as of 08/2015)  . Alcohol use 0.0 oz/week     Comment: occasionally  . Drug use: No  . Sexual activity: Yes   Other Topics Concern  . Not on file   Social History Narrative   The patient is divorced, moved to [place] in   2006 from [place].  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    No alcohol.  Tobacco use:  She quit 5 years ago.  She smoked on   and off for approximately 20 years.  No history of recreational drug   Use.Drinks two cups of coffee per work day.      Constitutional: Denies fever, malaise,  fatigue, headache or abrupt weight changes.  HEENT: Denies eye pain, eye redness, ear pain, ringing in the ears, wax buildup, runny nose, nasal congestion, bloody nose, or sore throat. Respiratory: Denies difficulty breathing, shortness  of breath, cough or sputum production.   Cardiovascular: Denies chest pain, chest tightness, palpitations or swelling in the hands or feet.  Gastrointestinal: Denies abdominal pain, bloating, constipation, diarrhea or blood in the stool.  GU: Denies urgency, frequency, pain with urination, burning sensation, blood in urine, odor or discharge. Musculoskeletal: Denies decrease in range of motion, difficulty with gait, muscle pain or joint pain and swelling.  Skin: Denies redness, rashes, lesions or ulcercations.  Neurological: Denies dizziness, difficulty with memory, difficulty with speech or problems with balance and coordination.  Psych: Denies anxiety, depression, SI/HI.  No other specific complaints in a complete review of systems (except as listed in HPI above).     Objective:   Physical Exam   BP 126/78   Pulse 96   Temp 97.9 F (36.6 C) (Oral)   Ht 5\' 4"  (1.626 m)   Wt 240 lb 8 oz (109.1 kg)   SpO2 98%   BMI 41.28 kg/m  Wt Readings from Last 3 Encounters:  09/28/16 240 lb 8 oz (109.1 kg)  06/14/16 246 lb (111.6 kg)  05/29/16 241 lb (109.3 kg)    General: Appears her stated age, obese in NAD. Skin: Warm, dry and intact. No ulcerations noted. HEENT: Head: normal shape and size; Eyes: sclera white, no icterus, conjunctiva pink, PERRLA and EOMs intact; Ears: Tm's gray and intact, normal light reflex; Throat/Mouth: Teeth present, mucosa pink and moist, no exudate, lesions or ulcerations noted.  Neck:  Neck supple, trachea midline. No masses, lumps or thyromegaly present.  Cardiovascular: Normal rate and rhythm. S1,S2 noted.  Faint murmur noted. No JVD or BLE edema. No carotid bruits noted. Pulmonary/Chest: Normal effort and positive vesicular  breath sounds. No respiratory distress. No wheezes, rales or ronchi noted.  Abdomen: Soft and nontender. Normal bowel sounds. No distention or masses noted. Liver, spleen and kidneys non palpable. Musculoskeletal: Strength 5/5 BUE/BLE. No difficulty with gait.  Neurological: Alert and oriented. Cranial nerves II-XII grossly intact. Coordination normal.  Psychiatric: Mood and affect normal. Behavior is normal. Judgment and thought content normal.    BMET    Component Value Date/Time   NA 138 09/30/2015 0942   NA 137 02/18/2013 1554   K 4.2 09/30/2015 0942   K 4.4 02/18/2013 1554   CL 105 09/30/2015 0942   CO2 24 09/30/2015 0942   CO2 25 02/18/2013 1554   GLUCOSE 190 (H) 09/30/2015 0942   GLUCOSE 300 (H) 02/18/2013 1554   BUN 11 09/30/2015 0942   BUN 14.2 02/18/2013 1554   CREATININE 0.48 09/30/2015 0942   CREATININE 0.8 02/18/2013 1554   CALCIUM 9.1 09/30/2015 0942   CALCIUM 9.2 02/18/2013 1554   GFRNONAA >60 09/30/2015 0942   GFRAA >60 09/30/2015 0942    Lipid Panel     Component Value Date/Time   CHOL 139 07/20/2015 0858   TRIG 156.0 (H) 07/20/2015 0858   HDL 48.60 07/20/2015 0858   CHOLHDL 3 07/20/2015 0858   VLDL 31.2 07/20/2015 0858   LDLCALC 60 07/20/2015 0858    CBC    Component Value Date/Time   WBC 9.4 09/30/2015 0942   RBC 5.05 09/30/2015 0942   HGB 13.8 09/30/2015 0942   HGB 12.5 02/18/2013 1554   HCT 42.9 09/30/2015 0942   HCT 39.6 02/18/2013 1554   PLT 288 09/30/2015 0942   PLT 306 02/18/2013 1554   MCV 85.0 09/30/2015 0942   MCV 82.6 02/18/2013 1554   MCH 27.3 09/30/2015 0942   MCHC 32.2 09/30/2015 0942  RDW 14.6 09/30/2015 0942   RDW 16.4 (H) 02/18/2013 1554   LYMPHSABS 2.0 02/18/2013 1554   MONOABS 0.6 02/18/2013 1554   EOSABS 0.2 02/18/2013 1554   BASOSABS 0.0 02/18/2013 1554    Hgb A1C Lab Results  Component Value Date   HGBA1C 8.1 06/14/2016           Assessment & Plan:   Preventative Health Maintenance:  Encouraged her  to get a flu shot in the fall She declines tetanus and pneumovax today Pap smear, mammogram and colon screening UTD Encouraged her to consume a balanced diet and exercise regimen Advised her to see an eye doctor and dentist annually Labs ordered today  RTC in 1 year, sooner if needed [name], NP

## 2016-09-28 NOTE — Assessment & Plan Note (Signed)
Currently in remission She will continue to follow with Dr. Griffith Citron

## 2016-10-02 ENCOUNTER — Other Ambulatory Visit: Payer: Self-pay

## 2016-10-02 ENCOUNTER — Encounter: Payer: Self-pay | Admitting: Registered"

## 2016-10-02 ENCOUNTER — Encounter: Payer: Self-pay | Admitting: Endocrinology

## 2016-10-02 ENCOUNTER — Encounter: Payer: 59 | Attending: General Surgery | Admitting: Registered"

## 2016-10-02 DIAGNOSIS — Z713 Dietary counseling and surveillance: Secondary | ICD-10-CM | POA: Diagnosis not present

## 2016-10-02 DIAGNOSIS — Z6841 Body Mass Index (BMI) 40.0 and over, adult: Secondary | ICD-10-CM | POA: Insufficient documentation

## 2016-10-02 DIAGNOSIS — E119 Type 2 diabetes mellitus without complications: Secondary | ICD-10-CM

## 2016-10-02 MED ORDER — METFORMIN HCL 1000 MG PO TABS: ORAL_TABLET | ORAL | 5 refills | Status: DC

## 2016-10-02 MED FILL — LANTUS SOLOSTAR 100 UNITS/M: 100 | 30 days supply | Qty: 30 | Fill #0

## 2016-10-02 MED FILL — metFORMIN HCL 1000 MG TABS: 1000 | 30 days supply | Qty: 60 | Fill #0 | Status: TO

## 2016-10-02 NOTE — Progress Notes (Signed)
Pre-Op Assessment Visit:  Pre-Operative Sleeve Gastrectomy Surgery  Medical Nutrition Therapy:  Appt start time: 3:45  End time:  4:55  Patient was seen on 10/02/2016 for Pre-Operative Nutrition Assessment. Assessment and letter of approval faxed to [facility] Bariatric Surgery Program coordinator on 10/02/2016.   Pt expectation of surgery: reduce A1c, reduce diabetes medication, and get rid of diabetes  Pt expectation of Dietitian: knowledge of what to expect post-surgery, what to eat/not to eat post-op  Start weight at [facility]: 241.5 BMI: 41.13   Pt states she recently lost 15 lbs with weight watchers. Pt states she has unhealthy relationship with food. Pt states she was diagnosed with diabetes 10 yrs ago. Pt states she does not check BS as often because she forgets; checks BS 1x/day: FBS (215-260). Pt states her [name] was last week 9.2. Pt states in 2008-10 she lost 85 lbs due to being scared of diabetes. Pt states she  diagnosed with thyroid cancer, had thyroidectomy and gained 50 lbs.   Per insurance, pt needs 0 SWL visits with [place].    24 hr Dietary Recall: First Meal: egg, canadian bacon, low-fat waffle Snack: strawberries or peach Second Meal: deli [place], low-carb wrap, chips Snack: candy bar Third Meal: fast food-chicken nuggets, fries Snack: weight watchers ice cream bars or chips or popcorn Beverages: water, diet peach tea, coffee  Encouraged to engage in 150 minutes of moderate physical activity including cardiovascular and weight baring weekly  Handouts given during visit include:  . Pre-Op Goals . Bariatric Surgery Protein Shakes . Vitamin and Mineral Recommendations  During the appointment today the following Pre-Op Goals were reviewed with the patient: . Maintain or lose weight as instructed by your surgeon . Make healthy food choices . Begin to limit portion sizes . Limited concentrated sugars and fried foods . Keep fat/sugar in the single digits  per serving on          food labels . Practice CHEWING your food  (aim for 30 chews per bite or until applesauce consistency) . Practice not drinking 15 minutes before, during, and 30 minutes after each meal/snack . Avoid all carbonated beverages  . Avoid/limit caffeinated beverages  . Avoid all sugar-sweetened beverages . Consume 3 meals per day; eat every 3-5 hours . Make a list of non-food related activities . Aim for 64-100 ounces of FLUID daily  . Aim for at least 60-80 grams of PROTEIN daily . Look for a liquid protein source that contain ?15 g protein and ?5 g carbohydrate  (ex: shakes, drinks, shots) . Physical activity is an important part of a healthy lifestyle so keep it moving! - Aim to check blood sugar daily 3-4 times/day: fasting and 2 hours after meals. Track numbers.  - Meal prep for dinner during the week. Cook Sun evening for meals on Mon and Tues. Crock pot meal on Wed for Wed and Thurs. Treat yourself to eating out on Friday; choose healthy options.  -Follow diet recommendations listed below   Energy and Macronutrient Recomendations: Calories: 1600 Carbohydrate: 180 Protein: 120 Fat: 44  Demonstrated degree of understanding via:  Teach Back   Teaching Method Utilized:  Visual Auditory Hands on  Barriers to learning/adherence to lifestyle change: contemplative stage of change  Patient to call the [facility] to enroll in Pre-Op and Post-Op Nutrition Education when surgery date is scheduled.

## 2016-10-02 NOTE — Patient Instructions (Signed)
-   Aim to check blood sugar daily 3-4 times/day: fasting and 2 hours after meals. Track numbers.   - Meal prep for dinner during the week. Cook Sun evening for meals on Mon and Tues. Crock pot meal on Wed for Wed and Thurs. Treat yourself to eating out on Friday; choose healthy options.

## 2016-10-03 LAB — VITAMIN B1: Vitamin B1 (Thiamine): 8 nmol/L (ref 8–30)

## 2016-10-04 ENCOUNTER — Other Ambulatory Visit: Payer: Self-pay

## 2016-10-04 ENCOUNTER — Ambulatory Visit (HOSPITAL_COMMUNITY)
Admission: RE | Admit: 2016-10-04 | Discharge: 2016-10-04 | Disposition: A | Discharge status: Home / Self Care | Payer: 59 | Source: Ambulatory Visit | Attending: General Surgery | Admitting: General Surgery

## 2016-10-04 DIAGNOSIS — K449 Diaphragmatic hernia without obstruction or gangrene: Secondary | ICD-10-CM | POA: Diagnosis not present

## 2016-10-04 DIAGNOSIS — Z01818 Encounter for other preprocedural examination: Secondary | ICD-10-CM | POA: Diagnosis not present

## 2016-10-04 DIAGNOSIS — Z9884 Bariatric surgery status: Secondary | ICD-10-CM | POA: Diagnosis not present

## 2016-10-09 ENCOUNTER — Other Ambulatory Visit: Payer: Self-pay | Admitting: Endocrinology

## 2016-10-09 ENCOUNTER — Other Ambulatory Visit: Payer: Self-pay | Admitting: Internal Medicine

## 2016-10-09 ENCOUNTER — Encounter: Payer: 59 | Admitting: Skilled Nursing Facility1

## 2016-10-09 DIAGNOSIS — Z794 Long term (current) use of insulin: Principal | ICD-10-CM

## 2016-10-09 DIAGNOSIS — Z713 Dietary counseling and surveillance: Secondary | ICD-10-CM | POA: Diagnosis not present

## 2016-10-09 DIAGNOSIS — E1142 Type 2 diabetes mellitus with diabetic polyneuropathy: Secondary | ICD-10-CM

## 2016-10-09 DIAGNOSIS — Z6841 Body Mass Index (BMI) 40.0 and over, adult: Secondary | ICD-10-CM | POA: Diagnosis not present

## 2016-10-09 MED ORDER — LEVOTHYROXINE SODIUM 150 MCG PO TABS: ORAL_TABLET | ORAL | 2 refills | Status: DC

## 2016-10-09 MED FILL — LEVOTHYROXINE 150 MCG TAB: 150 | 30 days supply | Qty: 30 | Fill #0 | Status: TO

## 2016-10-09 MED FILL — OLMESARTAN MEDOXOMIL 20 MG: 20 | 30 days supply | Qty: 30 | Fill #0 | Status: TO

## 2016-10-10 ENCOUNTER — Encounter: Payer: Self-pay | Admitting: Skilled Nursing Facility1

## 2016-10-10 NOTE — Progress Notes (Signed)
Pre-Operative Nutrition Class:  Appt start time: 7737   End time:  1830.  Patient was seen on 10/09/2016 for Pre-Operative Bariatric Surgery Education [facility].   Surgery date:  Surgery type: Sleeve Start weight at [facility]: 241.5 Weight today: 239.8  Samples given per MNT protocol. Patient educated on appropriate usage: Bariatric Advantage Multivitamin Lot # [id number] Exp: 06/19  Bariatric Advantage Calcium  Lot # [id number] Exp: dec-19-2018  Renee Pain Protein  Shake Lot # [id number] Exp: may-27-19  The following the learning objectives were met by the patient during this course:  Identify Pre-Op Dietary Goals and will begin 2 weeks pre-operatively  Identify appropriate sources of fluids and proteins   State protein recommendations and appropriate sources pre and post-operatively  Identify Post-Operative Dietary Goals and will follow for 2 weeks post-operatively  Identify appropriate multivitamin and calcium sources  Describe the need for physical activity post-operatively and will follow MD recommendations  State when to call healthcare provider regarding medication questions or post-operative complications  Handouts given during class include:  Pre-Op Bariatric Surgery Diet Handout  Protein Shake Handout  Post-Op Bariatric Surgery Nutrition Handout  BELT Program Information Flyer  Support Group Information Flyer  [facility] Bariatric Supplements Price List  Follow-Up Plan: [facility] diet advancement per MD.

## 2016-10-16 ENCOUNTER — Encounter: Payer: Self-pay | Admitting: Podiatry

## 2016-10-16 ENCOUNTER — Encounter: Payer: Self-pay | Admitting: Endocrinology

## 2016-10-16 ENCOUNTER — Ambulatory Visit (INDEPENDENT_AMBULATORY_CARE_PROVIDER_SITE_OTHER): Payer: 59 | Admitting: Endocrinology

## 2016-10-16 VITALS — BP 122/82 | HR 95 | Wt 245.0 lb

## 2016-10-16 DIAGNOSIS — Z794 Long term (current) use of insulin: Secondary | ICD-10-CM | POA: Diagnosis not present

## 2016-10-16 DIAGNOSIS — E1142 Type 2 diabetes mellitus with diabetic polyneuropathy: Secondary | ICD-10-CM

## 2016-10-16 MED ORDER — ROSUVASTATIN CALCIUM 10 MG PO TABS: 10.0000 mg | ORAL_TABLET | Freq: Every day | ORAL | 3 refills | Status: DC

## 2016-10-16 MED ORDER — INSULIN GLARGINE 100 UNIT/ML SOLOSTAR PEN: 70.0000 [IU] | PEN_INJECTOR | SUBCUTANEOUS | 11 refills | Status: DC

## 2016-10-16 NOTE — Patient Instructions (Addendum)
check your blood sugar once a day.  vary the time of day when you check, between before the 3 meals, and at bedtime.  also check if you have symptoms of your blood sugar being too high or too low.  please keep a record of the readings and bring it to your next appointment here.  You can write it on any piece of paper.  please call us sooner if your blood sugar goes below 70, or if you have a lot of readings over 200.   Please take lantus, 70 units each morning, no matter what your blood sugar is.  Whatever you can do to continue your weight loss, as that helps.  Please come back for a follow-up appointment in 2 months.

## 2016-10-16 NOTE — Progress Notes (Signed)
Subjective:    Patient ID: Stacey Roberts, female    DOB: 22-Oct-1965, 51 y.o.   MRN: [id number]  HPI Pt has stage-1 papillary adenocarcinoma of the thyroid.    2/11: thyroidectomy: T1b N0 M0.  4/11: RAI 103 mci, with thyrogen.  12/11: neck US: single enlarged right cervical lymph node, nonspecific.   6/12: body scan (thyrogen) neg.   12/12: [place]: lymph node is stable.  6/13: [place]: overall node morphology and volume show very little change.   9/14  TG undetectable (ab neg) 3/15: TG undetectable (ab neg) 10/15 TG undetectable (ab neg).  3/17 TG undetectable (ab neg).   Pt returns for f/u of diabetes mellitus: DM type: Insulin-requiring type 2 Dx'ed: 6222 Complications: painful neuropathy of the lower extremities. Therapy: insulin since early 2017 (also byetta and 2 oral meds).   GDM: never.  DKA: never.   Severe hypoglycemia: never.   Pancreatitis: never.  Other: she declines multiple daily injections; edema precudes pioglitizone rx.  Interval history: She takes a varying dosage of lantus (50-80 units QAM, depending on fasting cbg).  She is working towards weight loss surgery.  no cbg record, but states cbg's vary from 140-260.  There is no trend throughout the day.   Past Medical History:  Diagnosis Date  . Allergy    seasonal  . Breast cancer ([place]) 2014  . Breast cancer ([place]) 2017  . Bronchitis    hx of  . Diabetes mellitus, type II ([place])   . GERD (gastroesophageal reflux disease)   . Headache   . History of radiation therapy 12/01/15-01/11/16   Left breast DIBH / 50.4 Gy in 28 fractions and Left breast boost / 12 Gy in 6 fractions  . Hyperlipidemia   . Hypertension   . Hypothyroidism   . Obesity   . Pneumonia    as a child  . Sleep apnea 2008   severe OSA-does not use a cpap  . Snoring   . 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 Buddy Duty    Past Surgical History:   Procedure Laterality Date  . BREAST BIOPSY  2000   s/p  . BREAST BIOPSY Left 01/22/2013   Procedure: RE-EXCISION LEFT BREAST DCIS;  Surgeon: Harl Bowie, MD;  Location: [employer];  Service: General;  Laterality: Left;  . BREAST LUMPECTOMY Left 01/06/2013   Procedure: LUMPECTOMY;  Surgeon: Harl Bowie, MD;  Location: [facility];  Service: General;  Laterality: Left;  . BREAST LUMPECTOMY Left 2017  . BREAST LUMPECTOMY WITH NEEDLE LOCALIZATION AND AXILLARY SENTINEL LYMPH NODE BX Left 09/30/2015   Procedure: Left BREAST LUMPECTOMY WITH NEEDLE LOCALIZATION AND AXILLARY SENTINEL LYMPH NODE BX;  Surgeon: Coralie Keens, MD;  Location: [facility];  Service: General;  Laterality: Left;  . BREAST RECONSTRUCTION Bilateral 10/12/2015   Procedure: BREAST oncoplastic RECONSTRUCTION;  Surgeon: Irene Limbo, MD;  Location: [place];  Service: Plastics;  Laterality: Bilateral;  . BREAST REDUCTION SURGERY Bilateral 10/12/2015   Procedure: MAMMARY REDUCTION  (BREAST);  Surgeon: Irene Limbo, MD;  Location: [facility];  Service: Plastics;  Laterality: Bilateral;  . DILATION AND CURETTAGE OF UTERUS  2004   s/p  . EXCISION OF BREAST LESION Left 10/12/2015   Procedure: Re-EXCISION OF BREAST;  Surgeon: Coralie Keens, MD;  Location: [place];  Service: General;  Laterality: Left;  . REDUCTION MAMMAPLASTY Bilateral 09/2015  .  THYROIDECTOMY  02/2009   Dr Harlow Asa  . TONSILLECTOMY  1982    Social History   Social History  . Marital status: Single    Spouse name: N/A  . Number of children: N/A  . Years of education: N/A   Occupational History  .  [employer]    Outpatient scheduling in radiology   Social History Main Topics  . Smoking status: Former Smoker    Packs/day: 0.50    Types: Cigarettes    Quit date: 09/29/2007  . Smokeless tobacco: Never Used     Comment: Smoked on and off for 20 years. Quit about 8 years ago (as of  08/2015)  . Alcohol use 0.0 oz/week     Comment: occasionally  . Drug use: No  . Sexual activity: Yes   Other Topics Concern  . Not on file   Social History Narrative   The patient is divorced, moved to [place] in   2006 from [place].  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    No alcohol.  Tobacco use:  She quit 5 years ago.  She smoked on   and off for approximately 20 years.  No history of recreational drug   Use.Drinks two cups of coffee per work day.     Current Outpatient Prescriptions on File Prior to Visit  Medication Sig Dispense Refill  . aspirin 81 MG chewable tablet Chew 81 mg by mouth daily.    Marland Kitchen levothyroxine (SYNTHROID, LEVOTHROID) 150 MCG tablet TAKE 1 TABLET BY MOUTH DAILY BEFORE BREAKFAST 30 tablet 0  . LYRICA 75 MG capsule TAKE 1 CAPSULE BY MOUTH TWICE A DAY (Patient not taking: Reported on 10/02/2016) 60 capsule 2  . metFORMIN (GLUCOPHAGE) 1000 MG tablet Take one tablet by mouth two times daily with meals. 60 tablet 5  . olmesartan (BENICAR) 20 MG tablet TAKE 1 TABLET (20 MG TOTAL) BY MOUTH DAILY. 30 tablet 5  . pantoprazole (PROTONIX) 40 MG tablet Take 1 tablet (40 mg total) by mouth daily. 90 tablet 0  . TRUE METRIX BLOOD GLUCOSE TEST test strip USE AS DIRECTED EACH DAY 100 each 0  . [DISCONTINUED] Calcium Carbonate 1500 MG TABS Take 1,500 mg by mouth daily.       No current facility-administered medications on file prior to visit.     Allergies  Allergen Reactions  . Penicillins Shortness Of Breath and Rash    Has patient had a PCN reaction causing immediate rash, facial/tongue/throat swelling, SOB or lightheadedness with hypotension: Yes Has patient had a PCN reaction causing severe rash involving mucus membranes or skin necrosis: Yes Has patient had a PCN reaction that required hospitalization No Has patient had a PCN reaction occurring within the last 10  years: Yes If all of the above answers are "NO", then may proceed with Cephalosporin use.   . Clindamycin/Lincomycin Other (See Comments)    Severe stomach cramps  . Ace Inhibitors Cough    REACTION: cough; denies airway involvement     Family History  Problem Relation Age of Onset  . Dementia Father        deceased age 76 secondary to dementia  . Bipolar disorder Father   . Emphysema Father   . Heart disease Father   . COPD Father        smoker  . CAD Mother 39       Died age  18 of CAD  . Heart disease Mother   . Graves' disease Mother   . CAD Maternal Grandfather 60  . Heart disease Maternal Grandfather   . Other Sister 38       hysterectomy for fibroids  . Prostate cancer Brother        low grade; w/ surveillance  . Thyroid nodules Brother 39  . Fibroids Other        niece dx approx 12  . Hypertension Other   . Kidney cancer Cousin        maternal 1st cousin dx 49-47; former smoker  . Lung cancer Other        maternal great aunt (MGM's sister); not a smoker  . Cancer Other        nephew dx neuroblastoma at 55.62 years old  . Colon cancer Neg Hx   . Colon polyps Neg Hx     BP 122/82   Pulse 95   Wt 245 lb (111.1 kg)   SpO2 97%   BMI 41.73 kg/m    Review of Systems No weight change.  She denies hypoglycemia.     Objective:   Physical Exam VITAL SIGNS:  See vs page GENERAL: no distress Pulses: foot pulses are intact bilaterally.   MSK: no deformity of the feet or ankles.  CV: 1+ bilat edema of the legs.  Skin:  no ulcer on the feet or ankles.  normal color and temp on the feet and ankles Neuro: sensation is intact to touch on the feet and ankles.     Lab Results  Component Value Date   HGBA1C 9.2 (H) 09/28/2016      Assessment & Plan:  Insulin-requiring type 2 DM, with polyneuropathy: worse Obesity: unchanged. I told if she has surgery prior to next ov here, they will send her home on reduced rx, and we'll f/u here.   Patient Instructions    check your blood sugar once a day.  vary the time of day when you check, between before the 3 meals, and at bedtime.  also check if you have symptoms of your blood sugar being too high or too low.  please keep a record of the readings and bring it to your next appointment here.  You can write it on any piece of paper.  please call us sooner if your blood sugar goes below 70, or if you have a lot of readings over 200.   Please take lantus, 70 units each morning, no matter what your blood sugar is.  Whatever you can do to continue your weight loss, as that helps.  Please come back for a follow-up appointment in 2 months.

## 2016-10-19 ENCOUNTER — Telehealth: Payer: Self-pay | Admitting: Podiatry

## 2016-10-19 ENCOUNTER — Other Ambulatory Visit: Payer: Self-pay

## 2016-10-19 MED ORDER — PREGABALIN 75 MG PO CAPS: 75.0000 mg | ORAL_CAPSULE | Freq: Two times a day (BID) | ORAL | 0 refills | Status: DC

## 2016-10-19 MED FILL — ROSUVASTATIN CALCIUM 10 MG: 10 | 90 days supply | Qty: 90 | Fill #0

## 2016-10-19 NOTE — Telephone Encounter (Signed)
I'm a patient of Dr. Stephenie Acres. I was seeing him in [place] but I moved to [place] so I will be seeing him in [place]. I have an appointment on this coming Tuesday. I need a refill on my lyrica and was told he could refill it once I had an appointment. I sent him a message through my chart a few days ago and have not received a response. I was wondering if he could go ahead and refill the medication since I have an appointment on Tuesday. I explained to the pt since she has not been seen since October 2017 that he would not refill it until he see's her but I would be happy to pass a message along to the nurse. Pt requested I pass the message to a nurse. I also told her that Dr. Milinda Pointer is out of the office today and tomorrow and that the nurse would try to reach out to him. Pt requested a call back at [phone].

## 2016-10-23 ENCOUNTER — Telehealth: Payer: Self-pay | Admitting: *Deleted

## 2016-10-23 NOTE — Telephone Encounter (Signed)
Pt requested refill of the Lyrica, and is scheduled for follow up 10/24/2016 with Dr. Milinda Pointer. Emailed confirmation of small amount of refill for Lyrica, due to possible change in dosing.

## 2016-10-24 ENCOUNTER — Ambulatory Visit (INDEPENDENT_AMBULATORY_CARE_PROVIDER_SITE_OTHER): Payer: 59 | Admitting: Podiatry

## 2016-10-24 ENCOUNTER — Encounter: Payer: Self-pay | Admitting: Podiatry

## 2016-10-24 DIAGNOSIS — Z794 Long term (current) use of insulin: Secondary | ICD-10-CM | POA: Diagnosis not present

## 2016-10-24 DIAGNOSIS — E1140 Type 2 diabetes mellitus with diabetic neuropathy, unspecified: Secondary | ICD-10-CM | POA: Diagnosis not present

## 2016-10-24 MED ORDER — PREGABALIN 75 MG PO CAPS: 75.0000 mg | ORAL_CAPSULE | Freq: Two times a day (BID) | ORAL | 11 refills | Status: DC

## 2016-10-24 MED FILL — LYRICA 75 MG CAPSULE: 75 | 30 days supply | Qty: 60 | Fill #0 | Status: TO

## 2016-10-24 NOTE — Progress Notes (Signed)
She presents for follow-up of her diabetic peripheral neuropathy she has recently ran out of her Lyrica and states that she has started burning and tingling in her fingers and toes once again. She states that she is going for gastric weight loss surgery and is looking forward to that. Blood work recently demonstrates hemoglobin A1c at 9.2.  Objective: I reviewed her past medical history medications labs or injuries social history. Pulses remain palpable neuropathic changes have not progressed at this point. Degenerative flexion still intact muscle strength is normal. No open lesions or wounds are noted. Skin is still supple. Hair growth is still    Assessment: Diabetes with diabetic peripheral neuropathy.  Plan: We dispensed another years worth of Lyrica 75 mg 1 twice daily.

## 2016-10-27 ENCOUNTER — Telehealth: Payer: Self-pay | Admitting: *Deleted

## 2016-10-27 ENCOUNTER — Encounter: Payer: Self-pay | Admitting: Endocrinology

## 2016-10-27 ENCOUNTER — Other Ambulatory Visit: Payer: Self-pay | Admitting: Endocrinology

## 2016-10-27 NOTE — Telephone Encounter (Signed)
Patient called and states she sent a Mychart e-mail for a refill of her glucose test strips patient states she needs that RX sent to [facility] outpatient pharmacy . Please advise. Thank you

## 2016-10-27 NOTE — Telephone Encounter (Signed)
Patient notified prescription was sent in.

## 2016-11-06 MED FILL — LEVOTHYROXINE 150 MCG TAB: 150 | 30 days supply | Qty: 30 | Fill #0

## 2016-11-06 MED FILL — LANTUS SOLOSTAR 100 UNITS/M: 100 | 21 days supply | Qty: 15 | Fill #0

## 2016-11-06 MED FILL — OLMESARTAN MEDOXOMIL 20 MG: 20 | 30 days supply | Qty: 30 | Fill #0

## 2016-11-08 ENCOUNTER — Ambulatory Visit: Payer: 59 | Admitting: Oncology

## 2016-11-08 ENCOUNTER — Other Ambulatory Visit: Payer: 59

## 2016-11-13 ENCOUNTER — Other Ambulatory Visit: Payer: Self-pay | Admitting: Internal Medicine

## 2016-11-14 MED FILL — PANTOPRAZOLE SOD DR 40 MG T: 40 | 90 days supply | Qty: 90 | Fill #0

## 2016-11-15 ENCOUNTER — Encounter: Payer: Self-pay | Admitting: Internal Medicine

## 2016-11-15 ENCOUNTER — Encounter: Payer: Self-pay | Admitting: Endocrinology

## 2016-11-23 NOTE — Telephone Encounter (Signed)
We are covered under the focus plan pt has been made aware

## 2016-11-27 MED FILL — LYRICA 75 MG CAPSULE: 75 | 30 days supply | Qty: 60 | Fill #0

## 2016-11-30 ENCOUNTER — Ambulatory Visit: Payer: Self-pay | Admitting: General Surgery

## 2016-11-30 MED FILL — metFORMIN HCL 1000 MG TABS: 1000 | 30 days supply | Qty: 60 | Fill #0

## 2016-11-30 MED FILL — TAMOXIFEN CITRATE 20 MG TAB: 20 | 90 days supply | Qty: 90 | Fill #1

## 2016-11-30 NOTE — Progress Notes (Signed)
Please place orders in Epic as patient is being scheduled for a pre-op appointment! Thank you! 

## 2016-12-08 ENCOUNTER — Other Ambulatory Visit: Payer: Self-pay

## 2016-12-08 MED ORDER — GLUCOSE BLOOD VI STRP: ORAL_STRIP | 2 refills | Status: DC

## 2016-12-08 MED FILL — LANTUS SOLOSTAR 100 UNITS/M: 100 | 21 days supply | Qty: 15 | Fill #1

## 2016-12-08 MED FILL — LEVOTHYROXINE 150 MCG TAB: 150 | 30 days supply | Qty: 30 | Fill #1

## 2016-12-11 ENCOUNTER — Other Ambulatory Visit: Payer: Self-pay

## 2016-12-11 MED FILL — OLMESARTAN MEDOXOMIL 20 MG: 20 | 30 days supply | Qty: 30 | Fill #1

## 2016-12-11 NOTE — Patient Outreach (Signed)
[facility]) Care Management  12/11/2016  Stacey Roberts 1966-01-18 [id number]   Called [name] patient to discuss pre- op needs (case management- a free service through [facility]) - she is scheduled for surgery 12/18/16.  She tells me she decided to cancel the surgery- " it just didn't feel right".  [name] has reached out to meet with a dietitian as she still wants to lose weight.  I have shared the number for Dr. Dennard Nip and have encouraged her to see if she might be a good resource for [name].   Encouraged [name] to reach out to me via the [facility] app for additional support.   Gentry Fitz, RN, BA, [place], [place] Direct Dial:  [phone]  Fax:  [phone] E-mail: [email or web address] [address] [place], [place]  [address]

## 2016-12-12 ENCOUNTER — Encounter (HOSPITAL_COMMUNITY): Admission: RE | Admit: 2016-12-12 | Payer: 59 | Source: Ambulatory Visit

## 2016-12-18 ENCOUNTER — Inpatient Hospital Stay (HOSPITAL_COMMUNITY): Admission: RE | Admit: 2016-12-18 | Payer: 59 | Source: Ambulatory Visit | Admitting: General Surgery

## 2016-12-18 ENCOUNTER — Encounter (HOSPITAL_COMMUNITY): Admission: RE | Payer: Self-pay | Source: Ambulatory Visit

## 2016-12-18 SURGERY — GASTRECTOMY, SLEEVE, LAPAROSCOPIC
Anesthesia: General

## 2016-12-20 NOTE — Progress Notes (Signed)
[facility]  Telephone:([phone] Fax:([phone]     ID: Stacey Roberts DOB: 1965/08/04  MR#: [id number]  GMW#:[id number]  Patient Care Team: [name], NP as PCP - General (Internal Medicine) [name], [name], MD as Consulting Physician (Oncology) [name], MD as Consulting Physician (General Surgery) [name], MD as Consulting Physician (Obstetrics and Gynecology) [name], MD as Consulting Physician (Radiation Oncology) [name], MD as Consulting Physician (Endocrinology) [name], MD as Consulting Physician (Gastroenterology) [name], MD as Consulting Physician (Plastic Surgery) OTHER MD:  CHIEF COMPLAINT: Estrogen receptor positive breast cancer  CURRENT TREATMENT: Tamoxifen   BREAST CANCER HISTORY: From the original intake note:  Stacey Roberts has a 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 She had left lumpectomy for that lesion 01/06/2013, showing a low-grade noninvasive ductal carcinoma which was estrogen receptor 99% and progesterone receptor 100% positive. Margins were focally positive and she had further excision 01/22/2013 which showed residual focal ductal carcinoma in situ 1 mm from the posterior margin. The patient refused adjuvant radiation and did not tolerate tamoxifen, which she took for approximately 2 months. She was last seen in our office in 2015.  Because of that history she receives diagnostic bilateral mammography yearly. Her 02/15/2015 study found the breast density to be category A. This study was negative. However in early August 2017 she developed a new left nipple discharge, which at times was bloody. Repeat left mammography with tomography and left breast ultrasonography 08/30/2015 now read the breast density to be category B. Aside some medial left breast masses and postlumpectomy findings, none of which showed any change since 2012, there were new  pleomorphic calcifications posterior to the lumpectomy area 1.4 cm. On exam there was no palpable lump. Ultrasound showed scarring behind the nipple which was unchanged from prior, but no definite mass.  Biopsy of the left breast upper outer quadrant area of calcifications 09/02/2015 showed ([id number]) ductal carcinoma in situ, grade 1. Focal invasion could not be excluded. There was not enough tissue for a prognostic panel.  On 09/10/2015 the patient underwent bilateral breast MRI with and without contrast. This again found the breast density to be category B. There was an area of non-masslike enhancement involving the upper outer quadrant of the left breast from the nipple posteriorly extending approximately 10 cm. The right breast was unremarkable. There was no evidence of adenopathy. MRI guided biopsy of the upper-outer quadrant retroareolar area of the left breast 09/16/2015 showed fat necrosis but no evidence of malignancy ([id number]).  Stacey Roberts's subsequent history is as detailed below  INTERVAL HISTORY: Stacey Roberts returns today for follow-up and treatment of her estrogen receptor positive breast cancer.. She is tolerating tamoxifen well. She notes that her hot flashes are tolerable, but she notices that they are worse at night. She takes lyrica once a day in the morning. She reports that she doesn't notice a difference in vaginal wetness.  Since her last visit to the office, Leorah underwent Bilateral diagnostic mammography with CAD and tomography on 05/23/2016 [facility] showing: Breast density category B. There was no evidence of malignancy. Fairly extensive postsurgical/post treatment changes on the left.   REVIEW OF SYSTEMS: Stacey Roberts reports that has been sick with a cold with nasal congestion and a bad cough that produces a yellowish mucus. She denies having a fever from this cold. She reports that she usually gets either a cold or bronchitis this time of  year and she usually has to get a  flu shot every year. 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 She denies unusual headaches, visual changes, nausea, vomiting, or dizziness. This been no change in bowel or bladder habits. She denies unexplained fatigue or unexplained weight loss, bleeding, rash, or fever. A detailed review of systems was otherwise stable.   PAST MEDICAL HISTORY: Past Medical History:  Diagnosis Date  . Allergy    seasonal  . Breast cancer (HCC) 2014  . Breast cancer (HCC) 2017  . Bronchitis    hx of  . Diabetes mellitus, type II (HCC)   . GERD (gastroesophageal reflux disease)   . Headache   . History of radiation therapy 12/01/15-01/11/16   Left breast DIBH / 50.4 Gy in 28 fractions and Left breast boost / 12 Gy in 6 fractions  . Hyperlipidemia   . Hypertension   . Hypothyroidism   . Obesity   . Pneumonia    as a child  . Sleep apnea 2008   severe OSA-does not use a cpap  . Snoring   . 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 Sharl Ma    PAST SURGICAL HISTORY: Past Surgical History:  Procedure Laterality Date  . BREAST BIOPSY  2000   s/p  . BREAST BIOPSY Left 01/22/2013   Procedure: RE-EXCISION LEFT BREAST DCIS;  Surgeon: Shelly Rubenstein, MD;  Location: [facility];  Service: General;  Laterality: Left;  . BREAST LUMPECTOMY Left 01/06/2013   Procedure: LUMPECTOMY;  Surgeon: Shelly Rubenstein, MD;  Location: [facility] OR;  Service: General;  Laterality: Left;  . BREAST LUMPECTOMY Left 2017  . BREAST LUMPECTOMY WITH NEEDLE LOCALIZATION AND AXILLARY SENTINEL LYMPH NODE BX Left 09/30/2015   Procedure: Left BREAST LUMPECTOMY WITH NEEDLE LOCALIZATION AND AXILLARY SENTINEL LYMPH NODE BX;  Surgeon: [name], MD;  Location: [facility] OR;  Service: General;  Laterality: Left;  . BREAST RECONSTRUCTION Bilateral 10/12/2015   Procedure: BREAST  oncoplastic RECONSTRUCTION;  Surgeon: [name], MD;  Location: [facility];  Service: Plastics;  Laterality: Bilateral;  . BREAST REDUCTION SURGERY Bilateral 10/12/2015   Procedure: MAMMARY REDUCTION  (BREAST);  Surgeon: [name], MD;  Location: [facility];  Service: Plastics;  Laterality: Bilateral;  . DILATION AND CURETTAGE OF UTERUS  2004   s/p  . EXCISION OF BREAST LESION Left 10/12/2015   Procedure: Re-EXCISION OF BREAST;  Surgeon: [name], MD;  Location: [facility];  Service: General;  Laterality: Left;  . REDUCTION MAMMAPLASTY Bilateral 09/2015  . THYROIDECTOMY  02/2009   Dr Gerrit Friends  . TONSILLECTOMY  1982    FAMILY HISTORY Family History  Problem Relation Age of Onset  . Dementia Father        deceased age 63 secondary to dementia  . Bipolar disorder Father   . Emphysema Father   . Heart disease Father   . COPD Father        smoker  . CAD Mother 14       Died age 80 of CAD  . Heart disease Mother   . Graves' disease Mother   . CAD Maternal Grandfather 60  . Heart disease Maternal Grandfather   . Other Sister 32       hysterectomy for fibroids  . Prostate cancer Brother  low grade; w/ surveillance  . Thyroid nodules Brother 59  . Fibroids Other        niece dx approx 36  . Hypertension Other   . Kidney cancer Cousin        maternal 1st cousin dx 21-47; former smoker  . Lung cancer Other        maternal great aunt (MGM's sister); not a smoker  . Cancer Other        nephew dx neuroblastoma at 23.18 years old  . Colon cancer Neg Hx   . Colon polyps Neg Hx   The patient's father died at the age of 16 from myocardial infarction in the setting of dementia. The patient's mother died at the age of 74 from heart disease. Kaliee has 2 brothers, 2 sisters. There is no history of breast ovarian or colon cancer in the family. Of course she has a history of thyroid cancer  GYNECOLOGIC HISTORY:  No LMP  recorded. Patient is not currently having periods (Reason: IUD). Menarche age 35, she is GX P0. She has a Mirena in place.  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 She is divorced. The patient is not a church attender.    ADVANCED DIRECTIVES: the patient has named her sister Aram Beecham as her healthcare power of attorney  HEALTH MAINTENANCE: Social History   Tobacco Use  . Smoking status: Former Smoker    Packs/day: 0.50    Types: Cigarettes    Last attempt to quit: 09/29/2007    Years since quitting: 9.2  . Smokeless tobacco: Never Used  . Tobacco comment: Smoked on and off for 20 years. Quit about 8 years ago (as of 08/2015)  Substance Use Topics  . Alcohol use: Yes    Alcohol/week: 0.0 oz    Comment: occasionally  . Drug use: No     Colonoscopy:  PAP:  Bone density:   Allergies  Allergen Reactions  . Penicillins Shortness Of Breath, Rash and Other (See Comments)    Has patient had a PCN reaction causing immediate rash, facial/tongue/throat swelling, SOB or lightheadedness with hypotension: Yes Has patient had a PCN reaction causing severe rash involving mucus membranes or skin necrosis: Yes Has patient had a PCN reaction that required hospitalization No Has patient had a PCN reaction occurring within the last 10 years: Yes If all of the above answers are "NO", then may proceed with Cephalosporin use.   . Clindamycin/Lincomycin Other (See Comments)    Severe stomach cramps  . Ace Inhibitors Other (See Comments) and Cough    REACTION: cough; denies airway involvement     Current Outpatient Medications  Medication Sig Dispense Refill  . aspirin 81 MG chewable tablet Chew 81 mg by mouth daily.    . Cholecalciferol (EQL VITAMIN D3) 2000 units CAPS Take 2,000 Units daily by mouth.    Marland Kitchen glucose blood (ACCU-CHEK GUIDE) test strip Used to check blood sugars 1x daily. 100 each 2  . Insulin Glargine (LANTUS SOLOSTAR) 100 UNIT/ML Solostar  Pen Inject 70 Units into the skin every morning. And pen needles 1/day (Patient taking differently: Inject 70 Units every morning into the skin. ) 15 pen 11  . levothyroxine (SYNTHROID, LEVOTHROID) 150 MCG tablet TAKE 1 TABLET BY MOUTH DAILY BEFORE BREAKFAST (Patient taking differently: TAKE 150 MCG BY MOUTH DAILY BEFORE BREAKFAST) 30 tablet 0  . metFORMIN (GLUCOPHAGE) 1000 MG tablet Take one tablet by mouth two times daily with meals. (Patient taking differently:  Take 1,000 mg 2 (two) times daily with a meal by mouth. ) 60 tablet 5  . olmesartan (BENICAR) 20 MG tablet TAKE 1 TABLET (20 MG TOTAL) BY MOUTH DAILY. 30 tablet 5  . pantoprazole (PROTONIX) 40 MG tablet TAKE 1 TABLET (40 MG TOTAL) BY MOUTH DAILY. 90 tablet 2  . pregabalin (LYRICA) 75 MG capsule Take 1 capsule (75 mg total) by mouth 2 (two) times daily. 60 capsule 11  . rosuvastatin (CRESTOR) 10 MG tablet Take 1 tablet (10 mg total) by mouth daily. 90 tablet 3  . tamoxifen (NOLVADEX) 20 MG tablet Take 20 mg daily by mouth.   10   No current facility-administered medications for this visit.     OBJECTIVE: Middle-aged white woman who appears ill Vitals:   12/25/16 1012  BP: 108/90  Pulse: (!) 107  Resp: 18  Temp: 97.6 F (36.4 C)  SpO2: 93%     Body mass index is 42.31 kg/m.    ECOG FS:1 - Symptomatic but completely ambulatory  Sclerae unicteric, EOMs intact Oropharynx clear and moist No cervical or supraclavicular adenopathy Lungs no rales or rhonchi, no wheezes appreciated Heart regular rate and rhythm Abd soft, obese, nontender, positive bowel sounds MSK no focal spinal tenderness, no upper extremity lymphedema Neuro: nonfocal, well oriented, appropriate affect Breasts: The right breast is unremarkable except for reduction mammoplasty.  The left breast is status post reduction mammoplasty as well as lumpectomy and radiation.  There is some distortion of the contour laterally and significant scar tissue.  There is no evidence  of local recurrence.  Both axillae are benign  LAB RESULTS:  CMP     Component Value Date/Time   NA 139 09/28/2016 1451   NA 137 02/18/2013 1554   K 4.3 09/28/2016 1451   K 4.4 02/18/2013 1554   CL 101 09/28/2016 1451   CO2 30 09/28/2016 1451   CO2 25 02/18/2013 1554   GLUCOSE 252 (H) 09/28/2016 1451   GLUCOSE 300 (H) 02/18/2013 1554   BUN 13 09/28/2016 1451   BUN 14.2 02/18/2013 1554   CREATININE 0.65 09/28/2016 1451   CREATININE 0.8 02/18/2013 1554   CALCIUM 9.4 09/28/2016 1451   CALCIUM 9.2 02/18/2013 1554   PROT 7.0 09/28/2016 1451   PROT 7.2 02/18/2013 1554   ALBUMIN 3.9 09/28/2016 1451   ALBUMIN 3.6 02/18/2013 1554   AST 12 09/28/2016 1451   AST 10 02/18/2013 1554   ALT 15 09/28/2016 1451   ALT 16 02/18/2013 1554   ALKPHOS 69 09/28/2016 1451   ALKPHOS 85 02/18/2013 1554   BILITOT 0.3 09/28/2016 1451   BILITOT 0.33 02/18/2013 1554   GFRNONAA >60 09/30/2015 0942   GFRAA >60 09/30/2015 0942    INo results found for: SPEP, UPEP  Lab Results  Component Value Date   WBC 6.2 09/28/2016   NEUTROABS 4.1 09/28/2016   HGB 13.7 09/28/2016   HCT 42.5 09/28/2016   MCV 85.1 09/28/2016   PLT 267.0 09/28/2016      Chemistry      Component Value Date/Time   NA 139 09/28/2016 1451   NA 137 02/18/2013 1554   K 4.3 09/28/2016 1451   K 4.4 02/18/2013 1554   CL 101 09/28/2016 1451   CO2 30 09/28/2016 1451   CO2 25 02/18/2013 1554   BUN 13 09/28/2016 1451   BUN 14.2 02/18/2013 1554   CREATININE 0.65 09/28/2016 1451   CREATININE 0.8 02/18/2013 1554      Component Value Date/Time  CALCIUM 9.4 09/28/2016 1451   CALCIUM 9.2 02/18/2013 1554   ALKPHOS 69 09/28/2016 1451   ALKPHOS 85 02/18/2013 1554   AST 12 09/28/2016 1451   AST 10 02/18/2013 1554   ALT 15 09/28/2016 1451   ALT 16 02/18/2013 1554   BILITOT 0.3 09/28/2016 1451   BILITOT 0.33 02/18/2013 1554       No results found for: LABCA2  No components found for: LABCA125  No results for input(s): INR  in the last 168 hours.  Urinalysis    Component Value Date/Time   COLORURINE YELLOW 03/15/2009 0835   APPEARANCEUR CLOUDY (A) 03/15/2009 0835   LABSPEC 1.024 03/15/2009 0835   PHURINE 7.5 03/15/2009 0835   GLUCOSEU NEGATIVE 03/15/2009 0835   GLUCOSEU NEGATIVE 01/09/2008 1524   HGBUR NEGATIVE 03/15/2009 0835   BILIRUBINUR NEGATIVE 03/15/2009 0835   KETONESUR NEGATIVE 03/15/2009 0835   PROTEINUR NEGATIVE 03/15/2009 0835   UROBILINOGEN 1.0 03/15/2009 0835   NITRITE NEGATIVE 03/15/2009 0835   LEUKOCYTESUR TRACE (A) 03/15/2009 0835     STUDIES: Since her last visit to the office, Stacey Roberts underwent Bilateral diagnostic mammography with CAD and tomography on 05/23/2016 at [facility] showing: Breast density category B. There was no evidence of malignancy. Fairly extensive postsurgical/post treatment changes on the left.  X-ray of the chest on 10/04/2016 showing no edema or consolidation.  Upper GI series with KUB on 10/04/2016 showing Small hiatal hernia without reflux.    ELIGIBLE FOR AVAILABLE RESEARCH PROTOCOL: No  ASSESSMENT: 51 y.o. BRCA negative [place], [place] woman   (1) status post central left breast lumpectomy 01/06/2013 for a 1.5 cm ductal carcinoma in situ, grade 1, estrogen receptor 99% positive, progesterone receptor 100% positive, with a focally positive margin  (2) status post additional surgery for margin clearance 01/22/2013 showed the posterior margin negative at 0.1 cm  (3) the patient decided against adjuvant radiation; she was supposed to have started tamoxifen May 2015,  (4) left breast upper outer quadrant biopsy 09/02/2015 finds ductal carcinoma in situ, grade 1, with not enough tissue for prognostic panel  (a) upper outer quadrant biopsy retroareolar biopsy 09/16/2015, found to fibroadipose tissue and fat necrosis but no evidence of malignancy  (5) genetics testing 09/21/2015 through the [facility] Common Hereditary Cancers Panel (Breast, Gyn, GI) performed by  [facility] [place]) found no deleterious mutations in APC, ATM, AXIN2, BARD1, BMPR1A, BRCA1, BRCA2, BRIP1, CDH1, CDKN2A, CHEK2, DICER1, EPCAM, GREM1, KIT, MEN1, MLH1, MSH2, MSH6, MUTYH, NBN, NF1, PALB2, PDGFRA, PMS2, POLD1, POLE, PTEN, RAD50, RAD51C, RAD51D, SDHA, SDHB, SDHC, SDHD, SMAD4, SMARCA4, STK11, TP53, TSC1, TSC2, and VHL  (6) repeat left lumpectomy and sentinel node biopsyy 10/12/2015 documented a pT2  PN0, stage IIA  Invasive ductal carcinoma, grade 1, with a focally positive margin; estrogen and progesterone receptor positive, HER-2 nonamplified, with an MIB-1 of 3%.  (a)  Left breast reexcision with bilateral oncoplastic reconstruction 10/12/2015 achieved clear margins, with no evidence of malignancy  (7) Oncotype DX score of 3 predicts a risk of recurrence outside the breast in 10 years of 4% if the patient's only systemic therapy is tamoxifen for 5 years. It also predicts no benefit from chemotherapy.  (8) adjuvant radiation11/08/17-12/19/17 Site/dose:  1) Left breast DIBH / 50.4 Gy in 28 fractions                         2) Left breast boost / 12 Gy in 6 fractions  (8) tamoxifen started 03/06/2016  (  a) Mirena IUD in place  PLAN: [name] is now a little over a year out from definitive surgery for her invasive breast cancer, with no evidence of disease recurrence.  This is favorable.  She is still not comfortable with the way her breasts feel and look, but I reassured her today that there is no evidence of disease recurrence or activity.  The cosmetic result overall is good, with very symmetrical breasts after bilateral reduction mammoplasty  She is tolerating tamoxifen well and the plan will be to continue that a minimum of 5 years.  She clearly has been undergoing an upper respiratory viral infection but it now appears to be becoming bacterial.  This is interfering with her work and she has missed many days of work so far.  At this point I think it would be  prudent for her to take a Z-Pak and I am also writing her for some cough syrup  She will have her next mammogram in May.  She will see me again in June.  From that point we will start seeing her on a once a year basis. Demari Gales, [name], MD  12/25/16 10:23 AM Medical Oncology and Hematology [facility] [address] [place], [place] [address] Tel. [phone]    Fax. [phone]  This document serves as a record of services personally performed by Ruthann Cancer, MD. It was created on his behalf by Merideth Abbey, a trained medical scribe. The creation of this record is based on the scribe's personal observations and the provider's statements to them.   I have reviewed the above documentation for accuracy and completeness, and I agree with the above.

## 2016-12-25 ENCOUNTER — Telehealth: Payer: Self-pay | Admitting: Oncology

## 2016-12-25 ENCOUNTER — Ambulatory Visit (HOSPITAL_BASED_OUTPATIENT_CLINIC_OR_DEPARTMENT_OTHER): Payer: 59 | Admitting: Oncology

## 2016-12-25 VITALS — BP 108/90 | HR 107 | Temp 97.6°F | Resp 18 | Ht 64.25 in | Wt 248.4 lb

## 2016-12-25 DIAGNOSIS — C50412 Malignant neoplasm of upper-outer quadrant of left female breast: Secondary | ICD-10-CM

## 2016-12-25 DIAGNOSIS — Z170 Estrogen receptor positive status [ER+]: Secondary | ICD-10-CM

## 2016-12-25 DIAGNOSIS — Z79810 Long term (current) use of selective estrogen receptor modulators (SERMs): Secondary | ICD-10-CM | POA: Diagnosis not present

## 2016-12-25 DIAGNOSIS — D0512 Intraductal carcinoma in situ of left breast: Secondary | ICD-10-CM

## 2016-12-25 DIAGNOSIS — C50112 Malignant neoplasm of central portion of left female breast: Secondary | ICD-10-CM

## 2016-12-25 MED ORDER — PROMETHAZINE-CODEINE 6.25-10 MG/5ML PO SYRP: 5.0000 mL | ORAL_SOLUTION | Freq: Three times a day (TID) | ORAL | 0 refills | Status: DC | PRN

## 2016-12-25 MED ORDER — AZITHROMYCIN 250 MG PO TABS: ORAL_TABLET | ORAL | 0 refills | Status: DC

## 2016-12-25 MED FILL — PROMETHAZINE-CODEINE SYRUP: 6.25-10 | 8 days supply | Qty: 120 | Fill #0

## 2016-12-25 MED FILL — AZITHROMYCIN 250 MG TAB: 250 | 5 days supply | Qty: 6 | Fill #0

## 2016-12-25 NOTE — Telephone Encounter (Signed)
Patient declined AVS and calendar of upcoming June 2019 appointments

## 2016-12-26 MED FILL — LANTUS SOLOSTAR 100 UNITS/M: 100 | 21 days supply | Qty: 15 | Fill #2

## 2017-01-02 ENCOUNTER — Ambulatory Visit: Payer: 59

## 2017-01-03 MED FILL — metFORMIN HCL 1000 MG TABS: 1000 | 30 days supply | Qty: 60 | Fill #1

## 2017-01-08 ENCOUNTER — Other Ambulatory Visit: Payer: Self-pay | Admitting: Internal Medicine

## 2017-01-08 MED FILL — LEVOTHYROXINE 150 MCG TAB: 150 | 30 days supply | Qty: 30 | Fill #0

## 2017-01-08 MED FILL — OLMESARTAN MEDOXOMIL 20 MG: 20 | 30 days supply | Qty: 30 | Fill #2

## 2017-01-08 MED FILL — ROSUVASTATIN CALCIUM 10 MG: 10 | 90 days supply | Qty: 90 | Fill #1

## 2017-01-08 MED FILL — LYRICA 75 MG CAPSULE: 75 | 30 days supply | Qty: 60 | Fill #1

## 2017-01-10 ENCOUNTER — Ambulatory Visit: Payer: 59

## 2017-01-10 ENCOUNTER — Ambulatory Visit (INDEPENDENT_AMBULATORY_CARE_PROVIDER_SITE_OTHER): Payer: 59 | Admitting: Endocrinology

## 2017-01-10 ENCOUNTER — Encounter: Payer: Self-pay | Admitting: Endocrinology

## 2017-01-10 VITALS — BP 130/84 | HR 106 | Wt 252.6 lb

## 2017-01-10 DIAGNOSIS — Z794 Long term (current) use of insulin: Secondary | ICD-10-CM

## 2017-01-10 DIAGNOSIS — E1142 Type 2 diabetes mellitus with diabetic polyneuropathy: Secondary | ICD-10-CM | POA: Diagnosis not present

## 2017-01-10 LAB — POCT GLYCOSYLATED HEMOGLOBIN (HGB A1C): HEMOGLOBIN A1C: 10.4

## 2017-01-10 MED ORDER — INSULIN GLARGINE 100 UNIT/ML SOLOSTAR PEN: 90.0000 [IU] | PEN_INJECTOR | SUBCUTANEOUS | 11 refills | Status: DC

## 2017-01-10 MED ORDER — DULAGLUTIDE 0.75 MG/0.5ML ~~LOC~~ SOAJ: 0.7500 mg | SUBCUTANEOUS | 11 refills | Status: DC

## 2017-01-10 MED ORDER — METFORMIN HCL 1000 MG PO TABS: 1000.0000 mg | ORAL_TABLET | Freq: Two times a day (BID) | ORAL | 3 refills | Status: DC

## 2017-01-10 NOTE — Progress Notes (Signed)
Subjective:    Patient ID: Stacey Roberts, female    DOB: 1965/05/27, 51 y.o.   MRN: [id number]  HPI Pt has stage-1 papillary adenocarcinoma of the thyroid.    2/11: thyroidectomy: T1b N0 M0.  4/11: RAI 103 mCi, with thyrogen.  12/11: neck US: single enlarged right cervical lymph node, nonspecific.   6/12: body scan (thyrogen) neg.   12/12: [place]: lymph node is stable.  6/13: [place]: overall node morphology and volume show very little change.   9/14  TG undetectable (ab neg) 3/15: TG undetectable (ab neg) 10/15 TG undetectable (ab neg).  3/17 TG undetectable (ab neg).   Pt returns for f/u of diabetes mellitus: DM type: Insulin-requiring type 2 Dx'ed: 0865 Complications: painful neuropathy of the lower extremities. Therapy: insulin since early 2017 (also byetta and 2 oral meds).   GDM: never.  DKA: never.   Severe hypoglycemia: never.   Pancreatitis: never.  Other: she declines multiple daily injections; edema precudes pioglitizone rx.  Interval history: She misses the insulin approx once per week.  She takes 70 units qam.  no cbg record, but states cbg's vary from 200-300.  She declines bariatric surgery for now.  She does not take byetta.   Past Medical History:  Diagnosis Date  . Allergy    seasonal  . Breast cancer ([facility]) 2014  . Breast cancer ([place]) 2017  . Bronchitis    hx of  . Diabetes mellitus, type II ([facility])   . GERD (gastroesophageal reflux disease)   . Headache   . History of radiation therapy 12/01/15-01/11/16   Left breast DIBH / 50.4 Gy in 28 fractions and Left breast boost / 12 Gy in 6 fractions  . Hyperlipidemia   . Hypertension   . Hypothyroidism   . Obesity   . Pneumonia    as a child  . Sleep apnea 2008   severe OSA-does not use a cpap  . Snoring   . 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 Buddy Duty    Past Surgical History:  Procedure Laterality Date   . BREAST BIOPSY  2000   s/p  . BREAST BIOPSY Left 01/22/2013   Procedure: RE-EXCISION LEFT BREAST DCIS;  Surgeon: Harl Bowie, MD;  Location: [facility];  Service: General;  Laterality: Left;  . BREAST LUMPECTOMY Left 01/06/2013   Procedure: LUMPECTOMY;  Surgeon: Harl Bowie, MD;  Location: [facility];  Service: General;  Laterality: Left;  . BREAST LUMPECTOMY Left 2017  . BREAST LUMPECTOMY WITH NEEDLE LOCALIZATION AND AXILLARY SENTINEL LYMPH NODE BX Left 09/30/2015   Procedure: Left BREAST LUMPECTOMY WITH NEEDLE LOCALIZATION AND AXILLARY SENTINEL LYMPH NODE BX;  Surgeon: Coralie Keens, MD;  Location: [facility];  Service: General;  Laterality: Left;  . BREAST RECONSTRUCTION Bilateral 10/12/2015   Procedure: BREAST oncoplastic RECONSTRUCTION;  Surgeon: Irene Limbo, MD;  Location: [facility];  Service: Plastics;  Laterality: Bilateral;  . BREAST REDUCTION SURGERY Bilateral 10/12/2015   Procedure: MAMMARY REDUCTION  (BREAST);  Surgeon: Irene Limbo, MD;  Location: [facility];  Service: Plastics;  Laterality: Bilateral;  . DILATION AND CURETTAGE OF UTERUS  2004   s/p  . EXCISION OF BREAST LESION Left 10/12/2015   Procedure: Re-EXCISION OF BREAST;  Surgeon: Coralie Keens, MD;  Location: [facility];  Service: General;  Laterality: Left;  . REDUCTION MAMMAPLASTY Bilateral 09/2015  . THYROIDECTOMY  02/2009   Dr Harlow Asa  . TONSILLECTOMY  1982    Social History   Socioeconomic History  . Marital status: Single    Spouse name: Not on file  . Number of children: Not on file  . Years of education: Not on file  . Highest education level: Not on file  Social Needs  . Financial resource strain: Not on file  . Food insecurity - worry: Not on file  . Food insecurity - inability: Not on file  . Transportation needs - medical: Not on file  . Transportation needs - non-medical: Not on file  Occupational History    Employer:  [employer]    Comment: Outpatient scheduling in radiology  Tobacco Use  . Smoking status: Former Smoker    Packs/day: 0.50    Types: Cigarettes    Last attempt to quit: 09/29/2007    Years since quitting: 9.2  . Smokeless tobacco: Never Used  . Tobacco comment: Smoked on and off for 20 years. Quit about 8 years ago (as of 08/2015)  Substance and Sexual Activity  . Alcohol use: Yes    Alcohol/week: 0.0 oz    Comment: occasionally  . Drug use: No  . Sexual activity: Yes  Other Topics Concern  . Not on file  Social History Narrative   The patient is divorced, moved to [place] in   2006 from [place].  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    No alcohol.  Tobacco use:  She quit 5 years ago.  She smoked on   and off for approximately 20 years.  No history of recreational drug   Use.Drinks two cups of coffee per work day.     Current Outpatient Medications on File Prior to Visit  Medication Sig Dispense Refill  . aspirin 81 MG chewable tablet Chew 81 mg by mouth daily.    . Cholecalciferol (EQL VITAMIN D3) 2000 units CAPS Take 2,000 Units daily by mouth.    Marland Kitchen glucose blood (ACCU-CHEK GUIDE) test strip Used to check blood sugars 1x daily. 100 each 2  . levothyroxine (SYNTHROID, LEVOTHROID) 150 MCG tablet TAKE ONE TABLET BY MOUTH DAILY BEFORE BREAKFAST. 30 tablet 1  . olmesartan (BENICAR) 20 MG tablet TAKE 1 TABLET (20 MG TOTAL) BY MOUTH DAILY. 30 tablet 5  . pantoprazole (PROTONIX) 40 MG tablet TAKE 1 TABLET (40 MG TOTAL) BY MOUTH DAILY. 90 tablet 2  . pregabalin (LYRICA) 75 MG capsule Take 1 capsule (75 mg total) by mouth 2 (two) times daily. 60 capsule 11  . rosuvastatin (CRESTOR) 10 MG tablet Take 1 tablet (10 mg total) by mouth daily. 90 tablet 3  . tamoxifen (NOLVADEX) 20 MG tablet Take 20 mg daily by mouth.   10  . [DISCONTINUED] Calcium Carbonate 1500 MG TABS Take 1,500 mg by mouth  daily.       No current facility-administered medications on file prior to visit.     Allergies  Allergen Reactions  . Penicillins Shortness Of Breath, Rash and Other (See Comments)    Has patient had a PCN reaction causing immediate rash, facial/tongue/throat swelling, SOB or lightheadedness with hypotension: Yes Has patient had a PCN reaction causing severe rash involving mucus membranes or skin necrosis: Yes Has patient had a PCN reaction that required hospitalization No Has patient had a PCN reaction occurring within the last 10 years: Yes If all of the  above answers are "NO", then may proceed with Cephalosporin use.   . Clindamycin/Lincomycin Other (See Comments)    Severe stomach cramps  . Ace Inhibitors Other (See Comments) and Cough    REACTION: cough; denies airway involvement     Family History  Problem Relation Age of Onset  . Dementia Father        deceased age 12 secondary to dementia  . Bipolar disorder Father   . Emphysema Father   . Heart disease Father   . COPD Father        smoker  . CAD Mother 34       Died age 41 of CAD  . Heart disease Mother   . Graves' disease Mother   . CAD Maternal Grandfather 60  . Heart disease Maternal Grandfather   . Other Sister 36       hysterectomy for fibroids  . Prostate cancer Brother        low grade; w/ surveillance  . Thyroid nodules Brother 12  . Fibroids Other        niece dx approx 36  . Hypertension Other   . Kidney cancer Cousin        maternal 1st cousin dx 38-47; former smoker  . Lung cancer Other        maternal great aunt (MGM's sister); not a smoker  . Cancer Other        nephew dx neuroblastoma at 3.1 years old  . Colon cancer Neg Hx   . Colon polyps Neg Hx     BP 130/84 (BP Location: Right Arm, Patient Position: Sitting, Cuff Size: Normal)   Pulse (!) 106   Wt 252 lb 9.6 oz (114.6 kg)   SpO2 95%   BMI 43.02 kg/m   Review of Systems She denies hypoglycemia.      Objective:   Physical  Exam VITAL SIGNS:  See vs page GENERAL: no distress Pulses: dorsalis pedis intact bilat.   MSK: no deformity of the feet CV: trace bilat leg edema Skin:  no ulcer on the feet.  normal color and temp on the feet. Neuro: sensation is intact to touch on the feet   Lab Results  Component Value Date   HGBA1C 10.4 01/10/2017      Assessment & Plan:  Insulin-requiring type 2 DM, with polyneuropathy: worse.   Noncompliance with cbg recording and insulin.  We discussed risks.    Patient Instructions  check your blood sugar once a day.  vary the time of day when you check, between before the 3 meals, and at bedtime.  also check if you have symptoms of your blood sugar being too high or too low.  please keep a record of the readings and bring it to your next appointment here.  You can write it on any piece of paper.  please call us sooner if your blood sugar goes below 70, or if you have a lot of readings over 200.   Please take lantus, 90 units each morning, no matter what your blood sugar is.  I have also sent a prescription to your pharmacy, to add "trulicity."  If you have no nausea, call so we can double it.   Please come back for a follow-up appointment in 2 months.

## 2017-01-10 NOTE — Patient Instructions (Addendum)
check your blood sugar once a day.  vary the time of day when you check, between before the 3 meals, and at bedtime.  also check if you have symptoms of your blood sugar being too high or too low.  please keep a record of the readings and bring it to your next appointment here.  You can write it on any piece of paper.  please call us sooner if your blood sugar goes below 70, or if you have a lot of readings over 200.   Please take lantus, 90 units each morning, no matter what your blood sugar is.  I have also sent a prescription to your pharmacy, to add "trulicity."  If you have no nausea, call so we can double it.   Please come back for a follow-up appointment in 2 months.

## 2017-01-18 MED FILL — LANTUS SOLOSTAR 100 UNITS/M: 100 | 34 days supply | Qty: 30 | Fill #0

## 2017-01-18 MED FILL — TRULICITY 0.75 MG/0.5 ML PE: 0.75 | 28 days supply | Qty: 2 | Fill #0

## 2017-01-30 ENCOUNTER — Encounter: Payer: Self-pay | Admitting: Internal Medicine

## 2017-01-31 MED FILL — LEVOTHYROXINE 150 MCG TAB: 150 | 30 days supply | Qty: 30 | Fill #1

## 2017-01-31 MED FILL — metFORMIN HCL 1000 MG TABS: 1000 | 30 days supply | Qty: 60 | Fill #2

## 2017-02-13 MED FILL — OLMESARTAN MEDOXOMIL 20 MG: 20 | 30 days supply | Qty: 30 | Fill #3 | Status: TO

## 2017-02-13 MED FILL — PANTOPRAZOLE SOD DR 40 MG T: 40 | 90 days supply | Qty: 90 | Fill #1

## 2017-02-13 MED FILL — TRULICITY 0.75 MG/0.5 ML PE: 0.75 | 28 days supply | Qty: 2 | Fill #1

## 2017-02-21 MED FILL — ACCU-CHEK GUIDE TEST STRIP: 90 days supply | Qty: 100 | Fill #0

## 2017-03-02 MED FILL — LYRICA 75 MG CAPSULE: 75 | 30 days supply | Qty: 60 | Fill #2

## 2017-03-02 MED FILL — LANTUS SOLOSTAR 100 UNITS/M: 100 | 34 days supply | Qty: 30 | Fill #1

## 2017-03-02 MED FILL — TAMOXIFEN CITRATE 20 MG TAB: 20 | 90 days supply | Qty: 90 | Fill #2

## 2017-03-02 MED FILL — metFORMIN HCL 1000 MG TABS: 1000 | 30 days supply | Qty: 60 | Fill #3

## 2017-03-06 ENCOUNTER — Other Ambulatory Visit: Payer: Self-pay | Admitting: Endocrinology

## 2017-03-06 ENCOUNTER — Encounter: Payer: Self-pay | Admitting: Internal Medicine

## 2017-03-06 MED FILL — LEVOTHYROXINE 150 MCG TAB: 150 | 30 days supply | Qty: 30 | Fill #0

## 2017-03-08 ENCOUNTER — Encounter (INDEPENDENT_AMBULATORY_CARE_PROVIDER_SITE_OTHER): Payer: Self-pay

## 2017-03-10 IMAGING — DX DG CHEST 2V
2 series · 2 of 2 positions shown · non-contrast
Comparison: PA and lateral chest x-ray January 05, 2015

CLINICAL DATA: Cough, shortness of breath, and wheezing for the
past 3 days. History of breast and thyroid malignancy, chronic
bronchitis, hypertension, former smoker.

EXAM:
CHEST  2 VIEW

[chest pa]
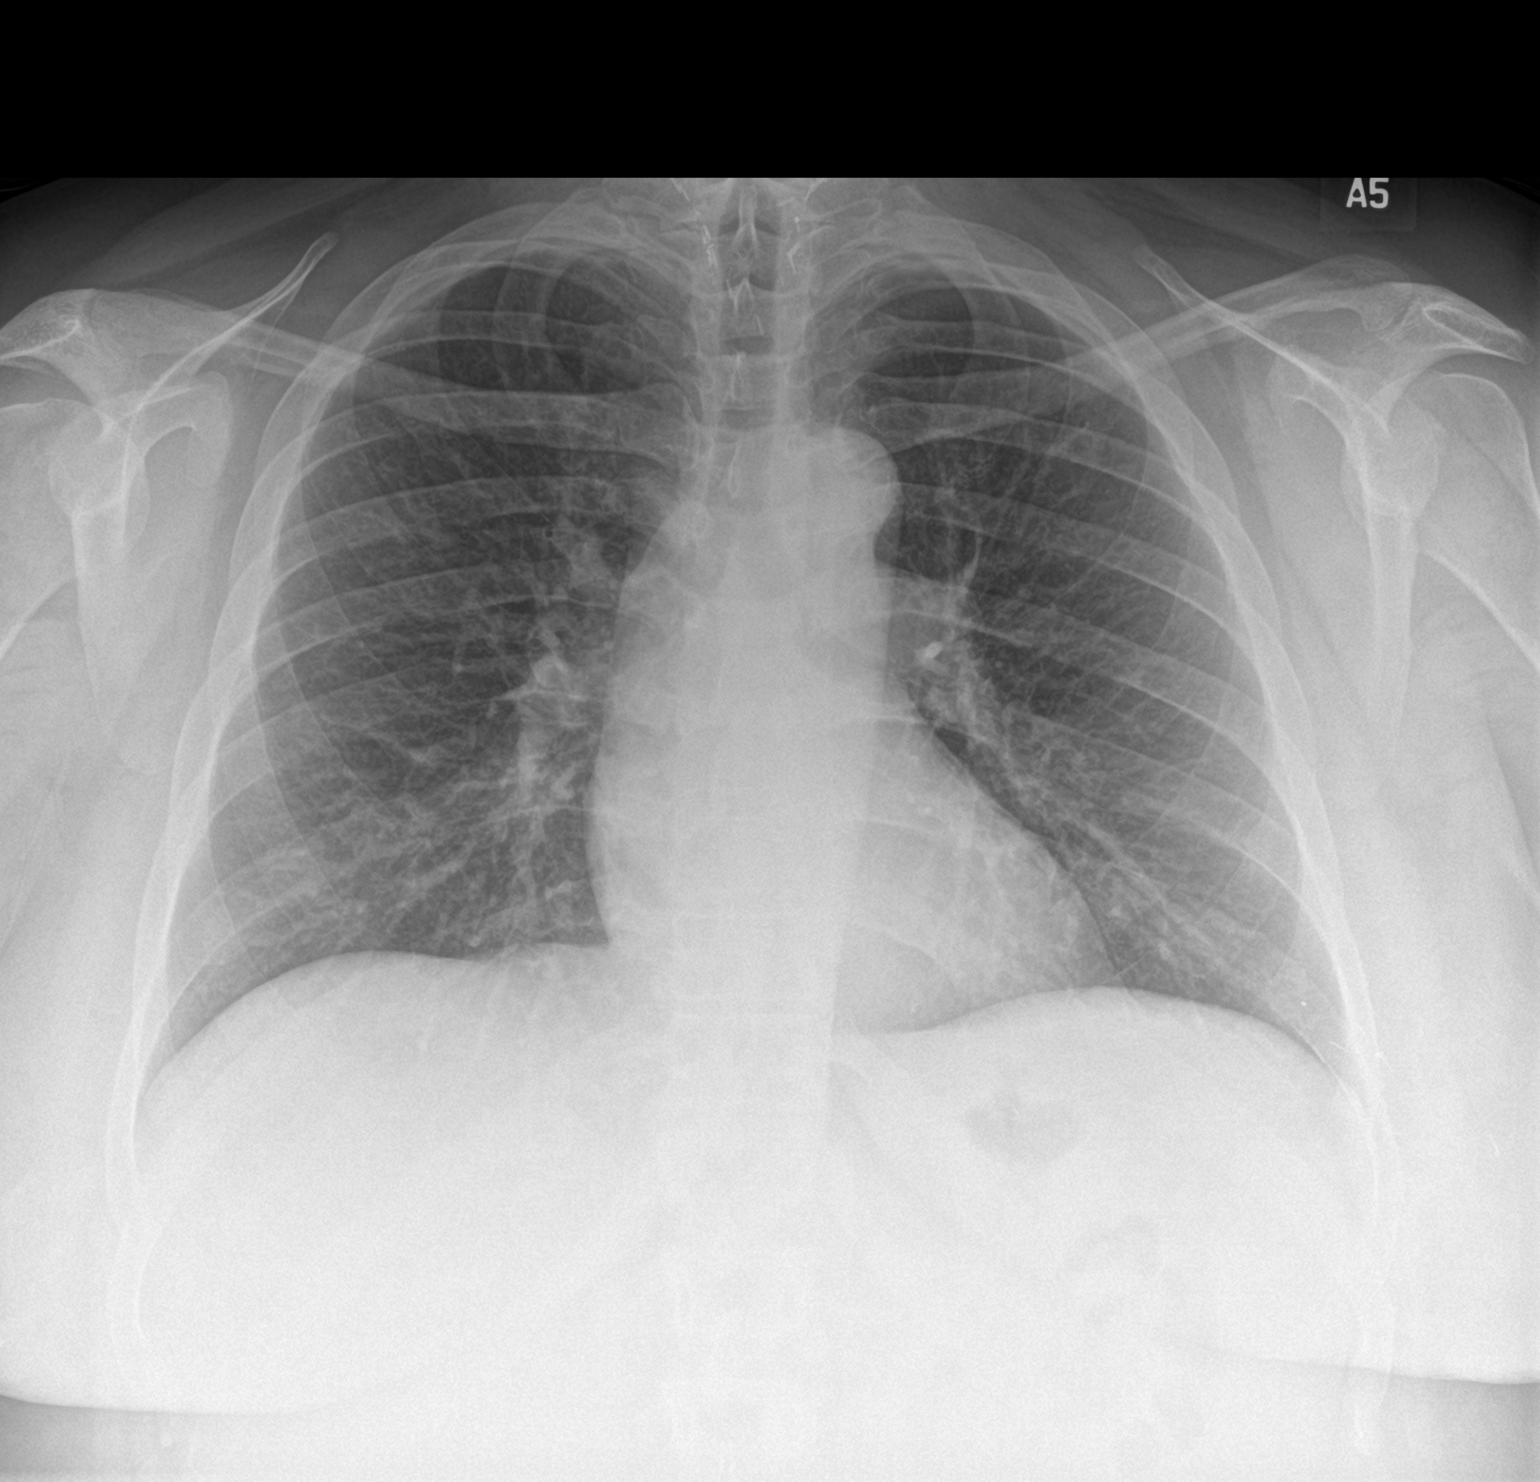

[chest lat]
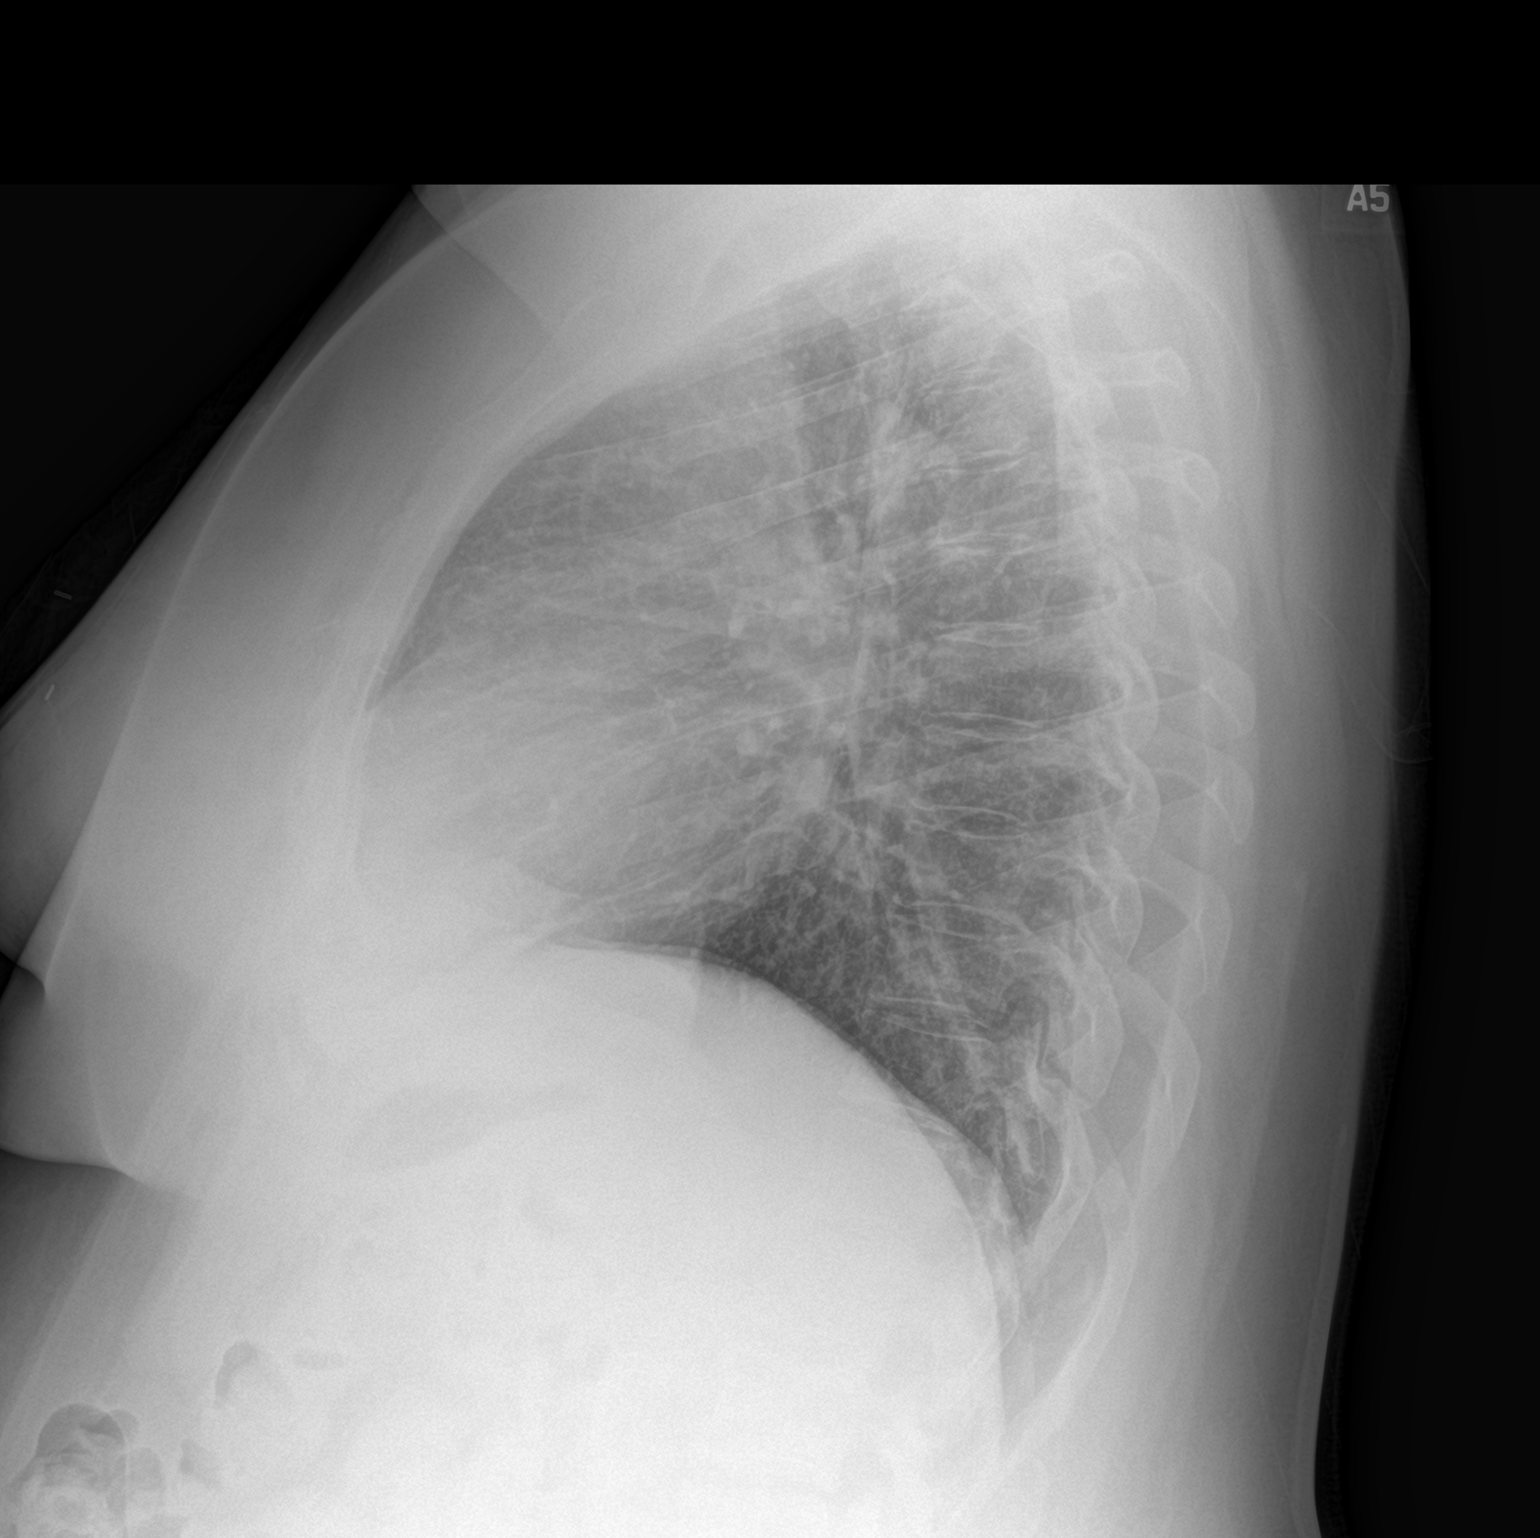

[2 of 2 positions shown; findings below may reference images not displayed]

FINDINGS: The lungs are adequately inflated. There is no focal infiltrate. The
interstitial markings are coarse. The heart and pulmonary
vascularity are normal. The mediastinum is normal in width. There is
no pleural effusion. There is calcification in the wall of the
aortic arch. The bony thorax is unremarkable.
IMPRESSION: Mild bilateral interstitial prominence may reflect subsegmental
atelectasis as might be seen with acute bronchitis. There is no
focal pneumonia. Follow-up radiographs are recommended if the
patient's symptoms do not resolve with anticipated antibiotic
therapy.

Thoracic aortic atherosclerosis.

## 2017-03-15 MED FILL — TRULICITY 0.75 MG/0.5 ML PE: 0.75 | 28 days supply | Qty: 2 | Fill #2

## 2017-03-19 MED FILL — OLMESARTAN MEDOXOMIL 20 MG: 20 | 30 days supply | Qty: 30 | Fill #0

## 2017-03-20 MED FILL — FLUCONAZOLE 150 MG TABLET: 150 | 3 days supply | Qty: 2 | Fill #0

## 2017-03-21 ENCOUNTER — Encounter (INDEPENDENT_AMBULATORY_CARE_PROVIDER_SITE_OTHER): Payer: No Typology Code available for payment source | Admitting: Family Medicine

## 2017-03-21 ENCOUNTER — Ambulatory Visit (INDEPENDENT_AMBULATORY_CARE_PROVIDER_SITE_OTHER): Payer: No Typology Code available for payment source | Admitting: Family Medicine

## 2017-03-21 ENCOUNTER — Encounter (INDEPENDENT_AMBULATORY_CARE_PROVIDER_SITE_OTHER): Payer: Self-pay

## 2017-03-21 ENCOUNTER — Encounter (INDEPENDENT_AMBULATORY_CARE_PROVIDER_SITE_OTHER): Payer: Self-pay | Admitting: Family Medicine

## 2017-03-21 VITALS — BP 128/88 | HR 93 | Temp 97.8°F | Ht 64.0 in | Wt 247.0 lb

## 2017-03-21 DIAGNOSIS — Z0289 Encounter for other administrative examinations: Secondary | ICD-10-CM

## 2017-03-21 DIAGNOSIS — Z1331 Encounter for screening for depression: Secondary | ICD-10-CM

## 2017-03-21 DIAGNOSIS — Z6841 Body Mass Index (BMI) 40.0 and over, adult: Secondary | ICD-10-CM | POA: Diagnosis not present

## 2017-03-21 DIAGNOSIS — R5383 Other fatigue: Secondary | ICD-10-CM | POA: Diagnosis not present

## 2017-03-21 DIAGNOSIS — Z9189 Other specified personal risk factors, not elsewhere classified: Secondary | ICD-10-CM

## 2017-03-21 DIAGNOSIS — I10 Essential (primary) hypertension: Secondary | ICD-10-CM

## 2017-03-21 DIAGNOSIS — E119 Type 2 diabetes mellitus without complications: Secondary | ICD-10-CM | POA: Diagnosis not present

## 2017-03-21 DIAGNOSIS — R0602 Shortness of breath: Secondary | ICD-10-CM

## 2017-03-21 DIAGNOSIS — E66813 Obesity, class 3: Secondary | ICD-10-CM

## 2017-03-21 NOTE — Progress Notes (Signed)
.  Office: [phone]  /  Fax: [phone]   HPI:   Chief Complaint: OBESITY  Stacey Roberts (MR# [id number]) is a 52 y.o. female who presents on 03/21/2017 for obesity evaluation and treatment. Current BMI is There is no height or weight on file to calculate BMI.Stacey Roberts has struggled with obesity for years and has been unsuccessful in either losing weight or maintaining long term weight loss. Stacey Roberts heard about our clinic through [facility]. Stacey Roberts attended our information session and states she is currently in the action stage of change and ready to dedicate time achieving and maintaining a healthier weight.  Stacey Roberts states her desired weight loss is 76 lbs she has been heavy most of  her life she started gaining weight around age 51 when she hit puberty her heaviest weight ever was 282 lbs. she has significant food cravings issues  she snacks frequently in the evenings she skips meals frequently she frequently makes poor food choices she frequently eats larger portions than normal  she has binge eating behaviors she struggles with emotional eating    Fatigue Stacey Roberts feels her energy is lower than it should be. This has worsened with weight gain and has not worsened recently. Stacey Roberts admits to daytime somnolence and  denies waking up still tired. Patient is at risk for obstructive sleep apnea. Patent has a history of symptoms of daytime fatigue and hypertension. Patient generally gets 8 hours of sleep per night, and states they generally have restful sleep. Snoring is present. Apneic episodes are present. Epworth Sleepiness Score is 2 EKG today is within normal limits. Murmur was heard on exam today. Echocardiogram and stress test were done in 2014.  Dyspnea on exertion Stacey Roberts notes increasing shortness of breath with exercising and seems to be worsening over time with weight gain. She notes getting out of breath sooner with activity than she used to. This has not gotten worse recently. EKG was  within normal limits today. Murmur was heard on exam today. Echocardiogram and stress test were done in 2014. Stacey Roberts denies orthopnea.  Hypertension Stacey Roberts is a 52 y.o. female with hypertension. Stacey Roberts denies chest pain, chest pressure or headache. Echocardiogram and stress test  Done in 2014 were both normal. She is attempting to work on weight loss to help control her blood pressure with the goal of decreasing her risk of heart attack and stroke. Stacey Roberts blood pressure is well controlled today.  Diabetes II Stacey Roberts has a diagnosis of diabetes type II. She was just placed on Trulicity 2 months ago. Nyaira states fasting BGs range between 182 and 271 and she  denies any hypoglycemic episodes. She has been working on intensive lifestyle modifications including diet, exercise, and weight loss to help control her blood glucose levels.  At risk for cardiovascular disease Stacey Roberts is at a higher than average risk for cardiovascular disease due to obesity, hypertension and diabetes. She currently denies any chest pain.  Depression Screen Stacey Roberts's Food and Mood (modified PHQ-9) score was  Depression screen PHQ 2/9 03/21/2017  Decreased Interest 1  Down, Depressed, Hopeless 1  PHQ - 2 Score 2  Altered sleeping 0  Tired, decreased energy 1  Change in appetite 2  Feeling bad or failure about yourself  1  Trouble concentrating 0  Moving slowly or fidgety/restless 0  Suicidal thoughts 0  PHQ-9 Score 6  Some recent data might be hidden    ALLERGIES: Allergies  Allergen Reactions  . Penicillins Shortness Of Breath,  Rash and Other (See Comments)    Has patient had a PCN reaction causing immediate rash, facial/tongue/throat swelling, SOB or lightheadedness with hypotension: Yes Has patient had a PCN reaction causing severe rash involving mucus membranes or skin necrosis: Yes Has patient had a PCN reaction that required hospitalization No Has patient had a PCN reaction occurring within the last  10 years: Yes If all of the above answers are "NO", then may proceed with Cephalosporin use.   . Clindamycin/Lincomycin Other (See Comments)    Severe stomach cramps  . Ace Inhibitors Other (See Comments) and Cough    REACTION: cough; denies airway involvement     MEDICATIONS: Current Outpatient Medications on File Prior to Visit  Medication Sig Dispense Refill  . aspirin 81 MG chewable tablet Chew 81 mg by mouth daily.    . Cholecalciferol (EQL VITAMIN D3) 2000 units CAPS Take 2,000 Units daily by mouth.    . Dulaglutide (TRULICITY) 1.51 VO/1.6WV SOPN Inject 0.75 mg into the skin once a week. 4 pen 11  . glucose blood (ACCU-CHEK GUIDE) test strip Used to check blood sugars 1x daily. 100 each 2  . Insulin Glargine (LANTUS SOLOSTAR) 100 UNIT/ML Solostar Pen Inject 90 Units into the skin every morning. And pen needles 1/day 30 pen 11  . levothyroxine (SYNTHROID, LEVOTHROID) 150 MCG tablet TAKE ONE TABLET BY MOUTH DAILY BEFORE BREAKFAST. 30 tablet 1  . metFORMIN (GLUCOPHAGE) 1000 MG tablet Take 1 tablet (1,000 mg total) by mouth 2 (two) times daily with a meal. 180 tablet 3  . olmesartan (BENICAR) 20 MG tablet TAKE 1 TABLET (20 MG TOTAL) BY MOUTH DAILY. 30 tablet 5  . pantoprazole (PROTONIX) 40 MG tablet TAKE 1 TABLET (40 MG TOTAL) BY MOUTH DAILY. 90 tablet 2  . pregabalin (LYRICA) 75 MG capsule Take 1 capsule (75 mg total) by mouth 2 (two) times daily. 60 capsule 11  . rosuvastatin (CRESTOR) 10 MG tablet Take 1 tablet (10 mg total) by mouth daily. 90 tablet 3  . tamoxifen (NOLVADEX) 20 MG tablet Take 20 mg daily by mouth.   10  . [DISCONTINUED] Calcium Carbonate 1500 MG TABS Take 1,500 mg by mouth daily.       No current facility-administered medications on file prior to visit.     PAST MEDICAL HISTORY: Past Medical History:  Diagnosis Date  . Allergy    seasonal  . Breast cancer ([place]) 2014  . Breast cancer ([place]) 2017  . Bronchitis    hx of  . Diabetes mellitus, type II ([place])     . GERD (gastroesophageal reflux disease)   . Headache   . History of radiation therapy 12/01/15-01/11/16   Left breast DIBH / 50.4 Gy in 28 fractions and Left breast boost / 12 Gy in 6 fractions  . Hyperlipidemia   . Hypertension   . Hypothyroidism   . Joint pain   . Obesity   . PCOS (polycystic ovarian syndrome)   . Pneumonia    as a child  . Sleep apnea 2008   severe OSA-does not use a cpap  . Snoring   . 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 Buddy Duty    PAST SURGICAL HISTORY: Past Surgical History:  Procedure Laterality Date  . BREAST BIOPSY  2000   s/p  . BREAST BIOPSY Left 01/22/2013   Procedure: RE-EXCISION LEFT BREAST DCIS;  Surgeon: Harl Bowie, MD;  Location:  [place];  Service: General;  Laterality: Left;  . BREAST LUMPECTOMY Left 01/06/2013   Procedure: LUMPECTOMY;  Surgeon: Harl Bowie, MD;  Location: [place];  Service: General;  Laterality: Left;  . BREAST LUMPECTOMY Left 2017  . BREAST LUMPECTOMY WITH NEEDLE LOCALIZATION AND AXILLARY SENTINEL LYMPH NODE BX Left 09/30/2015   Procedure: Left BREAST LUMPECTOMY WITH NEEDLE LOCALIZATION AND AXILLARY SENTINEL LYMPH NODE BX;  Surgeon: Coralie Keens, MD;  Location: [place];  Service: General;  Laterality: Left;  . BREAST RECONSTRUCTION Bilateral 10/12/2015   Procedure: BREAST oncoplastic RECONSTRUCTION;  Surgeon: Irene Limbo, MD;  Location: [place];  Service: Plastics;  Laterality: Bilateral;  . BREAST REDUCTION SURGERY Bilateral 10/12/2015   Procedure: MAMMARY REDUCTION  (BREAST);  Surgeon: Irene Limbo, MD;  Location: [place];  Service: Plastics;  Laterality: Bilateral;  . DILATION AND CURETTAGE OF UTERUS  2004   s/p  . EXCISION OF BREAST LESION Left 10/12/2015   Procedure: Re-EXCISION OF BREAST;  Surgeon: Coralie Keens, MD;  Location: [place];  Service: General;  Laterality: Left;  . REDUCTION MAMMAPLASTY Bilateral 09/2015  . THYROIDECTOMY  02/2009   Dr Harlow Asa  . TONSILLECTOMY  1982    SOCIAL HISTORY: Social History   Tobacco Use  . Smoking status: Former Smoker    Packs/day: 0.50    Types: Cigarettes    Last attempt to quit: 09/29/2007    Years since quitting: 9.4  . Smokeless tobacco: Never Used  . Tobacco comment: Smoked on and off for 20 years. Quit about 8 years ago (as of 08/2015)  Substance Use Topics  . Alcohol use: Yes    Alcohol/week: 0.0 oz    Comment: occasionally  . Drug use: No    FAMILY HISTORY: Family History  Problem Relation Age of Onset  . Dementia Father        deceased age 28 secondary to dementia  . Bipolar disorder Father   . Emphysema Father   . Heart disease Father   . COPD Father        smoker  . Hypertension Father   . Depression Father   . Alcoholism Father   . CAD Mother 33       Died age 42 of CAD  . Heart disease Mother   . Graves' disease Mother   . Hypertension Mother   . Thyroid disease Mother   . CAD Maternal Grandfather 60  . Heart disease Maternal Grandfather   . Other Sister 34       hysterectomy for fibroids  . Prostate cancer Brother        low grade; w/ surveillance  . Thyroid nodules Brother 64  . Fibroids Other        niece dx approx 62  . Hypertension Other   . Kidney cancer Cousin        maternal 1st cousin dx 53-47; former smoker  . Lung cancer Other        maternal great aunt (MGM's sister); not a smoker  . Cancer Other        nephew dx neuroblastoma at 101.32 years old  . Colon cancer Neg Hx   . Colon polyps Neg Hx     ROS: Review of Systems  HENT:       Nasal Discharge  Eyes:       Wear Glasses  Cardiovascular: Negative for chest pain and orthopnea.       Negative for chest  pressure  Genitourinary: Positive for frequency.  Musculoskeletal:       Muscle or Joint Pain  Skin: Positive for itching.       Dryness     Neurological: Negative for headaches.    PHYSICAL EXAM: There were no vitals taken for this visit. There is no height or weight on file to calculate BMI. Physical Exam  Constitutional: She is oriented to person, place, and time. She appears well-developed and well-nourished.  HENT:  Head: Normocephalic and atraumatic.  Nose: Nose normal.  Eyes: EOM are normal. No scleral icterus.  Neck: Normal range of motion. Neck supple.  + surgical scar anterior midline neck  Cardiovascular: Normal rate.  Murmur heard.  Systolic (heard best at aortic area, non radiating) murmur is present with a grade of 2/6. Pulmonary/Chest: Effort normal. No respiratory distress.  Abdominal: Soft. There is no tenderness.  + obesity  Musculoskeletal: Normal range of motion.  Range of Motion normal in all 4 extremities  Neurological: She is alert and oriented to person, place, and time. Coordination normal.  Skin: Skin is warm and dry.  Psychiatric: She has a normal mood and affect. Her behavior is normal.  Vitals reviewed.   RECENT LABS AND TESTS: BMET    Component Value Date/Time   NA 139 09/28/2016 1451   NA 137 02/18/2013 1554   K 4.3 09/28/2016 1451   K 4.4 02/18/2013 1554   CL 101 09/28/2016 1451   CO2 30 09/28/2016 1451   CO2 25 02/18/2013 1554   GLUCOSE 252 (H) 09/28/2016 1451   GLUCOSE 300 (H) 02/18/2013 1554   BUN 13 09/28/2016 1451   BUN 14.2 02/18/2013 1554   CREATININE 0.65 09/28/2016 1451   CREATININE 0.8 02/18/2013 1554   CALCIUM 9.4 09/28/2016 1451   CALCIUM 9.2 02/18/2013 1554   GFRNONAA >60 09/30/2015 0942   GFRAA >60 09/30/2015 0942   Lab Results  Component Value Date   HGBA1C 10.4 01/10/2017   No results found for: INSULIN CBC    Component Value Date/Time   WBC 6.2 09/28/2016 1451   RBC 4.99 09/28/2016 1451   HGB 13.7 09/28/2016 1451   HGB 12.5 02/18/2013 1554   HCT 42.5 09/28/2016 1451   HCT 39.6 02/18/2013 1554   PLT 267.0 09/28/2016 1451   PLT 306 02/18/2013  1554   MCV 85.1 09/28/2016 1451   MCV 82.6 02/18/2013 1554   MCH 27.3 09/30/2015 0942   MCHC 32.2 09/28/2016 1451   RDW 14.1 09/28/2016 1451   RDW 16.4 (H) 02/18/2013 1554   LYMPHSABS 1.5 09/28/2016 1451   LYMPHSABS 2.0 02/18/2013 1554   MONOABS 0.4 09/28/2016 1451   MONOABS 0.6 02/18/2013 1554   EOSABS 0.1 09/28/2016 1451   EOSABS 0.2 02/18/2013 1554   BASOSABS 0.0 09/28/2016 1451   BASOSABS 0.0 02/18/2013 1554   Iron/TIBC/Ferritin/ %Sat    Component Value Date/Time   IRON 43 09/28/2016 1451   IRONPCTSAT 12.4 (L) 09/28/2016 1451   Lipid Panel     Component Value Date/Time   CHOL 132 09/28/2016 1451   TRIG 161.0 (H) 09/28/2016 1451   HDL 37.40 (L) 09/28/2016 1451   CHOLHDL 4 09/28/2016 1451   VLDL 32.2 09/28/2016 1451   LDLCALC 62 09/28/2016 1451   LDLDIRECT 153.0 09/01/2014 1634   Hepatic Function Panel     Component Value Date/Time   PROT 7.0 09/28/2016 1451   PROT 7.2 02/18/2013 1554   ALBUMIN 3.9 09/28/2016 1451   ALBUMIN 3.6 02/18/2013 1554   AST  12 09/28/2016 1451   AST 10 02/18/2013 1554   ALT 15 09/28/2016 1451   ALT 16 02/18/2013 1554   ALKPHOS 69 09/28/2016 1451   ALKPHOS 85 02/18/2013 1554   BILITOT 0.3 09/28/2016 1451   BILITOT 0.33 02/18/2013 1554   BILIDIR 0.0 11/15/2011 0946      Component Value Date/Time   TSH 0.42 09/28/2016 1451   Vitamin D   Ref. Range 09/28/2016 14:51  VITD Latest Ref Range: 30.00 - 100.00 ng/mL 23.27 (L)    ECG  shows NSR with a rate of 92 BPM INDIRECT CALORIMETER done today shows a VO2 of 216 and a REE of 1505. Her calculated basal metabolic rate is 0109 thus her basal metabolic rate is better than expected.    ASSESSMENT AND PLAN: Type 2 diabetes mellitus without complication, without long-term current use of insulin ([place]) - Plan: Comprehensive metabolic panel, CBC With Differential, Hemoglobin A1c, Insulin, random, Lipid Panel With LDL/HDL Ratio, Microalbumin / creatinine urine ratio  Other fatigue - Plan:  VITAMIN D 25 Hydroxy (Vit-D Deficiency, Fractures), Vitamin B12, Folate, T3, T4, free, TSH  Shortness of breath on exertion  Essential hypertension - Plan: CBC With Differential, Lipid Panel With LDL/HDL Ratio  Depression screening  At risk for heart disease  Class 3 severe obesity with serious comorbidity and body mass index (BMI) of 40.0 to 44.9 in adult, unspecified obesity type (HCC)  PLAN:  Fatigue Aryana was informed that her fatigue may be related to obesity, depression or many other causes. Labs will be ordered, and in the meanwhile Laconya has agreed to work on diet, exercise and weight loss to help with fatigue. Proper sleep hygiene was discussed including the need for 7-8 hours of quality sleep each night. We will order EKG and indirect calorimetry. A sleep study was not ordered based on symptoms and Epworth score.   Dyspnea on exertion Stacey Roberts's shortness of breath appears to be obesity related and exercise induced. She has agreed to work on weight loss and gradually increase exercise to treat her exercise induced shortness of breath. If Stacey Roberts follows our instructions and loses weight without improvement of her shortness of breath, we will plan to refer to pulmonology. We will order EKG, indirect calorimetry and labs. We will monitor this condition regularly. Stacey Roberts agrees to this plan.  Hypertension We discussed sodium restriction, working on healthy weight loss, and a regular exercise program as the means to achieve improved blood pressure control. Stacey Roberts agreed with this plan and agreed to follow up as directed. We will continue to monitor her blood pressure as well as her progress with the above lifestyle modifications. She will continue her medications as prescribed and will watch for signs of hypotension as she continues her lifestyle modifications.  Diabetes II Stacey Roberts has been given extensive diabetes education by myself today including ideal fasting and post-prandial blood glucose  readings, individual ideal Hgb A1c goals and hypoglycemia prevention. We discussed the importance of good blood sugar control to decrease the likelihood of diabetic complications such as nephropathy, neuropathy, limb loss, blindness, coronary artery disease, and death. We discussed the importance of intensive lifestyle modification including diet, exercise and weight loss as the first line treatment for diabetes. We will check Hgb A1c and insulin today. Stacey Roberts agrees to continue her diabetes medications and test blood sugar 2 times daily. Stacey Roberts will follow up at the agreed upon time.  Cardiovascular risk counseling Stacey Roberts was given extended (15 minutes) coronary artery disease prevention counseling today. She is  52 y.o. female and has risk factors for heart disease including obesity, hypertension and diabetes. We discussed intensive lifestyle modifications today with an emphasis on specific weight loss instructions and strategies. Pt was also informed of the importance of increasing exercise and decreasing saturated fats to help prevent heart disease.  Depression Screen Takiya had a mildly positive depression screening. Depression is commonly associated with obesity and often results in emotional eating behaviors. We will monitor this closely and work on CBT to help improve the non-hunger eating patterns. Referral to Psychology may be required if no improvement is seen as she continues in our clinic.  Obesity Odester is currently in the action stage of change and her goal is to continue with weight loss efforts She has agreed to follow the Category 2 plan +100 calories if needed Naika has been instructed to work up to a goal of 150 minutes of combined cardio and strengthening exercise per week for weight loss and overall health benefits. We discussed the following Behavioral Modification Strategies today: increase H2O intake, better snacking choices, planning for success, increasing lean protein intake and work on  meal planning and easy cooking plans  Francy has agreed to follow up with our clinic in 2 weeks. She was informed of the importance of frequent follow up visits to maximize her success with intensive lifestyle modifications for her multiple health conditions. She was informed we would discuss her lab results at her next visit unless there is a critical issue that needs to be addressed sooner. Maikayla agreed to keep her next visit at the agreed upon time to discuss these results.    OBESITY BEHAVIORAL INTERVENTION VISIT  Today's visit was # 1 out of 22.  Starting weight: 247 lbs Starting date: 03/21/17 Today's weight : 247 lbs Today's date: 03/21/2017 Total lbs lost to date: 0 (Patients must lose 7 lbs in the first 6 months to continue with counseling)   ASK: We discussed the diagnosis of obesity with Stacey Roberts today and Libertie agreed to give [place] permission to discuss obesity behavioral modification therapy today.  ASSESS: Margrett has the diagnosis of obesity and her BMI today is 42.38 Brinleigh is in the action stage of change   ADVISE: Sagal was educated on the multiple health risks of obesity as well as the benefit of weight loss to improve her health. She was advised of the need for long term treatment and the importance of lifestyle modifications.  AGREE: Multiple dietary modification options and treatment options were discussed and  Shadasia agreed to the above obesity treatment plan.   I, Doreene Nest, am acting as transcriptionist for Eber Jones, MD    I have reviewed the above documentation for accuracy and completeness, and I agree with the above. - Ilene Qua, MD

## 2017-03-22 LAB — CBC WITH DIFFERENTIAL
BASOS ABS: 0 10*3/uL (ref 0.0–0.2)
Basos: 0 %
EOS (ABSOLUTE): 0.1 10*3/uL (ref 0.0–0.4)
Eos: 1 %
HEMOGLOBIN: 13.5 g/dL (ref 11.1–15.9)
Hematocrit: 42 % (ref 34.0–46.6)
Immature Grans (Abs): 0 10*3/uL (ref 0.0–0.1)
Immature Granulocytes: 0 %
LYMPHS ABS: 1.5 10*3/uL (ref 0.7–3.1)
Lymphs: 19 %
MCH: 27.5 pg (ref 26.6–33.0)
MCHC: 32.1 g/dL (ref 31.5–35.7)
MCV: 86 fL (ref 79–97)
MONOCYTES: 4 %
Monocytes Absolute: 0.3 10*3/uL (ref 0.1–0.9)
NEUTROS ABS: 5.8 10*3/uL (ref 1.4–7.0)
Neutrophils: 76 %
RBC: 4.91 x10E6/uL (ref 3.77–5.28)
RDW: 14.6 % (ref 12.3–15.4)
WBC: 7.7 10*3/uL (ref 3.4–10.8)

## 2017-03-22 LAB — T3: T3, Total: 143 ng/dL (ref 71–180)

## 2017-03-22 LAB — COMPREHENSIVE METABOLIC PANEL
A/G RATIO: 1.3 (ref 1.2–2.2)
ALBUMIN: 3.7 g/dL (ref 3.5–5.5)
ALT: 14 IU/L (ref 0–32)
AST: 9 IU/L (ref 0–40)
Alkaline Phosphatase: 79 IU/L (ref 39–117)
BUN/Creatinine Ratio: 24 — ABNORMAL HIGH (ref 9–23)
BUN: 16 mg/dL (ref 6–24)
Bilirubin Total: 0.3 mg/dL (ref 0.0–1.2)
CALCIUM: 9 mg/dL (ref 8.7–10.2)
CO2: 23 mmol/L (ref 20–29)
Chloride: 99 mmol/L (ref 96–106)
Creatinine, Ser: 0.68 mg/dL (ref 0.57–1.00)
GFR calc Af Amer: 116 mL/min/{1.73_m2} (ref >59)
GFR calc non Af Amer: 101 mL/min/{1.73_m2} (ref >59)
GLOBULIN, TOTAL: 2.8 g/dL (ref 1.5–4.5)
Glucose: 236 mg/dL — ABNORMAL HIGH (ref 65–99)
POTASSIUM: 4.9 mmol/L (ref 3.5–5.2)
SODIUM: 138 mmol/L (ref 134–144)
Total Protein: 6.5 g/dL (ref 6.0–8.5)

## 2017-03-22 LAB — MICROALBUMIN / CREATININE URINE RATIO
CREATININE, UR: 93.1 mg/dL
MICROALB/CREAT RATIO: 4.9 mg/g{creat} (ref 0.0–30.0)
MICROALBUM., U, RANDOM: 4.6 ug/mL

## 2017-03-22 LAB — LIPID PANEL WITH LDL/HDL RATIO
Cholesterol, Total: 122 mg/dL (ref 100–199)
HDL: 41 mg/dL (ref >39)
LDL Calculated: 63 mg/dL (ref 0–99)
LDL/HDL RATIO: 1.5 ratio (ref 0.0–3.2)
TRIGLYCERIDES: 90 mg/dL (ref 0–149)
VLDL Cholesterol Cal: 18 mg/dL (ref 5–40)

## 2017-03-22 LAB — VITAMIN D 25 HYDROXY (VIT D DEFICIENCY, FRACTURES): Vit D, 25-Hydroxy: 35.9 ng/mL (ref 30.0–100.0)

## 2017-03-22 LAB — VITAMIN B12: Vitamin B-12: 407 pg/mL (ref 232–1245)

## 2017-03-22 LAB — HEMOGLOBIN A1C
Est. average glucose Bld gHb Est-mCnc: 220 mg/dL
HEMOGLOBIN A1C: 9.3 % — AB (ref 4.8–5.6)

## 2017-03-22 LAB — T4, FREE: Free T4: 2.02 ng/dL — ABNORMAL HIGH (ref 0.82–1.77)

## 2017-03-22 LAB — FOLATE: Folate: 3.9 ng/mL (ref >3.0)

## 2017-03-22 LAB — TSH: TSH: 0.755 u[IU]/mL (ref 0.450–4.500)

## 2017-03-22 LAB — INSULIN, RANDOM: INSULIN: 46.3 u[IU]/mL — ABNORMAL HIGH (ref 2.6–24.9)

## 2017-04-04 ENCOUNTER — Ambulatory Visit (INDEPENDENT_AMBULATORY_CARE_PROVIDER_SITE_OTHER): Payer: No Typology Code available for payment source | Admitting: Family Medicine

## 2017-04-04 VITALS — BP 110/76 | HR 86 | Temp 97.8°F | Ht 64.0 in | Wt 243.0 lb

## 2017-04-04 DIAGNOSIS — Z6841 Body Mass Index (BMI) 40.0 and over, adult: Secondary | ICD-10-CM

## 2017-04-04 DIAGNOSIS — E119 Type 2 diabetes mellitus without complications: Secondary | ICD-10-CM | POA: Diagnosis not present

## 2017-04-04 DIAGNOSIS — E559 Vitamin D deficiency, unspecified: Secondary | ICD-10-CM | POA: Diagnosis not present

## 2017-04-04 DIAGNOSIS — Z9189 Other specified personal risk factors, not elsewhere classified: Secondary | ICD-10-CM | POA: Diagnosis not present

## 2017-04-04 DIAGNOSIS — Z794 Long term (current) use of insulin: Secondary | ICD-10-CM | POA: Diagnosis not present

## 2017-04-04 MED ORDER — VITAMIN D (ERGOCALCIFEROL) 1.25 MG (50000 UNIT) PO CAPS: 50000.0000 [IU] | ORAL_CAPSULE | ORAL | 0 refills | Status: DC

## 2017-04-04 MED FILL — VIT D2 1.25 MG (50,000 UNIT: 1.25 MG | 28 days supply | Qty: 4 | Fill #0

## 2017-04-09 MED FILL — LEVOTHYROXINE 150 MCG TAB: 150 | 30 days supply | Qty: 30 | Fill #1

## 2017-04-09 MED FILL — metFORMIN HCL 1000 MG TABS: 1000 | 90 days supply | Qty: 180 | Fill #0

## 2017-04-09 NOTE — Progress Notes (Signed)
Office: [phone]  /  Fax: [phone]   HPI:   Chief Complaint: OBESITY Stacey Roberts is here to discuss her progress with her obesity treatment plan. She is on the Category 2 plan + 100 calories and is following her eating plan approximately 95 % of the time. She states she is exercising 0 minutes 0 times per week. Stacey Roberts has done well with weight loss on Category 2 plan. Hunger was mostly controlled but she is feeling bored with dinner and would like more options. She also notes increase evening snacking of salty carbohydrates.  Her weight is 243 lb (110.2 kg) today and has had a weight loss of 4 pounds over a period of 2 weeks since her last visit. She has lost 4 lbs since starting treatment with [place].  Vitamin D Deficiency Stacey Roberts has a new diagnosis of vitamin D deficiency. She is not on Vit D and she notes fatigue and denies nausea, vomiting or muscle weakness.  Diabetes II Stacey Roberts has a diagnosis of diabetes type II. Stacey Roberts is on Trulicity, metformin, and Lantus, [name] at 9.1. Her fasting glucose has decreased from 240's to 140-160's in 2 weeks with diet. She denies hypoglycemia but she did start to self adjust her insulin. She has been working on intensive lifestyle modifications including diet, exercise, and weight loss to help control her blood glucose levels.  At risk for cardiovascular disease Stacey Roberts is at a higher than average risk for cardiovascular disease due to obesity and diabetes II. She currently denies any chest pain.  ALLERGIES: Allergies  Allergen Reactions  . Penicillins Shortness Of Breath, Rash and Other (See Comments)    Has patient had a PCN reaction causing immediate rash, facial/tongue/throat swelling, SOB or lightheadedness with hypotension: Yes Has patient had a PCN reaction causing severe rash involving mucus membranes or skin necrosis: Yes Has patient had a PCN reaction that required hospitalization No Has patient had a PCN reaction occurring within the last 10 years:  Yes If all of the above answers are "NO", then may proceed with Cephalosporin use.   . Clindamycin/Lincomycin Other (See Comments)    Severe stomach cramps  . Ace Inhibitors Other (See Comments) and Cough    REACTION: cough; denies airway involvement     MEDICATIONS: Current Outpatient Medications on File Prior to Visit  Medication Sig Dispense Refill  . aspirin 81 MG chewable tablet Chew 81 mg by mouth daily.    . Cholecalciferol (EQL VITAMIN D3) 2000 units CAPS Take 2,000 Units daily by mouth.    . Dulaglutide (TRULICITY) 2.42 AS/3.4HD SOPN Inject 0.75 mg into the skin once a week. 4 pen 11  . glucose blood (ACCU-CHEK GUIDE) test strip Used to check blood sugars 1x daily. 100 each 2  . Insulin Glargine (LANTUS SOLOSTAR) 100 UNIT/ML Solostar Pen Inject 90 Units into the skin every morning. And pen needles 1/day 30 pen 11  . levothyroxine (SYNTHROID, LEVOTHROID) 150 MCG tablet TAKE ONE TABLET BY MOUTH DAILY BEFORE BREAKFAST. 30 tablet 1  . metFORMIN (GLUCOPHAGE) 1000 MG tablet Take 1 tablet (1,000 mg total) by mouth 2 (two) times daily with a meal. 180 tablet 3  . olmesartan (BENICAR) 20 MG tablet TAKE 1 TABLET (20 MG TOTAL) BY MOUTH DAILY. 30 tablet 5  . pantoprazole (PROTONIX) 40 MG tablet TAKE 1 TABLET (40 MG TOTAL) BY MOUTH DAILY. 90 tablet 2  . pregabalin (LYRICA) 75 MG capsule Take 1 capsule (75 mg total) by mouth 2 (two) times daily. 60 capsule 11  .  rosuvastatin (CRESTOR) 10 MG tablet Take 1 tablet (10 mg total) by mouth daily. 90 tablet 3  . tamoxifen (NOLVADEX) 20 MG tablet Take 20 mg daily by mouth.   10  . [DISCONTINUED] Calcium Carbonate 1500 MG TABS Take 1,500 mg by mouth daily.       No current facility-administered medications on file prior to visit.     PAST MEDICAL HISTORY: Past Medical History:  Diagnosis Date  . Allergy    seasonal  . Breast cancer ([place]) 2014  . Breast cancer ([place]) 2017  . Bronchitis    hx of  . Diabetes mellitus, type II ([place])   . GERD  (gastroesophageal reflux disease)   . Headache   . History of radiation therapy 12/01/15-01/11/16   Left breast DIBH / 50.4 Gy in 28 fractions and Left breast boost / 12 Gy in 6 fractions  . Hyperlipidemia   . Hypertension   . Hypothyroidism   . Joint pain   . Obesity   . PCOS (polycystic ovarian syndrome)   . Pneumonia    as a child  . Sleep apnea 2008   severe OSA-does not use a cpap  . Snoring   . 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 Buddy Duty    PAST SURGICAL HISTORY: Past Surgical History:  Procedure Laterality Date  . BREAST BIOPSY  2000   s/p  . BREAST BIOPSY Left 01/22/2013   Procedure: RE-EXCISION LEFT BREAST DCIS;  Surgeon: Harl Bowie, MD;  Location: [facility];  Service: General;  Laterality: Left;  . BREAST LUMPECTOMY Left 01/06/2013   Procedure: LUMPECTOMY;  Surgeon: Harl Bowie, MD;  Location: [facility];  Service: General;  Laterality: Left;  . BREAST LUMPECTOMY Left 2017  . BREAST LUMPECTOMY WITH NEEDLE LOCALIZATION AND AXILLARY SENTINEL LYMPH NODE BX Left 09/30/2015   Procedure: Left BREAST LUMPECTOMY WITH NEEDLE LOCALIZATION AND AXILLARY SENTINEL LYMPH NODE BX;  Surgeon: Coralie Keens, MD;  Location: [facility];  Service: General;  Laterality: Left;  . BREAST RECONSTRUCTION Bilateral 10/12/2015   Procedure: BREAST oncoplastic RECONSTRUCTION;  Surgeon: Irene Limbo, MD;  Location: [facility];  Service: Plastics;  Laterality: Bilateral;  . BREAST REDUCTION SURGERY Bilateral 10/12/2015   Procedure: MAMMARY REDUCTION  (BREAST);  Surgeon: Irene Limbo, MD;  Location: [facility];  Service: Plastics;  Laterality: Bilateral;  . DILATION AND CURETTAGE OF UTERUS  2004   s/p  . EXCISION OF BREAST LESION Left 10/12/2015   Procedure: Re-EXCISION OF BREAST;  Surgeon: Coralie Keens, MD;  Location: [facility];  Service: General;  Laterality: Left;  . REDUCTION MAMMAPLASTY Bilateral 09/2015  . THYROIDECTOMY  02/2009   Dr Harlow Asa  . TONSILLECTOMY  1982    SOCIAL HISTORY: Social History   Tobacco Use  . Smoking status: Former Smoker    Packs/day: 0.50    Types: Cigarettes    Last attempt to quit: 09/29/2007    Years since quitting: 9.5  . Smokeless tobacco: Never Used  . Tobacco comment: Smoked on and off for 20 years. Quit about 8 years ago (as of 08/2015)  Substance Use Topics  . Alcohol use: Yes    Alcohol/week: 0.0 oz    Comment: occasionally  . Drug use: No    FAMILY HISTORY: Family History  Problem Relation Age of Onset  . Dementia Father        deceased  age 87 secondary to dementia  . Bipolar disorder Father   . Emphysema Father   . Heart disease Father   . COPD Father        smoker  . Hypertension Father   . Depression Father   . Alcoholism Father   . CAD Mother 34       Died age 1 of CAD  . Heart disease Mother   . Graves' disease Mother   . Hypertension Mother   . Thyroid disease Mother   . CAD Maternal Grandfather 60  . Heart disease Maternal Grandfather   . Other Sister 23       hysterectomy for fibroids  . Prostate cancer Brother        low grade; w/ surveillance  . Thyroid nodules Brother 34  . Fibroids Other        niece dx approx 63  . Hypertension Other   . Kidney cancer Cousin        maternal 1st cousin dx 98-47; former smoker  . Lung cancer Other        maternal great aunt (MGM's sister); not a smoker  . Cancer Other        nephew dx neuroblastoma at 60.40 years old  . Colon cancer Neg Hx   . Colon polyps Neg Hx     ROS: Review of Systems  Constitutional: Positive for weight loss.  Cardiovascular: Negative for chest pain.  Gastrointestinal: Negative for nausea and vomiting.  Musculoskeletal:       Negative muscle weakness  Endo/Heme/Allergies:       Negative hypoglycemia    PHYSICAL EXAM: Blood pressure 110/76, pulse 86,  temperature 97.8 F (36.6 C), temperature source Oral, height 5\' 4"  (1.626 m), weight 243 lb (110.2 kg), SpO2 97 %. Body mass index is 41.71 kg/m. Physical Exam  Constitutional: She is oriented to person, place, and time. She appears well-developed and well-nourished.  Cardiovascular: Normal rate.  Pulmonary/Chest: Effort normal.  Musculoskeletal: Normal range of motion.  Neurological: She is oriented to person, place, and time.  Skin: Skin is warm and dry.  Psychiatric: She has a normal mood and affect. Her behavior is normal.  Vitals reviewed.   RECENT LABS AND TESTS: BMET    Component Value Date/Time   NA 138 03/21/2017 1018   NA 137 02/18/2013 1554   K 4.9 03/21/2017 1018   K 4.4 02/18/2013 1554   CL 99 03/21/2017 1018   CO2 23 03/21/2017 1018   CO2 25 02/18/2013 1554   GLUCOSE 236 (H) 03/21/2017 1018   GLUCOSE 252 (H) 09/28/2016 1451   GLUCOSE 300 (H) 02/18/2013 1554   BUN 16 03/21/2017 1018   BUN 14.2 02/18/2013 1554   CREATININE 0.68 03/21/2017 1018   CREATININE 0.8 02/18/2013 1554   CALCIUM 9.0 03/21/2017 1018   CALCIUM 9.2 02/18/2013 1554   GFRNONAA 101 03/21/2017 1018   GFRAA 116 03/21/2017 1018   Lab Results  Component Value Date   HGBA1C 9.3 (H) 03/21/2017   HGBA1C 10.4 01/10/2017   HGBA1C 9.2 (H) 09/28/2016   HGBA1C 8.1 06/14/2016   HGBA1C 10.0 03/14/2016   Lab Results  Component Value Date   INSULIN 46.3 (H) 03/21/2017   CBC    Component Value Date/Time   WBC 7.7 03/21/2017 1018   WBC 6.2 09/28/2016 1451   RBC 4.91 03/21/2017 1018   RBC 4.99 09/28/2016 1451   HGB 13.5 03/21/2017 1018   HGB 12.5 02/18/2013 1554   HCT 42.0 03/21/2017  1018   HCT 39.6 02/18/2013 1554   PLT 267.0 09/28/2016 1451   PLT 306 02/18/2013 1554   MCV 86 03/21/2017 1018   MCV 82.6 02/18/2013 1554   MCH 27.5 03/21/2017 1018   MCH 27.3 09/30/2015 0942   MCHC 32.1 03/21/2017 1018   MCHC 32.2 09/28/2016 1451   RDW 14.6 03/21/2017 1018   RDW 16.4 (H) 02/18/2013 1554     LYMPHSABS 1.5 03/21/2017 1018   LYMPHSABS 2.0 02/18/2013 1554   MONOABS 0.4 09/28/2016 1451   MONOABS 0.6 02/18/2013 1554   EOSABS 0.1 03/21/2017 1018   BASOSABS 0.0 03/21/2017 1018   BASOSABS 0.0 02/18/2013 1554   Iron/TIBC/Ferritin/ %Sat    Component Value Date/Time   IRON 43 09/28/2016 1451   IRONPCTSAT 12.4 (L) 09/28/2016 1451   Lipid Panel     Component Value Date/Time   CHOL 122 03/21/2017 1018   TRIG 90 03/21/2017 1018   HDL 41 03/21/2017 1018   CHOLHDL 4 09/28/2016 1451   VLDL 32.2 09/28/2016 1451   LDLCALC 63 03/21/2017 1018   LDLDIRECT 153.0 09/01/2014 1634   Hepatic Function Panel     Component Value Date/Time   PROT 6.5 03/21/2017 1018   PROT 7.2 02/18/2013 1554   ALBUMIN 3.7 03/21/2017 1018   ALBUMIN 3.6 02/18/2013 1554   AST 9 03/21/2017 1018   AST 10 02/18/2013 1554   ALT 14 03/21/2017 1018   ALT 16 02/18/2013 1554   ALKPHOS 79 03/21/2017 1018   ALKPHOS 85 02/18/2013 1554   BILITOT 0.3 03/21/2017 1018   BILITOT 0.33 02/18/2013 1554   BILIDIR 0.0 11/15/2011 0946      Component Value Date/Time   TSH 0.755 03/21/2017 1018   TSH 0.42 09/28/2016 1451   TSH 0.43 04/02/2015 0813  Results for STEFFIE, WAGGONER (MRN [id number]) as of 04/09/2017 08:06  Ref. Range 03/21/2017 10:18  Vitamin D, 25-Hydroxy Latest Ref Range: 30.0 - 100.0 ng/mL 35.9    ASSESSMENT AND PLAN: Vitamin D deficiency - Plan: Vitamin D, Ergocalciferol, (DRISDOL) 50000 units CAPS capsule  Type 2 diabetes mellitus without complication, with long-term current use of insulin (H[facility])  At risk for heart disease  Class 3 severe obesity with serious comorbidity and body mass index (BMI) of 40.0 to 44.9 in adult, unspecified obesity type ([place])  PLAN:  Vitamin D Deficiency Stacey Roberts was informed that low vitamin D levels contributes to fatigue and are associated with obesity, breast, and colon cancer. Stacey Roberts agrees to start prescription Vit D @50 ,000 IU every week #4 with no refills. She will  follow up for routine testing of vitamin D, at least 2-3 times per year. She was informed of the risk of over-replacement of vitamin D and agrees to not increase her dose unless she discusses this with [place] first. We will recheck labs in 2 months and Stacey Roberts agrees to follow up with our clinic in 2 to 3 weeks.  Diabetes II Stacey Roberts has been given extensive diabetes education by myself today including ideal fasting and post-prandial blood glucose readings, individual ideal Hgb A1c goals and hypoglycemia prevention. We discussed the importance of good blood sugar control to decrease the likelihood of diabetic complications such as nephropathy, neuropathy, limb loss, blindness, coronary artery disease, and death. We discussed the importance of intensive lifestyle modification including diet, exercise and weight loss as the first line treatment for diabetes. Stacey Roberts agrees to decrease lantus to 80 units q daily and she agrees to continue Trulicity and metformin. Stacey Roberts agrees to follow up  with our clinic in 2 to 3 weeks.  Cardiovascular risk counselling Stacey Roberts was given extended (30 minutes) coronary artery disease prevention counseling today. She is 52 y.o. female and has risk factors for heart disease including obesity and diabetes II. We discussed intensive lifestyle modifications today with an emphasis on specific weight loss instructions and strategies. Pt was also informed of the importance of increasing exercise and decreasing saturated fats to help prevent heart disease.  Obesity Stacey Roberts is currently in the action stage of change. As such, her goal is to continue with weight loss efforts She has agreed to follow the Category 2 plan + 100 calories Stacey Roberts has been instructed to work up to a goal of 150 minutes of combined cardio and strengthening exercise per week for weight loss and overall health benefits. We discussed the following Behavioral Modification Strategies today: increasing lean protein intake, work on meal  planning and easy cooking plans, increase H20 intake, and better snacking choices We discussed various dinner recipes to meet her Category 2 plan.  Stacey Roberts has agreed to follow up with our clinic in 2 to 3 weeks. She was informed of the importance of frequent follow up visits to maximize her success with intensive lifestyle modifications for her multiple health conditions.   OBESITY BEHAVIORAL INTERVENTION VISIT  Today's visit was # 3 out of 22.  Starting weight: 247 lbs Starting date: 03/21/17 Today's weight : 243 lbs  Today's date: 04/04/2017 Total lbs lost to date: 4 (Patients must lose 7 lbs in the first 6 months to continue with counseling)   ASK: We discussed the diagnosis of obesity with Stacey Roberts today and Stacey Roberts agreed to give [place] permission to discuss obesity behavioral modification therapy today.  ASSESS: Stacey Roberts has the diagnosis of obesity and her BMI today is 41.69 Stacey Roberts is in the action stage of change   ADVISE: Stacey Roberts was educated on the multiple health risks of obesity as well as the benefit of weight loss to improve her health. She was advised of the need for long term treatment and the importance of lifestyle modifications.  AGREE: Multiple dietary modification options and treatment options were discussed and  Stacey Roberts agreed to the above obesity treatment plan.  I, Trixie Dredge, am acting as transcriptionist for Dennard Nip, MD  I have reviewed the above documentation for accuracy and completeness, and I agree with the above. -Dennard Nip, MD

## 2017-04-11 ENCOUNTER — Ambulatory Visit (INDEPENDENT_AMBULATORY_CARE_PROVIDER_SITE_OTHER): Payer: Self-pay | Admitting: Family Medicine

## 2017-04-13 MED FILL — LANTUS SOLOSTAR 100 UNITS/M: 100 | 34 days supply | Qty: 30 | Fill #2 | Status: TO

## 2017-04-13 MED FILL — TRULICITY 0.75 MG/0.5 ML PE: 0.75 | 28 days supply | Qty: 2 | Fill #3

## 2017-04-16 ENCOUNTER — Other Ambulatory Visit: Payer: Self-pay | Admitting: Internal Medicine

## 2017-04-16 MED FILL — ROSUVASTATIN CALCIUM 10 MG: 10 | 90 days supply | Qty: 90 | Fill #2

## 2017-04-19 NOTE — Telephone Encounter (Signed)
Benicar is on back order, alternative was requested via fax from the pharmacy... Please advise

## 2017-04-20 MED ORDER — LOSARTAN POTASSIUM 50 MG PO TABS: 50.0000 mg | ORAL_TABLET | Freq: Every day | ORAL | 1 refills | Status: DC

## 2017-04-20 MED FILL — LOSARTAN POTASSIUM 50 MG TA: 50 | 60 days supply | Qty: 60 | Fill #0 | Status: TO

## 2017-04-20 NOTE — Telephone Encounter (Signed)
Losartan sent to pharmacy.

## 2017-04-20 NOTE — Addendum Note (Signed)
Addended by: Jearld Fenton on: 04/20/2017 12:51 PM   Modules accepted: Orders

## 2017-04-24 ENCOUNTER — Other Ambulatory Visit (INDEPENDENT_AMBULATORY_CARE_PROVIDER_SITE_OTHER): Payer: No Typology Code available for payment source

## 2017-04-24 ENCOUNTER — Encounter: Payer: Self-pay | Admitting: Endocrinology

## 2017-04-24 ENCOUNTER — Ambulatory Visit (INDEPENDENT_AMBULATORY_CARE_PROVIDER_SITE_OTHER): Payer: No Typology Code available for payment source | Admitting: Endocrinology

## 2017-04-24 VITALS — BP 130/90 | HR 97 | Wt 246.8 lb

## 2017-04-24 DIAGNOSIS — E890 Postprocedural hypothyroidism: Secondary | ICD-10-CM

## 2017-04-24 DIAGNOSIS — E1142 Type 2 diabetes mellitus with diabetic polyneuropathy: Secondary | ICD-10-CM

## 2017-04-24 DIAGNOSIS — Z794 Long term (current) use of insulin: Secondary | ICD-10-CM | POA: Diagnosis not present

## 2017-04-24 DIAGNOSIS — C73 Malignant neoplasm of thyroid gland: Secondary | ICD-10-CM

## 2017-04-24 LAB — T4, FREE: FREE T4: 1.23 ng/dL (ref 0.60–1.60)

## 2017-04-24 LAB — TSH: TSH: 0.33 u[IU]/mL — AB (ref 0.35–4.50)

## 2017-04-24 LAB — POCT GLYCOSYLATED HEMOGLOBIN (HGB A1C): Hemoglobin A1C: 8

## 2017-04-24 MED ORDER — INSULIN GLARGINE 100 UNIT/ML SOLOSTAR PEN: 70.0000 [IU] | PEN_INJECTOR | SUBCUTANEOUS | 11 refills | Status: DC

## 2017-04-24 MED ORDER — GLUCOSE BLOOD VI STRP: 1.0000 | ORAL_STRIP | Freq: Two times a day (BID) | 3 refills | Status: DC

## 2017-04-24 MED ORDER — DULAGLUTIDE 1.5 MG/0.5ML ~~LOC~~ SOAJ: 1.5000 mg | SUBCUTANEOUS | 11 refills | Status: DC

## 2017-04-24 NOTE — Progress Notes (Signed)
Subjective:    Patient ID: Stacey Roberts, female    DOB: 1965-04-05, 52 y.o.   MRN: [id number]  HPI Pt has stage-1 papillary adenocarcinoma of the thyroid.    2/11: thyroidectomy: T1b N0 M0.  4/11: RAI 103 mCi, with thyrogen.  12/11: neck US: single enlarged right cervical lymph node, nonspecific.   6/12: body scan (thyrogen) neg.   12/12: [place]: lymph node is stable.  6/13: [place]: overall node morphology and volume show very little change.   9/14  TG undetectable (ab neg) 3/15: TG undetectable (ab neg) 10/15 TG undetectable (ab neg).  3/17 TG undetectable (ab neg).    Pt returns for f/u of diabetes mellitus: DM type: Insulin-requiring type 2 Dx'ed: 5397 Complications: painful neuropathy of the lower extremities. Therapy: insulin since early 2017 (also byetta and 2 oral meds).   GDM: never.  DKA: never.   Severe hypoglycemia: never.   Pancreatitis: never.  Other: she declines multiple daily injections; edema precudes pioglitizone rx.  She declines bariatric surgery for now. Interval history: She never misses the insulin.  no cbg record, but states cbg's vary from 80-180.   She does not take byetta. Past Medical History:  Diagnosis Date  . Allergy    seasonal  . Breast cancer ([place]) 2014  . Breast cancer ([place]) 2017  . Bronchitis    hx of  . Diabetes mellitus, type II ([place])   . GERD (gastroesophageal reflux disease)   . Headache   . History of radiation therapy 12/01/15-01/11/16   Left breast DIBH / 50.4 Gy in 28 fractions and Left breast boost / 12 Gy in 6 fractions  . Hyperlipidemia   . Hypertension   . Hypothyroidism   . Joint pain   . Obesity   . PCOS (polycystic ovarian syndrome)   . Pneumonia    as a child  . Sleep apnea 2008   severe OSA-does not use a cpap  . Snoring   . 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 Buddy Duty    Past Surgical History:  Procedure  Laterality Date  . BREAST BIOPSY  2000   s/p  . BREAST BIOPSY Left 01/22/2013   Procedure: RE-EXCISION LEFT BREAST DCIS;  Surgeon: Harl Bowie, MD;  Location: [facility];  Service: General;  Laterality: Left;  . BREAST LUMPECTOMY Left 01/06/2013   Procedure: LUMPECTOMY;  Surgeon: Harl Bowie, MD;  Location: [facility];  Service: General;  Laterality: Left;  . BREAST LUMPECTOMY Left 2017  . BREAST LUMPECTOMY WITH NEEDLE LOCALIZATION AND AXILLARY SENTINEL LYMPH NODE BX Left 09/30/2015   Procedure: Left BREAST LUMPECTOMY WITH NEEDLE LOCALIZATION AND AXILLARY SENTINEL LYMPH NODE BX;  Surgeon: Coralie Keens, MD;  Location: [facility];  Service: General;  Laterality: Left;  . BREAST RECONSTRUCTION Bilateral 10/12/2015   Procedure: BREAST oncoplastic RECONSTRUCTION;  Surgeon: Irene Limbo, MD;  Location: [facility];  Service: Plastics;  Laterality: Bilateral;  . BREAST REDUCTION SURGERY Bilateral 10/12/2015   Procedure: MAMMARY REDUCTION  (BREAST);  Surgeon: Irene Limbo, MD;  Location: [facility];  Service: Plastics;  Laterality: Bilateral;  . DILATION AND CURETTAGE OF UTERUS  2004   s/p  . EXCISION OF BREAST LESION Left 10/12/2015   Procedure: Re-EXCISION OF BREAST;  Surgeon: Coralie Keens, MD;  Location: [facility];  Service: General;  Laterality: Left;  . REDUCTION MAMMAPLASTY Bilateral 09/2015  .  THYROIDECTOMY  02/2009   Dr Harlow Asa  . TONSILLECTOMY  1982    Social History   Socioeconomic History  . Marital status: Single    Spouse name: Not on file  . Number of children: Not on file  . Years of education: Not on file  . Highest education level: Not on file  Occupational History    Employer: [employer]    Comment: Outpatient scheduling in radiology  Social Needs  . Financial resource strain: Not on file  . Food insecurity:    Worry: Not on file    Inability: Not on file  . Transportation needs:    Medical:  Not on file    Non-medical: Not on file  Tobacco Use  . Smoking status: Former Smoker    Packs/day: 0.50    Types: Cigarettes    Last attempt to quit: 09/29/2007    Years since quitting: 9.5  . Smokeless tobacco: Never Used  . Tobacco comment: Smoked on and off for 20 years. Quit about 8 years ago (as of 08/2015)  Substance and Sexual Activity  . Alcohol use: Yes    Alcohol/week: 0.0 oz    Comment: occasionally  . Drug use: No  . Sexual activity: Yes  Lifestyle  . Physical activity:    Days per week: Not on file    Minutes per session: Not on file  . Stress: Not on file  Relationships  . Social connections:    Talks on phone: Not on file    Gets together: Not on file    Attends religious service: Not on file    Active member of club or organization: Not on file    Attends meetings of clubs or organizations: Not on file    Relationship status: Not on file  . Intimate partner violence:    Fear of current or ex partner: Not on file    Emotionally abused: Not on file    Physically abused: Not on file    Forced sexual activity: Not on file  Other Topics Concern  . Not on file  Social History Narrative   The patient is divorced, moved to [place] in   2006 from [place].  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    No alcohol.  Tobacco use:  She quit 5 years ago.  She smoked on   and off for approximately 20 years.  No history of recreational drug   Use.Drinks two cups of coffee per work day.     Current Outpatient Medications on File Prior to Visit  Medication Sig Dispense Refill  . aspirin 81 MG chewable tablet Chew 81 mg by mouth daily.    . Cholecalciferol (EQL VITAMIN D3) 2000 units CAPS Take 2,000 Units daily by mouth.    . levothyroxine (SYNTHROID, LEVOTHROID) 150 MCG tablet TAKE ONE TABLET BY MOUTH DAILY BEFORE BREAKFAST. 30 tablet 1  . losartan (COZAAR) 50 MG tablet Take 1  tablet (50 mg total) by mouth daily. 30 tablet 1  . metFORMIN (GLUCOPHAGE) 1000 MG tablet Take 1 tablet (1,000 mg total) by mouth 2 (two) times daily with a meal. 180 tablet 3  . olmesartan (BENICAR) 20 MG tablet TAKE 1 TABLET BY MOUTH DAILY 30 tablet 4  . pantoprazole (PROTONIX) 40 MG tablet TAKE 1 TABLET (40 MG TOTAL) BY MOUTH DAILY. 90 tablet 2  . pregabalin (LYRICA) 75 MG capsule  Take 1 capsule (75 mg total) by mouth 2 (two) times daily. 60 capsule 11  . rosuvastatin (CRESTOR) 10 MG tablet Take 1 tablet (10 mg total) by mouth daily. 90 tablet 3  . tamoxifen (NOLVADEX) 20 MG tablet Take 20 mg daily by mouth.   10  . Vitamin D, Ergocalciferol, (DRISDOL) 50000 units CAPS capsule Take 1 capsule (50,000 Units total) by mouth every 7 (seven) days. 4 capsule 0  . [DISCONTINUED] Calcium Carbonate 1500 MG TABS Take 1,500 mg by mouth daily.       No current facility-administered medications on file prior to visit.     Allergies  Allergen Reactions  . Penicillins Shortness Of Breath, Rash and Other (See Comments)    Has patient had a PCN reaction causing immediate rash, facial/tongue/throat swelling, SOB or lightheadedness with hypotension: Yes Has patient had a PCN reaction causing severe rash involving mucus membranes or skin necrosis: Yes Has patient had a PCN reaction that required hospitalization No Has patient had a PCN reaction occurring within the last 10 years: Yes If all of the above answers are "NO", then may proceed with Cephalosporin use.   . Clindamycin/Lincomycin Other (See Comments)    Severe stomach cramps  . Ace Inhibitors Other (See Comments) and Cough    REACTION: cough; denies airway involvement     Family History  Problem Relation Age of Onset  . Dementia Father        deceased age 36 secondary to dementia  . Bipolar disorder Father   . Emphysema Father   . Heart disease Father   . COPD Father        smoker  . Hypertension Father   . Depression Father   .  Alcoholism Father   . CAD Mother 74       Died age 39 of CAD  . Heart disease Mother   . Graves' disease Mother   . Hypertension Mother   . Thyroid disease Mother   . CAD Maternal Grandfather 60  . Heart disease Maternal Grandfather   . Other Sister 72       hysterectomy for fibroids  . Prostate cancer Brother        low grade; w/ surveillance  . Thyroid nodules Brother 70  . Fibroids Other        niece dx approx 9  . Hypertension Other   . Kidney cancer Cousin        maternal 1st cousin dx 48-47; former smoker  . Lung cancer Other        maternal great aunt (MGM's sister); not a smoker  . Cancer Other        nephew dx neuroblastoma at 23.68 years old  . Colon cancer Neg Hx   . Colon polyps Neg Hx     BP 130/90 (BP Location: Right Arm, Patient Position: Sitting, Cuff Size: Normal)   Pulse 97   Wt 246 lb 12.8 oz (111.9 kg)   BMI 42.36 kg/m    Review of Systems She has lost a few lbs.      Objective:   Physical Exam VITAL SIGNS:  See vs page GENERAL: no distress Pulses: dorsalis pedis intact bilat.   MSK: no deformity of the feet CV: no leg edema Skin:  no ulcer on the feet.  normal color and temp on the feet. Neuro: sensation is intact to touch on the feet.     A1c=8.0%    Assessment & Plan:  Insulin-requiring type 2 DM,  with polyneuropathy: she needs increased rx. Obesity: persistent.  We'll increase trulicity Thyroid cancer: due for recheck.   Patient Instructions  check your blood sugar once a day.  vary the time of day when you check, between before the 3 meals, and at bedtime.  also check if you have symptoms of your blood sugar being too high or too low.  please keep a record of the readings and bring it to your next appointment here.  You can write it on any piece of paper.  please call us sooner if your blood sugar goes below 70, or if you have a lot of readings over 200.   I have also sent a prescription to your pharmacy, to double the trulicity.  If  you have no nausea, call so we can double it.   Also, please reduce the lantus to 70 units each morning.   blood tests are requested for you today.  We'll let you know about the results.  Please come back for a follow-up appointment in 2 months.

## 2017-04-24 NOTE — Patient Instructions (Addendum)
check your blood sugar once a day.  vary the time of day when you check, between before the 3 meals, and at bedtime.  also check if you have symptoms of your blood sugar being too high or too low.  please keep a record of the readings and bring it to your next appointment here.  You can write it on any piece of paper.  please call us sooner if your blood sugar goes below 70, or if you have a lot of readings over 200.   I have also sent a prescription to your pharmacy, to double the trulicity.  If you have no nausea, call so we can double it.   Also, please reduce the lantus to 70 units each morning.   blood tests are requested for you today.  We'll let you know about the results.  Please come back for a follow-up appointment in 2 months.

## 2017-04-25 ENCOUNTER — Ambulatory Visit (INDEPENDENT_AMBULATORY_CARE_PROVIDER_SITE_OTHER): Payer: No Typology Code available for payment source | Admitting: Family Medicine

## 2017-04-25 VITALS — BP 120/82 | HR 98 | Temp 98.0°F | Ht 64.0 in | Wt 242.0 lb

## 2017-04-25 DIAGNOSIS — Z6841 Body Mass Index (BMI) 40.0 and over, adult: Secondary | ICD-10-CM | POA: Diagnosis not present

## 2017-04-25 DIAGNOSIS — E119 Type 2 diabetes mellitus without complications: Secondary | ICD-10-CM

## 2017-04-25 DIAGNOSIS — I10 Essential (primary) hypertension: Secondary | ICD-10-CM

## 2017-04-25 LAB — THYROGLOBULIN ANTIBODY: Thyroglobulin Ab: <1 IU/mL (ref <=1)

## 2017-04-25 LAB — THYROGLOBULIN LEVEL: Thyroglobulin: <0.1 ng/mL — ABNORMAL LOW

## 2017-04-25 MED FILL — TRULICITY 1.5 MG/0.5 ML PEN: 1.5 | 28 days supply | Qty: 2 | Fill #0

## 2017-04-26 ENCOUNTER — Other Ambulatory Visit: Payer: Self-pay | Admitting: Oncology

## 2017-04-26 DIAGNOSIS — Z9889 Other specified postprocedural states: Secondary | ICD-10-CM

## 2017-04-26 NOTE — Progress Notes (Signed)
Office: [phone]  /  Fax: [phone]   HPI:   Chief Complaint: OBESITY Stacey Roberts is here to discuss her progress with her obesity treatment plan. She is on the Category 2 plan +100 calories and is following her eating plan approximately 98 % of the time. She states she is walking 4000 to 4500 steps 5 days per week. Stacey Roberts went to see her endocrinologist yesterday. She is struggling with limiting snack portion sizes and is occassionally getting greater than 200 calorie snacks after dinner. Her weight is 242 lb (109.8 kg) today and has had a weight loss of 1 pound over a period of 3 weeks since her last visit. She has lost 5 lbs since starting treatment with [place].  Diabetes II Stacey Roberts has a diagnosis of diabetes type II and is currently on metformin, lantus and trulicity.Stacey Roberts states fasting BGs range between 80 and 188, post prandial between 121 and 228 and she denies any hypoglycemic episodes. [name] yesterday was at 8.0 (previously > 9). She has been working on intensive lifestyle modifications including diet, exercise, and weight loss to help control her blood glucose levels.  Hypertension Stacey Roberts is a 52 y.o. female with hypertension. Benicar is back ordered and she hasn't been on blood pressure medications for 2 weeks. Stacey Roberts denies chest pain or shortness of breath on exertion. She is working weight loss to help control her blood pressure with the goal of decreasing her risk of heart attack and stroke. Tonis blood pressure is currently controlled.  ALLERGIES: Allergies  Allergen Reactions  . Penicillins Shortness Of Breath, Rash and Other (See Comments)    Has patient had a PCN reaction causing immediate rash, facial/tongue/throat swelling, SOB or lightheadedness with hypotension: Yes Has patient had a PCN reaction causing severe rash involving mucus membranes or skin necrosis: Yes Has patient had a PCN reaction that required hospitalization No Has patient had a PCN  reaction occurring within the last 10 years: Yes If all of the above answers are "NO", then may proceed with Cephalosporin use.   . Clindamycin/Lincomycin Other (See Comments)    Severe stomach cramps  . Ace Inhibitors Other (See Comments) and Cough    REACTION: cough; denies airway involvement     MEDICATIONS: Current Outpatient Medications on File Prior to Visit  Medication Sig Dispense Refill  . aspirin 81 MG chewable tablet Chew 81 mg by mouth daily.    . Cholecalciferol (EQL VITAMIN D3) 2000 units CAPS Take 2,000 Units daily by mouth.    . Dulaglutide (TRULICITY) 1.5 NL/8.9QJ SOPN Inject 1.5 mg into the skin once a week. 4 pen 11  . glucose blood (ACCU-CHEK GUIDE) test strip 1 each by Other route 2 (two) times daily. Used to check blood sugars 1x daily. 180 each 3  . Insulin Glargine (LANTUS SOLOSTAR) 100 UNIT/ML Solostar Pen Inject 70 Units into the skin every morning. And pen needles 1/day 30 pen 11  . levothyroxine (SYNTHROID, LEVOTHROID) 150 MCG tablet TAKE ONE TABLET BY MOUTH DAILY BEFORE BREAKFAST. 30 tablet 1  . losartan (COZAAR) 50 MG tablet Take 1 tablet (50 mg total) by mouth daily. 30 tablet 1  . metFORMIN (GLUCOPHAGE) 1000 MG tablet Take 1 tablet (1,000 mg total) by mouth 2 (two) times daily with a meal. 180 tablet 3  . olmesartan (BENICAR) 20 MG tablet TAKE 1 TABLET BY MOUTH DAILY 30 tablet 4  . pantoprazole (PROTONIX) 40 MG tablet TAKE 1 TABLET (40 MG TOTAL) BY MOUTH DAILY.  90 tablet 2  . pregabalin (LYRICA) 75 MG capsule Take 1 capsule (75 mg total) by mouth 2 (two) times daily. 60 capsule 11  . rosuvastatin (CRESTOR) 10 MG tablet Take 1 tablet (10 mg total) by mouth daily. 90 tablet 3  . tamoxifen (NOLVADEX) 20 MG tablet Take 20 mg daily by mouth.   10  . Vitamin D, Ergocalciferol, (DRISDOL) 50000 units CAPS capsule Take 1 capsule (50,000 Units total) by mouth every 7 (seven) days. 4 capsule 0  . [DISCONTINUED] Calcium Carbonate 1500 MG TABS Take 1,500 mg by mouth  daily.       No current facility-administered medications on file prior to visit.     PAST MEDICAL HISTORY: Past Medical History:  Diagnosis Date  . Allergy    seasonal  . Breast cancer ([place]) 2014  . Breast cancer ([place]) 2017  . Bronchitis    hx of  . Diabetes mellitus, type II ([place])   . GERD (gastroesophageal reflux disease)   . Headache   . History of radiation therapy 12/01/15-01/11/16   Left breast DIBH / 50.4 Gy in 28 fractions and Left breast boost / 12 Gy in 6 fractions  . Hyperlipidemia   . Hypertension   . Hypothyroidism   . Joint pain   . Obesity   . PCOS (polycystic ovarian syndrome)   . Pneumonia    as a child  . Sleep apnea 2008   severe OSA-does not use a cpap  . Snoring   . 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 Buddy Duty    PAST SURGICAL HISTORY: Past Surgical History:  Procedure Laterality Date  . BREAST BIOPSY  2000   s/p  . BREAST BIOPSY Left 01/22/2013   Procedure: RE-EXCISION LEFT BREAST DCIS;  Surgeon: Harl Bowie, MD;  Location: [facility];  Service: General;  Laterality: Left;  . BREAST LUMPECTOMY Left 01/06/2013   Procedure: LUMPECTOMY;  Surgeon: Harl Bowie, MD;  Location: [facility];  Service: General;  Laterality: Left;  . BREAST LUMPECTOMY Left 2017  . BREAST LUMPECTOMY WITH NEEDLE LOCALIZATION AND AXILLARY SENTINEL LYMPH NODE BX Left 09/30/2015   Procedure: Left BREAST LUMPECTOMY WITH NEEDLE LOCALIZATION AND AXILLARY SENTINEL LYMPH NODE BX;  Surgeon: Coralie Keens, MD;  Location: [facility];  Service: General;  Laterality: Left;  . BREAST RECONSTRUCTION Bilateral 10/12/2015   Procedure: BREAST oncoplastic RECONSTRUCTION;  Surgeon: Irene Limbo, MD;  Location: [facility];  Service: Plastics;  Laterality: Bilateral;  . BREAST REDUCTION SURGERY Bilateral 10/12/2015   Procedure: MAMMARY REDUCTION   (BREAST);  Surgeon: Irene Limbo, MD;  Location: [facility];  Service: Plastics;  Laterality: Bilateral;  . DILATION AND CURETTAGE OF UTERUS  2004   s/p  . EXCISION OF BREAST LESION Left 10/12/2015   Procedure: Re-EXCISION OF BREAST;  Surgeon: Coralie Keens, MD;  Location: [facility];  Service: General;  Laterality: Left;  . REDUCTION MAMMAPLASTY Bilateral 09/2015  . THYROIDECTOMY  02/2009   Dr Harlow Asa  . TONSILLECTOMY  1982    SOCIAL HISTORY: Social History   Tobacco Use  . Smoking status: Former Smoker    Packs/day: 0.50    Types: Cigarettes    Last attempt to quit: 09/29/2007    Years since quitting: 9.5  . Smokeless tobacco: Never Used  . Tobacco comment: Smoked on and off for 20 years. Quit about 8 years ago (as  of 08/2015)  Substance Use Topics  . Alcohol use: Yes    Alcohol/week: 0.0 oz    Comment: occasionally  . Drug use: No    FAMILY HISTORY: Family History  Problem Relation Age of Onset  . Dementia Father        deceased age 73 secondary to dementia  . Bipolar disorder Father   . Emphysema Father   . Heart disease Father   . COPD Father        smoker  . Hypertension Father   . Depression Father   . Alcoholism Father   . CAD Mother 66       Died age 77 of CAD  . Heart disease Mother   . Graves' disease Mother   . Hypertension Mother   . Thyroid disease Mother   . CAD Maternal Grandfather 60  . Heart disease Maternal Grandfather   . Other Sister 63       hysterectomy for fibroids  . Prostate cancer Brother        low grade; w/ surveillance  . Thyroid nodules Brother 36  . Fibroids Other        niece dx approx 84  . Hypertension Other   . Kidney cancer Cousin        maternal 1st cousin dx 38-47; former smoker  . Lung cancer Other        maternal great aunt (MGM's sister); not a smoker  . Cancer Other        nephew dx neuroblastoma at 53.63 years old  . Colon cancer Neg Hx   . Colon polyps Neg Hx     ROS: Review  of Systems  Constitutional: Positive for weight loss.  Respiratory: Negative for shortness of breath (on exertion).   Cardiovascular: Negative for chest pain.  Endo/Heme/Allergies:       Negative for hypoglycemia    PHYSICAL EXAM: Blood pressure 120/82, pulse 98, temperature 98 F (36.7 C), temperature source Oral, height 5\' 4"  (1.626 m), weight 242 lb (109.8 kg), SpO2 98 %. Body mass index is 41.54 kg/m. Physical Exam  Constitutional: She is oriented to person, place, and time. She appears well-developed and well-nourished.  Cardiovascular: Normal rate.  Pulmonary/Chest: Effort normal.  Musculoskeletal: Normal range of motion.  Neurological: She is oriented to person, place, and time.  Skin: Skin is warm and dry.  Psychiatric: She has a normal mood and affect. Her behavior is normal.  Vitals reviewed.   RECENT LABS AND TESTS: BMET    Component Value Date/Time   NA 138 03/21/2017 1018   NA 137 02/18/2013 1554   K 4.9 03/21/2017 1018   K 4.4 02/18/2013 1554   CL 99 03/21/2017 1018   CO2 23 03/21/2017 1018   CO2 25 02/18/2013 1554   GLUCOSE 236 (H) 03/21/2017 1018   GLUCOSE 252 (H) 09/28/2016 1451   GLUCOSE 300 (H) 02/18/2013 1554   BUN 16 03/21/2017 1018   BUN 14.2 02/18/2013 1554   CREATININE 0.68 03/21/2017 1018   CREATININE 0.8 02/18/2013 1554   CALCIUM 9.0 03/21/2017 1018   CALCIUM 9.2 02/18/2013 1554   GFRNONAA 101 03/21/2017 1018   GFRAA 116 03/21/2017 1018   Lab Results  Component Value Date   HGBA1C 8.0 04/24/2017   HGBA1C 9.3 (H) 03/21/2017   HGBA1C 10.4 01/10/2017   HGBA1C 9.2 (H) 09/28/2016   HGBA1C 8.1 06/14/2016   Lab Results  Component Value Date   INSULIN 46.3 (H) 03/21/2017   CBC  Component Value Date/Time   WBC 7.7 03/21/2017 1018   WBC 6.2 09/28/2016 1451   RBC 4.91 03/21/2017 1018   RBC 4.99 09/28/2016 1451   HGB 13.5 03/21/2017 1018   HGB 12.5 02/18/2013 1554   HCT 42.0 03/21/2017 1018   HCT 39.6 02/18/2013 1554   PLT 267.0  09/28/2016 1451   PLT 306 02/18/2013 1554   MCV 86 03/21/2017 1018   MCV 82.6 02/18/2013 1554   MCH 27.5 03/21/2017 1018   MCH 27.3 09/30/2015 0942   MCHC 32.1 03/21/2017 1018   MCHC 32.2 09/28/2016 1451   RDW 14.6 03/21/2017 1018   RDW 16.4 (H) 02/18/2013 1554   LYMPHSABS 1.5 03/21/2017 1018   LYMPHSABS 2.0 02/18/2013 1554   MONOABS 0.4 09/28/2016 1451   MONOABS 0.6 02/18/2013 1554   EOSABS 0.1 03/21/2017 1018   BASOSABS 0.0 03/21/2017 1018   BASOSABS 0.0 02/18/2013 1554   Iron/TIBC/Ferritin/ %Sat    Component Value Date/Time   IRON 43 09/28/2016 1451   IRONPCTSAT 12.4 (L) 09/28/2016 1451   Lipid Panel     Component Value Date/Time   CHOL 122 03/21/2017 1018   TRIG 90 03/21/2017 1018   HDL 41 03/21/2017 1018   CHOLHDL 4 09/28/2016 1451   VLDL 32.2 09/28/2016 1451   LDLCALC 63 03/21/2017 1018   LDLDIRECT 153.0 09/01/2014 1634   Hepatic Function Panel     Component Value Date/Time   PROT 6.5 03/21/2017 1018   PROT 7.2 02/18/2013 1554   ALBUMIN 3.7 03/21/2017 1018   ALBUMIN 3.6 02/18/2013 1554   AST 9 03/21/2017 1018   AST 10 02/18/2013 1554   ALT 14 03/21/2017 1018   ALT 16 02/18/2013 1554   ALKPHOS 79 03/21/2017 1018   ALKPHOS 85 02/18/2013 1554   BILITOT 0.3 03/21/2017 1018   BILITOT 0.33 02/18/2013 1554   BILIDIR 0.0 11/15/2011 0946      Component Value Date/Time   TSH 0.33 (L) 04/24/2017 1501   TSH 0.755 03/21/2017 1018   TSH 0.42 09/28/2016 1451   Results for DEYSHA, CARTIER (MRN [id number]) as of 04/26/2017 09:17  Ref. Range 03/21/2017 10:18  Vitamin D, 25-Hydroxy Latest Ref Range: 30.0 - 100.0 ng/mL 35.9   ASSESSMENT AND PLAN: Type 2 diabetes mellitus without complication, without long-term current use of insulin (HCC)  Essential hypertension  Class 3 severe obesity with serious comorbidity and body mass index (BMI) of 40.0 to 44.9 in adult, unspecified obesity type ([place])  PLAN:  Diabetes II Setareh has been given extensive diabetes education by  myself today including ideal fasting and post-prandial blood glucose readings, individual ideal Hgb A1c goals and hypoglycemia prevention. We discussed the importance of good blood sugar control to decrease the likelihood of diabetic complications such as nephropathy, neuropathy, limb loss, blindness, coronary artery disease, and death. We discussed the importance of intensive lifestyle modification including diet, exercise and weight loss as the first line treatment for diabetes. Azlyn will continue lantus 70 units once daily and she is now bumping trulicity to 1.5 mg once weekly. Jyasia will follow up at the agreed upon time.  Hypertension We discussed sodium restriction, working on healthy weight loss, and a regular exercise program as the means to achieve improved blood pressure control. Valma agreed with this plan and agreed to follow up as directed. We will continue to monitor her blood pressure as well as her progress with the above lifestyle modifications. She will test her blood pressure 2 to 3 times per week and MyChart  if her blood pressure is low.   We spent > than 50% of the 15 minute visit on the counseling as documented in the note.  Obesity Amamda is currently in the action stage of change. As such, her goal is to continue with weight loss efforts She has agreed to follow the Category 2 plan Alaya has been instructed to work up to a goal of 150 minutes of combined cardio and strengthening exercise per week for weight loss and overall health benefits. We discussed the following Behavioral Modification Strategies today: planning for success, better snacking choices, increase H2O intake, increasing lean protein intake and work on meal planning and easy cooking plans  Karmina has agreed to follow up with our clinic in 2 weeks. She was informed of the importance of frequent follow up visits to maximize her success with intensive lifestyle modifications for her multiple health conditions.   OBESITY  BEHAVIORAL INTERVENTION VISIT  Today's visit was # 4 out of 22.  Starting weight: 247 lbs Starting date: 03/21/17 Today's weight : 242 lbs Today's date: 04/25/2017 Total lbs lost to date: 5 (Patients must lose 7 lbs in the first 6 months to continue with counseling)   ASK: We discussed the diagnosis of obesity with Stacey Roberts today and Delila agreed to give [place] permission to discuss obesity behavioral modification therapy today.  ASSESS: Cherrill has the diagnosis of obesity and her BMI today is 41.52 Zamantha is in the action stage of change   ADVISE: Shawana was educated on the multiple health risks of obesity as well as the benefit of weight loss to improve her health. She was advised of the need for long term treatment and the importance of lifestyle modifications.  AGREE: Multiple dietary modification options and treatment options were discussed and  Yamileth agreed to the above obesity treatment plan.  I, Doreene Nest, am acting as transcriptionist for Eber Jones, MD  I have reviewed the above documentation for accuracy and completeness, and I agree with the above. - Ilene Qua, MD

## 2017-04-30 ENCOUNTER — Other Ambulatory Visit: Payer: Self-pay | Admitting: Endocrinology

## 2017-04-30 MED FILL — ACCU-CHEK GUIDE TEST STRIP: 90 days supply | Qty: 100 | Fill #1

## 2017-05-14 ENCOUNTER — Ambulatory Visit (INDEPENDENT_AMBULATORY_CARE_PROVIDER_SITE_OTHER): Payer: No Typology Code available for payment source | Admitting: Family Medicine

## 2017-05-14 VITALS — BP 128/83 | HR 92 | Temp 97.9°F | Ht 64.0 in | Wt 237.0 lb

## 2017-05-14 DIAGNOSIS — E559 Vitamin D deficiency, unspecified: Secondary | ICD-10-CM | POA: Diagnosis not present

## 2017-05-14 DIAGNOSIS — Z9189 Other specified personal risk factors, not elsewhere classified: Secondary | ICD-10-CM | POA: Diagnosis not present

## 2017-05-14 DIAGNOSIS — Z6841 Body Mass Index (BMI) 40.0 and over, adult: Secondary | ICD-10-CM | POA: Diagnosis not present

## 2017-05-14 DIAGNOSIS — I10 Essential (primary) hypertension: Secondary | ICD-10-CM | POA: Diagnosis not present

## 2017-05-14 DIAGNOSIS — Z794 Long term (current) use of insulin: Secondary | ICD-10-CM | POA: Diagnosis not present

## 2017-05-14 DIAGNOSIS — E119 Type 2 diabetes mellitus without complications: Secondary | ICD-10-CM | POA: Diagnosis not present

## 2017-05-14 MED ORDER — VITAMIN D (ERGOCALCIFEROL) 1.25 MG (50000 UNIT) PO CAPS: 50000.0000 [IU] | ORAL_CAPSULE | ORAL | 0 refills | Status: DC

## 2017-05-14 MED FILL — LEVOTHYROXINE 150 MCG TAB: 150 | 30 days supply | Qty: 30 | Fill #0 | Status: TO

## 2017-05-14 MED FILL — VIT D2 1.25 MG (50,000 UNIT: 1.25 MG | 28 days supply | Qty: 4 | Fill #0

## 2017-05-15 NOTE — Progress Notes (Signed)
Office: [phone]  /  Fax: [phone]   HPI:   Chief Complaint: OBESITY Stacey Roberts is here to discuss her progress with her obesity treatment plan. She is on the Category 2 plan and is following her eating plan approximately 80 % of the time. She states she is walking for 20 minutes 5 times per week. Stacey Roberts returned from [place] a few days ago and was there for most of the two weeks secondary to her sister having a myocardial infarction. Stacey Roberts did indulge in sweets, potatoes and macaroni and cheese yesterday at [place]. Her weight is 237 lb (107.5 kg) today and has had a weight loss of 5 pounds over a period of 2 weeks since her last visit. She has lost 10 lbs since starting treatment with [place].  Vitamin D deficiency Stacey Roberts has a diagnosis of vitamin D deficiency. She is currently taking vit D and denies nausea, vomiting or muscle weakness.  At risk for osteopenia and osteoporosis Stacey Roberts is at higher risk of osteopenia and osteoporosis due to vitamin D deficiency.   Diabetes II Stacey Roberts has a diagnosis of diabetes type II. Stacey Roberts states fasting BGs range between 150's and 270 and she denies any hypoglycemic episodes. She has been working on intensive lifestyle modifications including diet, exercise, and weight loss to help control her blood glucose levels.  Hypertension Stacey Roberts is a 52 y.o. female with hypertension. She is currently taking losartan. Stacey Roberts denies chest pain , chest pressure or headache. She is working weight loss to help control her blood pressure with the goal of decreasing her risk of heart attack and stroke. Stacey Roberts blood pressure is better controlled today.  ALLERGIES: Allergies  Allergen Reactions  . Penicillins Shortness Of Breath, Rash and Other (See Comments)    Has patient had a PCN reaction causing immediate rash, facial/tongue/throat swelling, SOB or lightheadedness with hypotension: Yes Has patient had a PCN reaction causing severe rash involving mucus  membranes or skin necrosis: Yes Has patient had a PCN reaction that required hospitalization No Has patient had a PCN reaction occurring within the last 10 years: Yes If all of the above answers are "NO", then may proceed with Cephalosporin use.   . Clindamycin/Lincomycin Other (See Comments)    Severe stomach cramps  . Ace Inhibitors Other (See Comments) and Cough    REACTION: cough; denies airway involvement     MEDICATIONS: Current Outpatient Medications on File Prior to Visit  Medication Sig Dispense Refill  . aspirin 81 MG chewable tablet Chew 81 mg by mouth daily.    . Cholecalciferol (EQL VITAMIN D3) 2000 units CAPS Take 2,000 Units daily by mouth.    . Dulaglutide (TRULICITY) 1.5 FK/8.1EX SOPN Inject 1.5 mg into the skin once a week. 4 pen 11  . glucose blood (ACCU-CHEK GUIDE) test strip 1 each by Other route 2 (two) times daily. Used to check blood sugars 1x daily. 180 each 3  . Insulin Glargine (LANTUS SOLOSTAR) 100 UNIT/ML Solostar Pen Inject 70 Units into the skin every morning. And pen needles 1/day 30 pen 11  . levothyroxine (SYNTHROID, LEVOTHROID) 150 MCG tablet TAKE ONE TABLET BY MOUTH DAILY BEFORE BREAKFAST. 30 tablet 1  . losartan (COZAAR) 50 MG tablet Take 1 tablet (50 mg total) by mouth daily. 30 tablet 1  . metFORMIN (GLUCOPHAGE) 1000 MG tablet Take 1 tablet (1,000 mg total) by mouth 2 (two) times daily with a meal. 180 tablet 3  . olmesartan (BENICAR) 20 MG tablet TAKE 1  TABLET BY MOUTH DAILY 30 tablet 4  . pantoprazole (PROTONIX) 40 MG tablet TAKE 1 TABLET (40 MG TOTAL) BY MOUTH DAILY. 90 tablet 2  . pregabalin (LYRICA) 75 MG capsule Take 1 capsule (75 mg total) by mouth 2 (two) times daily. 60 capsule 11  . rosuvastatin (CRESTOR) 10 MG tablet Take 1 tablet (10 mg total) by mouth daily. 90 tablet 3  . tamoxifen (NOLVADEX) 20 MG tablet Take 20 mg daily by mouth.   10  . [DISCONTINUED] Calcium Carbonate 1500 MG TABS Take 1,500 mg by mouth daily.       No current  facility-administered medications on file prior to visit.     PAST MEDICAL HISTORY: Past Medical History:  Diagnosis Date  . Allergy    seasonal  . Breast cancer ([place]) 2014  . Breast cancer ([place]) 2017  . Bronchitis    hx of  . Diabetes mellitus, type II ([place])   . GERD (gastroesophageal reflux disease)   . Headache   . History of radiation therapy 12/01/15-01/11/16   Left breast DIBH / 50.4 Gy in 28 fractions and Left breast boost / 12 Gy in 6 fractions  . Hyperlipidemia   . Hypertension   . Hypothyroidism   . Joint pain   . Obesity   . PCOS (polycystic ovarian syndrome)   . Pneumonia    as a child  . Sleep apnea 2008   severe OSA-does not use a cpap  . Snoring   . 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 Buddy Duty    PAST SURGICAL HISTORY: Past Surgical History:  Procedure Laterality Date  . BREAST BIOPSY  2000   s/p  . BREAST BIOPSY Left 01/22/2013   Procedure: RE-EXCISION LEFT BREAST DCIS;  Surgeon: Harl Bowie, MD;  Location: [facility];  Service: General;  Laterality: Left;  . BREAST LUMPECTOMY Left 01/06/2013   Procedure: LUMPECTOMY;  Surgeon: Harl Bowie, MD;  Location: [facility];  Service: General;  Laterality: Left;  . BREAST LUMPECTOMY Left 2017  . BREAST LUMPECTOMY WITH NEEDLE LOCALIZATION AND AXILLARY SENTINEL LYMPH NODE BX Left 09/30/2015   Procedure: Left BREAST LUMPECTOMY WITH NEEDLE LOCALIZATION AND AXILLARY SENTINEL LYMPH NODE BX;  Surgeon: Coralie Keens, MD;  Location: [facility];  Service: General;  Laterality: Left;  . BREAST RECONSTRUCTION Bilateral 10/12/2015   Procedure: BREAST oncoplastic RECONSTRUCTION;  Surgeon: Irene Limbo, MD;  Location: [facility];  Service: Plastics;  Laterality: Bilateral;  . BREAST REDUCTION SURGERY Bilateral 10/12/2015   Procedure: MAMMARY REDUCTION  (BREAST);  Surgeon: Irene Limbo, MD;  Location: [facility];  Service: Plastics;  Laterality: Bilateral;  . DILATION AND CURETTAGE OF UTERUS  2004   s/p  . EXCISION OF BREAST LESION Left 10/12/2015   Procedure: Re-EXCISION OF BREAST;  Surgeon: Coralie Keens, MD;  Location: [facility];  Service: General;  Laterality: Left;  . REDUCTION MAMMAPLASTY Bilateral 09/2015  . THYROIDECTOMY  02/2009   Dr Harlow Asa  . TONSILLECTOMY  1982    SOCIAL HISTORY: Social History   Tobacco Use  . Smoking status: Former Smoker    Packs/day: 0.50    Types: Cigarettes    Last attempt to quit: 09/29/2007    Years since quitting: 9.6  . Smokeless tobacco: Never Used  . Tobacco comment: Smoked on and off for 20 years. Quit about 8 years ago (as of 08/2015)  Substance Use Topics  . Alcohol use: Yes    Alcohol/week: 0.0 oz    Comment: occasionally  . Drug use: No    FAMILY HISTORY: Family History  Problem Relation Age of Onset  . Dementia Father        deceased age 68 secondary to dementia  . Bipolar disorder Father   . Emphysema Father   . Heart disease Father   . COPD Father        smoker  . Hypertension Father   . Depression Father   . Alcoholism Father   . CAD Mother 91       Died age 38 of CAD  . Heart disease Mother   . Graves' disease Mother   . Hypertension Mother   . Thyroid disease Mother   . CAD Maternal Grandfather 60  . Heart disease Maternal Grandfather   . Other Sister 49       hysterectomy for fibroids  . Prostate cancer Brother        low grade; w/ surveillance  . Thyroid nodules Brother 108  . Fibroids Other        niece dx approx 75  . Hypertension Other   . Kidney cancer Cousin        maternal 1st cousin dx 62-47; former smoker  . Lung cancer Other        maternal great aunt (MGM's sister); not a smoker  . Cancer Other        nephew dx neuroblastoma at 66.13 years old  . Colon cancer Neg Hx   . Colon polyps Neg Hx     ROS: Review of Systems    Constitutional: Positive for weight loss.  Cardiovascular: Negative for chest pain.       Negative for chest pressure  Gastrointestinal: Negative for nausea and vomiting.  Musculoskeletal:       Negative for muscle weakness  Neurological: Negative for headaches.  Endo/Heme/Allergies:       Negative for hypoglycemia    PHYSICAL EXAM: Blood pressure 128/83, pulse 92, temperature 97.9 F (36.6 C), temperature source Oral, height 5\' 4"  (1.626 m), weight 237 lb (107.5 kg), SpO2 98 %. Body mass index is 40.68 kg/m. Physical Exam  Constitutional: She is oriented to person, place, and time. She appears well-developed and well-nourished.  Cardiovascular: Normal rate.  Pulmonary/Chest: Effort normal.  Musculoskeletal: Normal range of motion.  Neurological: She is oriented to person, place, and time.  Skin: Skin is warm and dry.  Psychiatric: She has a normal mood and affect. Her behavior is normal.  Vitals reviewed.   RECENT LABS AND TESTS: BMET    Component Value Date/Time   NA 138 03/21/2017 1018   NA 137 02/18/2013 1554   K 4.9 03/21/2017 1018   K 4.4 02/18/2013 1554   CL 99 03/21/2017 1018   CO2 23 03/21/2017 1018   CO2 25 02/18/2013 1554   GLUCOSE 236 (H) 03/21/2017 1018   GLUCOSE 252 (H) 09/28/2016 1451   GLUCOSE 300 (H) 02/18/2013 1554   BUN 16 03/21/2017 1018   BUN 14.2 02/18/2013 1554   CREATININE 0.68 03/21/2017 1018   CREATININE 0.8 02/18/2013 1554   CALCIUM 9.0 03/21/2017 1018   CALCIUM 9.2 02/18/2013 1554   GFRNONAA 101 03/21/2017 1018   GFRAA 116 03/21/2017 1018   Lab Results  Component Value Date   HGBA1C 8.0 04/24/2017   HGBA1C 9.3 (H) 03/21/2017   HGBA1C 10.4 01/10/2017   HGBA1C 9.2 (H) 09/28/2016  HGBA1C 8.1 06/14/2016   Lab Results  Component Value Date   INSULIN 46.3 (H) 03/21/2017   CBC    Component Value Date/Time   WBC 7.7 03/21/2017 1018   WBC 6.2 09/28/2016 1451   RBC 4.91 03/21/2017 1018   RBC 4.99 09/28/2016 1451   HGB 13.5  03/21/2017 1018   HGB 12.5 02/18/2013 1554   HCT 42.0 03/21/2017 1018   HCT 39.6 02/18/2013 1554   PLT 267.0 09/28/2016 1451   PLT 306 02/18/2013 1554   MCV 86 03/21/2017 1018   MCV 82.6 02/18/2013 1554   MCH 27.5 03/21/2017 1018   MCH 27.3 09/30/2015 0942   MCHC 32.1 03/21/2017 1018   MCHC 32.2 09/28/2016 1451   RDW 14.6 03/21/2017 1018   RDW 16.4 (H) 02/18/2013 1554   LYMPHSABS 1.5 03/21/2017 1018   LYMPHSABS 2.0 02/18/2013 1554   MONOABS 0.4 09/28/2016 1451   MONOABS 0.6 02/18/2013 1554   EOSABS 0.1 03/21/2017 1018   BASOSABS 0.0 03/21/2017 1018   BASOSABS 0.0 02/18/2013 1554   Iron/TIBC/Ferritin/ %Sat    Component Value Date/Time   IRON 43 09/28/2016 1451   IRONPCTSAT 12.4 (L) 09/28/2016 1451   Lipid Panel     Component Value Date/Time   CHOL 122 03/21/2017 1018   TRIG 90 03/21/2017 1018   HDL 41 03/21/2017 1018   CHOLHDL 4 09/28/2016 1451   VLDL 32.2 09/28/2016 1451   LDLCALC 63 03/21/2017 1018   LDLDIRECT 153.0 09/01/2014 1634   Hepatic Function Panel     Component Value Date/Time   PROT 6.5 03/21/2017 1018   PROT 7.2 02/18/2013 1554   ALBUMIN 3.7 03/21/2017 1018   ALBUMIN 3.6 02/18/2013 1554   AST 9 03/21/2017 1018   AST 10 02/18/2013 1554   ALT 14 03/21/2017 1018   ALT 16 02/18/2013 1554   ALKPHOS 79 03/21/2017 1018   ALKPHOS 85 02/18/2013 1554   BILITOT 0.3 03/21/2017 1018   BILITOT 0.33 02/18/2013 1554   BILIDIR 0.0 11/15/2011 0946      Component Value Date/Time   TSH 0.33 (L) 04/24/2017 1501   TSH 0.755 03/21/2017 1018   TSH 0.42 09/28/2016 1451   Results for ERIKKA, FOLLMER (MRN [id number]) as of 05/15/2017 13:32  Ref. Range 03/21/2017 10:18  Vitamin D, 25-Hydroxy Latest Ref Range: 30.0 - 100.0 ng/mL 35.9   ASSESSMENT AND PLAN: Type 2 diabetes mellitus without complication, with long-term current use of insulin (HCC)  Vitamin D deficiency - Plan: Vitamin D, Ergocalciferol, (DRISDOL) 50000 units CAPS capsule  Essential hypertension  At  risk for osteoporosis  Class 3 severe obesity with serious comorbidity and body mass index (BMI) of 40.0 to 44.9 in adult, unspecified obesity type ([place])  PLAN:  Vitamin D Deficiency Stacey Roberts was informed that low vitamin D levels contributes to fatigue and are associated with obesity, breast, and colon cancer. She agrees to continue to take prescription Vit D @50 ,000 IU every week #4 with no refills and will follow up for routine testing of vitamin D, at least 2-3 times per year. She was informed of the risk of over-replacement of vitamin D and agrees to not increase her dose unless she discusses this with [place] first. Stacey Roberts agrees to follow up with our clinic in 12 weeks.  At risk for osteopenia and osteoporosis Stacey Roberts is at risk for osteopenia and osteoporosis due to her vitamin D deficiency. She was encouraged to take her vitamin D and follow her higher calcium diet and increase strengthening exercise to  help strengthen her bones and decrease her risk of osteopenia and osteoporosis.  Diabetes II Stacey Roberts has been given extensive diabetes education by myself today including ideal fasting and post-prandial blood glucose readings, individual ideal Hgb A1c goals  and hypoglycemia prevention. We discussed the importance of good blood sugar control to decrease the likelihood of diabetic complications such as nephropathy, neuropathy, limb loss, blindness, coronary artery disease, and death. We discussed the importance of intensive lifestyle modification including diet, exercise and weight loss as the first line treatment for diabetes. Stacey Roberts agrees to continue her diabetes medications and will follow up at the agreed upon time.  Hypertension We discussed sodium restriction, working on healthy weight loss, and a regular exercise program as the means to achieve improved blood pressure control. Stacey Roberts agreed with this plan and agreed to follow up as directed. We will continue to monitor her blood pressure as well as her  progress with the above lifestyle modifications. She will continue her medications as prescribed and will watch for signs of hypotension as she continues her lifestyle modifications.  Obesity Stacey Roberts is currently in the action stage of change. As such, her goal is to continue with weight loss efforts She has agreed to follow the Category 2 plan Stacey Roberts has been instructed to work up to a goal of 150 minutes of combined cardio and strengthening exercise per week for weight loss and overall health benefits. We discussed the following Behavioral Modification Strategies today: no skipping meals, keeping healthy foods in the home, travel cooking strategies, planning for success, increasing lean protein intake and work on meal planning and easy cooking plans  Stacey Roberts has agreed to follow up with our clinic in 2 weeks. She was informed of the importance of frequent follow up visits to maximize her success with intensive lifestyle modifications for her multiple health conditions.   OBESITY BEHAVIORAL INTERVENTION VISIT  Today's visit was # 5 out of 22.  Starting weight: 247 lbs Starting date: 03/21/17 Today's weight : 237 lbs Today's date: 05/14/2017 Total lbs lost to date: 10 (Patients must lose 7 lbs in the first 6 months to continue with counseling)   ASK: We discussed the diagnosis of obesity with Stacey Roberts today and Stacey Roberts agreed to give [place] permission to discuss obesity behavioral modification therapy today.  ASSESS: Stacey Roberts has the diagnosis of obesity and her BMI today is 40.66 Stacey Roberts is in the action stage of change   ADVISE: Stacey Roberts was educated on the multiple health risks of obesity as well as the benefit of weight loss to improve her health. She was advised of the need for long term treatment and the importance of lifestyle modifications.  AGREE: Multiple dietary modification options and treatment options were discussed and  Stacey Roberts agreed to the above obesity treatment plan.  I, Stacey Roberts, am acting as transcriptionist for Eber Jones, MD  I have reviewed the above documentation for accuracy and completeness, and I agree with the above. - Stacey Qua, MD

## 2017-05-17 MED FILL — PANTOPRAZOLE SOD DR 40 MG T: 40 | 90 days supply | Qty: 90 | Fill #2

## 2017-05-22 ENCOUNTER — Telehealth: Payer: Self-pay | Admitting: *Deleted

## 2017-05-22 ENCOUNTER — Other Ambulatory Visit: Payer: Self-pay | Admitting: Podiatry

## 2017-05-22 MED FILL — LYRICA 75 MG CAPSULE: 75 | 30 days supply | Qty: 60 | Fill #0

## 2017-05-22 NOTE — Telephone Encounter (Signed)
Faxed rx for lyrica to [facility].

## 2017-05-30 ENCOUNTER — Ambulatory Visit (INDEPENDENT_AMBULATORY_CARE_PROVIDER_SITE_OTHER): Payer: No Typology Code available for payment source | Admitting: Physician Assistant

## 2017-06-08 ENCOUNTER — Other Ambulatory Visit: Payer: Self-pay | Admitting: Oncology

## 2017-06-11 MED FILL — TRULICITY 1.5 MG/0.5 ML PEN: 1.5 | 28 days supply | Qty: 2 | Fill #0

## 2017-06-11 MED FILL — LEVOTHYROXINE 150 MCG TAB: 150 | 30 days supply | Qty: 30 | Fill #0 | Status: TO

## 2017-06-11 MED FILL — TAMOXIFEN CITRATE 20 MG TAB: 20 | 90 days supply | Qty: 90 | Fill #0 | Status: TO

## 2017-06-11 MED FILL — LANTUS SOLOSTAR 100 UNITS/M: 100 | 34 days supply | Qty: 30 | Fill #0 | Status: TO

## 2017-06-13 ENCOUNTER — Ambulatory Visit (INDEPENDENT_AMBULATORY_CARE_PROVIDER_SITE_OTHER): Payer: No Typology Code available for payment source | Admitting: Family Medicine

## 2017-06-13 VITALS — BP 144/78 | HR 97 | Temp 98.1°F | Ht 64.0 in | Wt 235.0 lb

## 2017-06-13 DIAGNOSIS — Z6841 Body Mass Index (BMI) 40.0 and over, adult: Secondary | ICD-10-CM

## 2017-06-13 DIAGNOSIS — Z9189 Other specified personal risk factors, not elsewhere classified: Secondary | ICD-10-CM

## 2017-06-13 DIAGNOSIS — E119 Type 2 diabetes mellitus without complications: Secondary | ICD-10-CM | POA: Diagnosis not present

## 2017-06-13 DIAGNOSIS — I10 Essential (primary) hypertension: Secondary | ICD-10-CM | POA: Diagnosis not present

## 2017-06-13 DIAGNOSIS — E559 Vitamin D deficiency, unspecified: Secondary | ICD-10-CM

## 2017-06-13 MED ORDER — VITAMIN D (ERGOCALCIFEROL) 1.25 MG (50000 UNIT) PO CAPS: 50000.0000 [IU] | ORAL_CAPSULE | ORAL | 0 refills | Status: DC

## 2017-06-14 NOTE — Progress Notes (Signed)
Office: [phone]  /  Fax: [phone]   HPI:   Chief Complaint: OBESITY Stacey Roberts is here to discuss her progress with her obesity treatment plan. She is on the Category 2 plan and is following her eating plan approximately 50 % of the time. She states she is exercising 0 minutes 0 times per week. Stacey Roberts is still indulging in snacks after dinner, sometimes too excessive.  Her weight is 235 lb (106.6 kg) today and has had a weight loss of 2 pounds over a period of 4 weeks since her last visit. She has lost 12 lbs since starting treatment with [place].  Diabetes II Stacey Roberts has a diagnosis of diabetes type II. Stacey Roberts states blood sugars label but fastings range between 84 and 220, lots of stress eating and overindulging. She denies any hypoglycemic episodes. [name] was 8.0 on 04/24/17. She has been working on intensive lifestyle modifications including diet, exercise, and weight loss to help control her blood glucose levels.  Vitamin D Deficiency Stacey Roberts has a diagnosis of vitamin D deficiency. She is currently taking prescription Vit D. She notes fatigue and denies nausea, vomiting or muscle weakness.  At risk for osteopenia and osteoporosis Stacey Roberts is at higher risk of osteopenia and osteoporosis due to vitamin D deficiency.   Hypertension Stacey Roberts is a 53 y.o. female with hypertension. Stacey Roberts's blood pressure is elevated today. She denies chest pain, chest pressure, or headache. She is working weight loss to help control her blood pressure with the goal of decreasing her risk of heart attack and stroke. Stacey Roberts's blood pressure is not currently controlled.  ALLERGIES: Allergies  Allergen Reactions  . Penicillins Shortness Of Breath, Rash and Other (See Comments)    Has patient had a PCN reaction causing immediate rash, facial/tongue/throat swelling, SOB or lightheadedness with hypotension: Yes Has patient had a PCN reaction causing severe rash involving mucus membranes or skin necrosis: Yes Has  patient had a PCN reaction that required hospitalization No Has patient had a PCN reaction occurring within the last 10 years: Yes If all of the above answers are "NO", then may proceed with Cephalosporin use.   . Clindamycin/Lincomycin Other (See Comments)    Severe stomach cramps  . Ace Inhibitors Other (See Comments) and Cough    REACTION: cough; denies airway involvement     MEDICATIONS: Current Outpatient Medications on File Prior to Visit  Medication Sig Dispense Refill  . aspirin 81 MG chewable tablet Chew 81 mg by mouth daily.    . Cholecalciferol (EQL VITAMIN D3) 2000 units CAPS Take 2,000 Units daily by mouth.    . Dulaglutide (TRULICITY) 1.5 XB/3.5HG SOPN Inject 1.5 mg into the skin once a week. 4 pen 11  . glucose blood (ACCU-CHEK GUIDE) test strip 1 each by Other route 2 (two) times daily. Used to check blood sugars 1x daily. 180 each 3  . Insulin Glargine (LANTUS SOLOSTAR) 100 UNIT/ML Solostar Pen Inject 70 Units into the skin every morning. And pen needles 1/day 30 pen 11  . levothyroxine (SYNTHROID, LEVOTHROID) 150 MCG tablet TAKE ONE TABLET BY MOUTH DAILY BEFORE BREAKFAST. 30 tablet 1  . losartan (COZAAR) 50 MG tablet Take 1 tablet (50 mg total) by mouth daily. 30 tablet 1  . LYRICA 75 MG capsule TAKE 1 CAPSULE BY MOUTH TWICE DAILY 60 capsule 4  . metFORMIN (GLUCOPHAGE) 1000 MG tablet Take 1 tablet (1,000 mg total) by mouth 2 (two) times daily with a meal. 180 tablet 3  . olmesartan (BENICAR)  20 MG tablet TAKE 1 TABLET BY MOUTH DAILY 30 tablet 4  . pantoprazole (PROTONIX) 40 MG tablet TAKE 1 TABLET (40 MG TOTAL) BY MOUTH DAILY. 90 tablet 2  . rosuvastatin (CRESTOR) 10 MG tablet Take 1 tablet (10 mg total) by mouth daily. 90 tablet 3  . tamoxifen (NOLVADEX) 20 MG tablet Take 20 mg daily by mouth.   10  . tamoxifen (NOLVADEX) 20 MG tablet TAKE 1 TABLET BY MOUTH DAILY 90 tablet 10  . [DISCONTINUED] Calcium Carbonate 1500 MG TABS Take 1,500 mg by mouth daily.       No  current facility-administered medications on file prior to visit.     PAST MEDICAL HISTORY: Past Medical History:  Diagnosis Date  . Allergy    seasonal  . Breast cancer ([place]) 2014  . Breast cancer ([place]) 2017  . Bronchitis    hx of  . Diabetes mellitus, type II ([place])   . GERD (gastroesophageal reflux disease)   . Headache   . History of radiation therapy 12/01/15-01/11/16   Left breast DIBH / 50.4 Gy in 28 fractions and Left breast boost / 12 Gy in 6 fractions  . Hyperlipidemia   . Hypertension   . Hypothyroidism   . Joint pain   . Obesity   . PCOS (polycystic ovarian syndrome)   . Pneumonia    as a child  . Sleep apnea 2008   severe OSA-does not use a cpap  . Snoring   . Thyroid cancer (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 Buddy Duty    PAST SURGICAL HISTORY: Past Surgical History:  Procedure Laterality Date  . BREAST BIOPSY  2000   s/p  . BREAST BIOPSY Left 01/22/2013   Procedure: RE-EXCISION LEFT BREAST DCIS;  Surgeon: Harl Bowie, MD;  Location: [place];  Service: General;  Laterality: Left;  . BREAST LUMPECTOMY Left 01/06/2013   Procedure: LUMPECTOMY;  Surgeon: Harl Bowie, MD;  Location: [place];  Service: General;  Laterality: Left;  . BREAST LUMPECTOMY Left 2017  . BREAST LUMPECTOMY WITH NEEDLE LOCALIZATION AND AXILLARY SENTINEL LYMPH NODE BX Left 09/30/2015   Procedure: Left BREAST LUMPECTOMY WITH NEEDLE LOCALIZATION AND AXILLARY SENTINEL LYMPH NODE BX;  Surgeon: Coralie Keens, MD;  Location: [place];  Service: General;  Laterality: Left;  . BREAST RECONSTRUCTION Bilateral 10/12/2015   Procedure: BREAST oncoplastic RECONSTRUCTION;  Surgeon: Irene Limbo, MD;  Location: [place];  Service: Plastics;  Laterality: Bilateral;  . BREAST REDUCTION SURGERY Bilateral 10/12/2015   Procedure: MAMMARY REDUCTION  (BREAST);  Surgeon: Irene Limbo, MD;  Location: [place];  Service: Plastics;  Laterality: Bilateral;  . DILATION AND CURETTAGE OF UTERUS  2004   s/p  . EXCISION OF BREAST LESION Left 10/12/2015   Procedure: Re-EXCISION OF BREAST;  Surgeon: Coralie Keens, MD;  Location: [place];  Service: General;  Laterality: Left;  . REDUCTION MAMMAPLASTY Bilateral 09/2015  . THYROIDECTOMY  02/2009   Dr Harlow Asa  . TONSILLECTOMY  1982    SOCIAL HISTORY: Social History   Tobacco Use  . Smoking status: Former Smoker    Packs/day: 0.50    Types: Cigarettes    Last attempt to quit: 09/29/2007    Years since quitting: 9.7  . Smokeless tobacco: Never Used  . Tobacco comment: Smoked on and off for 20 years. Quit about 8 years ago (as of 08/2015)  Substance Use Topics  . Alcohol use: Yes    Alcohol/week: 0.0 oz    Comment: occasionally  . Drug use: No    FAMILY HISTORY: Family History  Problem Relation Age of Onset  . Dementia Father        deceased age 63 secondary to dementia  . Bipolar disorder Father   . Emphysema Father   . Heart disease Father   . COPD Father        smoker  . Hypertension Father   . Depression Father   . Alcoholism Father   . CAD Mother 22       Died age 40 of CAD  . Heart disease Mother   . Graves' disease Mother   . Hypertension Mother   . Thyroid disease Mother   . CAD Maternal Grandfather 60  . Heart disease Maternal Grandfather   . Other Sister 102       hysterectomy for fibroids  . Prostate cancer Brother        low grade; w/ surveillance  . Thyroid nodules Brother 23  . Fibroids Other        niece dx approx 55  . Hypertension Other   . Kidney cancer Cousin        maternal 1st cousin dx 54-47; former smoker  . Lung cancer Other        maternal great aunt (MGM's sister); not a smoker  . Cancer Other        nephew dx neuroblastoma at 68.82 years old  . Colon cancer Neg Hx   . Colon polyps Neg Hx     ROS: Review of Systems    Constitutional: Positive for malaise/fatigue and weight loss.  Cardiovascular: Negative for chest pain.       Negative chest pressure  Gastrointestinal: Negative for nausea and vomiting.  Musculoskeletal:       Negative muscle weakness  Neurological: Negative for headaches.  Endo/Heme/Allergies:       Negative hypoglycemia    PHYSICAL EXAM: Blood pressure (!) 144/78, pulse 97, temperature 98.1 F (36.7 C), temperature source Oral, height 5\' 4"  (1.626 m), weight 235 lb (106.6 kg), SpO2 98 %. Body mass index is 40.34 kg/m. Physical Exam  Constitutional: She is oriented to person, place, and time. She appears well-developed and well-nourished.  Cardiovascular: Normal rate.  Pulmonary/Chest: Effort normal.  Musculoskeletal: Normal range of motion.  Neurological: She is oriented to person, place, and time.  Skin: Skin is warm and dry.  Psychiatric: She has a normal mood and affect. Her behavior is normal.  Vitals reviewed.   RECENT LABS AND TESTS: BMET    Component Value Date/Time   NA 138 03/21/2017 1018   NA 137 02/18/2013 1554   K 4.9 03/21/2017 1018   K 4.4 02/18/2013 1554   CL 99 03/21/2017 1018   CO2 23 03/21/2017 1018   CO2 25 02/18/2013 1554   GLUCOSE 236 (H) 03/21/2017 1018   GLUCOSE 252 (H) 09/28/2016 1451   GLUCOSE 300 (H) 02/18/2013 1554   BUN 16 03/21/2017 1018   BUN 14.2 02/18/2013 1554   CREATININE 0.68 03/21/2017 1018   CREATININE 0.8 02/18/2013 1554   CALCIUM 9.0 03/21/2017 1018   CALCIUM 9.2 02/18/2013 1554   GFRNONAA 101 03/21/2017 1018   GFRAA 116 03/21/2017 1018   Lab Results  Component Value Date   HGBA1C 8.0 04/24/2017   HGBA1C 9.3 (H) 03/21/2017   HGBA1C 10.4 01/10/2017   HGBA1C 9.2 (H) 09/28/2016  HGBA1C 8.1 06/14/2016   Lab Results  Component Value Date   INSULIN 46.3 (H) 03/21/2017   CBC    Component Value Date/Time   WBC 7.7 03/21/2017 1018   WBC 6.2 09/28/2016 1451   RBC 4.91 03/21/2017 1018   RBC 4.99 09/28/2016 1451    HGB 13.5 03/21/2017 1018   HGB 12.5 02/18/2013 1554   HCT 42.0 03/21/2017 1018   HCT 39.6 02/18/2013 1554   PLT 267.0 09/28/2016 1451   PLT 306 02/18/2013 1554   MCV 86 03/21/2017 1018   MCV 82.6 02/18/2013 1554   MCH 27.5 03/21/2017 1018   MCH 27.3 09/30/2015 0942   MCHC 32.1 03/21/2017 1018   MCHC 32.2 09/28/2016 1451   RDW 14.6 03/21/2017 1018   RDW 16.4 (H) 02/18/2013 1554   LYMPHSABS 1.5 03/21/2017 1018   LYMPHSABS 2.0 02/18/2013 1554   MONOABS 0.4 09/28/2016 1451   MONOABS 0.6 02/18/2013 1554   EOSABS 0.1 03/21/2017 1018   BASOSABS 0.0 03/21/2017 1018   BASOSABS 0.0 02/18/2013 1554   Iron/TIBC/Ferritin/ %Sat    Component Value Date/Time   IRON 43 09/28/2016 1451   IRONPCTSAT 12.4 (L) 09/28/2016 1451   Lipid Panel     Component Value Date/Time   CHOL 122 03/21/2017 1018   TRIG 90 03/21/2017 1018   HDL 41 03/21/2017 1018   CHOLHDL 4 09/28/2016 1451   VLDL 32.2 09/28/2016 1451   LDLCALC 63 03/21/2017 1018   LDLDIRECT 153.0 09/01/2014 1634   Hepatic Function Panel     Component Value Date/Time   PROT 6.5 03/21/2017 1018   PROT 7.2 02/18/2013 1554   ALBUMIN 3.7 03/21/2017 1018   ALBUMIN 3.6 02/18/2013 1554   AST 9 03/21/2017 1018   AST 10 02/18/2013 1554   ALT 14 03/21/2017 1018   ALT 16 02/18/2013 1554   ALKPHOS 79 03/21/2017 1018   ALKPHOS 85 02/18/2013 1554   BILITOT 0.3 03/21/2017 1018   BILITOT 0.33 02/18/2013 1554   BILIDIR 0.0 11/15/2011 0946      Component Value Date/Time   TSH 0.33 (L) 04/24/2017 1501   TSH 0.755 03/21/2017 1018   TSH 0.42 09/28/2016 1451  Results for CARINA, CHAPLIN (MRN [id number]) as of 06/14/2017 08:48  Ref. Range 03/21/2017 10:18  Vitamin D, 25-Hydroxy Latest Ref Range: 30.0 - 100.0 ng/mL 35.9    ASSESSMENT AND PLAN: Type 2 diabetes mellitus without complication, without long-term current use of insulin (HCC)  Vitamin D deficiency - Plan: Vitamin D, Ergocalciferol, (DRISDOL) 50000 units CAPS capsule  Essential  hypertension  At risk for osteoporosis  Class 3 severe obesity with serious comorbidity and body mass index (BMI) of 40.0 to 44.9 in adult, unspecified obesity type ([place])  PLAN:  Diabetes II Oprah has been given extensive diabetes education by myself today including ideal fasting and post-prandial blood glucose readings, individual ideal HgA1c goals  and hypoglycemia prevention. We discussed the importance of good blood sugar control to decrease the likelihood of diabetic complications such as nephropathy, neuropathy, limb loss, blindness, coronary artery disease, and death. We discussed the importance of intensive lifestyle modification including diet, exercise and weight loss as the first line treatment for diabetes. Clois agrees to continue metformin, Lantus, and Trulicity, and she agrees to follow up with our clinic in 2 weeks.  Vitamin D Deficiency Loys was informed that low vitamin D levels contributes to fatigue and are associated with obesity, breast, and colon cancer. Kimerly agrees to continue taking prescription Vit D @50 ,000 IU every  week #4 and we will refill for 1 month. She will follow up for routine testing of vitamin D, at least 2-3 times per year. She was informed of the risk of over-replacement of vitamin D and agrees to not increase her dose unless she discusses this with [place] first. Kathrina agrees to follow up with our clinic in 2 weeks.  At risk for osteopenia and osteoporosis Rebbecca is at risk for osteopenia and osteoporsis due to her vitamin D deficiency. She was encouraged to take her vitamin D and follow her higher calcium diet and increase strengthening exercise to help strengthen her bones and decrease her risk of osteopenia and osteoporosis.  Hypertension We discussed sodium restriction, working on healthy weight loss, and a regular exercise program as the means to achieve improved blood pressure control. Karn agreed with this plan and agreed to follow up as directed. We will  continue to monitor her blood pressure as well as her progress with the above lifestyle modifications. She will continue her current medications and will watch for signs of hypotension as she continues her lifestyle modifications. Marilea agrees to follow up with our clinic in 2 weeks.  Obesity Thyra is currently in the action stage of change. As such, her goal is to continue with weight loss efforts She has agreed to follow the Category 2 plan Xiana has been instructed to work up to a goal of 150 minutes of combined cardio and strengthening exercise per week for weight loss and overall health benefits. We discussed the following Behavioral Modification Strategies today: increasing lean protein intake, increasing vegetables, work on meal planning and easy cooking plans, better snacking choices, and planning for success   Amberlie has agreed to follow up with our clinic in 2 weeks. She was informed of the importance of frequent follow up visits to maximize her success with intensive lifestyle modifications for her multiple health conditions.   OBESITY BEHAVIORAL INTERVENTION VISIT  Today's visit was # 6 out of 22.  Starting weight: 247 lbs Starting date: 03/21/17 Today's weight : 235 lbs Today's date: 06/13/2017 Total lbs lost to date: 12 (Patients must lose 7 lbs in the first 6 months to continue with counseling)   ASK: We discussed the diagnosis of obesity with Roosvelt Maser today and Billiejean agreed to give [place] permission to discuss obesity behavioral modification therapy today.  ASSESS: Neeley has the diagnosis of obesity and her BMI today is 40.32 Lacretia is in the action stage of change   ADVISE: Noah was educated on the multiple health risks of obesity as well as the benefit of weight loss to improve her health. She was advised of the need for long term treatment and the importance of lifestyle modifications.  AGREE: Multiple dietary modification options and treatment options were discussed  and  Valeen agreed to the above obesity treatment plan.  I, Trixie Dredge, am acting as transcriptionist for Ilene Qua, MD  I have reviewed the above documentation for accuracy and completeness, and I agree with the above. - Ilene Qua, MD

## 2017-06-16 ENCOUNTER — Encounter (HOSPITAL_COMMUNITY): Payer: Self-pay | Admitting: Oncology

## 2017-06-19 ENCOUNTER — Ambulatory Visit
Admission: RE | Admit: 2017-06-19 | Discharge: 2017-06-19 | Disposition: A | Discharge status: Home / Self Care | Payer: No Typology Code available for payment source | Source: Ambulatory Visit | Attending: Oncology | Admitting: Oncology

## 2017-06-19 DIAGNOSIS — Z9889 Other specified postprocedural states: Secondary | ICD-10-CM

## 2017-06-19 HISTORY — DX: Personal history of irradiation: Z92.3

## 2017-06-19 MED FILL — VIT D2 1.25 MG (50,000 UNIT: 1.25 MG | 28 days supply | Qty: 4 | Fill #0

## 2017-06-26 ENCOUNTER — Encounter: Payer: Self-pay | Admitting: Internal Medicine

## 2017-06-26 DIAGNOSIS — R635 Abnormal weight gain: Secondary | ICD-10-CM

## 2017-07-03 ENCOUNTER — Other Ambulatory Visit: Payer: Self-pay | Admitting: Internal Medicine

## 2017-07-03 MED FILL — LOSARTAN POTASSIUM 50 MG TA: 50 | 30 days supply | Qty: 30 | Fill #0 | Status: TO

## 2017-07-03 MED FILL — TRULICITY 1.5 MG/0.5 ML PEN: 1.5 | 28 days supply | Qty: 2 | Fill #1

## 2017-07-05 ENCOUNTER — Encounter: Payer: Self-pay | Admitting: Internal Medicine

## 2017-07-05 ENCOUNTER — Ambulatory Visit (INDEPENDENT_AMBULATORY_CARE_PROVIDER_SITE_OTHER): Payer: No Typology Code available for payment source | Admitting: Physician Assistant

## 2017-07-05 VITALS — BP 117/75 | HR 100 | Temp 98.4°F | Ht 64.0 in | Wt 232.0 lb

## 2017-07-05 DIAGNOSIS — Z853 Personal history of malignant neoplasm of breast: Secondary | ICD-10-CM

## 2017-07-05 DIAGNOSIS — E119 Type 2 diabetes mellitus without complications: Secondary | ICD-10-CM

## 2017-07-05 DIAGNOSIS — E1142 Type 2 diabetes mellitus with diabetic polyneuropathy: Secondary | ICD-10-CM

## 2017-07-05 DIAGNOSIS — Z794 Long term (current) use of insulin: Secondary | ICD-10-CM | POA: Diagnosis not present

## 2017-07-05 DIAGNOSIS — Z6839 Body mass index (BMI) 39.0-39.9, adult: Secondary | ICD-10-CM | POA: Diagnosis not present

## 2017-07-09 NOTE — Progress Notes (Addendum)
Office: [phone]  /  Fax: [phone]   HPI:   Chief Complaint: OBESITY Stacey Roberts is here to discuss her progress with her obesity treatment plan. She is on the Category 2 plan and is following her eating plan approximately 80 % of the time. She states she is walking for 20 minutes 7 times per week. [name] continues to do well with weight loss. She continues to have challenges getting the recommended protein on the meal plan.  Her weight is 232 lb (105.2 kg) today and has had a weight loss of 3 pounds over a period of 3 weeks since her last visit. She has lost 15 lbs since starting treatment with Stacey Roberts.  Diabetes II Stacey Roberts has a diagnosis of diabetes type II. Stacey Roberts states fasting BGs range between 130's and 210's and post prandial range in 260's. She is on Lantus 70 units in the morning. She denies any hypoglycemic episodes. [name] was 8.0 on 04/24/17. She has been working on intensive lifestyle modifications including diet, exercise, and weight loss to help control her blood glucose levels.  ALLERGIES: Allergies  Allergen Reactions  . Penicillins Shortness Of Breath, Rash and Other (See Comments)    Has patient had a PCN reaction causing immediate rash, facial/tongue/throat swelling, SOB or lightheadedness with hypotension: Yes Has patient had a PCN reaction causing severe rash involving mucus membranes or skin necrosis: Yes Has patient had a PCN reaction that required hospitalization No Has patient had a PCN reaction occurring within the last 10 years: Yes If all of the above answers are "NO", then may proceed with Cephalosporin use.   . Clindamycin/Lincomycin Other (See Comments)    Severe stomach cramps  . Ace Inhibitors Other (See Comments) and Cough    REACTION: cough; denies airway involvement     MEDICATIONS: Current Outpatient Medications on File Prior to Visit  Medication Sig Dispense Refill  . aspirin 81 MG chewable tablet Chew 81 mg by mouth daily.    . Cholecalciferol (EQL  VITAMIN D3) 2000 units CAPS Take 2,000 Units daily by mouth.    . Dulaglutide (TRULICITY) 1.5 LE/7.5TZ SOPN Inject 1.5 mg into the skin once a week. 4 pen 11  . glucose blood (ACCU-CHEK GUIDE) test strip 1 each by Other route 2 (two) times daily. Used to check blood sugars 1x daily. 180 each 3  . Insulin Glargine (LANTUS SOLOSTAR) 100 UNIT/ML Solostar Pen Inject 70 Units into the skin every morning. And pen needles 1/day 30 pen 11  . levothyroxine (SYNTHROID, LEVOTHROID) 150 MCG tablet TAKE ONE TABLET BY MOUTH DAILY BEFORE BREAKFAST. 30 tablet 1  . losartan (COZAAR) 50 MG tablet TAKE 1 TABLET (50 MG TOTAL) BY MOUTH DAILY. 30 tablet 1  . LYRICA 75 MG capsule TAKE 1 CAPSULE BY MOUTH TWICE DAILY 60 capsule 4  . metFORMIN (GLUCOPHAGE) 1000 MG tablet Take 1 tablet (1,000 mg total) by mouth 2 (two) times daily with a meal. 180 tablet 3  . pantoprazole (PROTONIX) 40 MG tablet TAKE 1 TABLET (40 MG TOTAL) BY MOUTH DAILY. 90 tablet 2  . rosuvastatin (CRESTOR) 10 MG tablet Take 1 tablet (10 mg total) by mouth daily. 90 tablet 3  . tamoxifen (NOLVADEX) 20 MG tablet Take 20 mg daily by mouth.   10  . Vitamin D, Ergocalciferol, (DRISDOL) 50000 units CAPS capsule Take 1 capsule (50,000 Units total) by mouth every 7 (seven) days. 4 capsule 0  . [DISCONTINUED] Calcium Carbonate 1500 MG TABS Take 1,500 mg by mouth daily.  No current facility-administered medications on file prior to visit.     PAST MEDICAL HISTORY: Past Medical History:  Diagnosis Date  . Allergy    seasonal  . Breast cancer ([place]) 2014  . Breast cancer ([place]) 2017  . Bronchitis    hx of  . Diabetes mellitus, type II ([place])   . GERD (gastroesophageal reflux disease)   . Headache   . History of radiation therapy 12/01/15-01/11/16   Left breast DIBH / 50.4 Gy in 28 fractions and Left breast boost / 12 Gy in 6 fractions  . Hyperlipidemia   . Hypertension   . Hypothyroidism   . Joint pain   . Obesity   . PCOS (polycystic ovarian  syndrome)   . Personal history of radiation therapy 2017  . Pneumonia    as a child  . Sleep apnea 2008   severe OSA-does not use a cpap  . Snoring   . 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 Buddy Duty    PAST SURGICAL HISTORY: Past Surgical History:  Procedure Laterality Date  . BREAST BIOPSY  2000   s/p  . BREAST BIOPSY Left 01/22/2013   Procedure: RE-EXCISION LEFT BREAST DCIS;  Surgeon: Harl Bowie, MD;  Location: [facility];  Service: General;  Laterality: Left;  . BREAST LUMPECTOMY Left 01/06/2013   Procedure: LUMPECTOMY;  Surgeon: Harl Bowie, MD;  Location: [facility];  Service: General;  Laterality: Left;  . BREAST LUMPECTOMY Left 2017  . BREAST LUMPECTOMY WITH NEEDLE LOCALIZATION AND AXILLARY SENTINEL LYMPH NODE BX Left 09/30/2015   Procedure: Left BREAST LUMPECTOMY WITH NEEDLE LOCALIZATION AND AXILLARY SENTINEL LYMPH NODE BX;  Surgeon: Coralie Keens, MD;  Location: [facility];  Service: General;  Laterality: Left;  . BREAST RECONSTRUCTION Bilateral 10/12/2015   Procedure: BREAST oncoplastic RECONSTRUCTION;  Surgeon: Irene Limbo, MD;  Location: [facility];  Service: Plastics;  Laterality: Bilateral;  . BREAST REDUCTION SURGERY Bilateral 10/12/2015   Procedure: MAMMARY REDUCTION  (BREAST);  Surgeon: Irene Limbo, MD;  Location: [facility];  Service: Plastics;  Laterality: Bilateral;  . DILATION AND CURETTAGE OF UTERUS  2004   s/p  . EXCISION OF BREAST LESION Left 10/12/2015   Procedure: Re-EXCISION OF BREAST;  Surgeon: Coralie Keens, MD;  Location: [facility];  Service: General;  Laterality: Left;  . REDUCTION MAMMAPLASTY Bilateral 09/2015  . THYROIDECTOMY  02/2009   Dr Harlow Asa  . TONSILLECTOMY  1982    SOCIAL HISTORY: Social History   Tobacco Use  . Smoking status: Former Smoker    Packs/day:  0.50    Types: Cigarettes    Last attempt to quit: 09/29/2007    Years since quitting: 9.7  . Smokeless tobacco: Never Used  . Tobacco comment: Smoked on and off for 20 years. Quit about 8 years ago (as of 08/2015)  Substance Use Topics  . Alcohol use: Yes    Alcohol/week: 0.0 oz    Comment: occasionally  . Drug use: No    FAMILY HISTORY: Family History  Problem Relation Age of Onset  . Dementia Father        deceased age 28 secondary to dementia  . Bipolar disorder Father   . Emphysema Father   . Heart disease Father   . COPD Father        smoker  . Hypertension Father   . Depression Father   .  Alcoholism Father   . CAD Mother 22       Died age 13 of CAD  . Heart disease Mother   . Graves' disease Mother   . Hypertension Mother   . Thyroid disease Mother   . CAD Maternal Grandfather 60  . Heart disease Maternal Grandfather   . Other Sister 79       hysterectomy for fibroids  . Prostate cancer Brother        low grade; w/ surveillance  . Thyroid nodules Brother 7  . Fibroids Other        niece dx approx 39  . Hypertension Other   . Kidney cancer Cousin        maternal 1st cousin dx 52-47; former smoker  . Lung cancer Other        maternal great aunt (MGM's sister); not a smoker  . Cancer Other        nephew dx neuroblastoma at 44.84 years old  . Colon cancer Neg Hx   . Colon polyps Neg Hx     ROS: Review of Systems  Constitutional: Positive for weight loss.  Endo/Heme/Allergies:       Negative hypoglycemia    PHYSICAL EXAM: Blood pressure 117/75, pulse 100, temperature 98.4 F (36.9 C), temperature source Oral, height 5\' 4"  (1.626 m), weight 232 lb (105.2 kg), SpO2 97 %. Body mass index is 39.82 kg/m. Physical Exam  Constitutional: She is oriented to person, place, and time. She appears well-developed and well-nourished.  Cardiovascular: Normal rate.  Pulmonary/Chest: Effort normal.  Musculoskeletal: Normal range of motion.  Neurological: She is  oriented to person, place, and time.  Skin: Skin is warm and dry.  Psychiatric: She has a normal mood and affect. Her behavior is normal.  Vitals reviewed.   RECENT LABS AND TESTS: BMET    Component Value Date/Time   NA 138 03/21/2017 1018   NA 137 02/18/2013 1554   K 4.9 03/21/2017 1018   K 4.4 02/18/2013 1554   CL 99 03/21/2017 1018   CO2 23 03/21/2017 1018   CO2 25 02/18/2013 1554   GLUCOSE 236 (H) 03/21/2017 1018   GLUCOSE 252 (H) 09/28/2016 1451   GLUCOSE 300 (H) 02/18/2013 1554   BUN 16 03/21/2017 1018   BUN 14.2 02/18/2013 1554   CREATININE 0.68 03/21/2017 1018   CREATININE 0.8 02/18/2013 1554   CALCIUM 9.0 03/21/2017 1018   CALCIUM 9.2 02/18/2013 1554   GFRNONAA 101 03/21/2017 1018   GFRAA 116 03/21/2017 1018   Lab Results  Component Value Date   HGBA1C 8.0 04/24/2017   HGBA1C 9.3 (H) 03/21/2017   HGBA1C 10.4 01/10/2017   HGBA1C 9.2 (H) 09/28/2016   HGBA1C 8.1 06/14/2016   Lab Results  Component Value Date   INSULIN 46.3 (H) 03/21/2017   CBC    Component Value Date/Time   WBC 7.7 03/21/2017 1018   WBC 6.2 09/28/2016 1451   RBC 4.91 03/21/2017 1018   RBC 4.99 09/28/2016 1451   HGB 13.5 03/21/2017 1018   HGB 12.5 02/18/2013 1554   HCT 42.0 03/21/2017 1018   HCT 39.6 02/18/2013 1554   PLT 267.0 09/28/2016 1451   PLT 306 02/18/2013 1554   MCV 86 03/21/2017 1018   MCV 82.6 02/18/2013 1554   MCH 27.5 03/21/2017 1018   MCH 27.3 09/30/2015 0942   MCHC 32.1 03/21/2017 1018   MCHC 32.2 09/28/2016 1451   RDW 14.6 03/21/2017 1018   RDW 16.4 (H) 02/18/2013 1554  LYMPHSABS 1.5 03/21/2017 1018   LYMPHSABS 2.0 02/18/2013 1554   MONOABS 0.4 09/28/2016 1451   MONOABS 0.6 02/18/2013 1554   EOSABS 0.1 03/21/2017 1018   BASOSABS 0.0 03/21/2017 1018   BASOSABS 0.0 02/18/2013 1554   Iron/TIBC/Ferritin/ %Sat    Component Value Date/Time   IRON 43 09/28/2016 1451   IRONPCTSAT 12.4 (L) 09/28/2016 1451   Lipid Panel     Component Value Date/Time   CHOL 122  03/21/2017 1018   TRIG 90 03/21/2017 1018   HDL 41 03/21/2017 1018   CHOLHDL 4 09/28/2016 1451   VLDL 32.2 09/28/2016 1451   LDLCALC 63 03/21/2017 1018   LDLDIRECT 153.0 09/01/2014 1634   Hepatic Function Panel     Component Value Date/Time   PROT 6.5 03/21/2017 1018   PROT 7.2 02/18/2013 1554   ALBUMIN 3.7 03/21/2017 1018   ALBUMIN 3.6 02/18/2013 1554   AST 9 03/21/2017 1018   AST 10 02/18/2013 1554   ALT 14 03/21/2017 1018   ALT 16 02/18/2013 1554   ALKPHOS 79 03/21/2017 1018   ALKPHOS 85 02/18/2013 1554   BILITOT 0.3 03/21/2017 1018   BILITOT 0.33 02/18/2013 1554   BILIDIR 0.0 11/15/2011 0946      Component Value Date/Time   TSH 0.33 (L) 04/24/2017 1501   TSH 0.755 03/21/2017 1018   TSH 0.42 09/28/2016 1451    ASSESSMENT AND PLAN: Type 2 diabetes mellitus with diabetic polyneuropathy, with long-term current use of insulin ([facility])  Class 2 severe obesity with serious comorbidity and body mass index (BMI) of 39.0 to 39.9 in adult, unspecified obesity type ([place])  PLAN:  Diabetes II Carling has been given extensive diabetes education by myself today including ideal fasting and post-prandial blood glucose readings, individual ideal Hgb A1c goals and hypoglycemia prevention. We discussed the importance of good blood sugar control to decrease the likelihood of diabetic complications such as nephropathy, neuropathy, limb loss, blindness, coronary artery disease, and death. We discussed the importance of intensive lifestyle modification including diet, exercise and weight loss as the first line treatment for diabetes. Marjon agrees to continue her diabetes medications and she agrees to follow up with our clinic in 2 weeks.  We spent > than 50% of the 15 minute visit on the counseling as documented in the note.  Obesity Tamana is currently in the action stage of change. As such, her goal is to continue with weight loss efforts She has agreed to follow the Category 2 plan Shanan has been  instructed to work up to a goal of 150 minutes of combined cardio and strengthening exercise per week for weight loss and overall health benefits. We discussed the following Behavioral Modification Strategies today: increasing lean protein intake and work on meal planning and easy cooking plans   Rayona has agreed to follow up with our clinic in 2 weeks. She was informed of the importance of frequent follow up visits to maximize her success with intensive lifestyle modifications for her multiple health conditions.   OBESITY BEHAVIORAL INTERVENTION VISIT  Today's visit was # 7 out of 22.  Starting weight: 247 lbs Starting date: 03/21/17 Today's weight : 232 lbs  Today's date: 07/05/2017 Total lbs lost to date: 15 (Patients must lose 7 lbs in the first 6 months to continue with counseling)   ASK: We discussed the diagnosis of obesity with Roosvelt Maser today and Terilynn agreed to give Stacey Roberts permission to discuss obesity behavioral modification therapy today.  ASSESS: Ardys has the diagnosis  of obesity and her BMI today is 39.8 Arantza is in the action stage of change   ADVISE: Sherilyn was educated on the multiple health risks of obesity as well as the benefit of weight loss to improve her health. She was advised of the need for long term treatment and the importance of lifestyle modifications.  AGREE: Multiple dietary modification options and treatment options were discussed and  Ebonee agreed to the above obesity treatment plan.   Wilhemena Durie, am acting as transcriptionist for Lacy Duverney, PA-C I, Lacy Duverney [facility], have reviewed this note and agree with its content

## 2017-07-10 ENCOUNTER — Other Ambulatory Visit: Payer: Self-pay | Admitting: Endocrinology

## 2017-07-10 MED FILL — LEVOTHYROXINE 150 MCG TAB: 150 | 30 days supply | Qty: 30 | Fill #0

## 2017-07-13 ENCOUNTER — Encounter (INDEPENDENT_AMBULATORY_CARE_PROVIDER_SITE_OTHER): Payer: Self-pay | Admitting: Physician Assistant

## 2017-07-13 ENCOUNTER — Other Ambulatory Visit: Payer: Self-pay | Admitting: *Deleted

## 2017-07-13 DIAGNOSIS — C50112 Malignant neoplasm of central portion of left female breast: Secondary | ICD-10-CM

## 2017-07-13 DIAGNOSIS — Z170 Estrogen receptor positive status [ER+]: Principal | ICD-10-CM

## 2017-07-15 NOTE — Progress Notes (Signed)
[facility]  Telephone:([phone]) [phone] Fax:(336) [phone]     ID: Stacey Roberts DOB: 04-14-1965  MR#: [id number]  ONG#:[id number]  Patient Care Team: [name], NP as PCP - General (Internal Medicine) Kasia Trego, [name], MD as Consulting Physician (Oncology) [name], MD as Consulting Physician (General Surgery) [name], MD as Consulting Physician (Obstetrics and Gynecology) [name], MD as Consulting Physician (Radiation Oncology) [name], MD as Consulting Physician (Endocrinology) [name], MD as Consulting Physician (Gastroenterology) [name], MD as Consulting Physician (Plastic Surgery) OTHER MD:  CHIEF COMPLAINT: Estrogen receptor positive breast cancer  CURRENT TREATMENT: Tamoxifen   BREAST CANCER HISTORY: From the original intake note:  Stacey Roberts has a 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 She had left lumpectomy for that lesion 01/06/2013, showing a low-grade noninvasive ductal carcinoma which was estrogen receptor 99% and progesterone receptor 100% positive. Margins were focally positive and she had further excision 01/22/2013 which showed residual focal ductal carcinoma in situ 1 mm from the posterior margin. The patient refused adjuvant radiation and did not tolerate tamoxifen, which she took for approximately 2 months. She was last seen in our office in 2015.  Because of that history she receives diagnostic bilateral mammography yearly. Her 02/15/2015 study found the breast density to be category A. This study was negative. However in early August 2017 she developed a new left nipple discharge, which at times was bloody. Repeat left mammography with tomography and left breast ultrasonography 08/30/2015 now read the breast density to be category B. Aside some medial left breast masses and postlumpectomy findings, none of which showed any change since 2012, there were new  pleomorphic calcifications posterior to the lumpectomy area 1.4 cm. On exam there was no palpable lump. Ultrasound showed scarring behind the nipple which was unchanged from prior, but no definite mass.  Biopsy of the left breast upper outer quadrant area of calcifications 09/02/2015 showed ([id number]) ductal carcinoma in situ, grade 1. Focal invasion could not be excluded. There was not enough tissue for a prognostic panel.  On 09/10/2015 the patient underwent bilateral breast MRI with and without contrast. This again found the breast density to be category B. There was an area of non-masslike enhancement involving the upper outer quadrant of the left breast from the nipple posteriorly extending approximately 10 cm. The right breast was unremarkable. There was no evidence of adenopathy. MRI guided biopsy of the upper-outer quadrant retroareolar area of the left breast 09/16/2015 showed fat necrosis but no evidence of malignancy ([id number]).  Stacey Roberts's subsequent history is as detailed below  INTERVAL HISTORY: Yoyo returns today for follow-up and treatment of her estrogen receptor positive breast cancer. She continues on tamoxifen, with good tolerance. She denies issues with increased vaginal discharge.   She has hot flashes during the day and also at night that wake her up. She also has shooting pains in the left breast that wake her up if she sleeps on that side.   She takes Lyrica in the morning for neuropathy,--takes it only once per day, instead of twice because the medication is expensive.  This was prescribed by her podiatrist.  Since her last visit, she underwent diagnostic bilateral mammography with CAD and tomography on 06/19/2017 at [facility] showing: breast density category B. There was no evidence of malignancy.    REVIEW OF SYSTEMS: [name]  reports that she is tired. She has been having insomnia over the last  2 months. She has been seeing a weight management class. She is  following their diet plan, and she lost 16 lbs so far. She denies unusual headaches, visual changes, nausea, vomiting, or dizziness. There has been no unusual cough, phlegm production, or pleurisy. This been no change in bowel or bladder habits. She denies unexplained fatigue or unexplained weight loss, bleeding, rash, or fever. A detailed review of systems was otherwise stable.    PAST MEDICAL HISTORY: Past Medical History:  Diagnosis Date  . Allergy    seasonal  . Breast cancer (HCC) 2014  . Breast cancer (HCC) 2017  . Bronchitis    hx of  . Diabetes mellitus, type II (HCC)   . GERD (gastroesophageal reflux disease)   . Headache   . History of radiation therapy 12/01/15-01/11/16   Left breast DIBH / 50.4 Gy in 28 fractions and Left breast boost / 12 Gy in 6 fractions  . Hyperlipidemia   . Hypertension   . Hypothyroidism   . Joint pain   . Obesity   . PCOS (polycystic ovarian syndrome)   . Personal history of radiation therapy 2017  . Pneumonia    as a child  . Sleep apnea 2008   severe OSA-does not use a cpap  . Snoring   . 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 Sharl Ma    PAST SURGICAL HISTORY: Past Surgical History:  Procedure Laterality Date  . BREAST BIOPSY  2000   s/p  . BREAST BIOPSY Left 01/22/2013   Procedure: RE-EXCISION LEFT BREAST DCIS;  Surgeon: Shelly Rubenstein, MD;  Location: [facility];  Service: General;  Laterality: Left;  . BREAST LUMPECTOMY Left 01/06/2013   Procedure: LUMPECTOMY;  Surgeon: Shelly Rubenstein, MD;  Location: [facility] OR;  Service: General;  Laterality: Left;  . BREAST LUMPECTOMY Left 2017  . BREAST LUMPECTOMY WITH NEEDLE LOCALIZATION AND AXILLARY SENTINEL LYMPH NODE BX Left 09/30/2015   Procedure: Left BREAST LUMPECTOMY WITH NEEDLE LOCALIZATION AND AXILLARY SENTINEL LYMPH NODE BX;  Surgeon: [name], MD;  Location:  [facility] OR;  Service: General;  Laterality: Left;  . BREAST RECONSTRUCTION Bilateral 10/12/2015   Procedure: BREAST oncoplastic RECONSTRUCTION;  Surgeon: [name], MD;  Location: [facility];  Service: Plastics;  Laterality: Bilateral;  . BREAST REDUCTION SURGERY Bilateral 10/12/2015   Procedure: MAMMARY REDUCTION  (BREAST);  Surgeon: [name], MD;  Location: [facility];  Service: Plastics;  Laterality: Bilateral;  . DILATION AND CURETTAGE OF UTERUS  2004   s/p  . EXCISION OF BREAST LESION Left 10/12/2015   Procedure: Re-EXCISION OF BREAST;  Surgeon: [name], MD;  Location: [facility];  Service: General;  Laterality: Left;  . REDUCTION MAMMAPLASTY Bilateral 09/2015  . THYROIDECTOMY  02/2009   Dr Gerrit Friends  . TONSILLECTOMY  1982    FAMILY HISTORY Family History  Problem Relation Age of Onset  . Dementia Father        deceased age 60 secondary to dementia  . Bipolar disorder Father   . Emphysema Father   . Heart disease Father   . COPD Father        smoker  . Hypertension Father   . Depression Father   . Alcoholism Father   . CAD Mother 8       Died age 28 of CAD  . Heart disease Mother   . Graves' disease  Mother   . Hypertension Mother   . Thyroid disease Mother   . CAD Maternal Grandfather 60  . Heart disease Maternal Grandfather   . Other Sister 61       hysterectomy for fibroids  . Prostate cancer Brother        low grade; w/ surveillance  . Thyroid nodules Brother 59  . Fibroids Other        niece dx approx 36  . Hypertension Other   . Kidney cancer Cousin        maternal 1st cousin dx 32-47; former smoker  . Lung cancer Other        maternal great aunt (MGM's sister); not a smoker  . Cancer Other        nephew dx neuroblastoma at 36.16 years old  . Colon cancer Neg Hx   . Colon polyps Neg Hx   The patient's father died at the age of 51 from myocardial infarction in the setting of dementia. The patient's  mother died at the age of 22 from heart disease. Makynna has 2 brothers, 2 sisters. There is no history of breast ovarian or colon cancer in the family. Of course she has a history of thyroid cancer  GYNECOLOGIC HISTORY:  No LMP recorded. (Menstrual status: IUD). Menarche age 32, she is GX P0. She has a Mirena in place.  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 She is divorced. The patient is not a church attender.    ADVANCED DIRECTIVES: the patient has named her sister Aram Beecham as her healthcare power of attorney  HEALTH MAINTENANCE: Social History   Tobacco Use  . Smoking status: Former Smoker    Packs/day: 0.50    Types: Cigarettes    Last attempt to quit: 09/29/2007    Years since quitting: 9.8  . Smokeless tobacco: Never Used  . Tobacco comment: Smoked on and off for 20 years. Quit about 8 years ago (as of 08/2015)  Substance Use Topics  . Alcohol use: Yes    Alcohol/week: 0.0 oz    Comment: occasionally  . Drug use: No     Colonoscopy:  PAP:  Bone density:   Allergies  Allergen Reactions  . Penicillins Shortness Of Breath, Rash and Other (See Comments)    Has patient had a PCN reaction causing immediate rash, facial/tongue/throat swelling, SOB or lightheadedness with hypotension: Yes Has patient had a PCN reaction causing severe rash involving mucus membranes or skin necrosis: Yes Has patient had a PCN reaction that required hospitalization No Has patient had a PCN reaction occurring within the last 10 years: Yes If all of the above answers are "NO", then may proceed with Cephalosporin use.   . Clindamycin/Lincomycin Other (See Comments)    Severe stomach cramps  . Ace Inhibitors Other (See Comments) and Cough    REACTION: cough; denies airway involvement     Current Outpatient Medications  Medication Sig Dispense Refill  . aspirin 81 MG chewable tablet Chew 81 mg by mouth daily.    . Cholecalciferol (EQL VITAMIN D3) 2000  units CAPS Take 2,000 Units daily by mouth.    . Dulaglutide (TRULICITY) 1.5 MG/0.5ML SOPN Inject 1.5 mg into the skin once a week. 4 pen 11  . glucose blood (ACCU-CHEK GUIDE) test strip 1 each by Other route 2 (two) times daily. Used to check blood sugars 1x daily. 180 each 3  . Insulin Glargine (LANTUS SOLOSTAR) 100 UNIT/ML Solostar Pen Inject 70  Units into the skin every morning. And pen needles 1/day 30 pen 11  . levothyroxine (SYNTHROID, LEVOTHROID) 150 MCG tablet TAKE ONE TABLET BY MOUTH DAILY BEFORE BREAKFAST. 30 tablet 0  . losartan (COZAAR) 50 MG tablet TAKE 1 TABLET (50 MG TOTAL) BY MOUTH DAILY. 30 tablet 1  . LYRICA 75 MG capsule TAKE 1 CAPSULE BY MOUTH TWICE DAILY 60 capsule 4  . metFORMIN (GLUCOPHAGE) 1000 MG tablet Take 1 tablet (1,000 mg total) by mouth 2 (two) times daily with a meal. 180 tablet 3  . pantoprazole (PROTONIX) 40 MG tablet TAKE 1 TABLET (40 MG TOTAL) BY MOUTH DAILY. 90 tablet 2  . rosuvastatin (CRESTOR) 10 MG tablet Take 1 tablet (10 mg total) by mouth daily. 90 tablet 3  . tamoxifen (NOLVADEX) 20 MG tablet Take 20 mg daily by mouth.   10  . Vitamin D, Ergocalciferol, (DRISDOL) 50000 units CAPS capsule Take 1 capsule (50,000 Units total) by mouth every 7 (seven) days. 4 capsule 0   No current facility-administered medications for this visit.     OBJECTIVE: Middle-aged white woman in no acute distress  Vitals:   07/16/17 1517  BP: 119/86  Pulse: 99  Resp: 18  Temp: 98.8 F (37.1 C)     Body mass index is 40.63 kg/m.    ECOG FS:1 - Symptomatic but completely ambulatory  Sclerae unicteric, pupils round and equal No cervical or supraclavicular adenopathy Lungs no rales or rhonchi Heart regular rate and rhythm Abd soft, obese, nontender, positive bowel sounds MSK no focal spinal tenderness, no upper extremity lymphedema Neuro: nonfocal, well oriented, appropriate affect Breasts: The right breast is status post reduction mammoplasty.  The left breast also is  status post reduction mammoplasty.  It also has undergone lumpectomy and radiation.  There is no evidence of disease recurrence.  There is significant induration associated with the incisional scars.  Both axillae are benign.  LAB RESULTS:  CMP     Component Value Date/Time   NA 138 03/21/2017 1018   NA 137 02/18/2013 1554   K 4.9 03/21/2017 1018   K 4.4 02/18/2013 1554   CL 99 03/21/2017 1018   CO2 23 03/21/2017 1018   CO2 25 02/18/2013 1554   GLUCOSE 236 (H) 03/21/2017 1018   GLUCOSE 252 (H) 09/28/2016 1451   GLUCOSE 300 (H) 02/18/2013 1554   BUN 16 03/21/2017 1018   BUN 14.2 02/18/2013 1554   CREATININE 0.68 03/21/2017 1018   CREATININE 0.8 02/18/2013 1554   CALCIUM 9.0 03/21/2017 1018   CALCIUM 9.2 02/18/2013 1554   PROT 6.5 03/21/2017 1018   PROT 7.2 02/18/2013 1554   ALBUMIN 3.7 03/21/2017 1018   ALBUMIN 3.6 02/18/2013 1554   AST 9 03/21/2017 1018   AST 10 02/18/2013 1554   ALT 14 03/21/2017 1018   ALT 16 02/18/2013 1554   ALKPHOS 79 03/21/2017 1018   ALKPHOS 85 02/18/2013 1554   BILITOT 0.3 03/21/2017 1018   BILITOT 0.33 02/18/2013 1554   GFRNONAA 101 03/21/2017 1018   GFRAA 116 03/21/2017 1018    INo results found for: SPEP, UPEP  Lab Results  Component Value Date   WBC 10.3 07/16/2017   NEUTROABS 7.5 (H) 07/16/2017   HGB 13.0 07/16/2017   HCT 39.4 07/16/2017   MCV 84.9 07/16/2017   PLT 274 07/16/2017      Chemistry      Component Value Date/Time   NA 138 03/21/2017 1018   NA 137 02/18/2013 1554   K 4.9  03/21/2017 1018   K 4.4 02/18/2013 1554   CL 99 03/21/2017 1018   CO2 23 03/21/2017 1018   CO2 25 02/18/2013 1554   BUN 16 03/21/2017 1018   BUN 14.2 02/18/2013 1554   CREATININE 0.68 03/21/2017 1018   CREATININE 0.8 02/18/2013 1554      Component Value Date/Time   CALCIUM 9.0 03/21/2017 1018   CALCIUM 9.2 02/18/2013 1554   ALKPHOS 79 03/21/2017 1018   ALKPHOS 85 02/18/2013 1554   AST 9 03/21/2017 1018   AST 10 02/18/2013 1554   ALT 14  03/21/2017 1018   ALT 16 02/18/2013 1554   BILITOT 0.3 03/21/2017 1018   BILITOT 0.33 02/18/2013 1554       No results found for: LABCA2  No components found for: LABCA125  No results for input(s): INR in the last 168 hours.  Urinalysis    Component Value Date/Time   COLORURINE YELLOW 03/15/2009 0835   APPEARANCEUR CLOUDY (A) 03/15/2009 0835   LABSPEC 1.024 03/15/2009 0835   PHURINE 7.5 03/15/2009 0835   GLUCOSEU NEGATIVE 03/15/2009 0835   GLUCOSEU NEGATIVE 01/09/2008 1524   HGBUR NEGATIVE 03/15/2009 0835   BILIRUBINUR NEGATIVE 03/15/2009 0835   KETONESUR NEGATIVE 03/15/2009 0835   PROTEINUR NEGATIVE 03/15/2009 0835   UROBILINOGEN 1.0 03/15/2009 0835   NITRITE NEGATIVE 03/15/2009 0835   LEUKOCYTESUR TRACE (A) 03/15/2009 0835     STUDIES: Mm Diag Breast Tomo Bilateral  Result Date: 06/19/2017 CLINICAL DATA:  LEFT lumpectomy in 2014. LEFT lumpectomy with radiation therapy in 2017. Patient had bilateral breast reduction in 2017. EXAM: DIGITAL DIAGNOSTIC BILATERAL MAMMOGRAM WITH CAD AND TOMO COMPARISON:  Multiple prior studies including 05/23/2016 ACR Breast Density Category b: There are scattered areas of fibroglandular density. FINDINGS: Post operative changes are seen bilaterally. No suspicious mass, distortion, or microcalcifications are identified to suggest presence of malignancy. Mammographic images were processed with CAD. IMPRESSION: No mammographic evidence for malignancy. RECOMMENDATION: Diagnostic mammogram is suggested in 1 year. (Code:DM-B-01Y) I have discussed the findings and recommendations with the patient. Results were also provided in writing at the conclusion of the visit. If applicable, a reminder letter will be sent to the patient regarding the next appointment. BI-RADS CATEGORY  2: Benign. Electronically Signed   By: Norva Pavlov M.D.   On: 06/19/2017 16:25     ELIGIBLE FOR AVAILABLE RESEARCH PROTOCOL: No  ASSESSMENT: 52 y.o. BRCA negative [place], [place]  woman   (1) status post central left breast lumpectomy 01/06/2013 for a 1.5 cm ductal carcinoma in situ, grade 1, estrogen receptor 99% positive, progesterone receptor 100% positive, with a focally positive margin  (2) status post additional surgery for margin clearance 01/22/2013 showed the posterior margin negative at 0.1 cm  (3) the patient decided against adjuvant radiation; she was supposed to have started tamoxifen May 2015,  (4) left breast upper outer quadrant biopsy 09/02/2015 finds ductal carcinoma in situ, grade 1, with not enough tissue for prognostic panel  (a) upper outer quadrant biopsy retroareolar biopsy 09/16/2015, found to fibroadipose tissue and fat necrosis but no evidence of malignancy  (5) genetics testing 09/21/2015 through the [facility] Common Hereditary Cancers Panel (Breast, Gyn, GI) performed by [facility] [place]) found no deleterious mutations in APC, ATM, AXIN2, BARD1, BMPR1A, BRCA1, BRCA2, BRIP1, CDH1, CDKN2A, CHEK2, DICER1, EPCAM, GREM1, KIT, MEN1, MLH1, MSH2, MSH6, MUTYH, NBN, NF1, PALB2, PDGFRA, PMS2, POLD1, POLE, PTEN, RAD50, RAD51C, RAD51D, SDHA, SDHB, SDHC, SDHD, SMAD4, SMARCA4, STK11, TP53, TSC1, TSC2, and VHL  (6) repeat  left lumpectomy and sentinel node biopsyy 10/12/2015 documented a pT2  PN0, stage IIA  Invasive ductal carcinoma, grade 1, with a focally positive margin; estrogen and progesterone receptor positive, HER-2 nonamplified, with an MIB-1 of 3%.  (a)  Left breast reexcision with bilateral oncoplastic reconstruction 10/12/2015 achieved clear margins, with no evidence of malignancy  (7) Oncotype DX score of 3 predicts a risk of recurrence outside the breast in 10 years of 4% if the patient's only systemic therapy is tamoxifen for 5 years. It also predicts no benefit from chemotherapy.  (8) adjuvant radiation11/08/17-12/19/17I, Ruthann Cancer MD, have reviewed the above documentation for accuracy and completeness, and I agree  with the above.  Site/dose:  1) Left breast DIBH / 50.4 Gy in 28 fractions                         2) Left breast boost / 12 Gy in 6 fractions  (8) tamoxifen started 03/06/2016  (a) Mirena IUD in place  PLAN: [name] is now a year and a half out from definitive surgery for her breast cancer with no evidence of disease recurrence.  This is favorable.  She is tolerating tamoxifen well and the plan will be to continue that a minimum of 5 years.  She is enjoying the weight reduction program.  She has not yet started the exercise portion but she has already lost about 16 pounds.  This should also help with her diabetes problem  For the nighttime hot flashes I think she would benefit from gabapentin.  We discussed that at length.  I went ahead and placed the prescription in for her.  She will let me know if she has any side effects.  When she runs out of Lyrica if she wants to try gabapentin in the morning she may.  I cautioned her against sleepiness during working hours  Otherwise she will return to see me in 1 year.  She knows to call for any problems that may develop before the next visit.  Ieesha Abbasi, [name], MD  07/16/17 3:41 PM Medical Oncology and Hematology [facility] [address] [place], [place] [address] Tel. [phone]    Fax. [phone]  Fonnie Birkenhead, am acting as scribe for Lowella Dell MD.  I, Ruthann Cancer MD, have reviewed the above documentation for accuracy and completeness, and I agree with the above.

## 2017-07-16 ENCOUNTER — Inpatient Hospital Stay (HOSPITAL_BASED_OUTPATIENT_CLINIC_OR_DEPARTMENT_OTHER): Payer: No Typology Code available for payment source | Admitting: Oncology

## 2017-07-16 ENCOUNTER — Telehealth: Payer: Self-pay | Admitting: Oncology

## 2017-07-16 ENCOUNTER — Inpatient Hospital Stay: Payer: No Typology Code available for payment source | Attending: Oncology

## 2017-07-16 VITALS — BP 119/86 | HR 99 | Temp 98.8°F | Resp 18 | Ht 64.0 in | Wt 236.7 lb

## 2017-07-16 DIAGNOSIS — C50112 Malignant neoplasm of central portion of left female breast: Secondary | ICD-10-CM

## 2017-07-16 DIAGNOSIS — C73 Malignant neoplasm of thyroid gland: Secondary | ICD-10-CM

## 2017-07-16 DIAGNOSIS — N951 Menopausal and female climacteric states: Secondary | ICD-10-CM | POA: Diagnosis not present

## 2017-07-16 DIAGNOSIS — D0510 Intraductal carcinoma in situ of unspecified breast: Secondary | ICD-10-CM

## 2017-07-16 DIAGNOSIS — Z170 Estrogen receptor positive status [ER+]: Secondary | ICD-10-CM

## 2017-07-16 DIAGNOSIS — E119 Type 2 diabetes mellitus without complications: Secondary | ICD-10-CM | POA: Diagnosis not present

## 2017-07-16 DIAGNOSIS — Z923 Personal history of irradiation: Secondary | ICD-10-CM | POA: Insufficient documentation

## 2017-07-16 DIAGNOSIS — Z79810 Long term (current) use of selective estrogen receptor modulators (SERMs): Secondary | ICD-10-CM | POA: Diagnosis not present

## 2017-07-16 DIAGNOSIS — Z975 Presence of (intrauterine) contraceptive device: Secondary | ICD-10-CM | POA: Diagnosis not present

## 2017-07-16 DIAGNOSIS — C50912 Malignant neoplasm of unspecified site of left female breast: Secondary | ICD-10-CM | POA: Diagnosis not present

## 2017-07-16 DIAGNOSIS — Z86000 Personal history of in-situ neoplasm of breast: Secondary | ICD-10-CM | POA: Insufficient documentation

## 2017-07-16 LAB — CMP (CANCER CENTER ONLY)
ALBUMIN: 3.6 g/dL (ref 3.5–5.0)
ALK PHOS: 66 U/L (ref 40–150)
ALT: 12 U/L (ref 0–55)
ANION GAP: 9 (ref 3–11)
AST: 12 U/L (ref 5–34)
BUN: 11 mg/dL (ref 7–26)
CALCIUM: 9.3 mg/dL (ref 8.4–10.4)
CO2: 29 mmol/L (ref 22–29)
Chloride: 104 mmol/L (ref 98–109)
Creatinine: 0.67 mg/dL (ref 0.60–1.10)
GFR, Est AFR Am: >60 mL/min (ref >60)
GFR, Estimated: >60 mL/min (ref >60)
GLUCOSE: 94 mg/dL (ref 70–140)
Potassium: 4.2 mmol/L (ref 3.5–5.1)
SODIUM: 142 mmol/L (ref 136–145)
Total Bilirubin: 0.3 mg/dL (ref 0.2–1.2)
Total Protein: 6.9 g/dL (ref 6.4–8.3)

## 2017-07-16 LAB — CBC WITH DIFFERENTIAL (CANCER CENTER ONLY)
BASOS ABS: 0.1 10*3/uL (ref 0.0–0.1)
BASOS PCT: 1 %
EOS ABS: 0.1 10*3/uL (ref 0.0–0.5)
Eosinophils Relative: 1 %
HCT: 39.4 % (ref 34.8–46.6)
HEMOGLOBIN: 13 g/dL (ref 11.6–15.9)
LYMPHS PCT: 21 %
Lymphs Abs: 2.2 10*3/uL (ref 0.9–3.3)
MCH: 27.9 pg (ref 25.1–34.0)
MCHC: 32.9 g/dL (ref 31.5–36.0)
MCV: 84.9 fL (ref 79.5–101.0)
MONO ABS: 0.5 10*3/uL (ref 0.1–0.9)
MONOS PCT: 5 %
NEUTROS ABS: 7.5 10*3/uL — AB (ref 1.5–6.5)
Neutrophils Relative %: 72 %
Platelet Count: 274 10*3/uL (ref 145–400)
RBC: 4.65 MIL/uL (ref 3.70–5.45)
RDW: 14.3 % (ref 11.2–14.5)
WBC Count: 10.3 10*3/uL (ref 3.9–10.3)

## 2017-07-16 MED ORDER — GABAPENTIN 300 MG PO CAPS: 300.0000 mg | ORAL_CAPSULE | Freq: Every day | ORAL | 4 refills | Status: DC

## 2017-07-16 MED FILL — GABAPENTIN 300 MG CAPSULE: 300 | 90 days supply | Qty: 90 | Fill #0

## 2017-07-16 NOTE — Telephone Encounter (Signed)
Patient decline avs and calendar °

## 2017-07-18 ENCOUNTER — Ambulatory Visit (INDEPENDENT_AMBULATORY_CARE_PROVIDER_SITE_OTHER): Payer: No Typology Code available for payment source | Admitting: Physician Assistant

## 2017-07-18 ENCOUNTER — Encounter (INDEPENDENT_AMBULATORY_CARE_PROVIDER_SITE_OTHER): Payer: Self-pay

## 2017-07-19 ENCOUNTER — Encounter (INDEPENDENT_AMBULATORY_CARE_PROVIDER_SITE_OTHER): Payer: Self-pay

## 2017-07-19 ENCOUNTER — Encounter: Payer: Self-pay | Admitting: Internal Medicine

## 2017-07-19 DIAGNOSIS — E119 Type 2 diabetes mellitus without complications: Secondary | ICD-10-CM

## 2017-07-20 ENCOUNTER — Ambulatory Visit (INDEPENDENT_AMBULATORY_CARE_PROVIDER_SITE_OTHER): Payer: No Typology Code available for payment source | Admitting: Endocrinology

## 2017-07-20 ENCOUNTER — Encounter: Payer: Self-pay | Admitting: Endocrinology

## 2017-07-20 VITALS — BP 120/82 | HR 105 | Wt 239.2 lb

## 2017-07-20 DIAGNOSIS — Z794 Long term (current) use of insulin: Secondary | ICD-10-CM | POA: Diagnosis not present

## 2017-07-20 DIAGNOSIS — E1142 Type 2 diabetes mellitus with diabetic polyneuropathy: Secondary | ICD-10-CM | POA: Diagnosis not present

## 2017-07-20 LAB — POCT GLYCOSYLATED HEMOGLOBIN (HGB A1C): Hemoglobin A1C: 7.9 % — AB (ref 4.0–5.6)

## 2017-07-20 MED ORDER — INSULIN GLARGINE 100 UNIT/ML SOLOSTAR PEN: 60.0000 [IU] | PEN_INJECTOR | SUBCUTANEOUS | 11 refills | Status: DC

## 2017-07-20 MED ORDER — DAPAGLIFLOZIN PROPANEDIOL 5 MG PO TABS: 5.0000 mg | ORAL_TABLET | Freq: Every day | ORAL | 3 refills | Status: DC

## 2017-07-20 MED FILL — FARXIGA 5 MG TABLET: 5 | 90 days supply | Qty: 90 | Fill #0

## 2017-07-20 NOTE — Patient Instructions (Addendum)
check your blood sugar once a day.  vary the time of day when you check, between before the 3 meals, and at bedtime.  also check if you have symptoms of your blood sugar being too high or too low.  please keep a record of the readings and bring it to your next appointment here.  You can write it on any piece of paper.  please call us sooner if your blood sugar goes below 70, or if you have a lot of readings over 200.   I have also sent a prescription to your pharmacy, to add "farxiga."  Also, please reduce the lantus to 60 units each morning.   blood tests are requested for you today.  We'll let you know about the results.  Please come back for a follow-up appointment in 2 months.

## 2017-07-20 NOTE — Progress Notes (Signed)
Subjective:    Patient ID: Stacey Roberts, female    DOB: [birth year], 52 y.o.   MRN: [id number]  HPI Pt has stage-1 papillary adenocarcinoma of the thyroid.    2/11: thyroidectomy: T1b N0 M0.  4/11: RAI 103 mCi, with thyrogen.  12/11: neck US: single enlarged right cervical lymph node, nonspecific.   6/12: body scan (thyrogen) neg.   12/12: [place]: lymph node is stable.  6/13: [place]: overall node morphology and volume show very little change.   9/14  TG undetectable (ab neg) 3/15: TG undetectable (ab neg) 10/15 TG undetectable (ab neg).  3/17 TG undetectable (ab neg).   4/19 TG undetectable (ab neg).    Pt returns for f/u of diabetes mellitus: DM type: Insulin-requiring type 2 Dx'ed: 1941 Complications: painful neuropathy of the lower extremities. Therapy: insulin since early 2017 (also byetta and 2 oral meds).   GDM: never.  DKA: never.   Severe hypoglycemia: never.   Pancreatitis: never.  Other: she declines multiple daily injections; edema precudes pioglitizone rx.  She declines bariatric surgery for now.   Interval history: She never misses the insulin.  no cbg record, but states cbg's vary from 93-170.   Past Medical History:  Diagnosis Date  . Allergy    seasonal  . Breast cancer ([facility]) 2014  . Breast cancer ([facility]) 2017  . Bronchitis    hx of  . Diabetes mellitus, type II ([facility])   . GERD (gastroesophageal reflux disease)   . Headache   . History of radiation therapy 12/01/15-01/11/16   Left breast DIBH / 50.4 Gy in 28 fractions and Left breast boost / 12 Gy in 6 fractions  . Hyperlipidemia   . Hypertension   . Hypothyroidism   . Joint pain   . Obesity   . PCOS (polycystic ovarian syndrome)   . Personal history of radiation therapy 2017  . Pneumonia    as a child  . Sleep apnea 2008   severe OSA-does not use a cpap  . Snoring   . 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 Buddy Duty    Past Surgical History:  Procedure Laterality Date  . BREAST BIOPSY  2000   s/p  . BREAST BIOPSY Left 01/22/2013   Procedure: RE-EXCISION LEFT BREAST DCIS;  Surgeon: Harl Bowie, MD;  Location: [facility];  Service: General;  Laterality: Left;  . BREAST LUMPECTOMY Left 01/06/2013   Procedure: LUMPECTOMY;  Surgeon: Harl Bowie, MD;  Location: [facility];  Service: General;  Laterality: Left;  . BREAST LUMPECTOMY Left 2017  . BREAST LUMPECTOMY WITH NEEDLE LOCALIZATION AND AXILLARY SENTINEL LYMPH NODE BX Left 09/30/2015   Procedure: Left BREAST LUMPECTOMY WITH NEEDLE LOCALIZATION AND AXILLARY SENTINEL LYMPH NODE BX;  Surgeon: Coralie Keens, MD;  Location: [facility];  Service: General;  Laterality: Left;  . BREAST RECONSTRUCTION Bilateral 10/12/2015   Procedure: BREAST oncoplastic RECONSTRUCTION;  Surgeon: Irene Limbo, MD;  Location: [facility];  Service: Plastics;  Laterality: Bilateral;  . BREAST REDUCTION SURGERY Bilateral 10/12/2015   Procedure: MAMMARY REDUCTION  (BREAST);  Surgeon: Irene Limbo, MD;  Location: [facility];  Service: Plastics;  Laterality: Bilateral;  . DILATION AND CURETTAGE OF UTERUS  2004   s/p  . EXCISION OF BREAST LESION Left 10/12/2015   Procedure: Re-EXCISION OF BREAST;  Surgeon: Coralie Keens, MD;  Location: [facility];  Service:  General;  Laterality: Left;  . REDUCTION MAMMAPLASTY Bilateral 09/2015  . THYROIDECTOMY  02/2009   Dr Harlow Asa  . TONSILLECTOMY  1982    Social History   Socioeconomic History  . Marital status: Single    Spouse name: Not on file  . Number of children: Not on file  . Years of education: Not on file  . Highest education level: Not on file  Occupational History    Employer: [employer]    Comment: Outpatient scheduling in radiology  Social Needs  . Financial resource strain: Not on file  . Food insecurity:    Worry: Not on file     Inability: Not on file  . Transportation needs:    Medical: Not on file    Non-medical: Not on file  Tobacco Use  . Smoking status: Former Smoker    Packs/day: 0.50    Types: Cigarettes    Last attempt to quit: 09/29/2007    Years since quitting: 9.8  . Smokeless tobacco: Never Used  . Tobacco comment: Smoked on and off for 20 years. Quit about 8 years ago (as of 08/2015)  Substance and Sexual Activity  . Alcohol use: Yes    Alcohol/week: 0.0 oz    Comment: occasionally  . Drug use: No  . Sexual activity: Yes  Lifestyle  . Physical activity:    Days per week: Not on file    Minutes per session: Not on file  . Stress: Not on file  Relationships  . Social connections:    Talks on phone: Not on file    Gets together: Not on file    Attends religious service: Not on file    Active member of club or organization: Not on file    Attends meetings of clubs or organizations: Not on file    Relationship status: Not on file  . Intimate partner violence:    Fear of current or ex partner: Not on file    Emotionally abused: Not on file    Physically abused: Not on file    Forced sexual activity: Not on file  Other Topics Concern  . Not on file  Social History Narrative   The patient is divorced, moved to [place] in   2006 from [place].  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    No alcohol.  Tobacco use:  She quit 5 years ago.  She smoked on   and off for approximately 20 years.  No history of recreational drug   Use.Drinks two cups of coffee per work day.     Current Outpatient Medications on File Prior to Visit  Medication Sig Dispense Refill  . aspirin 81 MG chewable tablet Chew 81 mg by mouth daily.    . Cholecalciferol (EQL VITAMIN D3) 2000 units CAPS Take 2,000 Units daily by mouth.    . Dulaglutide (TRULICITY) 1.5 UM/3.5TI SOPN Inject 1.5 mg into the skin once a week. 4 pen 11    . gabapentin (NEURONTIN) 300 MG capsule Take 1 capsule (300 mg total) by mouth at bedtime. 90 capsule 4  . glucose blood (ACCU-CHEK GUIDE) test strip 1 each by Other route 2 (two) times daily. Used to check blood sugars 1x daily. 180 each 3  . levothyroxine (SYNTHROID, LEVOTHROID) 150 MCG tablet TAKE ONE TABLET BY MOUTH DAILY BEFORE BREAKFAST. 30 tablet 0  . losartan (COZAAR) 50 MG tablet  TAKE 1 TABLET (50 MG TOTAL) BY MOUTH DAILY. 30 tablet 1  . LYRICA 75 MG capsule TAKE 1 CAPSULE BY MOUTH TWICE DAILY 60 capsule 4  . metFORMIN (GLUCOPHAGE) 1000 MG tablet Take 1 tablet (1,000 mg total) by mouth 2 (two) times daily with a meal. 180 tablet 3  . pantoprazole (PROTONIX) 40 MG tablet TAKE 1 TABLET (40 MG TOTAL) BY MOUTH DAILY. 90 tablet 2  . rosuvastatin (CRESTOR) 10 MG tablet Take 1 tablet (10 mg total) by mouth daily. 90 tablet 3  . tamoxifen (NOLVADEX) 20 MG tablet Take 20 mg daily by mouth.   10  . Vitamin D, Ergocalciferol, (DRISDOL) 50000 units CAPS capsule Take 1 capsule (50,000 Units total) by mouth every 7 (seven) days. 4 capsule 0  . [DISCONTINUED] Calcium Carbonate 1500 MG TABS Take 1,500 mg by mouth daily.       No current facility-administered medications on file prior to visit.     Allergies  Allergen Reactions  . Penicillins Shortness Of Breath, Rash and Other (See Comments)    Has patient had a PCN reaction causing immediate rash, facial/tongue/throat swelling, SOB or lightheadedness with hypotension: Yes Has patient had a PCN reaction causing severe rash involving mucus membranes or skin necrosis: Yes Has patient had a PCN reaction that required hospitalization No Has patient had a PCN reaction occurring within the last 10 years: Yes If all of the above answers are "NO", then may proceed with Cephalosporin use.   . Clindamycin/Lincomycin Other (See Comments)    Severe stomach cramps  . Ace Inhibitors Other (See Comments) and Cough    REACTION: cough; denies airway  involvement     Family History  Problem Relation Age of Onset  . Dementia Father        deceased age 71 secondary to dementia  . Bipolar disorder Father   . Emphysema Father   . Heart disease Father   . COPD Father        smoker  . Hypertension Father   . Depression Father   . Alcoholism Father   . CAD Mother 84       Died age 27 of CAD  . Heart disease Mother   . Graves' disease Mother   . Hypertension Mother   . Thyroid disease Mother   . CAD Maternal Grandfather 60  . Heart disease Maternal Grandfather   . Other Sister 15       hysterectomy for fibroids  . Prostate cancer Brother        low grade; w/ surveillance  . Thyroid nodules Brother 36  . Fibroids Other        niece dx approx 19  . Hypertension Other   . Kidney cancer Cousin        maternal 1st cousin dx 26-47; former smoker  . Lung cancer Other        maternal great aunt (MGM's sister); not a smoker  . Cancer Other        nephew dx neuroblastoma at 21.76 years old  . Colon cancer Neg Hx   . Colon polyps Neg Hx     BP 120/82 (BP Location: Left Arm, Patient Position: Sitting, Cuff Size: Normal)   Pulse (!) 105   Wt 239 lb 3.2 oz (108.5 kg)   SpO2 97%   BMI 41.06 kg/m    Review of Systems She has lost a few lbs    Objective:   Physical Exam VITAL SIGNS:  See vs page  GENERAL: no distress Pulses: foot pulses are intact bilaterally.   MSK: no deformity of the feet or ankles.  CV: 1+ bilat edema of the legs.  Skin:  no ulcer on the feet or ankles.  normal color and temp on the feet and ankles Neuro: sensation is intact to touch on the feet and ankles.      Lab Results  Component Value Date   HGBA1C 7.9 (A) 07/20/2017   Lab Results  Component Value Date   CREATININE 0.67 07/16/2017   BUN 11 07/16/2017   NA 142 07/16/2017   K 4.2 07/16/2017   CL 104 07/16/2017   CO2 29 07/16/2017      Assessment & Plan:  Insulin-requiring type 2 DM: this is the best control this pt should aim for, given  this regimen, which does match insulin to her changing needs throughout the day.  Weight loss: pt is advised to continue her efforts, but farxiga will help.     Patient Instructions  check your blood sugar once a day.  vary the time of day when you check, between before the 3 meals, and at bedtime.  also check if you have symptoms of your blood sugar being too high or too low.  please keep a record of the readings and bring it to your next appointment here.  You can write it on any piece of paper.  please call us sooner if your blood sugar goes below 70, or if you have a lot of readings over 200.   I have also sent a prescription to your pharmacy, to add "farxiga."  Also, please reduce the lantus to 60 units each morning.   blood tests are requested for you today.  We'll let you know about the results.  Please come back for a follow-up appointment in 2 months.

## 2017-07-23 ENCOUNTER — Other Ambulatory Visit (INDEPENDENT_AMBULATORY_CARE_PROVIDER_SITE_OTHER): Payer: Self-pay | Admitting: Family Medicine

## 2017-07-23 DIAGNOSIS — E559 Vitamin D deficiency, unspecified: Secondary | ICD-10-CM

## 2017-07-23 MED FILL — PENTIPS 31G X 5 MM MISC: 31G X 5 MM | 90 days supply | Qty: 100 | Fill #0

## 2017-07-23 MED FILL — ROSUVASTATIN CALCIUM 10 MG: 10 | 90 days supply | Qty: 90 | Fill #3

## 2017-07-23 MED FILL — LANTUS SOLOSTAR 100 UNITS/M: 100 | 34 days supply | Qty: 30 | Fill #0

## 2017-07-23 MED FILL — metFORMIN HCL 1000 MG TABS: 1000 | 90 days supply | Qty: 180 | Fill #1

## 2017-08-01 ENCOUNTER — Ambulatory Visit (INDEPENDENT_AMBULATORY_CARE_PROVIDER_SITE_OTHER): Payer: No Typology Code available for payment source | Admitting: Physician Assistant

## 2017-08-01 VITALS — BP 101/70 | HR 108 | Temp 97.6°F | Ht 64.0 in | Wt 231.0 lb

## 2017-08-01 DIAGNOSIS — I10 Essential (primary) hypertension: Secondary | ICD-10-CM

## 2017-08-01 DIAGNOSIS — E559 Vitamin D deficiency, unspecified: Secondary | ICD-10-CM

## 2017-08-01 DIAGNOSIS — Z9189 Other specified personal risk factors, not elsewhere classified: Secondary | ICD-10-CM | POA: Diagnosis not present

## 2017-08-01 DIAGNOSIS — Z6839 Body mass index (BMI) 39.0-39.9, adult: Secondary | ICD-10-CM

## 2017-08-01 MED ORDER — VITAMIN D (ERGOCALCIFEROL) 1.25 MG (50000 UNIT) PO CAPS: 50000.0000 [IU] | ORAL_CAPSULE | ORAL | 0 refills | Status: DC

## 2017-08-01 MED FILL — TRULICITY 1.5 MG/0.5 ML PEN: 1.5 | 28 days supply | Qty: 2 | Fill #2 | Status: TO

## 2017-08-01 NOTE — Progress Notes (Signed)
Office: [phone]  /  Fax: [phone]   HPI:   Chief Complaint: OBESITY Stacey Roberts is here to discuss her progress with her obesity treatment plan. She is on the Category 2 plan and is following her eating plan approximately 50 % of the time. She states she is walking for 30 minutes 5 times per week. Asyah continues to do well with weight loss. States she continues to have challenges with pre-planning her meals.  Her weight is 231 lb (104.8 kg) today and has had a weight loss of 1 pound over a period of 4 weeks since her last visit. She has lost 36 lbs since starting treatment with [place].  Vitamin D Deficiency Stacey Roberts has a diagnosis of vitamin D deficiency. She is currently taking prescription Vit D and denies nausea, vomiting or muscle weakness.  Hypertension Stacey Roberts is a 52 y.o. female with hypertension. Harbour's blood pressure is stable, low normal at 101/70. She is on losartan 50 mg. She admits to feeling more tired lately. She denies chest pain or shortness of breath. She is working weight loss to help control her blood pressure with the goal of decreasing her risk of heart attack and stroke. Stacey Roberts's blood pressure is currently controlled.  At risk for cardiovascular disease Stacey Roberts is at a higher than average risk for cardiovascular disease due to obesity and hypertension. She currently denies any chest pain.  ALLERGIES: Allergies  Allergen Reactions  . Penicillins Shortness Of Breath, Rash and Other (See Comments)    Has patient had a PCN reaction causing immediate rash, facial/tongue/throat swelling, SOB or lightheadedness with hypotension: Yes Has patient had a PCN reaction causing severe rash involving mucus membranes or skin necrosis: Yes Has patient had a PCN reaction that required hospitalization No Has patient had a PCN reaction occurring within the last 10 years: Yes If all of the above answers are "NO", then may proceed with Cephalosporin use.   . Clindamycin/Lincomycin  Other (See Comments)    Severe stomach cramps  . Ace Inhibitors Other (See Comments) and Cough    REACTION: cough; denies airway involvement     MEDICATIONS: Current Outpatient Medications on File Prior to Visit  Medication Sig Dispense Refill  . aspirin 81 MG chewable tablet Chew 81 mg by mouth daily.    . Cholecalciferol (EQL VITAMIN D3) 2000 units CAPS Take 2,000 Units daily by mouth.    . dapagliflozin propanediol (FARXIGA) 5 MG TABS tablet Take 5 mg by mouth daily. 90 tablet 3  . Dulaglutide (TRULICITY) 1.5 GN/5.6OZ SOPN Inject 1.5 mg into the skin once a week. 4 pen 11  . gabapentin (NEURONTIN) 300 MG capsule Take 1 capsule (300 mg total) by mouth at bedtime. 90 capsule 4  . glucose blood (ACCU-CHEK GUIDE) test strip 1 each by Other route 2 (two) times daily. Used to check blood sugars 1x daily. 180 each 3  . Insulin Glargine (LANTUS SOLOSTAR) 100 UNIT/ML Solostar Pen Inject 60 Units into the skin every morning. And pen needles 1/day 30 pen 11  . levothyroxine (SYNTHROID, LEVOTHROID) 150 MCG tablet TAKE ONE TABLET BY MOUTH DAILY BEFORE BREAKFAST. 30 tablet 0  . losartan (COZAAR) 50 MG tablet TAKE 1 TABLET (50 MG TOTAL) BY MOUTH DAILY. (Patient taking differently: Take 25 mg by mouth daily. ) 30 tablet 1  . LYRICA 75 MG capsule TAKE 1 CAPSULE BY MOUTH TWICE DAILY 60 capsule 4  . metFORMIN (GLUCOPHAGE) 1000 MG tablet Take 1 tablet (1,000 mg total) by mouth 2 (  two) times daily with a meal. 180 tablet 3  . pantoprazole (PROTONIX) 40 MG tablet TAKE 1 TABLET (40 MG TOTAL) BY MOUTH DAILY. 90 tablet 2  . rosuvastatin (CRESTOR) 10 MG tablet Take 1 tablet (10 mg total) by mouth daily. 90 tablet 3  . tamoxifen (NOLVADEX) 20 MG tablet Take 20 mg daily by mouth.   10  . Vitamin D, Ergocalciferol, (DRISDOL) 50000 units CAPS capsule Take 1 capsule (50,000 Units total) by mouth every 7 (seven) days. 4 capsule 0  . [DISCONTINUED] Calcium Carbonate 1500 MG TABS Take 1,500 mg by mouth daily.       No  current facility-administered medications on file prior to visit.     PAST MEDICAL HISTORY: Past Medical History:  Diagnosis Date  . Allergy    seasonal  . Breast cancer ([place]) 2014  . Breast cancer ([place]) 2017  . Bronchitis    hx of  . Diabetes mellitus, type II ([place])   . GERD (gastroesophageal reflux disease)   . Headache   . History of radiation therapy 12/01/15-01/11/16   Left breast DIBH / 50.4 Gy in 28 fractions and Left breast boost / 12 Gy in 6 fractions  . Hyperlipidemia   . Hypertension   . Hypothyroidism   . Joint pain   . Obesity   . PCOS (polycystic ovarian syndrome)   . Personal history of radiation therapy 2017  . Pneumonia    as a child  . Sleep apnea 2008   severe OSA-does not use a cpap  . Snoring   . 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 Buddy Duty    PAST SURGICAL HISTORY: Past Surgical History:  Procedure Laterality Date  . BREAST BIOPSY  2000   s/p  . BREAST BIOPSY Left 01/22/2013   Procedure: RE-EXCISION LEFT BREAST DCIS;  Surgeon: Harl Bowie, MD;  Location: [facility];  Service: General;  Laterality: Left;  . BREAST LUMPECTOMY Left 01/06/2013   Procedure: LUMPECTOMY;  Surgeon: Harl Bowie, MD;  Location: [facility];  Service: General;  Laterality: Left;  . BREAST LUMPECTOMY Left 2017  . BREAST LUMPECTOMY WITH NEEDLE LOCALIZATION AND AXILLARY SENTINEL LYMPH NODE BX Left 09/30/2015   Procedure: Left BREAST LUMPECTOMY WITH NEEDLE LOCALIZATION AND AXILLARY SENTINEL LYMPH NODE BX;  Surgeon: Coralie Keens, MD;  Location: [facility];  Service: General;  Laterality: Left;  . BREAST RECONSTRUCTION Bilateral 10/12/2015   Procedure: BREAST oncoplastic RECONSTRUCTION;  Surgeon: Irene Limbo, MD;  Location: [facility];  Service: Plastics;  Laterality: Bilateral;  . BREAST REDUCTION SURGERY Bilateral 10/12/2015    Procedure: MAMMARY REDUCTION  (BREAST);  Surgeon: Irene Limbo, MD;  Location: [facility];  Service: Plastics;  Laterality: Bilateral;  . DILATION AND CURETTAGE OF UTERUS  2004   s/p  . EXCISION OF BREAST LESION Left 10/12/2015   Procedure: Re-EXCISION OF BREAST;  Surgeon: Coralie Keens, MD;  Location: [facility];  Service: General;  Laterality: Left;  . REDUCTION MAMMAPLASTY Bilateral 09/2015  . THYROIDECTOMY  02/2009   Dr Harlow Asa  . TONSILLECTOMY  1982    SOCIAL HISTORY: Social History   Tobacco Use  . Smoking status: Former Smoker    Packs/day: 0.50    Types: Cigarettes    Last attempt to quit: 09/29/2007    Years since quitting: 9.8  . Smokeless tobacco: Never Used  . Tobacco comment: Smoked on  and off for 20 years. Quit about 8 years ago (as of 08/2015)  Substance Use Topics  . Alcohol use: Yes    Alcohol/week: 0.0 oz    Comment: occasionally  . Drug use: No    FAMILY HISTORY: Family History  Problem Relation Age of Onset  . Dementia Father        deceased age 40 secondary to dementia  . Bipolar disorder Father   . Emphysema Father   . Heart disease Father   . COPD Father        smoker  . Hypertension Father   . Depression Father   . Alcoholism Father   . CAD Mother 51       Died age 81 of CAD  . Heart disease Mother   . Graves' disease Mother   . Hypertension Mother   . Thyroid disease Mother   . CAD Maternal Grandfather 60  . Heart disease Maternal Grandfather   . Other Sister 4       hysterectomy for fibroids  . Prostate cancer Brother        low grade; w/ surveillance  . Thyroid nodules Brother 60  . Fibroids Other        niece dx approx 35  . Hypertension Other   . Kidney cancer Cousin        maternal 1st cousin dx 86-47; former smoker  . Lung cancer Other        maternal great aunt (MGM's sister); not a smoker  . Cancer Other        nephew dx neuroblastoma at 62.56 years old  . Colon cancer Neg Hx   . Colon  polyps Neg Hx     ROS: Review of Systems  Constitutional: Positive for malaise/fatigue and weight loss.  Respiratory: Negative for shortness of breath.   Cardiovascular: Negative for chest pain.  Gastrointestinal: Negative for nausea and vomiting.  Musculoskeletal:       Negative muscle weakness    PHYSICAL EXAM: Blood pressure 101/70, pulse (!) 108, temperature 97.6 F (36.4 C), temperature source Oral, height 5\' 4"  (1.626 m), weight 231 lb (104.8 kg), SpO2 95 %. Body mass index is 39.65 kg/m. Physical Exam  Constitutional: She is oriented to person, place, and time. She appears well-developed and well-nourished.  Cardiovascular: Tachycardia present.  Pulmonary/Chest: Effort normal.  Musculoskeletal: Normal range of motion.  Neurological: She is oriented to person, place, and time.  Skin: Skin is warm and dry.  Psychiatric: She has a normal mood and affect. Her behavior is normal.  Vitals reviewed.   RECENT LABS AND TESTS: BMET    Component Value Date/Time   NA 142 07/16/2017 1442   NA 138 03/21/2017 1018   NA 137 02/18/2013 1554   K 4.2 07/16/2017 1442   K 4.4 02/18/2013 1554   CL 104 07/16/2017 1442   CO2 29 07/16/2017 1442   CO2 25 02/18/2013 1554   GLUCOSE 94 07/16/2017 1442   GLUCOSE 300 (H) 02/18/2013 1554   BUN 11 07/16/2017 1442   BUN 16 03/21/2017 1018   BUN 14.2 02/18/2013 1554   CREATININE 0.67 07/16/2017 1442   CREATININE 0.8 02/18/2013 1554   CALCIUM 9.3 07/16/2017 1442   CALCIUM 9.2 02/18/2013 1554   GFRNONAA >60 07/16/2017 1442   GFRAA >60 07/16/2017 1442   Lab Results  Component Value Date   HGBA1C 7.9 (A) 07/20/2017   HGBA1C 8.0 04/24/2017   HGBA1C 9.3 (H) 03/21/2017   HGBA1C 10.4 01/10/2017  HGBA1C 9.2 (H) 09/28/2016   Lab Results  Component Value Date   INSULIN 46.3 (H) 03/21/2017   CBC    Component Value Date/Time   WBC 10.3 07/16/2017 1442   WBC 6.2 09/28/2016 1451   RBC 4.65 07/16/2017 1442   HGB 13.0 07/16/2017 1442    HGB 13.5 03/21/2017 1018   HGB 12.5 02/18/2013 1554   HCT 39.4 07/16/2017 1442   HCT 42.0 03/21/2017 1018   HCT 39.6 02/18/2013 1554   PLT 274 07/16/2017 1442   PLT 306 02/18/2013 1554   MCV 84.9 07/16/2017 1442   MCV 86 03/21/2017 1018   MCV 82.6 02/18/2013 1554   MCH 27.9 07/16/2017 1442   MCHC 32.9 07/16/2017 1442   RDW 14.3 07/16/2017 1442   RDW 14.6 03/21/2017 1018   RDW 16.4 (H) 02/18/2013 1554   LYMPHSABS 2.2 07/16/2017 1442   LYMPHSABS 1.5 03/21/2017 1018   LYMPHSABS 2.0 02/18/2013 1554   MONOABS 0.5 07/16/2017 1442   MONOABS 0.6 02/18/2013 1554   EOSABS 0.1 07/16/2017 1442   EOSABS 0.1 03/21/2017 1018   BASOSABS 0.1 07/16/2017 1442   BASOSABS 0.0 03/21/2017 1018   BASOSABS 0.0 02/18/2013 1554   Iron/TIBC/Ferritin/ %Sat    Component Value Date/Time   IRON 43 09/28/2016 1451   IRONPCTSAT 12.4 (L) 09/28/2016 1451   Lipid Panel     Component Value Date/Time   CHOL 122 03/21/2017 1018   TRIG 90 03/21/2017 1018   HDL 41 03/21/2017 1018   CHOLHDL 4 09/28/2016 1451   VLDL 32.2 09/28/2016 1451   LDLCALC 63 03/21/2017 1018   LDLDIRECT 153.0 09/01/2014 1634   Hepatic Function Panel     Component Value Date/Time   PROT 6.9 07/16/2017 1442   PROT 6.5 03/21/2017 1018   PROT 7.2 02/18/2013 1554   ALBUMIN 3.6 07/16/2017 1442   ALBUMIN 3.7 03/21/2017 1018   ALBUMIN 3.6 02/18/2013 1554   AST 12 07/16/2017 1442   AST 10 02/18/2013 1554   ALT 12 07/16/2017 1442   ALT 16 02/18/2013 1554   ALKPHOS 66 07/16/2017 1442   ALKPHOS 85 02/18/2013 1554   BILITOT 0.3 07/16/2017 1442   BILITOT 0.33 02/18/2013 1554   BILIDIR 0.0 11/15/2011 0946      Component Value Date/Time   TSH 0.33 (L) 04/24/2017 1501   TSH 0.755 03/21/2017 1018   TSH 0.42 09/28/2016 1451  Results for CALYSE, MURCIA (MRN [id number]) as of 08/01/2017 17:12  Ref. Range 03/21/2017 10:18  Vitamin D, 25-Hydroxy Latest Ref Range: 30.0 - 100.0 ng/mL 35.9    ASSESSMENT AND PLAN: Vitamin D deficiency -  Plan: Vitamin D, Ergocalciferol, (DRISDOL) 50000 units CAPS capsule  Essential hypertension  At risk for heart disease  Class 2 severe obesity with serious comorbidity and body mass index (BMI) of 39.0 to 39.9 in adult, unspecified obesity type ([place])  PLAN:  Vitamin D Deficiency Kaiana was informed that low vitamin D levels contributes to fatigue and are associated with obesity, breast, and colon cancer. Sonnet agrees to continue taking prescription Vit D @50 ,000 IU every week #4 and we will refill for 1 month. She will follow up for routine testing of vitamin D, at least 2-3 times per year. She was informed of the risk of over-replacement of vitamin D and agrees to not increase her dose unless she discusses this with [place] first. Markee agrees to follow up with our clinic in 3 weeks.  Hypertension We discussed sodium restriction, working on healthy weight loss, and a  regular exercise program as the means to achieve improved blood pressure control. Brittnei agreed with this plan and agreed to follow up as directed. We will continue to monitor her blood pressure as well as her progress with the above lifestyle modifications. Wendie agrees to decrease losartan to half a pill (25 mg) daily and keep a blood pressure log and bring in for review. She will watch for signs of hypotension as she continues her lifestyle modifications. Shizuye agrees to follow up with our clinic in 3 weeks.  Cardiovascular risk counselling Nevayah was given extended (15 minutes) coronary artery disease prevention counseling today. She is 52 y.o. female and has risk factors for heart disease including obesity and hypertension. We discussed intensive lifestyle modifications today with an emphasis on specific weight loss instructions and strategies. Pt was also informed of the importance of increasing exercise and decreasing saturated fats to help prevent heart disease.  Obesity Promise is currently in the action stage of change. As such, her goal  is to continue with weight loss efforts She has agreed to follow the Category 2 plan Tiarra has been instructed to work up to a goal of 150 minutes of combined cardio and strengthening exercise per week for weight loss and overall health benefits. We discussed the following Behavioral Modification Strategies today: increasing lean protein intake and work on meal planning and easy cooking plans   Jaeleah has agreed to follow up with our clinic in 3 weeks. She was informed of the importance of frequent follow up visits to maximize her success with intensive lifestyle modifications for her multiple health conditions.   OBESITY BEHAVIORAL INTERVENTION VISIT  Today's visit was # 8 out of 22.  Starting weight: 247 lbs Starting date: 03/21/17 Today's weight : 231 lbs  Today's date: 08/01/2017 Total lbs lost to date: 1 (Patients must lose 7 lbs in the first 6 months to continue with counseling)   ASK: We discussed the diagnosis of obesity with Roosvelt Maser today and Summerlyn agreed to give [place] permission to discuss obesity behavioral modification therapy today.  ASSESS: Keah has the diagnosis of obesity and her BMI today is 39.63 Birdia is in the action stage of change   ADVISE: Eara was educated on the multiple health risks of obesity as well as the benefit of weight loss to improve her health. She was advised of the need for long term treatment and the importance of lifestyle modifications.  AGREE: Multiple dietary modification options and treatment options were discussed and  Raley agreed to the above obesity treatment plan.   Wilhemena Durie, am acting as transcriptionist for Lacy Duverney, PA-C I, Lacy Duverney [facility], have reviewed this note and agree with its content

## 2017-08-10 ENCOUNTER — Other Ambulatory Visit: Payer: Self-pay | Admitting: Endocrinology

## 2017-08-10 MED ORDER — LEVOTHYROXINE SODIUM 150 MCG PO TABS: ORAL_TABLET | ORAL | 0 refills | Status: DC

## 2017-08-10 MED FILL — LEVOTHYROXINE 150 MCG TAB: 150 | 30 days supply | Qty: 30 | Fill #0

## 2017-08-10 MED FILL — VIT D2 1.25 MG (50,000 UNIT: 1.25 MG | 28 days supply | Qty: 4 | Fill #0

## 2017-08-10 NOTE — Telephone Encounter (Signed)
[place] pt 

## 2017-08-13 MED FILL — LOSARTAN POTASSIUM 50 MG TA: 50 | 30 days supply | Qty: 30 | Fill #0

## 2017-08-21 ENCOUNTER — Ambulatory Visit (INDEPENDENT_AMBULATORY_CARE_PROVIDER_SITE_OTHER): Payer: No Typology Code available for payment source | Admitting: Family Medicine

## 2017-08-21 VITALS — BP 106/71 | HR 107 | Temp 98.0°F | Ht 64.0 in | Wt 228.0 lb

## 2017-08-21 DIAGNOSIS — I10 Essential (primary) hypertension: Secondary | ICD-10-CM | POA: Diagnosis not present

## 2017-08-21 DIAGNOSIS — Z794 Long term (current) use of insulin: Secondary | ICD-10-CM

## 2017-08-21 DIAGNOSIS — E119 Type 2 diabetes mellitus without complications: Secondary | ICD-10-CM | POA: Diagnosis not present

## 2017-08-21 DIAGNOSIS — Z6839 Body mass index (BMI) 39.0-39.9, adult: Secondary | ICD-10-CM

## 2017-08-22 NOTE — Progress Notes (Signed)
Office: [phone]  /  Fax: [phone]   HPI:   Chief Complaint: OBESITY Stacey Roberts is here to discuss her progress with her obesity treatment plan. She is on the Category 2 plan and is following her eating plan approximately 50 % of the time. She states she is walking for 20 minutes 5 times per week. Stacey Roberts finds breakfast and lunch to be easy but struggles with dinner secondary to liking to cook. If she goes out to dinner she attempts to make make good choices.  Her weight is 228 lb (103.4 kg) today and has had a weight loss of 3 pounds over a period of 3 weeks since her last visit. She has lost 19 lbs since starting treatment with [place].  Diabetes II Stacey Roberts has a diagnosis of diabetes type II. Stacey Roberts states she is checking BGs at all different times, running around 136. She denies hypoglycemia. [name] was 7.9 on 07/20/17. She has been working on intensive lifestyle modifications including diet, exercise, and weight loss to help control her blood glucose levels.  Hypertension Stacey Roberts is a 52 y.o. female with hypertension. Stacey Roberts's blood pressure is controlled today. She denies chest pain, chest pressure, or headache. She is working weight loss to help control her blood pressure with the goal of decreasing her risk of heart attack and stroke.  ALLERGIES: Allergies  Allergen Reactions  . Penicillins Shortness Of Breath, Rash and Other (See Comments)    Has patient had a PCN reaction causing immediate rash, facial/tongue/throat swelling, SOB or lightheadedness with hypotension: Yes Has patient had a PCN reaction causing severe rash involving mucus membranes or skin necrosis: Yes Has patient had a PCN reaction that required hospitalization No Has patient had a PCN reaction occurring within the last 10 years: Yes If all of the above answers are "NO", then may proceed with Cephalosporin use.   . Clindamycin/Lincomycin Other (See Comments)    Severe stomach cramps  . Ace Inhibitors Other (See  Comments) and Cough    REACTION: cough; denies airway involvement     MEDICATIONS: Current Outpatient Medications on File Prior to Visit  Medication Sig Dispense Refill  . aspirin 81 MG chewable tablet Chew 81 mg by mouth daily.    . Cholecalciferol (EQL VITAMIN D3) 2000 units CAPS Take 2,000 Units daily by mouth.    . dapagliflozin propanediol (FARXIGA) 5 MG TABS tablet Take 5 mg by mouth daily. 90 tablet 3  . Dulaglutide (TRULICITY) 1.5 UV/2.5DG SOPN Inject 1.5 mg into the skin once a week. 4 pen 11  . gabapentin (NEURONTIN) 300 MG capsule Take 1 capsule (300 mg total) by mouth at bedtime. 90 capsule 4  . glucose blood (ACCU-CHEK GUIDE) test strip 1 each by Other route 2 (two) times daily. Used to check blood sugars 1x daily. 180 each 3  . Insulin Glargine (LANTUS SOLOSTAR) 100 UNIT/ML Solostar Pen Inject 60 Units into the skin every morning. And pen needles 1/day 30 pen 11  . levothyroxine (SYNTHROID, LEVOTHROID) 150 MCG tablet Take one tablet by mouth daily. 30 tablet 0  . levothyroxine (SYNTHROID, LEVOTHROID) 150 MCG tablet TAKE ONE TABLET BY MOUTH DAILY BEFORE BREAKFAST. 30 tablet 0  . losartan (COZAAR) 50 MG tablet TAKE 1 TABLET (50 MG TOTAL) BY MOUTH DAILY. (Patient taking differently: Take 25 mg by mouth daily. ) 30 tablet 1  . LYRICA 75 MG capsule TAKE 1 CAPSULE BY MOUTH TWICE DAILY 60 capsule 4  . metFORMIN (GLUCOPHAGE) 1000 MG tablet Take 1  tablet (1,000 mg total) by mouth 2 (two) times daily with a meal. 180 tablet 3  . pantoprazole (PROTONIX) 40 MG tablet TAKE 1 TABLET (40 MG TOTAL) BY MOUTH DAILY. 90 tablet 2  . rosuvastatin (CRESTOR) 10 MG tablet Take 1 tablet (10 mg total) by mouth daily. 90 tablet 3  . tamoxifen (NOLVADEX) 20 MG tablet Take 20 mg daily by mouth.   10  . Vitamin D, Ergocalciferol, (DRISDOL) 50000 units CAPS capsule Take 1 capsule (50,000 Units total) by mouth every 7 (seven) days. 4 capsule 0  . [DISCONTINUED] Calcium Carbonate 1500 MG TABS Take 1,500 mg by  mouth daily.       No current facility-administered medications on file prior to visit.     PAST MEDICAL HISTORY: Past Medical History:  Diagnosis Date  . Allergy    seasonal  . Breast cancer ([place]) 2014  . Breast cancer ([place]) 2017  . Bronchitis    hx of  . Diabetes mellitus, type II ([place])   . GERD (gastroesophageal reflux disease)   . Headache   . History of radiation therapy 12/01/15-01/11/16   Left breast DIBH / 50.4 Gy in 28 fractions and Left breast boost / 12 Gy in 6 fractions  . Hyperlipidemia   . Hypertension   . Hypothyroidism   . Joint pain   . Obesity   . PCOS (polycystic ovarian syndrome)   . Personal history of radiation therapy 2017  . Pneumonia    as a child  . Sleep apnea 2008   severe OSA-does not use a cpap  . Snoring   . 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 Buddy Duty    PAST SURGICAL HISTORY: Past Surgical History:  Procedure Laterality Date  . BREAST BIOPSY  2000   s/p  . BREAST BIOPSY Left 01/22/2013   Procedure: RE-EXCISION LEFT BREAST DCIS;  Surgeon: Harl Bowie, MD;  Location: [place];  Service: General;  Laterality: Left;  . BREAST LUMPECTOMY Left 01/06/2013   Procedure: LUMPECTOMY;  Surgeon: Harl Bowie, MD;  Location: [place];  Service: General;  Laterality: Left;  . BREAST LUMPECTOMY Left 2017  . BREAST LUMPECTOMY WITH NEEDLE LOCALIZATION AND AXILLARY SENTINEL LYMPH NODE BX Left 09/30/2015   Procedure: Left BREAST LUMPECTOMY WITH NEEDLE LOCALIZATION AND AXILLARY SENTINEL LYMPH NODE BX;  Surgeon: Coralie Keens, MD;  Location: [place];  Service: General;  Laterality: Left;  . BREAST RECONSTRUCTION Bilateral 10/12/2015   Procedure: BREAST oncoplastic RECONSTRUCTION;  Surgeon: Irene Limbo, MD;  Location: [place];  Service: Plastics;  Laterality: Bilateral;  . BREAST REDUCTION SURGERY  Bilateral 10/12/2015   Procedure: MAMMARY REDUCTION  (BREAST);  Surgeon: Irene Limbo, MD;  Location: [place];  Service: Plastics;  Laterality: Bilateral;  . DILATION AND CURETTAGE OF UTERUS  2004   s/p  . EXCISION OF BREAST LESION Left 10/12/2015   Procedure: Re-EXCISION OF BREAST;  Surgeon: Coralie Keens, MD;  Location: [place];  Service: General;  Laterality: Left;  . REDUCTION MAMMAPLASTY Bilateral 09/2015  . THYROIDECTOMY  02/2009   Dr Harlow Asa  . TONSILLECTOMY  1982    SOCIAL HISTORY: Social History   Tobacco Use  . Smoking status: Former Smoker    Packs/day: 0.50    Types: Cigarettes    Last attempt to quit: 09/29/2007    Years since quitting: 9.9  . Smokeless tobacco: Never  Used  . Tobacco comment: Smoked on and off for 20 years. Quit about 8 years ago (as of 08/2015)  Substance Use Topics  . Alcohol use: Yes    Alcohol/week: 0.0 oz    Comment: occasionally  . Drug use: No    FAMILY HISTORY: Family History  Problem Relation Age of Onset  . Dementia Father        deceased age 32 secondary to dementia  . Bipolar disorder Father   . Emphysema Father   . Heart disease Father   . COPD Father        smoker  . Hypertension Father   . Depression Father   . Alcoholism Father   . CAD Mother 33       Died age 5 of CAD  . Heart disease Mother   . Graves' disease Mother   . Hypertension Mother   . Thyroid disease Mother   . CAD Maternal Grandfather 60  . Heart disease Maternal Grandfather   . Other Sister 24       hysterectomy for fibroids  . Prostate cancer Brother        low grade; w/ surveillance  . Thyroid nodules Brother 47  . Fibroids Other        niece dx approx 55  . Hypertension Other   . Kidney cancer Cousin        maternal 1st cousin dx 78-47; former smoker  . Lung cancer Other        maternal great aunt (MGM's sister); not a smoker  . Cancer Other        nephew dx neuroblastoma at 69.46 years old  . Colon  cancer Neg Hx   . Colon polyps Neg Hx     ROS: Review of Systems  Constitutional: Positive for weight loss.  Cardiovascular: Negative for chest pain.       Negative chest pressure  Neurological: Negative for headaches.  Endo/Heme/Allergies:       Negative hypoglycemia    PHYSICAL EXAM: Blood pressure 106/71, pulse (!) 107, temperature 98 F (36.7 C), temperature source Oral, height 5\' 4"  (1.626 m), weight 228 lb (103.4 kg), SpO2 97 %. Body mass index is 39.14 kg/m. Physical Exam  Constitutional: She is oriented to person, place, and time. She appears well-developed and well-nourished.  Cardiovascular: Normal rate.  Pulmonary/Chest: Effort normal.  Musculoskeletal: Normal range of motion.  Neurological: She is oriented to person, place, and time.  Skin: Skin is warm and dry.  Psychiatric: She has a normal mood and affect. Her behavior is normal.  Vitals reviewed.   RECENT LABS AND TESTS: BMET    Component Value Date/Time   NA 142 07/16/2017 1442   NA 138 03/21/2017 1018   NA 137 02/18/2013 1554   K 4.2 07/16/2017 1442   K 4.4 02/18/2013 1554   CL 104 07/16/2017 1442   CO2 29 07/16/2017 1442   CO2 25 02/18/2013 1554   GLUCOSE 94 07/16/2017 1442   GLUCOSE 300 (H) 02/18/2013 1554   BUN 11 07/16/2017 1442   BUN 16 03/21/2017 1018   BUN 14.2 02/18/2013 1554   CREATININE 0.67 07/16/2017 1442   CREATININE 0.8 02/18/2013 1554   CALCIUM 9.3 07/16/2017 1442   CALCIUM 9.2 02/18/2013 1554   GFRNONAA >60 07/16/2017 1442   GFRAA >60 07/16/2017 1442   Lab Results  Component Value Date   HGBA1C 7.9 (A) 07/20/2017   HGBA1C 8.0 04/24/2017   HGBA1C 9.3 (H) 03/21/2017   HGBA1C  10.4 01/10/2017   HGBA1C 9.2 (H) 09/28/2016   Lab Results  Component Value Date   INSULIN 46.3 (H) 03/21/2017   CBC    Component Value Date/Time   WBC 10.3 07/16/2017 1442   WBC 6.2 09/28/2016 1451   RBC 4.65 07/16/2017 1442   HGB 13.0 07/16/2017 1442   HGB 13.5 03/21/2017 1018   HGB 12.5  02/18/2013 1554   HCT 39.4 07/16/2017 1442   HCT 42.0 03/21/2017 1018   HCT 39.6 02/18/2013 1554   PLT 274 07/16/2017 1442   PLT 306 02/18/2013 1554   MCV 84.9 07/16/2017 1442   MCV 86 03/21/2017 1018   MCV 82.6 02/18/2013 1554   MCH 27.9 07/16/2017 1442   MCHC 32.9 07/16/2017 1442   RDW 14.3 07/16/2017 1442   RDW 14.6 03/21/2017 1018   RDW 16.4 (H) 02/18/2013 1554   LYMPHSABS 2.2 07/16/2017 1442   LYMPHSABS 1.5 03/21/2017 1018   LYMPHSABS 2.0 02/18/2013 1554   MONOABS 0.5 07/16/2017 1442   MONOABS 0.6 02/18/2013 1554   EOSABS 0.1 07/16/2017 1442   EOSABS 0.1 03/21/2017 1018   BASOSABS 0.1 07/16/2017 1442   BASOSABS 0.0 03/21/2017 1018   BASOSABS 0.0 02/18/2013 1554   Iron/TIBC/Ferritin/ %Sat    Component Value Date/Time   IRON 43 09/28/2016 1451   IRONPCTSAT 12.4 (L) 09/28/2016 1451   Lipid Panel     Component Value Date/Time   CHOL 122 03/21/2017 1018   TRIG 90 03/21/2017 1018   HDL 41 03/21/2017 1018   CHOLHDL 4 09/28/2016 1451   VLDL 32.2 09/28/2016 1451   LDLCALC 63 03/21/2017 1018   LDLDIRECT 153.0 09/01/2014 1634   Hepatic Function Panel     Component Value Date/Time   PROT 6.9 07/16/2017 1442   PROT 6.5 03/21/2017 1018   PROT 7.2 02/18/2013 1554   ALBUMIN 3.6 07/16/2017 1442   ALBUMIN 3.7 03/21/2017 1018   ALBUMIN 3.6 02/18/2013 1554   AST 12 07/16/2017 1442   AST 10 02/18/2013 1554   ALT 12 07/16/2017 1442   ALT 16 02/18/2013 1554   ALKPHOS 66 07/16/2017 1442   ALKPHOS 85 02/18/2013 1554   BILITOT 0.3 07/16/2017 1442   BILITOT 0.33 02/18/2013 1554   BILIDIR 0.0 11/15/2011 0946      Component Value Date/Time   TSH 0.33 (L) 04/24/2017 1501   TSH 0.755 03/21/2017 1018   TSH 0.42 09/28/2016 1451    ASSESSMENT AND PLAN: Type 2 diabetes mellitus without complication, with long-term current use of insulin (HCC)  Essential hypertension  Class 2 severe obesity with serious comorbidity and body mass index (BMI) of 39.0 to 39.9 in adult,  unspecified obesity type ([place])  PLAN:  Diabetes II Stacey Roberts has been given extensive diabetes education by myself today including ideal fasting and post-prandial blood glucose readings, individual ideal Hgb A1c goals and hypoglycemia prevention. We discussed the importance of good blood sugar control to decrease the likelihood of diabetic complications such as nephropathy, neuropathy, limb loss, blindness, coronary artery disease, and death. We discussed the importance of intensive lifestyle modification including diet, exercise and weight loss as the first line treatment for diabetes. Stacey Roberts agrees to continue her diabetes current medications, will need to follow up on BGs at next appointment with stopping Trulicity. Stacey Roberts agrees to follow up with our clinic in 2 weeks.  Hypertension We discussed sodium restriction, working on healthy weight loss, and a regular exercise program as the means to achieve improved blood pressure control. Stacey Roberts agreed with this plan  and agreed to follow up as directed. We will continue to monitor her blood pressure as well as her progress with the above lifestyle modifications. Stacey Roberts agrees to continue taking losartan and she will watch for signs of hypotension as she continues her lifestyle modifications. Stacey Roberts agrees to follow up with our clinic in 2 weeks.  We spent > than 50% of the 15 minute visit on the counseling as documented in the note.  Obesity Stacey Roberts is currently in the action stage of change. As such, her goal is to continue with weight loss efforts She has agreed to follow the Category 2 plan Stacey Roberts has been instructed to work up to a goal of 150 minutes of combined cardio and strengthening exercise per week for weight loss and overall health benefits. Start to consider where to add resistance training in to schedule. We discussed the following Behavioral Modification Strategies today: increasing lean protein intake, increasing vegetables, work on meal planning and easy  cooking plans, and planning for success   Stacey Roberts has agreed to follow up with our clinic in 2 weeks. She was informed of the importance of frequent follow up visits to maximize her success with intensive lifestyle modifications for her multiple health conditions.   OBESITY BEHAVIORAL INTERVENTION VISIT  Today's visit was # 9 out of 22.  Starting weight: 247 lbs Starting date: 03/21/17 Today's weight : 228 lbs Today's date: 08/21/2017 Total lbs lost to date: 34    ASK: We discussed the diagnosis of obesity with Stacey Roberts today and Stacey Roberts agreed to give [place] permission to discuss obesity behavioral modification therapy today.  ASSESS: Stacey Roberts has the diagnosis of obesity and her BMI today is 39.12 Stacey Roberts is in the action stage of change   ADVISE: Stacey Roberts was educated on the multiple health risks of obesity as well as the benefit of weight loss to improve her health. She was advised of the need for long term treatment and the importance of lifestyle modifications.  AGREE: Multiple dietary modification options and treatment options were discussed and  Stacey Roberts agreed to the above obesity treatment plan.  I, Trixie Dredge, am acting as transcriptionist for Ilene Qua, MD  I have reviewed the above documentation for accuracy and completeness, and I agree with the above. - Ilene Qua, MD

## 2017-08-27 ENCOUNTER — Other Ambulatory Visit: Payer: Self-pay | Admitting: Internal Medicine

## 2017-08-27 MED FILL — ACCU-CHEK GUIDE TEST STRIP: 90 days supply | Qty: 100 | Fill #2

## 2017-08-28 MED FILL — PANTOPRAZOLE SOD DR 40 MG T: 40 | 90 days supply | Qty: 90 | Fill #0

## 2017-09-05 ENCOUNTER — Ambulatory Visit (INDEPENDENT_AMBULATORY_CARE_PROVIDER_SITE_OTHER): Payer: No Typology Code available for payment source | Admitting: Family Medicine

## 2017-09-05 VITALS — BP 134/79 | HR 98 | Temp 97.8°F | Ht 64.0 in | Wt 225.0 lb

## 2017-09-05 DIAGNOSIS — Z6838 Body mass index (BMI) 38.0-38.9, adult: Secondary | ICD-10-CM | POA: Diagnosis not present

## 2017-09-05 DIAGNOSIS — E119 Type 2 diabetes mellitus without complications: Secondary | ICD-10-CM

## 2017-09-05 DIAGNOSIS — E559 Vitamin D deficiency, unspecified: Secondary | ICD-10-CM

## 2017-09-05 DIAGNOSIS — Z794 Long term (current) use of insulin: Secondary | ICD-10-CM

## 2017-09-05 DIAGNOSIS — Z9189 Other specified personal risk factors, not elsewhere classified: Secondary | ICD-10-CM | POA: Diagnosis not present

## 2017-09-05 MED ORDER — VITAMIN D (ERGOCALCIFEROL) 1.25 MG (50000 UNIT) PO CAPS: 50000.0000 [IU] | ORAL_CAPSULE | ORAL | 0 refills | Status: DC

## 2017-09-05 MED FILL — VIT D2 1.25 MG (50,000 UNIT: 1.25 MG | 28 days supply | Qty: 4 | Fill #0

## 2017-09-05 NOTE — Progress Notes (Signed)
Office: [phone]  /  Fax: [phone]   HPI:   Chief Complaint: OBESITY Stacey Roberts is here to discuss her progress with her obesity treatment plan. She is on the Category 2 plan and is following her eating plan approximately 50 % of the time. She states she is walking 30 minutes 5 times per week. Stacey Roberts finds herself not going out to eat and thinks that is helping quite a bit. She has indulged in a few [place] bagels. Stacey Roberts denies hunger. Stacey Roberts is planning for a trip to [place] in three days. Her weight is 225 lb (102.1 kg) today and has had a weight loss of 3 pounds over a period of 2 weeks since her last visit. She has lost 22 lbs since starting treatment with [place].  Diabetes II Toinette has a diagnosis of diabetes type II. Pippa states blood sugars are running in the 120 to 130's and she denies any hypoglycemic episodes. [name] was at 7.9 She has been working on intensive lifestyle modifications including diet, exercise, and weight loss to help control her blood glucose levels. Gwendolynn sees her endocrinologist in October.  Vitamin D deficiency Stacey Roberts has a diagnosis of vitamin D deficiency. Boggess is currently taking vit D and fatigue is improving. She denies nausea, vomiting or muscle weakness.  At risk for osteopenia and osteoporosis Stacey Roberts is at higher risk of osteopenia and osteoporosis due to vitamin D deficiency.   ALLERGIES: Allergies  Allergen Reactions  . Penicillins Shortness Of Breath, Rash and Other (See Comments)    Has patient had a PCN reaction causing immediate rash, facial/tongue/throat swelling, SOB or lightheadedness with hypotension: Yes Has patient had a PCN reaction causing severe rash involving mucus membranes or skin necrosis: Yes Has patient had a PCN reaction that required hospitalization No Has patient had a PCN reaction occurring within the last 10 years: Yes If all of the above answers are "NO", then may proceed with Cephalosporin use.   . Clindamycin/Lincomycin  Other (See Comments)    Severe stomach cramps  . Ace Inhibitors Other (See Comments) and Cough    REACTION: cough; denies airway involvement     MEDICATIONS: Current Outpatient Medications on File Prior to Visit  Medication Sig Dispense Refill  . aspirin 81 MG chewable tablet Chew 81 mg by mouth daily.    . Cholecalciferol (EQL VITAMIN D3) 2000 units CAPS Take 2,000 Units daily by mouth.    . dapagliflozin propanediol (FARXIGA) 5 MG TABS tablet Take 5 mg by mouth daily. 90 tablet 3  . Dulaglutide (TRULICITY) 1.5 FY/1.0FB SOPN Inject 1.5 mg into the skin once a week. 4 pen 11  . gabapentin (NEURONTIN) 300 MG capsule Take 1 capsule (300 mg total) by mouth at bedtime. 90 capsule 4  . glucose blood (ACCU-CHEK GUIDE) test strip 1 each by Other route 2 (two) times daily. Used to check blood sugars 1x daily. 180 each 3  . Insulin Glargine (LANTUS SOLOSTAR) 100 UNIT/ML Solostar Pen Inject 60 Units into the skin every morning. And pen needles 1/day 30 pen 11  . levothyroxine (SYNTHROID, LEVOTHROID) 150 MCG tablet Take one tablet by mouth daily. 30 tablet 0  . levothyroxine (SYNTHROID, LEVOTHROID) 150 MCG tablet TAKE ONE TABLET BY MOUTH DAILY BEFORE BREAKFAST. 30 tablet 0  . losartan (COZAAR) 50 MG tablet TAKE 1 TABLET (50 MG TOTAL) BY MOUTH DAILY. (Patient taking differently: Take 25 mg by mouth daily. ) 30 tablet 1  . LYRICA 75 MG capsule TAKE 1 CAPSULE  BY MOUTH TWICE DAILY 60 capsule 4  . metFORMIN (GLUCOPHAGE) 1000 MG tablet Take 1 tablet (1,000 mg total) by mouth 2 (two) times daily with a meal. 180 tablet 3  . pantoprazole (PROTONIX) 40 MG tablet TAKE 1 TABLET (40 MG TOTAL) BY MOUTH DAILY. 90 tablet 0  . rosuvastatin (CRESTOR) 10 MG tablet Take 1 tablet (10 mg total) by mouth daily. 90 tablet 3  . tamoxifen (NOLVADEX) 20 MG tablet Take 20 mg daily by mouth.   10  . Vitamin D, Ergocalciferol, (DRISDOL) 50000 units CAPS capsule Take 1 capsule (50,000 Units total) by mouth every 7 (seven) days. 4  capsule 0  . [DISCONTINUED] Calcium Carbonate 1500 MG TABS Take 1,500 mg by mouth daily.       No current facility-administered medications on file prior to visit.     PAST MEDICAL HISTORY: Past Medical History:  Diagnosis Date  . Allergy    seasonal  . Breast cancer ([place]) 2014  . Breast cancer ([place]) 2017  . Bronchitis    hx of  . Diabetes mellitus, type II ([place])   . GERD (gastroesophageal reflux disease)   . Headache   . History of radiation therapy 12/01/15-01/11/16   Left breast DIBH / 50.4 Gy in 28 fractions and Left breast boost / 12 Gy in 6 fractions  . Hyperlipidemia   . Hypertension   . Hypothyroidism   . Joint pain   . Obesity   . PCOS (polycystic ovarian syndrome)   . Personal history of radiation therapy 2017  . Pneumonia    as a child  . Sleep apnea 2008   severe OSA-does not use a cpap  . Snoring   . 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 Buddy Duty    PAST SURGICAL HISTORY: Past Surgical History:  Procedure Laterality Date  . BREAST BIOPSY  2000   s/p  . BREAST BIOPSY Left 01/22/2013   Procedure: RE-EXCISION LEFT BREAST DCIS;  Surgeon: Harl Bowie, MD;  Location: [facility];  Service: General;  Laterality: Left;  . BREAST LUMPECTOMY Left 01/06/2013   Procedure: LUMPECTOMY;  Surgeon: Harl Bowie, MD;  Location: [facility];  Service: General;  Laterality: Left;  . BREAST LUMPECTOMY Left 2017  . BREAST LUMPECTOMY WITH NEEDLE LOCALIZATION AND AXILLARY SENTINEL LYMPH NODE BX Left 09/30/2015   Procedure: Left BREAST LUMPECTOMY WITH NEEDLE LOCALIZATION AND AXILLARY SENTINEL LYMPH NODE BX;  Surgeon: Coralie Keens, MD;  Location: [facility];  Service: General;  Laterality: Left;  . BREAST RECONSTRUCTION Bilateral 10/12/2015   Procedure: BREAST oncoplastic RECONSTRUCTION;  Surgeon: Irene Limbo, MD;  Location: [facility];   Service: Plastics;  Laterality: Bilateral;  . BREAST REDUCTION SURGERY Bilateral 10/12/2015   Procedure: MAMMARY REDUCTION  (BREAST);  Surgeon: Irene Limbo, MD;  Location: [facility];  Service: Plastics;  Laterality: Bilateral;  . DILATION AND CURETTAGE OF UTERUS  2004   s/p  . EXCISION OF BREAST LESION Left 10/12/2015   Procedure: Re-EXCISION OF BREAST;  Surgeon: Coralie Keens, MD;  Location: [facility];  Service: General;  Laterality: Left;  . REDUCTION MAMMAPLASTY Bilateral 09/2015  . THYROIDECTOMY  02/2009   Dr Harlow Asa  . TONSILLECTOMY  1982    SOCIAL HISTORY: Social History   Tobacco Use  . Smoking status: Former Smoker    Packs/day: 0.50    Types: Cigarettes    Last  attempt to quit: 09/29/2007    Years since quitting: 9.9  . Smokeless tobacco: Never Used  . Tobacco comment: Smoked on and off for 20 years. Quit about 8 years ago (as of 08/2015)  Substance Use Topics  . Alcohol use: Yes    Alcohol/week: 0.0 standard drinks    Comment: occasionally  . Drug use: No    FAMILY HISTORY: Family History  Problem Relation Age of Onset  . Dementia Father        deceased age 36 secondary to dementia  . Bipolar disorder Father   . Emphysema Father   . Heart disease Father   . COPD Father        smoker  . Hypertension Father   . Depression Father   . Alcoholism Father   . CAD Mother 92       Died age 57 of CAD  . Heart disease Mother   . Graves' disease Mother   . Hypertension Mother   . Thyroid disease Mother   . CAD Maternal Grandfather 60  . Heart disease Maternal Grandfather   . Other Sister 36       hysterectomy for fibroids  . Prostate cancer Brother        low grade; w/ surveillance  . Thyroid nodules Brother 41  . Fibroids Other        niece dx approx 73  . Hypertension Other   . Kidney cancer Cousin        maternal 1st cousin dx 73-47; former smoker  . Lung cancer Other        maternal great aunt (MGM's sister); not a  smoker  . Cancer Other        nephew dx neuroblastoma at 83.14 years old  . Colon cancer Neg Hx   . Colon polyps Neg Hx     ROS: Review of Systems  Constitutional: Positive for malaise/fatigue and weight loss.  Gastrointestinal: Negative for nausea and vomiting.  Musculoskeletal:       Negative for muscle weakness    PHYSICAL EXAM: Blood pressure 134/79, pulse 98, temperature 97.8 F (36.6 C), temperature source Oral, height 5\' 4"  (1.626 m), weight 225 lb (102.1 kg), SpO2 97 %. Body mass index is 38.62 kg/m. Physical Exam  Constitutional: She is oriented to person, place, and time. She appears well-developed and well-nourished.  Cardiovascular: Normal rate.  Pulmonary/Chest: Effort normal.  Musculoskeletal: Normal range of motion.  Neurological: She is oriented to person, place, and time.  Skin: Skin is warm and dry.  Psychiatric: She has a normal mood and affect. Her behavior is normal.  Vitals reviewed.   RECENT LABS AND TESTS: BMET    Component Value Date/Time   NA 142 07/16/2017 1442   NA 138 03/21/2017 1018   NA 137 02/18/2013 1554   K 4.2 07/16/2017 1442   K 4.4 02/18/2013 1554   CL 104 07/16/2017 1442   CO2 29 07/16/2017 1442   CO2 25 02/18/2013 1554   GLUCOSE 94 07/16/2017 1442   GLUCOSE 300 (H) 02/18/2013 1554   BUN 11 07/16/2017 1442   BUN 16 03/21/2017 1018   BUN 14.2 02/18/2013 1554   CREATININE 0.67 07/16/2017 1442   CREATININE 0.8 02/18/2013 1554   CALCIUM 9.3 07/16/2017 1442   CALCIUM 9.2 02/18/2013 1554   GFRNONAA >60 07/16/2017 1442   GFRAA >60 07/16/2017 1442   Lab Results  Component Value Date   HGBA1C 7.9 (A) 07/20/2017   HGBA1C 8.0 04/24/2017  HGBA1C 9.3 (H) 03/21/2017   HGBA1C 10.4 01/10/2017   HGBA1C 9.2 (H) 09/28/2016   Lab Results  Component Value Date   INSULIN 46.3 (H) 03/21/2017   CBC    Component Value Date/Time   WBC 10.3 07/16/2017 1442   WBC 6.2 09/28/2016 1451   RBC 4.65 07/16/2017 1442   HGB 13.0 07/16/2017 1442    HGB 13.5 03/21/2017 1018   HGB 12.5 02/18/2013 1554   HCT 39.4 07/16/2017 1442   HCT 42.0 03/21/2017 1018   HCT 39.6 02/18/2013 1554   PLT 274 07/16/2017 1442   PLT 306 02/18/2013 1554   MCV 84.9 07/16/2017 1442   MCV 86 03/21/2017 1018   MCV 82.6 02/18/2013 1554   MCH 27.9 07/16/2017 1442   MCHC 32.9 07/16/2017 1442   RDW 14.3 07/16/2017 1442   RDW 14.6 03/21/2017 1018   RDW 16.4 (H) 02/18/2013 1554   LYMPHSABS 2.2 07/16/2017 1442   LYMPHSABS 1.5 03/21/2017 1018   LYMPHSABS 2.0 02/18/2013 1554   MONOABS 0.5 07/16/2017 1442   MONOABS 0.6 02/18/2013 1554   EOSABS 0.1 07/16/2017 1442   EOSABS 0.1 03/21/2017 1018   BASOSABS 0.1 07/16/2017 1442   BASOSABS 0.0 03/21/2017 1018   BASOSABS 0.0 02/18/2013 1554   Iron/TIBC/Ferritin/ %Sat    Component Value Date/Time   IRON 43 09/28/2016 1451   IRONPCTSAT 12.4 (L) 09/28/2016 1451   Lipid Panel     Component Value Date/Time   CHOL 122 03/21/2017 1018   TRIG 90 03/21/2017 1018   HDL 41 03/21/2017 1018   CHOLHDL 4 09/28/2016 1451   VLDL 32.2 09/28/2016 1451   LDLCALC 63 03/21/2017 1018   LDLDIRECT 153.0 09/01/2014 1634   Hepatic Function Panel     Component Value Date/Time   PROT 6.9 07/16/2017 1442   PROT 6.5 03/21/2017 1018   PROT 7.2 02/18/2013 1554   ALBUMIN 3.6 07/16/2017 1442   ALBUMIN 3.7 03/21/2017 1018   ALBUMIN 3.6 02/18/2013 1554   AST 12 07/16/2017 1442   AST 10 02/18/2013 1554   ALT 12 07/16/2017 1442   ALT 16 02/18/2013 1554   ALKPHOS 66 07/16/2017 1442   ALKPHOS 85 02/18/2013 1554   BILITOT 0.3 07/16/2017 1442   BILITOT 0.33 02/18/2013 1554   BILIDIR 0.0 11/15/2011 0946      Component Value Date/Time   TSH 0.33 (L) 04/24/2017 1501   TSH 0.755 03/21/2017 1018   TSH 0.42 09/28/2016 1451   Results for ARIATNA, JESTER (MRN [id number]) as of 09/05/2017 07:59  Ref. Range 03/21/2017 10:18  Vitamin D, 25-Hydroxy Latest Ref Range: 30.0 - 100.0 ng/mL 35.9   ASSESSMENT AND PLAN: Type 2 diabetes mellitus  without complication, with long-term current use of insulin (HCC)  Vitamin D deficiency - Plan: Vitamin D, Ergocalciferol, (DRISDOL) 50000 units CAPS capsule  At risk for osteoporosis  Class 2 severe obesity with serious comorbidity and body mass index (BMI) of 38.0 to 38.9 in adult, unspecified obesity type ([facility])  PLAN:  Diabetes II Stacey Roberts has been given extensive diabetes education by myself today including ideal fasting and post-prandial blood glucose readings, individual ideal Hgb A1c goals and hypoglycemia prevention. We discussed the importance of good blood sugar control to decrease the likelihood of diabetic complications such as nephropathy, neuropathy, limb loss, blindness, coronary artery disease, and death. We discussed the importance of intensive lifestyle modification including diet, exercise and weight loss as the first line treatment for diabetes. Makyiah agrees to continue her diabetes medications; there are no  changes at this time. Jayna agrees to follow up at the agreed upon time.  Vitamin D Deficiency Stacey Roberts was informed that low vitamin D levels contributes to fatigue and are associated with obesity, breast, and colon cancer. She agrees to continue to take prescription Vit D @50 ,000 IU every week #4 with no refills and will follow up for routine testing of vitamin D, at least 2-3 times per year. She was informed of the risk of over-replacement of vitamin D and agrees to not increase her dose unless she discusses this with [place] first. We will check vitamin D level at the next visit and Tonia agrees to follow up as directed.  At risk for osteopenia and osteoporosis Stacey Roberts is at risk for osteopenia and osteoporosis due to her vitamin D deficiency. She was encouraged to take her vitamin D and follow her higher calcium diet and increase strengthening exercise to help strengthen her bones and decrease her risk of osteopenia and osteoporosis.  Obesity Stacey Roberts is currently in the action stage of  change. As such, her goal is to continue with weight loss efforts She has agreed to follow the Category 2 plan Stacey Roberts has been instructed to work up to a goal of 150 minutes of combined cardio and strengthening exercise per week for weight loss and overall health benefits. We discussed the following Behavioral Modification Strategies today: increasing lean protein intake, increasing vegetables, work on meal planning and easy cooking plans and travel eating strategies   Stacey Roberts has agreed to follow up with our clinic in 2 weeks. She was informed of the importance of frequent follow up visits to maximize her success with intensive lifestyle modifications for her multiple health conditions.   OBESITY BEHAVIORAL INTERVENTION VISIT  Today's visit was # 10 out of 22.  Starting weight: 247 lbs Starting date: 03/21/17 Today's weight : 225 lbs Today's date: 09/05/2017 Total lbs lost to date: 29    ASK: We discussed the diagnosis of obesity with Stacey Roberts today and Stacey Roberts agreed to give [place] permission to discuss obesity behavioral modification therapy today.  ASSESS: Stacey Roberts has the diagnosis of obesity and her BMI today is 38.6 Stacey Roberts is in the action stage of change   ADVISE: Stacey Roberts was educated on the multiple health risks of obesity as well as the benefit of weight loss to improve her health. She was advised of the need for long term treatment and the importance of lifestyle modifications.  AGREE: Multiple dietary modification options and treatment options were discussed and  Stacey Roberts agreed to the above obesity treatment plan.  I, Doreene Nest, am acting as transcriptionist for Eber Jones, MD  I have reviewed the above documentation for accuracy and completeness, and I agree with the above. - Ilene Qua, MD

## 2017-09-12 ENCOUNTER — Other Ambulatory Visit: Payer: Self-pay | Admitting: Endocrinology

## 2017-09-12 MED ORDER — LEVOTHYROXINE SODIUM 150 MCG PO TABS: ORAL_TABLET | ORAL | 0 refills | Status: DC

## 2017-09-12 MED FILL — LEVOTHYROXINE 150 MCG TAB: 150 | 30 days supply | Qty: 30 | Fill #0

## 2017-09-12 MED FILL — TRULICITY 1.5 MG/0.5 ML PEN: 1.5 | 28 days supply | Qty: 2 | Fill #0

## 2017-09-12 MED FILL — TAMOXIFEN CIT 20 MG TABLET: 20 | 90 days supply | Qty: 90 | Fill #0

## 2017-09-17 MED FILL — LANTUS SOLOSTAR 100 UNITS/M: 100 | 34 days supply | Qty: 30 | Fill #1

## 2017-09-19 ENCOUNTER — Encounter (INDEPENDENT_AMBULATORY_CARE_PROVIDER_SITE_OTHER): Payer: Self-pay | Admitting: Physician Assistant

## 2017-09-19 ENCOUNTER — Ambulatory Visit (INDEPENDENT_AMBULATORY_CARE_PROVIDER_SITE_OTHER): Payer: No Typology Code available for payment source | Admitting: Physician Assistant

## 2017-09-19 VITALS — BP 112/74 | HR 103 | Temp 98.1°F | Ht 64.0 in | Wt 228.0 lb

## 2017-09-19 DIAGNOSIS — Z794 Long term (current) use of insulin: Secondary | ICD-10-CM | POA: Diagnosis not present

## 2017-09-19 DIAGNOSIS — Z6838 Body mass index (BMI) 38.0-38.9, adult: Secondary | ICD-10-CM | POA: Diagnosis not present

## 2017-09-19 DIAGNOSIS — E119 Type 2 diabetes mellitus without complications: Secondary | ICD-10-CM | POA: Diagnosis not present

## 2017-09-20 NOTE — Progress Notes (Signed)
Office: [phone]  /  Fax: [phone]   HPI:   Chief Complaint: OBESITY Stacey Roberts is here to discuss her progress with her obesity treatment plan. She is on the Category 2 plan and is following her eating plan approximately 50 % of the time. She states she is walking for 15 minutes 5 times per week. Stacey Roberts reports struggling with the meal plan due to being on vacation in [place] with her family. She is ready to get back on track. She declines lab work and states that her primary care physician and Endocrinologist will do it in the next 2 weeks.  Her weight is 228 lb (103.4 kg) today and has gained 3 pounds since her last visit. She has lost 19 lbs since starting treatment with [place].  Diabetes II Stacey Roberts has a diagnosis of diabetes type II. Stacey Roberts states fasting BGs range between 100 and 180's. She is on metformin and insulin and she denies any hypoglycemic episodes. She sees her Endocrinologist next month. [name] was 7.9. She has been working on intensive lifestyle modifications including diet, exercise, and weight loss to help control her blood glucose levels.  ALLERGIES: Allergies  Allergen Reactions  . Penicillins Shortness Of Breath, Rash and Other (See Comments)    Has patient had a PCN reaction causing immediate rash, facial/tongue/throat swelling, SOB or lightheadedness with hypotension: Yes Has patient had a PCN reaction causing severe rash involving mucus membranes or skin necrosis: Yes Has patient had a PCN reaction that required hospitalization No Has patient had a PCN reaction occurring within the last 10 years: Yes If all of the above answers are "NO", then may proceed with Cephalosporin use.   . Clindamycin/Lincomycin Other (See Comments)    Severe stomach cramps  . Ace Inhibitors Other (See Comments) and Cough    REACTION: cough; denies airway involvement     MEDICATIONS: Current Outpatient Medications on File Prior to Visit  Medication Sig Dispense Refill  .  aspirin 81 MG chewable tablet Chew 81 mg by mouth daily.    . Cholecalciferol (EQL VITAMIN D3) 2000 units CAPS Take 2,000 Units daily by mouth.    . dapagliflozin propanediol (FARXIGA) 5 MG TABS tablet Take 5 mg by mouth daily. 90 tablet 3  . Dulaglutide (TRULICITY) 1.5 XB/2.8UX SOPN Inject 1.5 mg into the skin once a week. 4 pen 11  . gabapentin (NEURONTIN) 300 MG capsule Take 1 capsule (300 mg total) by mouth at bedtime. 90 capsule 4  . glucose blood (ACCU-CHEK GUIDE) test strip 1 each by Other route 2 (two) times daily. Used to check blood sugars 1x daily. 180 each 3  . Insulin Glargine (LANTUS SOLOSTAR) 100 UNIT/ML Solostar Pen Inject 60 Units into the skin every morning. And pen needles 1/day 30 pen 11  . levothyroxine (SYNTHROID, LEVOTHROID) 150 MCG tablet TAKE ONE TABLET BY MOUTH DAILY BEFORE BREAKFAST. 30 tablet 0  . levothyroxine (SYNTHROID, LEVOTHROID) 150 MCG tablet Take one tablet by mouth daily. 30 tablet 0  . losartan (COZAAR) 50 MG tablet TAKE 1 TABLET (50 MG TOTAL) BY MOUTH DAILY. (Patient taking differently: Take 25 mg by mouth daily. ) 30 tablet 1  . LYRICA 75 MG capsule TAKE 1 CAPSULE BY MOUTH TWICE DAILY 60 capsule 4  . metFORMIN (GLUCOPHAGE) 1000 MG tablet Take 1 tablet (1,000 mg total) by mouth 2 (two) times daily with a meal. 180 tablet 3  . pantoprazole (PROTONIX) 40 MG tablet TAKE 1 TABLET (40 MG TOTAL) BY MOUTH DAILY.  90 tablet 0  . rosuvastatin (CRESTOR) 10 MG tablet Take 1 tablet (10 mg total) by mouth daily. 90 tablet 3  . tamoxifen (NOLVADEX) 20 MG tablet Take 20 mg daily by mouth.   10  . Vitamin D, Ergocalciferol, (DRISDOL) 50000 units CAPS capsule Take 1 capsule (50,000 Units total) by mouth every 7 (seven) days. 4 capsule 0  . [DISCONTINUED] Calcium Carbonate 1500 MG TABS Take 1,500 mg by mouth daily.       No current facility-administered medications on file prior to visit.     PAST MEDICAL HISTORY: Past Medical History:  Diagnosis Date  . Allergy     seasonal  . Breast cancer ([facility]) 2014  . Breast cancer ([facility]) 2017  . Bronchitis    hx of  . Diabetes mellitus, type II ([facility])   . GERD (gastroesophageal reflux disease)   . Headache   . History of radiation therapy 12/01/15-01/11/16   Left breast DIBH / 50.4 Gy in 28 fractions and Left breast boost / 12 Gy in 6 fractions  . Hyperlipidemia   . Hypertension   . Hypothyroidism   . Joint pain   . Obesity   . PCOS (polycystic ovarian syndrome)   . Personal history of radiation therapy 2017  . Pneumonia    as a child  . Sleep apnea 2008   severe OSA-does not use a cpap  . Snoring   . 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 Buddy Duty    PAST SURGICAL HISTORY: Past Surgical History:  Procedure Laterality Date  . BREAST BIOPSY  2000   s/p  . BREAST BIOPSY Left 01/22/2013   Procedure: RE-EXCISION LEFT BREAST DCIS;  Surgeon: Harl Bowie, MD;  Location: [facility];  Service: General;  Laterality: Left;  . BREAST LUMPECTOMY Left 01/06/2013   Procedure: LUMPECTOMY;  Surgeon: Harl Bowie, MD;  Location: [facility];  Service: General;  Laterality: Left;  . BREAST LUMPECTOMY Left 2017  . BREAST LUMPECTOMY WITH NEEDLE LOCALIZATION AND AXILLARY SENTINEL LYMPH NODE BX Left 09/30/2015   Procedure: Left BREAST LUMPECTOMY WITH NEEDLE LOCALIZATION AND AXILLARY SENTINEL LYMPH NODE BX;  Surgeon: Coralie Keens, MD;  Location: [facility];  Service: General;  Laterality: Left;  . BREAST RECONSTRUCTION Bilateral 10/12/2015   Procedure: BREAST oncoplastic RECONSTRUCTION;  Surgeon: Irene Limbo, MD;  Location: [facility];  Service: Plastics;  Laterality: Bilateral;  . BREAST REDUCTION SURGERY Bilateral 10/12/2015   Procedure: MAMMARY REDUCTION  (BREAST);  Surgeon: Irene Limbo, MD;  Location: [facility];  Service: Plastics;  Laterality: Bilateral;  .  DILATION AND CURETTAGE OF UTERUS  2004   s/p  . EXCISION OF BREAST LESION Left 10/12/2015   Procedure: Re-EXCISION OF BREAST;  Surgeon: Coralie Keens, MD;  Location: [facility];  Service: General;  Laterality: Left;  . REDUCTION MAMMAPLASTY Bilateral 09/2015  . THYROIDECTOMY  02/2009   Dr Harlow Asa  . TONSILLECTOMY  1982    SOCIAL HISTORY: Social History   Tobacco Use  . Smoking status: Former Smoker    Packs/day: 0.50    Types: Cigarettes    Last attempt to quit: 09/29/2007    Years since quitting: 9.9  . Smokeless tobacco: Never Used  . Tobacco comment: Smoked on and off for 20 years. Quit about 8 years ago (as of 08/2015)  Substance Use Topics  . Alcohol use: Yes  Alcohol/week: 0.0 standard drinks    Comment: occasionally  . Drug use: No    FAMILY HISTORY: Family History  Problem Relation Age of Onset  . Dementia Father        deceased age 69 secondary to dementia  . Bipolar disorder Father   . Emphysema Father   . Heart disease Father   . COPD Father        smoker  . Hypertension Father   . Depression Father   . Alcoholism Father   . CAD Mother 56       Died age 43 of CAD  . Heart disease Mother   . Graves' disease Mother   . Hypertension Mother   . Thyroid disease Mother   . CAD Maternal Grandfather 60  . Heart disease Maternal Grandfather   . Other Sister 80       hysterectomy for fibroids  . Prostate cancer Brother        low grade; w/ surveillance  . Thyroid nodules Brother 62  . Fibroids Other        niece dx approx 38  . Hypertension Other   . Kidney cancer Cousin        maternal 1st cousin dx 68-47; former smoker  . Lung cancer Other        maternal great aunt (MGM's sister); not a smoker  . Cancer Other        nephew dx neuroblastoma at 74.50 years old  . Colon cancer Neg Hx   . Colon polyps Neg Hx     ROS: Review of Systems  Constitutional: Negative for weight loss.  Endo/Heme/Allergies:       Negative hypoglycemia     PHYSICAL EXAM: Blood pressure 112/74, pulse (!) 103, temperature 98.1 F (36.7 C), temperature source Oral, height 5\' 4"  (1.626 m), weight 228 lb (103.4 kg), SpO2 97 %. Body mass index is 39.14 kg/m. Physical Exam  Constitutional: She is oriented to person, place, and time. She appears well-developed and well-nourished.  Cardiovascular: Normal rate.  Pulmonary/Chest: Effort normal.  Musculoskeletal: Normal range of motion.  Neurological: She is oriented to person, place, and time.  Skin: Skin is warm and dry.  Psychiatric: She has a normal mood and affect. Her behavior is normal.  Vitals reviewed.   RECENT LABS AND TESTS: BMET    Component Value Date/Time   NA 142 07/16/2017 1442   NA 138 03/21/2017 1018   NA 137 02/18/2013 1554   K 4.2 07/16/2017 1442   K 4.4 02/18/2013 1554   CL 104 07/16/2017 1442   CO2 29 07/16/2017 1442   CO2 25 02/18/2013 1554   GLUCOSE 94 07/16/2017 1442   GLUCOSE 300 (H) 02/18/2013 1554   BUN 11 07/16/2017 1442   BUN 16 03/21/2017 1018   BUN 14.2 02/18/2013 1554   CREATININE 0.67 07/16/2017 1442   CREATININE 0.8 02/18/2013 1554   CALCIUM 9.3 07/16/2017 1442   CALCIUM 9.2 02/18/2013 1554   GFRNONAA >60 07/16/2017 1442   GFRAA >60 07/16/2017 1442   Lab Results  Component Value Date   HGBA1C 7.9 (A) 07/20/2017   HGBA1C 8.0 04/24/2017   HGBA1C 9.3 (H) 03/21/2017   HGBA1C 10.4 01/10/2017   HGBA1C 9.2 (H) 09/28/2016   Lab Results  Component Value Date   INSULIN 46.3 (H) 03/21/2017   CBC    Component Value Date/Time   WBC 10.3 07/16/2017 1442   WBC 6.2 09/28/2016 1451   RBC 4.65 07/16/2017 1442  HGB 13.0 07/16/2017 1442   HGB 13.5 03/21/2017 1018   HGB 12.5 02/18/2013 1554   HCT 39.4 07/16/2017 1442   HCT 42.0 03/21/2017 1018   HCT 39.6 02/18/2013 1554   PLT 274 07/16/2017 1442   PLT 306 02/18/2013 1554   MCV 84.9 07/16/2017 1442   MCV 86 03/21/2017 1018   MCV 82.6 02/18/2013 1554   MCH 27.9 07/16/2017 1442   MCHC 32.9  07/16/2017 1442   RDW 14.3 07/16/2017 1442   RDW 14.6 03/21/2017 1018   RDW 16.4 (H) 02/18/2013 1554   LYMPHSABS 2.2 07/16/2017 1442   LYMPHSABS 1.5 03/21/2017 1018   LYMPHSABS 2.0 02/18/2013 1554   MONOABS 0.5 07/16/2017 1442   MONOABS 0.6 02/18/2013 1554   EOSABS 0.1 07/16/2017 1442   EOSABS 0.1 03/21/2017 1018   BASOSABS 0.1 07/16/2017 1442   BASOSABS 0.0 03/21/2017 1018   BASOSABS 0.0 02/18/2013 1554   Iron/TIBC/Ferritin/ %Sat    Component Value Date/Time   IRON 43 09/28/2016 1451   IRONPCTSAT 12.4 (L) 09/28/2016 1451   Lipid Panel     Component Value Date/Time   CHOL 122 03/21/2017 1018   TRIG 90 03/21/2017 1018   HDL 41 03/21/2017 1018   CHOLHDL 4 09/28/2016 1451   VLDL 32.2 09/28/2016 1451   LDLCALC 63 03/21/2017 1018   LDLDIRECT 153.0 09/01/2014 1634   Hepatic Function Panel     Component Value Date/Time   PROT 6.9 07/16/2017 1442   PROT 6.5 03/21/2017 1018   PROT 7.2 02/18/2013 1554   ALBUMIN 3.6 07/16/2017 1442   ALBUMIN 3.7 03/21/2017 1018   ALBUMIN 3.6 02/18/2013 1554   AST 12 07/16/2017 1442   AST 10 02/18/2013 1554   ALT 12 07/16/2017 1442   ALT 16 02/18/2013 1554   ALKPHOS 66 07/16/2017 1442   ALKPHOS 85 02/18/2013 1554   BILITOT 0.3 07/16/2017 1442   BILITOT 0.33 02/18/2013 1554   BILIDIR 0.0 11/15/2011 0946      Component Value Date/Time   TSH 0.33 (L) 04/24/2017 1501   TSH 0.755 03/21/2017 1018   TSH 0.42 09/28/2016 1451    ASSESSMENT AND PLAN: Type 2 diabetes mellitus without complication, with long-term current use of insulin (HCC)  Class 2 severe obesity with serious comorbidity and body mass index (BMI) of 38.0 to 38.9 in adult, unspecified obesity type ([place])  PLAN:  Diabetes II Stacey Roberts has been given extensive diabetes education by myself today including ideal fasting and post-prandial blood glucose readings, individual ideal Hgb A1c goals and hypoglycemia prevention. We discussed the importance of good blood sugar control to  decrease the likelihood of diabetic complications such as nephropathy, neuropathy, limb loss, blindness, coronary artery disease, and death. We discussed the importance of intensive lifestyle modification including diet, exercise and weight loss as the first line treatment for diabetes. Stacey Roberts agrees to continue her diabetes medications, diet, exercise, and weight loss, and she agrees to follow up with our clinic in 2 to 3 weeks.  I spent > than 50% of the 15 minute visit on counseling as documented in the note.  Obesity Stacey Roberts is currently in the action stage of change. As such, her goal is to continue with weight loss efforts She has agreed to follow the Category 2 plan Stacey Roberts has been instructed to work up to a goal of 150 minutes of combined cardio and strengthening exercise per week for weight loss and overall health benefits. We discussed the following Behavioral Modification Strategies today: increasing lean protein intake and  no skipping meals   Stacey Roberts has agreed to follow up with our clinic in 2 to 3 weeks. She was informed of the importance of frequent follow up visits to maximize her success with intensive lifestyle modifications for her multiple health conditions.   OBESITY BEHAVIORAL INTERVENTION VISIT  Today's visit was # 11.  Starting weight: 247 lbs Starting date: 03/21/17 Today's weight : 228 lbs  Today's date: 09/19/2017 Total lbs lost to date: 76    ASK: We discussed the diagnosis of obesity with Stacey Roberts today and Stacey Roberts agreed to give [place] permission to discuss obesity behavioral modification therapy today.  ASSESS: Stacey Roberts has the diagnosis of obesity and her BMI today is 39.12 Stacey Roberts is in the action stage of change   ADVISE: Stacey Roberts was educated on the multiple health risks of obesity as well as the benefit of weight loss to improve her health. She was advised of the need for long term treatment and the importance of lifestyle modifications.  AGREE: Multiple dietary  modification options and treatment options were discussed and  Stacey Roberts agreed to the above obesity treatment plan.  Stacey Roberts, am acting as transcriptionist for Abby Potash, PA-C I, Abby Potash, PA-C have reviewed above note and agree with its content

## 2017-09-25 ENCOUNTER — Encounter: Payer: Self-pay | Admitting: Endocrinology

## 2017-09-25 ENCOUNTER — Ambulatory Visit: Payer: No Typology Code available for payment source | Admitting: Endocrinology

## 2017-09-25 VITALS — BP 132/76 | HR 99 | Ht 64.0 in | Wt 231.0 lb

## 2017-09-25 DIAGNOSIS — E1142 Type 2 diabetes mellitus with diabetic polyneuropathy: Secondary | ICD-10-CM | POA: Diagnosis not present

## 2017-09-25 DIAGNOSIS — Z794 Long term (current) use of insulin: Secondary | ICD-10-CM | POA: Diagnosis not present

## 2017-09-25 LAB — POCT GLYCOSYLATED HEMOGLOBIN (HGB A1C): Hemoglobin A1C: 7.9 % — AB (ref 4.0–5.6)

## 2017-09-25 NOTE — Progress Notes (Signed)
Subjective:    Patient ID: Stacey Roberts, female    DOB: 02-Feb-1965, 52 y.o.   MRN: [id number]  HPI Pt has stage-1 papillary adenocarcinoma of the thyroid.    2/11: thyroidectomy: T1b N0 M0.  4/11: RAI 103 mCi, with thyrogen.  12/11: neck US: single enlarged right cervical lymph node, nonspecific.   6/12: body scan (thyrogen) neg.   12/12: [place]: lymph node is stable.  6/13: [place]: overall node morphology and volume show very little change.   9/14  TG undetectable (ab neg) 3/15: TG undetectable (ab neg) 10/15 TG undetectable (ab neg).  3/17 TG undetectable (ab neg).   4/19 TG undetectable (ab neg).    Pt returns for f/u of diabetes mellitus: DM type: Insulin-requiring type 2 Dx'ed: 2751 Complications: painful neuropathy of the lower extremities. Therapy: insulin since early 7001 (also trulicity and 2 oral meds).   GDM: never.  DKA: never.   Severe hypoglycemia: never.   Pancreatitis: never.  Other: she declines multiple daily injections; edema precudes pioglitizone rx.  She declines bariatric surgery for now.   Interval history: She never misses the insulin.  no cbg record, but states cbg's are well-controlled.  She has nausea from trulicity.   Past Medical History:  Diagnosis Date  . Allergy    seasonal  . Breast cancer ([facility]) 2014  . Breast cancer ([facility]) 2017  . Bronchitis    hx of  . Diabetes mellitus, type II ([facility])   . GERD (gastroesophageal reflux disease)   . Headache   . History of radiation therapy 12/01/15-01/11/16   Left breast DIBH / 50.4 Gy in 28 fractions and Left breast boost / 12 Gy in 6 fractions  . Hyperlipidemia   . Hypertension   . Hypothyroidism   . Joint pain   . Obesity   . PCOS (polycystic ovarian syndrome)   . Personal history of radiation therapy 2017  . Pneumonia    as a child  . Sleep apnea 2008   severe OSA-does not use a cpap  . Snoring   . 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 Buddy Duty    Past Surgical History:  Procedure Laterality Date  . BREAST BIOPSY  2000   s/p  . BREAST BIOPSY Left 01/22/2013   Procedure: RE-EXCISION LEFT BREAST DCIS;  Surgeon: Harl Bowie, MD;  Location: [facility];  Service: General;  Laterality: Left;  . BREAST LUMPECTOMY Left 01/06/2013   Procedure: LUMPECTOMY;  Surgeon: Harl Bowie, MD;  Location: [facility];  Service: General;  Laterality: Left;  . BREAST LUMPECTOMY Left 2017  . BREAST LUMPECTOMY WITH NEEDLE LOCALIZATION AND AXILLARY SENTINEL LYMPH NODE BX Left 09/30/2015   Procedure: Left BREAST LUMPECTOMY WITH NEEDLE LOCALIZATION AND AXILLARY SENTINEL LYMPH NODE BX;  Surgeon: Coralie Keens, MD;  Location: [facility];  Service: General;  Laterality: Left;  . BREAST RECONSTRUCTION Bilateral 10/12/2015   Procedure: BREAST oncoplastic RECONSTRUCTION;  Surgeon: Irene Limbo, MD;  Location: [facility];  Service: Plastics;  Laterality: Bilateral;  . BREAST REDUCTION SURGERY Bilateral 10/12/2015   Procedure: MAMMARY REDUCTION  (BREAST);  Surgeon: Irene Limbo, MD;  Location: [facility];  Service: Plastics;  Laterality: Bilateral;  . DILATION AND CURETTAGE OF UTERUS  2004   s/p  . EXCISION OF BREAST LESION Left 10/12/2015   Procedure: Re-EXCISION OF BREAST;  Surgeon: Coralie Keens, MD;  Location: [facility];  Service: General;  Laterality: Left;  . REDUCTION MAMMAPLASTY Bilateral 09/2015  . THYROIDECTOMY  02/2009   Dr Harlow Asa  . TONSILLECTOMY  1982    Social History   Socioeconomic History  . Marital status: Single    Spouse name: Not on file  . Number of children: Not on file  . Years of education: Not on file  . Highest education level: Not on file  Occupational History    Employer: [employer]    Comment: Outpatient scheduling in radiology  Social Needs  . Financial resource strain: Not on file  . Food  insecurity:    Worry: Not on file    Inability: Not on file  . Transportation needs:    Medical: Not on file    Non-medical: Not on file  Tobacco Use  . Smoking status: Former Smoker    Packs/day: 0.50    Types: Cigarettes    Last attempt to quit: 09/29/2007    Years since quitting: 9.9  . Smokeless tobacco: Never Used  . Tobacco comment: Smoked on and off for 20 years. Quit about 8 years ago (as of 08/2015)  Substance and Sexual Activity  . Alcohol use: Yes    Alcohol/week: 0.0 standard drinks    Comment: occasionally  . Drug use: No  . Sexual activity: Yes  Lifestyle  . Physical activity:    Days per week: Not on file    Minutes per session: Not on file  . Stress: Not on file  Relationships  . Social connections:    Talks on phone: Not on file    Gets together: Not on file    Attends religious service: Not on file    Active member of club or organization: Not on file    Attends meetings of clubs or organizations: Not on file    Relationship status: Not on file  . Intimate partner violence:    Fear of current or ex partner: Not on file    Emotionally abused: Not on file    Physically abused: Not on file    Forced sexual activity: Not on file  Other Topics Concern  . Not on file  Social History Narrative   The patient is divorced, moved to [place] in   2006 from [place].  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    No alcohol.  Tobacco use:  She quit 5 years ago.  She smoked on   and off for approximately 20 years.  No history of recreational drug   Use.Drinks two cups of coffee per work day.     Current Outpatient Medications on File Prior to Visit  Medication Sig Dispense Refill  . aspirin 81 MG chewable tablet Chew 81 mg by mouth daily.    . Cholecalciferol (EQL VITAMIN D3) 2000 units CAPS Take 2,000 Units daily by mouth.    . dapagliflozin propanediol (FARXIGA) 5 MG  TABS tablet Take 5 mg by mouth daily. 90 tablet 3  . Dulaglutide (TRULICITY) 1.5 KG/8.1EH SOPN Inject 1.5 mg into the skin once a week. 4 pen 11  . gabapentin (NEURONTIN) 300 MG capsule Take 1 capsule (300 mg total) by mouth at bedtime. 90 capsule 4  . glucose blood (ACCU-CHEK GUIDE) test strip 1 each by Other route 2 (two) times daily. Used to check blood sugars 1x daily. 180 each 3  . Insulin  Glargine (LANTUS SOLOSTAR) 100 UNIT/ML Solostar Pen Inject 60 Units into the skin every morning. And pen needles 1/day 30 pen 11  . levothyroxine (SYNTHROID, LEVOTHROID) 150 MCG tablet TAKE ONE TABLET BY MOUTH DAILY BEFORE BREAKFAST. 30 tablet 0  . losartan (COZAAR) 50 MG tablet TAKE 1 TABLET (50 MG TOTAL) BY MOUTH DAILY. (Patient taking differently: Take 25 mg by mouth daily. ) 30 tablet 1  . LYRICA 75 MG capsule TAKE 1 CAPSULE BY MOUTH TWICE DAILY 60 capsule 4  . metFORMIN (GLUCOPHAGE) 1000 MG tablet Take 1 tablet (1,000 mg total) by mouth 2 (two) times daily with a meal. 180 tablet 3  . pantoprazole (PROTONIX) 40 MG tablet TAKE 1 TABLET (40 MG TOTAL) BY MOUTH DAILY. 90 tablet 0  . rosuvastatin (CRESTOR) 10 MG tablet Take 1 tablet (10 mg total) by mouth daily. 90 tablet 3  . tamoxifen (NOLVADEX) 20 MG tablet Take 20 mg daily by mouth.   10  . Vitamin D, Ergocalciferol, (DRISDOL) 50000 units CAPS capsule Take 1 capsule (50,000 Units total) by mouth every 7 (seven) days. 4 capsule 0  . [DISCONTINUED] Calcium Carbonate 1500 MG TABS Take 1,500 mg by mouth daily.       No current facility-administered medications on file prior to visit.     Allergies  Allergen Reactions  . Penicillins Shortness Of Breath, Rash and Other (See Comments)    Has patient had a PCN reaction causing immediate rash, facial/tongue/throat swelling, SOB or lightheadedness with hypotension: Yes Has patient had a PCN reaction causing severe rash involving mucus membranes or skin necrosis: Yes Has patient had a PCN reaction that  required hospitalization No Has patient had a PCN reaction occurring within the last 10 years: Yes If all of the above answers are "NO", then may proceed with Cephalosporin use.   . Clindamycin/Lincomycin Other (See Comments)    Severe stomach cramps  . Ace Inhibitors Other (See Comments) and Cough    REACTION: cough; denies airway involvement     Family History  Problem Relation Age of Onset  . Dementia Father        deceased age 36 secondary to dementia  . Bipolar disorder Father   . Emphysema Father   . Heart disease Father   . COPD Father        smoker  . Hypertension Father   . Depression Father   . Alcoholism Father   . CAD Mother 68       Died age 55 of CAD  . Heart disease Mother   . Graves' disease Mother   . Hypertension Mother   . Thyroid disease Mother   . CAD Maternal Grandfather 60  . Heart disease Maternal Grandfather   . Other Sister 35       hysterectomy for fibroids  . Prostate cancer Brother        low grade; w/ surveillance  . Thyroid nodules Brother 39  . Fibroids Other        niece dx approx 63  . Hypertension Other   . Kidney cancer Cousin        maternal 1st cousin dx 24-47; former smoker  . Lung cancer Other        maternal great aunt (MGM's sister); not a smoker  . Cancer Other        nephew dx neuroblastoma at 28.96 years old  . Colon cancer Neg Hx   . Colon polyps Neg Hx     BP 132/76 (BP  Location: Right Arm)   Pulse 99   Ht 5\' 4"  (1.626 m)   Wt 231 lb (104.8 kg)   SpO2 95%   BMI 39.65 kg/m    Review of Systems She denies hypoglycemia.      Objective:   Physical Exam VITAL SIGNS:  See vs page GENERAL: no distress Pulses: foot pulses are intact bilaterally.   MSK: no deformity of the feet or ankles.  CV: trace bilat edema of the legs Skin:  no ulcer on the feet or ankles.  normal color and temp on the feet and ankles Neuro: sensation is intact to touch on the feet and ankles.     Lab Results  Component Value Date    HGBA1C 7.9 (A) 09/25/2017       Assessment & Plan:  Insulin-requiring type 2 DM, with polyneuropathy: this is the best control this pt should aim for, given this regimen, which does match insulin to her changing needs throughout the day Nausea, worse: we discussed meds.  she declines to change metformin to -XR.   Patient Instructions  Please continue the same medications.   Please call if you want to try reducing the trulicity, or to change to Ozmepic.   check your blood sugar twice a day.  vary the time of day when you check, between before the 3 meals, and at bedtime.  also check if you have symptoms of your blood sugar being too high or too low.  please keep a record of the readings and bring it to your next appointment here (or you can bring the meter itself).  You can write it on any piece of paper.  please call us sooner if your blood sugar goes below 70, or if you have a lot of readings over 200.   Please come back for a follow-up appointment in 3 months.

## 2017-09-25 NOTE — Patient Instructions (Addendum)
Please continue the same medications.   Please call if you want to try reducing the trulicity, or to change to Ozmepic.   check your blood sugar twice a day.  vary the time of day when you check, between before the 3 meals, and at bedtime.  also check if you have symptoms of your blood sugar being too high or too low.  please keep a record of the readings and bring it to your next appointment here (or you can bring the meter itself).  You can write it on any piece of paper.  please call us sooner if your blood sugar goes below 70, or if you have a lot of readings over 200.   Please come back for a follow-up appointment in 3 months.

## 2017-10-02 ENCOUNTER — Encounter: Payer: No Typology Code available for payment source | Admitting: Internal Medicine

## 2017-10-04 ENCOUNTER — Other Ambulatory Visit: Payer: Self-pay | Admitting: Internal Medicine

## 2017-10-04 MED FILL — LOSARTAN POTASSIUM 50 MG TA: 50 | 30 days supply | Qty: 30 | Fill #0

## 2017-10-08 ENCOUNTER — Other Ambulatory Visit: Payer: Self-pay | Admitting: Endocrinology

## 2017-10-08 IMAGING — RF DG UGI W/ KUB
10 of 12 series · 13 of 18 positions shown · non-contrast
Comparison: None.

CLINICAL DATA: Preop gastric sleeve gastrectomy.  Morbid obesity

EXAM:
UPPER GI SERIES WITH KUB
TECHNIQUE: After obtaining a scout radiograph a routine upper GI series was
performed using thin barium
FLUOROSCOPY TIME:  Fluoroscopy Time:  2 minutes 30 second
Radiation Exposure Index (if provided by the fluoroscopic device):
Number of Acquired Spot Images: 0

[Series 1: t abdomen supine · 0.15mm/px · 1 of 1 slices shown]
[im 1/1]
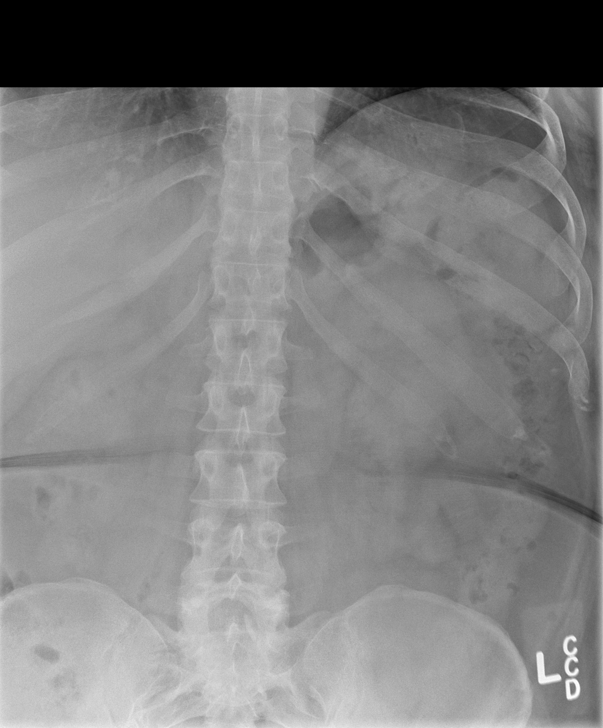

[Series 2: cp_standard · 0.38mm/px · 3 of 63 frames shown (1 of 5)]
[frame 10/63]
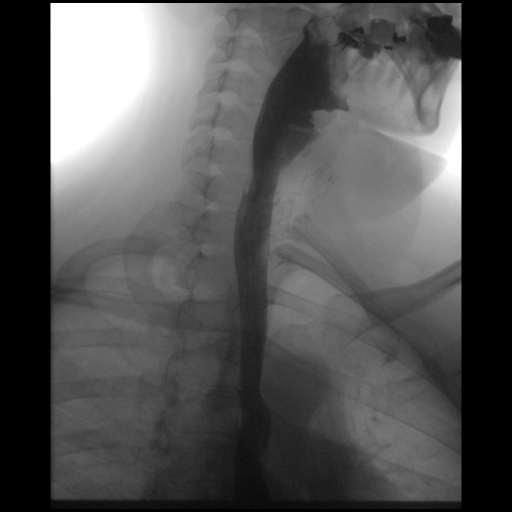
[frame 32/63]
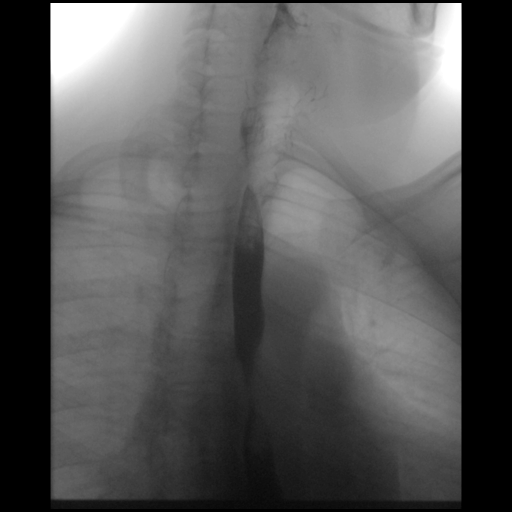
[frame 54/63]
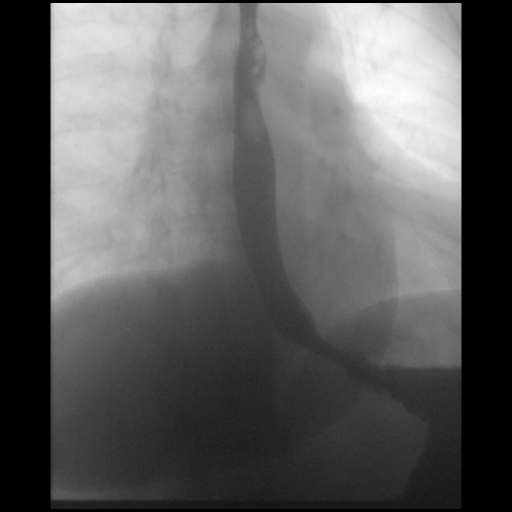

[Series 3: cp_standard · 0.37mm/px · 2 of 64 frames shown (2 of 5)]
[frame 33/64]
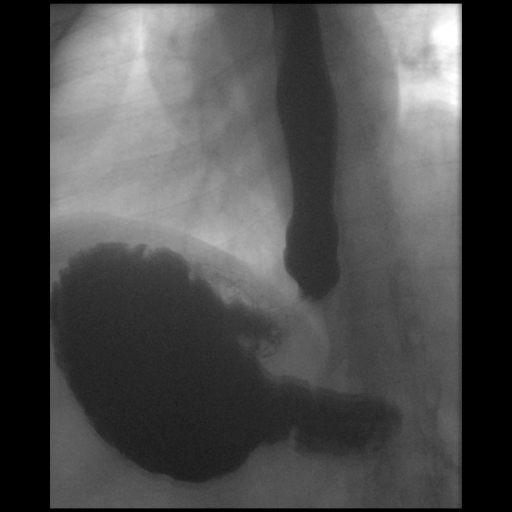
[frame 47/64]
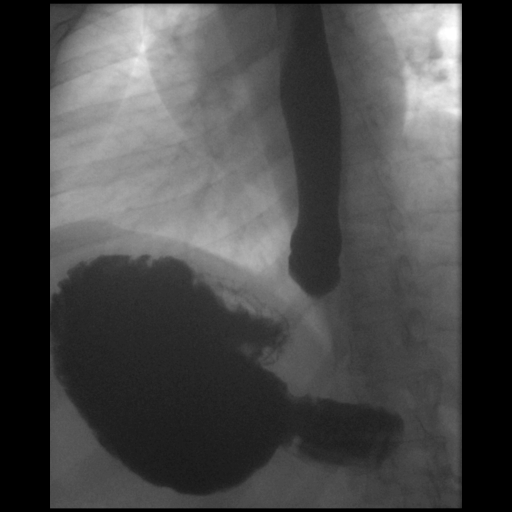

[Series 4: fluoro_barium 2fps_bw · 0.19mm/px · 1 of 1 slices shown (1 of 4)]
[im 1/1]
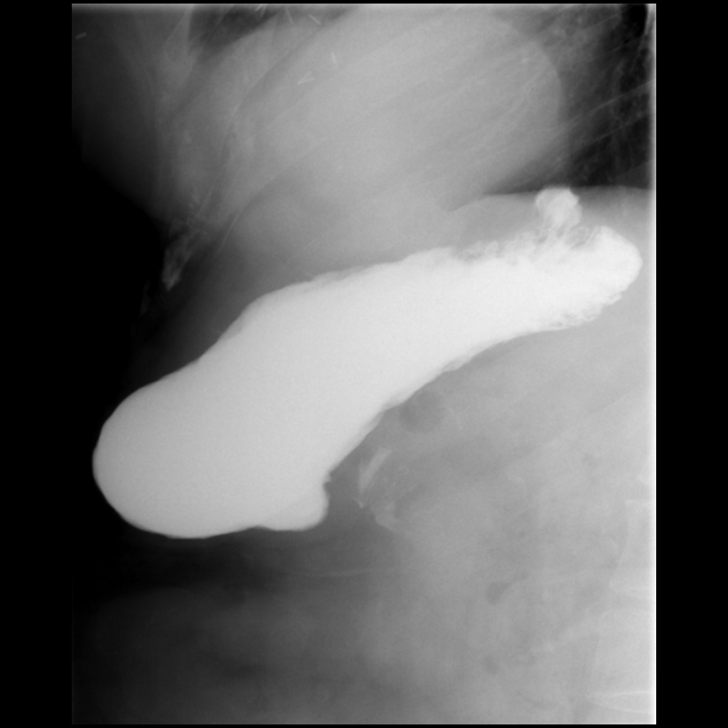

[Series 5: fluoro_barium 2fps_bw · 0.19mm/px · 1 of 1 slices shown (2 of 4)]
[im 1/1]
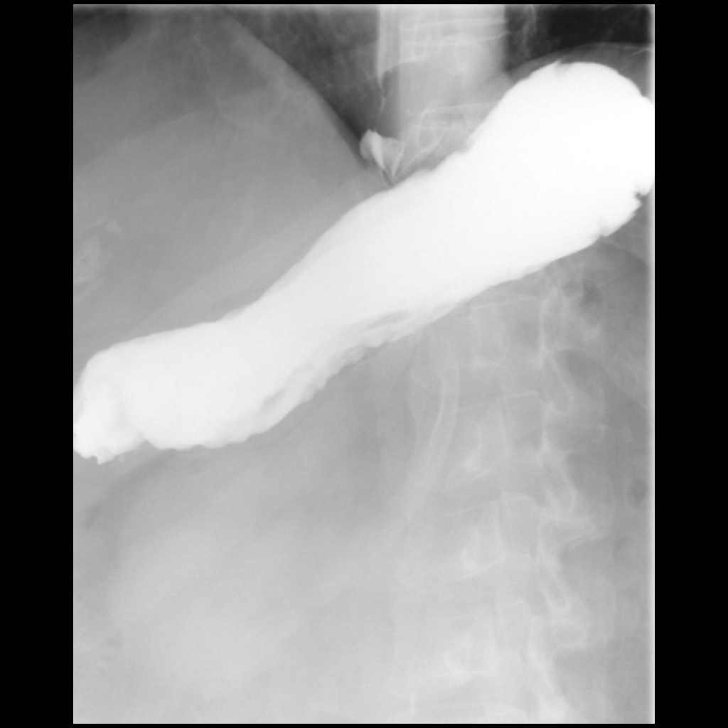

[Series 6: fluoro_barium 2fps_bw · 0.19mm/px · 1 of 1 slices shown (3 of 4)]
[im 1/1]
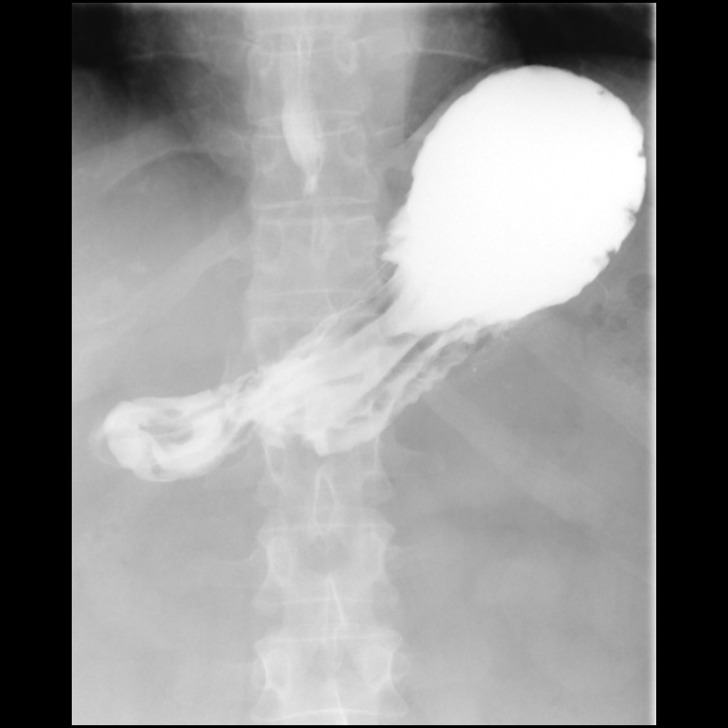

[Series 8: cp_standard · 0.20mm/px · 1 of 1 slices shown (3 of 5)]
[im 1/1]
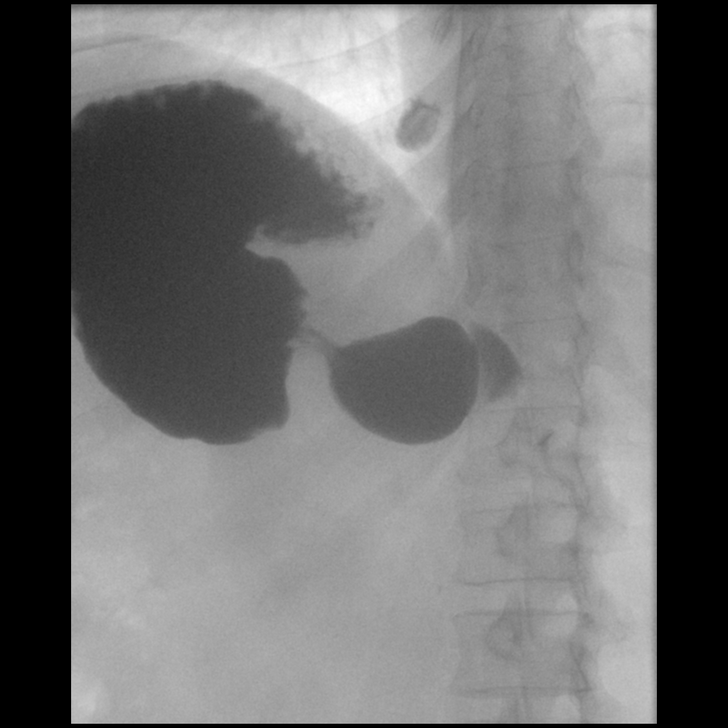

[Series 9: cp_standard · 0.20mm/px · 1 of 1 slices shown (4 of 5)]
[im 1/1]
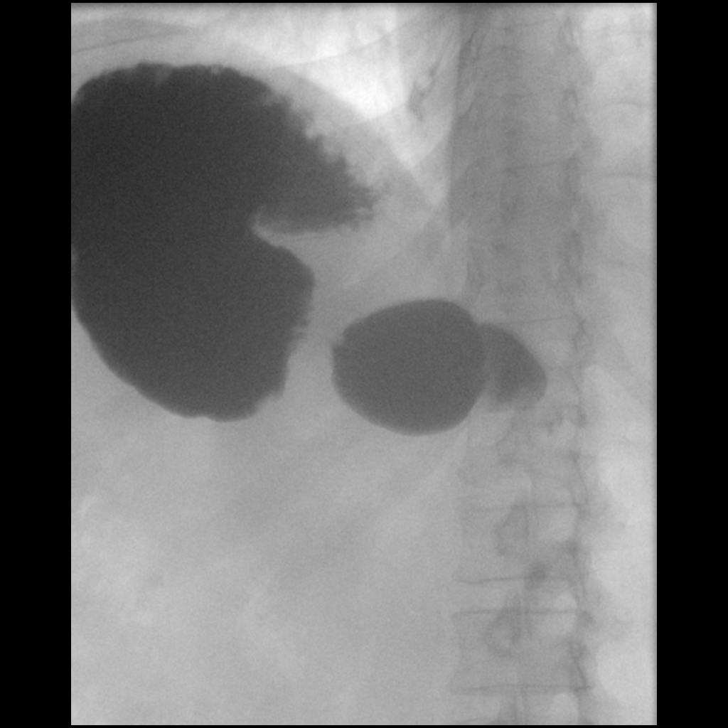

[Series 10: cp_standard · 0.20mm/px · 1 of 1 slices shown (5 of 5)]
[im 1/1]
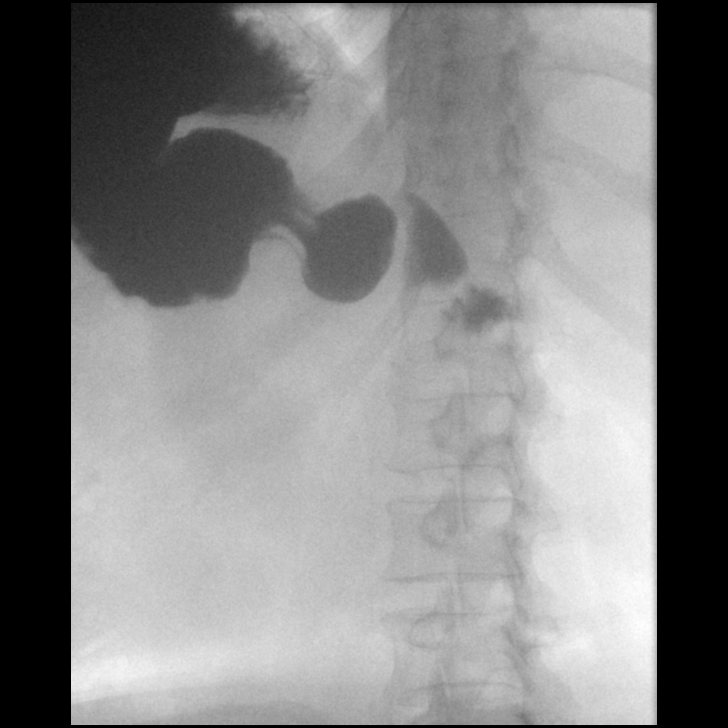

[Series 12: fluoro_barium 2fps_bw · 0.20mm/px · 1 of 1 slices shown (4 of 4)]
[im 1/1]
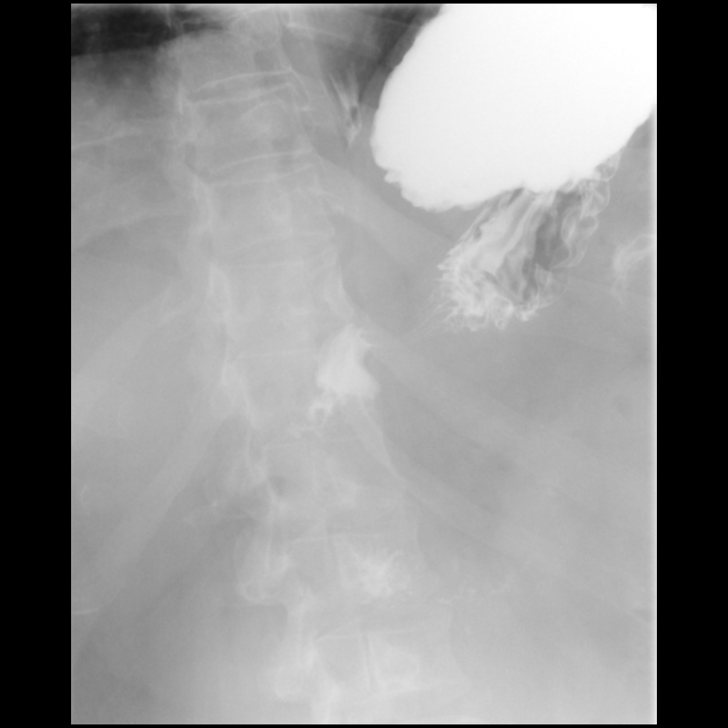

[13 of 18 positions shown; findings below may reference images not displayed]

FINDINGS: Esophageal mucosa and motility normal. Small hiatal hernia without
reflux

Gastric mucosa normal. No ulcer or mass. Stomach empties into the
duodenum normally. Duodenum bulb normal without ulceration or mass.
IMPRESSION: Small hiatal hernia without reflux.  Otherwise upper GI

## 2017-10-08 MED FILL — LEVOTHYROXINE 150 MCG TAB: 150 | 30 days supply | Qty: 30 | Fill #0

## 2017-10-09 ENCOUNTER — Ambulatory Visit (INDEPENDENT_AMBULATORY_CARE_PROVIDER_SITE_OTHER): Payer: Self-pay | Admitting: Physician Assistant

## 2017-10-11 MED FILL — GABAPENTIN 300 MG CAPSULE: 300 | 90 days supply | Qty: 90 | Fill #1

## 2017-10-16 MED FILL — FARXIGA 5 MG TABLET: 5 | 90 days supply | Qty: 90 | Fill #1

## 2017-10-18 ENCOUNTER — Other Ambulatory Visit: Payer: Self-pay | Admitting: Obstetrics and Gynecology

## 2017-10-18 ENCOUNTER — Ambulatory Visit (INDEPENDENT_AMBULATORY_CARE_PROVIDER_SITE_OTHER): Payer: Self-pay | Admitting: Family Medicine

## 2017-10-18 ENCOUNTER — Other Ambulatory Visit (HOSPITAL_COMMUNITY)
Admission: RE | Admit: 2017-10-18 | Discharge: 2017-10-18 | Disposition: A | Discharge status: Home / Self Care | Payer: No Typology Code available for payment source | Source: Ambulatory Visit | Attending: Obstetrics and Gynecology | Admitting: Obstetrics and Gynecology

## 2017-10-18 ENCOUNTER — Encounter (INDEPENDENT_AMBULATORY_CARE_PROVIDER_SITE_OTHER): Payer: Self-pay

## 2017-10-18 DIAGNOSIS — Z01411 Encounter for gynecological examination (general) (routine) with abnormal findings: Secondary | ICD-10-CM | POA: Diagnosis present

## 2017-10-19 ENCOUNTER — Encounter: Payer: Self-pay | Admitting: Internal Medicine

## 2017-10-19 DIAGNOSIS — E119 Type 2 diabetes mellitus without complications: Secondary | ICD-10-CM

## 2017-10-23 ENCOUNTER — Other Ambulatory Visit: Payer: Self-pay | Admitting: Endocrinology

## 2017-10-23 ENCOUNTER — Ambulatory Visit (INDEPENDENT_AMBULATORY_CARE_PROVIDER_SITE_OTHER): Payer: No Typology Code available for payment source

## 2017-10-23 ENCOUNTER — Ambulatory Visit: Payer: No Typology Code available for payment source | Admitting: Podiatry

## 2017-10-23 ENCOUNTER — Other Ambulatory Visit: Payer: Self-pay | Admitting: Podiatry

## 2017-10-23 DIAGNOSIS — L97511 Non-pressure chronic ulcer of other part of right foot limited to breakdown of skin: Secondary | ICD-10-CM

## 2017-10-23 DIAGNOSIS — L03119 Cellulitis of unspecified part of limb: Secondary | ICD-10-CM | POA: Diagnosis not present

## 2017-10-23 DIAGNOSIS — L02619 Cutaneous abscess of unspecified foot: Secondary | ICD-10-CM

## 2017-10-23 DIAGNOSIS — M79671 Pain in right foot: Secondary | ICD-10-CM

## 2017-10-23 LAB — CYTOLOGY - PAP
DIAGNOSIS: NEGATIVE
HPV: NOT DETECTED

## 2017-10-23 MED ORDER — MUPIROCIN 2 % EX OINT: TOPICAL_OINTMENT | CUTANEOUS | 1 refills | Status: DC

## 2017-10-23 MED ORDER — DOXYCYCLINE HYCLATE 100 MG PO TABS: 100.0000 mg | ORAL_TABLET | Freq: Two times a day (BID) | ORAL | 0 refills | Status: DC

## 2017-10-23 MED FILL — DOXYCYCLINE HYCLATE 100 MG: 100 | 10 days supply | Qty: 20 | Fill #0

## 2017-10-23 MED FILL — MUPIROCIN 2% OINTMENT: 2 | 7 days supply | Qty: 22 | Fill #0

## 2017-10-23 MED FILL — PREGABALIN 75 MG CAPS: 75 | 30 days supply | Qty: 60 | Fill #0

## 2017-10-24 NOTE — Progress Notes (Signed)
She presents today complaining of pain to the right foot lateral aspect.  She states that she got a pedicure in July and the technician actually cut her skin on the lateral aspect of the fifth metatarsal right foot.  States that it is painful bleeding.  States that it seems to be doing much better now but is still painful there is no open wound.  She has not taken any antibiotics and she has to wear open shoes in order for the foot not to hurt.  States that her A1c is a 7.9.  Objective: Vital signs are stable alert and oriented x3.  Pulses are palpable.  Neurologic sensorium is intact.  Degenerative flexors are intact.  Muscle strength is 5/5 dorsiflexors plantar flexors inverters everters onto the musculature is intact.  Orthopedic evaluation and straight all joints distal ankle full range of motion without crepitation.  Cutaneous evaluation of straight supple well-hydrated cutis.  No open lesions or wounds are noted though she does have erythema and edema to the lateral aspect of the fifth metatarsal right foot.  There is mild tenderness bunion deformity present.  Assessment: Tenderness bunion deformity with mild cellulitic process fifth metatarsal right foot.  Plan: I put her on doxycycline and Bactroban ointment and placed padding discussed the need for surgical intervention with her.  I will follow-up with her in the near future for reevaluation of this foot she will watch for signs and symptoms of worsening infection should any arise she will notify us immediately.  Currently I feel good that there is no open wound to treat only a mild area of erythema.  Infection however cannot be ruled out.

## 2017-11-06 MED FILL — metFORMIN HCL 1000 MG TABS: 1000 | 90 days supply | Qty: 180 | Fill #2

## 2017-11-08 ENCOUNTER — Encounter: Payer: Self-pay | Admitting: Podiatry

## 2017-11-08 ENCOUNTER — Ambulatory Visit: Payer: No Typology Code available for payment source | Admitting: Podiatry

## 2017-11-08 DIAGNOSIS — M21621 Bunionette of right foot: Secondary | ICD-10-CM

## 2017-11-08 NOTE — Patient Instructions (Signed)
Pre-Operative Instructions  Congratulations, you have decided to take an important step towards improving your quality of life.  You can be assured that the doctors and staff at [facility] will be with you every step of the way.  Here are some important things you should know:  1. Plan to be at the surgery center/hospital at least 1 (one) hour prior to your scheduled time, unless otherwise directed by the surgical center/hospital staff.  You must have a responsible adult accompany you, remain during the surgery and drive you home.  Make sure you have directions to the surgical center/hospital to ensure you arrive on time. 2. If you are having surgery at [facility] or [facility], you will need a copy of your medical history and physical form from your family physician within one month prior to the date of surgery. We will give you a form for your primary physician to complete.  3. We make every effort to accommodate the date you request for surgery.  However, there are times where surgery dates or times have to be moved.  We will contact you as soon as possible if a change in schedule is required.   4. No aspirin/ibuprofen for one week before surgery.  If you are on aspirin, any non-steroidal anti-inflammatory medications (Mobic, Aleve, Ibuprofen) should not be taken seven (7) days prior to your surgery.  You make take Tylenol for pain prior to surgery.  5. Medications - If you are taking daily heart and blood pressure medications, seizure, reflux, allergy, asthma, anxiety, pain or diabetes medications, make sure you notify the surgery center/hospital before the day of surgery so they can tell you which medications you should take or avoid the day of surgery. 6. No food or drink after midnight the night before surgery unless directed otherwise by surgical center/hospital staff. 7. No alcoholic beverages 24-hours prior to surgery.  No smoking 24-hours prior or 24-hours after  surgery. 8. Wear loose pants or shorts. They should be loose enough to fit over bandages, boots, and casts. 9. Don't wear slip-on shoes. Sneakers are preferred. 10. Bring your boot with you to the surgery center/hospital.  Also bring crutches or a walker if your physician has prescribed it for you.  If you do not have this equipment, it will be provided for you after surgery. 11. If you have not been contacted by the surgery center/hospital by the day before your surgery, call to confirm the date and time of your surgery. 12. Leave-time from work may vary depending on the type of surgery you have.  Appropriate arrangements should be made prior to surgery with your employer. 13. Prescriptions will be provided immediately following surgery by your doctor.  Fill these as soon as possible after surgery and take the medication as directed. Pain medications will not be refilled on weekends and must be approved by the doctor. 14. Remove nail polish on the operative foot and avoid getting pedicures prior to surgery. 15. Wash the night before surgery.  The night before surgery wash the foot and leg well with water and the antibacterial soap provided. Be sure to pay special attention to beneath the toenails and in between the toes.  Wash for at least three (3) minutes. Rinse thoroughly with water and dry well with a towel.  Perform this wash unless told not to do so by your physician.  Enclosed: 1 Ice pack (please put in freezer the night before surgery)   1 Hibiclens skin cleaner     Pre-op instructions  If you have any questions regarding the instructions, please do not hesitate to call our office.  : [address], , [place] [address] -- [phone]  [facility]: [address] [place], [place] [address] -- [phone]  [facility]: [address]  [place], [place] [address] -- [phone]  [place]: [address], [address], [place], [place] [address] -- [phone]  Website:  [email or web address] 

## 2017-11-08 NOTE — Progress Notes (Signed)
Presents today for follow-up of her tailor's bunion deformity with cellulitis lateral aspect of the fifth met right.  Cellulitis has resolved but still tender she says.  Objective: Vital signs are stable alert and oriented x3.  Pulses are palpable.  No erythema edema cellulitis drainage or odor no open wounds or lesions.  Tailor's bunion deformity is present and painful on palpation.  Assessment: Tailor's bunion deformity well-healed cellulitis.  Plan: At this point repeat we consented her for 1/5 metatarsal osteotomy with double screw fixation fifth metatarsal right.  We did discuss the possible postop complications which may include but are not limited to postop pain bleeding swelling infection recurrence need for further surgery overcorrection and under correction.  We also discussed the possibility of slow healing because of the diabetes.  We are going to request medical clearance from primary care as well as endocrinology.

## 2017-11-12 ENCOUNTER — Other Ambulatory Visit: Payer: Self-pay | Admitting: Endocrinology

## 2017-11-12 ENCOUNTER — Other Ambulatory Visit: Payer: Self-pay | Admitting: Internal Medicine

## 2017-11-12 MED FILL — LANTUS SOLOSTAR 100 UNITS/M: 100 | 34 days supply | Qty: 30 | Fill #2

## 2017-11-13 ENCOUNTER — Ambulatory Visit (INDEPENDENT_AMBULATORY_CARE_PROVIDER_SITE_OTHER): Payer: No Typology Code available for payment source | Admitting: Internal Medicine

## 2017-11-13 ENCOUNTER — Encounter: Payer: Self-pay | Admitting: Internal Medicine

## 2017-11-13 VITALS — BP 120/80 | HR 100 | Temp 98.2°F | Ht 64.0 in | Wt 233.0 lb

## 2017-11-13 DIAGNOSIS — K219 Gastro-esophageal reflux disease without esophagitis: Secondary | ICD-10-CM | POA: Diagnosis not present

## 2017-11-13 DIAGNOSIS — E785 Hyperlipidemia, unspecified: Secondary | ICD-10-CM

## 2017-11-13 DIAGNOSIS — F32A Depression, unspecified: Secondary | ICD-10-CM

## 2017-11-13 DIAGNOSIS — Z0001 Encounter for general adult medical examination with abnormal findings: Secondary | ICD-10-CM | POA: Diagnosis not present

## 2017-11-13 DIAGNOSIS — C50112 Malignant neoplasm of central portion of left female breast: Secondary | ICD-10-CM

## 2017-11-13 DIAGNOSIS — I10 Essential (primary) hypertension: Secondary | ICD-10-CM

## 2017-11-13 DIAGNOSIS — F419 Anxiety disorder, unspecified: Secondary | ICD-10-CM

## 2017-11-13 DIAGNOSIS — Z794 Long term (current) use of insulin: Secondary | ICD-10-CM

## 2017-11-13 DIAGNOSIS — F329 Major depressive disorder, single episode, unspecified: Secondary | ICD-10-CM

## 2017-11-13 DIAGNOSIS — E890 Postprocedural hypothyroidism: Secondary | ICD-10-CM | POA: Diagnosis not present

## 2017-11-13 DIAGNOSIS — E1142 Type 2 diabetes mellitus with diabetic polyneuropathy: Secondary | ICD-10-CM | POA: Diagnosis not present

## 2017-11-13 DIAGNOSIS — Z170 Estrogen receptor positive status [ER+]: Secondary | ICD-10-CM

## 2017-11-13 MED FILL — LEVOTHYROXINE 150 MCG TAB: 150 | 30 days supply | Qty: 30 | Fill #0

## 2017-11-13 NOTE — Assessment & Plan Note (Signed)
In remission Continue Tamoxifen Continue to follow with oncology

## 2017-11-13 NOTE — Assessment & Plan Note (Signed)
Controlled on Losartan Reinforced DASH diet and exercise for weight loss 

## 2017-11-13 NOTE — Assessment & Plan Note (Signed)
Continue current dose of Levothyroxine Labs reviewed, no need to repeat at this time

## 2017-11-13 NOTE — Assessment & Plan Note (Signed)
Follows with Dr. Loanne Drilling Recent A1C reviewed No need for microalbumin secondary to ARB therapy Encouraged her to consume a low carb diet and exercise for weight loss Continue Farxiga, Lantus, Metoprolol, Gabepentin and Lyrica Encouraged yearly eye exams Foot exam today She declines pneumovax

## 2017-11-13 NOTE — Assessment & Plan Note (Signed)
Lipid profile reviewed Encouraged her to consume a low fat diet Continue rosuvastatin for now

## 2017-11-13 NOTE — Assessment & Plan Note (Signed)
Stable on Pantoprazole Encouraged weight loss and discussed how this could help improve reflux

## 2017-11-13 NOTE — Assessment & Plan Note (Signed)
Stable off meds °Will monitor °

## 2017-11-13 NOTE — Patient Instructions (Signed)

## 2017-11-13 NOTE — Progress Notes (Signed)
Subjective:    Patient ID: Stacey Roberts, female    DOB: [birth year], 52 y.o.   MRN: [id number]  HPI  Pt presents to the clinic today for her annual exam. She is also due to follow up chronic conditions.  Hx of Breast Cancer: Reoccurrence 09/2015. S/p lumpectomy, reduction and reconstruction. S/p radiation. She takes Tamoxifen as prescribed. She follows with Dr. Griffith Citron.  DM 2: Her [name] was 7.9%, 09/2017. She is taking [place], Metformin and Lantus as prescribed. She can not take Trulicity. She takes Lyrica and Gabapentin for neuropathic pain. Her sugars range 100-180. She does not routinely check her feet. Her last eye exam was. She follows with Dr. Loanne Drilling.  GERD: She is not sure what triggers this. She is taking Pantoprazole as prescribed. She denies breakthrough symptoms. There is no upper GI on file.  HLD: Her last LDL was 63, 02/2017. She denies myalgias on Rosuvastatin. She does not consume a low fat diet.  HTN: Her BP today is 120/80. She is taking Losartan as prescribed. ECG from 02/2017 reviewed.  Hypothyroidism: Postsurgical secondary to thyroid cancer. She denies any issues on her current dose of Levothyroxine. She follows with Dr. Loanne Drilling.   Anxiety: Currently not an issue off meds.  Flu: 10/2017 Tetanus: 04/2005 Pneumovax: 04/2005 Pap Smear: 09/2017 Mammogram: 05/2017 Colon Screening: 03/2015 Vision Screening: annually Dentist: as needed  Diet: She does eat meat. She consumes fruits and veggies daily. She does eat fried foods. She drinks coffee, water. Exercise: None  Review of Systems      Past Medical History:  Diagnosis Date  . Allergy    seasonal  . Breast cancer ([place]) 2014  . Breast cancer ([place]) 2017  . Bronchitis    hx of  . Diabetes mellitus, type II ([place])   . GERD (gastroesophageal reflux disease)   . Headache   . History of radiation therapy 12/01/15-01/11/16   Left breast DIBH / 50.4 Gy in 28 fractions and Left breast boost / 12 Gy in 6  fractions  . Hyperlipidemia   . Hypertension   . Hypothyroidism   . Joint pain   . Obesity   . PCOS (polycystic ovarian syndrome)   . Personal history of radiation therapy 2017  . Pneumonia    as a child  . Sleep apnea 2008   severe OSA-does not use a cpap  . Snoring   . 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 Buddy Duty    Current Outpatient Medications  Medication Sig Dispense Refill  . aspirin 81 MG chewable tablet Chew 81 mg by mouth daily.    . Cholecalciferol (EQL VITAMIN D3) 2000 units CAPS Take 2,000 Units daily by mouth.    . dapagliflozin propanediol (FARXIGA) 5 MG TABS tablet Take 5 mg by mouth daily. 90 tablet 3  . doxycycline (VIBRA-TABS) 100 MG tablet Take 1 tablet (100 mg total) by mouth 2 (two) times daily. 20 tablet 0  . Dulaglutide (TRULICITY) 1.5 GU/4.4IH SOPN Inject 1.5 mg into the skin once a week. 4 pen 11  . gabapentin (NEURONTIN) 300 MG capsule Take 1 capsule (300 mg total) by mouth at bedtime. 90 capsule 4  . glucose blood (ACCU-CHEK GUIDE) test strip 1 each by Other route 2 (two) times daily. Used to check blood sugars 1x daily. 180 each 3  . Insulin Glargine (LANTUS SOLOSTAR) 100 UNIT/ML Solostar Pen Inject 60 Units into the  skin every morning. And pen needles 1/day 30 pen 11  . levothyroxine (SYNTHROID, LEVOTHROID) 150 MCG tablet TAKE 1 TABLET BY MOUTH ONCE DAILY 30 tablet 0  . losartan (COZAAR) 50 MG tablet TAKE 1 TABLET BY MOUTH ONCE DAILY 30 tablet 0  . LYRICA 75 MG capsule TAKE 1 CAPSULE BY MOUTH TWICE DAILY 60 capsule 4  . metFORMIN (GLUCOPHAGE) 1000 MG tablet Take 1 tablet (1,000 mg total) by mouth 2 (two) times daily with a meal. 180 tablet 3  . mupirocin ointment (BACTROBAN) 2 % Apply to wound after soaking BID 30 g 1  . pantoprazole (PROTONIX) 40 MG tablet TAKE 1 TABLET (40 MG TOTAL) BY MOUTH DAILY. 90 tablet 0  . rosuvastatin (CRESTOR) 10 MG  tablet TAKE 1 TABLET (10 MG TOTAL) BY MOUTH DAILY. 90 tablet 3  . tamoxifen (NOLVADEX) 20 MG tablet Take 20 mg daily by mouth.   10  . Vitamin D, Ergocalciferol, (DRISDOL) 50000 units CAPS capsule Take 1 capsule (50,000 Units total) by mouth every 7 (seven) days. 4 capsule 0   No current facility-administered medications for this visit.     Allergies  Allergen Reactions  . Penicillins Shortness Of Breath, Rash and Other (See Comments)    Has patient had a PCN reaction causing immediate rash, facial/tongue/throat swelling, SOB or lightheadedness with hypotension: Yes Has patient had a PCN reaction causing severe rash involving mucus membranes or skin necrosis: Yes Has patient had a PCN reaction that required hospitalization No Has patient had a PCN reaction occurring within the last 10 years: Yes If all of the above answers are "NO", then may proceed with Cephalosporin use.   . Clindamycin/Lincomycin Other (See Comments)    Severe stomach cramps  . Ace Inhibitors Other (See Comments) and Cough    REACTION: cough; denies airway involvement     Family History  Problem Relation Age of Onset  . Dementia Father        deceased age 2 secondary to dementia  . Bipolar disorder Father   . Emphysema Father   . Heart disease Father   . COPD Father        smoker  . Hypertension Father   . Depression Father   . Alcoholism Father   . CAD Mother 31       Died age 30 of CAD  . Heart disease Mother   . Graves' disease Mother   . Hypertension Mother   . Thyroid disease Mother   . CAD Maternal Grandfather 60  . Heart disease Maternal Grandfather   . Other Sister 78       hysterectomy for fibroids  . Prostate cancer Brother        low grade; w/ surveillance  . Thyroid nodules Brother 76  . Fibroids Other        niece dx approx 87  . Hypertension Other   . Kidney cancer Cousin        maternal 1st cousin dx 14-47; former smoker  . Lung cancer Other        maternal great aunt (MGM's  sister); not a smoker  . Cancer Other        nephew dx neuroblastoma at 47.49 years old  . Colon cancer Neg Hx   . Colon polyps Neg Hx     Social History   Socioeconomic History  . Marital status: Single    Spouse name: Not on file  . Number of children: Not on file  . Years of  education: Not on file  . Highest education level: Not on file  Occupational History    Employer: [employer]    Comment: Outpatient scheduling in radiology  Social Needs  . Financial resource strain: Not on file  . Food insecurity:    Worry: Not on file    Inability: Not on file  . Transportation needs:    Medical: Not on file    Non-medical: Not on file  Tobacco Use  . Smoking status: Former Smoker    Packs/day: 0.50    Types: Cigarettes    Last attempt to quit: 09/29/2007    Years since quitting: 10.1  . Smokeless tobacco: Never Used  . Tobacco comment: Smoked on and off for 20 years. Quit about 8 years ago (as of 08/2015)  Substance and Sexual Activity  . Alcohol use: Yes    Alcohol/week: 0.0 standard drinks    Comment: occasionally  . Drug use: No  . Sexual activity: Yes  Lifestyle  . Physical activity:    Days per week: Not on file    Minutes per session: Not on file  . Stress: Not on file  Relationships  . Social connections:    Talks on phone: Not on file    Gets together: Not on file    Attends religious service: Not on file    Active member of club or organization: Not on file    Attends meetings of clubs or organizations: Not on file    Relationship status: Not on file  . Intimate partner violence:    Fear of current or ex partner: Not on file    Emotionally abused: Not on file    Physically abused: Not on file    Forced sexual activity: Not on file  Other Topics Concern  . Not on file  Social History Narrative   The patient is divorced, moved to [place] in   2006 from [place].  She does not have any children, currently lives   with her boyfriend and is working as  a Nurse, adult for [employer].    No alcohol.  Tobacco use:  She quit 5 years ago.  She smoked on   and off for approximately 20 years.  No history of recreational drug   Use.Drinks two cups of coffee per work day.      Constitutional: Denies fever, malaise, fatigue, headache or abrupt weight changes.  HEENT: Denies eye pain, eye redness, ear pain, ringing in the ears, wax buildup, runny nose, nasal congestion, bloody nose, or sore throat. Respiratory: Denies difficulty breathing, shortness of breath, cough or sputum production.   Cardiovascular: Denies chest pain, chest tightness, palpitations or swelling in the hands or feet.  Gastrointestinal: Denies abdominal pain, bloating, constipation, diarrhea or blood in the stool.  GU: Denies urgency, frequency, pain with urination, burning sensation, blood in urine, odor or discharge. Musculoskeletal: Denies decrease in range of motion, difficulty with gait, muscle pain or joint pain and swelling.  Skin: Denies redness, rashes, lesions or ulcercations.  Neurological: Pt reports neuropathic pain in feet. Denies dizziness, difficulty with memory, difficulty with speech or problems with balance and coordination.  Psych: Denies anxiety, depression, SI/HI.  No other specific complaints in a complete review of systems (except as listed in HPI above).  Objective:   Physical Exam  BP 120/80   Pulse 100   Temp 98.2 F (36.8 C) (Oral)   Ht 5\' 4"  (1.626 m)   Wt 233 lb (105.7 kg)  SpO2 97%   BMI 39.99 kg/m  Wt Readings from Last 3 Encounters:  11/13/17 233 lb (105.7 kg)  09/25/17 231 lb (104.8 kg)  09/19/17 228 lb (103.4 kg)    General: Appears her stated age, obese in NAD. Skin: Warm, dry and intact. Red area noted on lateral edge, right foot. HEENT: Head: normal shape and size; Eyes: sclera white, no icterus, conjunctiva pink, PERRLA and EOMs intact; Ears: Tm's gray and intact, normal light reflex;  Throat/Mouth: Teeth present,  mucosa pink and moist, no exudate, lesions or ulcerations noted.  Neck:  Neck supple, trachea midline. No masses, lumps or thyromegaly present.  Cardiovascular: Normal rate and rhythm. S1,S2 noted.  No murmur, rubs or gallops noted. No JVD or BLE edema. No carotid bruits noted. Pulmonary/Chest: Normal effort and positive vesicular breath sounds. No respiratory distress. No wheezes, rales or ronchi noted.  Abdomen: Soft and nontender. Normal bowel sounds. No distention or masses noted. Liver, spleen and kidneys non palpable. Musculoskeletal: Strength 5/5 BUE/BLE. No difficulty with gait.  Neurological: Alert and oriented. Cranial nerves II-XII grossly intact. Coordination normal.  Psychiatric: Mood and affect normal. Behavior is normal. Judgment and thought content normal.     BMET    Component Value Date/Time   NA 142 07/16/2017 1442   NA 138 03/21/2017 1018   NA 137 02/18/2013 1554   K 4.2 07/16/2017 1442   K 4.4 02/18/2013 1554   CL 104 07/16/2017 1442   CO2 29 07/16/2017 1442   CO2 25 02/18/2013 1554   GLUCOSE 94 07/16/2017 1442   GLUCOSE 300 (H) 02/18/2013 1554   BUN 11 07/16/2017 1442   BUN 16 03/21/2017 1018   BUN 14.2 02/18/2013 1554   CREATININE 0.67 07/16/2017 1442   CREATININE 0.8 02/18/2013 1554   CALCIUM 9.3 07/16/2017 1442   CALCIUM 9.2 02/18/2013 1554   GFRNONAA >60 07/16/2017 1442   GFRAA >60 07/16/2017 1442    Lipid Panel     Component Value Date/Time   CHOL 122 03/21/2017 1018   TRIG 90 03/21/2017 1018   HDL 41 03/21/2017 1018   CHOLHDL 4 09/28/2016 1451   VLDL 32.2 09/28/2016 1451   LDLCALC 63 03/21/2017 1018    CBC    Component Value Date/Time   WBC 10.3 07/16/2017 1442   WBC 6.2 09/28/2016 1451   RBC 4.65 07/16/2017 1442   HGB 13.0 07/16/2017 1442   HGB 13.5 03/21/2017 1018   HGB 12.5 02/18/2013 1554   HCT 39.4 07/16/2017 1442   HCT 42.0 03/21/2017 1018   HCT 39.6 02/18/2013 1554   PLT 274 07/16/2017 1442   PLT 306 02/18/2013 1554   MCV  84.9 07/16/2017 1442   MCV 86 03/21/2017 1018   MCV 82.6 02/18/2013 1554   MCH 27.9 07/16/2017 1442   MCHC 32.9 07/16/2017 1442   RDW 14.3 07/16/2017 1442   RDW 14.6 03/21/2017 1018   RDW 16.4 (H) 02/18/2013 1554   LYMPHSABS 2.2 07/16/2017 1442   LYMPHSABS 1.5 03/21/2017 1018   LYMPHSABS 2.0 02/18/2013 1554   MONOABS 0.5 07/16/2017 1442   MONOABS 0.6 02/18/2013 1554   EOSABS 0.1 07/16/2017 1442   EOSABS 0.1 03/21/2017 1018   BASOSABS 0.1 07/16/2017 1442   BASOSABS 0.0 03/21/2017 1018   BASOSABS 0.0 02/18/2013 1554    Hgb A1C Lab Results  Component Value Date   HGBA1C 7.9 (A) 09/25/2017           Assessment & Plan:   Preventative Health Maintenance:  Flu  shot UTD She declines tetanus booster and pneumovax Pap smear and mammogram UTD Colon screening UTD Encouraged her to consume a balanced diet and exercise regimen Advised her to see an eye doctor and dentist annually Labs reviewed, no need to repeat at this time  RTC in 1 year, sooner if needed [name], NP

## 2017-11-14 MED FILL — LOSARTAN POTASSIUM 50 MG TA: 50 | 90 days supply | Qty: 90 | Fill #0

## 2017-11-19 ENCOUNTER — Other Ambulatory Visit: Payer: Self-pay | Admitting: Obstetrics and Gynecology

## 2017-11-23 ENCOUNTER — Telehealth: Payer: Self-pay | Admitting: *Deleted

## 2017-11-23 NOTE — Telephone Encounter (Signed)
"  I'm a patient of Dr. Milinda Pointer.  I'm scheduled for surgery on November 22.  I want to cancel it.  I want to have it done in the new year.  I'll call back to schedule it."  I'll cancel it with the surgical center and I will let Dr. Milinda Pointer know.  I called Caren Griffins at the surgical center and canceled her surgery.

## 2017-11-27 ENCOUNTER — Other Ambulatory Visit: Payer: Self-pay | Admitting: Internal Medicine

## 2017-11-27 ENCOUNTER — Encounter: Payer: Self-pay | Admitting: *Deleted

## 2017-11-27 MED FILL — PANTOPRAZOLE SOD DR 40 MG T: 40 | 90 days supply | Qty: 90 | Fill #0

## 2017-11-27 NOTE — Progress Notes (Signed)
Per Dr. Milinda Pointer, I sent a medical clearance request letter to Dr. Loanne Drilling.

## 2017-11-28 ENCOUNTER — Telehealth: Payer: Self-pay | Admitting: Endocrinology

## 2017-11-28 NOTE — Telephone Encounter (Signed)
please call patient: Please move up next appt to next available, as we need to plan for upcoming surgery.

## 2017-11-29 NOTE — Telephone Encounter (Signed)
Please call pt to schedule appt to soonest available per Dr. Cordelia Pen request. Thanks

## 2017-11-30 NOTE — Telephone Encounter (Signed)
Called pt to clarify Dr. Cordelia Pen reasoning for requesting to be seen sooner than 12/5. Since surgery has been canceled, surgical clearance is no longer needed. Therefore, pt has been advised to keep appt as scheduled on 12/5 with Dr. Loanne Drilling. Verbalized appreciation, acceptance and understanding.

## 2017-11-30 NOTE — Telephone Encounter (Signed)
Please refer to pt response 

## 2017-11-30 NOTE — Telephone Encounter (Signed)
Pt has cancelled the gastric sleeve surgery. So per MD request for surgical clearance was the reasoning to bring patient in sooner. Per MD no longer needs to have appt moved up.

## 2017-11-30 NOTE — Telephone Encounter (Signed)
I called patient per your request. Patient stated she is not having surgery and did not reschedule her appointment to come in sooner.

## 2017-12-12 ENCOUNTER — Other Ambulatory Visit: Payer: Self-pay | Admitting: Endocrinology

## 2017-12-12 MED FILL — TAMOXIFEN CIT 20 MG TABLET: 20 | 90 days supply | Qty: 90 | Fill #1

## 2017-12-12 MED FILL — LEVOTHYROXINE 150 MCG TAB: 150 | 30 days supply | Qty: 30 | Fill #0

## 2017-12-24 ENCOUNTER — Other Ambulatory Visit: Payer: Self-pay | Admitting: Podiatry

## 2017-12-24 MED FILL — TRULICITY 1.5 MG/0.5 ML PEN: 1.5 | 28 days supply | Qty: 2 | Fill #1

## 2017-12-27 ENCOUNTER — Other Ambulatory Visit: Payer: Self-pay | Admitting: Podiatry

## 2017-12-27 ENCOUNTER — Encounter: Payer: Self-pay | Admitting: Endocrinology

## 2017-12-27 ENCOUNTER — Ambulatory Visit (INDEPENDENT_AMBULATORY_CARE_PROVIDER_SITE_OTHER): Payer: No Typology Code available for payment source | Admitting: Endocrinology

## 2017-12-27 VITALS — BP 142/78 | HR 101 | Ht 64.0 in | Wt 235.8 lb

## 2017-12-27 DIAGNOSIS — Z794 Long term (current) use of insulin: Secondary | ICD-10-CM | POA: Diagnosis not present

## 2017-12-27 DIAGNOSIS — E1142 Type 2 diabetes mellitus with diabetic polyneuropathy: Secondary | ICD-10-CM | POA: Diagnosis not present

## 2017-12-27 LAB — POCT GLYCOSYLATED HEMOGLOBIN (HGB A1C): HEMOGLOBIN A1C: 8.1 % — AB (ref 4.0–5.6)

## 2017-12-27 MED ORDER — LEVOTHYROXINE SODIUM 150 MCG PO TABS: 150.0000 ug | ORAL_TABLET | Freq: Every day | ORAL | 3 refills | Status: DC

## 2017-12-27 MED ORDER — INSULIN GLARGINE 100 UNIT/ML SOLOSTAR PEN: 65.0000 [IU] | PEN_INJECTOR | SUBCUTANEOUS | 11 refills | Status: DC

## 2017-12-27 MED FILL — LANTUS SOLOSTAR 100 UNITS/M: 100 | 47 days supply | Qty: 30 | Fill #0

## 2017-12-27 NOTE — Progress Notes (Signed)
Subjective:    Patient ID: Stacey Roberts, female    DOB: 1965-06-22, 52 y.o.   MRN: [id number]  HPI Pt has stage-1 papillary adenocarcinoma of the thyroid.    2/11: thyroidectomy: T1b N0 M0.  4/11: RAI 103 mCi, with thyrogen.  12/11: neck US: single enlarged right cervical lymph node, nonspecific.   6/12: body scan (thyrogen) neg.   12/12: [place]: lymph node is stable.  6/13: [place]: overall node morphology and volume show very little change.   9/14  TG undetectable (ab neg) 3/15: TG undetectable (ab neg) 10/15 TG undetectable (ab neg).  3/17 TG undetectable (ab neg).   4/19 TG undetectable (ab neg).    Pt returns for f/u of diabetes mellitus: DM type: Insulin-requiring type 2 Dx'ed: 8786 Complications: painful neuropathy of the lower extremities.  Therapy: insulin since early 7672 (also trulicity and 2 oral meds).   GDM: never.  DKA: never.   Severe hypoglycemia: never.   Pancreatitis: never.  Other: she declines multiple daily injections; edema precudes pioglitizone rx.  She declines bariatric surgery for now.   Interval history: She never misses the insulin.  no cbg record, but states cbg varies from 105-200's.  She declines to add bromocriptine.   Past Medical History:  Diagnosis Date  . Allergy    seasonal  . Breast cancer ([facility]) 2014  . Breast cancer ([facility]) 2017  . Bronchitis    hx of  . Diabetes mellitus, type II ([place])   . GERD (gastroesophageal reflux disease)   . Headache   . History of radiation therapy 12/01/15-01/11/16   Left breast DIBH / 50.4 Gy in 28 fractions and Left breast boost / 12 Gy in 6 fractions  . Hyperlipidemia   . Hypertension   . Hypothyroidism   . Joint pain   . Obesity   . PCOS (polycystic ovarian syndrome)   . Personal history of radiation therapy 2017  . Pneumonia    as a child  . Sleep apnea 2008   severe OSA-does not use a cpap  . Snoring   . 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 Buddy Duty    Past Surgical History:  Procedure Laterality Date  . BREAST BIOPSY  2000   s/p  . BREAST BIOPSY Left 01/22/2013   Procedure: RE-EXCISION LEFT BREAST DCIS;  Surgeon: Harl Bowie, MD;  Location: [facility];  Service: General;  Laterality: Left;  . BREAST LUMPECTOMY Left 01/06/2013   Procedure: LUMPECTOMY;  Surgeon: Harl Bowie, MD;  Location: [facility];  Service: General;  Laterality: Left;  . BREAST LUMPECTOMY Left 2017  . BREAST LUMPECTOMY WITH NEEDLE LOCALIZATION AND AXILLARY SENTINEL LYMPH NODE BX Left 09/30/2015   Procedure: Left BREAST LUMPECTOMY WITH NEEDLE LOCALIZATION AND AXILLARY SENTINEL LYMPH NODE BX;  Surgeon: Coralie Keens, MD;  Location: [facility];  Service: General;  Laterality: Left;  . BREAST RECONSTRUCTION Bilateral 10/12/2015   Procedure: BREAST oncoplastic RECONSTRUCTION;  Surgeon: Irene Limbo, MD;  Location: [facility];  Service: Plastics;  Laterality: Bilateral;  . BREAST REDUCTION SURGERY Bilateral 10/12/2015   Procedure: MAMMARY REDUCTION  (BREAST);  Surgeon: Irene Limbo, MD;  Location: [facility];  Service: Plastics;  Laterality: Bilateral;  . DILATION AND CURETTAGE OF UTERUS  2004   s/p  . EXCISION OF BREAST LESION Left 10/12/2015   Procedure: Re-EXCISION OF BREAST;  Surgeon: Coralie Keens, MD;  Location: [facility];  Service: General;  Laterality: Left;  . REDUCTION MAMMAPLASTY Bilateral 09/2015  . THYROIDECTOMY  02/2009   Dr Harlow Asa  . TONSILLECTOMY  1982    Social History   Socioeconomic History  . Marital status: Single    Spouse name: Not on file  . Number of children: Not on file  . Years of education: Not on file  . Highest education level: Not on file  Occupational History    Employer: [employer]    Comment: Outpatient scheduling in radiology  Social Needs  . Financial resource strain: Not on file  .  Food insecurity:    Worry: Not on file    Inability: Not on file  . Transportation needs:    Medical: Not on file    Non-medical: Not on file  Tobacco Use  . Smoking status: Former Smoker    Packs/day: 0.50    Types: Cigarettes    Last attempt to quit: 09/29/2007    Years since quitting: 10.2  . Smokeless tobacco: Never Used  . Tobacco comment: Smoked on and off for 20 years. Quit about 8 years ago (as of 08/2015)  Substance and Sexual Activity  . Alcohol use: Yes    Alcohol/week: 0.0 standard drinks    Comment: occasionally  . Drug use: No  . Sexual activity: Yes  Lifestyle  . Physical activity:    Days per week: Not on file    Minutes per session: Not on file  . Stress: Not on file  Relationships  . Social connections:    Talks on phone: Not on file    Gets together: Not on file    Attends religious service: Not on file    Active member of club or organization: Not on file    Attends meetings of clubs or organizations: Not on file    Relationship status: Not on file  . Intimate partner violence:    Fear of current or ex partner: Not on file    Emotionally abused: Not on file    Physically abused: Not on file    Forced sexual activity: Not on file  Other Topics Concern  . Not on file  Social History Narrative   The patient is divorced, moved to [place] in   2006 from [place].  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    No alcohol.  Tobacco use:  She quit 5 years ago.  She smoked on   and off for approximately 20 years.  No history of recreational drug   Use.Drinks two cups of coffee per work day.     Current Outpatient Medications on File Prior to Visit  Medication Sig Dispense Refill  . Cholecalciferol (EQL VITAMIN D3) 2000 units CAPS Take 2,000 Units daily by mouth.    . dapagliflozin propanediol (FARXIGA) 5 MG TABS tablet Take 5 mg by mouth daily. 90 tablet 3  .  Dulaglutide (TRULICITY) 1.5 FV/4.9SW SOPN Inject 1.5 mg into the skin once a week. 4 pen 11  . gabapentin (NEURONTIN) 300 MG capsule Take 1 capsule (300 mg total) by mouth at bedtime. 90 capsule 4  . glucose blood (ACCU-CHEK GUIDE) test strip 1 each by Other route 2 (two) times daily. Used to check blood sugars 1x daily. 180 each 3  . losartan (COZAAR) 50 MG tablet TAKE 1 TABLET BY MOUTH ONCE DAILY 90 tablet  2  . metFORMIN (GLUCOPHAGE) 1000 MG tablet Take 1 tablet (1,000 mg total) by mouth 2 (two) times daily with a meal. 180 tablet 3  . mupirocin ointment (BACTROBAN) 2 % Apply to wound after soaking BID 30 g 1  . pantoprazole (PROTONIX) 40 MG tablet TAKE 1 TABLET (40 MG TOTAL) BY MOUTH DAILY. 90 tablet 2  . rosuvastatin (CRESTOR) 10 MG tablet TAKE 1 TABLET (10 MG TOTAL) BY MOUTH DAILY. 90 tablet 3  . tamoxifen (NOLVADEX) 20 MG tablet Take 20 mg daily by mouth.   10  . [DISCONTINUED] Calcium Carbonate 1500 MG TABS Take 1,500 mg by mouth daily.       No current facility-administered medications on file prior to visit.     Allergies  Allergen Reactions  . Penicillins Shortness Of Breath, Rash and Other (See Comments)    Has patient had a PCN reaction causing immediate rash, facial/tongue/throat swelling, SOB or lightheadedness with hypotension: Yes Has patient had a PCN reaction causing severe rash involving mucus membranes or skin necrosis: Yes Has patient had a PCN reaction that required hospitalization No Has patient had a PCN reaction occurring within the last 10 years: Yes If all of the above answers are "NO", then may proceed with Cephalosporin use.   . Clindamycin/Lincomycin Other (See Comments)    Severe stomach cramps  . Ace Inhibitors Other (See Comments) and Cough    REACTION: cough; denies airway involvement     Family History  Problem Relation Age of Onset  . Dementia Father        deceased age 33 secondary to dementia  . Bipolar disorder Father   . Emphysema Father   .  Heart disease Father   . COPD Father        smoker  . Hypertension Father   . Depression Father   . Alcoholism Father   . CAD Mother 94       Died age 54 of CAD  . Heart disease Mother   . Graves' disease Mother   . Hypertension Mother   . Thyroid disease Mother   . CAD Maternal Grandfather 60  . Heart disease Maternal Grandfather   . Other Sister 17       hysterectomy for fibroids  . Prostate cancer Brother        low grade; w/ surveillance  . Thyroid nodules Brother 2  . Fibroids Other        niece dx approx 62  . Hypertension Other   . Kidney cancer Cousin        maternal 1st cousin dx 57-47; former smoker  . Lung cancer Other        maternal great aunt (MGM's sister); not a smoker  . Cancer Other        nephew dx neuroblastoma at 20.82 years old  . Colon cancer Neg Hx   . Colon polyps Neg Hx     BP (!) 142/78 (BP Location: Left Arm, Patient Position: Sitting, Cuff Size: Large)   Pulse (!) 101   Ht 5\' 4"  (1.626 m)   Wt 235 lb 12.8 oz (107 kg)   SpO2 95%   BMI 40.47 kg/m    Review of Systems She denies hypoglycemia and nausea    Objective:   Physical Exam VITAL SIGNS:  See vs page GENERAL: no distress Pulses: dorsalis pedis intact bilat.   MSK: no deformity of the feet CV: trace bilat leg edema Skin:  no ulcer on the feet.  normal color and temp on the feet. Neuro: sensation is intact to touch on the feet  Lab Results  Component Value Date   CREATININE 0.67 07/16/2017   BUN 11 07/16/2017   NA 142 07/16/2017   K 4.2 07/16/2017   CL 104 07/16/2017   CO2 29 07/16/2017     Lab Results  Component Value Date   HGBA1C 8.1 (A) 12/27/2017      Assessment & Plan:  Insulin-requiring type 2 DM, with polyneuropathy: worse Edema: this limits rx options  Patient Instructions  I have sent a prescription to your pharmacy, to slightly increase the lantus, and: Please continue the same other diabetes medications. check your blood sugar twice a day.  vary the  time of day when you check, between before the 3 meals, and at bedtime.  also check if you have symptoms of your blood sugar being too high or too low.  please keep a record of the readings and bring it to your next appointment here (or you can bring the meter itself).  You can write it on any piece of paper.  please call us sooner if your blood sugar goes below 70, or if you have a lot of readings over 200.  Some specialists say that taking a high amount of folic acid (4-5 mg per day) helps the neuropathy.   Please come back for a follow-up appointment in 2 months.

## 2017-12-27 NOTE — Patient Instructions (Addendum)
I have sent a prescription to your pharmacy, to slightly increase the lantus, and: Please continue the same other diabetes medications. check your blood sugar twice a day.  vary the time of day when you check, between before the 3 meals, and at bedtime.  also check if you have symptoms of your blood sugar being too high or too low.  please keep a record of the readings and bring it to your next appointment here (or you can bring the meter itself).  You can write it on any piece of paper.  please call us sooner if your blood sugar goes below 70, or if you have a lot of readings over 200.  Some specialists say that taking a high amount of folic acid (4-5 mg per day) helps the neuropathy.   Please come back for a follow-up appointment in 2 months.

## 2017-12-28 MED ORDER — PREGABALIN 75 MG PO CAPS: 75.0000 mg | ORAL_CAPSULE | Freq: Two times a day (BID) | ORAL | 0 refills | Status: DC

## 2018-01-04 ENCOUNTER — Other Ambulatory Visit: Payer: Self-pay | Admitting: Podiatry

## 2018-01-06 ENCOUNTER — Encounter: Payer: Self-pay | Admitting: Podiatry

## 2018-01-07 MED FILL — PREGABALIN 75 MG CAPS: 75 | 15 days supply | Qty: 30 | Fill #0

## 2018-01-07 NOTE — Telephone Encounter (Signed)
Orders called to [facility].

## 2018-01-08 MED FILL — GABAPENTIN 300 MG CAPSULE: 300 | 90 days supply | Qty: 90 | Fill #0

## 2018-01-09 ENCOUNTER — Encounter: Payer: Self-pay | Admitting: Endocrinology

## 2018-01-09 ENCOUNTER — Other Ambulatory Visit: Payer: Self-pay

## 2018-01-09 MED ORDER — LEVOTHYROXINE SODIUM 150 MCG PO TABS: 150.0000 ug | ORAL_TABLET | Freq: Every day | ORAL | 3 refills | Status: DC

## 2018-01-09 MED FILL — LEVOTHYROXINE 150 MCG TAB: 150 | 90 days supply | Qty: 90 | Fill #0

## 2018-01-14 MED FILL — FARXIGA 5 MG TABLET: 5 | 90 days supply | Qty: 90 | Fill #0

## 2018-02-04 MED FILL — PREGABALIN 75 MG CAPS: 75 | 15 days supply | Qty: 30 | Fill #1

## 2018-02-12 ENCOUNTER — Ambulatory Visit (INDEPENDENT_AMBULATORY_CARE_PROVIDER_SITE_OTHER): Payer: Self-pay | Admitting: Physician Assistant

## 2018-02-12 ENCOUNTER — Other Ambulatory Visit: Payer: Self-pay | Admitting: Endocrinology

## 2018-02-12 VITALS — BP 115/80 | HR 95 | Temp 98.0°F | Resp 18 | Wt 235.0 lb

## 2018-02-12 DIAGNOSIS — J069 Acute upper respiratory infection, unspecified: Secondary | ICD-10-CM

## 2018-02-12 MED ORDER — FLUTICASONE PROPIONATE 50 MCG/ACT NA SUSP: 2.0000 | Freq: Every day | NASAL | 0 refills | Status: DC

## 2018-02-12 MED ORDER — PSEUDOEPH-BROMPHEN-DM 30-2-10 MG/5ML PO SYRP: 5.0000 mL | ORAL_SOLUTION | Freq: Three times a day (TID) | ORAL | 0 refills | Status: DC | PRN

## 2018-02-12 MED ORDER — SALINE SPRAY 0.65 % NA SOLN: 1.0000 | NASAL | 0 refills | Status: DC | PRN

## 2018-02-12 MED FILL — FLUTICASONE PROP 50 MCG SPR: 50 | 30 days supply | Qty: 16 | Fill #0

## 2018-02-12 MED FILL — BROMPHENIR-PSEUDOEPHED-DM S: 30-2-10 | 8 days supply | Qty: 120 | Fill #0

## 2018-02-12 NOTE — Progress Notes (Signed)
MRN: [id number] DOB: 01-02-1966  Subjective:   Stacey Roberts is a 53 y.o. female presenting for chief complaint of scratchy throat, nasal congestion, sneezing, and dry cough x 2 days. Gradual onset. Denies fever, ear fullness, inability to swallow, voice change, productive cough, wheezing, shortness of breath, dyspnea on exertion, chest tightness, chest pain and myalgia, night sweats, nausea, vomiting, abdominal pain and diarrhea. Has tried mucinex a few times with no full relief. No known sick contact exposure. PMH of seasonal allergies: not taking antihistamines or using nasal spray at this time, DM: [name] 8.1 in 12/2017, and controlled HTN.  In remission from breast cancer and thyroid cancer.  No PMH of asthma.  Patient has had flu shot this season. Denies smoking.  Denies any other aggravating or relieving factors, no other questions or concerns.  Review of Systems  Constitutional: Negative for diaphoresis.  Respiratory: Negative for hemoptysis and stridor.   Cardiovascular: Negative for palpitations.  Musculoskeletal: Negative for neck pain.  Skin: Negative for rash.  Neurological: Negative for dizziness and weakness.    Stacey Roberts has a current medication list which includes the following prescription(s): chlorphen-pseudoephed-apap, dapagliflozin propanediol, dulaglutide, gabapentin, glucose blood, insulin glargine, levothyroxine, losartan, metformin, pantoprazole, pregabalin, rosuvastatin, tamoxifen, cholecalciferol, mupirocin ointment, and calcium carbonate. Also is allergic to penicillins; clindamycin/lincomycin; and ace inhibitors.  Stacey Roberts  has a past medical history of Allergy, Breast cancer ([facility]) (2014), Breast cancer ([facility]) (2017), Bronchitis, Diabetes mellitus, type II ([facility]), GERD (gastroesophageal reflux disease), Headache, History of radiation therapy (12/01/15-01/11/16), Hyperlipidemia, Hypertension, Hypothyroidism, Joint pain, Obesity, PCOS (polycystic ovarian syndrome), Personal history  of radiation therapy (2017), Pneumonia, Sleep apnea (2008), Snoring, and Thyroid cancer ([facility]). Also  has a past surgical history that includes Tonsillectomy (1982); Dilation and curettage of uterus (2004); Thyroidectomy (02/2009); Breast biopsy (2000); Breast biopsy (Left, 01/22/2013); Breast lumpectomy with needle localization and axillary sentinel lymph node bx (Left, 09/30/2015); Breast reconstruction (Bilateral, 10/12/2015); Breast reduction surgery (Bilateral, 10/12/2015); Excision of breast lesion (Left, 10/12/2015); Breast lumpectomy (Left, 01/06/2013); Breast lumpectomy (Left, 2017); and Reduction mammaplasty (Bilateral, 09/2015).   Objective:   Vitals: BP 115/80 (BP Location: Right Arm, Patient Position: Sitting, Cuff Size: Large)   Pulse 95   Temp 98 F (36.7 C) (Oral)   Resp 18   Wt 235 lb (106.6 kg)   SpO2 97%   BMI 40.34 kg/m   Physical Exam Vitals signs reviewed.  Constitutional:      General: She is not in acute distress.    Appearance: She is well-developed. She is not ill-appearing or toxic-appearing.  HENT:     Head: Normocephalic and atraumatic.     Right Ear: Ear canal and external ear normal. A middle ear effusion (mild) is present. Tympanic membrane is not erythematous or bulging.     Left Ear: Ear canal and external ear normal. A middle ear effusion (mild) is present. Tympanic membrane is not erythematous or bulging.     Nose: Mucosal edema and congestion present.     Right Sinus: No maxillary sinus tenderness or frontal sinus tenderness.     Left Sinus: No maxillary sinus tenderness or frontal sinus tenderness.     Mouth/Throat:     Lips: Pink.     Mouth: Mucous membranes are moist.     Pharynx: Uvula midline. Posterior oropharyngeal erythema (mild) present.     Tonsils: No tonsillar exudate or tonsillar abscesses. Swelling: 0 on the right. 0 on the left.     Comments: S/p tonsillectomy  Eyes:  Conjunctiva/sclera: Conjunctivae normal.  Neck:      Musculoskeletal: Normal range of motion.  Cardiovascular:     Rate and Rhythm: Normal rate and regular rhythm.     Heart sounds: Normal heart sounds.  Pulmonary:     Effort: Pulmonary effort is normal. No tachypnea, accessory muscle usage or respiratory distress.     Breath sounds: Normal breath sounds. No decreased breath sounds, wheezing, rhonchi or rales.  Lymphadenopathy:     Head:     Right side of head: No submental, submandibular, tonsillar, preauricular, posterior auricular or occipital adenopathy.     Left side of head: No submental, submandibular, tonsillar, preauricular, posterior auricular or occipital adenopathy.     Cervical: No cervical adenopathy.     Upper Body:     Right upper body: No supraclavicular adenopathy.     Left upper body: No supraclavicular adenopathy.  Skin:    General: Skin is warm and dry.  Neurological:     Mental Status: She is alert.     No results found for this or any previous visit (from the past 24 hour(s)).  Assessment and Plan :  1. Viral URI Patient is overall well-appearing, no acute distress.  VSS.  This is consistent with viral URI.  Symptoms have only been present for 2 days.  Would recommend conservative supportive treatment at this time.  Exam findings, diagnosis etiology, medication use, indications, side effects, risks, benefits, and alternatives of the medications and treatment plan prescribed today and reviewed with patient. Follow- Up and discharge instructions provided. No emergent/urgent issues found on exam. Patient education was provided. Patient verbalized understanding of information provided and agrees with plan of care (POC), all questions answered. No barriers to understanding were identified. Red flags discussed in detail. The patient is advised to call or return to clinic if condition does not see an improvement in sinus pressure over the next 3 to 5 days, could consider Rx for oral antibiotic at that time to cover for  bacterial etiology for sinusitis. Encouraged to seek the care of the closest emergency department if condition worsens/develop new concerning symptoms with the above plan.  - brompheniramine-pseudoephedrine-DM 30-2-10 MG/5ML syrup; Take 5 mLs by mouth 3 (three) times daily as needed.  Dispense: 120 mL; Refill: 0 - fluticasone (FLONASE) 50 MCG/ACT nasal spray; Place 2 sprays into both nostrils daily.  Dispense: 16 g; Refill: 0 - sodium chloride (OCEAN) 0.65 % SOLN nasal spray; Place 1 spray into both nostrils as needed for congestion.  Dispense: 60 mL; Refill: 0   Tenna Delaine, [employer] 02/12/2018 11:44 AM

## 2018-02-12 NOTE — Patient Instructions (Signed)
Upper Respiratory Infection, Adult  Upper Respiratory Infection, Adult  For cough: start bromfed syrup . You can use over the counter mucinex but do not use mucinex-dm with the other prescribed medications. Mucinex will make you cough more. bromfed syrup can increase your blood pressure, so keep an eye on this. Discontinue if bp >140/90.   For nasal congestion: use nasal saline spray at night and flonase in the morning. The bromfed syrup will also help with congestion.  For sore throat : try over the counter throat lozenges. You can also do ginger tumeric herbal tea from [employer], add 2 tbsp of honey to the tea. At home tea recipe for sore throat: boil water, add 2 inches shaved ginger root, steep 15 minutes, add juice from 2 full lemons, and 2 tbsp honey. You can also use over the counter tylenol or ibuprofen as prescribed.   If no improvement in sinus pressure in 3-5 days or symptoms worsen, please contact our office as you may need antibiotics at this time.   If your cough persists after 1 week, please follow up with family doctor. If your cough becomes more productive and you develop a fever or shortness of breath while sitting or chest pain or any other concerning symptoms, please seek care immediately at local urgent care or ED.   An upper respiratory infection (URI) affects the nose, throat, and upper air passages. URIs are caused by germs (viruses). The most common type of URI is often called "the common cold." Medicines cannot cure URIs, but you can do things at home to relieve your symptoms. URIs usually get better within 7-10 days. Follow these instructions at home: Activity  Rest as needed.  If you have a fever, stay home from work or school until your fever is gone, or until your doctor says you may return to work or school. ? You should stay home until you cannot spread the infection anymore (you are not contagious). ? Your doctor may have you wear a face mask so you have  less risk of spreading the infection. Relieving symptoms  Gargle with a salt-water mixture 3-4 times a day or as needed. To make a salt-water mixture, completely dissolve -1 tsp of salt in 1 cup of warm water.  Use a cool-mist humidifier to add moisture to the air. This can help you breathe more easily. Eating and drinking   Drink enough fluid to keep your pee (urine) pale yellow.  Eat soups and other clear broths. General instructions   Take over-the-counter and prescription medicines only as told by your doctor. These include cold medicines, fever reducers, and cough suppressants.  Do not use any products that contain nicotine or tobacco. These include cigarettes and e-cigarettes. If you need help quitting, ask your doctor.  Avoid being where people are smoking (avoid secondhand smoke).  Make sure you get regular shots and get the flu shot every year.  Keep all follow-up visits as told by your doctor. This is important. How to avoid spreading infection to others   Wash your hands often with soap and water. If you do not have soap and water, use hand sanitizer.  Avoid touching your mouth, face, eyes, or nose.  Cough or sneeze into a tissue or your sleeve or elbow. Do not cough or sneeze into your hand or into the air. Contact a doctor if:  You are getting worse, not better.  You have any of these: ? A fever. ? Chills. ? Brown or red  mucus in your nose. ? Yellow or brown fluid (discharge)coming from your nose. ? Pain in your face, especially when you bend forward. ? Swollen neck glands. ? Pain with swallowing. ? White areas in the back of your throat. Get help right away if:  You have shortness of breath that gets worse.  You have very bad or constant: ? Headache. ? Ear pain. ? Pain in your forehead, behind your eyes, and over your cheekbones (sinus pain). ? Chest pain.  You have long-lasting (chronic) lung disease along with any of  these: ? Wheezing. ? Long-lasting cough. ? Coughing up blood. ? A change in your usual mucus.  You have a stiff neck.  You have changes in your: ? Vision. ? Hearing. ? Thinking. ? Mood. Summary  An upper respiratory infection (URI) is caused by a germ called a virus. The most common type of URI is often called "the common cold."  URIs usually get better within 7-10 days.  Take over-the-counter and prescription medicines only as told by your doctor. This information is not intended to replace advice given to you by your health care provider. Make sure you discuss any questions you have with your health care provider. Document Released: 06/28/2007 Document Revised: 09/01/2016 Document Reviewed: 09/01/2016 Elsevier Interactive Patient Education  2019 [employer].

## 2018-02-13 MED FILL — metFORMIN HCL 1000 MG TABS: 1000 | 90 days supply | Qty: 180 | Fill #0

## 2018-02-25 MED FILL — PREGABALIN 75 MG CAPS: 75 | 15 days supply | Qty: 30 | Fill #2

## 2018-02-25 MED FILL — PANTOPRAZOLE SOD DR 40 MG T: 40 | 90 days supply | Qty: 90 | Fill #1

## 2018-02-25 MED FILL — TRULICITY 1.5 MG/0.5 ML PEN: 1.5 | 28 days supply | Qty: 2 | Fill #2

## 2018-02-25 MED FILL — LANTUS SOLOSTAR 100 UNITS/M: 100 | 47 days supply | Qty: 30 | Fill #1

## 2018-03-05 ENCOUNTER — Ambulatory Visit: Payer: No Typology Code available for payment source | Admitting: Endocrinology

## 2018-03-12 MED FILL — TAMOXIFEN CIT 20 MG TABLET: 20 | 90 days supply | Qty: 90 | Fill #2

## 2018-03-18 MED FILL — ACETAMINOPHEN/COD #3 TABLET: 300-30 | 4 days supply | Qty: 16 | Fill #0

## 2018-03-18 MED FILL — IBUPROFEN 800 MG TABS: 800 | 4 days supply | Qty: 16 | Fill #0

## 2018-03-22 ENCOUNTER — Encounter: Payer: Self-pay | Admitting: Endocrinology

## 2018-03-22 ENCOUNTER — Ambulatory Visit (INDEPENDENT_AMBULATORY_CARE_PROVIDER_SITE_OTHER): Payer: No Typology Code available for payment source | Admitting: Endocrinology

## 2018-03-22 VITALS — BP 150/88 | HR 118 | Ht 64.0 in | Wt 237.8 lb

## 2018-03-22 DIAGNOSIS — E1142 Type 2 diabetes mellitus with diabetic polyneuropathy: Secondary | ICD-10-CM

## 2018-03-22 DIAGNOSIS — E890 Postprocedural hypothyroidism: Secondary | ICD-10-CM

## 2018-03-22 DIAGNOSIS — Z794 Long term (current) use of insulin: Secondary | ICD-10-CM | POA: Diagnosis not present

## 2018-03-22 DIAGNOSIS — C73 Malignant neoplasm of thyroid gland: Secondary | ICD-10-CM

## 2018-03-22 LAB — POCT GLYCOSYLATED HEMOGLOBIN (HGB A1C): Hemoglobin A1C: 8.4 % — AB (ref 4.0–5.6)

## 2018-03-22 LAB — TSH: TSH: 0.87 u[IU]/mL (ref 0.35–4.50)

## 2018-03-22 MED ORDER — INSULIN GLARGINE 100 UNIT/ML SOLOSTAR PEN: 75.0000 [IU] | PEN_INJECTOR | SUBCUTANEOUS | 11 refills | Status: DC

## 2018-03-22 NOTE — Progress Notes (Signed)
Subjective:    Patient ID: Stacey Roberts, female    DOB: Jan 18, 1966, 53 y.o.   MRN: [id number]  HPI Pt has stage-1 papillary adenocarcinoma of the thyroid.    2/11: thyroidectomy: T1b N0 M0.  4/11: RAI 103 mCi, with thyrogen.  12/11: neck US: single enlarged right cervical lymph node, nonspecific.   6/12: body scan (thyrogen) neg.   12/12: [place]: lymph node is stable.  6/13: [place]: overall node morphology and volume show very little change.   9/14  TG undetectable (ab neg) 3/15: TG undetectable (ab neg) 10/15 TG undetectable (ab neg).  3/17 TG undetectable (ab neg).   4/19 TG undetectable (ab neg).    Pt returns for f/u of diabetes mellitus:  DM type: Insulin-requiring type 2 Dx'ed: 1423 Complications: painful neuropathy of the lower extremities.  Therapy: insulin since early 9532 (also trulicity and 2 oral meds).   GDM: never.  DKA: never.   Severe hypoglycemia: never.   Pancreatitis: never.  Other: she declines multiple daily injections; edema precudes pioglitizone rx.  She declines bariatric surgery for now.  She declines to add bromocriptine.  Interval history: She never misses the insulin.  no cbg record, but states cbg varies from 110-190.  There is no trend throughout the day.  pt states she feels well in general.   Past Medical History:  Diagnosis Date  . Allergy    seasonal  . Breast cancer ([place]) 2014  . Breast cancer ([place]) 2017  . Bronchitis    hx of  . Diabetes mellitus, type II ([place])   . GERD (gastroesophageal reflux disease)   . Headache   . History of radiation therapy 12/01/15-01/11/16   Left breast DIBH / 50.4 Gy in 28 fractions and Left breast boost / 12 Gy in 6 fractions  . Hyperlipidemia   . Hypertension   . Hypothyroidism   . Joint pain   . Obesity   . PCOS (polycystic ovarian syndrome)   . Personal history of radiation therapy 2017  . Pneumonia    as a child  . Sleep apnea 2008   severe OSA-does not use a cpap  . Snoring   . 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 Buddy Duty    Past Surgical History:  Procedure Laterality Date  . BREAST BIOPSY  2000   s/p  . BREAST BIOPSY Left 01/22/2013   Procedure: RE-EXCISION LEFT BREAST DCIS;  Surgeon: Harl Bowie, MD;  Location: [facility];  Service: General;  Laterality: Left;  . BREAST LUMPECTOMY Left 01/06/2013   Procedure: LUMPECTOMY;  Surgeon: Harl Bowie, MD;  Location: [facility];  Service: General;  Laterality: Left;  . BREAST LUMPECTOMY Left 2017  . BREAST LUMPECTOMY WITH NEEDLE LOCALIZATION AND AXILLARY SENTINEL LYMPH NODE BX Left 09/30/2015   Procedure: Left BREAST LUMPECTOMY WITH NEEDLE LOCALIZATION AND AXILLARY SENTINEL LYMPH NODE BX;  Surgeon: Coralie Keens, MD;  Location: [facility];  Service: General;  Laterality: Left;  . BREAST RECONSTRUCTION Bilateral 10/12/2015   Procedure: BREAST oncoplastic RECONSTRUCTION;  Surgeon: Irene Limbo, MD;  Location: [facility];  Service: Plastics;  Laterality: Bilateral;  . BREAST REDUCTION SURGERY Bilateral 10/12/2015   Procedure: MAMMARY REDUCTION  (BREAST);  Surgeon: Irene Limbo, MD;  Location: [facility];  Service: Plastics;  Laterality: Bilateral;  . DILATION AND CURETTAGE OF UTERUS  2004   s/p  . EXCISION OF  BREAST LESION Left 10/12/2015   Procedure: Re-EXCISION OF BREAST;  Surgeon: Coralie Keens, MD;  Location: [facility];  Service: General;  Laterality: Left;  . REDUCTION MAMMAPLASTY Bilateral 09/2015  . THYROIDECTOMY  02/2009   Dr Harlow Asa  . TONSILLECTOMY  1982    Social History   Socioeconomic History  . Marital status: Single    Spouse name: Not on file  . Number of children: Not on file  . Years of education: Not on file  . Highest education level: Not on file  Occupational History    Employer: [employer]    Comment: Outpatient scheduling in  radiology  Social Needs  . Financial resource strain: Not on file  . Food insecurity:    Worry: Not on file    Inability: Not on file  . Transportation needs:    Medical: Not on file    Non-medical: Not on file  Tobacco Use  . Smoking status: Former Smoker    Packs/day: 0.50    Types: Cigarettes    Last attempt to quit: 09/29/2007    Years since quitting: 10.4  . Smokeless tobacco: Never Used  . Tobacco comment: Smoked on and off for 20 years. Quit about 8 years ago (as of 08/2015)  Substance and Sexual Activity  . Alcohol use: Yes    Alcohol/week: 0.0 standard drinks    Comment: occasionally  . Drug use: No  . Sexual activity: Yes  Lifestyle  . Physical activity:    Days per week: Not on file    Minutes per session: Not on file  . Stress: Not on file  Relationships  . Social connections:    Talks on phone: Not on file    Gets together: Not on file    Attends religious service: Not on file    Active member of club or organization: Not on file    Attends meetings of clubs or organizations: Not on file    Relationship status: Not on file  . Intimate partner violence:    Fear of current or ex partner: Not on file    Emotionally abused: Not on file    Physically abused: Not on file    Forced sexual activity: Not on file  Other Topics Concern  . Not on file  Social History Narrative   The patient is divorced, moved to [place] in   2006 from [place].  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    No alcohol.  Tobacco use:  She quit 5 years ago.  She smoked on   and off for approximately 20 years.  No history of recreational drug   Use.Drinks two cups of coffee per work day.     Current Outpatient Medications on File Prior to Visit  Medication Sig Dispense Refill  . Cholecalciferol (EQL VITAMIN D3) 2000 units CAPS Take 2,000 Units daily by mouth.    . dapagliflozin propanediol  (FARXIGA) 5 MG TABS tablet Take 5 mg by mouth daily. 90 tablet 3  . Dulaglutide (TRULICITY) 1.5 OV/7.8HY SOPN Inject 1.5 mg into the skin once a week. 4 pen 11  . fluticasone (FLONASE) 50 MCG/ACT nasal spray Place 2 sprays into both nostrils daily. 16 g 0  . gabapentin (NEURONTIN) 300 MG capsule Take 1 capsule (300 mg total) by mouth at bedtime. 90 capsule 4  . glucose blood (ACCU-CHEK GUIDE) test strip 1  each by Other route 2 (two) times daily. Used to check blood sugars 1x daily. 180 each 3  . levothyroxine (SYNTHROID, LEVOTHROID) 150 MCG tablet Take 1 tablet (150 mcg total) by mouth daily. 90 tablet 3  . losartan (COZAAR) 50 MG tablet TAKE 1 TABLET BY MOUTH ONCE DAILY 90 tablet 2  . metFORMIN (GLUCOPHAGE) 1000 MG tablet TAKE 1 TABLET (1,000 MG TOTAL) BY MOUTH 2 (TWO) TIMES DAILY WITH A MEAL. 180 tablet 3  . mupirocin ointment (BACTROBAN) 2 % Apply to wound after soaking BID 30 g 1  . pantoprazole (PROTONIX) 40 MG tablet TAKE 1 TABLET (40 MG TOTAL) BY MOUTH DAILY. 90 tablet 2  . pregabalin (LYRICA) 75 MG capsule TAKE 1 CAPSULE BY MOUTH TWICE DAILY 60 capsule 3  . rosuvastatin (CRESTOR) 10 MG tablet TAKE 1 TABLET (10 MG TOTAL) BY MOUTH DAILY. 90 tablet 3  . sodium chloride (OCEAN) 0.65 % SOLN nasal spray Place 1 spray into both nostrils as needed for congestion. 60 mL 0  . tamoxifen (NOLVADEX) 20 MG tablet Take 20 mg daily by mouth.   10  . [DISCONTINUED] Calcium Carbonate 1500 MG TABS Take 1,500 mg by mouth daily.       No current facility-administered medications on file prior to visit.     Allergies  Allergen Reactions  . Penicillins Shortness Of Breath, Rash and Other (See Comments)    Has patient had a PCN reaction causing immediate rash, facial/tongue/throat swelling, SOB or lightheadedness with hypotension: Yes Has patient had a PCN reaction causing severe rash involving mucus membranes or skin necrosis: Yes Has patient had a PCN reaction that required hospitalization No Has patient  had a PCN reaction occurring within the last 10 years: Yes If all of the above answers are "NO", then may proceed with Cephalosporin use.   . Clindamycin/Lincomycin Other (See Comments)    Severe stomach cramps  . Ace Inhibitors Other (See Comments) and Cough    REACTION: cough; denies airway involvement     Family History  Problem Relation Age of Onset  . Dementia Father        deceased age 31 secondary to dementia  . Bipolar disorder Father   . Emphysema Father   . Heart disease Father   . COPD Father        smoker  . Hypertension Father   . Depression Father   . Alcoholism Father   . CAD Mother 50       Died age 13 of CAD  . Heart disease Mother   . Graves' disease Mother   . Hypertension Mother   . Thyroid disease Mother   . CAD Maternal Grandfather 60  . Heart disease Maternal Grandfather   . Other Sister 61       hysterectomy for fibroids  . Prostate cancer Brother        low grade; w/ surveillance  . Thyroid nodules Brother 18  . Fibroids Other        niece dx approx 53  . Hypertension Other   . Kidney cancer Cousin        maternal 1st cousin dx 27-47; former smoker  . Lung cancer Other        maternal great aunt (MGM's sister); not a smoker  . Cancer Other        nephew dx neuroblastoma at 8.18 years old  . Colon cancer Neg Hx   . Colon polyps Neg Hx     BP (!) 150/88 (  BP Location: Right Arm, Patient Position: Sitting, Cuff Size: Large)   Pulse (!) 118   Ht '5\' 4"'  (1.626 m)   Wt 237 lb 12.8 oz (107.9 kg)   SpO2 97%   BMI 40.82 kg/m    Review of Systems She denies hypoglycemia.     Objective:   Physical Exam VITAL SIGNS:  See vs page GENERAL: no distress Pulses: dorsalis pedis intact bilat.   MSK: no deformity of the feet CV: trace bilat leg edema Skin:  no ulcer on the feet.  normal color and temp on the feet. Neuro: sensation is intact to touch on the feet    A1c=8.4%    Assessment & Plan:  HTN: is noted today Insulin-requiring type 2  DM, with PN: worse.  PTC: due for recheck.    Patient Instructions  Your blood pressure is high today.  Please see your primary care provider soon, to have it rechecked I have sent a prescription to your pharmacy, to increase the lantus, and:  Please continue the same other diabetes medications. check your blood sugar twice a day.  vary the time of day when you check, between before the 3 meals, and at bedtime.  also check if you have symptoms of your blood sugar being too high or too low.  please keep a record of the readings and bring it to your next appointment here (or you can bring the meter itself).  You can write it on any piece of paper.  please call us sooner if your blood sugar goes below 70, or if you have a lot of readings over 200.  Let's recheck the ultrasound.  you will receive a phone call, about a day and time for an appointment. Blood tests are requested for you today.  We'll let you know about the results.   Please come back for a follow-up appointment in 2 months.

## 2018-03-22 NOTE — Patient Instructions (Addendum)
Your blood pressure is high today.  Please see your primary care provider soon, to have it rechecked I have sent a prescription to your pharmacy, to increase the lantus, and:  Please continue the same other diabetes medications. check your blood sugar twice a day.  vary the time of day when you check, between before the 3 meals, and at bedtime.  also check if you have symptoms of your blood sugar being too high or too low.  please keep a record of the readings and bring it to your next appointment here (or you can bring the meter itself).  You can write it on any piece of paper.  please call us sooner if your blood sugar goes below 70, or if you have a lot of readings over 200.  Let's recheck the ultrasound.  you will receive a phone call, about a day and time for an appointment. Blood tests are requested for you today.  We'll let you know about the results.   Please come back for a follow-up appointment in 2 months.

## 2018-03-25 LAB — THYROGLOBULIN LEVEL: Thyroglobulin: <0.1 ng/mL — ABNORMAL LOW

## 2018-03-25 LAB — THYROGLOBULIN ANTIBODY: Thyroglobulin Ab: <1 IU/mL (ref <=1)

## 2018-04-03 MED FILL — PREGABALIN 75 MG CAPS: 75 | 15 days supply | Qty: 30 | Fill #3

## 2018-04-03 MED FILL — GABAPENTIN 300 MG CAPSULE: 300 | 90 days supply | Qty: 90 | Fill #1

## 2018-04-04 MED FILL — LANTUS SOLOSTAR 100 UNITS/M: 100 | 47 days supply | Qty: 30 | Fill #2

## 2018-04-10 MED FILL — LEVOTHYROXINE 150 MCG TAB: 150 | 90 days supply | Qty: 90 | Fill #1

## 2018-04-10 MED FILL — FARXIGA 5 MG TABLET: 5 | 90 days supply | Qty: 90 | Fill #1

## 2018-04-10 MED FILL — LOSARTAN POTASSIUM 50 MG TA: 50 | 90 days supply | Qty: 90 | Fill #1

## 2018-04-23 ENCOUNTER — Other Ambulatory Visit: Payer: No Typology Code available for payment source

## 2018-05-08 ENCOUNTER — Other Ambulatory Visit: Payer: Self-pay | Admitting: Endocrinology

## 2018-05-08 ENCOUNTER — Other Ambulatory Visit: Payer: Self-pay | Admitting: Podiatry

## 2018-05-08 MED FILL — metFORMIN HCL 1000 MG TABS: 1000 | 90 days supply | Qty: 180 | Fill #1

## 2018-05-08 MED FILL — ACCU-CHEK GUIDE TEST STRIP: 90 days supply | Qty: 100 | Fill #0

## 2018-05-08 MED FILL — LANTUS SOLOSTAR 100 UNITS/M: 100 | 47 days supply | Qty: 30 | Fill #3

## 2018-05-10 MED ORDER — PREGABALIN 75 MG PO CAPS: 75.0000 mg | ORAL_CAPSULE | Freq: Two times a day (BID) | ORAL | 3 refills | Status: DC

## 2018-05-10 MED FILL — PREGABALIN 75 MG CAPS: 75 | 30 days supply | Qty: 60 | Fill #0

## 2018-05-10 NOTE — Telephone Encounter (Signed)
Left message for refill lyrica.

## 2018-05-30 ENCOUNTER — Other Ambulatory Visit: Payer: Self-pay | Admitting: Pharmacist

## 2018-05-30 MED FILL — PANTOPRAZOLE SOD DR 40 MG T: 40 | 90 days supply | Qty: 90 | Fill #2

## 2018-06-10 ENCOUNTER — Other Ambulatory Visit: Payer: Self-pay | Admitting: Oncology

## 2018-06-10 MED FILL — TAMOXIFEN CIT 20 MG TABLET: 20 | 90 days supply | Qty: 90 | Fill #0

## 2018-06-10 MED FILL — GABAPENTIN 300 MG CAPSULE: 300 | 90 days supply | Qty: 90 | Fill #2

## 2018-06-19 ENCOUNTER — Telehealth: Payer: Self-pay | Admitting: Oncology

## 2018-06-19 NOTE — Telephone Encounter (Signed)
[facility] 6/24 moved appointment to 6/26, left message. Schedule mailed.

## 2018-06-27 ENCOUNTER — Ambulatory Visit (INDEPENDENT_AMBULATORY_CARE_PROVIDER_SITE_OTHER): Payer: No Typology Code available for payment source | Admitting: Endocrinology

## 2018-06-27 DIAGNOSIS — E669 Obesity, unspecified: Secondary | ICD-10-CM

## 2018-06-27 DIAGNOSIS — E1142 Type 2 diabetes mellitus with diabetic polyneuropathy: Secondary | ICD-10-CM

## 2018-06-27 DIAGNOSIS — Z794 Long term (current) use of insulin: Secondary | ICD-10-CM

## 2018-06-27 NOTE — Progress Notes (Signed)
Subjective:    Patient ID: Stacey Roberts, female    DOB: [birth year], 53 y.o.   MRN: [id number]  HPI telehealth visit today via phone x 8 minutes Alternatives to telehealth are presented to this patient, and the patient agrees to the telehealth visit.  Pt is advised of the cost of the visit, and agrees to this, also.   Patient is in her car, and I am at the office.   Persons attending the telehealth visit: the patient and I Pt has stage-1 papillary adenocarcinoma of the thyroid.    2/11: thyroidectomy: T1b N0 M0.  4/11: RAI 103 mCi, with thyrogen.  12/11: neck US: single enlarged right cervical lymph node, nonspecific.   6/12: body scan (thyrogen) neg.   12/12: [place]: lymph node is stable.  6/13: [place]: overall node morphology and volume show very little change.   9/14  TG undetectable (ab neg) 3/15: TG undetectable (ab neg) 10/15 TG undetectable (ab neg).  3/17 TG undetectable (ab neg).   4/19 TG undetectable (ab neg).   Denies neck swelling Pt returns for f/u of diabetes mellitus:  DM type: Insulin-requiring type 2 Dx'ed: 4854 Complications: painful neuropathy of the lower extremities.  Therapy: insulin since early 6270 (also trulicity and 2 oral meds).   GDM: never.  DKA: never.   Severe hypoglycemia: never.   Pancreatitis: never.  Other: she declines multiple daily injections; edema precudes pioglitizone rx.  She declines bariatric surgery for now.  She declines to add bromocriptine.  Interval history: She never misses meds.  no cbg record, but states cbg varies from 115-200.  There is no trend throughout the day.  pt states she feels well in general.   Past Medical History:  Diagnosis Date  . Allergy    seasonal  . Breast cancer ([place]) 2014  . Breast cancer ([place]) 2017  . Bronchitis    hx of  . Diabetes mellitus, type II ([place])   . GERD (gastroesophageal reflux disease)   . Headache   . History of radiation therapy 12/01/15-01/11/16   Left breast DIBH / 50.4 Gy in 28  fractions and Left breast boost / 12 Gy in 6 fractions  . Hyperlipidemia   . Hypertension   . Hypothyroidism   . Joint pain   . Obesity   . PCOS (polycystic ovarian syndrome)   . Personal history of radiation therapy 2017  . Pneumonia    as a child  . Sleep apnea 2008   severe OSA-does not use a cpap  . Snoring   . 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 Buddy Duty    Past Surgical History:  Procedure Laterality Date  . BREAST BIOPSY  2000   s/p  . BREAST BIOPSY Left 01/22/2013   Procedure: RE-EXCISION LEFT BREAST DCIS;  Surgeon: Harl Bowie, MD;  Location: [facility];  Service: General;  Laterality: Left;  . BREAST LUMPECTOMY Left 01/06/2013   Procedure: LUMPECTOMY;  Surgeon: Harl Bowie, MD;  Location: [facility];  Service: General;  Laterality: Left;  . BREAST LUMPECTOMY Left 2017  . BREAST LUMPECTOMY WITH NEEDLE LOCALIZATION AND AXILLARY SENTINEL LYMPH NODE BX Left 09/30/2015   Procedure: Left BREAST LUMPECTOMY WITH NEEDLE LOCALIZATION AND AXILLARY SENTINEL LYMPH NODE BX;  Surgeon: Coralie Keens, MD;  Location: [facility];  Service: General;  Laterality: Left;  . BREAST RECONSTRUCTION Bilateral 10/12/2015   Procedure: BREAST oncoplastic RECONSTRUCTION;  Surgeon: Irene Limbo, MD;  Location: [facility];  Service: Plastics;  Laterality: Bilateral;  . BREAST REDUCTION SURGERY Bilateral 10/12/2015   Procedure: MAMMARY REDUCTION  (BREAST);  Surgeon: Irene Limbo, MD;  Location: [facility];  Service: Plastics;  Laterality: Bilateral;  . DILATION AND CURETTAGE OF UTERUS  2004   s/p  . EXCISION OF BREAST LESION Left 10/12/2015   Procedure: Re-EXCISION OF BREAST;  Surgeon: Coralie Keens, MD;  Location: [facility];  Service: General;  Laterality: Left;  . REDUCTION MAMMAPLASTY Bilateral 09/2015  . THYROIDECTOMY   02/2009   Dr Harlow Asa  . TONSILLECTOMY  1982    Social History   Socioeconomic History  . Marital status: Single    Spouse name: Not on file  . Number of children: Not on file  . Years of education: Not on file  . Highest education level: Not on file  Occupational History    Employer: [employer]    Comment: Outpatient scheduling in radiology  Social Needs  . Financial resource strain: Not on file  . Food insecurity:    Worry: Not on file    Inability: Not on file  . Transportation needs:    Medical: Not on file    Non-medical: Not on file  Tobacco Use  . Smoking status: Former Smoker    Packs/day: 0.50    Types: Cigarettes    Last attempt to quit: 09/29/2007    Years since quitting: 10.7  . Smokeless tobacco: Never Used  . Tobacco comment: Smoked on and off for 20 years. Quit about 8 years ago (as of 08/2015)  Substance and Sexual Activity  . Alcohol use: Yes    Alcohol/week: 0.0 standard drinks    Comment: occasionally  . Drug use: No  . Sexual activity: Yes  Lifestyle  . Physical activity:    Days per week: Not on file    Minutes per session: Not on file  . Stress: Not on file  Relationships  . Social connections:    Talks on phone: Not on file    Gets together: Not on file    Attends religious service: Not on file    Active member of club or organization: Not on file    Attends meetings of clubs or organizations: Not on file    Relationship status: Not on file  . Intimate partner violence:    Fear of current or ex partner: Not on file    Emotionally abused: Not on file    Physically abused: Not on file    Forced sexual activity: Not on file  Other Topics Concern  . Not on file  Social History Narrative   The patient is divorced, moved to [place] in   2006 from [place].  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    No alcohol.  Tobacco use:  She quit 5 years ago.   She smoked on   and off for approximately 20 years.  No history of recreational drug   Use.Drinks two cups of coffee per work day.     Current Outpatient Medications on File Prior to Visit  Medication Sig Dispense Refill  . ACCU-CHEK GUIDE test strip USED TO CHECK BLOOD SUGARS ONCE DAILY. 100 each 2  . Cholecalciferol (EQL VITAMIN D3) 2000 units CAPS Take 2,000 Units daily by mouth.    . Dulaglutide (TRULICITY) 1.5  MG/0.5ML SOPN Inject 1.5 mg into the skin once a week. 4 pen 11  . fluticasone (FLONASE) 50 MCG/ACT nasal spray Place 2 sprays into both nostrils daily. 16 g 0  . gabapentin (NEURONTIN) 300 MG capsule Take 1 capsule (300 mg total) by mouth at bedtime. 90 capsule 4  . Insulin Glargine (LANTUS SOLOSTAR) 100 UNIT/ML Solostar Pen Inject 75 Units into the skin every morning. And pen needles 1/day 30 pen 11  . levothyroxine (SYNTHROID, LEVOTHROID) 150 MCG tablet Take 1 tablet (150 mcg total) by mouth daily. 90 tablet 3  . losartan (COZAAR) 50 MG tablet TAKE 1 TABLET BY MOUTH ONCE DAILY 90 tablet 2  . metFORMIN (GLUCOPHAGE) 1000 MG tablet TAKE 1 TABLET (1,000 MG TOTAL) BY MOUTH 2 (TWO) TIMES DAILY WITH A MEAL. 180 tablet 3  . mupirocin ointment (BACTROBAN) 2 % Apply to wound after soaking BID 30 g 1  . pantoprazole (PROTONIX) 40 MG tablet TAKE 1 TABLET (40 MG TOTAL) BY MOUTH DAILY. 90 tablet 2  . pregabalin (LYRICA) 75 MG capsule Take 1 capsule (75 mg total) by mouth 2 (two) times daily. 60 capsule 3  . rosuvastatin (CRESTOR) 10 MG tablet TAKE 1 TABLET (10 MG TOTAL) BY MOUTH DAILY. 90 tablet 3  . sodium chloride (OCEAN) 0.65 % SOLN nasal spray Place 1 spray into both nostrils as needed for congestion. 60 mL 0  . tamoxifen (NOLVADEX) 20 MG tablet TAKE 1 TABLET BY MOUTH DAILY 90 tablet 0  . [DISCONTINUED] Calcium Carbonate 1500 MG TABS Take 1,500 mg by mouth daily.       No current facility-administered medications on file prior to visit.     Allergies  Allergen Reactions  .  Penicillins Shortness Of Breath, Rash and Other (See Comments)    Has patient had a PCN reaction causing immediate rash, facial/tongue/throat swelling, SOB or lightheadedness with hypotension: Yes Has patient had a PCN reaction causing severe rash involving mucus membranes or skin necrosis: Yes Has patient had a PCN reaction that required hospitalization No Has patient had a PCN reaction occurring within the last 10 years: Yes If all of the above answers are "NO", then may proceed with Cephalosporin use.   . Clindamycin/Lincomycin Other (See Comments)    Severe stomach cramps  . Ace Inhibitors Other (See Comments) and Cough    REACTION: cough; denies airway involvement     Family History  Problem Relation Age of Onset  . Dementia Father        deceased age 1 secondary to dementia  . Bipolar disorder Father   . Emphysema Father   . Heart disease Father   . COPD Father        smoker  . Hypertension Father   . Depression Father   . Alcoholism Father   . CAD Mother 20       Died age 74 of CAD  . Heart disease Mother   . Graves' disease Mother   . Hypertension Mother   . Thyroid disease Mother   . CAD Maternal Grandfather 60  . Heart disease Maternal Grandfather   . Other Sister 32       hysterectomy for fibroids  . Prostate cancer Brother        low grade; w/ surveillance  . Thyroid nodules Brother 47  . Fibroids Other        niece dx approx 24  . Hypertension Other   . Kidney cancer Cousin        maternal  1st cousin dx 52-47; former smoker  . Lung cancer Other        maternal great aunt (MGM's sister); not a smoker  . Cancer Other        nephew dx neuroblastoma at 58.76 years old  . Colon cancer Neg Hx   . Colon polyps Neg Hx     There were no vitals taken for this visit.   Review of Systems She denies hypoglycemia.  She has gained weight.      Objective:   Physical Exam        Assessment & Plan:  Insulin-requiring type 2 DM:he declines a1c now.    Obesity: worse: I have sent a prescription to your pharmacy, to increase farxiga.   PTC: no symptomatic evidence of worsening.  Please do [place] as sched  Please come back for a follow-up appointment in 1-2 months.

## 2018-07-03 ENCOUNTER — Other Ambulatory Visit: Payer: Self-pay

## 2018-07-03 ENCOUNTER — Other Ambulatory Visit: Payer: Self-pay | Admitting: Endocrinology

## 2018-07-03 MED ORDER — DAPAGLIFLOZIN PROPANEDIOL 10 MG PO TABS: 10.0000 mg | ORAL_TABLET | Freq: Every day | ORAL | 3 refills | Status: DC

## 2018-07-03 MED ORDER — INSULIN GLARGINE 100 UNIT/ML SOLOSTAR PEN: 75.0000 [IU] | PEN_INJECTOR | SUBCUTANEOUS | 11 refills | Status: DC

## 2018-07-03 MED FILL — LANTUS SOLOSTAR 100 UNITS/M: 100 | 47 days supply | Qty: 30 | Fill #4

## 2018-07-03 MED FILL — FARXIGA 10 MG TABLET: 10 | 90 days supply | Qty: 90 | Fill #0

## 2018-07-03 MED FILL — LANTUS SOLOSTAR 100 UNITS/M: 100 | 40 days supply | Qty: 30 | Fill #0

## 2018-07-03 MED FILL — LEVOTHYROXINE 150 MCG TAB: 150 | 90 days supply | Qty: 90 | Fill #2

## 2018-07-03 MED FILL — ULTICARE PEN NDL 8MM 31G: 31G X 8 MM | 90 days supply | Qty: 100 | Fill #0

## 2018-07-03 MED FILL — LOSARTAN POTASSIUM 50 MG TA: 50 | 90 days supply | Qty: 90 | Fill #2

## 2018-07-04 MED FILL — TRULICITY 1.5 MG/0.5 ML PEN: 1.5 | 28 days supply | Qty: 2 | Fill #0

## 2018-07-08 ENCOUNTER — Telehealth: Payer: Self-pay | Admitting: Oncology

## 2018-07-08 NOTE — Telephone Encounter (Signed)
Called pt per 6/12 sch message - unable to reach pt - left message for patient to reschedule appt .

## 2018-07-09 MED FILL — PREGABALIN 75 MG CAPS: 75 | 30 days supply | Qty: 60 | Fill #1

## 2018-07-16 ENCOUNTER — Other Ambulatory Visit: Payer: No Typology Code available for payment source

## 2018-07-16 ENCOUNTER — Other Ambulatory Visit (HOSPITAL_COMMUNITY): Payer: No Typology Code available for payment source

## 2018-07-16 ENCOUNTER — Ambulatory Visit
Admission: RE | Admit: 2018-07-16 | Discharge: 2018-07-16 | Disposition: A | Discharge status: Home / Self Care | Payer: No Typology Code available for payment source | Source: Ambulatory Visit | Attending: Endocrinology | Admitting: Endocrinology

## 2018-07-16 DIAGNOSIS — C73 Malignant neoplasm of thyroid gland: Secondary | ICD-10-CM

## 2018-07-17 ENCOUNTER — Other Ambulatory Visit: Payer: No Typology Code available for payment source

## 2018-07-17 ENCOUNTER — Ambulatory Visit: Payer: No Typology Code available for payment source | Admitting: Oncology

## 2018-07-19 ENCOUNTER — Other Ambulatory Visit: Payer: No Typology Code available for payment source

## 2018-07-19 ENCOUNTER — Ambulatory Visit: Payer: No Typology Code available for payment source | Admitting: Oncology

## 2018-07-24 ENCOUNTER — Telehealth: Payer: Self-pay | Admitting: Oncology

## 2018-07-24 ENCOUNTER — Other Ambulatory Visit: Payer: Self-pay | Admitting: Oncology

## 2018-07-24 DIAGNOSIS — Z853 Personal history of malignant neoplasm of breast: Secondary | ICD-10-CM

## 2018-07-24 NOTE — Telephone Encounter (Signed)
Scheduled appt per 7/1 sch message- pt aware of appt date and time   

## 2018-07-29 ENCOUNTER — Other Ambulatory Visit: Payer: No Typology Code available for payment source

## 2018-07-29 ENCOUNTER — Ambulatory Visit: Payer: No Typology Code available for payment source | Admitting: Oncology

## 2018-08-12 MED FILL — metFORMIN HCL 1000 MG TABS: 1000 | 90 days supply | Qty: 180 | Fill #2

## 2018-08-12 MED FILL — LANTUS SOLOSTAR 100 UNITS/M: 100 | 40 days supply | Qty: 30 | Fill #1

## 2018-08-12 MED FILL — PREGABALIN 75 MG CAPS: 75 | 30 days supply | Qty: 60 | Fill #2

## 2018-08-13 ENCOUNTER — Other Ambulatory Visit: Payer: Self-pay | Admitting: *Deleted

## 2018-08-13 MED ORDER — GABAPENTIN 300 MG PO CAPS: 300.0000 mg | ORAL_CAPSULE | Freq: Every day | ORAL | 4 refills | Status: DC

## 2018-08-14 ENCOUNTER — Other Ambulatory Visit: Payer: Self-pay | Admitting: Adult Health

## 2018-08-14 DIAGNOSIS — Z853 Personal history of malignant neoplasm of breast: Secondary | ICD-10-CM

## 2018-08-15 ENCOUNTER — Other Ambulatory Visit: Payer: Self-pay

## 2018-08-15 ENCOUNTER — Ambulatory Visit
Admission: RE | Admit: 2018-08-15 | Discharge: 2018-08-15 | Disposition: A | Discharge status: Home / Self Care | Payer: No Typology Code available for payment source | Source: Ambulatory Visit | Attending: Oncology | Admitting: Oncology

## 2018-08-15 DIAGNOSIS — Z853 Personal history of malignant neoplasm of breast: Secondary | ICD-10-CM

## 2018-08-21 ENCOUNTER — Other Ambulatory Visit: Payer: No Typology Code available for payment source

## 2018-08-21 ENCOUNTER — Ambulatory Visit: Payer: No Typology Code available for payment source | Admitting: Oncology

## 2018-08-26 ENCOUNTER — Other Ambulatory Visit: Payer: Self-pay | Admitting: Internal Medicine

## 2018-08-27 MED FILL — PANTOPRAZOLE SOD DR 40 MG T: 40 | 90 days supply | Qty: 90 | Fill #0

## 2018-08-27 NOTE — Progress Notes (Signed)
[facility]  Telephone:([phone] Fax:([phone]     ID: Stacey Roberts DOB: 08-16-1965  MR#: [id number]  QIH#:[id number]  Patient Care Team: [name], NP as PCP - General (Internal Medicine) Fernando Torry, [name], MD as Consulting Physician (Oncology) [name], MD as Consulting Physician (General Surgery) [name], MD as Consulting Physician (Obstetrics and Gynecology) [name], MD as Consulting Physician (Radiation Oncology) [name], MD as Consulting Physician (Endocrinology) [name], MD as Consulting Physician (Gastroenterology) [name], MD as Consulting Physician (Plastic Surgery) OTHER MD:   CHIEF COMPLAINT: Estrogen receptor positive breast cancer  CURRENT TREATMENT: Tamoxifen   BREAST CANCER HISTORY: From the original intake note:  Stacey Roberts has a 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 She had left lumpectomy for that lesion 01/06/2013, showing a low-grade noninvasive ductal carcinoma which was estrogen receptor 99% and progesterone receptor 100% positive. Margins were focally positive and she had further excision 01/22/2013 which showed residual focal ductal carcinoma in situ 1 mm from the posterior margin. The patient refused adjuvant radiation and did not tolerate tamoxifen, which she took for approximately 2 months. She was last seen in our office in 2015.  Because of that history she receives diagnostic bilateral mammography yearly. Her 02/15/2015 study found the breast density to be category A. This study was negative. However in early August 2017 she developed a new left nipple discharge, which at times was bloody. Repeat left mammography with tomography and left breast ultrasonography 08/30/2015 now read the breast density to be category B. Aside some medial left breast masses and postlumpectomy findings, none of which showed any change since 2012, there were new  pleomorphic calcifications posterior to the lumpectomy area 1.4 cm. On exam there was no palpable lump. Ultrasound showed scarring behind the nipple which was unchanged from prior, but no definite mass.  Biopsy of the left breast upper outer quadrant area of calcifications 09/02/2015 showed ([id number]) ductal carcinoma in situ, grade 1. Focal invasion could not be excluded. There was not enough tissue for a prognostic panel.  On 09/10/2015 the patient underwent bilateral breast MRI with and without contrast. This again found the breast density to be category B. There was an area of non-masslike enhancement involving the upper outer quadrant of the left breast from the nipple posteriorly extending approximately 10 cm. The right breast was unremarkable. There was no evidence of adenopathy. MRI guided biopsy of the upper-outer quadrant retroareolar area of the left breast 09/16/2015 showed fat necrosis but no evidence of malignancy ([id number]).  Stacey Roberts's subsequent history is as detailed below   INTERVAL HISTORY: Stacey Roberts returns today for follow-up and treatment of her estrogen receptor positive breast cancer.  She continues on tamoxifen. She continues to have hot flashes with diaphoresis; they do happen at night as well.    Since her last visit here, she underwent a thyroid ultrasound on 07/16/2018 for a thyroid carcinoma post thyroidectomy showing: no discrete nodules are seen within the thyroid gland. No regional adenopathy on limited survey interrogation.  She also underwent a digital diagnostic bilateral mammogram with tomography on 08/15/2018 showing: Breast Density Category B. There is no mammographic evidence of malignancy.   She has an appointment on 08/30/2018 with her Optometrist.   REVIEW OF SYSTEMS: Stacey Roberts notes some mild agitation, which she believes could be due to work and social stress. She continues to work in office rather than at home, but she is in an  individual cubical. She is  taking appropriate precautions against the spread of the pandemic. She does walk sporatically for exercise, and she notes that she gets about 4,000 steps per day. She notes that she gets headaches everyday, but that it is likely due to an injury in her spine The patient denies unusual visual changes, nausea, vomiting, or dizziness. There has been no unusual cough, phlegm production, or pleurisy. This been no change in bowel or bladder habits. The patient denies unexplained fatigue or unexplained weight loss, bleeding, rash, or fever. A detailed review of systems was otherwise noncontributory.    PAST MEDICAL HISTORY: Past Medical History:  Diagnosis Date  . Allergy    seasonal  . Breast cancer (HCC) 2014  . Breast cancer (HCC) 2017  . Bronchitis    hx of  . Diabetes mellitus, type II (HCC)   . GERD (gastroesophageal reflux disease)   . Headache   . History of radiation therapy 12/01/15-01/11/16   Left breast DIBH / 50.4 Gy in 28 fractions and Left breast boost / 12 Gy in 6 fractions  . Hyperlipidemia   . Hypertension   . Hypothyroidism   . Joint pain   . Obesity   . PCOS (polycystic ovarian syndrome)   . Personal history of radiation therapy 2017  . Pneumonia    as a child  . Sleep apnea 2008   severe OSA-does not use a cpap  . Snoring   . 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 Sharl Ma    PAST SURGICAL HISTORY: Past Surgical History:  Procedure Laterality Date  . BREAST BIOPSY  2000   s/p  . BREAST BIOPSY Left 01/22/2013   Procedure: RE-EXCISION LEFT BREAST DCIS;  Surgeon: Shelly Rubenstein, MD;  Location: [facility];  Service: General;  Laterality: Left;  . BREAST LUMPECTOMY Left 01/06/2013   Procedure: LUMPECTOMY;  Surgeon: Shelly Rubenstein, MD;  Location: [facility] OR;  Service: General;  Laterality: Left;  . BREAST LUMPECTOMY Left 2017  . BREAST  LUMPECTOMY WITH NEEDLE LOCALIZATION AND AXILLARY SENTINEL LYMPH NODE BX Left 09/30/2015   Procedure: Left BREAST LUMPECTOMY WITH NEEDLE LOCALIZATION AND AXILLARY SENTINEL LYMPH NODE BX;  Surgeon: [name], MD;  Location: [facility] OR;  Service: General;  Laterality: Left;  . BREAST RECONSTRUCTION Bilateral 10/12/2015   Procedure: BREAST oncoplastic RECONSTRUCTION;  Surgeon: [name], MD;  Location: [facility];  Service: Plastics;  Laterality: Bilateral;  . BREAST REDUCTION SURGERY Bilateral 10/12/2015   Procedure: MAMMARY REDUCTION  (BREAST);  Surgeon: [name], MD;  Location: [facility];  Service: Plastics;  Laterality: Bilateral;  . DILATION AND CURETTAGE OF UTERUS  2004   s/p  . EXCISION OF BREAST LESION Left 10/12/2015   Procedure: Re-EXCISION OF BREAST;  Surgeon: [name], MD;  Location: [facility];  Service: General;  Laterality: Left;  . REDUCTION MAMMAPLASTY Bilateral 09/2015  . THYROIDECTOMY  02/2009   Dr Gerrit Friends  . TONSILLECTOMY  1982    FAMILY HISTORY Family History  Problem Relation Age of Onset  . Dementia Father        deceased age 64 secondary to dementia  . Bipolar disorder Father   . Emphysema Father   . Heart disease Father   . COPD Father        smoker  . Hypertension Father   . Depression Father   . Alcoholism Father   .  CAD Mother 47       Died age 61 of CAD  . Heart disease Mother   . Graves' disease Mother   . Hypertension Mother   . Thyroid disease Mother   . CAD Maternal Grandfather 60  . Heart disease Maternal Grandfather   . Other Sister 6       hysterectomy for fibroids  . Prostate cancer Brother        low grade; w/ surveillance  . Thyroid nodules Brother 59  . Fibroids Other        niece dx approx 36  . Hypertension Other   . Kidney cancer Cousin        maternal 1st cousin dx 39-47; former smoker  . Lung cancer Other        maternal great aunt (MGM's sister); not a smoker  .  Cancer Other        nephew dx neuroblastoma at 51.2 years old  . Colon cancer Neg Hx   . Colon polyps Neg Hx   The patient's father died at the age of 61 from myocardial infarction in the setting of dementia. The patient's mother died at the age of 66 from heart disease. Stacey Roberts has 2 brothers, 2 sisters. There is no history of breast ovarian or colon cancer in the family. Of course she has a history of thyroid cancer  GYNECOLOGIC HISTORY:  No LMP recorded. (Menstrual status: IUD). Menarche age 25, she is GX P0. She has a Mirena in place.  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 She is divorced. The patient is not a church attender.    ADVANCED DIRECTIVES: the patient has named her sister Aram Beecham as her healthcare power of attorney  HEALTH MAINTENANCE: Social History   Tobacco Use  . Smoking status: Former Smoker    Packs/day: 0.50    Types: Cigarettes    Quit date: 09/29/2007    Years since quitting: 10.9  . Smokeless tobacco: Never Used  . Tobacco comment: Smoked on and off for 20 years. Quit about 8 years ago (as of 08/2015)  Substance Use Topics  . Alcohol use: Yes    Alcohol/week: 0.0 standard drinks    Comment: occasionally  . Drug use: No     Colonoscopy:  PAP:  Bone density:   Allergies  Allergen Reactions  . Penicillins Shortness Of Breath, Rash and Other (See Comments)    Has patient had a PCN reaction causing immediate rash, facial/tongue/throat swelling, SOB or lightheadedness with hypotension: Yes Has patient had a PCN reaction causing severe rash involving mucus membranes or skin necrosis: Yes Has patient had a PCN reaction that required hospitalization No Has patient had a PCN reaction occurring within the last 10 years: Yes If all of the above answers are "NO", then may proceed with Cephalosporin use.   . Clindamycin/Lincomycin Other (See Comments)    Severe stomach cramps  . Ace Inhibitors Other (See Comments) and  Cough    REACTION: cough; denies airway involvement     Current Outpatient Medications  Medication Sig Dispense Refill  . ACCU-CHEK GUIDE test strip USED TO CHECK BLOOD SUGARS ONCE DAILY. 100 each 2  . Cholecalciferol (EQL VITAMIN D3) 2000 units CAPS Take 2,000 Units daily by mouth.    . dapagliflozin propanediol (FARXIGA) 10 MG TABS tablet Take 10 mg by mouth daily. 90 tablet 3  . fluticasone (FLONASE) 50 MCG/ACT nasal spray Place 2 sprays into both nostrils daily. 16  g 0  . gabapentin (NEURONTIN) 300 MG capsule Take 2 capsules (600 mg total) by mouth at bedtime. 180 capsule 4  . Insulin Glargine (LANTUS SOLOSTAR) 100 UNIT/ML Solostar Pen Inject 75 Units into the skin every morning. And pen needles 1/day 30 pen 11  . levothyroxine (SYNTHROID, LEVOTHROID) 150 MCG tablet Take 1 tablet (150 mcg total) by mouth daily. 90 tablet 3  . losartan (COZAAR) 50 MG tablet TAKE 1 TABLET BY MOUTH ONCE DAILY 90 tablet 2  . metFORMIN (GLUCOPHAGE) 1000 MG tablet TAKE 1 TABLET (1,000 MG TOTAL) BY MOUTH 2 (TWO) TIMES DAILY WITH A MEAL. 180 tablet 3  . mupirocin ointment (BACTROBAN) 2 % Apply to wound after soaking BID 30 g 1  . pantoprazole (PROTONIX) 40 MG tablet Take 1 tablet (40 mg total) by mouth daily. MUST SCHEDULE PHYSICAL EXAM 90 tablet 0  . pregabalin (LYRICA) 75 MG capsule Take 1 capsule (75 mg total) by mouth 2 (two) times daily. 60 capsule 3  . rosuvastatin (CRESTOR) 10 MG tablet TAKE 1 TABLET (10 MG TOTAL) BY MOUTH DAILY. 90 tablet 3  . sodium chloride (OCEAN) 0.65 % SOLN nasal spray Place 1 spray into both nostrils as needed for congestion. 60 mL 0  . tamoxifen (NOLVADEX) 20 MG tablet TAKE 1 TABLET BY MOUTH DAILY 90 tablet 0  . TRULICITY 1.5 MG/0.5ML SOPN INJECT 1.5 MG INTO THE SKIN ONCE A WEEK. 2 mL 7  . venlafaxine XR (EFFEXOR-XR) 37.5 MG 24 hr capsule Take 1 capsule (37.5 mg total) by mouth daily with breakfast. 90 capsule 4   No current facility-administered medications for this visit.      OBJECTIVE: Morbidly obese white woman who appears stated age  Vitals:   08/28/18 0812  BP: (!) 158/65  Pulse: 98  Resp: 18  Temp: 98.2 F (36.8 C)  SpO2: 97%     Body mass index is 41.93 kg/m.    ECOG FS:1 - Symptomatic but completely ambulatory  Sclerae unicteric, EOMs intact Wearing a mask No cervical or supraclavicular adenopathy Lungs no rales or rhonchi Heart regular rate and rhythm Abd soft, nontender, positive bowel sounds MSK no focal spinal tenderness, no upper extremity lymphedema Neuro: nonfocal, well oriented, appropriate affect Breasts: The right breast is status post reduction mammoplasty but is otherwise unremarkable.  The left breast is status post reduction mammoplasty, lumpectomy, and radiation.  There is moderate distortion of the breast contour but no evidence of disease recurrence.  Both axillae are benign.  LAB RESULTS:  CMP     Component Value Date/Time   NA 140 08/28/2018 0800   NA 138 03/21/2017 1018   NA 137 02/18/2013 1554   K 4.5 08/28/2018 0800   K 4.4 02/18/2013 1554   CL 103 08/28/2018 0800   CO2 25 08/28/2018 0800   CO2 25 02/18/2013 1554   GLUCOSE 157 (H) 08/28/2018 0800   GLUCOSE 300 (H) 02/18/2013 1554   BUN 13 08/28/2018 0800   BUN 16 03/21/2017 1018   BUN 14.2 02/18/2013 1554   CREATININE 0.79 08/28/2018 0800   CREATININE 0.67 07/16/2017 1442   CREATININE 0.8 02/18/2013 1554   CALCIUM 8.7 (L) 08/28/2018 0800   CALCIUM 9.2 02/18/2013 1554   PROT 6.7 08/28/2018 0800   PROT 6.5 03/21/2017 1018   PROT 7.2 02/18/2013 1554   ALBUMIN 3.4 (L) 08/28/2018 0800   ALBUMIN 3.7 03/21/2017 1018   ALBUMIN 3.6 02/18/2013 1554   AST 17 08/28/2018 0800   AST 12 07/16/2017 1442  AST 10 02/18/2013 1554   ALT 22 08/28/2018 0800   ALT 12 07/16/2017 1442   ALT 16 02/18/2013 1554   ALKPHOS 76 08/28/2018 0800   ALKPHOS 85 02/18/2013 1554   BILITOT 0.5 08/28/2018 0800   BILITOT 0.3 07/16/2017 1442   BILITOT 0.33 02/18/2013 1554   GFRNONAA >60  08/28/2018 0800   GFRNONAA >60 07/16/2017 1442   GFRAA >60 08/28/2018 0800   GFRAA >60 07/16/2017 1442    INo results found for: SPEP, UPEP  Lab Results  Component Value Date   WBC 9.0 08/28/2018   NEUTROABS 6.9 08/28/2018   HGB 14.1 08/28/2018   HCT 45.2 08/28/2018   MCV 86.9 08/28/2018   PLT 245 08/28/2018      Chemistry      Component Value Date/Time   NA 140 08/28/2018 0800   NA 138 03/21/2017 1018   NA 137 02/18/2013 1554   K 4.5 08/28/2018 0800   K 4.4 02/18/2013 1554   CL 103 08/28/2018 0800   CO2 25 08/28/2018 0800   CO2 25 02/18/2013 1554   BUN 13 08/28/2018 0800   BUN 16 03/21/2017 1018   BUN 14.2 02/18/2013 1554   CREATININE 0.79 08/28/2018 0800   CREATININE 0.67 07/16/2017 1442   CREATININE 0.8 02/18/2013 1554      Component Value Date/Time   CALCIUM 8.7 (L) 08/28/2018 0800   CALCIUM 9.2 02/18/2013 1554   ALKPHOS 76 08/28/2018 0800   ALKPHOS 85 02/18/2013 1554   AST 17 08/28/2018 0800   AST 12 07/16/2017 1442   AST 10 02/18/2013 1554   ALT 22 08/28/2018 0800   ALT 12 07/16/2017 1442   ALT 16 02/18/2013 1554   BILITOT 0.5 08/28/2018 0800   BILITOT 0.3 07/16/2017 1442   BILITOT 0.33 02/18/2013 1554       No results found for: LABCA2  No components found for: LABCA125  No results for input(s): INR in the last 168 hours.  Urinalysis    Component Value Date/Time   COLORURINE YELLOW 03/15/2009 0835   APPEARANCEUR CLOUDY (A) 03/15/2009 0835   LABSPEC 1.024 03/15/2009 0835   PHURINE 7.5 03/15/2009 0835   GLUCOSEU NEGATIVE 03/15/2009 0835   GLUCOSEU NEGATIVE 01/09/2008 1524   HGBUR NEGATIVE 03/15/2009 0835   BILIRUBINUR NEGATIVE 03/15/2009 0835   KETONESUR NEGATIVE 03/15/2009 0835   PROTEINUR NEGATIVE 03/15/2009 0835   UROBILINOGEN 1.0 03/15/2009 0835   NITRITE NEGATIVE 03/15/2009 0835   LEUKOCYTESUR TRACE (A) 03/15/2009 0835     STUDIES: Mm Diag Breast Tomo Bilateral  Result Date: 08/15/2018 CLINICAL DATA:  53 year old female  status post left lumpectomy in 2014 with additional lumpectomy and radiation therapy in 2017. Patient had bilateral breast reduction in 2018. EXAM: DIGITAL DIAGNOSTIC BILATERAL MAMMOGRAM WITH CAD AND TOMO COMPARISON:  Previous exam(s). ACR Breast Density Category b: There are scattered areas of fibroglandular density. FINDINGS: Stable postoperative changes are noted in the upper-outer left breast. Diffuse skin and trabecular thickening is consistent with radiation related changes. No new or suspicious findings are identified in either breast. Mammographic images were processed with CAD. IMPRESSION: 1. No mammographic evidence of malignancy in either breast. 2. Stable left breast posttreatment changes. RECOMMENDATION: Diagnostic mammogram is suggested in 1 year. (Code:DM-B-01Y) I have discussed the findings and recommendations with the patient. Results were also provided in writing at the conclusion of the visit. If applicable, a reminder letter will be sent to the patient regarding the next appointment. BI-RADS CATEGORY  2: Benign. Electronically Signed   By:  Sande Brothers M.D.   On: 08/15/2018 08:12     ELIGIBLE FOR AVAILABLE RESEARCH PROTOCOL: No   ASSESSMENT: 53 y.o. BRCA negative Stacey Roberts, Stacey Roberts woman   (1) status post central left breast lumpectomy 01/06/2013 for a 1.5 cm ductal carcinoma in situ, grade 1, estrogen receptor 99% positive, progesterone receptor 100% positive, with a focally positive margin  (2) status post additional surgery for margin clearance 01/22/2013 showed the posterior margin negative at 0.1 cm  (3) the patient decided against adjuvant radiation; she was supposed to have started tamoxifen May 2015,  (4) left breast upper outer quadrant biopsy 09/02/2015 finds ductal carcinoma in situ, grade 1, with not enough tissue for prognostic panel  (a) upper outer quadrant biopsy retroareolar biopsy 09/16/2015, found to fibroadipose tissue and fat necrosis but no evidence of malignancy   (5) genetics testing 09/21/2015 through the [facility] Common Hereditary Cancers Panel (Breast, Gyn, GI) performed by [facility] [place]) found no deleterious mutations in APC, ATM, AXIN2, BARD1, BMPR1A, BRCA1, BRCA2, BRIP1, CDH1, CDKN2A, CHEK2, DICER1, EPCAM, GREM1, KIT, MEN1, MLH1, MSH2, MSH6, MUTYH, NBN, NF1, PALB2, PDGFRA, PMS2, POLD1, POLE, PTEN, RAD50, RAD51C, RAD51D, SDHA, SDHB, SDHC, SDHD, SMAD4, SMARCA4, STK11, TP53, TSC1, TSC2, and VHL  (6) repeat left lumpectomy and sentinel node biopsyy 10/12/2015 documented a pT2  PN0, stage IIA  Invasive ductal carcinoma, grade 1, with a focally positive margin; estrogen and progesterone receptor positive, HER-2 nonamplified, with an MIB-1 of 3%.  (a)  Left breast reexcision with bilateral oncoplastic reconstruction 10/12/2015 achieved clear margins, with no evidence of malignancy  (7) Oncotype DX score of 3 predicts a risk of recurrence outside the breast in 10 years of 4% if the patient's only systemic therapy is tamoxifen for 5 years. It also predicts no benefit from chemotherapy.  (8) adjuvant radiation11/08/17-12/19/17 Site/dose:  1) Left breast DIBH / 50.4 Gy in 28 fractions                         2) Left breast boost / 12 Gy in 6 fractions  (8) tamoxifen started 03/06/2016  (a) Mirena IUD in place  (9) status post total thyroidectomy 03/18/2018 for a follicular variant of papillary thyroid carcinoma, measuring 1.7 cm, confined within thyroid parenchyma but with angiolymphatic invasion and capsular invasion identified, pT1b pNX  (a) note normal PTEN above   PLAN: Stacey Roberts is now just about 3 years out from definitive surgery for her breast cancer with no evidence of disease recurrence.  This is very favorable.  She is tolerating tamoxifen generally well except for problems with hot flashes.  We have increased the gabapentin at bedtime to 600mg .  She does not feel woozy in the morning.  This is helping some.  Today we  discussed venlafaxine.  I think this would be helpful to her.  She has a good understanding of the possible toxicities side effects and complications of this agent.  She will start at 37.5 mg and let me know whether or not she gets any benefit from it  Otherwise she will have mammography again a year from now and see me shortly thereafter  I encouraged her to obtain a COVID vaccine when one becomes available in the next few months.  She knows to call for any other issue that may develop before the next visit.   Bhavana Kady, [name], MD  08/28/18 8:52 AM Medical Oncology and Hematology [facility] [address] [place], [place]  [address] Tel. [phone]    Fax. [phone]  I, Mal Misty am acting as a Neurosurgeon for Lowella Dell, MD.   I, Ruthann Cancer MD, have reviewed the above documentation for accuracy and completeness, and I agree with the above.

## 2018-08-28 ENCOUNTER — Inpatient Hospital Stay: Payer: No Typology Code available for payment source

## 2018-08-28 ENCOUNTER — Other Ambulatory Visit: Payer: Self-pay

## 2018-08-28 ENCOUNTER — Inpatient Hospital Stay: Payer: No Typology Code available for payment source | Attending: Oncology | Admitting: Oncology

## 2018-08-28 VITALS — BP 158/65 | HR 98 | Temp 98.2°F | Resp 18 | Ht 64.0 in | Wt 244.3 lb

## 2018-08-28 DIAGNOSIS — Z170 Estrogen receptor positive status [ER+]: Secondary | ICD-10-CM | POA: Diagnosis not present

## 2018-08-28 DIAGNOSIS — Z6836 Body mass index (BMI) 36.0-36.9, adult: Secondary | ICD-10-CM | POA: Insufficient documentation

## 2018-08-28 DIAGNOSIS — I10 Essential (primary) hypertension: Secondary | ICD-10-CM | POA: Insufficient documentation

## 2018-08-28 DIAGNOSIS — C73 Malignant neoplasm of thyroid gland: Secondary | ICD-10-CM

## 2018-08-28 DIAGNOSIS — Z8042 Family history of malignant neoplasm of prostate: Secondary | ICD-10-CM | POA: Insufficient documentation

## 2018-08-28 DIAGNOSIS — C50112 Malignant neoplasm of central portion of left female breast: Secondary | ICD-10-CM

## 2018-08-28 DIAGNOSIS — D0510 Intraductal carcinoma in situ of unspecified breast: Secondary | ICD-10-CM

## 2018-08-28 DIAGNOSIS — Z794 Long term (current) use of insulin: Secondary | ICD-10-CM | POA: Insufficient documentation

## 2018-08-28 DIAGNOSIS — Z6841 Body Mass Index (BMI) 40.0 and over, adult: Secondary | ICD-10-CM

## 2018-08-28 DIAGNOSIS — E119 Type 2 diabetes mellitus without complications: Secondary | ICD-10-CM | POA: Diagnosis not present

## 2018-08-28 DIAGNOSIS — Z7984 Long term (current) use of oral hypoglycemic drugs: Secondary | ICD-10-CM | POA: Diagnosis not present

## 2018-08-28 DIAGNOSIS — Z87891 Personal history of nicotine dependence: Secondary | ICD-10-CM | POA: Insufficient documentation

## 2018-08-28 DIAGNOSIS — Z923 Personal history of irradiation: Secondary | ICD-10-CM | POA: Diagnosis not present

## 2018-08-28 DIAGNOSIS — Z6829 Body mass index (BMI) 29.0-29.9, adult: Secondary | ICD-10-CM | POA: Insufficient documentation

## 2018-08-28 DIAGNOSIS — Z85850 Personal history of malignant neoplasm of thyroid: Secondary | ICD-10-CM | POA: Insufficient documentation

## 2018-08-28 DIAGNOSIS — Z79810 Long term (current) use of selective estrogen receptor modulators (SERMs): Secondary | ICD-10-CM | POA: Diagnosis not present

## 2018-08-28 DIAGNOSIS — Z79899 Other long term (current) drug therapy: Secondary | ICD-10-CM | POA: Insufficient documentation

## 2018-08-28 DIAGNOSIS — Z8051 Family history of malignant neoplasm of kidney: Secondary | ICD-10-CM | POA: Insufficient documentation

## 2018-08-28 DIAGNOSIS — Z801 Family history of malignant neoplasm of trachea, bronchus and lung: Secondary | ICD-10-CM | POA: Insufficient documentation

## 2018-08-28 DIAGNOSIS — D0512 Intraductal carcinoma in situ of left breast: Secondary | ICD-10-CM | POA: Diagnosis present

## 2018-08-28 DIAGNOSIS — E6609 Other obesity due to excess calories: Secondary | ICD-10-CM | POA: Insufficient documentation

## 2018-08-28 DIAGNOSIS — Z975 Presence of (intrauterine) contraceptive device: Secondary | ICD-10-CM | POA: Insufficient documentation

## 2018-08-28 LAB — CBC WITH DIFFERENTIAL/PLATELET
Abs Immature Granulocytes: 0.04 10*3/uL (ref 0.00–0.07)
Basophils Absolute: 0 10*3/uL (ref 0.0–0.1)
Basophils Relative: 0 %
Eosinophils Absolute: 0.1 10*3/uL (ref 0.0–0.5)
Eosinophils Relative: 1 %
HCT: 45.2 % (ref 36.0–46.0)
Hemoglobin: 14.1 g/dL (ref 12.0–15.0)
Immature Granulocytes: 0 %
Lymphocytes Relative: 16 %
Lymphs Abs: 1.5 10*3/uL (ref 0.7–4.0)
MCH: 27.1 pg (ref 26.0–34.0)
MCHC: 31.2 g/dL (ref 30.0–36.0)
MCV: 86.9 fL (ref 80.0–100.0)
Monocytes Absolute: 0.4 10*3/uL (ref 0.1–1.0)
Monocytes Relative: 4 %
Neutro Abs: 6.9 10*3/uL (ref 1.7–7.7)
Neutrophils Relative %: 79 %
Platelets: 245 10*3/uL (ref 150–400)
RBC: 5.2 MIL/uL — ABNORMAL HIGH (ref 3.87–5.11)
RDW: 15.1 % (ref 11.5–15.5)
WBC: 9 10*3/uL (ref 4.0–10.5)
nRBC: 0 % (ref 0.0–0.2)

## 2018-08-28 LAB — COMPREHENSIVE METABOLIC PANEL
ALT: 22 U/L (ref 0–44)
AST: 17 U/L (ref 15–41)
Albumin: 3.4 g/dL — ABNORMAL LOW (ref 3.5–5.0)
Alkaline Phosphatase: 76 U/L (ref 38–126)
Anion gap: 12 (ref 5–15)
BUN: 13 mg/dL (ref 6–20)
CO2: 25 mmol/L (ref 22–32)
Calcium: 8.7 mg/dL — ABNORMAL LOW (ref 8.9–10.3)
Chloride: 103 mmol/L (ref 98–111)
Creatinine, Ser: 0.79 mg/dL (ref 0.44–1.00)
GFR calc Af Amer: >60 mL/min (ref >60)
GFR calc non Af Amer: >60 mL/min (ref >60)
Glucose, Bld: 157 mg/dL — ABNORMAL HIGH (ref 70–99)
Potassium: 4.5 mmol/L (ref 3.5–5.1)
Sodium: 140 mmol/L (ref 135–145)
Total Bilirubin: 0.5 mg/dL (ref 0.3–1.2)
Total Protein: 6.7 g/dL (ref 6.5–8.1)

## 2018-08-28 MED ORDER — GABAPENTIN 300 MG PO CAPS: 600.0000 mg | ORAL_CAPSULE | Freq: Every day | ORAL | 4 refills | Status: DC

## 2018-08-28 MED ORDER — VENLAFAXINE HCL ER 37.5 MG PO CP24: 37.5000 mg | ORAL_CAPSULE | Freq: Every day | ORAL | 4 refills | Status: DC

## 2018-08-28 MED FILL — GABAPENTIN 300 MG CAPSULE: 300 | 90 days supply | Qty: 180 | Fill #0

## 2018-08-28 MED FILL — VENLAFAXINE HCL ER 37.5 MG: 37.5 | 90 days supply | Qty: 90 | Fill #0

## 2018-08-29 ENCOUNTER — Telehealth: Payer: Self-pay | Admitting: Oncology

## 2018-08-29 NOTE — Telephone Encounter (Signed)
I talk with patient regarding schedule  

## 2018-08-30 LAB — HM DIABETES EYE EXAM

## 2018-09-09 ENCOUNTER — Other Ambulatory Visit: Payer: Self-pay | Admitting: Oncology

## 2018-09-09 MED FILL — TAMOXIFEN CIT 20 MG TABLET: 20 | 90 days supply | Qty: 90 | Fill #0

## 2018-09-24 MED FILL — LANTUS SOLOSTAR 100 UNITS/M: 100 | 40 days supply | Qty: 30 | Fill #2

## 2018-10-17 ENCOUNTER — Ambulatory Visit (INDEPENDENT_AMBULATORY_CARE_PROVIDER_SITE_OTHER)
Admission: RE | Admit: 2018-10-17 | Discharge: 2018-10-17 | Disposition: A | Discharge status: Home / Self Care | Payer: No Typology Code available for payment source | Source: Ambulatory Visit

## 2018-10-17 ENCOUNTER — Encounter: Payer: Self-pay | Admitting: Internal Medicine

## 2018-10-17 DIAGNOSIS — Z20822 Contact with and (suspected) exposure to covid-19: Secondary | ICD-10-CM

## 2018-10-17 DIAGNOSIS — R05 Cough: Secondary | ICD-10-CM | POA: Diagnosis not present

## 2018-10-17 DIAGNOSIS — J069 Acute upper respiratory infection, unspecified: Secondary | ICD-10-CM | POA: Diagnosis not present

## 2018-10-17 DIAGNOSIS — J029 Acute pharyngitis, unspecified: Secondary | ICD-10-CM | POA: Diagnosis not present

## 2018-10-17 DIAGNOSIS — Z20828 Contact with and (suspected) exposure to other viral communicable diseases: Secondary | ICD-10-CM

## 2018-10-17 DIAGNOSIS — B9789 Other viral agents as the cause of diseases classified elsewhere: Secondary | ICD-10-CM

## 2018-10-17 MED ORDER — BENZONATATE 100 MG PO CAPS: 100.0000 mg | ORAL_CAPSULE | Freq: Three times a day (TID) | ORAL | 0 refills | Status: DC

## 2018-10-17 NOTE — ED Provider Notes (Signed)
[place] Virtual Visit via Video Note:  Stacey Roberts  initiated request for Telemedicine visit with [facility] team. I connected with Stacey Roberts  on 10/17/2018 at 3:05 PM  for a synchronized telemedicine visit using a video enabled HIPPA compliant telemedicine application. I verified that I am speaking with Stacey Roberts  using two identifiers. Stacey Box, PA-C  was physically located in a [facility] Urgent care site and Stacey Roberts was located at a different location.   The limitations of evaluation and management by telemedicine as well as the availability of in-person appointments were discussed. Patient was informed that she  may incur a bill ( including co-pay) for this virtual visit encounter. Stacey Roberts  expressed understanding and gave verbal consent to proceed with virtual visit.   [id number] 10/17/18 Arrival Time: [id number]  CC: URI symptoms   SUBJECTIVE: History from: patient.  Stacey Roberts is a 53 y.o. female who presents runny nose, sneezing, clear productive and dry cough, sore throat, and congestion x couple of days.  Denies sick exposure to COVID, flu or strep.  Denies recent travel.  Has tried cough syrup without relief.  Denies aggravating factors.  Reports previous symptoms in the past with bronchitis.  Complains of mild wheezing and chest tightness. Denies fever, chills, fatigue, sinus pain, SOB, chest pain, nausea, vomiting, changes in bowel or bladder habits.    ROS: As per HPI.  All other pertinent ROS negative.     Past Medical History:  Diagnosis Date  . Allergy    seasonal  . Breast cancer ([place]) 2014  . Breast cancer ([place]) 2017  . Bronchitis    hx of  . Diabetes mellitus, type II ([place])   . GERD (gastroesophageal reflux disease)   . Headache   . History of radiation therapy 12/01/15-01/11/16   Left breast DIBH / 50.4 Gy in 28 fractions and Left breast boost / 12 Gy in 6 fractions  . Hyperlipidemia   .  Hypertension   . Hypothyroidism   . Joint pain   . Obesity   . PCOS (polycystic ovarian syndrome)   . Personal history of radiation therapy 2017  . Pneumonia    as a child  . Sleep apnea 2008   severe OSA-does not use a cpap  . Snoring   . 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 Buddy Duty   Past Surgical History:  Procedure Laterality Date  . BREAST BIOPSY  2000   s/p  . BREAST BIOPSY Left 01/22/2013   Procedure: RE-EXCISION LEFT BREAST DCIS;  Surgeon: Harl Bowie, MD;  Location: [place];  Service: General;  Laterality: Left;  . BREAST LUMPECTOMY Left 01/06/2013   Procedure: LUMPECTOMY;  Surgeon: Harl Bowie, MD;  Location: [place];  Service: General;  Laterality: Left;  . BREAST LUMPECTOMY Left 2017  . BREAST LUMPECTOMY WITH NEEDLE LOCALIZATION AND AXILLARY SENTINEL LYMPH NODE BX Left 09/30/2015   Procedure: Left BREAST LUMPECTOMY WITH NEEDLE LOCALIZATION AND AXILLARY SENTINEL LYMPH NODE BX;  Surgeon: Coralie Keens, MD;  Location: [place];  Service: General;  Laterality: Left;  . BREAST RECONSTRUCTION Bilateral 10/12/2015   Procedure: BREAST oncoplastic RECONSTRUCTION;  Surgeon: Irene Limbo, MD;  Location: [place];  Service: Plastics;  Laterality: Bilateral;  . BREAST REDUCTION SURGERY Bilateral 10/12/2015   Procedure: MAMMARY REDUCTION  (BREAST);  Surgeon: Arnoldo Hooker  Iran Planas, MD;  Location: [place];  Service: Plastics;  Laterality: Bilateral;  . DILATION AND CURETTAGE OF UTERUS  2004   s/p  . EXCISION OF BREAST LESION Left 10/12/2015   Procedure: Re-EXCISION OF BREAST;  Surgeon: Coralie Keens, MD;  Location: [place];  Service: General;  Laterality: Left;  . REDUCTION MAMMAPLASTY Bilateral 09/2015  . THYROIDECTOMY  02/2009   Dr Harlow Asa  . TONSILLECTOMY  1982   Allergies  Allergen Reactions  .  Penicillins Shortness Of Breath, Rash and Other (See Comments)    Has patient had a PCN reaction causing immediate rash, facial/tongue/throat swelling, SOB or lightheadedness with hypotension: Yes Has patient had a PCN reaction causing severe rash involving mucus membranes or skin necrosis: Yes Has patient had a PCN reaction that required hospitalization No Has patient had a PCN reaction occurring within the last 10 years: Yes If all of the above answers are "NO", then may proceed with Cephalosporin use.   . Clindamycin/Lincomycin Other (See Comments)    Severe stomach cramps  . Ace Inhibitors Other (See Comments) and Cough    REACTION: cough; denies airway involvement    No current facility-administered medications on file prior to encounter.    Current Outpatient Medications on File Prior to Encounter  Medication Sig Dispense Refill  . ACCU-CHEK GUIDE test strip USED TO CHECK BLOOD SUGARS ONCE DAILY. 100 each 2  . Cholecalciferol (EQL VITAMIN D3) 2000 units CAPS Take 2,000 Units daily by mouth.    . dapagliflozin propanediol (FARXIGA) 10 MG TABS tablet Take 10 mg by mouth daily. 90 tablet 3  . fluticasone (FLONASE) 50 MCG/ACT nasal spray Place 2 sprays into both nostrils daily. 16 g 0  . gabapentin (NEURONTIN) 300 MG capsule Take 2 capsules (600 mg total) by mouth at bedtime. 180 capsule 4  . Insulin Glargine (LANTUS SOLOSTAR) 100 UNIT/ML Solostar Pen Inject 75 Units into the skin every morning. And pen needles 1/day 30 pen 11  . levothyroxine (SYNTHROID, LEVOTHROID) 150 MCG tablet Take 1 tablet (150 mcg total) by mouth daily. 90 tablet 3  . losartan (COZAAR) 50 MG tablet TAKE 1 TABLET BY MOUTH ONCE DAILY 90 tablet 2  . metFORMIN (GLUCOPHAGE) 1000 MG tablet TAKE 1 TABLET (1,000 MG TOTAL) BY MOUTH 2 (TWO) TIMES DAILY WITH A MEAL. 180 tablet 3  . mupirocin ointment (BACTROBAN) 2 % Apply to wound after soaking BID 30 g 1  . pantoprazole (PROTONIX) 40 MG tablet Take 1 tablet (40 mg total) by  mouth daily. MUST SCHEDULE PHYSICAL EXAM 90 tablet 0  . pregabalin (LYRICA) 75 MG capsule Take 1 capsule (75 mg total) by mouth 2 (two) times daily. 60 capsule 3  . rosuvastatin (CRESTOR) 10 MG tablet TAKE 1 TABLET (10 MG TOTAL) BY MOUTH DAILY. 90 tablet 3  . sodium chloride (OCEAN) 0.65 % SOLN nasal spray Place 1 spray into both nostrils as needed for congestion. 60 mL 0  . tamoxifen (NOLVADEX) 20 MG tablet TAKE 1 TABLET BY MOUTH DAILY 90 tablet 0  . TRULICITY 1.5 0000000 SOPN INJECT 1.5 MG INTO THE SKIN ONCE A WEEK. 2 mL 7  . venlafaxine XR (EFFEXOR-XR) 37.5 MG 24 hr capsule Take 1 capsule (37.5 mg total) by mouth daily with breakfast. 90 capsule 4  . [DISCONTINUED] Calcium Carbonate 1500 MG TABS Take 1,500 mg by mouth daily.        OBJECTIVE:   There were no vitals filed for this visit.  General appearance: alert;  appears mildly fatigued, nontoxic Eyes: EOMI grossly HENT: normocephalic; atraumatic Neck: supple with FROM Lungs: normal respiratory effort; speaking in full sentences without difficulty; mild cough Extremities: moves extremities without difficulty Skin: No obvious rashes Neurologic: No facial asymmetries Psychological: alert and cooperative; normal mood and affect  ASSESSMENT & PLAN:  1. Suspected Covid-19 Virus Infection   2. Viral URI with cough     Meds ordered this encounter  Medications  . benzonatate (TESSALON) 100 MG capsule    Sig: Take 1 capsule (100 mg total) by mouth every 8 (eight) hours.    Dispense:  21 capsule    Refill:  0    Order Specific Question:   Supervising Provider    Answer:   Raylene Everts [id number]    COVID testing ordered.  Go to [place] at the [facility].    In the meantime: You should remain isolated in your home for 10 days from symptom onset AND greater than 72 hours after symptoms resolution (absence of fever without the use of fever-reducing medication and improvement in  respiratory symptoms), whichever is longer Get plenty of rest and push fluids Tessalon Perles prescribed for cough Use OTC zyrtec and/or flonase as needed for runny nose and congestion Use medications daily for symptom relief Use OTC medications like ibuprofen or tylenol as needed fever or pain Call or go to the ED if you have any new or worsening symptoms such as fever, worsening cough, shortness of breath, chest tightness, chest pain, turning blue, changes in mental status, etc...  I discussed the assessment and treatment plan with the patient. The patient was provided an opportunity to ask questions and all were answered. The patient agreed with the plan and demonstrated an understanding of the instructions.   The patient was advised to call back or seek an in-person evaluation if the symptoms worsen or if the condition fails to improve as anticipated.  I provided 12 minutes of non-face-to-face time during this encounter.  [place], PA-C  10/17/2018 3:05 PM          Stacey Box, PA-C 10/17/18 1507

## 2018-10-17 NOTE — Discharge Instructions (Addendum)
COVID testing ordered.  Go to [place] at the [facility].    In the meantime: You should remain isolated in your home for 10 days from symptom onset AND greater than 72 hours after symptoms resolution (absence of fever without the use of fever-reducing medication and improvement in respiratory symptoms), whichever is longer Get plenty of rest and push fluids Tessalon Perles prescribed for cough Use OTC zyrtec and/or flonase as needed for runny nose and congestion Use medications daily for symptom relief Use OTC medications like ibuprofen or tylenol as needed fever or pain Call or go to the ED if you have any new or worsening symptoms such as fever, worsening cough, shortness of breath, chest tightness, chest pain, turning blue, changes in mental status, etc..Marland Kitchen

## 2018-10-23 ENCOUNTER — Ambulatory Visit (INDEPENDENT_AMBULATORY_CARE_PROVIDER_SITE_OTHER): Payer: No Typology Code available for payment source | Admitting: Internal Medicine

## 2018-10-23 DIAGNOSIS — J029 Acute pharyngitis, unspecified: Secondary | ICD-10-CM

## 2018-10-23 DIAGNOSIS — R0982 Postnasal drip: Secondary | ICD-10-CM

## 2018-10-23 DIAGNOSIS — R05 Cough: Secondary | ICD-10-CM | POA: Diagnosis not present

## 2018-10-23 DIAGNOSIS — R059 Cough, unspecified: Secondary | ICD-10-CM

## 2018-10-23 MED ORDER — HYDROCODONE-HOMATROPINE 5-1.5 MG/5ML PO SYRP: 5.0000 mL | ORAL_SOLUTION | Freq: Three times a day (TID) | ORAL | 0 refills | Status: DC | PRN

## 2018-10-23 NOTE — Progress Notes (Signed)
Virtual Visit via Video Note  I connected with Stacey Roberts on 10/23/18 at  3:45 PM EDT by a video enabled telemedicine application and verified that I am speaking with the correct person using two identifiers.  Location: Patient: Home Provider: Office   I discussed the limitations of evaluation and management by telemedicine and the availability of in person appointments. The patient expressed understanding and agreed to proceed.  History of Present Illness:  Pt due for UC follow up. Went to the [facility] 9/24 with c/o runny nose, sneezing, nasal congestion, sore throat and cough. This has started a few days prior to her UC visit. She tested negative for COVID 19. She was given a RX for Tessalon Pearls for cough, and advised to rest, drink fluids and quarantine until she is filling better. Since that time, she has continued to take the Tessalon Pearls along with liquid Delsym and Mucinex. She still has a productive cough (light yellow phlegm) that is disrupting her daily activities and is now experiencing some post nasal drip. She reports difficulty sleeping because her cough is keeping her awake.  Marland Kitchen Past Medical History:  Diagnosis Date  . Allergy    seasonal  . Breast cancer ([place]) 2014  . Breast cancer ([place]) 2017  . Bronchitis    hx of  . Diabetes mellitus, type II ([place])   . GERD (gastroesophageal reflux disease)   . Headache   . History of radiation therapy 12/01/15-01/11/16   Left breast DIBH / 50.4 Gy in 28 fractions and Left breast boost / 12 Gy in 6 fractions  . Hyperlipidemia   . Hypertension   . Hypothyroidism   . Joint pain   . Obesity   . PCOS (polycystic ovarian syndrome)   . Personal history of radiation therapy 2017  . Pneumonia    as a child  . Sleep apnea 2008   severe OSA-does not use a cpap  . Snoring   . 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 Buddy Duty     Current Outpatient Medications  Medication Sig Dispense Refill  . ACCU-CHEK GUIDE test strip USED TO CHECK BLOOD SUGARS ONCE DAILY. 100 each 2  . benzonatate (TESSALON) 100 MG capsule Take 1 capsule (100 mg total) by mouth every 8 (eight) hours. 21 capsule 0  . Cholecalciferol (EQL VITAMIN D3) 2000 units CAPS Take 2,000 Units daily by mouth.    . dapagliflozin propanediol (FARXIGA) 10 MG TABS tablet Take 10 mg by mouth daily. 90 tablet 3  . fluticasone (FLONASE) 50 MCG/ACT nasal spray Place 2 sprays into both nostrils daily. 16 g 0  . gabapentin (NEURONTIN) 300 MG capsule Take 2 capsules (600 mg total) by mouth at bedtime. 180 capsule 4  . Insulin Glargine (LANTUS SOLOSTAR) 100 UNIT/ML Solostar Pen Inject 75 Units into the skin every morning. And pen needles 1/day 30 pen 11  . levothyroxine (SYNTHROID, LEVOTHROID) 150 MCG tablet Take 1 tablet (150 mcg total) by mouth daily. 90 tablet 3  . losartan (COZAAR) 50 MG tablet TAKE 1 TABLET BY MOUTH ONCE DAILY 90 tablet 2  . metFORMIN (GLUCOPHAGE) 1000 MG tablet TAKE 1 TABLET (1,000 MG TOTAL) BY MOUTH 2 (TWO) TIMES DAILY WITH A MEAL. 180 tablet 3  . mupirocin ointment (BACTROBAN) 2 % Apply to wound after soaking BID 30 g 1  . pantoprazole (PROTONIX) 40 MG tablet Take 1 tablet (40 mg total) by mouth  daily. MUST SCHEDULE PHYSICAL EXAM 90 tablet 0  . pregabalin (LYRICA) 75 MG capsule Take 1 capsule (75 mg total) by mouth 2 (two) times daily. 60 capsule 3  . rosuvastatin (CRESTOR) 10 MG tablet TAKE 1 TABLET (10 MG TOTAL) BY MOUTH DAILY. 90 tablet 3  . sodium chloride (OCEAN) 0.65 % SOLN nasal spray Place 1 spray into both nostrils as needed for congestion. 60 mL 0  . tamoxifen (NOLVADEX) 20 MG tablet TAKE 1 TABLET BY MOUTH DAILY 90 tablet 0  . TRULICITY 1.5 0000000 SOPN INJECT 1.5 MG INTO THE SKIN ONCE A WEEK. 2 mL 7  . venlafaxine XR (EFFEXOR-XR) 37.5 MG 24 hr capsule Take 1 capsule (37.5 mg total) by mouth daily with breakfast. 90 capsule 4   No  current facility-administered medications for this visit.     Allergies  Allergen Reactions  . Penicillins Shortness Of Breath, Rash and Other (See Comments)    Has patient had a PCN reaction causing immediate rash, facial/tongue/throat swelling, SOB or lightheadedness with hypotension: Yes Has patient had a PCN reaction causing severe rash involving mucus membranes or skin necrosis: Yes Has patient had a PCN reaction that required hospitalization No Has patient had a PCN reaction occurring within the last 10 years: Yes If all of the above answers are "NO", then may proceed with Cephalosporin use.   . Clindamycin/Lincomycin Other (See Comments)    Severe stomach cramps  . Ace Inhibitors Other (See Comments) and Cough    REACTION: cough; denies airway involvement     Family History  Problem Relation Age of Onset  . Dementia Father        deceased age 74 secondary to dementia  . Bipolar disorder Father   . Emphysema Father   . Heart disease Father   . COPD Father        smoker  . Hypertension Father   . Depression Father   . Alcoholism Father   . CAD Mother 48       Died age 74 of CAD  . Heart disease Mother   . Graves' disease Mother   . Hypertension Mother   . Thyroid disease Mother   . CAD Maternal Grandfather 60  . Heart disease Maternal Grandfather   . Other Sister 15       hysterectomy for fibroids  . Prostate cancer Brother        low grade; w/ surveillance  . Thyroid nodules Brother 43  . Fibroids Other        niece dx approx 50  . Hypertension Other   . Kidney cancer Cousin        maternal 1st cousin dx 98-47; former smoker  . Lung cancer Other        maternal great aunt (MGM's sister); not a smoker  . Cancer Other        nephew dx neuroblastoma at 8.22 years old  . Colon cancer Neg Hx   . Colon polyps Neg Hx     Social History   Socioeconomic History  . Marital status: Single    Spouse name: Not on file  . Number of children: Not on file  . Years  of education: Not on file  . Highest education level: Not on file  Occupational History    Employer: [employer]    Comment: Outpatient scheduling in radiology  Social Needs  . Financial resource strain: Not on file  . Food insecurity    Worry: Not on file  Inability: Not on file  . Transportation needs    Medical: Not on file    Non-medical: Not on file  Tobacco Use  . Smoking status: Former Smoker    Packs/day: 0.50    Types: Cigarettes    Quit date: 09/29/2007    Years since quitting: 11.0  . Smokeless tobacco: Never Used  . Tobacco comment: Smoked on and off for 20 years. Quit about 8 years ago (as of 08/2015)  Substance and Sexual Activity  . Alcohol use: Yes    Alcohol/week: 0.0 standard drinks    Comment: occasionally  . Drug use: No  . Sexual activity: Yes  Lifestyle  . Physical activity    Days per week: Not on file    Minutes per session: Not on file  . Stress: Not on file  Relationships  . Social Herbalist on phone: Not on file    Gets together: Not on file    Attends religious service: Not on file    Active member of club or organization: Not on file    Attends meetings of clubs or organizations: Not on file    Relationship status: Not on file  . Intimate partner violence    Fear of current or ex partner: Not on file    Emotionally abused: Not on file    Physically abused: Not on file    Forced sexual activity: Not on file  Other Topics Concern  . Not on file  Social History Narrative   The patient is divorced, moved to [place] in   2006 from [place].  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    No alcohol.  Tobacco use:  She quit 5 years ago.  She smoked on   and off for approximately 20 years.  No history of recreational drug   Use.Drinks two cups of coffee per work day.     Constitutional: Denies fever, malaise, fatigue, headache or abrupt  weight changes.  HEENT: Positive sore throat and post nasal drip. Denies eye pain, eye redness, ear pain, ringing in the ears, wax buildup, runny nose,  bloody nose. Respiratory: Positive cough with sputum production. Denies difficulty breathing, or shortness of breath.  No other specific complaints in a complete review of systems (except as listed in HPI above).  Observations/Objective:   Wt Readings from Last 3 Encounters:  08/28/18 244 lb 4.8 oz (110.8 kg)  03/22/18 237 lb 12.8 oz (107.9 kg)  02/12/18 235 lb (106.6 kg)    General: Appears her stated age, obese, in NAD. Pulmonary/Chest: Normal effort. No respiratory distress. No audible wheezing noted. Neurological: Alert and oriented.   BMET    Component Value Date/Time   NA 140 08/28/2018 0800   NA 138 03/21/2017 1018   NA 137 02/18/2013 1554   K 4.5 08/28/2018 0800   K 4.4 02/18/2013 1554   CL 103 08/28/2018 0800   CO2 25 08/28/2018 0800   CO2 25 02/18/2013 1554   GLUCOSE 157 (H) 08/28/2018 0800   GLUCOSE 300 (H) 02/18/2013 1554   BUN 13 08/28/2018 0800   BUN 16 03/21/2017 1018   BUN 14.2 02/18/2013 1554   CREATININE 0.79 08/28/2018 0800   CREATININE 0.67 07/16/2017 1442   CREATININE 0.8 02/18/2013 1554   CALCIUM 8.7 (L) 08/28/2018 0800   CALCIUM 9.2 02/18/2013 1554   GFRNONAA >60 08/28/2018 0800  GFRNONAA >60 07/16/2017 1442   GFRAA >60 08/28/2018 0800   GFRAA >60 07/16/2017 1442    Lipid Panel     Component Value Date/Time   CHOL 122 03/21/2017 1018   TRIG 90 03/21/2017 1018   HDL 41 03/21/2017 1018   CHOLHDL 4 09/28/2016 1451   VLDL 32.2 09/28/2016 1451   LDLCALC 63 03/21/2017 1018    CBC    Component Value Date/Time   WBC 9.0 08/28/2018 0800   RBC 5.20 (H) 08/28/2018 0800   HGB 14.1 08/28/2018 0800   HGB 13.0 07/16/2017 1442   HGB 13.5 03/21/2017 1018   HGB 12.5 02/18/2013 1554   HCT 45.2 08/28/2018 0800   HCT 42.0 03/21/2017 1018   HCT 39.6 02/18/2013 1554   PLT 245 08/28/2018 0800    PLT 274 07/16/2017 1442   PLT 306 02/18/2013 1554   MCV 86.9 08/28/2018 0800   MCV 86 03/21/2017 1018   MCV 82.6 02/18/2013 1554   MCH 27.1 08/28/2018 0800   MCHC 31.2 08/28/2018 0800   RDW 15.1 08/28/2018 0800   RDW 14.6 03/21/2017 1018   RDW 16.4 (H) 02/18/2013 1554   LYMPHSABS 1.5 08/28/2018 0800   LYMPHSABS 1.5 03/21/2017 1018   LYMPHSABS 2.0 02/18/2013 1554   MONOABS 0.4 08/28/2018 0800   MONOABS 0.6 02/18/2013 1554   EOSABS 0.1 08/28/2018 0800   EOSABS 0.1 03/21/2017 1018   BASOSABS 0.0 08/28/2018 0800   BASOSABS 0.0 03/21/2017 1018   BASOSABS 0.0 02/18/2013 1554    Hgb A1C Lab Results  Component Value Date   HGBA1C 8.4 (A) 03/22/2018       Assessment and Plan: Post Nasal Drip, Sore Throat and Cough:  UC notes and labs reviewed Recommended Zyrtec and Flonase OTC Continue Tessalon Pearls RX for Hycodan cough syrup- sedation caution given  Return precautions discussed  Follow Up Instructions:    I discussed the assessment and treatment plan with the patient. The patient was provided an opportunity to ask questions and all were answered. The patient agreed with the plan and demonstrated an understanding of the instructions.   The patient was advised to call back or seek an in-person evaluation if the symptoms worsen or if the condition fails to improve as anticipated.     [name], NP

## 2018-10-26 ENCOUNTER — Encounter: Payer: Self-pay | Admitting: Internal Medicine

## 2018-10-26 NOTE — Patient Instructions (Signed)

## 2018-11-18 ENCOUNTER — Ambulatory Visit (INDEPENDENT_AMBULATORY_CARE_PROVIDER_SITE_OTHER): Payer: No Typology Code available for payment source | Admitting: Internal Medicine

## 2018-11-18 ENCOUNTER — Encounter: Payer: Self-pay | Admitting: Internal Medicine

## 2018-11-18 ENCOUNTER — Other Ambulatory Visit: Payer: Self-pay

## 2018-11-18 VITALS — BP 148/90 | HR 92 | Temp 98.2°F | Ht 64.0 in | Wt 242.0 lb

## 2018-11-18 DIAGNOSIS — C50112 Malignant neoplasm of central portion of left female breast: Secondary | ICD-10-CM

## 2018-11-18 DIAGNOSIS — E890 Postprocedural hypothyroidism: Secondary | ICD-10-CM | POA: Diagnosis not present

## 2018-11-18 DIAGNOSIS — Z0000 Encounter for general adult medical examination without abnormal findings: Secondary | ICD-10-CM | POA: Diagnosis not present

## 2018-11-18 DIAGNOSIS — C73 Malignant neoplasm of thyroid gland: Secondary | ICD-10-CM | POA: Diagnosis not present

## 2018-11-18 DIAGNOSIS — F329 Major depressive disorder, single episode, unspecified: Secondary | ICD-10-CM

## 2018-11-18 DIAGNOSIS — E785 Hyperlipidemia, unspecified: Secondary | ICD-10-CM

## 2018-11-18 DIAGNOSIS — F419 Anxiety disorder, unspecified: Secondary | ICD-10-CM

## 2018-11-18 DIAGNOSIS — E559 Vitamin D deficiency, unspecified: Secondary | ICD-10-CM

## 2018-11-18 DIAGNOSIS — Z170 Estrogen receptor positive status [ER+]: Secondary | ICD-10-CM

## 2018-11-18 DIAGNOSIS — I10 Essential (primary) hypertension: Secondary | ICD-10-CM | POA: Diagnosis not present

## 2018-11-18 DIAGNOSIS — K219 Gastro-esophageal reflux disease without esophagitis: Secondary | ICD-10-CM

## 2018-11-18 DIAGNOSIS — F32A Depression, unspecified: Secondary | ICD-10-CM

## 2018-11-18 DIAGNOSIS — E1142 Type 2 diabetes mellitus with diabetic polyneuropathy: Secondary | ICD-10-CM | POA: Diagnosis not present

## 2018-11-18 NOTE — Progress Notes (Signed)
Subjective:    Patient ID: Stacey Roberts, female    DOB: 1965/03/09, 53 y.o.   MRN: [id number]  HPI  Pt presents to the clinic today for her annual exam. She is also due to follow up chronic conditions.   Hx of Breast Cancer: Reoccurrence 09/2015. S/p lumpectomy, reduction and reconstruction. S/p radiation, no chemo. She takes Tamoxifen as prescribed. She follows with Dr. Griffith Citron.  DM2: Her [name] was 8.4%, 02/2018.  She is taking [place], Metformin,Trulicity and Lantus as prescribed..  She she alternates Lyrica and Gabapentin as needed for neuropathic pain.  Her sugars have been averaging around 270.  She does not routinely check her feet.  Her last eye exam was within the last year.  She follows with Dr. Loanne Drilling.  GERD: She is not sure what triggers this.  She denies breakthrough on Pantoprazole.  There is no upper GI on file.  HLD: Her last LDL was 63, 02/2017.  She denies myalgias on Rosuvastatin.  She does not consume a low-fat diet.  HTN: Her BP today is 148/90.  She is taking Losartan as prescribed.  ECG from 02/2017 reviewed.  Hypothyroidism: Postsurgical secondary to thyroid cancer.  She denies any issues on her current dose of Levothyroxine.  She follows with Dr. Loanne Drilling.  Anxiety: Stable on meds.  She is not currently seeing a therapist.  She denies depression, SI/HI.  Flu: 09/2018 Tetanus: 04/2005 Pneumovax: 04/2005 Pap smear: 09/2017 Mammogram: 07/2018. Colon screening: 03/2015 Vision screening: Annually Dentist: As needed  Diet: She does eat meat.  She consumes some fruits and vegetables.  She does eat fried foods.  She drinks mostly water and sweet tea. Exercise: None.  Review of Systems  Past Medical History:  Diagnosis Date  . Allergy    seasonal  . Breast cancer ([place]) 2014  . Breast cancer ([place]) 2017  . Bronchitis    hx of  . Diabetes mellitus, type II ([place])   . GERD (gastroesophageal reflux disease)   . Headache   . History of radiation therapy  12/01/15-01/11/16   Left breast DIBH / 50.4 Gy in 28 fractions and Left breast boost / 12 Gy in 6 fractions  . Hyperlipidemia   . Hypertension   . Hypothyroidism   . Joint pain   . Obesity   . PCOS (polycystic ovarian syndrome)   . Personal history of radiation therapy 2017  . Pneumonia    as a child  . Sleep apnea 2008   severe OSA-does not use a cpap  . Snoring   . 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 Buddy Duty    Current Outpatient Medications  Medication Sig Dispense Refill  . ACCU-CHEK GUIDE test strip USED TO CHECK BLOOD SUGARS ONCE DAILY. 100 each 2  . benzonatate (TESSALON) 100 MG capsule Take 1 capsule (100 mg total) by mouth every 8 (eight) hours. 21 capsule 0  . Cholecalciferol (EQL VITAMIN D3) 2000 units CAPS Take 2,000 Units daily by mouth.    . dapagliflozin propanediol (FARXIGA) 10 MG TABS tablet Take 10 mg by mouth daily. 90 tablet 3  . fluticasone (FLONASE) 50 MCG/ACT nasal spray Place 2 sprays into both nostrils daily. 16 g 0  . gabapentin (NEURONTIN) 300 MG capsule Take 2 capsules (600 mg total) by mouth at bedtime. 180 capsule 4  . HYDROcodone-homatropine (HYCODAN) 5-1.5 MG/5ML syrup Take 5 mLs by mouth every 8 (eight) hours  as needed for cough. 120 mL 0  . Insulin Glargine (LANTUS SOLOSTAR) 100 UNIT/ML Solostar Pen Inject 75 Units into the skin every morning. And pen needles 1/day 30 pen 11  . levothyroxine (SYNTHROID, LEVOTHROID) 150 MCG tablet Take 1 tablet (150 mcg total) by mouth daily. 90 tablet 3  . losartan (COZAAR) 50 MG tablet TAKE 1 TABLET BY MOUTH ONCE DAILY 90 tablet 2  . metFORMIN (GLUCOPHAGE) 1000 MG tablet TAKE 1 TABLET (1,000 MG TOTAL) BY MOUTH 2 (TWO) TIMES DAILY WITH A MEAL. 180 tablet 3  . mupirocin ointment (BACTROBAN) 2 % Apply to wound after soaking BID 30 g 1  . pantoprazole (PROTONIX) 40 MG tablet Take 1 tablet (40 mg total) by mouth  daily. MUST SCHEDULE PHYSICAL EXAM 90 tablet 0  . pregabalin (LYRICA) 75 MG capsule Take 1 capsule (75 mg total) by mouth 2 (two) times daily. 60 capsule 3  . rosuvastatin (CRESTOR) 10 MG tablet TAKE 1 TABLET (10 MG TOTAL) BY MOUTH DAILY. 90 tablet 3  . sodium chloride (OCEAN) 0.65 % SOLN nasal spray Place 1 spray into both nostrils as needed for congestion. 60 mL 0  . tamoxifen (NOLVADEX) 20 MG tablet TAKE 1 TABLET BY MOUTH DAILY 90 tablet 0  . TRULICITY 1.5 0000000 SOPN INJECT 1.5 MG INTO THE SKIN ONCE A WEEK. 2 mL 7  . venlafaxine XR (EFFEXOR-XR) 37.5 MG 24 hr capsule Take 1 capsule (37.5 mg total) by mouth daily with breakfast. 90 capsule 4   No current facility-administered medications for this visit.     Allergies  Allergen Reactions  . Penicillins Shortness Of Breath, Rash and Other (See Comments)    Has patient had a PCN reaction causing immediate rash, facial/tongue/throat swelling, SOB or lightheadedness with hypotension: Yes Has patient had a PCN reaction causing severe rash involving mucus membranes or skin necrosis: Yes Has patient had a PCN reaction that required hospitalization No Has patient had a PCN reaction occurring within the last 10 years: Yes If all of the above answers are "NO", then may proceed with Cephalosporin use.   . Clindamycin/Lincomycin Other (See Comments)    Severe stomach cramps  . Ace Inhibitors Other (See Comments) and Cough    REACTION: cough; denies airway involvement     Family History  Problem Relation Age of Onset  . Dementia Father        deceased age 91 secondary to dementia  . Bipolar disorder Father   . Emphysema Father   . Heart disease Father   . COPD Father        smoker  . Hypertension Father   . Depression Father   . Alcoholism Father   . CAD Mother 74       Died age 42 of CAD  . Heart disease Mother   . Graves' disease Mother   . Hypertension Mother   . Thyroid disease Mother   . CAD Maternal Grandfather 60  . Heart  disease Maternal Grandfather   . Other Sister 78       hysterectomy for fibroids  . Prostate cancer Brother        low grade; w/ surveillance  . Thyroid nodules Brother 47  . Fibroids Other        niece dx approx 30  . Hypertension Other   . Kidney cancer Cousin        maternal 1st cousin dx 33-47; former smoker  . Lung cancer Other  maternal great aunt (MGM's sister); not a smoker  . Cancer Other        nephew dx neuroblastoma at 72.66 years old  . Colon cancer Neg Hx   . Colon polyps Neg Hx     Social History   Socioeconomic History  . Marital status: Single    Spouse name: Not on file  . Number of children: Not on file  . Years of education: Not on file  . Highest education level: Not on file  Occupational History    Employer: [employer]    Comment: Outpatient scheduling in radiology  Social Needs  . Financial resource strain: Not on file  . Food insecurity    Worry: Not on file    Inability: Not on file  . Transportation needs    Medical: Not on file    Non-medical: Not on file  Tobacco Use  . Smoking status: Former Smoker    Packs/day: 0.50    Types: Cigarettes    Quit date: 09/29/2007    Years since quitting: 11.1  . Smokeless tobacco: Never Used  . Tobacco comment: Smoked on and off for 20 years. Quit about 8 years ago (as of 08/2015)  Substance and Sexual Activity  . Alcohol use: Yes    Alcohol/week: 0.0 standard drinks    Comment: occasionally  . Drug use: No  . Sexual activity: Yes  Lifestyle  . Physical activity    Days per week: Not on file    Minutes per session: Not on file  . Stress: Not on file  Relationships  . Social Herbalist on phone: Not on file    Gets together: Not on file    Attends religious service: Not on file    Active member of club or organization: Not on file    Attends meetings of clubs or organizations: Not on file    Relationship status: Not on file  . Intimate partner violence    Fear of current or ex  partner: Not on file    Emotionally abused: Not on file    Physically abused: Not on file    Forced sexual activity: Not on file  Other Topics Concern  . Not on file  Social History Narrative   The patient is divorced, moved to [place] in   2006 from [place].  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    No alcohol.  Tobacco use:  She quit 5 years ago.  She smoked on   and off for approximately 20 years.  No history of recreational drug   Use.Drinks two cups of coffee per work day.      Constitutional: Denies fever, malaise, fatigue, headache or abrupt weight changes.  HEENT: Denies eye pain, eye redness, ear pain, ringing in the ears, wax buildup, runny nose, nasal congestion, bloody nose, or sore throat. Respiratory: Denies difficulty breathing, shortness of breath, cough or sputum production.   Cardiovascular: Denies chest pain, chest tightness, palpitations or swelling in the hands or feet.  Gastrointestinal: Denies abdominal pain, bloating, constipation, diarrhea or blood in the stool.  GU: Denies urgency, frequency, pain with urination, burning sensation, blood in urine, odor or discharge. Musculoskeletal: Denies decrease in range of motion, difficulty with gait, muscle pain or joint pain and swelling.  Skin: Denies redness, rashes, lesions or ulcercations.  Neurological: Denies dizziness, difficulty with memory, difficulty  with speech or problems with balance and coordination.  Psych: Pt has a history of anxiety. Denies depression, SI/HI.  No other specific complaints in a complete review of systems (except as listed in HPI above).     Objective:   Physical Exam   BP (!) 148/90   Pulse 92   Temp 98.2 F (36.8 C) (Temporal)   Ht 5\' 4"  (1.626 m)   Wt 109.8 kg   SpO2 98%   BMI 41.54 kg/m  Wt Readings from Last 3 Encounters:  11/18/18 109.8 kg  08/28/18 110.8 kg  03/22/18  107.9 kg    General: Appears her stated age, obese, in NAD. Skin: Warm, dry and intact. No ulcerations noted. HEENT: Head: normal shape and size; Eyes: sclera white, no icterus, conjunctiva pink, PERRLA and EOMs intact; Ears: Tm's gray and intact, normal light reflex;  Neck:  Neck supple, trachea midline. No masses, lumps present. Surgical scar present over anterior neck. Cardiovascular: Normal rate and rhythm. S1,S2 noted.  No murmur, rubs or gallops noted. No JVD or BLE edema. No carotid bruits noted. Pulmonary/Chest: Normal effort and positive vesicular breath sounds. No respiratory distress. No wheezes, rales or ronchi noted.  Abdomen: Soft and nontender. Normal bowel sounds. No distention or masses noted. Liver, spleen and kidneys non palpable. Musculoskeletal: Strength 5/5 BUE/BLE. No difficulty with gait.  Neurological: Alert and oriented. Cranial nerves II-XII grossly intact. Coordination normal.  Psychiatric: Mood and affect normal. Behavior is normal. Judgment and thought content normal.     BMET    Component Value Date/Time   NA 139 11/18/2018 1537   NA 138 03/21/2017 1018   NA 137 02/18/2013 1554   K 4.6 11/18/2018 1537   K 4.4 02/18/2013 1554   CL 100 11/18/2018 1537   CO2 30 11/18/2018 1537   CO2 25 02/18/2013 1554   GLUCOSE 136 (H) 11/18/2018 1537   GLUCOSE 300 (H) 02/18/2013 1554   BUN 16 11/18/2018 1537   BUN 16 03/21/2017 1018   BUN 14.2 02/18/2013 1554   CREATININE 0.61 11/18/2018 1537   CREATININE 0.67 07/16/2017 1442   CREATININE 0.8 02/18/2013 1554   CALCIUM 9.5 11/18/2018 1537   CALCIUM 9.2 02/18/2013 1554   GFRNONAA >60 08/28/2018 0800   GFRNONAA >60 07/16/2017 1442   GFRAA >60 08/28/2018 0800   GFRAA >60 07/16/2017 1442    Lipid Panel     Component Value Date/Time   CHOL 205 (H) 11/18/2018 1537   CHOL 122 03/21/2017 1018   TRIG 182.0 (H) 11/18/2018 1537   HDL 48.30 11/18/2018 1537   HDL 41 03/21/2017 1018   CHOLHDL 4 11/18/2018 1537   VLDL  36.4 11/18/2018 1537   LDLCALC 120 (H) 11/18/2018 1537   LDLCALC 63 03/21/2017 1018    CBC    Component Value Date/Time   WBC 10.2 11/18/2018 1537   RBC 5.32 (H) 11/18/2018 1537   HGB 14.8 11/18/2018 1537   HGB 13.0 07/16/2017 1442   HGB 13.5 03/21/2017 1018   HGB 12.5 02/18/2013 1554   HCT 45.4 11/18/2018 1537   HCT 42.0 03/21/2017 1018   HCT 39.6 02/18/2013 1554   PLT 246.0 11/18/2018 1537   PLT 274 07/16/2017 1442   PLT 306 02/18/2013 1554   MCV 85.5 11/18/2018 1537   MCV 86 03/21/2017 1018   MCV 82.6 02/18/2013 1554   MCH 27.1 08/28/2018 0800   MCHC 32.6 11/18/2018 1537   RDW 15.7 (H) 11/18/2018 1537   RDW 14.6 03/21/2017 1018  RDW 16.4 (H) 02/18/2013 1554   LYMPHSABS 1.5 08/28/2018 0800   LYMPHSABS 1.5 03/21/2017 1018   LYMPHSABS 2.0 02/18/2013 1554   MONOABS 0.4 08/28/2018 0800   MONOABS 0.6 02/18/2013 1554   EOSABS 0.1 08/28/2018 0800   EOSABS 0.1 03/21/2017 1018   BASOSABS 0.0 08/28/2018 0800   BASOSABS 0.0 03/21/2017 1018   BASOSABS 0.0 02/18/2013 1554    Hgb A1C Lab Results  Component Value Date   HGBA1C 9.4 (H) 11/18/2018      Assessment & Plan:   Preventative Health Maintenance:  Flu shot UTD She declines tetanus booster or pneumovax today Pap smear UTD Mammogram UTD Colon screening UTD Encouraged her to consume a balanced diet and exercise regimen Advised her to see an eye doctor and dentist annually Will check CBC, CMET, Lipid, TSH, Free T4, A1C and Vit D today  RTC in 6 months, follow up chronic conditions. [name], NP

## 2018-11-19 LAB — COMPREHENSIVE METABOLIC PANEL
ALT: 16 U/L (ref 0–35)
AST: 0 U/L (ref 0–37)
Albumin: 4 g/dL (ref 3.5–5.2)
Alkaline Phosphatase: 99 U/L (ref 39–117)
BUN: 16 mg/dL (ref 6–23)
CO2: 30 mEq/L (ref 19–32)
Calcium: 9.5 mg/dL (ref 8.4–10.5)
Chloride: 100 mEq/L (ref 96–112)
Creatinine, Ser: 0.61 mg/dL (ref 0.40–1.20)
GFR: 102.28 mL/min (ref >60.00)
Glucose, Bld: 136 mg/dL — ABNORMAL HIGH (ref 70–99)
Potassium: 4.6 mEq/L (ref 3.5–5.1)
Sodium: 139 mEq/L (ref 135–145)
Total Bilirubin: 0.4 mg/dL (ref 0.2–1.2)
Total Protein: 6.9 g/dL (ref 6.0–8.3)

## 2018-11-19 LAB — LIPID PANEL
Cholesterol: 205 mg/dL — ABNORMAL HIGH (ref 0–200)
HDL: 48.3 mg/dL (ref >39.00)
LDL Cholesterol: 120 mg/dL — ABNORMAL HIGH (ref 0–99)
NonHDL: 156.7
Total CHOL/HDL Ratio: 4
Triglycerides: 182 mg/dL — ABNORMAL HIGH (ref 0.0–149.0)
VLDL: 36.4 mg/dL (ref 0.0–40.0)

## 2018-11-19 LAB — CBC
HCT: 45.4 % (ref 36.0–46.0)
Hemoglobin: 14.8 g/dL (ref 12.0–15.0)
MCHC: 32.6 g/dL (ref 30.0–36.0)
MCV: 85.5 fl (ref 78.0–100.0)
Platelets: 246 10*3/uL (ref 150.0–400.0)
RBC: 5.32 Mil/uL — ABNORMAL HIGH (ref 3.87–5.11)
RDW: 15.7 % — ABNORMAL HIGH (ref 11.5–15.5)
WBC: 10.2 10*3/uL (ref 4.0–10.5)

## 2018-11-19 LAB — HEMOGLOBIN A1C: Hgb A1c MFr Bld: 9.4 % — ABNORMAL HIGH (ref 4.6–6.5)

## 2018-11-21 LAB — T4, FREE: Free T4: 1.18 ng/dL (ref 0.60–1.60)

## 2018-11-21 LAB — TSH: TSH: 1.38 u[IU]/mL (ref 0.35–4.50)

## 2018-11-21 LAB — VITAMIN D 25 HYDROXY (VIT D DEFICIENCY, FRACTURES): VITD: 20.97 ng/mL — ABNORMAL LOW (ref 30.00–100.00)

## 2018-11-24 ENCOUNTER — Encounter: Payer: Self-pay | Admitting: Internal Medicine

## 2018-11-24 NOTE — Assessment & Plan Note (Signed)
A1c today No urine microalbumin secondary to ARB therapy Encouraged her to consume a low carb diet and exercise for weight loss Continue meds per endocrinology Foot exam today Encouraged yearly eye exam She declines flu or Pneumovax today

## 2018-11-24 NOTE — Assessment & Plan Note (Signed)
CBC and C met today Continue Pantoprazole Discussed how weight loss could help improve reflux symptoms

## 2018-11-24 NOTE — Assessment & Plan Note (Signed)
In remission Continue Tamoxifen Continue to follow with Dr. Jana Hakim

## 2018-11-24 NOTE — Assessment & Plan Note (Signed)
C met and lipid profile today Encouraged her to consume a low-fat diet Continue Rosuvastatin for now 

## 2018-11-24 NOTE — Assessment & Plan Note (Signed)
Stable on Effexor Support offered today

## 2018-11-24 NOTE — Assessment & Plan Note (Signed)
Elevated but she has not taken her blood pressure medicine today Continue Losartan Reinforced DASH diet and exercise for weight loss C met today

## 2018-11-24 NOTE — Patient Instructions (Signed)
Health Maintenance, Female Adopting a healthy lifestyle and getting preventive care are important in promoting health and wellness. Ask your health care provider about:  The right schedule for you to have regular tests and exams.  Things you can do on your own to prevent diseases and keep yourself healthy. What should I know about diet, weight, and exercise? Eat a healthy diet   Eat a diet that includes plenty of vegetables, fruits, low-fat dairy products, and lean protein.  Do not eat a lot of foods that are high in solid fats, added sugars, or sodium. Maintain a healthy weight Body mass index (BMI) is used to identify weight problems. It estimates body fat based on height and weight. Your health care provider can help determine your BMI and help you achieve or maintain a healthy weight. Get regular exercise Get regular exercise. This is one of the most important things you can do for your health. Most adults should:  Exercise for at least 150 minutes each week. The exercise should increase your heart rate and make you sweat (moderate-intensity exercise).  Do strengthening exercises at least twice a week. This is in addition to the moderate-intensity exercise.  Spend less time sitting. Even light physical activity can be beneficial. Watch cholesterol and blood lipids Have your blood tested for lipids and cholesterol at 53 years of age, then have this test every 5 years. Have your cholesterol levels checked more often if:  Your lipid or cholesterol levels are high.  You are older than 53 years of age.  You are at high risk for heart disease. What should I know about cancer screening? Depending on your health history and family history, you may need to have cancer screening at various ages. This may include screening for:  Breast cancer.  Cervical cancer.  Colorectal cancer.  Skin cancer.  Lung cancer. What should I know about heart disease, diabetes, and high blood  pressure? Blood pressure and heart disease  High blood pressure causes heart disease and increases the risk of stroke. This is more likely to develop in people who have high blood pressure readings, are of African descent, or are overweight.  Have your blood pressure checked: ? Every 3-5 years if you are 18-39 years of age. ? Every year if you are 40 years old or older. Diabetes Have regular diabetes screenings. This checks your fasting blood sugar level. Have the screening done:  Once every three years after age 40 if you are at a normal weight and have a low risk for diabetes.  More often and at a younger age if you are overweight or have a high risk for diabetes. What should I know about preventing infection? Hepatitis B If you have a higher risk for hepatitis B, you should be screened for this virus. Talk with your health care provider to find out if you are at risk for hepatitis B infection. Hepatitis C Testing is recommended for:  Everyone born from 1945 through 1965.  Anyone with known risk factors for hepatitis C. Sexually transmitted infections (STIs)  Get screened for STIs, including gonorrhea and chlamydia, if: ? You are sexually active and are younger than 53 years of age. ? You are older than 53 years of age and your health care provider tells you that you are at risk for this type of infection. ? Your sexual activity has changed since you were last screened, and you are at increased risk for chlamydia or gonorrhea. Ask your health care provider if   you are at risk.  Ask your health care provider about whether you are at high risk for HIV. Your health care provider may recommend a prescription medicine to help prevent HIV infection. If you choose to take medicine to prevent HIV, you should first get tested for HIV. You should then be tested every 3 months for as long as you are taking the medicine. Pregnancy  If you are about to stop having your period (premenopausal) and  you may become pregnant, seek counseling before you get pregnant.  Take 400 to 800 micrograms (mcg) of folic acid every day if you become pregnant.  Ask for birth control (contraception) if you want to prevent pregnancy. Osteoporosis and menopause Osteoporosis is a disease in which the bones lose minerals and strength with aging. This can result in bone fractures. If you are 65 years old or older, or if you are at risk for osteoporosis and fractures, ask your health care provider if you should:  Be screened for bone loss.  Take a calcium or vitamin D supplement to lower your risk of fractures.  Be given hormone replacement therapy (HRT) to treat symptoms of menopause. Follow these instructions at home: Lifestyle  Do not use any products that contain nicotine or tobacco, such as cigarettes, e-cigarettes, and chewing tobacco. If you need help quitting, ask your health care provider.  Do not use street drugs.  Do not share needles.  Ask your health care provider for help if you need support or information about quitting drugs. Alcohol use  Do not drink alcohol if: ? Your health care provider tells you not to drink. ? You are pregnant, may be pregnant, or are planning to become pregnant.  If you drink alcohol: ? Limit how much you use to 0-1 drink a day. ? Limit intake if you are breastfeeding.  Be aware of how much alcohol is in your drink. In the [place] one drink equals one 12 oz bottle of beer (355 mL), one 5 oz glass of wine (148 mL), or one 1 oz glass of hard liquor (44 mL). General instructions  Schedule regular health, dental, and eye exams.  Stay current with your vaccines.  Tell your health care provider if: ? You often feel depressed. ? You have ever been abused or do not feel safe at home. Summary  Adopting a healthy lifestyle and getting preventive care are important in promoting health and wellness.  Follow your health care provider's instructions about healthy  diet, exercising, and getting tested or screened for diseases.  Follow your health care provider's instructions on monitoring your cholesterol and blood pressure. This information is not intended to replace advice given to you by your health care provider. Make sure you discuss any questions you have with your health care provider. Document Released: 07/25/2010 Document Revised: 01/02/2018 Document Reviewed: 01/02/2018 Elsevier Patient Education  2020 [employer]  

## 2018-11-24 NOTE — Assessment & Plan Note (Signed)
TSH and free T4 today Will defer to endocrinology for adjustment of Levothyroxine

## 2018-11-26 ENCOUNTER — Encounter: Payer: Self-pay | Admitting: Internal Medicine

## 2018-12-02 ENCOUNTER — Other Ambulatory Visit: Payer: Self-pay | Admitting: Internal Medicine

## 2018-12-03 MED ORDER — VITAMIN D (ERGOCALCIFEROL) 1.25 MG (50000 UNIT) PO CAPS: 50000.0000 [IU] | ORAL_CAPSULE | ORAL | 0 refills | Status: DC

## 2018-12-03 MED ORDER — ROSUVASTATIN CALCIUM 20 MG PO TABS: 20.0000 mg | ORAL_TABLET | Freq: Every day | ORAL | 0 refills | Status: DC

## 2018-12-03 NOTE — Addendum Note (Signed)
Addended by: Lurlean Nanny on: 12/03/2018 10:52 AM   Modules accepted: Orders

## 2018-12-16 ENCOUNTER — Other Ambulatory Visit: Payer: Self-pay | Admitting: *Deleted

## 2018-12-16 ENCOUNTER — Encounter: Payer: Self-pay | Admitting: Oncology

## 2018-12-16 MED ORDER — TAMOXIFEN CITRATE 20 MG PO TABS: 20.0000 mg | ORAL_TABLET | Freq: Every day | ORAL | 3 refills | Status: DC

## 2018-12-31 ENCOUNTER — Other Ambulatory Visit: Payer: Self-pay | Admitting: Podiatry

## 2019-01-06 ENCOUNTER — Telehealth: Payer: Self-pay | Admitting: *Deleted

## 2019-01-06 ENCOUNTER — Encounter: Payer: Self-pay | Admitting: Internal Medicine

## 2019-01-06 DIAGNOSIS — E119 Type 2 diabetes mellitus without complications: Secondary | ICD-10-CM

## 2019-01-06 NOTE — Telephone Encounter (Signed)
Left message with Lyrica orders at [facility].

## 2019-01-06 NOTE — Telephone Encounter (Signed)
Pt states she needs refills of Lyrica and an appt with Dr. Milinda Pointer in [place]. I told pt I would call in 2 months of the Lyrica and transfer to schedulers.

## 2019-01-07 ENCOUNTER — Other Ambulatory Visit: Payer: Self-pay | Admitting: Endocrinology

## 2019-01-20 ENCOUNTER — Encounter: Payer: Self-pay | Admitting: Internal Medicine

## 2019-01-20 DIAGNOSIS — E119 Type 2 diabetes mellitus without complications: Secondary | ICD-10-CM

## 2019-01-22 ENCOUNTER — Ambulatory Visit: Payer: No Typology Code available for payment source | Admitting: Podiatry

## 2019-01-22 ENCOUNTER — Other Ambulatory Visit: Payer: Self-pay

## 2019-01-22 ENCOUNTER — Encounter: Payer: Self-pay | Admitting: Podiatry

## 2019-01-22 DIAGNOSIS — G5793 Unspecified mononeuropathy of bilateral lower limbs: Secondary | ICD-10-CM

## 2019-01-22 MED ORDER — PREGABALIN 75 MG PO CAPS: 75.0000 mg | ORAL_CAPSULE | Freq: Two times a day (BID) | ORAL | 3 refills | Status: DC

## 2019-01-22 NOTE — Progress Notes (Signed)
She presents today states that occasionally she will have random sharp pains in her toes and states that her blood sugars been increasing but otherwise she is doing pretty well.  Objective: Vital signs are stable she is alert and oriented x3.  Pulses are palpable.  Neurologic sensorium slightly diminished per Semmes Weinstein monofilament is otherwise deep tendon reflexes are intact muscle strength is normal symmetrical inversion eversion dorsiflexion plantarflexion all intrinsic musculature appears to be intact orthopedic evaluation demonstrates all joints distal to the ankle, full range of motion without crepitation she does have a prominent fifth metatarsal head on the right foot that is resulting in some tenderness.  Cutaneous evaluation demonstrates supple hydrated cutis for the most part there are some areas of xerosis.  Most likely associated with diabetes.  Assessment: Diabetes mellitus.  Plan: Follow-up with me in 1 year.

## 2019-01-24 HISTORY — PX: BREAST BIOPSY: SHX20

## 2019-01-28 ENCOUNTER — Encounter: Payer: Self-pay | Admitting: Internal Medicine

## 2019-01-28 MED ORDER — LOSARTAN POTASSIUM 50 MG PO TABS: 50.0000 mg | ORAL_TABLET | Freq: Every day | ORAL | 1 refills | Status: DC

## 2019-01-31 ENCOUNTER — Other Ambulatory Visit: Payer: Self-pay

## 2019-01-31 ENCOUNTER — Encounter: Payer: Self-pay | Admitting: Endocrinology

## 2019-02-03 ENCOUNTER — Other Ambulatory Visit: Payer: Self-pay

## 2019-02-03 DIAGNOSIS — E1142 Type 2 diabetes mellitus with diabetic polyneuropathy: Secondary | ICD-10-CM

## 2019-02-03 DIAGNOSIS — Z794 Long term (current) use of insulin: Secondary | ICD-10-CM

## 2019-02-03 MED ORDER — FREESTYLE LITE TEST VI STRP: 1.0000 | ORAL_STRIP | Freq: Two times a day (BID) | 0 refills | Status: DC

## 2019-02-03 MED ORDER — FREESTYLE LITE DEVI: 1.0000 | Freq: Two times a day (BID) | 0 refills | Status: DC

## 2019-02-03 MED ORDER — FREESTYLE LANCETS MISC: 1.0000 | Freq: Two times a day (BID) | 0 refills | Status: AC

## 2019-02-07 ENCOUNTER — Other Ambulatory Visit: Payer: Self-pay

## 2019-02-11 ENCOUNTER — Encounter: Payer: Self-pay | Admitting: Endocrinology

## 2019-02-11 ENCOUNTER — Other Ambulatory Visit: Payer: Self-pay

## 2019-02-11 ENCOUNTER — Ambulatory Visit: Payer: No Typology Code available for payment source | Admitting: Endocrinology

## 2019-02-11 VITALS — BP 164/90 | HR 126 | Ht 64.0 in | Wt 243.2 lb

## 2019-02-11 DIAGNOSIS — Z794 Long term (current) use of insulin: Secondary | ICD-10-CM | POA: Diagnosis not present

## 2019-02-11 DIAGNOSIS — E1142 Type 2 diabetes mellitus with diabetic polyneuropathy: Secondary | ICD-10-CM

## 2019-02-11 LAB — POCT GLYCOSYLATED HEMOGLOBIN (HGB A1C): Hemoglobin A1C: 9.2 % — AB (ref 4.0–5.6)

## 2019-02-11 NOTE — Patient Instructions (Addendum)
Your blood pressure and heart rate are high today.  Please see your primary care provider soon, to have these rechecked. Please continue the same diabetes medications. check your blood sugar twice a day.  vary the time of day when you check, between before the 3 meals, and at bedtime.  also check if you have symptoms of your blood sugar being too high or too low.  please keep a record of the readings and bring it to your next appointment here (or you can bring the meter itself).  You can write it on any piece of paper.  please call us sooner if your blood sugar goes below 70, or if you have a lot of readings over 200.  Please come back for a follow-up appointment in 2 months.

## 2019-02-11 NOTE — Progress Notes (Signed)
Subjective:    Patient ID: Stacey Roberts, female    DOB: 09/26/1965, 54 y.o.   MRN: [id number]  HPI Pt has stage-1 papillary adenocarcinoma of the thyroid.    2/11: thyroidectomy: T1b N0 M0.  4/11: RAI 103 mCi, with thyrogen.  12/11: neck US: single enlarged right cervical lymph node, nonspecific.   6/12: body scan (thyrogen) neg.   12/12: [place]: lymph node is stable.  6/13: [place]: overall node morphology and volume show very little change.   9/14  TG undetectable (ab neg) 3/15: TG undetectable (ab neg) 10/15 TG undetectable (ab neg).  3/17 TG undetectable (ab neg).   4/19 TG undetectable (ab neg).   6/20 [place] no tumor Denies neck swelling.  Pt returns for f/u of diabetes mellitus:  DM type: Insulin-requiring type 2 Dx'ed: 0102 Complications: painful neuropathy of the lower extremities.  Therapy: insulin since early 7253 (also trulicity and 2 oral meds).   GDM: never.  DKA: never.   Severe hypoglycemia: never.   Pancreatitis: never.  Other: she declines multiple daily injections; edema precudes pioglitizone rx.  She declines bariatric surgery for now.  She declines to add bromocriptine.   Interval history: She never misses meds.  no cbg record, but states cbg varies from 150-240.  Nausea is improved.   Past Medical History:  Diagnosis Date  . Allergy    seasonal  . Breast cancer ([place]) 2014  . Breast cancer ([place]) 2017  . Bronchitis    hx of  . Diabetes mellitus, type II ([place])   . GERD (gastroesophageal reflux disease)   . Headache   . History of radiation therapy 12/01/15-01/11/16   Left breast DIBH / 50.4 Gy in 28 fractions and Left breast boost / 12 Gy in 6 fractions  . Hyperlipidemia   . Hypertension   . Hypothyroidism   . Joint pain   . Obesity   . PCOS (polycystic ovarian syndrome)   . Personal history of radiation therapy 2017  . Pneumonia    as a child  . Sleep apnea 2008   severe OSA-does not use a cpap  . Snoring   . 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 Buddy Duty    Past Surgical History:  Procedure Laterality Date  . BREAST BIOPSY  2000   s/p  . BREAST BIOPSY Left 01/22/2013   Procedure: RE-EXCISION LEFT BREAST DCIS;  Surgeon: Harl Bowie, MD;  Location: [facility];  Service: General;  Laterality: Left;  . BREAST LUMPECTOMY Left 01/06/2013   Procedure: LUMPECTOMY;  Surgeon: Harl Bowie, MD;  Location: [facility];  Service: General;  Laterality: Left;  . BREAST LUMPECTOMY Left 2017  . BREAST LUMPECTOMY WITH NEEDLE LOCALIZATION AND AXILLARY SENTINEL LYMPH NODE BX Left 09/30/2015   Procedure: Left BREAST LUMPECTOMY WITH NEEDLE LOCALIZATION AND AXILLARY SENTINEL LYMPH NODE BX;  Surgeon: Coralie Keens, MD;  Location: [facility];  Service: General;  Laterality: Left;  . BREAST RECONSTRUCTION Bilateral 10/12/2015   Procedure: BREAST oncoplastic RECONSTRUCTION;  Surgeon: Irene Limbo, MD;  Location: [facility];  Service: Plastics;  Laterality: Bilateral;  . BREAST REDUCTION SURGERY Bilateral 10/12/2015   Procedure: MAMMARY REDUCTION  (BREAST);  Surgeon: Irene Limbo, MD;  Location: [facility];  Service: Plastics;  Laterality: Bilateral;  . DILATION AND CURETTAGE OF UTERUS  2004   s/p  . EXCISION OF BREAST LESION Left 10/12/2015  Procedure: Re-EXCISION OF BREAST;  Surgeon: Coralie Keens, MD;  Location: [facility];  Service: General;  Laterality: Left;  . REDUCTION MAMMAPLASTY Bilateral 09/2015  . THYROIDECTOMY  02/2009   Dr Harlow Asa  . TONSILLECTOMY  1982    Social History   Socioeconomic History  . Marital status: Single    Spouse name: Not on file  . Number of children: Not on file  . Years of education: Not on file  . Highest education level: Not on file  Occupational History    Employer: [employer]    Comment: Outpatient scheduling in radiology  Tobacco  Use  . Smoking status: Former Smoker    Packs/day: 0.50    Types: Cigarettes    Quit date: 09/29/2007    Years since quitting: 11.3  . Smokeless tobacco: Never Used  . Tobacco comment: Smoked on and off for 20 years. Quit about 8 years ago (as of 08/2015)  Substance and Sexual Activity  . Alcohol use: Yes    Alcohol/week: 0.0 standard drinks    Comment: occasionally  . Drug use: No  . Sexual activity: Yes  Other Topics Concern  . Not on file  Social History Narrative   The patient is divorced, moved to [place] in   2006 from [place].  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    No alcohol.  Tobacco use:  She quit 5 years ago.  She smoked on   and off for approximately 20 years.  No history of recreational drug   Use.Drinks two cups of coffee per work day.    Social Determinants of Health   Financial Resource Strain:   . Difficulty of Paying Living Expenses: Not on file  Food Insecurity:   . Worried About Charity fundraiser in the Last Year: Not on file  . Ran Out of Food in the Last Year: Not on file  Transportation Needs:   . Lack of Transportation (Medical): Not on file  . Lack of Transportation (Non-Medical): Not on file  Physical Activity:   . Days of Exercise per Week: Not on file  . Minutes of Exercise per Session: Not on file  Stress:   . Feeling of Stress : Not on file  Social Connections:   . Frequency of Communication with Friends and Family: Not on file  . Frequency of Social Gatherings with Friends and Family: Not on file  . Attends Religious Services: Not on file  . Active Member of Clubs or Organizations: Not on file  . Attends Archivist Meetings: Not on file  . Marital Status: Not on file  Intimate Partner Violence:   . Fear of Current or Ex-Partner: Not on file  . Emotionally Abused: Not on file  . Physically Abused: Not on file  . Sexually  Abused: Not on file    Current Outpatient Medications on File Prior to Visit  Medication Sig Dispense Refill  . Blood Glucose Monitoring Suppl (FREESTYLE LITE) DEVI 1 each by Does not apply route 2 (two) times daily. [name] 1 each 0  . Cholecalciferol (EQL VITAMIN D3) 2000 units CAPS Take 2,000 Units daily by mouth.    . dapagliflozin propanediol (FARXIGA) 10 MG TABS tablet Take 10 mg by mouth daily. 90 tablet 3  . fluticasone (FLONASE) 50 MCG/ACT nasal spray Place 2 sprays into both nostrils daily. 16 g 0  .  gabapentin (NEURONTIN) 300 MG capsule Take 2 capsules (600 mg total) by mouth at bedtime. 180 capsule 4  . glucose blood (FREESTYLE LITE) test strip 1 each by Other route 2 (two) times daily. [name] 200 each 0  . Insulin Glargine (LANTUS SOLOSTAR) 100 UNIT/ML Solostar Pen Inject 75 Units into the skin every morning. And pen needles 1/day 30 pen 11  . Lancets (FREESTYLE) lancets 1 each by Other route 2 (two) times daily. [name] 200 each 0  . levothyroxine (SYNTHROID) 150 MCG tablet TAKE 1 TABLET BY MOUTH DAILY. 90 tablet 0  . losartan (COZAAR) 50 MG tablet Take 1 tablet (50 mg total) by mouth daily. 90 tablet 1  . metFORMIN (GLUCOPHAGE) 1000 MG tablet TAKE 1 TABLET (1,000 MG TOTAL) BY MOUTH 2 (TWO) TIMES DAILY WITH A MEAL. 180 tablet 3  . mupirocin ointment (BACTROBAN) 2 % Apply to wound after soaking BID 30 g 1  . pantoprazole (PROTONIX) 40 MG tablet TAKE 1 TABLET (40 MG TOTAL) BY MOUTH DAILY. MUST SCHEDULE PHYSICAL EXAM 90 tablet 3  . pregabalin (LYRICA) 75 MG capsule Take 1 capsule (75 mg total) by mouth 2 (two) times daily. 180 capsule 3  . rosuvastatin (CRESTOR) 20 MG tablet Take 1 tablet (20 mg total) by mouth daily. 90 tablet 0  . sodium chloride (OCEAN) 0.65 % SOLN nasal spray Place 1 spray into both nostrils as needed for congestion. 60 mL 0  . tamoxifen (NOLVADEX) 20 MG tablet Take 1 tablet (20 mg total) by mouth daily. 90 tablet 3  . TRULICITY 1.5 UT/6.5YY SOPN INJECT 1.5 MG INTO  THE SKIN ONCE A WEEK. 2 mL 7  . venlafaxine XR (EFFEXOR-XR) 37.5 MG 24 hr capsule Take 1 capsule (37.5 mg total) by mouth daily with breakfast. 90 capsule 4  . Vitamin D, Ergocalciferol, (DRISDOL) 1.25 MG (50000 UT) CAPS capsule Take 1 capsule (50,000 Units total) by mouth every 7 (seven) days. 12 capsule 0  . [DISCONTINUED] Calcium Carbonate 1500 MG TABS Take 1,500 mg by mouth daily.       No current facility-administered medications on file prior to visit.    Allergies  Allergen Reactions  . Penicillins Shortness Of Breath, Rash and Other (See Comments)    Has patient had a PCN reaction causing immediate rash, facial/tongue/throat swelling, SOB or lightheadedness with hypotension: Yes Has patient had a PCN reaction causing severe rash involving mucus membranes or skin necrosis: Yes Has patient had a PCN reaction that required hospitalization No Has patient had a PCN reaction occurring within the last 10 years: Yes If all of the above answers are "NO", then may proceed with Cephalosporin use.   . Clindamycin/Lincomycin Other (See Comments)    Severe stomach cramps  . Ace Inhibitors Other (See Comments) and Cough    REACTION: cough; denies airway involvement     Family History  Problem Relation Age of Onset  . Dementia Father        deceased age 8 secondary to dementia  . Bipolar disorder Father   . Emphysema Father   . Heart disease Father   . COPD Father        smoker  . Hypertension Father   . Depression Father   . Alcoholism Father   . CAD Mother 33       Died age 22 of CAD  . Heart disease Mother   . Graves' disease Mother   . Hypertension Mother   . Thyroid disease Mother   . CAD Maternal  Grandfather 60  . Heart disease Maternal Grandfather   . Other Sister 29       hysterectomy for fibroids  . Prostate cancer Brother        low grade; w/ surveillance  . Thyroid nodules Brother 46  . Fibroids Other        niece dx approx 53  . Hypertension Other   . Kidney  cancer Cousin        maternal 1st cousin dx 62-47; former smoker  . Lung cancer Other        maternal great aunt (MGM's sister); not a smoker  . Cancer Other        nephew dx neuroblastoma at 28.61 years old  . Colon cancer Neg Hx   . Colon polyps Neg Hx     BP (!) 164/90 (BP Location: Right Arm, Patient Position: Sitting, Cuff Size: Large)   Pulse (!) 126   Ht '5\' 4"'  (1.626 m)   Wt 243 lb 3.2 oz (110.3 kg)   SpO2 97%   BMI 41.75 kg/m    Review of Systems She denies hypoglycemia.      Objective:   Physical Exam VITAL SIGNS:  See vs page GENERAL: no distress Neck: a healed scar is present.  I do not appreciate a nodule in the thyroid or elsewhere in the neck Pulses: dorsalis pedis intact bilat.   MSK: no deformity of the feet CV: trace bilat leg edema Skin:  no ulcer on the feet.  normal color and temp on the feet. Neuro: sensation is intact to touch on the feet.   Lab Results  Component Value Date   HGBA1C 9.2 (A) 02/11/2019   Lab Results  Component Value Date   TSH 1.38 11/18/2018   T3TOTAL 143 03/21/2017       Assessment & Plan:  Insulin-requiring type 2 DM, with PN: she needs increased rx.  She declines to increase any of her meds Edema: This limits rx options Nausea: This also limits rx options HTN: is noted today.  PTC: no clinical evidence of recurrence.  HTN and tachycardia are noted today   Patient Instructions  Your blood pressure and heart rate are high today.  Please see your primary care provider soon, to have these rechecked. Please continue the same diabetes medications. check your blood sugar twice a day.  vary the time of day when you check, between before the 3 meals, and at bedtime.  also check if you have symptoms of your blood sugar being too high or too low.  please keep a record of the readings and bring it to your next appointment here (or you can bring the meter itself).  You can write it on any piece of paper.  please call us sooner if  your blood sugar goes below 70, or if you have a lot of readings over 200.  Please come back for a follow-up appointment in 2 months.

## 2019-03-03 ENCOUNTER — Other Ambulatory Visit: Payer: Self-pay | Admitting: Internal Medicine

## 2019-03-07 ENCOUNTER — Other Ambulatory Visit: Payer: Self-pay | Admitting: Endocrinology

## 2019-03-07 ENCOUNTER — Encounter: Payer: Self-pay | Admitting: Internal Medicine

## 2019-03-07 ENCOUNTER — Encounter: Payer: Self-pay | Admitting: Endocrinology

## 2019-03-07 MED ORDER — BASAGLAR KWIKPEN 100 UNIT/ML ~~LOC~~ SOPN: 75.0000 [IU] | PEN_INJECTOR | SUBCUTANEOUS | 3 refills | Status: DC

## 2019-03-10 NOTE — Progress Notes (Signed)
PCP: Jearld Fenton, NP   Chief Complaint  Patient presents with  . Gynecologic Exam    HPI:      Stacey Roberts is a 54 y.o. G0P0000 who LMP was No LMP recorded. (Menstrual status: IUD)., presents today for her NP annual examination.  Her menses are absent due to tamoxifen/Mirena IUD and possibly age.  Dysmenorrhea none. Mirena placed 08/2012 due to endometrial hyperplasia. Previous GYN suggested it stay in for 7 yrs (due for removal next yr). She has occas vasomotor sx improved with prn gabapentin.  Sex activity: not sexually active. She does not have vaginal dryness.  Last Pap: 10/18/17  Results were: no abnormalities /neg HPV DNA.  Hx of STDs: ext HPV lesion excised from labia with GYN a few yrs ago  Last mammogram: August 15, 2018  Results were: normal--routine follow-up in 12 months. Hx of breast cancer 2014 and 2017, on tamoxifen since 2017. Had neg cancer genetic testing 2017 per pt report. There is no FH of breast cancer. There is no FH of ovarian cancer. The patient does do self-breast exams.  Colonoscopy: age 42 without abnormalities; Repeat due after 10 years.   Tobacco use: The patient denies current or previous tobacco use. Alcohol use: none  No drug use Exercise: min active  She does not get adequate calcium and Vitamin D in her diet. Was on Rx Vit D by PCP but ran out. Hasn't heard back about continuing Rx.  Labs with PCP.   Past Medical History:  Diagnosis Date  . Allergy    seasonal  . BRCA negative 2017  . Breast cancer ([place]) 2014  . Breast cancer ([place] [facility]) 2017  . Bronchitis    hx of  . Diabetes mellitus, type II ([facility])   . Endometrial hyperplasia 2014   Mirena placed; Dr. Carren Rang  . GERD (gastroesophageal reflux disease)   . Headache   . History of radiation therapy 12/01/15-01/11/16   Left breast DIBH / 50.4 Gy in 28 fractions and Left breast boost / 12 Gy in 6 fractions  . Hyperlipidemia   . Hypertension   . Hypothyroidism   . Joint pain   .  Obesity   . PCOS (polycystic ovarian syndrome)   . Personal history of radiation therapy 2017  . Pneumonia    as a child  . Sleep apnea 2008   severe OSA-does not use a cpap  . Snoring   . 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 Buddy Duty    Past Surgical History:  Procedure Laterality Date  . BREAST BIOPSY  2000   s/p  . BREAST BIOPSY Left 01/22/2013   Procedure: RE-EXCISION LEFT BREAST DCIS;  Surgeon: Harl Bowie, MD;  Location: [facility];  Service: General;  Laterality: Left;  . BREAST LUMPECTOMY Left 01/06/2013   Procedure: LUMPECTOMY;  Surgeon: Harl Bowie, MD;  Location: [facility];  Service: General;  Laterality: Left;  . BREAST LUMPECTOMY Left 2017  . BREAST LUMPECTOMY WITH NEEDLE LOCALIZATION AND AXILLARY SENTINEL LYMPH NODE BX Left 09/30/2015   Procedure: Left BREAST LUMPECTOMY WITH NEEDLE LOCALIZATION AND AXILLARY SENTINEL LYMPH NODE BX;  Surgeon: Coralie Keens, MD;  Location: [facility];  Service: General;  Laterality: Left;  . BREAST RECONSTRUCTION Bilateral 10/12/2015   Procedure: BREAST oncoplastic RECONSTRUCTION;  Surgeon: Irene Limbo, MD;  Location: [facility];  Service: Plastics;  Laterality: Bilateral;  .  BREAST REDUCTION SURGERY Bilateral 10/12/2015   Procedure: MAMMARY REDUCTION  (BREAST);  Surgeon: Irene Limbo, MD;  Location: [facility];  Service: Plastics;  Laterality: Bilateral;  . DILATION AND CURETTAGE OF UTERUS  2004   s/p  . EXCISION OF BREAST LESION Left 10/12/2015   Procedure: Re-EXCISION OF BREAST;  Surgeon: Coralie Keens, MD;  Location: [facility];  Service: General;  Laterality: Left;  . REDUCTION MAMMAPLASTY Bilateral 09/2015  . THYROIDECTOMY  02/2009   Dr Harlow Asa  . TONSILLECTOMY  1982    Family History  Problem Relation Age of Onset  . Dementia Father        deceased  age 40 secondary to dementia  . Bipolar disorder Father   . Emphysema Father   . Heart disease Father   . COPD Father        smoker  . Hypertension Father   . Depression Father   . Alcoholism Father   . CAD Mother 26       Died age 24 of CAD  . Heart disease Mother   . Graves' disease Mother   . Hypertension Mother   . Thyroid disease Mother   . CAD Maternal Grandfather 60  . Heart disease Maternal Grandfather   . Other Sister 32       hysterectomy for fibroids  . Prostate cancer Brother        low grade; w/ surveillance  . Thyroid nodules Brother 40  . Fibroids Other        niece dx approx 36  . Hypertension Other   . Kidney cancer Cousin        maternal 1st cousin dx 13-47; former smoker  . Lung cancer Other        maternal great aunt (MGM's sister); not a smoker  . Cancer Other        nephew dx neuroblastoma at 11.58 years old  . Colon cancer Neg Hx   . Colon polyps Neg Hx     Social History   Socioeconomic History  . Marital status: Single    Spouse name: Not on file  . Number of children: Not on file  . Years of education: Not on file  . Highest education level: Not on file  Occupational History    Employer: [employer]    Comment: Outpatient scheduling in radiology  Tobacco Use  . Smoking status: Former Smoker    Packs/day: 0.50    Types: Cigarettes    Quit date: 09/29/2007    Years since quitting: 11.4  . Smokeless tobacco: Never Used  . Tobacco comment: Smoked on and off for 20 years. Quit about 8 years ago (as of 08/2015)  Substance and Sexual Activity  . Alcohol use: Yes    Alcohol/week: 0.0 standard drinks    Comment: occasionally  . Drug use: No  . Sexual activity: Not Currently    Birth control/protection: I.U.D.  Other Topics Concern  . Not on file  Social History Narrative   The patient is divorced, moved to [place] in   2006 from [place].  She does not have any children, currently lives   with her boyfriend and is working as a  Nurse, adult for [employer].    No alcohol.  Tobacco use:  She quit 5 years ago.  She smoked on   and off for approximately 20 years.  No history of recreational drug   Use.Drinks two cups of coffee per work day.  Social Determinants of Health   Financial Resource Strain:   . Difficulty of Paying Living Expenses: Not on file  Food Insecurity:   . Worried About Charity fundraiser in the Last Year: Not on file  . Ran Out of Food in the Last Year: Not on file  Transportation Needs:   . Lack of Transportation (Medical): Not on file  . Lack of Transportation (Non-Medical): Not on file  Physical Activity:   . Days of Exercise per Week: Not on file  . Minutes of Exercise per Session: Not on file  Stress:   . Feeling of Stress : Not on file  Social Connections:   . Frequency of Communication with Friends and Family: Not on file  . Frequency of Social Gatherings with Friends and Family: Not on file  . Attends Religious Services: Not on file  . Active Member of Clubs or Organizations: Not on file  . Attends Archivist Meetings: Not on file  . Marital Status: Not on file  Intimate Partner Violence:   . Fear of Current or Ex-Partner: Not on file  . Emotionally Abused: Not on file  . Physically Abused: Not on file  . Sexually Abused: Not on file     Current Outpatient Medications:  .  Blood Glucose Monitoring Suppl (FREESTYLE LITE) DEVI, 1 each by Does not apply route 2 (two) times daily. [name], Disp: 1 each, Rfl: 0 .  dapagliflozin propanediol (FARXIGA) 10 MG TABS tablet, Take 10 mg by mouth daily., Disp: 90 tablet, Rfl: 3 .  gabapentin (NEURONTIN) 300 MG capsule, Take 2 capsules (600 mg total) by mouth at bedtime., Disp: 180 capsule, Rfl: 4 .  glucose blood (FREESTYLE LITE) test strip, 1 each by Other route 2 (two) times daily. [name], Disp: 200 each, Rfl: 0 .  Insulin Glargine (BASAGLAR KWIKPEN) 100 UNIT/ML SOPN, Inject 0.75 mLs (75 Units total) into the skin every  morning., Disp: 25 pen, Rfl: 3 .  Lancets (FREESTYLE) lancets, 1 each by Other route 2 (two) times daily. [name], Disp: 200 each, Rfl: 0 .  levonorgestrel (MIRENA) 20 MCG/24HR IUD, 1 each by Intrauterine route once., Disp: , Rfl:  .  levothyroxine (SYNTHROID) 150 MCG tablet, TAKE 1 TABLET BY MOUTH DAILY., Disp: 90 tablet, Rfl: 0 .  losartan (COZAAR) 50 MG tablet, Take 1 tablet (50 mg total) by mouth daily., Disp: 90 tablet, Rfl: 1 .  metFORMIN (GLUCOPHAGE) 1000 MG tablet, TAKE 1 TABLET (1,000 MG TOTAL) BY MOUTH 2 (TWO) TIMES DAILY WITH A MEAL., Disp: 180 tablet, Rfl: 3 .  pantoprazole (PROTONIX) 40 MG tablet, TAKE 1 TABLET (40 MG TOTAL) BY MOUTH DAILY. MUST SCHEDULE PHYSICAL EXAM, Disp: 90 tablet, Rfl: 3 .  pregabalin (LYRICA) 75 MG capsule, Take 1 capsule (75 mg total) by mouth 2 (two) times daily., Disp: 180 capsule, Rfl: 3 .  rosuvastatin (CRESTOR) 20 MG tablet, TAKE 1 TABLET BY MOUTH DAILY., Disp: 90 tablet, Rfl: 0 .  tamoxifen (NOLVADEX) 20 MG tablet, Take 1 tablet (20 mg total) by mouth daily., Disp: 90 tablet, Rfl: 3 .  TRULICITY 1.5 TA/5.6PV SOPN, INJECT 1.5 MG INTO THE SKIN ONCE A WEEK., Disp: 2 mL, Rfl: 7 .  Vitamin D, Ergocalciferol, (DRISDOL) 1.25 MG (50000 UT) CAPS capsule, Take 1 capsule (50,000 Units total) by mouth every 7 (seven) days. (Patient not taking: Reported on 03/11/2019), Disp: 12 capsule, Rfl: 0     ROS:  Review of Systems  Constitutional: Negative for fatigue, fever and unexpected weight  change.  Respiratory: Negative for cough, shortness of breath and wheezing.   Cardiovascular: Negative for chest pain, palpitations and leg swelling.  Gastrointestinal: Negative for blood in stool, constipation, diarrhea, nausea and vomiting.  Endocrine: Negative for cold intolerance, heat intolerance and polyuria.  Genitourinary: Negative for dyspareunia, dysuria, flank pain, frequency, genital sores, hematuria, menstrual problem, pelvic pain, urgency, vaginal bleeding, vaginal  discharge and vaginal pain.  Musculoskeletal: Negative for back pain, joint swelling and myalgias.  Skin: Negative for rash.  Neurological: Negative for dizziness, syncope, light-headedness, numbness and headaches.  Hematological: Negative for adenopathy.  Psychiatric/Behavioral: Negative for agitation, confusion, sleep disturbance and suicidal ideas. The patient is not nervous/anxious.   BREAST: No symptoms   Objective: BP (!) 150/100   Ht '5\' 4"'  (1.626 m)   Wt 241 lb (109.3 kg)   BMI 41.37 kg/m    Physical Exam Constitutional:      Appearance: She is well-developed.  Genitourinary:     Vulva, vagina, cervix, uterus, right adnexa and left adnexa normal.     No vulval lesion or tenderness noted.     No vaginal discharge, erythema or tenderness.     No cervical polyp.     IUD strings visualized.     Uterus is not enlarged or tender.     No right or left adnexal mass present.     Right adnexa not tender.     Left adnexa not tender.  Neck:     Thyroid: No thyromegaly.  Cardiovascular:     Rate and Rhythm: Normal rate and regular rhythm.     Heart sounds: Normal heart sounds. No murmur.  Pulmonary:     Effort: Pulmonary effort is normal.     Breath sounds: Normal breath sounds.  Chest:     Breasts:        Right: No mass, nipple discharge, skin change or tenderness.        Left: No mass, nipple discharge, skin change or tenderness.  Abdominal:     Palpations: Abdomen is soft.     Tenderness: There is no abdominal tenderness. There is no guarding.  Musculoskeletal:        General: Normal range of motion.     Cervical back: Normal range of motion.  Neurological:     General: No focal deficit present.     Mental Status: She is alert and oriented to person, place, and time.     Cranial Nerves: No cranial nerve deficit.  Skin:    General: Skin is warm and dry.  Psychiatric:        Mood and Affect: Mood normal.        Behavior: Behavior normal.        Thought Content:  Thought content normal.        Judgment: Judgment normal.  Vitals reviewed.     Assessment/Plan:  Encounter for annual routine gynecological examination  Encounter for routine checking of intrauterine contraceptive device (IUD)--IUD in place. Discuss removal next yr. Placed for endometrial hyperplasia.   Endometrial hyperplasia--controlled with Mirena, placed 2014.  Malignant neoplasm of central portion of left breast in female, estrogen receptor positive (HCC)--on tamoxifen, followed by oncology; BRCA neg per pt report.          GYN counsel breast self exam, mammography screening, menopause, adequate intake of calcium and vitamin D, diet and exercise    F/U  Return in about 1 year (around 03/10/2020).  Khamryn Calderone B. Deneisha Dade, PA-C 03/11/2019 9:32 AM

## 2019-03-11 ENCOUNTER — Other Ambulatory Visit: Payer: Self-pay

## 2019-03-11 ENCOUNTER — Ambulatory Visit (INDEPENDENT_AMBULATORY_CARE_PROVIDER_SITE_OTHER): Payer: No Typology Code available for payment source | Admitting: Obstetrics and Gynecology

## 2019-03-11 ENCOUNTER — Encounter: Payer: Self-pay | Admitting: Obstetrics and Gynecology

## 2019-03-11 VITALS — BP 150/100 | Ht 64.0 in | Wt 241.0 lb

## 2019-03-11 DIAGNOSIS — Z30431 Encounter for routine checking of intrauterine contraceptive device: Secondary | ICD-10-CM

## 2019-03-11 DIAGNOSIS — Z170 Estrogen receptor positive status [ER+]: Secondary | ICD-10-CM

## 2019-03-11 DIAGNOSIS — Z01419 Encounter for gynecological examination (general) (routine) without abnormal findings: Secondary | ICD-10-CM

## 2019-03-11 DIAGNOSIS — C50112 Malignant neoplasm of central portion of left female breast: Secondary | ICD-10-CM | POA: Diagnosis not present

## 2019-03-11 DIAGNOSIS — Z975 Presence of (intrauterine) contraceptive device: Secondary | ICD-10-CM

## 2019-03-11 NOTE — Patient Instructions (Signed)
I value your feedback and entrusting us with your care. If you get a [facility] patient survey, I would appreciate you taking the time to let us know about your experience today. Thank you!  As of January 02, 2019, your lab results will be released to your MyChart immediately, before I even have a chance to see them. Please give me time to review them and contact you if there are any abnormalities. Thank you for your patience.  

## 2019-03-21 ENCOUNTER — Other Ambulatory Visit: Payer: Self-pay

## 2019-03-21 ENCOUNTER — Other Ambulatory Visit (INDEPENDENT_AMBULATORY_CARE_PROVIDER_SITE_OTHER): Payer: No Typology Code available for payment source

## 2019-03-21 DIAGNOSIS — E559 Vitamin D deficiency, unspecified: Secondary | ICD-10-CM | POA: Diagnosis not present

## 2019-03-21 DIAGNOSIS — E785 Hyperlipidemia, unspecified: Secondary | ICD-10-CM

## 2019-03-21 LAB — LIPID PANEL
Cholesterol: 104 mg/dL (ref 0–200)
HDL: 35.5 mg/dL — ABNORMAL LOW (ref >39.00)
LDL Cholesterol: 34 mg/dL (ref 0–99)
NonHDL: 68.18
Total CHOL/HDL Ratio: 3
Triglycerides: 172 mg/dL — ABNORMAL HIGH (ref 0.0–149.0)
VLDL: 34.4 mg/dL (ref 0.0–40.0)

## 2019-03-21 LAB — VITAMIN D 25 HYDROXY (VIT D DEFICIENCY, FRACTURES): VITD: 37.17 ng/mL (ref 30.00–100.00)

## 2019-04-07 ENCOUNTER — Other Ambulatory Visit: Payer: Self-pay | Admitting: Endocrinology

## 2019-04-10 ENCOUNTER — Other Ambulatory Visit: Payer: Self-pay

## 2019-04-10 ENCOUNTER — Encounter: Payer: Self-pay | Admitting: Internal Medicine

## 2019-04-10 DIAGNOSIS — E1142 Type 2 diabetes mellitus with diabetic polyneuropathy: Secondary | ICD-10-CM

## 2019-04-14 ENCOUNTER — Other Ambulatory Visit: Payer: Self-pay

## 2019-04-14 ENCOUNTER — Ambulatory Visit: Payer: No Typology Code available for payment source | Admitting: Endocrinology

## 2019-04-14 ENCOUNTER — Encounter: Payer: Self-pay | Admitting: Endocrinology

## 2019-04-14 VITALS — BP 132/70 | HR 98 | Ht 64.0 in | Wt 234.6 lb

## 2019-04-14 DIAGNOSIS — C73 Malignant neoplasm of thyroid gland: Secondary | ICD-10-CM | POA: Diagnosis not present

## 2019-04-14 DIAGNOSIS — E1142 Type 2 diabetes mellitus with diabetic polyneuropathy: Secondary | ICD-10-CM | POA: Diagnosis not present

## 2019-04-14 DIAGNOSIS — Z794 Long term (current) use of insulin: Secondary | ICD-10-CM

## 2019-04-14 LAB — TSH: TSH: 0.34 u[IU]/mL — ABNORMAL LOW (ref 0.35–4.50)

## 2019-04-14 LAB — POCT GLYCOSYLATED HEMOGLOBIN (HGB A1C): Hemoglobin A1C: 7.9 % — AB (ref 4.0–5.6)

## 2019-04-14 MED ORDER — BASAGLAR KWIKPEN 100 UNIT/ML ~~LOC~~ SOPN: 50.0000 [IU] | PEN_INJECTOR | SUBCUTANEOUS | 3 refills | Status: DC

## 2019-04-14 NOTE — Progress Notes (Signed)
Subjective:    Patient ID: Stacey Roberts, female    DOB: December 03, 1965, 54 y.o.   MRN: [id number]  HPI Pt has stage-1 papillary adenocarcinoma of the thyroid.    2/11: thyroidectomy: T1b N0 M0.  4/11: RAI 103 mCi, with thyrogen.  12/11: neck US: single enlarged right cervical lymph node, nonspecific.   6/12: body scan (thyrogen) neg.   12/12: [place]: lymph node is stable.  6/13: [place]: overall node morphology and volume show very little change.   9/14  TG undetectable (ab neg) 3/15: TG undetectable (ab neg) 10/15 TG undetectable (ab neg).  3/17 TG undetectable (ab neg).   4/19 TG undetectable (ab neg).   6/20 [place] no tumor 2/20 TG undetectable (ab neg).   Goal TSH is normal, due to loig disease-free interval. Denies neck swelling.  Pt returns for f/u of diabetes mellitus:  DM type: Insulin-requiring type 2 Dx'ed: 3568 Complications: painful neuropathy of the lower extremities.  Therapy: insulin since early 6168, Trulicity, and 2 oral meds.   GDM: never.  DKA: never.   Severe hypoglycemia: never.   Pancreatitis: never.  Other: she declines multiple daily injections; edema precudes pioglitizone rx.  She declines bariatric surgery for now.  She declines to add bromocriptine.   Interval history: She never misses meds.  no cbg record, but states cbg varies from 120-184.  She stopped Trulicity, due to nausea.  She takes 50 units qam.   Past Medical History:  Diagnosis Date  . Allergy    seasonal  . BRCA negative 2017  . Breast cancer ([place]) 2014  . Breast cancer ([place] [facility]) 2017  . Bronchitis    hx of  . Diabetes mellitus, type II ([place])   . Endometrial hyperplasia 2014   Mirena placed; Dr. Carren Rang  . GERD (gastroesophageal reflux disease)   . Headache   . History of radiation therapy 12/01/15-01/11/16   Left breast DIBH / 50.4 Gy in 28 fractions and Left breast boost / 12 Gy in 6 fractions  . Hyperlipidemia   . Hypertension   . Hypothyroidism   . Joint pain   . Obesity   . PCOS  (polycystic ovarian syndrome)   . Personal history of radiation therapy 2017  . Pneumonia    as a child  . Sleep apnea 2008   severe OSA-does not use a cpap  . Snoring   . 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 Buddy Duty    Past Surgical History:  Procedure Laterality Date  . BREAST BIOPSY  2000   s/p  . BREAST BIOPSY Left 01/22/2013   Procedure: RE-EXCISION LEFT BREAST DCIS;  Surgeon: Harl Bowie, MD;  Location: [facility];  Service: General;  Laterality: Left;  . BREAST LUMPECTOMY Left 01/06/2013   Procedure: LUMPECTOMY;  Surgeon: Harl Bowie, MD;  Location: [facility];  Service: General;  Laterality: Left;  . BREAST LUMPECTOMY Left 2017  . BREAST LUMPECTOMY WITH NEEDLE LOCALIZATION AND AXILLARY SENTINEL LYMPH NODE BX Left 09/30/2015   Procedure: Left BREAST LUMPECTOMY WITH NEEDLE LOCALIZATION AND AXILLARY SENTINEL LYMPH NODE BX;  Surgeon: Coralie Keens, MD;  Location: [facility];  Service: General;  Laterality: Left;  . BREAST RECONSTRUCTION Bilateral 10/12/2015   Procedure: BREAST oncoplastic RECONSTRUCTION;  Surgeon: Irene Limbo, MD;  Location: [facility];  Service: Plastics;  Laterality: Bilateral;  . BREAST REDUCTION SURGERY Bilateral 10/12/2015   Procedure: MAMMARY REDUCTION  (  BREAST);  Surgeon: Irene Limbo, MD;  Location: [facility];  Service: Plastics;  Laterality: Bilateral;  . DILATION AND CURETTAGE OF UTERUS  2004   s/p  . EXCISION OF BREAST LESION Left 10/12/2015   Procedure: Re-EXCISION OF BREAST;  Surgeon: Coralie Keens, MD;  Location: [facility];  Service: General;  Laterality: Left;  . REDUCTION MAMMAPLASTY Bilateral 09/2015  . THYROIDECTOMY  02/2009   Dr Harlow Asa  . TONSILLECTOMY  1982    Social History   Socioeconomic History  . Marital status: Single    Spouse name: Not on file  .  Number of children: Not on file  . Years of education: Not on file  . Highest education level: Not on file  Occupational History    Employer: [employer]    Comment: Outpatient scheduling in radiology  Tobacco Use  . Smoking status: Former Smoker    Packs/day: 0.50    Types: Cigarettes    Quit date: 09/29/2007    Years since quitting: 11.5  . Smokeless tobacco: Never Used  . Tobacco comment: Smoked on and off for 20 years. Quit about 8 years ago (as of 08/2015)  Substance and Sexual Activity  . Alcohol use: Yes    Alcohol/week: 0.0 standard drinks    Comment: occasionally  . Drug use: No  . Sexual activity: Not Currently    Birth control/protection: I.U.D.  Other Topics Concern  . Not on file  Social History Narrative   The patient is divorced, moved to [place] in   2006 from [place].  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    No alcohol.  Tobacco use:  She quit 5 years ago.  She smoked on   and off for approximately 20 years.  No history of recreational drug   Use.Drinks two cups of coffee per work day.    Social Determinants of Health   Financial Resource Strain:   . Difficulty of Paying Living Expenses:   Food Insecurity:   . Worried About Charity fundraiser in the Last Year:   . Arboriculturist in the Last Year:   Transportation Needs:   . Film/video editor (Medical):   Marland Kitchen Lack of Transportation (Non-Medical):   Physical Activity:   . Days of Exercise per Week:   . Minutes of Exercise per Session:   Stress:   . Feeling of Stress :   Social Connections:   . Frequency of Communication with Friends and Family:   . Frequency of Social Gatherings with Friends and Family:   . Attends Religious Services:   . Active Member of Clubs or Organizations:   . Attends Archivist Meetings:   Marland Kitchen Marital Status:   Intimate Partner Violence:   . Fear of Current or  Ex-Partner:   . Emotionally Abused:   Marland Kitchen Physically Abused:   . Sexually Abused:     Current Outpatient Medications on File Prior to Visit  Medication Sig Dispense Refill  . Blood Glucose Monitoring Suppl (FREESTYLE LITE) DEVI 1 each by Does not apply route 2 (two) times daily. [name] 1 each 0  . dapagliflozin propanediol (FARXIGA) 10 MG TABS tablet Take 10 mg by mouth daily. 90 tablet 3  . gabapentin (NEURONTIN) 300 MG capsule Take 2 capsules (600 mg total) by mouth at bedtime. 180 capsule 4  . glucose blood (FREESTYLE LITE)  test strip 1 each by Other route 2 (two) times daily. [name] 200 each 0  . Insulin Glargine (BASAGLAR KWIKPEN) 100 UNIT/ML SOPN Inject 0.75 mLs (75 Units total) into the skin every morning. 25 pen 3  . Lancets (FREESTYLE) lancets 1 each by Other route 2 (two) times daily. [name] 200 each 0  . levonorgestrel (MIRENA) 20 MCG/24HR IUD 1 each by Intrauterine route once.    Marland Kitchen levothyroxine (SYNTHROID) 150 MCG tablet TAKE 1 TABLET BY MOUTH DAILY. 90 tablet 0  . losartan (COZAAR) 50 MG tablet Take 1 tablet (50 mg total) by mouth daily. 90 tablet 1  . metFORMIN (GLUCOPHAGE) 1000 MG tablet TAKE 1 TABLET BY MOUTH 2 TIMES DAILY WITH A MEAL. 180 tablet 0  . pantoprazole (PROTONIX) 40 MG tablet TAKE 1 TABLET (40 MG TOTAL) BY MOUTH DAILY. MUST SCHEDULE PHYSICAL EXAM 90 tablet 3  . pregabalin (LYRICA) 75 MG capsule Take 1 capsule (75 mg total) by mouth 2 (two) times daily. 180 capsule 3  . rosuvastatin (CRESTOR) 20 MG tablet TAKE 1 TABLET BY MOUTH DAILY. 90 tablet 0  . tamoxifen (NOLVADEX) 20 MG tablet Take 1 tablet (20 mg total) by mouth daily. 90 tablet 3  . Vitamin D, Ergocalciferol, (DRISDOL) 1.25 MG (50000 UT) CAPS capsule Take 1 capsule (50,000 Units total) by mouth every 7 (seven) days. 12 capsule 0  . [DISCONTINUED] Calcium Carbonate 1500 MG TABS Take 1,500 mg by mouth daily.       No current facility-administered medications on file prior to visit.    Allergies  Allergen  Reactions  . Penicillins Shortness Of Breath, Rash and Other (See Comments)    Has patient had a PCN reaction causing immediate rash, facial/tongue/throat swelling, SOB or lightheadedness with hypotension: Yes Has patient had a PCN reaction causing severe rash involving mucus membranes or skin necrosis: Yes Has patient had a PCN reaction that required hospitalization No Has patient had a PCN reaction occurring within the last 10 years: Yes If all of the above answers are "NO", then may proceed with Cephalosporin use.   . Clindamycin/Lincomycin Other (See Comments)    Severe stomach cramps  . Ace Inhibitors Other (See Comments) and Cough    REACTION: cough; denies airway involvement     Family History  Problem Relation Age of Onset  . Dementia Father        deceased age 25 secondary to dementia  . Bipolar disorder Father   . Emphysema Father   . Heart disease Father   . COPD Father        smoker  . Hypertension Father   . Depression Father   . Alcoholism Father   . CAD Mother 39       Died age 69 of CAD  . Heart disease Mother   . Graves' disease Mother   . Hypertension Mother   . Thyroid disease Mother   . CAD Maternal Grandfather 60  . Heart disease Maternal Grandfather   . Other Sister 84       hysterectomy for fibroids  . Prostate cancer Brother        low grade; w/ surveillance  . Thyroid nodules Brother 55  . Fibroids Other        niece dx approx 57  . Hypertension Other   . Kidney cancer Cousin        maternal 1st cousin dx 9-47; former smoker  . Lung cancer Other        maternal great aunt (  MGM's sister); not a smoker  . Cancer Other        nephew dx neuroblastoma at 10.18 years old  . Colon cancer Neg Hx   . Colon polyps Neg Hx     BP 132/70   Pulse 98   Ht '5\' 4"'  (1.626 m)   Wt 234 lb 9.6 oz (106.4 kg)   SpO2 96%   BMI 40.27 kg/m    Review of Systems She denies hypoglycemia.      Objective:   Physical Exam VITAL SIGNS:  See vs page GENERAL:  no distress Pulses: dorsalis pedis intact bilat.   MSK: no deformity of the feet CV: trace bilat leg edema Skin:  no ulcer on the feet.  normal color and temp on the feet. Neuro: sensation is intact to touch on the feet.    Lab Results  Component Value Date   HGBA1C 7.9 (A) 04/14/2019        Assessment & Plan:  Insulin-requiring type 2 DM: She would benefit from increased rx, if it can be done with a regimen that avoids or minimizes hypoglycemia. Nausea:  We discussed.  She wants to stay off Trulicity Edema: This limits rx options PTC: due for recheck

## 2019-04-14 NOTE — Addendum Note (Signed)
Addended by: Kaylyn Lim I on: 04/14/2019 08:17 AM   Modules accepted: Orders

## 2019-04-14 NOTE — Patient Instructions (Addendum)
Blood tests are requested for you today.  We'll let you know about the results.  Please stay off the Trulicity Please continue the same other medications check your blood sugar twice a day.  vary the time of day when you check, between before the 3 meals, and at bedtime.  also check if you have symptoms of your blood sugar being too high or too low.  please keep a record of the readings and bring it to your next appointment here (or you can bring the meter itself).  You can write it on any piece of paper.  please call us sooner if your blood sugar goes below 70, or if you have a lot of readings over 200.  Please come back for a follow-up appointment in 2 months.

## 2019-04-15 ENCOUNTER — Other Ambulatory Visit: Payer: Self-pay | Admitting: Endocrinology

## 2019-04-15 LAB — THYROGLOBULIN LEVEL: Thyroglobulin: <0.1 ng/mL — ABNORMAL LOW

## 2019-04-15 LAB — THYROGLOBULIN ANTIBODY: Thyroglobulin Ab: <1 IU/mL (ref <=1)

## 2019-04-15 MED ORDER — LEVOTHYROXINE SODIUM 137 MCG PO TABS: 137.0000 ug | ORAL_TABLET | Freq: Every day | ORAL | 3 refills | Status: DC

## 2019-05-19 ENCOUNTER — Encounter: Payer: Self-pay | Admitting: Internal Medicine

## 2019-05-19 ENCOUNTER — Emergency Department (HOSPITAL_COMMUNITY)
Admission: EM | Admit: 2019-05-19 | Discharge: 2019-05-19 | Disposition: A | Discharge status: Home / Self Care | Payer: No Typology Code available for payment source | Attending: Emergency Medicine | Admitting: Emergency Medicine

## 2019-05-19 ENCOUNTER — Other Ambulatory Visit: Payer: Self-pay

## 2019-05-19 ENCOUNTER — Emergency Department (HOSPITAL_COMMUNITY): Payer: No Typology Code available for payment source

## 2019-05-19 DIAGNOSIS — Z87891 Personal history of nicotine dependence: Secondary | ICD-10-CM | POA: Diagnosis not present

## 2019-05-19 DIAGNOSIS — R002 Palpitations: Secondary | ICD-10-CM | POA: Diagnosis not present

## 2019-05-19 DIAGNOSIS — E119 Type 2 diabetes mellitus without complications: Secondary | ICD-10-CM | POA: Diagnosis not present

## 2019-05-19 DIAGNOSIS — Z85850 Personal history of malignant neoplasm of thyroid: Secondary | ICD-10-CM | POA: Insufficient documentation

## 2019-05-19 DIAGNOSIS — R079 Chest pain, unspecified: Secondary | ICD-10-CM | POA: Diagnosis present

## 2019-05-19 DIAGNOSIS — Z7984 Long term (current) use of oral hypoglycemic drugs: Secondary | ICD-10-CM | POA: Diagnosis not present

## 2019-05-19 DIAGNOSIS — I10 Essential (primary) hypertension: Secondary | ICD-10-CM | POA: Insufficient documentation

## 2019-05-19 DIAGNOSIS — Z79899 Other long term (current) drug therapy: Secondary | ICD-10-CM | POA: Insufficient documentation

## 2019-05-19 LAB — BASIC METABOLIC PANEL
Anion gap: 12 (ref 5–15)
BUN: 13 mg/dL (ref 6–20)
CO2: 25 mmol/L (ref 22–32)
Calcium: 9 mg/dL (ref 8.9–10.3)
Chloride: 103 mmol/L (ref 98–111)
Creatinine, Ser: 0.69 mg/dL (ref 0.44–1.00)
GFR calc Af Amer: >60 mL/min (ref >60)
GFR calc non Af Amer: >60 mL/min (ref >60)
Glucose, Bld: 200 mg/dL — ABNORMAL HIGH (ref 70–99)
Potassium: 4.3 mmol/L (ref 3.5–5.1)
Sodium: 140 mmol/L (ref 135–145)

## 2019-05-19 LAB — CBC
HCT: 45.8 % (ref 36.0–46.0)
Hemoglobin: 14 g/dL (ref 12.0–15.0)
MCH: 26.9 pg (ref 26.0–34.0)
MCHC: 30.6 g/dL (ref 30.0–36.0)
MCV: 87.9 fL (ref 80.0–100.0)
Platelets: 255 10*3/uL (ref 150–400)
RBC: 5.21 MIL/uL — ABNORMAL HIGH (ref 3.87–5.11)
RDW: 14 % (ref 11.5–15.5)
WBC: 9.7 10*3/uL (ref 4.0–10.5)
nRBC: 0 % (ref 0.0–0.2)

## 2019-05-19 LAB — TROPONIN I (HIGH SENSITIVITY)
Troponin I (High Sensitivity): 4 ng/L (ref <18)
Troponin I (High Sensitivity): <2 ng/L (ref <18)

## 2019-05-19 LAB — I-STAT BETA HCG BLOOD, ED (MC, WL, AP ONLY): I-stat hCG, quantitative: <5 m[IU]/mL (ref <5)

## 2019-05-19 MED ORDER — SODIUM CHLORIDE 0.9% FLUSH: 3.0000 mL | Freq: Once | INTRAVENOUS | Status: DC

## 2019-05-19 NOTE — ED Triage Notes (Signed)
On Friday, pt had multiple episodes of palpitations/rapid heart rate followed by dizziness. Episodes continued throughout yesterday and today feels squeezing sensation in her L chest. Endorses exertional shortness of breath starting this morning.

## 2019-05-19 NOTE — Discharge Instructions (Addendum)
Follow-up with the cardiology office to be evaluated.  The marijuana put you on a Holter monitor, something that would monitor your heart rhythm for a long period of time.  Please return for worsening or persistent symptoms.

## 2019-05-19 NOTE — ED Provider Notes (Signed)
I see the patient in signout from Dr. Alvino Chapel.  Briefly the patient is a 54 year old female with a chief complaint of palpitations.  She has some chest discomfort.  Completely atypical of ACS.  Delta troponin is negative.  Discharge home.  PCP follow-up.    "The cardiac monitor revealed normal sinus rhythm as interpreted by me. The cardiac monitor was ordered secondary to the patient's history of palpitations and to monitor the patient for dysrhythmia."     Deno Etienne, DO 05/19/19 1549

## 2019-05-19 NOTE — ED Provider Notes (Signed)
[facility] EMERGENCY DEPARTMENT Provider Note   CSN: [id number] Arrival date & time: 05/19/19  2505     History Chief Complaint  Patient presents with  . Palpitations    Stacey Roberts is a 54 y.o. female.  HPI  HPI: A 54 year old patient with a history of treated diabetes, hypertension and obesity presents for evaluation of chest pain. Initial onset of pain was more than 6 hours ago. The patient's chest pain is well-localized, is sharp and is not worse with exertion. The patient's chest pain is middle- or left-sided, is not described as heaviness/pressure/tightness and does not radiate to the arms/jaw/neck. The patient does not complain of nausea and denies diaphoresis. The patient has a family history of coronary artery disease in a first-degree relative with onset less than age 59. The patient has no history of stroke, has no history of peripheral artery disease, has not smoked in the past 90 days and has no history of hypercholesterolemia.    Patient presents with chest pain and palpitations.  States that she feels her heart race for the last 3 to 4 days.  States will come in last a few minutes.  States she will feel her heart pounding and going fast.  States she will feel her vision narrowed down with a little bit.  States that is cleared up pretty much yesterday but today has felt issues where she feels a tightness.  States almost feels that someone squeezes on her heart and months ago.  States however it just lasts a second or 2.  Not exertional.  Patient states she is worried because 2 of her siblings have had coronary artery bypasses.  Patient states she is previously had a negative stress test when she had some palpitations.  Past Medical History:  Diagnosis Date  . Allergy    seasonal  . BRCA negative 2017  . Breast cancer ([place]) 2014  . Breast cancer ([place]) 2017  . Bronchitis    hx of  . Diabetes mellitus, type II ([place])   . Endometrial hyperplasia 2014   Mirena placed; Dr. Carren Rang  . GERD (gastroesophageal reflux disease)   . Headache   . History of radiation therapy 12/01/15-01/11/16   Left breast DIBH / 50.4 Gy in 28 fractions and Left breast boost / 12 Gy in 6 fractions  . Hyperlipidemia   . Hypertension   . Hypothyroidism   . Joint pain   . Obesity   . PCOS (polycystic ovarian syndrome)   . Personal history of radiation therapy 2017  . Pneumonia    as a child  . Sleep apnea 2008   severe OSA-does not use a cpap  . Snoring   . 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 Buddy Duty    Patient Active Problem List   Diagnosis Date Noted  . GERD (gastroesophageal reflux disease) 07/20/2015  . Diabetes ([place]) 04/03/2015  . Anxiety and depression 09/09/2013  . Malignant neoplasm of central portion of left breast in female, estrogen receptor positive ([place]) 02/05/2013  . 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 ADENOCARCINOMA, THYROID GLAND, 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  . Dyslipidemia 12/18/2006  . Essential hypertension 12/13/2006    Past Surgical History:  Procedure Laterality Date  . BREAST BIOPSY  2000   s/p  . BREAST BIOPSY Left 01/22/2013   Procedure: RE-EXCISION LEFT BREAST DCIS;  Surgeon: Harl Bowie, MD;  Location: [place];  Service: General;  Laterality: Left;  . BREAST LUMPECTOMY Left 01/06/2013   Procedure: LUMPECTOMY;  Surgeon: Harl Bowie, MD;  Location: [place];  Service: General;  Laterality: Left;  . BREAST LUMPECTOMY Left 2017  . BREAST LUMPECTOMY WITH NEEDLE LOCALIZATION AND AXILLARY SENTINEL LYMPH NODE BX Left 09/30/2015   Procedure: Left BREAST LUMPECTOMY WITH NEEDLE LOCALIZATION AND AXILLARY SENTINEL LYMPH NODE BX;  Surgeon: Coralie Keens, MD;  Location: [place];  Service: General;  Laterality: Left;  . BREAST RECONSTRUCTION Bilateral 10/12/2015   Procedure: BREAST oncoplastic  RECONSTRUCTION;  Surgeon: Irene Limbo, MD;  Location: [facility];  Service: Plastics;  Laterality: Bilateral;  . BREAST REDUCTION SURGERY Bilateral 10/12/2015   Procedure: MAMMARY REDUCTION  (BREAST);  Surgeon: Irene Limbo, MD;  Location: [place];  Service: Plastics;  Laterality: Bilateral;  . DILATION AND CURETTAGE OF UTERUS  2004   s/p  . EXCISION OF BREAST LESION Left 10/12/2015   Procedure: Re-EXCISION OF BREAST;  Surgeon: Coralie Keens, MD;  Location: [place];  Service: General;  Laterality: Left;  . REDUCTION MAMMAPLASTY Bilateral 09/2015  . THYROIDECTOMY  02/2009   Dr Harlow Asa  . TONSILLECTOMY  1982     OB History    Gravida  0   Para  0   Term  0   Preterm  0   AB  0   Living  0     SAB  0   TAB  0   Ectopic  0   Multiple  0   Live Births  0           Family History  Problem Relation Age of Onset  . Dementia Father        deceased age 48 secondary to dementia  . Bipolar disorder Father   . Emphysema Father   . Heart disease Father   . COPD Father        smoker  . Hypertension Father   . Depression Father   . Alcoholism Father   . CAD Mother 49       Died age 36 of CAD  . Heart disease Mother   . Graves' disease Mother   . Hypertension Mother   . Thyroid disease Mother   . CAD Maternal Grandfather 60  . Heart disease Maternal Grandfather   . Other Sister 47       hysterectomy for fibroids  . Prostate cancer Brother        low grade; w/ surveillance  . Thyroid nodules Brother 55  . Fibroids Other        niece dx approx 66  . Hypertension Other   . Kidney cancer Cousin        maternal 1st cousin dx 35-47; former smoker  . Lung cancer Other        maternal great aunt (MGM's sister); not a smoker  . Cancer Other        nephew dx neuroblastoma at 58.39 years old  . Colon cancer Neg Hx   . Colon polyps Neg Hx     Social History   Tobacco Use  . Smoking status: Former Smoker     Packs/day: 0.50    Types: Cigarettes    Quit date: 09/29/2007    Years since quitting: 11.6  . Smokeless tobacco: Never Used  . Tobacco comment: Smoked on and off for 20 years. Quit about 8 years ago (as of 08/2015)  Substance Use Topics  . Alcohol  use: Yes    Alcohol/week: 0.0 standard drinks    Comment: occasionally  . Drug use: No    Home Medications Prior to Admission medications   Medication Sig Start Date End Date Taking? Authorizing Provider  Blood Glucose Monitoring Suppl (FREESTYLE LITE) DEVI 1 each by Does not apply route 2 (two) times daily. [name] 02/03/19   Renato Shin, MD  dapagliflozin propanediol (FARXIGA) 10 MG TABS tablet Take 10 mg by mouth daily. 07/03/18   Renato Shin, MD  gabapentin (NEURONTIN) 300 MG capsule Take 2 capsules (600 mg total) by mouth at bedtime. 08/28/18   Magrinat, Virgie Dad, MD  glucose blood (FREESTYLE LITE) test strip 1 each by Other route 2 (two) times daily. [name] 02/03/19   Renato Shin, MD  Insulin Glargine [facility]) 100 UNIT/ML Inject 0.5 mLs (50 Units total) into the skin every morning. 04/14/19   Renato Shin, MD  Lancets (FREESTYLE) lancets 1 each by Other route 2 (two) times daily. [name] 02/03/19   Renato Shin, MD  levonorgestrel (MIRENA) 20 MCG/24HR IUD 1 each by Intrauterine route once. 08/23/12   [provider]  levothyroxine (SYNTHROID) 137 MCG tablet Take 1 tablet (137 mcg total) by mouth daily before breakfast. 04/15/19   Renato Shin, MD  losartan (COZAAR) 50 MG tablet Take 1 tablet (50 mg total) by mouth daily. 01/28/19   Jearld Fenton, NP  metFORMIN (GLUCOPHAGE) 1000 MG tablet TAKE 1 TABLET BY MOUTH 2 TIMES DAILY WITH A MEAL. 04/07/19   Renato Shin, MD  pantoprazole (PROTONIX) 40 MG tablet TAKE 1 TABLET (40 MG TOTAL) BY MOUTH DAILY. MUST SCHEDULE PHYSICAL EXAM 12/02/18   Jearld Fenton, NP  pregabalin (LYRICA) 75 MG capsule Take 1 capsule (75 mg total) by mouth 2 (two) times daily. 01/22/19   Hyatt, Max T, DPM    rosuvastatin (CRESTOR) 20 MG tablet TAKE 1 TABLET BY MOUTH DAILY. 03/03/19   Jearld Fenton, NP  tamoxifen (NOLVADEX) 20 MG tablet Take 1 tablet (20 mg total) by mouth daily. 12/16/18   Magrinat, Virgie Dad, MD  Vitamin D, Ergocalciferol, (DRISDOL) 1.25 MG (50000 UT) CAPS capsule Take 1 capsule (50,000 Units total) by mouth every 7 (seven) days. 12/03/18   Jearld Fenton, NP  Calcium Carbonate 1500 MG TABS Take 1,500 mg by mouth daily.    04/04/11  [provider]    Allergies    Penicillins, Clindamycin/lincomycin, and Ace inhibitors  Review of Systems   Review of Systems  Constitutional: Negative for appetite change.  Respiratory: Negative for shortness of breath.   Cardiovascular: Positive for chest pain.  Gastrointestinal: Negative for abdominal pain.  Musculoskeletal: Negative for back pain.  Neurological: Positive for light-headedness.  Psychiatric/Behavioral: Negative for confusion.    Physical Exam Updated Vital Signs BP (!) 146/80 (BP Location: Right Arm)   Pulse 88   Temp 98.3 F (36.8 C) (Oral)   Resp 16   SpO2 98%   Physical Exam Vitals and nursing note reviewed.  Constitutional:      Appearance: She is obese.  HENT:     Head: Normocephalic.  Eyes:     Pupils: Pupils are equal, round, and reactive to light.  Cardiovascular:     Rate and Rhythm: Rhythm irregular.  Pulmonary:     Breath sounds: No wheezing or rhonchi.  Abdominal:     Tenderness: There is no abdominal tenderness.  Musculoskeletal:     Cervical back: Neck supple.     Right lower leg: No edema.  Left lower leg: No edema.  Skin:    General: Skin is warm.     Capillary Refill: Capillary refill takes less than 2 seconds.  Neurological:     Mental Status: She is alert and oriented to person, place, and time.     ED Results / Procedures / Treatments   Labs (all labs ordered are listed, but only abnormal results are displayed) Labs Reviewed  BASIC METABOLIC PANEL - Abnormal;  Notable for the following components:      Result Value   Glucose, Bld 200 (*)    All other components within normal limits  CBC - Abnormal; Notable for the following components:   RBC 5.21 (*)    All other components within normal limits  I-STAT BETA HCG BLOOD, ED ([facility], [facility], [facility] ONLY)  TROPONIN I (HIGH SENSITIVITY)  TROPONIN I (HIGH SENSITIVITY)    EKG EKG Interpretation  Date/Time:  Monday May 19 2019 09:35:49 EDT Ventricular Rate:  95 PR Interval:  144 QRS Duration: 80 QT Interval:  370 QTC Calculation: 464 R Axis:   79 Text Interpretation: Normal sinus rhythm Normal ECG Confirmed by Pattricia Boss [phone]) on 05/19/2019 12:42:34 PM Also confirmed by Davonna Belling [phone])  on 05/19/2019 2:18:22 PM   Radiology DG Chest 2 View  Result Date: 05/19/2019 CLINICAL DATA:  Chest pain, dizziness, shortness of breath EXAM: CHEST - 2 VIEW COMPARISON:  10/04/2016 FINDINGS: The heart size and mediastinal contours are within normal limits. Both lungs are clear. The visualized skeletal structures are unremarkable. IMPRESSION: Negative. Electronically Signed   By: Rolm Baptise M.D.   On: 05/19/2019 10:54    Procedures Procedures (including critical care time)  Medications Ordered in ED Medications  sodium chloride flush (NS) 0.9 % injection 3 mL (has no administration in time range)    ED Course  I have reviewed the triage vital signs and the nursing notes.  Pertinent labs & imaging results that were available during my care of the patient were reviewed by me and considered in my medical decision making (see chart for details).    MDM Rules/Calculators/A&P HEAR Score: 3                    Patient presents with palpitations.  States she will feel her heart race and then stop.  First troponin reassuring.  Will get second troponin.  EKG normal but was not having palpitations at the time.  Heart score of 3.  Ischemic disease felt less likely.  If monitoring shows no severe arrhythmia  and second troponin negative will discharge home with cardiology follow-up care turned over to Dr. [name] Nine. Final Clinical Impression(s) / ED Diagnoses Final diagnoses:  Palpitations    Rx / DC Orders ED Discharge Orders    None       Davonna Belling, MD 05/19/19 [phone]

## 2019-05-22 ENCOUNTER — Ambulatory Visit: Payer: No Typology Code available for payment source | Admitting: Internal Medicine

## 2019-05-22 ENCOUNTER — Ambulatory Visit (INDEPENDENT_AMBULATORY_CARE_PROVIDER_SITE_OTHER): Payer: No Typology Code available for payment source | Admitting: Internal Medicine

## 2019-05-22 ENCOUNTER — Other Ambulatory Visit: Payer: Self-pay

## 2019-05-22 ENCOUNTER — Encounter: Payer: Self-pay | Admitting: Internal Medicine

## 2019-05-22 VITALS — BP 144/86 | HR 94 | Temp 97.6°F | Wt 230.0 lb

## 2019-05-22 DIAGNOSIS — R002 Palpitations: Secondary | ICD-10-CM

## 2019-05-22 DIAGNOSIS — E890 Postprocedural hypothyroidism: Secondary | ICD-10-CM

## 2019-05-22 DIAGNOSIS — R234 Changes in skin texture: Secondary | ICD-10-CM | POA: Diagnosis not present

## 2019-05-22 DIAGNOSIS — E7800 Pure hypercholesterolemia, unspecified: Secondary | ICD-10-CM

## 2019-05-22 DIAGNOSIS — I10 Essential (primary) hypertension: Secondary | ICD-10-CM

## 2019-05-22 DIAGNOSIS — E119 Type 2 diabetes mellitus without complications: Secondary | ICD-10-CM

## 2019-05-22 DIAGNOSIS — R079 Chest pain, unspecified: Secondary | ICD-10-CM | POA: Diagnosis not present

## 2019-05-22 LAB — T4, FREE: Free T4: 1.26 ng/dL (ref 0.60–1.60)

## 2019-05-22 LAB — TSH: TSH: 0.6 u[IU]/mL (ref 0.35–4.50)

## 2019-05-22 NOTE — Progress Notes (Signed)
Subjective:    Patient ID: Stacey Roberts, female    DOB: 28-Oct-1965, 54 y.o.   MRN: [id number]  HPI  Pt presents to the clinic today for ER follow up. She went to the ER 4/26 with c/o chest pain and palpitations. Labs including troponin were negative. Chest xray was normal. ECG was normal, unchanged from prior. There was no intervention. She was discharged and advised to follow up with PCP for referral to cardiology. She reports history of normal stress test in the past. Since discharge, she reports intermittent heart racing and palpitations, no more chest pain. She has a family history of heart disease.  She drinks 1 cup of coffee per day. She feels like she has a normal amount of stress, but does not feel overwhelmed. She had a reduction in Levothyroxine 03/2019 due to low TSH, but has not had this repeated.  She also reports redness to her left breast. She noticed this 1 month ago. The redness has been unchanged. She has a history of left breast cancer s/p excision and radiation. She has massive scarring to the left breast. Mammogram from 07/2018 reviewed.  Review of Systems      Past Medical History:  Diagnosis Date  . Allergy    seasonal  . BRCA negative 2017  . Breast cancer ([place]) 2014  . Breast cancer ([place]) 2017  . Bronchitis    hx of  . Diabetes mellitus, type II ([place])   . Endometrial hyperplasia 2014   Mirena placed; Dr. Carren Rang  . GERD (gastroesophageal reflux disease)   . Headache   . History of radiation therapy 12/01/15-01/11/16   Left breast DIBH / 50.4 Gy in 28 fractions and Left breast boost / 12 Gy in 6 fractions  . Hyperlipidemia   . Hypertension   . Hypothyroidism   . Joint pain   . Obesity   . PCOS (polycystic ovarian syndrome)   . Personal history of radiation therapy 2017  . Pneumonia    as a child  . Sleep apnea 2008   severe OSA-does not use a cpap  . Snoring   . 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 Buddy Duty    Current Outpatient Medications  Medication Sig Dispense Refill  . Blood Glucose Monitoring Suppl (FREESTYLE LITE) DEVI 1 each by Does not apply route 2 (two) times daily. [name] 1 each 0  . dapagliflozin propanediol (FARXIGA) 10 MG TABS tablet Take 10 mg by mouth daily. 90 tablet 3  . gabapentin (NEURONTIN) 300 MG capsule Take 2 capsules (600 mg total) by mouth at bedtime. 180 capsule 4  . glucose blood (FREESTYLE LITE) test strip 1 each by Other route 2 (two) times daily. [name] 200 each 0  . Insulin Glargine (BASAGLAR KWIKPEN) 100 UNIT/ML Inject 0.5 mLs (50 Units total) into the skin every morning. 25 pen 3  . Lancets (FREESTYLE) lancets 1 each by Other route 2 (two) times daily. [name] 200 each 0  . levonorgestrel (MIRENA) 20 MCG/24HR IUD 1 each by Intrauterine route once.    Marland Kitchen levothyroxine (SYNTHROID) 137 MCG tablet Take 1 tablet (137 mcg total) by mouth daily before breakfast. 90 tablet 3  . losartan (COZAAR) 50 MG tablet Take 1 tablet (50 mg total) by mouth daily. 90 tablet 1  . metFORMIN (GLUCOPHAGE) 1000 MG tablet TAKE 1 TABLET BY MOUTH 2 TIMES DAILY WITH A MEAL. 180 tablet 0  .  pantoprazole (PROTONIX) 40 MG tablet TAKE 1 TABLET (40 MG TOTAL) BY MOUTH DAILY. MUST SCHEDULE PHYSICAL EXAM 90 tablet 3  . pregabalin (LYRICA) 75 MG capsule Take 1 capsule (75 mg total) by mouth 2 (two) times daily. 180 capsule 3  . rosuvastatin (CRESTOR) 20 MG tablet TAKE 1 TABLET BY MOUTH DAILY. 90 tablet 0  . tamoxifen (NOLVADEX) 20 MG tablet Take 1 tablet (20 mg total) by mouth daily. 90 tablet 3  . Vitamin D, Ergocalciferol, (DRISDOL) 1.25 MG (50000 UT) CAPS capsule Take 1 capsule (50,000 Units total) by mouth every 7 (seven) days. 12 capsule 0   No current facility-administered medications for this visit.    Allergies  Allergen Reactions  . Penicillins Shortness Of Breath, Rash and Other (See Comments)    Has patient had a PCN reaction  causing immediate rash, facial/tongue/throat swelling, SOB or lightheadedness with hypotension: Yes Has patient had a PCN reaction causing severe rash involving mucus membranes or skin necrosis: Yes Has patient had a PCN reaction that required hospitalization No Has patient had a PCN reaction occurring within the last 10 years: Yes If all of the above answers are "NO", then may proceed with Cephalosporin use.   . Clindamycin/Lincomycin Other (See Comments)    Severe stomach cramps  . Ace Inhibitors Other (See Comments) and Cough    REACTION: cough; denies airway involvement     Family History  Problem Relation Age of Onset  . Dementia Father        deceased age 16 secondary to dementia  . Bipolar disorder Father   . Emphysema Father   . Heart disease Father   . COPD Father        smoker  . Hypertension Father   . Depression Father   . Alcoholism Father   . CAD Mother 71       Died age 51 of CAD  . Heart disease Mother   . Graves' disease Mother   . Hypertension Mother   . Thyroid disease Mother   . CAD Maternal Grandfather 60  . Heart disease Maternal Grandfather   . Other Sister 7       hysterectomy for fibroids  . Prostate cancer Brother        low grade; w/ surveillance  . Thyroid nodules Brother 75  . Fibroids Other        niece dx approx 11  . Hypertension Other   . Kidney cancer Cousin        maternal 1st cousin dx 50-47; former smoker  . Lung cancer Other        maternal great aunt (MGM's sister); not a smoker  . Cancer Other        nephew dx neuroblastoma at 83.59 years old  . Colon cancer Neg Hx   . Colon polyps Neg Hx     Social History   Socioeconomic History  . Marital status: Single    Spouse name: Not on file  . Number of children: Not on file  . Years of education: Not on file  . Highest education level: Not on file  Occupational History    Employer: [employer]    Comment: Outpatient scheduling in radiology  Tobacco Use  . Smoking status:  Former Smoker    Packs/day: 0.50    Types: Cigarettes    Quit date: 09/29/2007    Years since quitting: 11.6  . Smokeless tobacco: Never Used  . Tobacco comment: Smoked on and off for 20  years. Quit about 8 years ago (as of 08/2015)  Substance and Sexual Activity  . Alcohol use: Yes    Alcohol/week: 0.0 standard drinks    Comment: occasionally  . Drug use: No  . Sexual activity: Not Currently    Birth control/protection: I.U.D.  Other Topics Concern  . Not on file  Social History Narrative   The patient is divorced, moved to [place] in   2006 from [place].  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    No alcohol.  Tobacco use:  She quit 5 years ago.  She smoked on   and off for approximately 20 years.  No history of recreational drug   Use.Drinks two cups of coffee per work day.    Social Determinants of Health   Financial Resource Strain:   . Difficulty of Paying Living Expenses:   Food Insecurity:   . Worried About Charity fundraiser in the Last Year:   . Arboriculturist in the Last Year:   Transportation Needs:   . Film/video editor (Medical):   Marland Kitchen Lack of Transportation (Non-Medical):   Physical Activity:   . Days of Exercise per Week:   . Minutes of Exercise per Session:   Stress:   . Feeling of Stress :   Social Connections:   . Frequency of Communication with Friends and Family:   . Frequency of Social Gatherings with Friends and Family:   . Attends Religious Services:   . Active Member of Clubs or Organizations:   . Attends Archivist Meetings:   Marland Kitchen Marital Status:   Intimate Partner Violence:   . Fear of Current or Ex-Partner:   . Emotionally Abused:   Marland Kitchen Physically Abused:   . Sexually Abused:      Constitutional: Denies fever, malaise, fatigue, headache or abrupt weight changes.  Respiratory: Denies difficulty breathing, shortness of breath, cough  or sputum production.   Cardiovascular: Pt reports palpitations, chest pain. Denies chest tightness, or swelling in the hands or feet.  Gastrointestinal: Denies abdominal pain, bloating, constipation, diarrhea or blood in the stool.  Skin: Pt reports redness of left breast. Denies rashes, lesions or ulcercations.    No other specific complaints in a complete review of systems (except as listed in HPI above).  Objective:   Physical Exam  BP (!) 144/86   Pulse 94   Temp 97.6 F (36.4 C) (Temporal)   Wt 230 lb (104.3 kg)   SpO2 98%   BMI 39.48 kg/m   Wt Readings from Last 3 Encounters:  04/14/19 234 lb 9.6 oz (106.4 kg)  03/11/19 241 lb (109.3 kg)  02/11/19 243 lb 3.2 oz (110.3 kg)    General: Appears her stated age, obese, in NAD. Skin: 1 cm area of redness noted about 1 oclock left breast. Cardiovascular: Normal rate and rhythm. S1,S2 noted.  No murmur, rubs or gallops noted. No JVD or BLE edema. No carotid bruits noted. Pulmonary/Chest: Normal effort and positive vesicular breath sounds. No respiratory distress. No wheezes, rales or ronchi noted.  Neurological: Alert and oriented. Psychiatric: Tearful.   BMET    Component Value Date/Time   NA 140 05/19/2019 0945   NA 138 03/21/2017 1018   NA 137 02/18/2013 1554   K 4.3 05/19/2019 0945   K 4.4 02/18/2013 1554   CL 103 05/19/2019 0945   CO2 25 05/19/2019  0945   CO2 25 02/18/2013 1554   GLUCOSE 200 (H) 05/19/2019 0945   GLUCOSE 300 (H) 02/18/2013 1554   BUN 13 05/19/2019 0945   BUN 16 03/21/2017 1018   BUN 14.2 02/18/2013 1554   CREATININE 0.69 05/19/2019 0945   CREATININE 0.67 07/16/2017 1442   CREATININE 0.8 02/18/2013 1554   CALCIUM 9.0 05/19/2019 0945   CALCIUM 9.2 02/18/2013 1554   GFRNONAA >60 05/19/2019 0945   GFRNONAA >60 07/16/2017 1442   GFRAA >60 05/19/2019 0945   GFRAA >60 07/16/2017 1442    Lipid Panel     Component Value Date/Time   CHOL 104 03/21/2019 0751   CHOL 122 03/21/2017 1018   TRIG  172.0 (H) 03/21/2019 0751   HDL 35.50 (L) 03/21/2019 0751   HDL 41 03/21/2017 1018   CHOLHDL 3 03/21/2019 0751   VLDL 34.4 03/21/2019 0751   LDLCALC 34 03/21/2019 0751   LDLCALC 63 03/21/2017 1018    CBC    Component Value Date/Time   WBC 9.7 05/19/2019 0945   RBC 5.21 (H) 05/19/2019 0945   HGB 14.0 05/19/2019 0945   HGB 13.0 07/16/2017 1442   HGB 13.5 03/21/2017 1018   HGB 12.5 02/18/2013 1554   HCT 45.8 05/19/2019 0945   HCT 42.0 03/21/2017 1018   HCT 39.6 02/18/2013 1554   PLT 255 05/19/2019 0945   PLT 274 07/16/2017 1442   PLT 306 02/18/2013 1554   MCV 87.9 05/19/2019 0945   MCV 86 03/21/2017 1018   MCV 82.6 02/18/2013 1554   MCH 26.9 05/19/2019 0945   MCHC 30.6 05/19/2019 0945   RDW 14.0 05/19/2019 0945   RDW 14.6 03/21/2017 1018   RDW 16.4 (H) 02/18/2013 1554   LYMPHSABS 1.5 08/28/2018 0800   LYMPHSABS 1.5 03/21/2017 1018   LYMPHSABS 2.0 02/18/2013 1554   MONOABS 0.4 08/28/2018 0800   MONOABS 0.6 02/18/2013 1554   EOSABS 0.1 08/28/2018 0800   EOSABS 0.1 03/21/2017 1018   BASOSABS 0.0 08/28/2018 0800   BASOSABS 0.0 03/21/2017 1018   BASOSABS 0.0 02/18/2013 1554    Hgb A1C Lab Results  Component Value Date   HGBA1C 7.9 (A) 04/14/2019            Assessment & Plan:   ER Follow Up for Chest Pain and Palpitations, Postoperative Hypothyrodism, DM2, HTN, HLD:  ER notes, labs and imaging reviewed Will repeat TSH and Free T4 today, will adjust Levothyroxine if needed based on labs. Referral to cardiology placed for insurance purpose, already has an appt scheduled tomorrow with Dr. Saunders Revel. Discussed the importance of good blood pressure, blood sugar, cholesterol and weight control. Advised to increase Losartan to 100 mg- she would like to hold off and get cardiology input first Continue Rosuvastatin, consume a low fat diet A1C 7.9, improving. Continue Basaglar and Metformin- encouraged low carb diet and exercise for weight loss She will likely need Holter  monitor, normal echo and stress test in the past per pt  Redness of Left Breast, Hx of Breast Cancer:  Diagnostic mammogram and ultrasound of the left breast ordered  Will follow up after lab, follow up with Dr. Saunders Revel as planned    [name], NP This visit occurred during the SARS-CoV-2 public health emergency.  Safety protocols were in place, including screening questions prior to the visit, additional usage of staff PPE, and extensive cleaning of exam room while observing appropriate contact time as indicated for disinfecting solutions.

## 2019-05-22 NOTE — Patient Instructions (Signed)
Palpitations Palpitations are feelings that your heartbeat is not normal. Your heartbeat may feel like it is:  Uneven.  Faster than normal.  Fluttering.  Skipping a beat. This is usually not a serious problem. In some cases, you may need tests to rule out any serious problems. Follow these instructions at home: Pay attention to any changes in your condition. Take these actions to help manage your symptoms: Eating and drinking  Avoid: ? Coffee, tea, soft drinks, and energy drinks. ? Chocolate. ? Alcohol. ? Diet pills. Lifestyle   Try to lower your stress. These things can help you relax: ? Yoga. ? Deep breathing and meditation. ? Exercise. ? Using words and images to create positive thoughts (guided imagery). ? Using your mind to control things in your body (biofeedback).  Do not use drugs.  Get plenty of rest and sleep. Keep a regular bed time. General instructions   Take over-the-counter and prescription medicines only as told by your doctor.  Do not use any products that contain nicotine or tobacco, such as cigarettes and e-cigarettes. If you need help quitting, ask your doctor.  Keep all follow-up visits as told by your doctor. This is important. You may need more tests if palpitations do not go away or get worse. Contact a doctor if:  Your symptoms last more than 24 hours.  Your symptoms occur more often. Get help right away if you:  Have chest pain.  Feel short of breath.  Have a very bad headache.  Feel dizzy.  Pass out (faint). Summary  Palpitations are feelings that your heartbeat is uneven or faster than normal. It may feel like your heart is fluttering or skipping a beat.  Avoid food and drinks that may cause palpitations. These include caffeine, chocolate, and alcohol.  Try to lower your stress. Do not smoke or use drugs.  Get help right away if you faint or have chest pain, shortness of breath, a severe headache, or dizziness. This  information is not intended to replace advice given to you by your health care provider. Make sure you discuss any questions you have with your health care provider. Document Revised: 02/21/2017 Document Reviewed: 02/21/2017 Elsevier Patient Education  2020 Elsevier Inc.  

## 2019-05-23 ENCOUNTER — Other Ambulatory Visit
Admission: RE | Admit: 2019-05-23 | Discharge: 2019-05-23 | Disposition: A | Discharge status: Home / Self Care | Payer: No Typology Code available for payment source | Source: Ambulatory Visit | Attending: Internal Medicine | Admitting: Internal Medicine

## 2019-05-23 ENCOUNTER — Encounter: Payer: Self-pay | Admitting: Internal Medicine

## 2019-05-23 ENCOUNTER — Ambulatory Visit (INDEPENDENT_AMBULATORY_CARE_PROVIDER_SITE_OTHER): Payer: No Typology Code available for payment source | Admitting: Internal Medicine

## 2019-05-23 VITALS — BP 140/92 | HR 99 | Ht 64.0 in | Wt 230.4 lb

## 2019-05-23 DIAGNOSIS — R072 Precordial pain: Secondary | ICD-10-CM | POA: Insufficient documentation

## 2019-05-23 DIAGNOSIS — R002 Palpitations: Secondary | ICD-10-CM

## 2019-05-23 DIAGNOSIS — R079 Chest pain, unspecified: Secondary | ICD-10-CM

## 2019-05-23 DIAGNOSIS — I10 Essential (primary) hypertension: Secondary | ICD-10-CM

## 2019-05-23 LAB — FIBRIN DERIVATIVES D-DIMER (ARMC ONLY): Fibrin derivatives D-dimer (ARMC): 235.26 ng/mL (FEU) (ref 0.00–499.00)

## 2019-05-23 MED ORDER — ASPIRIN EC 81 MG PO TBEC: 81.0000 mg | DELAYED_RELEASE_TABLET | Freq: Every day | ORAL | 3 refills | Status: AC

## 2019-05-23 MED ORDER — METOPROLOL TARTRATE 25 MG PO TABS
25.0000 mg | ORAL_TABLET | Freq: Two times a day (BID) | ORAL | 3 refills | Status: DC
Start: 2019-05-23 — End: 2019-06-27

## 2019-05-23 NOTE — Patient Instructions (Addendum)
Medication Instructions:  Your physician has recommended you make the following change in your medication:  1) START taking aspirin 81 mg daily 2) START taking metoprolol tartrate 25 mg twice per day   *If you need a refill on your cardiac medications before your next appointment, please call your pharmacy*   Lab Work: TODAY: D-dimer  If you have labs (blood work) drawn today and your tests are completely normal, you will receive your results only by: Marland Kitchen MyChart Message (if you have MyChart) OR . A paper copy in the mail If you have any lab test that is abnormal or we need to change your treatment, we will call you to review the results.   Testing/Procedures: Your physician has requested that you have an echocardiogram. Echocardiography is a painless test that uses sound waves to create images of your heart. It provides your doctor with information about the size and shape of your heart and how well your heart's chambers and valves are working. This procedure takes approximately one hour. There are no restrictions for this procedure.  Follow-Up: At [facility], you and your health needs are our priority.  As part of our continuing mission to provide you with exceptional heart care, we have created designated Provider Care Teams.  These Care Teams include your primary Cardiologist (physician) and Advanced Practice Providers (APPs -  Physician Assistants and Nurse Practitioners) who all work together to provide you with the care you need, when you need it.  We recommend signing up for the patient portal called "MyChart".  Sign up information is provided on this After Visit Summary.  MyChart is used to connect with patients for Virtual Visits (Telemedicine).  Patients are able to view lab/test results, encounter notes, upcoming appointments, etc.  Non-urgent messages can be sent to your provider as well.   To learn more about what you can do with MyChart, go to [email or web address].     Your next appointment:   4 week(s)  The format for your next appointment:   In Person  Provider:    You may see Nelva Bush, MD or one of the following Advanced Practice Providers on your designated Care Team:    Murray Hodgkins, NP  Christell Faith, PA-C  Marrianne Mood, PA-C    Other Instructions Your cardiac CT will be scheduled at one of the below locations:   [facility] [address] [place], [place] [address] [phone]  [place] [address] [place], [place] [address] [phone]  If scheduled at [facility], please arrive at the [facility] main entrance of [facility] 30 minutes prior to test start time. Proceed to the [facility] Radiology Department (first floor) to check-in and test prep.  If scheduled at [facility], please arrive 15 mins early for check-in and test prep.  Please follow these instructions carefully (unless otherwise directed):  Hold all erectile dysfunction medications at least 3 days (72 hrs) prior to test.  On the Night Before the Test: . Be sure to Drink plenty of water. . Do not consume any caffeinated/decaffeinated beverages or chocolate 12 hours prior to your test. . Do not take any antihistamines 12 hours prior to your test. . If you take Metformin do not take 24 hours prior to test.  On the Day of the Test: . Drink plenty of water. Do not drink any water within one hour of the test. . Do not eat  any food 4 hours prior to the test. . You may take your regular medications prior to the test.  . Take metoprolol (Lopressor) 100 mg two hours prior to test. . FEMALES- please wear underwire-free bra if available      After the Test: . Drink plenty of water. . After receiving IV contrast, you may experience a mild flushed feeling. This is normal. . On occasion, you may experience a mild rash up to 24 hours after  the test. This is not dangerous. If this occurs, you can take Benadryl 25 mg and increase your fluid intake. . If you experience trouble breathing, this can be serious. If it is severe call 911 IMMEDIATELY. If it is mild, please call our office. . If you take any of these medications: Glipizide/Metformin, Avandament, Glucavance, please do not take 48 hours after completing test unless otherwise instructed.   Once we have confirmed authorization from your insurance company, we will call you to set up a date and time for your test.   For non-scheduling related questions, please contact the cardiac imaging nurse navigator should you have any questions/concerns: Marchia Bond, RN Navigator Cardiac Imaging [facility] [phone] office  For scheduling needs, including cancellations and rescheduling, please call [phone].

## 2019-05-23 NOTE — Progress Notes (Signed)
New Outpatient Visit Date: 05/23/2019  Referring Provider: Jearld Fenton, NP [address] [place],  [place] [address]  Chief Complaint: Chest pain  HPI:  Stacey Roberts is a 54 y.o. female who is being seen today for the evaluation of chest pain and palpitations at the request of [name]. She has a history of type 2 diabetes mellitus, hypertension, hyperlipidemia, and breast cancer.  She presented to the [facility] ED earlier this week with chest pain and palpitations. ED workup was unrevealing.  She was seen in 2014 by Dr. Percival Spanish for evaluation of palpitations.  Atrial bigeminy was noted at that time.  Echo at that time was normal other than mild LVH.  Today, Stacey Roberts has multiple complaints.  She initially presented to the emergency department earlier this week after 4 days of intermittent palpitations that she describes as a fluttering in her chest with associated lightheadedness.  She also noted occasional "squeezing" chest discomfort associated with the palpitations.  She reports having had similar palpitations about 10 years ago, though they were not this bad.  She has continued to have occasional albeit less severe palpitations since her ED evaluation.  Today, while driving to the office, she began having stabbing pains in her left upper back adjacent to her shoulder blade.  Pain would last a second or 2 and was without associated symptoms.  It is not positional or related to deep inspiration.  Stacey Roberts is very concerned that her symptoms could reflect underlying CAD, as she has a very strong family history of premature coronary artery disease.  She also notes having travel to [place] a few weeks ago by car.  She denies lower extremity edema as well as history of DVT/PE.  Stacey Roberts reports having undergone a stress test ordered by Dr. Rockey Situ many years ago, though I do not see records of this in the  chart.  --------------------------------------------------------------------------------------------------  Cardiovascular History & Procedures: Cardiovascular Problems:  Chest pain  Palpitations  Risk Factors:  Hypertension, diabetes mellitus, family history, and obesity.  Cath/PCI:  None  CV Surgery:  None  EP Procedures and Devices:  None  Non-Invasive Evaluation(s):  TTE (11/28/2012): Normal LV size with mild LVH.  LVEF 60% with normal wall motion.  Normal RV size and function.  No significant valvular abnormality.  Recent CV Pertinent Labs: Lab Results  Component Value Date   CHOL 104 03/21/2019   CHOL 122 03/21/2017   HDL 35.50 (L) 03/21/2019   HDL 41 03/21/2017   LDLCALC 34 03/21/2019   LDLCALC 63 03/21/2017   LDLDIRECT 153.0 09/01/2014   TRIG 172.0 (H) 03/21/2019   CHOLHDL 3 03/21/2019   INR 0.87 03/15/2009   K 4.3 05/19/2019   K 4.4 02/18/2013   BUN 13 05/19/2019   BUN 16 03/21/2017   BUN 14.2 02/18/2013   CREATININE 0.69 05/19/2019   CREATININE 0.67 07/16/2017   CREATININE 0.8 02/18/2013    --------------------------------------------------------------------------------------------------  Past Medical History:  Diagnosis Date  . Allergy    seasonal  . BRCA negative 2017  . Breast cancer ([place]) 2014  . Breast cancer ([place]) 2017  . Bronchitis    hx of  . Diabetes mellitus, type II ([place])   . Endometrial hyperplasia 2014   Mirena placed; Dr. Carren Rang  . GERD (gastroesophageal reflux disease)   . Headache   . History of radiation therapy 12/01/15-01/11/16   Left breast DIBH / 50.4 Gy in 28 fractions and Left breast boost / 12 Gy in  6 fractions  . Hyperlipidemia   . Hypertension   . Hypothyroidism   . Joint pain   . Obesity   . PCOS (polycystic ovarian syndrome)   . Personal history of radiation therapy 2017  . Pneumonia    as a child  . Sleep apnea 2008   severe OSA-does not use a cpap  . Snoring   . 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 Buddy Duty    Past Surgical History:  Procedure Laterality Date  . BREAST BIOPSY  2000   s/p  . BREAST BIOPSY Left 01/22/2013   Procedure: RE-EXCISION LEFT BREAST DCIS;  Surgeon: Harl Bowie, MD;  Location: [place];  Service: General;  Laterality: Left;  . BREAST LUMPECTOMY Left 01/06/2013   Procedure: LUMPECTOMY;  Surgeon: Harl Bowie, MD;  Location: [place];  Service: General;  Laterality: Left;  . BREAST LUMPECTOMY Left 2017  . BREAST LUMPECTOMY WITH NEEDLE LOCALIZATION AND AXILLARY SENTINEL LYMPH NODE BX Left 09/30/2015   Procedure: Left BREAST LUMPECTOMY WITH NEEDLE LOCALIZATION AND AXILLARY SENTINEL LYMPH NODE BX;  Surgeon: Coralie Keens, MD;  Location: [place];  Service: General;  Laterality: Left;  . BREAST RECONSTRUCTION Bilateral 10/12/2015   Procedure: BREAST oncoplastic RECONSTRUCTION;  Surgeon: Irene Limbo, MD;  Location: [facility];  Service: Plastics;  Laterality: Bilateral;  . BREAST REDUCTION SURGERY Bilateral 10/12/2015   Procedure: MAMMARY REDUCTION  (BREAST);  Surgeon: Irene Limbo, MD;  Location: [facility];  Service: Plastics;  Laterality: Bilateral;  . DILATION AND CURETTAGE OF UTERUS  2004   s/p  . EXCISION OF BREAST LESION Left 10/12/2015   Procedure: Re-EXCISION OF BREAST;  Surgeon: Coralie Keens, MD;  Location: [facility];  Service: General;  Laterality: Left;  . REDUCTION MAMMAPLASTY Bilateral 09/2015  . THYROIDECTOMY  02/2009   Dr Harlow Asa  . TONSILLECTOMY  1982    Current Meds  Medication Sig  . Blood Glucose Monitoring Suppl (FREESTYLE LITE) DEVI 1 each by Does not apply route 2 (two) times daily. [name]  . calcium carbonate (OSCAL) 1500 (600 Ca) MG TABS tablet Take by mouth daily with breakfast.  . dapagliflozin propanediol (FARXIGA) 10 MG TABS tablet Take 10  mg by mouth daily.  Marland Kitchen gabapentin (NEURONTIN) 300 MG capsule Take 2 capsules (600 mg total) by mouth at bedtime.  Marland Kitchen glucose blood (FREESTYLE LITE) test strip 1 each by Other route 2 (two) times daily. [name]  . Insulin Glargine (BASAGLAR KWIKPEN) 100 UNIT/ML Inject 0.5 mLs (50 Units total) into the skin every morning.  . Lancets (FREESTYLE) lancets 1 each by Other route 2 (two) times daily. [name]  . levonorgestrel (MIRENA) 20 MCG/24HR IUD 1 each by Intrauterine route once.  Marland Kitchen levothyroxine (SYNTHROID) 137 MCG tablet Take 1 tablet (137 mcg total) by mouth daily before breakfast.  . losartan (COZAAR) 50 MG tablet Take 1 tablet (50 mg total) by mouth daily.  . metFORMIN (GLUCOPHAGE) 1000 MG tablet TAKE 1 TABLET BY MOUTH 2 TIMES DAILY WITH A MEAL.  . pantoprazole (PROTONIX) 40 MG tablet TAKE 1 TABLET (40 MG TOTAL) BY MOUTH DAILY. MUST SCHEDULE PHYSICAL EXAM  . pregabalin (LYRICA) 75 MG capsule Take 1 capsule (75 mg total) by mouth 2 (two) times daily.  . rosuvastatin (CRESTOR) 20 MG tablet TAKE 1 TABLET BY MOUTH DAILY.  . tamoxifen (NOLVADEX) 20 MG tablet Take 1 tablet (20 mg  total) by mouth daily.    Allergies: Penicillins, Clindamycin/lincomycin, and Ace inhibitors  Social History   Tobacco Use  . Smoking status: Former Smoker    Packs/day: 0.50    Types: Cigarettes    Quit date: 09/29/2007    Years since quitting: 11.6  . Smokeless tobacco: Never Used  . Tobacco comment: Smoked on and off for 20 years. Quit about 8 years ago (as of 08/2015)  Substance Use Topics  . Alcohol use: Not Currently    Alcohol/week: 0.0 standard drinks  . Drug use: No    Family History  Problem Relation Age of Onset  . Dementia Father        deceased age 20 secondary to dementia  . Bipolar disorder Father   . Emphysema Father   . Heart disease Father   . COPD Father        smoker  . Hypertension Father   . Depression Father   . Alcoholism Father   . CAD Mother 37       Died age 43 of CAD  . Heart  disease Mother        CABG x 2  . Graves' disease Mother   . Hypertension Mother   . Thyroid disease Mother   . Heart attack Mother 72  . CAD Maternal Grandfather 60  . Heart disease Maternal Grandfather   . Heart attack Maternal Grandfather 64  . Other Sister 46       hysterectomy for fibroids  . Prostate cancer Brother        low grade; w/ surveillance  . Thyroid nodules Brother 32  . Heart disease Brother 32       CABG x 4  . Heart disease Sister        CABG x 2  . Heart attack Sister 88  . Fibroids Other        niece dx approx 45  . Hypertension Other   . Kidney cancer Cousin        maternal 1st cousin dx 73-47; former smoker  . Lung cancer Other        maternal great aunt (MGM's sister); not a smoker  . Cancer Other        nephew dx neuroblastoma at 30.79 years old  . Colon cancer Neg Hx   . Colon polyps Neg Hx     Review of Systems: A 12-system review of systems was performed and was negative except as noted in the HPI.  --------------------------------------------------------------------------------------------------  Physical Exam: BP (!) 140/92 (BP Location: Right Arm, Patient Position: Sitting, Cuff Size: Normal)   Pulse 99   Ht '5\' 4"'  (1.626 m)   Wt 230 lb 6 oz (104.5 kg)   SpO2 98%   BMI 39.54 kg/m   General: NAD. HEENT: No conjunctival pallor or scleral icterus. Facemask in place. Neck: Supple without lymphadenopathy, thyromegaly, JVD, or HJR. No carotid bruit. Lungs: Normal work of breathing. Clear to auscultation bilaterally without wheezes or crackles. Heart: Regular rate and rhythm without murmurs, rubs, or gallops.  Unable to assess PMI due to body habitus. Abd: Bowel sounds present. Soft, NT/ND.  Unable to assess HSM due to body habitus. Ext: No lower extremity edema. Radial, PT, and DP pulses are 2+ bilaterally Skin: Warm and dry without rash. Neuro: CNIII-XII intact. Strength and fine-touch sensation intact in upper and lower extremities  bilaterally. Psych: Normal mood and affect.  EKG: Normal sinus rhythm with PACs and early R wave transition.  Lab  Results  Component Value Date   WBC 9.7 05/19/2019   HGB 14.0 05/19/2019   HCT 45.8 05/19/2019   MCV 87.9 05/19/2019   PLT 255 05/19/2019    Lab Results  Component Value Date   NA 140 05/19/2019   K 4.3 05/19/2019   CL 103 05/19/2019   CO2 25 05/19/2019   BUN 13 05/19/2019   CREATININE 0.69 05/19/2019   GLUCOSE 200 (H) 05/19/2019   ALT 16 11/18/2018    Lab Results  Component Value Date   CHOL 104 03/21/2019   HDL 35.50 (L) 03/21/2019   LDLCALC 34 03/21/2019   LDLDIRECT 153.0 09/01/2014   TRIG 172.0 (H) 03/21/2019   CHOLHDL 3 03/21/2019     --------------------------------------------------------------------------------------------------  ASSESSMENT AND PLAN: Chest pain: Ms. Bitting reports several different kinds of chest and upper back pain over the last week.  She initially had transient squeezing in her chest when she presented to the ER complaining of palpitations and lightheadedness.  Today, she has experienced brief, sharp pains in the left upper back that are nonexertional.  Given recent travel as well as an element of shortness of breath, I am concerned about the potential for pulmonary embolism.  Very transient nature of pain is less consistent with ACS and aortic dissection.  We discussed referral to the emergency department for further evaluation, which Ms. Jentz declines.  I have therefore recommended that we check a D-dimer.  If it is positive, we will need to obtain a stat CTA of the chest.  If it is negative, I recommend cardiac CTA for further evaluation.  We will begin aspirin 81 mg daily and metoprolol tartrate 25 mg twice daily.  I have advised Ms. Abplanalp to seek immediate medical attention in the emergency department if she has worsening symptoms.  Palpitations: Isolated PAC noted again on EKG today.  This is not a new finding.  She  reports more sustained fluttering in her chest over the last week with associated lightheadedness.  I recommended that we obtain an echocardiogram as well as the aforementioned cardiac CTA.  If these tests do not show significant CAD or cardiomyopathy, we will proceed with ambulatory cardiac monitoring.  In the meantime, we have agreed to add metoprolol tartrate 25 mg twice daily.  Hypertension: Blood pressure mildly elevated today.  Continue current dose of losartan.  Will add metoprolol tartrate, as above.  Follow-up: Return to clinic in 4 weeks.  Nelva Bush, MD 05/23/2019 11:12 AM

## 2019-05-24 ENCOUNTER — Encounter: Payer: Self-pay | Admitting: Internal Medicine

## 2019-05-29 ENCOUNTER — Ambulatory Visit (INDEPENDENT_AMBULATORY_CARE_PROVIDER_SITE_OTHER): Payer: No Typology Code available for payment source

## 2019-05-29 ENCOUNTER — Other Ambulatory Visit: Payer: Self-pay

## 2019-05-29 DIAGNOSIS — R079 Chest pain, unspecified: Secondary | ICD-10-CM

## 2019-05-29 MED ORDER — PERFLUTREN LIPID MICROSPHERE
1.0000 mL | INTRAVENOUS | Status: AC | PRN
  Administered 2019-05-29: 2 mL via INTRAVENOUS

## 2019-06-02 ENCOUNTER — Other Ambulatory Visit: Payer: Self-pay | Admitting: Internal Medicine

## 2019-06-10 ENCOUNTER — Telehealth (HOSPITAL_COMMUNITY): Payer: Self-pay | Admitting: *Deleted

## 2019-06-10 NOTE — Telephone Encounter (Signed)
Patient reaching out regarding upcoming cardiac imaging study; pt verbalizes understanding of appt date/time, parking situation and where to check in, pre-test NPO status and medications ordered, and verified current allergies; name and call back number provided for further questions should they arise  Merle Tai RN Navigator Cardiac Imaging [facility] [phone] office [phone] cell 

## 2019-06-12 ENCOUNTER — Ambulatory Visit (HOSPITAL_COMMUNITY)
Admission: RE | Admit: 2019-06-12 | Discharge: 2019-06-12 | Disposition: A | Discharge status: Home / Self Care | Payer: No Typology Code available for payment source | Source: Ambulatory Visit | Attending: Internal Medicine | Admitting: Internal Medicine

## 2019-06-12 ENCOUNTER — Other Ambulatory Visit: Payer: Self-pay

## 2019-06-12 DIAGNOSIS — R079 Chest pain, unspecified: Secondary | ICD-10-CM | POA: Insufficient documentation

## 2019-06-12 DIAGNOSIS — I2510 Atherosclerotic heart disease of native coronary artery without angina pectoris: Secondary | ICD-10-CM

## 2019-06-12 MED ORDER — NITROGLYCERIN 0.4 MG SL SUBL: 0.8000 mg | SUBLINGUAL_TABLET | Freq: Once | SUBLINGUAL | Status: DC

## 2019-06-12 MED ORDER — IOHEXOL 350 MG/ML SOLN
80.0000 mL | Freq: Once | INTRAVENOUS | Status: AC | PRN
  Administered 2019-06-12: 90 mL via INTRAVENOUS

## 2019-06-12 MED ORDER — NITROGLYCERIN 0.4 MG SL SUBL
SUBLINGUAL_TABLET | SUBLINGUAL | Status: AC
  Administered 2019-06-12: 0.8 mg
  Filled 2019-06-12: qty 2

## 2019-06-12 MED ORDER — METOPROLOL TARTRATE 5 MG/5ML IV SOLN
INTRAVENOUS | Status: AC
  Administered 2019-06-12: 5 mg
  Filled 2019-06-12: qty 5

## 2019-06-14 DIAGNOSIS — I2510 Atherosclerotic heart disease of native coronary artery without angina pectoris: Secondary | ICD-10-CM | POA: Diagnosis not present

## 2019-06-16 ENCOUNTER — Ambulatory Visit (INDEPENDENT_AMBULATORY_CARE_PROVIDER_SITE_OTHER): Payer: No Typology Code available for payment source | Admitting: Physician Assistant

## 2019-06-16 ENCOUNTER — Other Ambulatory Visit
Admission: RE | Admit: 2019-06-16 | Discharge: 2019-06-16 | Disposition: A | Discharge status: Home / Self Care | Payer: No Typology Code available for payment source | Source: Ambulatory Visit | Attending: Physician Assistant | Admitting: Physician Assistant

## 2019-06-16 ENCOUNTER — Encounter: Payer: Self-pay | Admitting: Physician Assistant

## 2019-06-16 ENCOUNTER — Telehealth: Payer: Self-pay | Admitting: *Deleted

## 2019-06-16 ENCOUNTER — Other Ambulatory Visit: Payer: Self-pay

## 2019-06-16 VITALS — BP 110/70 | HR 92 | Ht 64.0 in | Wt 234.1 lb

## 2019-06-16 DIAGNOSIS — E785 Hyperlipidemia, unspecified: Secondary | ICD-10-CM

## 2019-06-16 DIAGNOSIS — Z87891 Personal history of nicotine dependence: Secondary | ICD-10-CM

## 2019-06-16 DIAGNOSIS — E1142 Type 2 diabetes mellitus with diabetic polyneuropathy: Secondary | ICD-10-CM

## 2019-06-16 DIAGNOSIS — I10 Essential (primary) hypertension: Secondary | ICD-10-CM

## 2019-06-16 DIAGNOSIS — R002 Palpitations: Secondary | ICD-10-CM

## 2019-06-16 DIAGNOSIS — R079 Chest pain, unspecified: Secondary | ICD-10-CM | POA: Diagnosis not present

## 2019-06-16 DIAGNOSIS — Z6841 Body Mass Index (BMI) 40.0 and over, adult: Secondary | ICD-10-CM

## 2019-06-16 DIAGNOSIS — I491 Atrial premature depolarization: Secondary | ICD-10-CM

## 2019-06-16 DIAGNOSIS — Z8249 Family history of ischemic heart disease and other diseases of the circulatory system: Secondary | ICD-10-CM

## 2019-06-16 DIAGNOSIS — R931 Abnormal findings on diagnostic imaging of heart and coronary circulation: Secondary | ICD-10-CM

## 2019-06-16 LAB — CBC WITH DIFFERENTIAL/PLATELET
Abs Immature Granulocytes: 0.04 10*3/uL (ref 0.00–0.07)
Basophils Absolute: 0 10*3/uL (ref 0.0–0.1)
Basophils Relative: 0 %
Eosinophils Absolute: 0.3 10*3/uL (ref 0.0–0.5)
Eosinophils Relative: 3 %
HCT: 41.8 % (ref 36.0–46.0)
Hemoglobin: 13.3 g/dL (ref 12.0–15.0)
Immature Granulocytes: 0 %
Lymphocytes Relative: 18 %
Lymphs Abs: 2 10*3/uL (ref 0.7–4.0)
MCH: 27.3 pg (ref 26.0–34.0)
MCHC: 31.8 g/dL (ref 30.0–36.0)
MCV: 85.8 fL (ref 80.0–100.0)
Monocytes Absolute: 0.5 10*3/uL (ref 0.1–1.0)
Monocytes Relative: 5 %
Neutro Abs: 8 10*3/uL — ABNORMAL HIGH (ref 1.7–7.7)
Neutrophils Relative %: 74 %
Platelets: 263 10*3/uL (ref 150–400)
RBC: 4.87 MIL/uL (ref 3.87–5.11)
RDW: 14.6 % (ref 11.5–15.5)
WBC: 10.9 10*3/uL — ABNORMAL HIGH (ref 4.0–10.5)
nRBC: 0 % (ref 0.0–0.2)

## 2019-06-16 LAB — BASIC METABOLIC PANEL
Anion gap: 13 (ref 5–15)
BUN: 17 mg/dL (ref 6–20)
CO2: 26 mmol/L (ref 22–32)
Calcium: 9 mg/dL (ref 8.9–10.3)
Chloride: 103 mmol/L (ref 98–111)
Creatinine, Ser: 0.66 mg/dL (ref 0.44–1.00)
GFR calc Af Amer: >60 mL/min (ref >60)
GFR calc non Af Amer: >60 mL/min (ref >60)
Glucose, Bld: 229 mg/dL — ABNORMAL HIGH (ref 70–99)
Potassium: 4.5 mmol/L (ref 3.5–5.1)
Sodium: 142 mmol/L (ref 135–145)

## 2019-06-16 MED ORDER — SODIUM CHLORIDE 0.9% FLUSH: 3.0000 mL | Freq: Two times a day (BID) | INTRAVENOUS | Status: DC

## 2019-06-16 NOTE — Patient Instructions (Addendum)
Medication Instructions:  Your physician recommends that you continue on your current medications as directed. Please refer to the Current Medication list given to you today.  *If you need a refill on your cardiac medications before your next appointment, please call your pharmacy*   Lab Work: Bmet and Cbc today. Please have your labs drawn at the [facility] will need a COVID test on 06/26/19. Please report to the [facility] drive up test site between 8am-1pm  If you have labs (blood work) drawn today and your tests are completely normal, you will receive your results only by: Marland Kitchen MyChart Message (if you have MyChart) OR . A paper copy in the mail If you have any lab test that is abnormal or we need to change your treatment, we will call you to review the results.   Testing/Procedures: Your physician has requested that you have a cardiac catheterization. Cardiac catheterization is used to diagnose and/or treat various heart conditions. Doctors may recommend this procedure for a number of different reasons. The most common reason is to evaluate chest pain. Chest pain can be a symptom of coronary artery disease (CAD), and cardiac catheterization can show whether plaque is narrowing or blocking your heart's arteries. This procedure is also used to evaluate the valves, as well as measure the blood flow and oxygen levels in different parts of your heart. For further information please visit [email or web address]. Please follow instruction sheet, as given.     Follow-Up: At [facility], you and your health needs are our priority.  As part of our continuing mission to provide you with exceptional heart care, we have created designated Provider Care Teams.  These Care Teams include your primary Cardiologist (physician) and Advanced Practice Providers (APPs -  Physician Assistants and Nurse Practitioners) who all work together to provide you with the care you need, when you need it.  We recommend  signing up for the patient portal called "MyChart".  Sign up information is provided on this After Visit Summary.  MyChart is used to connect with patients for Virtual Visits (Telemedicine).  Patients are able to view lab/test results, encounter notes, upcoming appointments, etc.  Non-urgent messages can be sent to your provider as well.   To learn more about what you can do with MyChart, go to [email or web address].    Your next appointment:   4 week(s)  The format for your next appointment:   In Person  Provider:    You may see Dr. Saunders Revel or one of the following Advanced Practice Providers on your designated Care Team:    Murray Hodgkins, NP  Christell Faith, PA-C  Marrianne Mood, PA-C    Other Instructions    [address], [place] [address] Dept: [place]: [place]  06/16/2019  You are scheduled for a Cardiac Catheterization on Friday, June 4 with Dr. Harrell Gave End.  1. Please arrive at the [facility] (Main Entrance A) at [facility]: [place], [place] [address] at 7:00 AM (This time is two hours before your procedure to ensure your preparation). Free valet parking service is available.   Special note: Every effort is made to have your procedure done on time. Please understand that emergencies sometimes delay scheduled procedures.  2. Diet: Do not eat solid foods after midnight.  The patient may have clear liquids until 5am upon the day of the procedure.  3.  Labs: You will need to have a  COVID test on Thursday, June 3 at [facility] drive up Entrance Address: [address] [place], [place] [address]  Open: 8am - 1pm    4. Medication instructions in preparation for your procedure:   Contrast Allergy: No   Current Outpatient Medications (Endocrine & Metabolic):  .  dapagliflozin propanediol (FARXIGA)  10 MG TABS tablet, Take 10 mg by mouth daily. .  Insulin Glargine (BASAGLAR KWIKPEN) 100 UNIT/ML, Inject 0.5 mLs (50 Units total) into the skin every morning. Marland Kitchen  levonorgestrel (MIRENA) 20 MCG/24HR IUD, 1 each by Intrauterine route once. Marland Kitchen  levothyroxine (SYNTHROID) 137 MCG tablet, Take 1 tablet (137 mcg total) by mouth daily before breakfast. .  metFORMIN (GLUCOPHAGE) 1000 MG tablet, TAKE 1 TABLET BY MOUTH 2 TIMES DAILY WITH A MEAL.  Current Outpatient Medications (Cardiovascular):  .  losartan (COZAAR) 50 MG tablet, Take 1 tablet (50 mg total) by mouth daily. .  metoprolol tartrate (LOPRESSOR) 25 MG tablet, Take 1 tablet (25 mg total) by mouth 2 (two) times daily. .  rosuvastatin (CRESTOR) 20 MG tablet, Take 1 tablet (20 mg total) by mouth daily.   Current Outpatient Medications (Analgesics):  .  aspirin EC 81 MG tablet, Take 1 tablet (81 mg total) by mouth daily.   Current Outpatient Medications (Other):  .  Blood Glucose Monitoring Suppl (FREESTYLE LITE) DEVI, 1 each by Does not apply route 2 (two) times daily. [name] .  calcium carbonate (OSCAL) 1500 (600 Ca) MG TABS tablet, Take by mouth daily with breakfast. .  gabapentin (NEURONTIN) 300 MG capsule, Take 2 capsules (600 mg total) by mouth at bedtime. Marland Kitchen  glucose blood (FREESTYLE LITE) test strip, 1 each by Other route 2 (two) times daily. [name] .  Lancets (FREESTYLE) lancets, 1 each by Other route 2 (two) times daily. [name] .  pantoprazole (PROTONIX) 40 MG tablet, TAKE 1 TABLET (40 MG TOTAL) BY MOUTH DAILY. MUST SCHEDULE PHYSICAL EXAM .  pregabalin (LYRICA) 75 MG capsule, Take 1 capsule (75 mg total) by mouth 2 (two) times daily. .  tamoxifen (NOLVADEX) 20 MG tablet, Take 1 tablet (20 mg total) by mouth daily.      Take only 25 units of insulin the night before your procedure. Do not take any insulin on the day of the procedure.  Do not take Diabetes Med Glucophage (Metformin) on the day of the procedure and HOLD 48 HOURS AFTER  THE PROCEDURE.  On the morning of your procedure, take your Aspirin and any morning medicines NOT listed above.  You may use sips of water.  5. Plan for one night stay--bring personal belongings. 6. Bring a current list of your medications and current insurance cards. 7. You MUST have a responsible person to drive you home. 8. Someone MUST be with you the first 24 hours after you arrive home or your discharge will be delayed. 9. Please wear clothes that are easy to get on and off and wear slip-on shoes.  Thank you for allowing [place] to care for you!   -- [facility]

## 2019-06-16 NOTE — H&P (View-Only) (Signed)
Office Visit    Patient Name: Stacey Roberts Date of Encounter: 06/16/2019  Primary Care Provider:  Jearld Fenton, NP Primary Cardiologist:  Nelva Bush, MD  Chief Complaint    Chief Complaint  Patient presents with  . office visit    Pt has no complaints. Meds verbally reviewed w/ pt.     54 year old female with history of chest pain and palpitations, recent coronary artery calcium score of 1373 placing her at the 99th percentile for her age and sex, DM2, hypertension, hyperlipidemia, former tobacco use (quit 2017), and breast cancer, and who presents today for follow-up of recent cardiac CTA with FFR and to discuss cardiac catheterization.  Past Medical History    Past Medical History:  Diagnosis Date  . Allergy    seasonal  . BRCA negative 2017  . Breast cancer ([place]) 2014  . Breast cancer ([place]) 2017  . Bronchitis    hx of  . Diabetes mellitus, type II ([place])   . Endometrial hyperplasia 2014   Mirena placed; Dr. Carren Rang  . GERD (gastroesophageal reflux disease)   . Headache   . History of radiation therapy 12/01/15-01/11/16   Left breast DIBH / 50.4 Gy in 28 fractions and Left breast boost / 12 Gy in 6 fractions  . Hyperlipidemia   . Hypertension   . Hypothyroidism   . Joint pain   . Obesity   . PCOS (polycystic ovarian syndrome)   . Personal history of radiation therapy 2017  . Pneumonia    as a child  . Sleep apnea 2008   severe OSA-does not use a cpap  . Snoring   . 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 Buddy Duty   Past Surgical History:  Procedure Laterality Date  . BREAST BIOPSY  2000   s/p  . BREAST BIOPSY Left 01/22/2013   Procedure: RE-EXCISION LEFT BREAST DCIS;  Surgeon: Harl Bowie, MD;  Location: [place];  Service: General;  Laterality: Left;  . BREAST LUMPECTOMY Left 01/06/2013   Procedure: LUMPECTOMY;   Surgeon: Harl Bowie, MD;  Location: [place];  Service: General;  Laterality: Left;  . BREAST LUMPECTOMY Left 2017  . BREAST LUMPECTOMY WITH NEEDLE LOCALIZATION AND AXILLARY SENTINEL LYMPH NODE BX Left 09/30/2015   Procedure: Left BREAST LUMPECTOMY WITH NEEDLE LOCALIZATION AND AXILLARY SENTINEL LYMPH NODE BX;  Surgeon: Coralie Keens, MD;  Location: [place];  Service: General;  Laterality: Left;  . BREAST RECONSTRUCTION Bilateral 10/12/2015   Procedure: BREAST oncoplastic RECONSTRUCTION;  Surgeon: Irene Limbo, MD;  Location: [place];  Service: Plastics;  Laterality: Bilateral;  . BREAST REDUCTION SURGERY Bilateral 10/12/2015   Procedure: MAMMARY REDUCTION  (BREAST);  Surgeon: Irene Limbo, MD;  Location: [place];  Service: Plastics;  Laterality: Bilateral;  . DILATION AND CURETTAGE OF UTERUS  2004   s/p  . EXCISION OF BREAST LESION Left 10/12/2015   Procedure: Re-EXCISION OF BREAST;  Surgeon: Coralie Keens, MD;  Location: [place];  Service: General;  Laterality: Left;  . REDUCTION MAMMAPLASTY Bilateral 09/2015  . THYROIDECTOMY  02/2009   Dr Harlow Asa  . TONSILLECTOMY  1982    Allergies  Allergies  Allergen Reactions  . Penicillins Shortness Of Breath, Rash and Other (See Comments)    Has patient had a PCN reaction causing immediate rash, facial/tongue/throat swelling, SOB or lightheadedness with hypotension: Yes Has patient  had a PCN reaction causing severe rash involving mucus membranes or skin necrosis: Yes Has patient had a PCN reaction that required hospitalization No Has patient had a PCN reaction occurring within the last 10 years: Yes If all of the above answers are "NO", then may proceed with Cephalosporin use.   . Clindamycin/Lincomycin Other (See Comments)    Severe stomach cramps  . Ace Inhibitors Other (See Comments) and Cough    REACTION: cough; denies airway involvement     History of Present Illness      Stacey Roberts is a 54 y.o. female with PMH as above.  She has a history of chest pain and palpitations, DM2, hypertension, hyperlipidemia, and breast cancer.  She also reports a strong family history of early cardiac disease. She notes a stress test performed by Dr. Rockey Situ many years ago though records are not available per review of EMR. She was also seen in 2014 by Dr. Percival Spanish for evaluation of palpitations with atrial bigeminy noted.  Echo at that time showed mild LVH and was otherwise unremarkable.  She presented to [facility] emergency department in late April 2021 with chest pain and palpitations.  She presented after 4 days of intermittent palpitations, described as a fluttering in her chest with associated lightheadedness.  She also noted occasional squeezing chest discomfort associated with the palpitations.  She reported a similar episode of palpitations approximately 10 years earlier, though they were not as bad in severity.  ED work-up was unrevealing.    She followed up with [facility] on 05/23/2019.  She reported ongoing palpitations with more sustained fluttering in her chest over the last week with associated lightheadedness.  Earlier that same day (05/23/2019), she had stabbing pains at rest in her left upper back adjacent to her shoulder blade while driving.  The pain was further described as non-positional, not pleuritic, lasting for 1-2 seconds, and without associated symptoms.  She noted concern given her strong family history of premature CAD.  She had driven to [place] and back a few weeks earlier by car but denied any s/sx of  DVT/PE with D-dimer checked and negative. Isolated PACs were noted on EKG and not a new finding.   Blood pressure in clinic was mildly elevated, which she stated today was unusual for her.  She was started on ASA 81 mg daily with metoprolol tartrate 25 mg twice daily and continued on losartan.  Recommendation was to obtain echocardiogram and  cardiac CTA.  If this work-up was unrevealing, it was noted future considerations could include ZIO monitoring but deferred at that time.  05/29/19 echo showed LVEF 60-65% with NRWMA and trivial AR.     Cardiac CTA was performed 06/14/2019: CAC score = 1,373. (99th percentile for her age and sex) Severe (<70%) plaque = RCA & OM1. (CAD RADS 4)  Severe dz (>70%) mRCA and severe low-attenuation plaque of dRCA.    Minimal (< 25%) calcified plaque of LM.    LAD with moderate (50-69%) diffuse dz.   LCx with mild to moderate (<70%) mixed plaque.   OM1 was large with pOM1 showing severe (>70%) mixed plaque; m/dOM1 with moderate (50-69%) mixed plaque.  FFRct = significant stenosis of the LCx, OM1, and RCA.   dLCx 0.64, OM1 0.66, mRCA 0.72, and dRCA 0.59.   LAD FFRct indeterminate with dLAD 0.73.    Based on the results of her cardiac CTA, recommendation was that she present sooner than scheduled to clinic to reassess her  symptoms and discuss further ischemic work-up with cardiac catheterization.   She presents today, 06/16/2019, and denies any further recurrence of the chest pain as described above.  She reports improvement in her palpitations and blood pressure since starting on her metoprolol; however, she continued to have elevated rates with clinic heart rate 92 bpm today and BP 110/70.  She reported adequate hydration with close to 2 L of fluid daily.  She denied excess caffeine use or tobacco use.  No presyncope or syncope.  No recent falls. No SOB/DOE. She denied pnd, orthopnea, abdominal distention, increased LEE, weight gain, or early satiety. No s/sx of breathing. She denies contrast allergies or poor reactions to anesthesia.  She is eager to proceed with catheterization.   Home Medications    Prior to Admission medications   Medication Sig Start Date End Date Taking? Authorizing Provider  aspirin EC 81 MG tablet Take 1 tablet (81 mg total) by mouth daily. 05/23/19   End, Harrell Gave, MD    Blood Glucose Monitoring Suppl (FREESTYLE LITE) DEVI 1 each by Does not apply route 2 (two) times daily. [name] 02/03/19   Renato Shin, MD  calcium carbonate (OSCAL) 1500 (600 Ca) MG TABS tablet Take by mouth daily with breakfast.    [provider]  dapagliflozin propanediol (FARXIGA) 10 MG TABS tablet Take 10 mg by mouth daily. 07/03/18   Renato Shin, MD  gabapentin (NEURONTIN) 300 MG capsule Take 2 capsules (600 mg total) by mouth at bedtime. 08/28/18   Magrinat, Virgie Dad, MD  glucose blood (FREESTYLE LITE) test strip 1 each by Other route 2 (two) times daily. [name] 02/03/19   Renato Shin, MD  Insulin Glargine [facility]) 100 UNIT/ML Inject 0.5 mLs (50 Units total) into the skin every morning. 04/14/19   Renato Shin, MD  Lancets (FREESTYLE) lancets 1 each by Other route 2 (two) times daily. [name] 02/03/19   Renato Shin, MD  levonorgestrel (MIRENA) 20 MCG/24HR IUD 1 each by Intrauterine route once. 08/23/12   [provider]  levothyroxine (SYNTHROID) 137 MCG tablet Take 1 tablet (137 mcg total) by mouth daily before breakfast. 04/15/19   Renato Shin, MD  losartan (COZAAR) 50 MG tablet Take 1 tablet (50 mg total) by mouth daily. 01/28/19   Jearld Fenton, NP  metFORMIN (GLUCOPHAGE) 1000 MG tablet TAKE 1 TABLET BY MOUTH 2 TIMES DAILY WITH A MEAL. 04/07/19   Renato Shin, MD  metoprolol tartrate (LOPRESSOR) 25 MG tablet Take 1 tablet (25 mg total) by mouth 2 (two) times daily. 05/23/19   End, Harrell Gave, MD  pantoprazole (PROTONIX) 40 MG tablet TAKE 1 TABLET (40 MG TOTAL) BY MOUTH DAILY. MUST SCHEDULE PHYSICAL EXAM 12/02/18   Jearld Fenton, NP  pregabalin (LYRICA) 75 MG capsule Take 1 capsule (75 mg total) by mouth 2 (two) times daily. 01/22/19   Hyatt, Max T, DPM  rosuvastatin (CRESTOR) 20 MG tablet Take 1 tablet (20 mg total) by mouth daily. 06/02/19   Jearld Fenton, NP  tamoxifen (NOLVADEX) 20 MG tablet Take 1 tablet (20 mg total) by mouth daily. 12/16/18   Magrinat,  Virgie Dad, MD    Review of Systems    She denies dyspnea, pnd, orthopnea, n, v, further CP / dizziness episodes since previous CP episode, syncope, increased LEE, weight gain, or early satiety. She reports improved but ongoing palpitations.   All other systems reviewed and are otherwise negative except as noted above.  Physical Exam    VS:  BP 110/70 (BP  Location: Left Arm, Patient Position: Sitting, Cuff Size: Large)   Pulse 92   Ht _0  (1.626 m)   Wt 234 lb 2 oz (106.2 kg)   SpO2 97%   BMI 40.19 kg/m  , BMI Body mass index is 40.19 kg/m. GEN: Well nourished, well developed, in no acute distress. HEENT: normal. Neck: Supple, no JVD, carotid bruits, or masses. Cardiac: RRR, no murmurs, rubs, or gallops. No clubbing, cyanosis, edema.  Radials/DP/PT 2+ and equal bilaterally.  Respiratory:  Respirations regular and unlabored, clear to auscultation bilaterally. GI: Soft, nontender, nondistended, BS + x 4. MS: no deformity or atrophy. Skin: warm and dry, no rash. Neuro:  Strength and sensation are intact. Psych: Normal affect.  Accessory Clinical Findings    ECG personally reviewed by me today - NSR, 92bpm, resolution of previous PACs, improved R/S ratio in V1 when compared with prior tracing - no acute changes.  VITALS Reviewed today   Temp Readings from Last 3 Encounters:  05/22/19 97.6 F (36.4 C) (Temporal)  05/19/19 98.3 F (36.8 C) (Oral)  11/18/18 98.2 F (36.8 C) (Temporal)   BP Readings from Last 3 Encounters:  06/16/19 110/70  06/12/19 126/65  05/23/19 (!) 140/92   Pulse Readings from Last 3 Encounters:  06/16/19 92  06/12/19 65  05/23/19 99    Wt Readings from Last 3 Encounters:  06/16/19 234 lb 2 oz (106.2 kg)  05/23/19 230 lb 6 oz (104.5 kg)  05/22/19 230 lb (104.3 kg)     LABS  reviewed today   Lab Results  Component Value Date   WBC 9.7 05/19/2019   HGB 14.0 05/19/2019   HCT 45.8 05/19/2019   MCV 87.9 05/19/2019   PLT 255 05/19/2019   Lab  Results  Component Value Date   CREATININE 0.69 05/19/2019   BUN 13 05/19/2019   NA 140 05/19/2019   K 4.3 05/19/2019   CL 103 05/19/2019   CO2 25 05/19/2019   Lab Results  Component Value Date   ALT 16 11/18/2018   AST 0 11/18/2018   ALKPHOS 99 11/18/2018   BILITOT 0.4 11/18/2018   Lab Results  Component Value Date   CHOL 104 03/21/2019   HDL 35.50 (L) 03/21/2019   LDLCALC 34 03/21/2019   LDLDIRECT 153.0 09/01/2014   TRIG 172.0 (H) 03/21/2019   CHOLHDL 3 03/21/2019    Lab Results  Component Value Date   HGBA1C 7.9 (A) 04/14/2019   Lab Results  Component Value Date   TSH 0.60 05/22/2019     STUDIES/PROCEDURES reviewed today   Echo 05/29/19 1. Left ventricular ejection fraction, by estimation, is 60 to 65%. The  left ventricle has normal function. The left ventricle has no regional  wall motion abnormalities. Left ventricular diastolic parameters were  normal.  2. Right ventricular systolic function is normal. The right ventricular  size is normal.  3. The mitral valve is grossly normal. No evidence of mitral valve  regurgitation.  4. The aortic valve is tricuspid. Aortic valve regurgitation is trivial.  No aortic stenosis is present.  5. The inferior vena cava is normal in size with greater than 50%  respiratory variability, suggesting right atrial pressure of 3 mmHg.   Cardiac CTA and FFRct 06/12/19 ADDENDUM REPORT: 06/14/2019 15:35 EXAM: CT FFR ANALYSIS CLINICAL DATA:  58F with abnormal coronary CT-A. FINDINGS: Normal FFR range is >0.80. 1. Left Main:  No significant stenosis.  FFRct 0.99. 2. LAD: No significant stenosis. FFRct 0.96 proximal, 0.83 mid, 0.73  distal. 3. LCX: Significant stenosis. FFRct 0.94 proximal, 0.64 distal (abnormal). OM1 0.66 (abnormal). 4. RCA: Significant stenosis. FFRct 0.99 proximal, 0.72 mid, 0.59 distal. IMPRESSION: 1.  CT FFR indicates significant stenosis in the LCX, OM1, and RCA. 2.  LAD FFRct  indeterminate. IMPRESSION: Aorta: Normal size. Ascending aorta 3.4 cm. Mild calcification of the aortic root. No dissection. Aortic Valve:  Trileaflet.  No calcifications. Coronary Arteries:  Normal coronary origin.  Right dominance. RCA is a large dominant artery that gives rise to PDA and PLVB. There is severe (>70%) calcified plaque in the mid RCA and severe low attenuation plaque in the distal RCA. Left main is a large artery that gives rise to LAD and LCX arteries. There is minimal (<25%) calcified plaque. LAD is a large vessel that is diffusely diseased and heavily calcified. There is moderate (50-69%) diffuse calcified plaque. There are two small diagonal vessels. LCX is a non-dominant artery that gives rise to one large OM1 branch. There is diffuse mild to moderate (<70%) mixed plaque. There is a large OM1 with severe (>70%) mixed plaque proximally and moderate (50-69%) mixed plaque in the mid to distal vessel. Other findings: Normal pulmonary vein drainage into the left atrium. Normal let atrial appendage without a thrombus. Normal size of the pulmonary artery. 1. Coronary calcium score of 1373. This was 99th percentile for age and sex matched control. 2. Normal coronary origin with right dominance. 3. Coronary arteries are diffusely diseased. There is severe (>70%) plaque in the RCA and OM1. CAD RADS 4. 4.  Will send study for FFRct.  ---- Cardiovascular Problems:  Chest pain  Palpitations  Risk Factors:  Hypertension, diabetes mellitus, family history, and obesity.  Cath/PCI:  None  CV Surgery:  None  EP Procedures and Devices:  None  Non-Invasive Evaluation(s):  TTE (11/28/2012): Normal LV size with mild LVH.  LVEF 60% with normal wall motion.  Normal RV size and function.  No significant valvular abnormality.  TTE updated as above 05/2019  CTA as above 05/2019    Assessment & Plan    Chest Pain / Unstable Angina CAD by CTA; Abnormal  Cardiac CTA with FFR Family history of CAD --Denies further episodes of chest pain or upper back pain as described in HPI above.   --Risk factors for CAD include family history, former tobacco use, HTN, DM2, BMI.  --Echo showed normal LVSF and trivial AR.   --Cardiac CTA with CAC score 1373, placing her at the 99th percentile for age and sex.  Cors diffusely diseased with severe plaque of RCA and OM1. FFR with significant stenosis in the LCX, OM1, and RCA. --Given her cardiac CTA results, recommendation was for further ischemic work-up with LHC. Risks and benefits of cardiac catheterization have been discussed with the patient.  These include bleeding, infection, kidney damage, stroke, heart attack, death.  The patient understands these risks and is willing to proceed. Obtain CBC and BMET. Continue ASA and BB. Despite elevated rate, will defer escalation of BB given soft BP today. Hold metformin before & 48h after LHC. Instructions provided for insulin. She will need tested for COVID-19 as well. Recommend risk factor modification with statin and goal LDL below 70 given her CAD on CTA. Ongoing lifestyle changes recommended asa well.   Palpitations PACs --Improved following BB. PACs on previous EKG resolved today. Echo as above with nl EF and trivial TR.  CTA as above with diffuse dz and stenosis noted with plan for LHC.  Consider palpitations as secondary  to CAD above. Reassess sx following catheterization, and if sx persist, could consider Zio at that time. Continue current metoprolol tartrate 51m BID.   Hypertension --BP improved 110/70.  Continue current metoprolol tartrate 25 mg twice daily with losartan.  Will defer increasing beta-blocker at this time due to softer pressures today.  Reassess at RTC.  Further recommendations regarding ARB if needed pending recheck of renal function / BMET.  HLD, hypertriglyceridemia  --Recommend high intensity statin and goal LDL below 70. Currently on Crestor  28mdaily. Recheck lipids and if lnot at goal, consider increasing Crestor dose or transitioning to atorvastatin 8064m+/- Zetia 47m29mr optimal control.   DM2 --Optimal control recommended for risk factor modification.    Medication changes: None Labs ordered: BMET, CBC, COVID-19 test Studies / Imaging ordered: LHC with cor angiography and possible PCI Future considerations: Zio if continued palpitations after LHC +/- PCI Disposition: RTC after catheterization   JacqArvil Chaco-C 06/16/2019

## 2019-06-16 NOTE — Telephone Encounter (Signed)
No answer. Left message to call back.   

## 2019-06-16 NOTE — Progress Notes (Signed)
Office Visit    Patient Name: Stacey Roberts Date of Encounter: 06/16/2019  Primary Care Provider:  Jearld Fenton, NP Primary Cardiologist:  Nelva Bush, MD  Chief Complaint    Chief Complaint  Patient presents with  . office visit    Pt has no complaints. Meds verbally reviewed w/ pt.     54 year old female with history of chest pain and palpitations, recent coronary artery calcium score of 1373 placing her at the 99th percentile for her age and sex, DM2, hypertension, hyperlipidemia, former tobacco use (quit 2017), and breast cancer, and who presents today for follow-up of recent cardiac CTA with FFR and to discuss cardiac catheterization.  Past Medical History    Past Medical History:  Diagnosis Date  . Allergy    seasonal  . BRCA negative 2017  . Breast cancer ([place]) 2014  . Breast cancer ([place]) 2017  . Bronchitis    hx of  . Diabetes mellitus, type II ([place])   . Endometrial hyperplasia 2014   Mirena placed; Dr. Carren Rang  . GERD (gastroesophageal reflux disease)   . Headache   . History of radiation therapy 12/01/15-01/11/16   Left breast DIBH / 50.4 Gy in 28 fractions and Left breast boost / 12 Gy in 6 fractions  . Hyperlipidemia   . Hypertension   . Hypothyroidism   . Joint pain   . Obesity   . PCOS (polycystic ovarian syndrome)   . Personal history of radiation therapy 2017  . Pneumonia    as a child  . Sleep apnea 2008   severe OSA-does not use a cpap  . Snoring   . 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 Buddy Duty   Past Surgical History:  Procedure Laterality Date  . BREAST BIOPSY  2000   s/p  . BREAST BIOPSY Left 01/22/2013   Procedure: RE-EXCISION LEFT BREAST DCIS;  Surgeon: Harl Bowie, MD;  Location: [place];  Service: General;  Laterality: Left;  . BREAST LUMPECTOMY Left 01/06/2013   Procedure: LUMPECTOMY;   Surgeon: Harl Bowie, MD;  Location: [place];  Service: General;  Laterality: Left;  . BREAST LUMPECTOMY Left 2017  . BREAST LUMPECTOMY WITH NEEDLE LOCALIZATION AND AXILLARY SENTINEL LYMPH NODE BX Left 09/30/2015   Procedure: Left BREAST LUMPECTOMY WITH NEEDLE LOCALIZATION AND AXILLARY SENTINEL LYMPH NODE BX;  Surgeon: Coralie Keens, MD;  Location: [place];  Service: General;  Laterality: Left;  . BREAST RECONSTRUCTION Bilateral 10/12/2015   Procedure: BREAST oncoplastic RECONSTRUCTION;  Surgeon: Irene Limbo, MD;  Location: [place];  Service: Plastics;  Laterality: Bilateral;  . BREAST REDUCTION SURGERY Bilateral 10/12/2015   Procedure: MAMMARY REDUCTION  (BREAST);  Surgeon: Irene Limbo, MD;  Location: [place];  Service: Plastics;  Laterality: Bilateral;  . DILATION AND CURETTAGE OF UTERUS  2004   s/p  . EXCISION OF BREAST LESION Left 10/12/2015   Procedure: Re-EXCISION OF BREAST;  Surgeon: Coralie Keens, MD;  Location: [place];  Service: General;  Laterality: Left;  . REDUCTION MAMMAPLASTY Bilateral 09/2015  . THYROIDECTOMY  02/2009   Dr Harlow Asa  . TONSILLECTOMY  1982    Allergies  Allergies  Allergen Reactions  . Penicillins Shortness Of Breath, Rash and Other (See Comments)    Has patient had a PCN reaction causing immediate rash, facial/tongue/throat swelling, SOB or lightheadedness with hypotension: Yes Has patient  had a PCN reaction causing severe rash involving mucus membranes or skin necrosis: Yes Has patient had a PCN reaction that required hospitalization No Has patient had a PCN reaction occurring within the last 10 years: Yes If all of the above answers are "NO", then may proceed with Cephalosporin use.   . Clindamycin/Lincomycin Other (See Comments)    Severe stomach cramps  . Ace Inhibitors Other (See Comments) and Cough    REACTION: cough; denies airway involvement     History of Present Illness      Stacey Roberts is a 54 y.o. female with PMH as above.  She has a history of chest pain and palpitations, DM2, hypertension, hyperlipidemia, and breast cancer.  She also reports a strong family history of early cardiac disease. She notes a stress test performed by Dr. Rockey Situ many years ago though records are not available per review of EMR. She was also seen in 2014 by Dr. Percival Spanish for evaluation of palpitations with atrial bigeminy noted.  Echo at that time showed mild LVH and was otherwise unremarkable.  She presented to [facility] emergency department in late April 2021 with chest pain and palpitations.  She presented after 4 days of intermittent palpitations, described as a fluttering in her chest with associated lightheadedness.  She also noted occasional squeezing chest discomfort associated with the palpitations.  She reported a similar episode of palpitations approximately 10 years earlier, though they were not as bad in severity.  ED work-up was unrevealing.    She followed up with [facility] on 05/23/2019.  She reported ongoing palpitations with more sustained fluttering in her chest over the last week with associated lightheadedness.  Earlier that same day (05/23/2019), she had stabbing pains at rest in her left upper back adjacent to her shoulder blade while driving.  The pain was further described as non-positional, not pleuritic, lasting for 1-2 seconds, and without associated symptoms.  She noted concern given her strong family history of premature CAD.  She had driven to [place] and back a few weeks earlier by car but denied any s/sx of  DVT/PE with D-dimer checked and negative. Isolated PACs were noted on EKG and not a new finding.   Blood pressure in clinic was mildly elevated, which she stated today was unusual for her.  She was started on ASA 81 mg daily with metoprolol tartrate 25 mg twice daily and continued on losartan.  Recommendation was to obtain echocardiogram and  cardiac CTA.  If this work-up was unrevealing, it was noted future considerations could include ZIO monitoring but deferred at that time.  05/29/19 echo showed LVEF 60-65% with NRWMA and trivial AR.     Cardiac CTA was performed 06/14/2019: CAC score = 1,373. (99th percentile for her age and sex) Severe (<70%) plaque = RCA & OM1. (CAD RADS 4)  Severe dz (>70%) mRCA and severe low-attenuation plaque of dRCA.    Minimal (< 25%) calcified plaque of LM.    LAD with moderate (50-69%) diffuse dz.   LCx with mild to moderate (<70%) mixed plaque.   OM1 was large with pOM1 showing severe (>70%) mixed plaque; m/dOM1 with moderate (50-69%) mixed plaque.  FFRct = significant stenosis of the LCx, OM1, and RCA.   dLCx 0.64, OM1 0.66, mRCA 0.72, and dRCA 0.59.   LAD FFRct indeterminate with dLAD 0.73.    Based on the results of her cardiac CTA, recommendation was that she present sooner than scheduled to clinic to reassess her  symptoms and discuss further ischemic work-up with cardiac catheterization.   She presents today, 06/16/2019, and denies any further recurrence of the chest pain as described above.  She reports improvement in her palpitations and blood pressure since starting on her metoprolol; however, she continued to have elevated rates with clinic heart rate 92 bpm today and BP 110/70.  She reported adequate hydration with close to 2 L of fluid daily.  She denied excess caffeine use or tobacco use.  No presyncope or syncope.  No recent falls. No SOB/DOE. She denied pnd, orthopnea, abdominal distention, increased LEE, weight gain, or early satiety. No s/sx of breathing. She denies contrast allergies or poor reactions to anesthesia.  She is eager to proceed with catheterization.   Home Medications    Prior to Admission medications   Medication Sig Start Date End Date Taking? Authorizing Provider  aspirin EC 81 MG tablet Take 1 tablet (81 mg total) by mouth daily. 05/23/19   End, Harrell Gave, MD    Blood Glucose Monitoring Suppl (FREESTYLE LITE) DEVI 1 each by Does not apply route 2 (two) times daily. [name] 02/03/19   Renato Shin, MD  calcium carbonate (OSCAL) 1500 (600 Ca) MG TABS tablet Take by mouth daily with breakfast.    [provider]  dapagliflozin propanediol (FARXIGA) 10 MG TABS tablet Take 10 mg by mouth daily. 07/03/18   Renato Shin, MD  gabapentin (NEURONTIN) 300 MG capsule Take 2 capsules (600 mg total) by mouth at bedtime. 08/28/18   Magrinat, Virgie Dad, MD  glucose blood (FREESTYLE LITE) test strip 1 each by Other route 2 (two) times daily. [name] 02/03/19   Renato Shin, MD  Insulin Glargine [facility]) 100 UNIT/ML Inject 0.5 mLs (50 Units total) into the skin every morning. 04/14/19   Renato Shin, MD  Lancets (FREESTYLE) lancets 1 each by Other route 2 (two) times daily. [name] 02/03/19   Renato Shin, MD  levonorgestrel (MIRENA) 20 MCG/24HR IUD 1 each by Intrauterine route once. 08/23/12   [provider]  levothyroxine (SYNTHROID) 137 MCG tablet Take 1 tablet (137 mcg total) by mouth daily before breakfast. 04/15/19   Renato Shin, MD  losartan (COZAAR) 50 MG tablet Take 1 tablet (50 mg total) by mouth daily. 01/28/19   Jearld Fenton, NP  metFORMIN (GLUCOPHAGE) 1000 MG tablet TAKE 1 TABLET BY MOUTH 2 TIMES DAILY WITH A MEAL. 04/07/19   Renato Shin, MD  metoprolol tartrate (LOPRESSOR) 25 MG tablet Take 1 tablet (25 mg total) by mouth 2 (two) times daily. 05/23/19   End, Harrell Gave, MD  pantoprazole (PROTONIX) 40 MG tablet TAKE 1 TABLET (40 MG TOTAL) BY MOUTH DAILY. MUST SCHEDULE PHYSICAL EXAM 12/02/18   Jearld Fenton, NP  pregabalin (LYRICA) 75 MG capsule Take 1 capsule (75 mg total) by mouth 2 (two) times daily. 01/22/19   Hyatt, Max T, DPM  rosuvastatin (CRESTOR) 20 MG tablet Take 1 tablet (20 mg total) by mouth daily. 06/02/19   Jearld Fenton, NP  tamoxifen (NOLVADEX) 20 MG tablet Take 1 tablet (20 mg total) by mouth daily. 12/16/18   Magrinat,  Virgie Dad, MD    Review of Systems    She denies dyspnea, pnd, orthopnea, n, v, further CP / dizziness episodes since previous CP episode, syncope, increased LEE, weight gain, or early satiety. She reports improved but ongoing palpitations.   All other systems reviewed and are otherwise negative except as noted above.  Physical Exam    VS:  BP 110/70 (BP  Location: Left Arm, Patient Position: Sitting, Cuff Size: Large)   Pulse 92   Ht _0  (1.626 m)   Wt 234 lb 2 oz (106.2 kg)   SpO2 97%   BMI 40.19 kg/m  , BMI Body mass index is 40.19 kg/m. GEN: Well nourished, well developed, in no acute distress. HEENT: normal. Neck: Supple, no JVD, carotid bruits, or masses. Cardiac: RRR, no murmurs, rubs, or gallops. No clubbing, cyanosis, edema.  Radials/DP/PT 2+ and equal bilaterally.  Respiratory:  Respirations regular and unlabored, clear to auscultation bilaterally. GI: Soft, nontender, nondistended, BS + x 4. MS: no deformity or atrophy. Skin: warm and dry, no rash. Neuro:  Strength and sensation are intact. Psych: Normal affect.  Accessory Clinical Findings    ECG personally reviewed by me today - NSR, 92bpm, resolution of previous PACs, improved R/S ratio in V1 when compared with prior tracing - no acute changes.  VITALS Reviewed today   Temp Readings from Last 3 Encounters:  05/22/19 97.6 F (36.4 C) (Temporal)  05/19/19 98.3 F (36.8 C) (Oral)  11/18/18 98.2 F (36.8 C) (Temporal)   BP Readings from Last 3 Encounters:  06/16/19 110/70  06/12/19 126/65  05/23/19 (!) 140/92   Pulse Readings from Last 3 Encounters:  06/16/19 92  06/12/19 65  05/23/19 99    Wt Readings from Last 3 Encounters:  06/16/19 234 lb 2 oz (106.2 kg)  05/23/19 230 lb 6 oz (104.5 kg)  05/22/19 230 lb (104.3 kg)     LABS  reviewed today   Lab Results  Component Value Date   WBC 9.7 05/19/2019   HGB 14.0 05/19/2019   HCT 45.8 05/19/2019   MCV 87.9 05/19/2019   PLT 255 05/19/2019   Lab  Results  Component Value Date   CREATININE 0.69 05/19/2019   BUN 13 05/19/2019   NA 140 05/19/2019   K 4.3 05/19/2019   CL 103 05/19/2019   CO2 25 05/19/2019   Lab Results  Component Value Date   ALT 16 11/18/2018   AST 0 11/18/2018   ALKPHOS 99 11/18/2018   BILITOT 0.4 11/18/2018   Lab Results  Component Value Date   CHOL 104 03/21/2019   HDL 35.50 (L) 03/21/2019   LDLCALC 34 03/21/2019   LDLDIRECT 153.0 09/01/2014   TRIG 172.0 (H) 03/21/2019   CHOLHDL 3 03/21/2019    Lab Results  Component Value Date   HGBA1C 7.9 (A) 04/14/2019   Lab Results  Component Value Date   TSH 0.60 05/22/2019     STUDIES/PROCEDURES reviewed today   Echo 05/29/19 1. Left ventricular ejection fraction, by estimation, is 60 to 65%. The  left ventricle has normal function. The left ventricle has no regional  wall motion abnormalities. Left ventricular diastolic parameters were  normal.  2. Right ventricular systolic function is normal. The right ventricular  size is normal.  3. The mitral valve is grossly normal. No evidence of mitral valve  regurgitation.  4. The aortic valve is tricuspid. Aortic valve regurgitation is trivial.  No aortic stenosis is present.  5. The inferior vena cava is normal in size with greater than 50%  respiratory variability, suggesting right atrial pressure of 3 mmHg.   Cardiac CTA and FFRct 06/12/19 ADDENDUM REPORT: 06/14/2019 15:35 EXAM: CT FFR ANALYSIS CLINICAL DATA:  58F with abnormal coronary CT-A. FINDINGS: Normal FFR range is >0.80. 1. Left Main:  No significant stenosis.  FFRct 0.99. 2. LAD: No significant stenosis. FFRct 0.96 proximal, 0.83 mid, 0.73  distal. 3. LCX: Significant stenosis. FFRct 0.94 proximal, 0.64 distal (abnormal). OM1 0.66 (abnormal). 4. RCA: Significant stenosis. FFRct 0.99 proximal, 0.72 mid, 0.59 distal. IMPRESSION: 1.  CT FFR indicates significant stenosis in the LCX, OM1, and RCA. 2.  LAD FFRct  indeterminate. IMPRESSION: Aorta: Normal size. Ascending aorta 3.4 cm. Mild calcification of the aortic root. No dissection. Aortic Valve:  Trileaflet.  No calcifications. Coronary Arteries:  Normal coronary origin.  Right dominance. RCA is a large dominant artery that gives rise to PDA and PLVB. There is severe (>70%) calcified plaque in the mid RCA and severe low attenuation plaque in the distal RCA. Left main is a large artery that gives rise to LAD and LCX arteries. There is minimal (<25%) calcified plaque. LAD is a large vessel that is diffusely diseased and heavily calcified. There is moderate (50-69%) diffuse calcified plaque. There are two small diagonal vessels. LCX is a non-dominant artery that gives rise to one large OM1 branch. There is diffuse mild to moderate (<70%) mixed plaque. There is a large OM1 with severe (>70%) mixed plaque proximally and moderate (50-69%) mixed plaque in the mid to distal vessel. Other findings: Normal pulmonary vein drainage into the left atrium. Normal let atrial appendage without a thrombus. Normal size of the pulmonary artery. 1. Coronary calcium score of 1373. This was 99th percentile for age and sex matched control. 2. Normal coronary origin with right dominance. 3. Coronary arteries are diffusely diseased. There is severe (>70%) plaque in the RCA and OM1. CAD RADS 4. 4.  Will send study for FFRct.  ---- Cardiovascular Problems:  Chest pain  Palpitations  Risk Factors:  Hypertension, diabetes mellitus, family history, and obesity.  Cath/PCI:  None  CV Surgery:  None  EP Procedures and Devices:  None  Non-Invasive Evaluation(s):  TTE (11/28/2012): Normal LV size with mild LVH.  LVEF 60% with normal wall motion.  Normal RV size and function.  No significant valvular abnormality.  TTE updated as above 05/2019  CTA as above 05/2019    Assessment & Plan    Chest Pain / Unstable Angina CAD by CTA; Abnormal  Cardiac CTA with FFR Family history of CAD --Denies further episodes of chest pain or upper back pain as described in HPI above.   --Risk factors for CAD include family history, former tobacco use, HTN, DM2, BMI.  --Echo showed normal LVSF and trivial AR.   --Cardiac CTA with CAC score 1373, placing her at the 99th percentile for age and sex.  Cors diffusely diseased with severe plaque of RCA and OM1. FFR with significant stenosis in the LCX, OM1, and RCA. --Given her cardiac CTA results, recommendation was for further ischemic work-up with LHC. Risks and benefits of cardiac catheterization have been discussed with the patient.  These include bleeding, infection, kidney damage, stroke, heart attack, death.  The patient understands these risks and is willing to proceed. Obtain CBC and BMET. Continue ASA and BB. Despite elevated rate, will defer escalation of BB given soft BP today. Hold metformin before & 48h after LHC. Instructions provided for insulin. She will need tested for COVID-19 as well. Recommend risk factor modification with statin and goal LDL below 70 given her CAD on CTA. Ongoing lifestyle changes recommended asa well.   Palpitations PACs --Improved following BB. PACs on previous EKG resolved today. Echo as above with nl EF and trivial TR.  CTA as above with diffuse dz and stenosis noted with plan for LHC.  Consider palpitations as secondary  to CAD above. Reassess sx following catheterization, and if sx persist, could consider Zio at that time. Continue current metoprolol tartrate 51m BID.   Hypertension --BP improved 110/70.  Continue current metoprolol tartrate 25 mg twice daily with losartan.  Will defer increasing beta-blocker at this time due to softer pressures today.  Reassess at RTC.  Further recommendations regarding ARB if needed pending recheck of renal function / BMET.  HLD, hypertriglyceridemia  --Recommend high intensity statin and goal LDL below 70. Currently on Crestor  28mdaily. Recheck lipids and if lnot at goal, consider increasing Crestor dose or transitioning to atorvastatin 8064m+/- Zetia 47m29mr optimal control.   DM2 --Optimal control recommended for risk factor modification.    Medication changes: None Labs ordered: BMET, CBC, COVID-19 test Studies / Imaging ordered: LHC with cor angiography and possible PCI Future considerations: Zio if continued palpitations after LHC +/- PCI Disposition: RTC after catheterization   JacqArvil Chaco-C 06/16/2019

## 2019-06-16 NOTE — Telephone Encounter (Signed)
-----   Message from Nelva Bush, MD sent at 06/13/2019 10:56 PM EDT ----- Please let Stacey Roberts know that her cardiac CTA and CT FFR indicate significant narrowing in two heart arteries/branches, which could explain the symptoms that she described at our recent visit.  I recommend that we try to move up her office appointment to see me or APP to reassess her symptoms and discuss further testing (i.e. cardiac catheterization).

## 2019-06-16 NOTE — Telephone Encounter (Signed)
Results called to pt. Pt verbalized understanding.  

## 2019-06-19 ENCOUNTER — Ambulatory Visit: Payer: No Typology Code available for payment source | Admitting: Endocrinology

## 2019-06-19 ENCOUNTER — Other Ambulatory Visit: Payer: No Typology Code available for payment source

## 2019-06-24 DIAGNOSIS — I2510 Atherosclerotic heart disease of native coronary artery without angina pectoris: Secondary | ICD-10-CM

## 2019-06-24 HISTORY — DX: Atherosclerotic heart disease of native coronary artery without angina pectoris: I25.10

## 2019-06-25 ENCOUNTER — Telehealth: Payer: Self-pay

## 2019-06-25 NOTE — Telephone Encounter (Signed)
Pt contacted pre-catheterization scheduled at [facility] for: 06/27/19 Verified arrival time and place: [facility]) at: 7:00 am    No solid food after midnight prior to cath, clear liquids until 5 AM day of procedure. Contrast allergy: NO  DO NOT TAKE ANY  DIABETES MEDICATIONS THE MORNING OF THE PROCEDURE.  AM meds can be  taken pre-cath with sip of water including: ASA 81 mg   Confirmed patient has responsible adult to drive home post procedure and observe 24 hours after arriving home:   You are allowed ONE visitor in the waiting room during your procedure. Both you and your visitor must wear masks.

## 2019-06-26 ENCOUNTER — Other Ambulatory Visit: Payer: Self-pay

## 2019-06-26 ENCOUNTER — Ambulatory Visit: Payer: No Typology Code available for payment source | Admitting: Internal Medicine

## 2019-06-26 ENCOUNTER — Other Ambulatory Visit
Admission: RE | Admit: 2019-06-26 | Discharge: 2019-06-26 | Disposition: A | Discharge status: Home / Self Care | Payer: No Typology Code available for payment source | Source: Ambulatory Visit | Attending: Internal Medicine | Admitting: Internal Medicine

## 2019-06-26 DIAGNOSIS — Z20822 Contact with and (suspected) exposure to covid-19: Secondary | ICD-10-CM | POA: Diagnosis not present

## 2019-06-26 DIAGNOSIS — Z01812 Encounter for preprocedural laboratory examination: Secondary | ICD-10-CM | POA: Insufficient documentation

## 2019-06-26 LAB — SARS CORONAVIRUS 2 (TAT 6-24 HRS): SARS Coronavirus 2: NEGATIVE

## 2019-06-27 ENCOUNTER — Ambulatory Visit (HOSPITAL_COMMUNITY)
Admission: RE | Admit: 2019-06-27 | Discharge: 2019-06-27 | Disposition: A | Discharge status: Home / Self Care | Payer: No Typology Code available for payment source | Attending: Internal Medicine | Admitting: Internal Medicine

## 2019-06-27 ENCOUNTER — Encounter (HOSPITAL_COMMUNITY)
Admission: RE | Disposition: A | Discharge status: Home / Self Care | Payer: No Typology Code available for payment source | Source: Home / Self Care | Attending: Internal Medicine

## 2019-06-27 DIAGNOSIS — R079 Chest pain, unspecified: Secondary | ICD-10-CM | POA: Diagnosis present

## 2019-06-27 DIAGNOSIS — Z7982 Long term (current) use of aspirin: Secondary | ICD-10-CM | POA: Insufficient documentation

## 2019-06-27 DIAGNOSIS — Z87891 Personal history of nicotine dependence: Secondary | ICD-10-CM | POA: Diagnosis not present

## 2019-06-27 DIAGNOSIS — Z6841 Body Mass Index (BMI) 40.0 and over, adult: Secondary | ICD-10-CM | POA: Insufficient documentation

## 2019-06-27 DIAGNOSIS — R931 Abnormal findings on diagnostic imaging of heart and coronary circulation: Secondary | ICD-10-CM | POA: Diagnosis present

## 2019-06-27 DIAGNOSIS — E890 Postprocedural hypothyroidism: Secondary | ICD-10-CM | POA: Insufficient documentation

## 2019-06-27 DIAGNOSIS — I25110 Atherosclerotic heart disease of native coronary artery with unstable angina pectoris: Secondary | ICD-10-CM | POA: Diagnosis not present

## 2019-06-27 DIAGNOSIS — Z888 Allergy status to other drugs, medicaments and biological substances status: Secondary | ICD-10-CM | POA: Diagnosis not present

## 2019-06-27 DIAGNOSIS — K219 Gastro-esophageal reflux disease without esophagitis: Secondary | ICD-10-CM | POA: Insufficient documentation

## 2019-06-27 DIAGNOSIS — E785 Hyperlipidemia, unspecified: Secondary | ICD-10-CM | POA: Insufficient documentation

## 2019-06-27 DIAGNOSIS — E119 Type 2 diabetes mellitus without complications: Secondary | ICD-10-CM | POA: Diagnosis not present

## 2019-06-27 DIAGNOSIS — I2584 Coronary atherosclerosis due to calcified coronary lesion: Secondary | ICD-10-CM | POA: Insufficient documentation

## 2019-06-27 DIAGNOSIS — Z794 Long term (current) use of insulin: Secondary | ICD-10-CM | POA: Diagnosis not present

## 2019-06-27 DIAGNOSIS — G4733 Obstructive sleep apnea (adult) (pediatric): Secondary | ICD-10-CM | POA: Insufficient documentation

## 2019-06-27 DIAGNOSIS — E669 Obesity, unspecified: Secondary | ICD-10-CM | POA: Insufficient documentation

## 2019-06-27 DIAGNOSIS — E781 Pure hyperglyceridemia: Secondary | ICD-10-CM | POA: Insufficient documentation

## 2019-06-27 DIAGNOSIS — Z880 Allergy status to penicillin: Secondary | ICD-10-CM | POA: Insufficient documentation

## 2019-06-27 DIAGNOSIS — I10 Essential (primary) hypertension: Secondary | ICD-10-CM | POA: Diagnosis not present

## 2019-06-27 DIAGNOSIS — Z793 Long term (current) use of hormonal contraceptives: Secondary | ICD-10-CM | POA: Insufficient documentation

## 2019-06-27 DIAGNOSIS — Z79899 Other long term (current) drug therapy: Secondary | ICD-10-CM | POA: Diagnosis not present

## 2019-06-27 DIAGNOSIS — I2510 Atherosclerotic heart disease of native coronary artery without angina pectoris: Secondary | ICD-10-CM | POA: Diagnosis not present

## 2019-06-27 DIAGNOSIS — Z8249 Family history of ischemic heart disease and other diseases of the circulatory system: Secondary | ICD-10-CM | POA: Insufficient documentation

## 2019-06-27 DIAGNOSIS — Z881 Allergy status to other antibiotic agents status: Secondary | ICD-10-CM | POA: Insufficient documentation

## 2019-06-27 DIAGNOSIS — E282 Polycystic ovarian syndrome: Secondary | ICD-10-CM | POA: Diagnosis not present

## 2019-06-27 HISTORY — PX: LEFT HEART CATH AND CORONARY ANGIOGRAPHY: CATH118249

## 2019-06-27 LAB — GLUCOSE, CAPILLARY
Glucose-Capillary: 200 mg/dL — ABNORMAL HIGH (ref 70–99)
Glucose-Capillary: 136 mg/dL — ABNORMAL HIGH (ref 70–99)

## 2019-06-27 LAB — PREGNANCY, URINE: Preg Test, Ur: NEGATIVE

## 2019-06-27 SURGERY — LEFT HEART CATH AND CORONARY ANGIOGRAPHY
Anesthesia: LOCAL

## 2019-06-27 MED ORDER — SODIUM CHLORIDE 0.9 % IV SOLN: 250.0000 mL | INTRAVENOUS | Status: DC | PRN

## 2019-06-27 MED ORDER — LIDOCAINE HCL (PF) 1 % IJ SOLN
INTRAMUSCULAR | Status: DC | PRN
  Administered 2019-06-27: 1 mL

## 2019-06-27 MED ORDER — HEPARIN (PORCINE) IN NACL 1000-0.9 UT/500ML-% IV SOLN
INTRAVENOUS | Status: DC | PRN
  Administered 2019-06-27 (×2): 500 mL

## 2019-06-27 MED ORDER — LIDOCAINE HCL (PF) 1 % IJ SOLN
INTRAMUSCULAR | Status: AC
  Filled 2019-06-27: qty 30

## 2019-06-27 MED ORDER — SODIUM CHLORIDE 0.9% FLUSH: 3.0000 mL | Freq: Two times a day (BID) | INTRAVENOUS | Status: DC

## 2019-06-27 MED ORDER — FENTANYL CITRATE (PF) 100 MCG/2ML IJ SOLN
INTRAMUSCULAR | Status: DC | PRN
  Administered 2019-06-27: 50 ug via INTRAVENOUS

## 2019-06-27 MED ORDER — IOHEXOL 350 MG/ML SOLN
INTRAVENOUS | Status: DC | PRN
  Administered 2019-06-27: 30 mL

## 2019-06-27 MED ORDER — HYDRALAZINE HCL 20 MG/ML IJ SOLN: 10.0000 mg | INTRAMUSCULAR | Status: DC | PRN

## 2019-06-27 MED ORDER — MIDAZOLAM HCL 2 MG/2ML IJ SOLN
INTRAMUSCULAR | Status: DC | PRN
  Administered 2019-06-27: 1 mg via INTRAVENOUS

## 2019-06-27 MED ORDER — NITROGLYCERIN 0.4 MG SL SUBL
0.4000 mg | SUBLINGUAL_TABLET | SUBLINGUAL | 99 refills | Status: AC | PRN
Start: 2019-06-27 — End: ?

## 2019-06-27 MED ORDER — FENTANYL CITRATE (PF) 100 MCG/2ML IJ SOLN
INTRAMUSCULAR | Status: AC
  Filled 2019-06-27: qty 2

## 2019-06-27 MED ORDER — VERAPAMIL HCL 2.5 MG/ML IV SOLN
INTRAVENOUS | Status: DC | PRN
  Administered 2019-06-27: 10 mL via INTRA_ARTERIAL

## 2019-06-27 MED ORDER — ACETAMINOPHEN 325 MG PO TABS: 650.0000 mg | ORAL_TABLET | ORAL | Status: DC | PRN

## 2019-06-27 MED ORDER — ASPIRIN 81 MG PO CHEW: 81.0000 mg | CHEWABLE_TABLET | ORAL | Status: DC

## 2019-06-27 MED ORDER — SODIUM CHLORIDE 0.9% FLUSH: 3.0000 mL | INTRAVENOUS | Status: DC | PRN

## 2019-06-27 MED ORDER — HEPARIN SODIUM (PORCINE) 1000 UNIT/ML IJ SOLN
INTRAMUSCULAR | Status: AC
  Filled 2019-06-27: qty 1

## 2019-06-27 MED ORDER — HEPARIN (PORCINE) IN NACL 1000-0.9 UT/500ML-% IV SOLN
INTRAVENOUS | Status: AC
  Filled 2019-06-27: qty 1000

## 2019-06-27 MED ORDER — MIDAZOLAM HCL 2 MG/2ML IJ SOLN
INTRAMUSCULAR | Status: AC
  Filled 2019-06-27: qty 2

## 2019-06-27 MED ORDER — HEPARIN SODIUM (PORCINE) 1000 UNIT/ML IJ SOLN
INTRAMUSCULAR | Status: DC | PRN
  Administered 2019-06-27: 5000 [IU] via INTRAVENOUS

## 2019-06-27 MED ORDER — VERAPAMIL HCL 2.5 MG/ML IV SOLN
INTRAVENOUS | Status: AC
  Filled 2019-06-27: qty 2

## 2019-06-27 MED ORDER — SODIUM CHLORIDE 0.9 % IV SOLN: INTRAVENOUS | Status: DC

## 2019-06-27 MED ORDER — LABETALOL HCL 5 MG/ML IV SOLN: 10.0000 mg | INTRAVENOUS | Status: DC | PRN

## 2019-06-27 MED ORDER — METOPROLOL TARTRATE 25 MG PO TABS: 50.0000 mg | ORAL_TABLET | Freq: Two times a day (BID) | ORAL | 3 refills | Status: DC

## 2019-06-27 MED ORDER — METFORMIN HCL 1000 MG PO TABS: 1000.0000 mg | ORAL_TABLET | Freq: Two times a day (BID) | ORAL | Status: DC

## 2019-06-27 MED ORDER — SODIUM CHLORIDE 0.9 % WEIGHT BASED INFUSION
1.0000 mL/kg/h | INTRAVENOUS | Status: DC
  Administered 2019-06-27: 1 mL/kg/h via INTRAVENOUS

## 2019-06-27 MED ORDER — ONDANSETRON HCL 4 MG/2ML IJ SOLN: 4.0000 mg | Freq: Four times a day (QID) | INTRAMUSCULAR | Status: DC | PRN

## 2019-06-27 MED ORDER — SODIUM CHLORIDE 0.9 % WEIGHT BASED INFUSION
3.0000 mL/kg/h | INTRAVENOUS | Status: AC
  Administered 2019-06-27: 3 mL/kg/h via INTRAVENOUS

## 2019-06-27 SURGICAL SUPPLY — 9 items
CATH OPTITORQUE TIG 4.0 5F (CATHETERS) ×2 IMPLANT
DEVICE RAD COMP TR BAND LRG (VASCULAR PRODUCTS) ×2 IMPLANT
GLIDESHEATH SLEND SS 6F .021 (SHEATH) ×2 IMPLANT
GUIDEWIRE INQWIRE 1.5J.035X260 (WIRE) ×1 IMPLANT
INQWIRE 1.5J .035X260CM (WIRE) ×2
KIT HEART LEFT (KITS) ×2 IMPLANT
PACK CARDIAC CATHETERIZATION (CUSTOM PROCEDURE TRAY) ×2 IMPLANT
TRANSDUCER W/STOPCOCK (MISCELLANEOUS) ×2 IMPLANT
TUBING CIL FLEX 10 FLL-RA (TUBING) ×2 IMPLANT

## 2019-06-27 NOTE — Interval H&P Note (Signed)
History and Physical Interval Note:  06/27/2019 11:19 AM  Stacey Roberts  has presented today for surgery, with the diagnosis of chest pain and abnormal cardiac CTA.  The various methods of treatment have been discussed with the patient and family. After consideration of risks, benefits and other options for treatment, the patient has consented to  Procedure(s): LEFT HEART CATH AND CORONARY ANGIOGRAPHY (N/A) as a surgical intervention.  The patient's history has been reviewed, patient examined, no change in status, stable for surgery.  I have reviewed the patient's chart and labs.  Questions were answered to the patient's satisfaction.    Cath Lab Visit (complete for each Cath Lab visit)  Clinical Evaluation Leading to the Procedure:   ACS: No.  Non-ACS:    Anginal Classification: CCS IV  Anti-ischemic medical therapy: Minimal Therapy (1 class of medications)  Non-Invasive Test Results: High-risk stress test findings: cardiac mortality >3%/year  Prior CABG: No previous CABG  Stacey Roberts

## 2019-06-27 NOTE — Discharge Instructions (Signed)
 Radial Site Care  This sheet gives you information about how to care for yourself after your procedure. Your health care provider may also give you more specific instructions. If you have problems or questions, contact your health care provider. What can I expect after the procedure? After the procedure, it is common to have:  Bruising and tenderness at the catheter insertion area. Follow these instructions at home: Medicines  Take over-the-counter and prescription medicines only as told by your health care provider. Insertion site care  Follow instructions from your health care provider about how to take care of your insertion site. Make sure you: ? Wash your hands with soap and water before you change your bandage (dressing). If soap and water are not available, use hand sanitizer. ? Change your dressing as told by your health care provider. ? Leave stitches (sutures), skin glue, or adhesive strips in place. These skin closures may need to stay in place for 2 weeks or longer. If adhesive strip edges start to loosen and curl up, you may trim the loose edges. Do not remove adhesive strips completely unless your health care provider tells you to do that.  Check your insertion site every day for signs of infection. Check for: ? Redness, swelling, or pain. ? Fluid or blood. ? Pus or a bad smell. ? Warmth.  Do not take baths, swim, or use a hot tub until your health care provider approves.  You may shower 24-48 hours after the procedure, or as directed by your health care provider. ? Remove the dressing and gently wash the site with plain soap and water. ? Pat the area dry with a clean towel. ? Do not rub the site. That could cause bleeding.  Do not apply powder or lotion to the site. Activity   For 24 hours after the procedure, or as directed by your health care provider: ? Do not flex or bend the affected arm. ? Do not push or pull heavy objects with the affected arm. ? Do not  drive yourself home from the hospital or clinic. You may drive 24 hours after the procedure unless your health care provider tells you not to. ? Do not operate machinery or power tools.  Do not lift anything that is heavier than 10 lb (4.5 kg), or the limit that you are told, until your health care provider says that it is safe.  Ask your health care provider when it is okay to: ? Return to work or school. ? Resume usual physical activities or sports. ? Resume sexual activity. General instructions  If the catheter site starts to bleed, raise your arm and put firm pressure on the site. If the bleeding does not stop, get help right away. This is a medical emergency.  If you went home on the same day as your procedure, a responsible adult should be with you for the first 24 hours after you arrive home.  Keep all follow-up visits as told by your health care provider. This is important. Contact a health care provider if:  You have a fever.  You have redness, swelling, or yellow drainage around your insertion site. Get help right away if:  You have unusual pain at the radial site.  The catheter insertion area swells very fast.  The insertion area is bleeding, and the bleeding does not stop when you hold steady pressure on the area.  Your arm or hand becomes pale, cool, tingly, or numb. These symptoms may represent a serious   problem that is an emergency. Do not wait to see if the symptoms will go away. Get medical help right away. Call your local emergency services (911 in the [place] Do not drive yourself to the hospital. Summary  After the procedure, it is common to have bruising and tenderness at the site.  Follow instructions from your health care provider about how to take care of your radial site wound. Check the wound every day for signs of infection.  Do not lift anything that is heavier than 10 lb (4.5 kg), or the limit that you are told, until your health care provider says  that it is safe. This information is not intended to replace advice given to you by your health care provider. Make sure you discuss any questions you have with your health care provider. Document Revised: 02/14/2017 Document Reviewed: 02/14/2017 Elsevier Patient Education  2020 Elsevier Inc.        Moderate Conscious Sedation, Adult, Care After These instructions provide you with information about caring for yourself after your procedure. Your health care provider may also give you more specific instructions. Your treatment has been planned according to current medical practices, but problems sometimes occur. Call your health care provider if you have any problems or questions after your procedure. What can I expect after the procedure? After your procedure, it is common:  To feel sleepy for several hours.  To feel clumsy and have poor balance for several hours.  To have poor judgment for several hours.  To vomit if you eat too soon. Follow these instructions at home: For at least 24 hours after the procedure:   Do not: ? Participate in activities where you could fall or become injured. ? Drive. ? Use heavy machinery. ? Drink alcohol. ? Take sleeping pills or medicines that cause drowsiness. ? Make important decisions or sign legal documents. ? Take care of children on your own.  Rest. Eating and drinking  Follow the diet recommended by your health care provider.  If you vomit: ? Drink water, juice, or soup when you can drink without vomiting. ? Make sure you have little or no nausea before eating solid foods. General instructions  Have a responsible adult stay with you until you are awake and alert.  Take over-the-counter and prescription medicines only as told by your health care provider.  If you smoke, do not smoke without supervision.  Keep all follow-up visits as told by your health care provider. This is important. Contact a health care provider  if:  You keep feeling nauseous or you keep vomiting.  You feel light-headed.  You develop a rash.  You have a fever. Get help right away if:  You have trouble breathing. This information is not intended to replace advice given to you by your health care provider. Make sure you discuss any questions you have with your health care provider. Document Revised: 12/22/2016 Document Reviewed: 05/01/2015 Elsevier Patient Education  2020 Elsevier Inc.  

## 2019-06-27 NOTE — Progress Notes (Signed)
Discharge instructions provided to the patient and the patients sister Jenny Reichmann. No questions at this time.

## 2019-06-27 NOTE — Brief Op Note (Signed)
BRIEF CARDIAC CATHETERIZATION NOTE  06/27/2019  11:48 AM  PATIENT:  Stacey Roberts  54 y.o. female  PRE-OPERATIVE DIAGNOSIS:  chest pain  POST-OPERATIVE DIAGNOSIS:  * No post-op diagnosis entered *  PROCEDURE:  Procedure(s): LEFT HEART CATH AND CORONARY ANGIOGRAPHY (N/A)  SURGEON:  Surgeon(s) and Role:    Nelva Bush, MD - Primary  FINDINGS: 1. Significant single-vessel coronary artery disease with diffuse calcified plaquing of the mid RCA and up to 80% stenosis. 2. Mild to moderate, non-obstructive CAD involving the LAD and LCx. 3. Mildly elevated LVEDP.  RECOMMENDATIONS: 1. Optimize medical therapy, including increasing metoprolol tartrate to 50 mg BID. 2. Continue aggressive lipid and blood sugar control to prevent progression of disease. 3. If the patient has refractory symptoms despite maximal tolerated doses of at least 2 antianginal agents, PCI to the RCA could be considered.  Nelva Bush, MD [facility]

## 2019-06-30 ENCOUNTER — Other Ambulatory Visit: Payer: No Typology Code available for payment source

## 2019-07-08 ENCOUNTER — Ambulatory Visit
Admission: RE | Admit: 2019-07-08 | Discharge: 2019-07-08 | Disposition: A | Discharge status: Home / Self Care | Payer: No Typology Code available for payment source | Source: Ambulatory Visit | Attending: Internal Medicine | Admitting: Internal Medicine

## 2019-07-08 ENCOUNTER — Other Ambulatory Visit: Payer: Self-pay

## 2019-07-08 ENCOUNTER — Other Ambulatory Visit: Payer: Self-pay | Admitting: Internal Medicine

## 2019-07-08 DIAGNOSIS — R234 Changes in skin texture: Secondary | ICD-10-CM

## 2019-07-08 DIAGNOSIS — R921 Mammographic calcification found on diagnostic imaging of breast: Secondary | ICD-10-CM

## 2019-07-15 ENCOUNTER — Other Ambulatory Visit: Payer: No Typology Code available for payment source

## 2019-07-21 ENCOUNTER — Encounter: Payer: Self-pay | Admitting: Endocrinology

## 2019-07-21 ENCOUNTER — Other Ambulatory Visit: Payer: Self-pay

## 2019-07-21 ENCOUNTER — Ambulatory Visit: Payer: No Typology Code available for payment source | Admitting: Endocrinology

## 2019-07-21 ENCOUNTER — Other Ambulatory Visit: Payer: Self-pay | Admitting: Endocrinology

## 2019-07-21 ENCOUNTER — Other Ambulatory Visit: Payer: No Typology Code available for payment source

## 2019-07-21 VITALS — BP 126/80 | HR 91 | Ht 64.0 in | Wt 235.2 lb

## 2019-07-21 DIAGNOSIS — E1142 Type 2 diabetes mellitus with diabetic polyneuropathy: Secondary | ICD-10-CM

## 2019-07-21 DIAGNOSIS — Z794 Long term (current) use of insulin: Secondary | ICD-10-CM | POA: Diagnosis not present

## 2019-07-21 LAB — POCT GLYCOSYLATED HEMOGLOBIN (HGB A1C): Hemoglobin A1C: 9.8 % — AB (ref 4.0–5.6)

## 2019-07-21 MED ORDER — OZEMPIC (0.25 OR 0.5 MG/DOSE) 2 MG/1.5ML ~~LOC~~ SOPN: 0.2500 mg | PEN_INJECTOR | SUBCUTANEOUS | 11 refills | Status: DC

## 2019-07-21 MED ORDER — BASAGLAR KWIKPEN 100 UNIT/ML ~~LOC~~ SOPN: 50.0000 [IU] | PEN_INJECTOR | SUBCUTANEOUS | 3 refills | Status: DC

## 2019-07-21 MED ORDER — METFORMIN HCL ER 500 MG PO TB24: 2000.0000 mg | ORAL_TABLET | Freq: Every day | ORAL | 3 refills | Status: DC

## 2019-07-21 NOTE — Patient Instructions (Addendum)
I have sent a prescription to your pharmacy, for Ozempic Please continue the same other medications.  Blood tests are requested for you today.  Please have drawn the next time you are having blood taken.  We'll let you know about the results.  check your blood sugar twice a day.  vary the time of day when you check, between before the 3 meals, and at bedtime.  also check if you have symptoms of your blood sugar being too high or too low.  please keep a record of the readings and bring it to your next appointment here (or you can bring the meter itself).  You can write it on any piece of paper.  please call us sooner if your blood sugar goes below 70, or if you have a lot of readings over 200.  Please come back for a follow-up appointment in 2 months.

## 2019-07-21 NOTE — Progress Notes (Signed)
Subjective:    Patient ID: Stacey Roberts, female    DOB: Mar 09, 1965, 54 y.o.   MRN: [id number]  HPI Pt has stage-1 papillary adenocarcinoma of the thyroid.    2/11: thyroidectomy: T1b N0 M0.  4/11: RAI 103 mCi, with thyrogen.  12/11: neck US: single enlarged right cervical lymph node, nonspecific.   6/12: body scan (thyrogen) neg.   12/12: [place]: lymph node is stable.  6/13: [place]: overall node morphology and volume show very little change.   9/14  TG undetectable (ab neg) 3/15: TG undetectable (ab neg) 10/15 TG undetectable (ab neg).  3/17 TG undetectable (ab neg).   4/19 TG undetectable (ab neg).   6/20 [place] no tumor 2/20 TG undetectable (ab neg).   3/21 TG undetectable (ab neg).   Goal TSH is normal, due to loig disease-free interval.  Denies neck swelling.  Pt returns for f/u of diabetes mellitus:  DM type: Insulin-requiring type 2 Dx'ed: 0017 Complications: PPN and CAD.  Therapy: insulin since early 2017 and 2 oral meds.   GDM: never.  DKA: never.   Severe hypoglycemia: never.   Pancreatitis: never.  Other: she declines multiple daily injections; edema precudes pioglitizone rx.  She declines bariatric surgery for now.  She declines to add bromocriptine; she did not tolerate Trulicity (nausea). Interval history: She never misses meds.  no cbg record, but states cbg varies from 170-200's.  She wants to re-try GLP med.   Past Medical History:  Diagnosis Date  . Allergy    seasonal  . BRCA negative 2017  . Breast cancer ([place]) 2014  . Breast cancer ([place]) 2017  . Bronchitis    hx of  . Diabetes mellitus, type II ([place])   . Endometrial hyperplasia 2014   Mirena placed; Dr. Carren Rang  . GERD (gastroesophageal reflux disease)   . Headache   . History of radiation therapy 12/01/15-01/11/16   Left breast DIBH / 50.4 Gy in 28 fractions and Left breast boost / 12 Gy in 6 fractions  . Hyperlipidemia   . Hypertension   . Hypothyroidism   . Joint pain   . Obesity   . PCOS (polycystic  ovarian syndrome)   . Personal history of radiation therapy 2017  . Pneumonia    as a child  . Sleep apnea 2008   severe OSA-does not use a cpap  . Snoring   . 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 Buddy Duty    Past Surgical History:  Procedure Laterality Date  . BREAST BIOPSY  2000   s/p  . BREAST BIOPSY Left 01/22/2013   Procedure: RE-EXCISION LEFT BREAST DCIS;  Surgeon: Harl Bowie, MD;  Location: [facility];  Service: General;  Laterality: Left;  . BREAST LUMPECTOMY Left 01/06/2013   Procedure: LUMPECTOMY;  Surgeon: Harl Bowie, MD;  Location: [facility];  Service: General;  Laterality: Left;  . BREAST LUMPECTOMY Left 2017  . BREAST LUMPECTOMY WITH NEEDLE LOCALIZATION AND AXILLARY SENTINEL LYMPH NODE BX Left 09/30/2015   Procedure: Left BREAST LUMPECTOMY WITH NEEDLE LOCALIZATION AND AXILLARY SENTINEL LYMPH NODE BX;  Surgeon: Coralie Keens, MD;  Location: [facility];  Service: General;  Laterality: Left;  . BREAST RECONSTRUCTION Bilateral 10/12/2015   Procedure: BREAST oncoplastic RECONSTRUCTION;  Surgeon: Irene Limbo, MD;  Location: [facility];  Service: Plastics;  Laterality: Bilateral;  . BREAST REDUCTION SURGERY Bilateral 10/12/2015   Procedure:  MAMMARY REDUCTION  (BREAST);  Surgeon: Irene Limbo, MD;  Location: [facility];  Service: Plastics;  Laterality: Bilateral;  . DILATION AND CURETTAGE OF UTERUS  2004   s/p  . EXCISION OF BREAST LESION Left 10/12/2015   Procedure: Re-EXCISION OF BREAST;  Surgeon: Coralie Keens, MD;  Location: [facility];  Service: General;  Laterality: Left;  . LEFT HEART CATH AND CORONARY ANGIOGRAPHY N/A 06/27/2019   Procedure: LEFT HEART CATH AND CORONARY ANGIOGRAPHY;  Surgeon: Nelva Bush, MD;  Location: [facility];  Service: Cardiovascular;  Laterality: N/A;  .  REDUCTION MAMMAPLASTY Bilateral 09/2015  . THYROIDECTOMY  02/2009   Dr Harlow Asa  . TONSILLECTOMY  1982    Social History   Socioeconomic History  . Marital status: Single    Spouse name: Not on file  . Number of children: Not on file  . Years of education: Not on file  . Highest education level: Not on file  Occupational History    Employer: [employer]    Comment: Outpatient scheduling in radiology  Tobacco Use  . Smoking status: Former Smoker    Packs/day: 0.50    Types: Cigarettes    Quit date: 09/29/2007    Years since quitting: 11.8  . Smokeless tobacco: Never Used  . Tobacco comment: Smoked on and off for 20 years. Quit about 8 years ago (as of 08/2015)  Vaping Use  . Vaping Use: Never used  Substance and Sexual Activity  . Alcohol use: Not Currently    Alcohol/week: 0.0 standard drinks  . Drug use: No  . Sexual activity: Not Currently    Birth control/protection: I.U.D.  Other Topics Concern  . Not on file  Social History Narrative   The patient is divorced, moved to [place] in   2006 from [place].  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    No alcohol.  Tobacco use:  She quit 5 years ago.  She smoked on   and off for approximately 20 years.  No history of recreational drug   Use.Drinks two cups of coffee per work day.    Social Determinants of Health   Financial Resource Strain:   . Difficulty of Paying Living Expenses:   Food Insecurity:   . Worried About Charity fundraiser in the Last Year:   . Arboriculturist in the Last Year:   Transportation Needs:   . Film/video editor (Medical):   Marland Kitchen Lack of Transportation (Non-Medical):   Physical Activity:   . Days of Exercise per Week:   . Minutes of Exercise per Session:   Stress:   . Feeling of Stress :   Social Connections:   . Frequency of Communication with Friends and Family:   . Frequency of Social  Gatherings with Friends and Family:   . Attends Religious Services:   . Active Member of Clubs or Organizations:   . Attends Archivist Meetings:   Marland Kitchen Marital Status:   Intimate Partner Violence:   . Fear of Current or Ex-Partner:   . Emotionally Abused:   Marland Kitchen Physically Abused:   . Sexually Abused:     Current Outpatient Medications on File Prior to Visit  Medication Sig Dispense Refill  . aspirin EC 81 MG tablet Take 1 tablet (81 mg total) by mouth daily. 90 tablet 3  . Blood Glucose Monitoring  Suppl (FREESTYLE LITE) DEVI 1 each by Does not apply route 2 (two) times daily. [name] 1 each 0  . calcium carbonate (OSCAL) 1500 (600 Ca) MG TABS tablet Take 600 mg of elemental calcium by mouth daily with breakfast.     . dapagliflozin propanediol (FARXIGA) 10 MG TABS tablet Take 10 mg by mouth daily. 90 tablet 3  . gabapentin (NEURONTIN) 300 MG capsule Take 2 capsules (600 mg total) by mouth at bedtime. 180 capsule 4  . glucose blood (FREESTYLE LITE) test strip 1 each by Other route 2 (two) times daily. [name] 200 each 0  . Lancets (FREESTYLE) lancets 1 each by Other route 2 (two) times daily. [name] 200 each 0  . levonorgestrel (MIRENA) 20 MCG/24HR IUD 1 each by Intrauterine route once.    Marland Kitchen levothyroxine (SYNTHROID) 137 MCG tablet Take 1 tablet (137 mcg total) by mouth daily before breakfast. 90 tablet 3  . losartan (COZAAR) 50 MG tablet Take 1 tablet (50 mg total) by mouth daily. 90 tablet 1  . metoprolol tartrate (LOPRESSOR) 25 MG tablet Take 2 tablets (50 mg total) by mouth 2 (two) times daily. 180 tablet 3  . Multiple Vitamins-Minerals (MULTIVITAMIN WITH MINERALS) tablet Take 1 tablet by mouth daily. Woman's Gummies    . nitroGLYCERIN (NITROSTAT) 0.4 MG SL tablet Place 1 tablet (0.4 mg total) under the tongue every 5 (five) minutes as needed for chest pain. 25 tablet prn  . pantoprazole (PROTONIX) 40 MG tablet TAKE 1 TABLET (40 MG TOTAL) BY MOUTH DAILY. MUST SCHEDULE PHYSICAL EXAM  (Patient taking differently: Take 40 mg by mouth daily. ) 90 tablet 3  . pregabalin (LYRICA) 75 MG capsule Take 1 capsule (75 mg total) by mouth 2 (two) times daily. 180 capsule 3  . rosuvastatin (CRESTOR) 20 MG tablet Take 1 tablet (20 mg total) by mouth daily. 90 tablet 3  . tamoxifen (NOLVADEX) 20 MG tablet Take 1 tablet (20 mg total) by mouth daily. 90 tablet 3   Current Facility-Administered Medications on File Prior to Visit  Medication Dose Route Frequency Provider Last Rate Last Admin  . sodium chloride flush (NS) 0.9 % injection 3 mL  3 mL Intravenous Q12H Visser, Jacquelyn D, PA-C        Allergies  Allergen Reactions  . Penicillins Shortness Of Breath, Rash and Other (See Comments)    Has patient had a PCN reaction causing immediate rash, facial/tongue/throat swelling, SOB or lightheadedness with hypotension: Yes Has patient had a PCN reaction causing severe rash involving mucus membranes or skin necrosis: Yes Has patient had a PCN reaction that required hospitalization No Has patient had a PCN reaction occurring within the last 10 years: Yes If all of the above answers are "NO", then may proceed with Cephalosporin use.   . Clindamycin/Lincomycin Other (See Comments)    Severe stomach cramps  . Ace Inhibitors Other (See Comments) and Cough    REACTION: cough; denies airway involvement     Family History  Problem Relation Age of Onset  . Dementia Father        deceased age 46 secondary to dementia  . Bipolar disorder Father   . Emphysema Father   . Heart disease Father   . COPD Father        smoker  . Hypertension Father   . Depression Father   . Alcoholism Father   . CAD Mother 29       Died age 34 of CAD  . Heart disease Mother  CABG x 2  . Graves' disease Mother   . Hypertension Mother   . Thyroid disease Mother   . Heart attack Mother 65  . CAD Maternal Grandfather 60  . Heart disease Maternal Grandfather   . Heart attack Maternal Grandfather 64  .  Other Sister 51       hysterectomy for fibroids  . Prostate cancer Brother        low grade; w/ surveillance  . Thyroid nodules Brother 55  . Heart disease Brother 47       CABG x 4  . Heart disease Sister        CABG x 2  . Heart attack Sister 78  . Fibroids Other        niece dx approx 37  . Hypertension Other   . Kidney cancer Cousin        maternal 1st cousin dx 70-47; former smoker  . Lung cancer Other        maternal great aunt (MGM's sister); not a smoker  . Cancer Other        nephew dx neuroblastoma at 17.69 years old  . Colon cancer Neg Hx   . Colon polyps Neg Hx     BP 126/80   Pulse 91   Ht _0  (1.626 m)   Wt 235 lb 3.2 oz (106.7 kg)   SpO2 97%   BMI 40.37 kg/m    Review of Systems She denies hypoglycemia.      Objective:   Physical Exam VITAL SIGNS:  See vs page GENERAL: no distress Pulses: dorsalis pedis intact bilat.   MSK: no deformity of the feet CV: no leg edema Skin:  no ulcer on the feet.  normal color and temp on the feet. Neuro: sensation is intact to touch on the feet.     Lab Results  Component Value Date   HGBA1C 9.8 (A) 07/21/2019   Lab Results  Component Value Date   CREATININE 0.66 06/16/2019   BUN 17 06/16/2019   NA 142 06/16/2019   K 4.5 06/16/2019   CL 103 06/16/2019   CO2 26 06/16/2019       Assessment & Plan:  Insulin-requiring type 2 DM, with CAD: worse.  Nausea: we discussed.  She wants to try another GLP med.  Patient Instructions  I have sent a prescription to your pharmacy, for Ozempic Please continue the same other medications.  Blood tests are requested for you today.  Please have drawn the next time you are having blood taken.  We'll let you know about the results.  check your blood sugar twice a day.  vary the time of day when you check, between before the 3 meals, and at bedtime.  also check if you have symptoms of your blood sugar being too high or too low.  please keep a record of the readings and bring  it to your next appointment here (or you can bring the meter itself).  You can write it on any piece of paper.  please call us sooner if your blood sugar goes below 70, or if you have a lot of readings over 200.  Please come back for a follow-up appointment in 2 months.

## 2019-07-25 ENCOUNTER — Other Ambulatory Visit: Payer: Self-pay | Admitting: Family

## 2019-07-25 ENCOUNTER — Ambulatory Visit (INDEPENDENT_AMBULATORY_CARE_PROVIDER_SITE_OTHER): Payer: No Typology Code available for payment source | Admitting: Family

## 2019-07-25 ENCOUNTER — Other Ambulatory Visit: Payer: Self-pay

## 2019-07-25 ENCOUNTER — Encounter: Payer: Self-pay | Admitting: Family

## 2019-07-25 VITALS — BP 108/78 | HR 87 | Ht 64.0 in | Wt 231.4 lb

## 2019-07-25 DIAGNOSIS — E785 Hyperlipidemia, unspecified: Secondary | ICD-10-CM

## 2019-07-25 DIAGNOSIS — I25118 Atherosclerotic heart disease of native coronary artery with other forms of angina pectoris: Secondary | ICD-10-CM

## 2019-07-25 DIAGNOSIS — R002 Palpitations: Secondary | ICD-10-CM

## 2019-07-25 DIAGNOSIS — I10 Essential (primary) hypertension: Secondary | ICD-10-CM

## 2019-07-25 DIAGNOSIS — E1165 Type 2 diabetes mellitus with hyperglycemia: Secondary | ICD-10-CM

## 2019-07-25 MED ORDER — METOPROLOL TARTRATE 50 MG PO TABS: 50.0000 mg | ORAL_TABLET | Freq: Two times a day (BID) | ORAL | 1 refills | Status: DC

## 2019-07-25 NOTE — Patient Instructions (Addendum)
Medication Instructions:  Your physician recommends that you continue on your current medications as directed. Please refer to the Current Medication list given to you today.  *If you need a refill on your cardiac medications before your next appointment, please call your pharmacy*   Lab Work: None ordered If you have labs (blood work) drawn today and your tests are completely normal, you will receive your results only by: Marland Kitchen MyChart Message (if you have MyChart) OR . A paper copy in the mail If you have any lab test that is abnormal or we need to change your treatment, we will call you to review the results.   Testing/Procedures: None ordered   Follow-Up: At [facility], you and your health needs are our priority.  As part of our continuing mission to provide you with exceptional heart care, we have created designated Provider Care Teams.  These Care Teams include your primary Cardiologist (physician) and Advanced Practice Providers (APPs -  Physician Assistants and Nurse Practitioners) who all work together to provide you with the care you need, when you need it.  We recommend signing up for the patient portal called "MyChart".  Sign up information is provided on this After Visit Summary.  MyChart is used to connect with patients for Virtual Visits (Telemedicine).  Patients are able to view lab/test results, encounter notes, upcoming appointments, etc.  Non-urgent messages can be sent to your provider as well.   To learn more about what you can do with MyChart, go to [email or web address].    Your next appointment:  3 months

## 2019-07-25 NOTE — Progress Notes (Signed)
Office Visit    Patient Name: Stacey Roberts Date of Encounter: 07/25/2019  Primary Care Provider:  Jearld Fenton, NP Primary Cardiologist:  Nelva Bush, MD Electrophysiologist:  None   Chief Complaint    Stacey Roberts is a 54 y.o. female with a hx of coronary artery disease, DM2, HTN, HLD, former tobacco use (quit 2017), breast cancer presents today for follow-up after cardiac catheterization  Past Medical History    Past Medical History:  Diagnosis Date  . Allergy    seasonal  . BRCA negative 2017  . Breast cancer ([place]) 2014  . Breast cancer ([place]) 2017  . Bronchitis    hx of  . Diabetes mellitus, type II ([place])   . Endometrial hyperplasia 2014   Mirena placed; Dr. Carren Rang  . GERD (gastroesophageal reflux disease)   . Headache   . History of radiation therapy 12/01/15-01/11/16   Left breast DIBH / 50.4 Gy in 28 fractions and Left breast boost / 12 Gy in 6 fractions  . Hyperlipidemia   . Hypertension   . Hypothyroidism   . Joint pain   . Obesity   . PCOS (polycystic ovarian syndrome)   . Personal history of radiation therapy 2017  . Pneumonia    as a child  . Sleep apnea 2008   severe OSA-does not use a cpap  . Snoring   . 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 Buddy Duty   Past Surgical History:  Procedure Laterality Date  . BREAST BIOPSY  2000   s/p  . BREAST BIOPSY Left 01/22/2013   Procedure: RE-EXCISION LEFT BREAST DCIS;  Surgeon: Harl Bowie, MD;  Location: [facility];  Service: General;  Laterality: Left;  . BREAST LUMPECTOMY Left 01/06/2013   Procedure: LUMPECTOMY;  Surgeon: Harl Bowie, MD;  Location: [facility];  Service: General;  Laterality: Left;  . BREAST LUMPECTOMY Left 2017  . BREAST LUMPECTOMY WITH NEEDLE LOCALIZATION AND AXILLARY SENTINEL LYMPH NODE BX Left 09/30/2015   Procedure: Left BREAST LUMPECTOMY WITH  NEEDLE LOCALIZATION AND AXILLARY SENTINEL LYMPH NODE BX;  Surgeon: Coralie Keens, MD;  Location: [facility];  Service: General;  Laterality: Left;  . BREAST RECONSTRUCTION Bilateral 10/12/2015   Procedure: BREAST oncoplastic RECONSTRUCTION;  Surgeon: Irene Limbo, MD;  Location: [facility];  Service: Plastics;  Laterality: Bilateral;  . BREAST REDUCTION SURGERY Bilateral 10/12/2015   Procedure: MAMMARY REDUCTION  (BREAST);  Surgeon: Irene Limbo, MD;  Location: [facility];  Service: Plastics;  Laterality: Bilateral;  . DILATION AND CURETTAGE OF UTERUS  2004   s/p  . EXCISION OF BREAST LESION Left 10/12/2015   Procedure: Re-EXCISION OF BREAST;  Surgeon: Coralie Keens, MD;  Location: [facility];  Service: General;  Laterality: Left;  . LEFT HEART CATH AND CORONARY ANGIOGRAPHY N/A 06/27/2019   Procedure: LEFT HEART CATH AND CORONARY ANGIOGRAPHY;  Surgeon: Nelva Bush, MD;  Location: [facility];  Service: Cardiovascular;  Laterality: N/A;  . REDUCTION MAMMAPLASTY Bilateral 09/2015  . THYROIDECTOMY  02/2009   Dr Harlow Asa  . TONSILLECTOMY  1982    Allergies  Allergies  Allergen Reactions  . Penicillins Shortness Of Breath, Rash and Other (See Comments)    Has patient had a PCN reaction causing immediate rash, facial/tongue/throat swelling, SOB or lightheadedness with hypotension: Yes Has patient had a PCN reaction causing severe rash involving mucus  membranes or skin necrosis: Yes Has patient had a PCN reaction that required hospitalization No Has patient had a PCN reaction occurring within the last 10 years: Yes If all of the above answers are "NO", then may proceed with Cephalosporin use.   . Clindamycin/Lincomycin Other (See Comments)    Severe stomach cramps  . Ace Inhibitors Other (See Comments) and Cough    REACTION: cough; denies airway involvement     History of Present Illness    Stacey Roberts is a 54 y.o. female  with a hx of coronary artery disease, DM2, HTN, HLD, former tobacco use (quit 2017), breast cancer last seen for cardiac catheterization 06/27/2019.  Strong family history of coronary artery disease.  She had a stress test many years ago which was unrevealing per her report.  Seen in 2014 by Dr. Percival Spanish for evaluation of palpitations with notation of atrial bigeminy.  Echo at that time with mild LVH and otherwise unremarkable.  Presented to [facility] ED April 2021 with chest pain palpitations.  ED work-up unrevealing.  She presented for clinic follow-up for chest pain and palpitations.  She was started on aspirin 81 mg daily and metoprolol tartrate 25 mg twice daily continue losartan.  She was recommended for echo and cardiac CTA.  Echo 05/29/2019 LVEF 60 to 65% with no regional wall motion abnormalities and trivial AI.  She had cardiac CTA with calcium score of 1373 placing her in the 99th percentile for age and sex.  Her FFR showed significant stenosis of the LCx, OM1, RCA she was recommended for cardiac catheterization.  LHC 06/27/2019 showing significant single-vessel CAD with calcified stenosis up to 80% involving mid portion of small RCA with mild nonobstructive disease of LAD and LCx.  Mildly elevated filling pressures.  Recommended for optimizing medical therapy, metoprolol increased to 50 mg twice daily.  Noted that if she had refractory symptoms despite maximal tolerated just at least 2 antianginal agents PCI to the RCA could be considered.  She reports today feeling overall well.  Reports no chest pain, pressure, tightness since cardiac catheterization.  We reviewed her testing results in detail.  We reviewed guideline directed therapy for coronary disease.  She does report one episode of palpitations and sensation of lightheadedness the day after her catheterization.  She attributes this to being dehydrated and she has multiple cups of coffee nothing to drink.  She reports her palpitations have been  well controlled with increased dose of metoprolol.  We discussed the possibility of ZIO monitoring as well as Imdur.  She is not having chest pain reports her palpitations are well controlled we came to the agreed upon decision to defer additional intervention at this time.  She plans to start a walking regimen for exercise.  EKGs/Labs/Other Studies Reviewed:   The following studies were reviewed today: Echo 05/29/19  1. Left ventricular ejection fraction, by estimation, is 60 to 65%. The  left ventricle has normal function. The left ventricle has no regional  wall motion abnormalities. Left ventricular diastolic parameters were  normal.   2. Right ventricular systolic function is normal. The right ventricular  size is normal.   3. The mitral valve is grossly normal. No evidence of mitral valve  regurgitation.   4. The aortic valve is tricuspid. Aortic valve regurgitation is trivial.  No aortic stenosis is present.   5. The inferior vena cava is normal in size with greater than 50%  respiratory variability, suggesting right atrial pressure of 3 mmHg.  Cardiac CTA and FFRct 06/12/19 ADDENDUM REPORT: 06/14/2019 15:35 EXAM: CT FFR ANALYSIS CLINICAL DATA:  20F with abnormal coronary CT-A. FINDINGS: Normal FFR range is >0.80. 1. Left Main:  No significant stenosis.  FFRct 0.99. 2. LAD: No significant stenosis. FFRct 0.96 proximal, 0.83 mid, 0.73 distal. 3. LCX: Significant stenosis. FFRct 0.94 proximal, 0.64 distal (abnormal). OM1 0.66 (abnormal). 4. RCA: Significant stenosis. FFRct 0.99 proximal, 0.72 mid, 0.59 distal. IMPRESSION: 1.  CT FFR indicates significant stenosis in the LCX, OM1, and RCA. 2.  LAD FFRct indeterminate. IMPRESSION: Aorta: Normal size. Ascending aorta 3.4 cm. Mild calcification of the aortic root. No dissection. Aortic Valve:  Trileaflet.  No calcifications. Coronary Arteries:  Normal coronary origin.  Right dominance.  RCA is a large dominant artery that  gives rise to PDA and PLVB. There is severe (>70%) calcified plaque in the mid RCA and severe low attenuation plaque in the distal RCA. Left main is a large artery that gives rise to LAD and LCX arteries. There is minimal (<25%) calcified plaque. LAD is a large vessel that is diffusely diseased and heavily calcified. There is moderate (50-69%) diffuse calcified plaque. There are two small diagonal vessels. LCX is a non-dominant artery that gives rise to one large OM1 branch. There is diffuse mild to moderate (<70%) mixed plaque. There is a large OM1 with severe (>70%) mixed plaque proximally and moderate (50-69%) mixed plaque in the mid to distal vessel. Other findings: Normal pulmonary vein drainage into the left atrium. Normal let atrial appendage without a thrombus. Normal size of the pulmonary artery. 1. Coronary calcium score of 1373. This was 99th percentile for age and sex matched control. 2. Normal coronary origin with right dominance. 3. Coronary arteries are diffusely diseased. There is severe (>70%) plaque in the RCA and OM1. CAD RADS 4. 4.  Will send study for FFRct.  LHC 06/27/19 Conclusions: 1. Significant single-vessel coronary artery disease with calcified stenosis of up to 80% involving mid portion of small RCA.  There is mild, non-obstructive disease involving the LAD and LCx. 2. Mildly elevated left ventricular filling pressure.   Recommendations: 1. Optimize medical therapy, including increasing metoprolol tartrate to 50 mg BID. 2. Continue aggressive lipid and blood sugar control to prevent progression of disease. 3. If the patient has refractory symptoms despite maximal tolerated doses of at least 2 antianginal agents, PCI to the RCA could be considered.  EKG:  EKG is ordered today.  The ekg ordered today demonstrates NSR 87 bpm with no acute ST/T wave changes.   Recent Labs: 11/18/2018: ALT 16 05/22/2019: TSH 0.60 06/16/2019: BUN 17; Creatinine, Ser 0.66;  Hemoglobin 13.3; Platelets 263; Potassium 4.5; Sodium 142  Recent Lipid Panel    Component Value Date/Time   CHOL 104 03/21/2019 0751   CHOL 122 03/21/2017 1018   TRIG 172.0 (H) 03/21/2019 0751   HDL 35.50 (L) 03/21/2019 0751   HDL 41 03/21/2017 1018   CHOLHDL 3 03/21/2019 0751   VLDL 34.4 03/21/2019 0751   LDLCALC 34 03/21/2019 0751   LDLCALC 63 03/21/2017 1018   LDLDIRECT 153.0 09/01/2014 1634    Home Medications   Current Meds  Medication Sig  . aspirin EC 81 MG tablet Take 1 tablet (81 mg total) by mouth daily.  . Blood Glucose Monitoring Suppl (FREESTYLE LITE) DEVI 1 each by Does not apply route 2 (two) times daily. [name]  . calcium carbonate (OSCAL) 1500 (600 Ca) MG TABS tablet Take 600 mg of elemental calcium by mouth daily  with breakfast.   . dapagliflozin propanediol (FARXIGA) 10 MG TABS tablet Take 10 mg by mouth daily.  Marland Kitchen gabapentin (NEURONTIN) 300 MG capsule Take 2 capsules (600 mg total) by mouth at bedtime.  Marland Kitchen glucose blood (FREESTYLE LITE) test strip 1 each by Other route 2 (two) times daily. [name]  . Insulin Glargine (BASAGLAR KWIKPEN) 100 UNIT/ML Inject 0.5 mLs (50 Units total) into the skin every morning.  . Lancets (FREESTYLE) lancets 1 each by Other route 2 (two) times daily. [name]  . levonorgestrel (MIRENA) 20 MCG/24HR IUD 1 each by Intrauterine route once.  Marland Kitchen levothyroxine (SYNTHROID) 137 MCG tablet Take 1 tablet (137 mcg total) by mouth daily before breakfast.  . losartan (COZAAR) 50 MG tablet Take 1 tablet (50 mg total) by mouth daily.  . metFORMIN (GLUCOPHAGE-XR) 500 MG 24 hr tablet Take 4 tablets (2,000 mg total) by mouth daily with breakfast.  . metoprolol tartrate (LOPRESSOR) 50 MG tablet Take 50 mg by mouth 2 (two) times daily.  . Multiple Vitamins-Minerals (MULTIVITAMIN WITH MINERALS) tablet Take 1 tablet by mouth daily. Woman's Gummies  . nitroGLYCERIN (NITROSTAT) 0.4 MG SL tablet Place 1 tablet (0.4 mg total) under the tongue every 5 (five) minutes as  needed for chest pain.  . pantoprazole (PROTONIX) 40 MG tablet TAKE 1 TABLET (40 MG TOTAL) BY MOUTH DAILY. MUST SCHEDULE PHYSICAL EXAM (Patient taking differently: Take 40 mg by mouth daily. )  . pregabalin (LYRICA) 75 MG capsule Take 1 capsule (75 mg total) by mouth 2 (two) times daily. (Patient taking differently: Take 75 mg by mouth daily. )  . rosuvastatin (CRESTOR) 20 MG tablet Take 1 tablet (20 mg total) by mouth daily.  . Semaglutide,0.25 or 0.5MG/DOS, (OZEMPIC, 0.25 OR 0.5 MG/DOSE,) 2 MG/1.5ML SOPN Inject 0.1875 mLs (0.25 mg total) into the skin once a week.  . tamoxifen (NOLVADEX) 20 MG tablet Take 1 tablet (20 mg total) by mouth daily.   Current Facility-Administered Medications for the 07/25/19 encounter (Office Visit) with Loel Dubonnet, NP  Medication  . sodium chloride flush (NS) 0.9 % injection 3 mL      Review of Systems   Review of Systems  Constitutional: Negative for chills, fever and malaise/fatigue.  Cardiovascular: Negative for chest pain, dyspnea on exertion, leg swelling, near-syncope, orthopnea, palpitations and syncope.  Respiratory: Negative for cough, shortness of breath and wheezing.   Gastrointestinal: Negative for nausea and vomiting.  Neurological: Negative for dizziness, light-headedness and weakness.   All other systems reviewed and are otherwise negative except as noted above.  Physical Exam    VS:  BP 108/78 (BP Location: Left Arm, Patient Position: Sitting, Cuff Size: Large)   Pulse 87   Ht _0  (1.626 m)   Wt 231 lb 6 oz (105 kg)   SpO2 97%   BMI 39.72 kg/m  , BMI Body mass index is 39.72 kg/m. GEN: Well nourished, well developed, in no acute distress. HEENT: normal. Neck: Supple, no JVD, carotid bruits, or masses. Cardiac: RRR, no murmurs, rubs, or gallops. No clubbing, cyanosis, edema.  Radials/DP/PT 2+ and equal bilaterally.  Respiratory:  Respirations regular and unlabored, clear to auscultation bilaterally. GI: Soft, nontender,  nondistended. MS: No deformity or atrophy. Skin: Warm and dry, no rash.  Right radial cath site clean, dry, intact with no erythema, ecchymosis, signs or symptoms of infection. Neuro:  Strength and sensation are intact. Psych: Normal affect.   Assessment & Plan    1. CAD -LHC 06/27/2018 with moderate disease to  the mid RCA and mild disease to LAD and LCx.  Recommended for medical therapy.  No recurrent chest pain since cardiac catheterization.  No noted refractory anginal symptoms.  Continue metoprolol 50 mg twice daily, aspirin 81 mg daily, Crestor daily, PRN nitroglycerin.  We discussed the possibility of adding Imdur but if she is without anginal symptoms she prefers to start a walking regimen to increase her exercise tolerance.  2. Palpitations -well-controlled on metoprolol 50 mg twice daily.  Refill provided.  Encouraged to avoid caffeine, remain well-hydrated, avoid proarrhythmic agents.  We discussed the possibility of ZIO monitoring but as her symptoms are well controlled we agreed to defer at this time.    3. HTN -BP well controlled.  Continue present antihypertensive regimen.  4. HLD, LDL goal <70 -03/21/2019 LDL 34.  Continue Crestor.  5. DM2 -recently started on Ozempic.  Continue to follow with endocrinology.  Disposition: Follow up in 3 month(s) with Dr. Saunders Revel or APP  Loel Dubonnet, NP 07/25/2019, 3:47 PM

## 2019-07-29 ENCOUNTER — Other Ambulatory Visit: Payer: Self-pay | Admitting: Endocrinology

## 2019-07-29 ENCOUNTER — Other Ambulatory Visit: Payer: Self-pay | Admitting: Internal Medicine

## 2019-08-25 ENCOUNTER — Encounter: Payer: Self-pay | Admitting: Internal Medicine

## 2019-08-25 DIAGNOSIS — E1142 Type 2 diabetes mellitus with diabetic polyneuropathy: Secondary | ICD-10-CM

## 2019-08-27 NOTE — Progress Notes (Signed)
[facility]  [phone] [phone]     ID: Stacey Roberts DOB: 25-Jul-1965  MR#: [id number]  UUV#:[id number]  Patient Care Team: Stacey Munroe, NP as PCP - General (Internal Medicine) Roberts, Stacey Deer, MD as PCP - Cardiology (Cardiology) Stacey Roberts, Stacey Hue, MD as Consulting Physician (Oncology) Stacey Miyamoto, MD as Consulting Physician (General Surgery) Stacey Rankins, MD as Consulting Physician (Obstetrics and Gynecology) Stacey Blackbird, MD as Consulting Physician (Radiation Oncology) Stacey Belling, MD as Consulting Physician (Endocrinology) Stacey Fredrickson, MD as Consulting Physician (Gastroenterology) Stacey Fellows, MD as Consulting Physician (Plastic Surgery) OTHER MD:   CHIEF COMPLAINT: Estrogen receptor positive breast cancer  CURRENT TREATMENT: Tamoxifen   INTERVAL HISTORY: Stacey Roberts returns today for follow-up of her estrogen receptor positive breast cancer.  She continues on tamoxifen. She does not have so much hot flashes as sweating in the back of her knees and the band of her elbows.  This is very occasional.  She has vaginal dryness, not vaginal wetness.  Since her last visit, she presented with inflamed, tender, and red lumpectomy site, as well as increased puckering of lumpectomy scar. She underwent left diagnostic mammogram and left breast ultrasound at [facility] on 07/08/2019 showing: breast density category B; no evidence for abscess, however a short course of antibiotics recommended to exclude mastitis; changes in left breast most characteristic of fat necrosis, no discrete solid lesion identified; calcifications along lower-inner portion of postoperative changes, further evaluation deferred until next exam given patient's tenderness.  She was treated she thinks with doxycycline.  She is scheduled for repeat/annual mammography on 09/10/2019.   REVIEW OF SYSTEMS: Stacey Roberts is very dissatisfied with her left breast  reconstruction.  It hurts both internally and superficially.  She can only wear her very light bra and even that is uncomfortable.  She does not like the very irregular scar (imaged below).  In addition of course there is some erythema and she is concerned regarding the possibility of local recurrence.  She had the Pfizer vaccine and tolerated it well.  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 She had left lumpectomy for that lesion 01/06/2013, showing a low-grade noninvasive ductal carcinoma which was estrogen receptor 99% and progesterone receptor 100% positive. Margins were focally positive and she had further excision 01/22/2013 which showed residual focal ductal carcinoma in situ 1 mm from the posterior margin. The patient refused adjuvant radiation and did not tolerate tamoxifen, which she took for approximately 2 months. She was last seen in our office in 2015.  Because of that history she receives diagnostic bilateral mammography yearly. Her 02/15/2015 study found the breast density to be category A. This study was negative. However in early August 2017 she developed a new left nipple discharge, which at times was bloody. Repeat left mammography with tomography and left breast ultrasonography 08/30/2015 now read the breast density to be category B. Aside some medial left breast masses and postlumpectomy findings, none of which showed any change since 2012, there were new pleomorphic calcifications posterior to the lumpectomy area 1.4 cm. On exam there was no palpable lump. Ultrasound showed scarring behind the nipple which was unchanged from prior, but no definite mass.  Biopsy of the left breast upper outer quadrant area of calcifications 09/02/2015 showed ([id number]) ductal carcinoma in situ, grade 1. Focal  invasion  could not be excluded. There was not enough tissue for a prognostic panel.  On 09/10/2015 the patient underwent bilateral breast MRI with and without contrast. This again found the breast density to be category B. There was an area of non-masslike enhancement involving the upper outer quadrant of the left breast from the nipple posteriorly extending approximately 10 cm. The right breast was unremarkable. There was no evidence of adenopathy. MRI guided biopsy of the upper-outer quadrant retroareolar area of the left breast 09/16/2015 showed fat necrosis but no evidence of malignancy ([id number]).  Stacey Roberts's subsequent history is as detailed below   PAST MEDICAL HISTORY: Past Medical History:  Diagnosis Date   Allergy    seasonal   BRCA negative 2017   Breast cancer (HCC) 2014   Breast cancer (HCC) 2017   Bronchitis    hx of   Diabetes mellitus, type II (HCC)    Endometrial hyperplasia 2014   Mirena placed; Dr. 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 Sharl Roberts    PAST SURGICAL HISTORY: Past Surgical History:  Procedure Laterality Date   BREAST BIOPSY  2000   s/p   BREAST BIOPSY Left 01/22/2013   Procedure: RE-EXCISION LEFT BREAST DCIS;  Surgeon: Stacey Rubenstein, MD;  Location: [facility];  Service: General;  Laterality: Left;   BREAST LUMPECTOMY Left 01/06/2013   Procedure: LUMPECTOMY;   Surgeon: Stacey Rubenstein, MD;  Location: [facility] OR;  Service: General;  Laterality: Left;   BREAST LUMPECTOMY Left 2017   BREAST LUMPECTOMY WITH NEEDLE LOCALIZATION AND AXILLARY SENTINEL LYMPH NODE BX Left 09/30/2015   Procedure: Left BREAST LUMPECTOMY WITH NEEDLE LOCALIZATION AND AXILLARY SENTINEL LYMPH NODE BX;  Surgeon: Stacey Miyamoto, MD;  Location: [facility] OR;  Service: General;  Laterality: Left;   BREAST RECONSTRUCTION Bilateral 10/12/2015   Procedure: BREAST oncoplastic RECONSTRUCTION;  Surgeon: Stacey Fellows, MD;  Location: [facility];  Service: Plastics;  Laterality: Bilateral;   BREAST REDUCTION SURGERY Bilateral 10/12/2015   Procedure: MAMMARY REDUCTION  (BREAST);  Surgeon: Stacey Fellows, MD;  Location: [facility];  Service: Plastics;  Laterality: Bilateral;   DILATION AND CURETTAGE OF UTERUS  2004   s/p   EXCISION OF BREAST LESION Left 10/12/2015   Procedure: Re-EXCISION OF BREAST;  Surgeon: Stacey Miyamoto, MD;  Location:  SURGERY CENTER;  Service: General;  Laterality: Left;   LEFT HEART CATH AND CORONARY ANGIOGRAPHY N/A 06/27/2019   Procedure: LEFT HEART CATH AND CORONARY ANGIOGRAPHY;  Surgeon: Yvonne Kendall, MD;  Location: [facility];  Service: Cardiovascular;  Laterality: N/A;   REDUCTION MAMMAPLASTY Bilateral 09/2015   THYROIDECTOMY  02/2009   Dr Gerrit Friends   TONSILLECTOMY  1982    FAMILY HISTORY Family History  Problem Relation Age of Onset   Dementia Father  deceased age 78 secondary to dementia   Bipolar disorder Father    Emphysema Father    Heart disease Father    COPD Father        smoker   Hypertension Father    Depression Father    Alcoholism Father    CAD Mother 24       Died age 11 of CAD   Heart disease Mother        CABG x 2   Graves' disease Mother    Hypertension Mother    Thyroid disease Mother    Heart attack Mother 30   CAD Maternal Grandfather 62   Heart disease  Maternal Grandfather    Heart attack Maternal Grandfather 68   Other Sister 54       hysterectomy for fibroids   Prostate cancer Brother        low grade; w/ surveillance   Thyroid nodules Brother 51   Heart disease Brother 76       CABG x 4   Heart disease Sister        CABG x 2   Heart attack Sister 53   Fibroids Other        niece dx approx 37   Hypertension Other    Kidney cancer Cousin        maternal 1st cousin dx 64-47; former smoker   Lung cancer Other        maternal great aunt (MGM's sister); not a smoker   Cancer Other        nephew dx neuroblastoma at 67.53 years old   Colon cancer Neg Hx    Colon polyps Neg Hx   The patient's father died at the age of 3 from myocardial infarction in the setting of dementia. The patient's mother died at the age of 67 from heart disease. Briunna has 2 brothers, 2 sisters. There is no history of breast ovarian or colon cancer in the family. Of course she has a history of thyroid cancer   GYNECOLOGIC HISTORY:  No LMP recorded. (Menstrual status: IUD). Menarche age 9, she is GX P0. She has a Mirena in place.   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 She is divorced. The patient is not a church attender.    ADVANCED DIRECTIVES: the patient has named her sister Aram Beecham as her healthcare power of attorney   HEALTH MAINTENANCE: Social History   Tobacco Use   Smoking status: Former Smoker    Packs/day: 0.50    Types: Cigarettes    Quit date: 09/29/2007    Years since quitting: 11.9   Smokeless tobacco: Never Used   Tobacco comment: Smoked on and off for 20 years. Quit about 8 years ago (as of 08/2015)  Vaping Use   Vaping Use: Never used  Substance Use Topics   Alcohol use: Not Currently    Alcohol/week: 0.0 standard drinks   Drug use: No     Colonoscopy:  PAP:  Bone density:   Allergies  Allergen Reactions   Penicillins Shortness Of Breath, Rash and Other (See  Comments)    Has patient had a PCN reaction causing immediate rash, facial/tongue/throat swelling, SOB or lightheadedness with hypotension: Yes Has patient had a PCN reaction causing severe rash involving mucus membranes or skin necrosis: Yes Has patient had a PCN reaction that required hospitalization No Has patient had a PCN reaction occurring within the last 10 years: Yes If all of the above  answers are "NO", then may proceed with Cephalosporin use.    Clindamycin/Lincomycin Other (See Comments)    Severe stomach cramps   Ace Inhibitors Other (See Comments) and Cough    REACTION: cough; denies airway involvement     Current Outpatient Medications  Medication Sig Dispense Refill   aspirin EC 81 MG tablet Take 1 tablet (81 mg total) by mouth daily. 90 tablet 3   Blood Glucose Monitoring Suppl (FREESTYLE LITE) DEVI 1 each by Does not apply route 2 (two) times daily. [name] 1 each 0   calcium carbonate (OSCAL) 1500 (600 Ca) MG TABS tablet Take 600 mg of elemental calcium by mouth daily with breakfast.      FARXIGA 10 MG TABS tablet TAKE 10 MG BY MOUTH DAILY. 90 tablet 2   gabapentin (NEURONTIN) 300 MG capsule Take 2 capsules (600 mg total) by mouth at bedtime. 180 capsule 4   glucose blood (FREESTYLE LITE) test strip 1 each by Other route 2 (two) times daily. [name] 200 each 0   Insulin Glargine (BASAGLAR KWIKPEN) 100 UNIT/ML Inject 0.5 mLs (50 Units total) into the skin every morning. 25 pen 3   Lancets (FREESTYLE) lancets 1 each by Other route 2 (two) times daily. [name] 200 each 0   levonorgestrel (MIRENA) 20 MCG/24HR IUD 1 each by Intrauterine route once.     levothyroxine (SYNTHROID) 137 MCG tablet Take 1 tablet (137 mcg total) by mouth daily before breakfast. 90 tablet 3   losartan (COZAAR) 50 MG tablet TAKE 1 TABLET (50 MG TOTAL) BY MOUTH DAILY. 90 tablet 0   metFORMIN (GLUCOPHAGE-XR) 500 MG 24 hr tablet Take 4 tablets (2,000 mg total) by mouth daily with breakfast. 360  tablet 3   metoprolol tartrate (LOPRESSOR) 50 MG tablet Take 1 tablet (50 mg total) by mouth 2 (two) times daily. 180 tablet 1   Multiple Vitamins-Minerals (MULTIVITAMIN WITH MINERALS) tablet Take 1 tablet by mouth daily. Woman's Gummies     nitroGLYCERIN (NITROSTAT) 0.4 MG SL tablet Place 1 tablet (0.4 mg total) under the tongue every 5 (five) minutes as needed for chest pain. 25 tablet prn   pantoprazole (PROTONIX) 40 MG tablet TAKE 1 TABLET (40 MG TOTAL) BY MOUTH DAILY. MUST SCHEDULE PHYSICAL EXAM (Patient taking differently: Take 40 mg by mouth daily. ) 90 tablet 3   pregabalin (LYRICA) 75 MG capsule Take 1 capsule (75 mg total) by mouth 2 (two) times daily. (Patient taking differently: Take 75 mg by mouth daily. ) 180 capsule 3   rosuvastatin (CRESTOR) 20 MG tablet Take 1 tablet (20 mg total) by mouth daily. 90 tablet 3   Semaglutide,0.25 or 0.5MG /DOS, (OZEMPIC, 0.25 OR 0.5 MG/DOSE,) 2 MG/1.5ML SOPN Inject 0.1875 mLs (0.25 mg total) into the skin once a week. 1 pen 11   tamoxifen (NOLVADEX) 20 MG tablet Take 1 tablet (20 mg total) by mouth daily. 90 tablet 3   No current facility-administered medications for this visit.    OBJECTIVE: white woman who appears stated age  Vitals:   08/28/19 0905  BP: (!) 143/80  Pulse: 90  Resp: 18  Temp: 98.3 F (36.8 C)  SpO2: 98%     Body mass index is 39.53 kg/m.    ECOG FS:1 - Symptomatic but completely ambulatory  Sclerae unicteric, EOMs intact Wearing a mask No cervical or supraclavicular adenopathy Lungs no rales or rhonchi Heart regular rate and rhythm Abd soft, nontender, positive bowel sounds MSK no focal spinal tenderness, no upper extremity lymphedema Neuro: nonfocal,  well oriented, appropriate affect Breasts: The right breast is unremarkable, status post reduction mammoplasty.  Left breast is status post lumpectomy and oncoplastic reconstruction.  It is imaged below.  I do not palpate a hard mass, although the area is  significantly scarred and exam is difficult.  Both axillae are benign.  Left breast 08/28/2019    LAB RESULTS:  CMP     Component Value Date/Time   NA 142 06/16/2019 1644   NA 138 03/21/2017 1018   NA 137 02/18/2013 1554   K 4.5 06/16/2019 1644   K 4.4 02/18/2013 1554   CL 103 06/16/2019 1644   CO2 26 06/16/2019 1644   CO2 25 02/18/2013 1554   GLUCOSE 229 (H) 06/16/2019 1644   GLUCOSE 300 (H) 02/18/2013 1554   BUN 17 06/16/2019 1644   BUN 16 03/21/2017 1018   BUN 14.2 02/18/2013 1554   CREATININE 0.66 06/16/2019 1644   CREATININE 0.67 07/16/2017 1442   CREATININE 0.8 02/18/2013 1554   CALCIUM 9.0 06/16/2019 1644   CALCIUM 9.2 02/18/2013 1554   PROT 6.9 11/18/2018 1537   PROT 6.5 03/21/2017 1018   PROT 7.2 02/18/2013 1554   ALBUMIN 4.0 11/18/2018 1537   ALBUMIN 3.7 03/21/2017 1018   ALBUMIN 3.6 02/18/2013 1554   AST 0 11/18/2018 1537   AST 12 07/16/2017 1442   AST 10 02/18/2013 1554   ALT 16 11/18/2018 1537   ALT 12 07/16/2017 1442   ALT 16 02/18/2013 1554   ALKPHOS 99 11/18/2018 1537   ALKPHOS 85 02/18/2013 1554   BILITOT 0.4 11/18/2018 1537   BILITOT 0.3 07/16/2017 1442   BILITOT 0.33 02/18/2013 1554   GFRNONAA >60 06/16/2019 1644   GFRNONAA >60 07/16/2017 1442   GFRAA >60 06/16/2019 1644   GFRAA >60 07/16/2017 1442    INo results found for: SPEP, UPEP  Lab Results  Component Value Date   WBC 7.8 08/28/2019   NEUTROABS 5.6 08/28/2019   HGB 13.6 08/28/2019   HCT 42.3 08/28/2019   MCV 86.3 08/28/2019   PLT 241 08/28/2019      Chemistry      Component Value Date/Time   NA 142 06/16/2019 1644   NA 138 03/21/2017 1018   NA 137 02/18/2013 1554   K 4.5 06/16/2019 1644   K 4.4 02/18/2013 1554   CL 103 06/16/2019 1644   CO2 26 06/16/2019 1644   CO2 25 02/18/2013 1554   BUN 17 06/16/2019 1644   BUN 16 03/21/2017 1018   BUN 14.2 02/18/2013 1554   CREATININE 0.66 06/16/2019 1644   CREATININE 0.67 07/16/2017 1442   CREATININE 0.8 02/18/2013 1554       Component Value Date/Time   CALCIUM 9.0 06/16/2019 1644   CALCIUM 9.2 02/18/2013 1554   ALKPHOS 99 11/18/2018 1537   ALKPHOS 85 02/18/2013 1554   AST 0 11/18/2018 1537   AST 12 07/16/2017 1442   AST 10 02/18/2013 1554   ALT 16 11/18/2018 1537   ALT 12 07/16/2017 1442   ALT 16 02/18/2013 1554   BILITOT 0.4 11/18/2018 1537   BILITOT 0.3 07/16/2017 1442   BILITOT 0.33 02/18/2013 1554       No results found for: LABCA2  No components found for: LABCA125  No results for input(s): INR in the last 168 hours.  Urinalysis    Component Value Date/Time   COLORURINE YELLOW 03/15/2009 0835   APPEARANCEUR CLOUDY (A) 03/15/2009 0835   LABSPEC 1.024 03/15/2009 0835   PHURINE 7.5 03/15/2009 4782  GLUCOSEU NEGATIVE 03/15/2009 0835   GLUCOSEU NEGATIVE 01/09/2008 1524   HGBUR NEGATIVE 03/15/2009 0835   BILIRUBINUR NEGATIVE 03/15/2009 0835   KETONESUR NEGATIVE 03/15/2009 0835   PROTEINUR NEGATIVE 03/15/2009 0835   UROBILINOGEN 1.0 03/15/2009 0835   NITRITE NEGATIVE 03/15/2009 0835   LEUKOCYTESUR TRACE (A) 03/15/2009 0835    STUDIES: No results found.   ELIGIBLE FOR AVAILABLE RESEARCH PROTOCOL: No   ASSESSMENT: 55 y.o. BRCA negative [place] woman   (1) status post central left breast lumpectomy 01/06/2013 for a 1.5 cm ductal carcinoma in situ, grade 1, estrogen receptor 99% positive, progesterone receptor 100% positive, with a focally positive margin  (2) status post additional surgery for margin clearance 01/22/2013 showed the posterior margin negative at 0.1 cm  (3) the patient decided against adjuvant radiation; she was supposed to have started tamoxifen May 2015,  (4) left breast upper outer quadrant biopsy 09/02/2015 finds ductal carcinoma in situ, grade 1, with not enough tissue for prognostic panel  (a) upper outer quadrant biopsy retroareolar biopsy 09/16/2015, found to fibroadipose tissue and fat necrosis but no evidence of malignancy  (5) genetics testing  09/21/2015 through the [facility] Common Hereditary Cancers Panel (Breast, Gyn, GI) performed by [facility] [place]) found no deleterious mutations in APC, ATM, AXIN2, BARD1, BMPR1A, BRCA1, BRCA2, BRIP1, CDH1, CDKN2A, CHEK2, DICER1, EPCAM, GREM1, KIT, MEN1, MLH1, MSH2, MSH6, MUTYH, NBN, NF1, PALB2, PDGFRA, PMS2, POLD1, POLE, PTEN, RAD50, RAD51C, RAD51D, SDHA, SDHB, SDHC, SDHD, SMAD4, SMARCA4, STK11, TP53, TSC1, TSC2, and VHL  (6) repeat left lumpectomy and sentinel node biopsyy 10/12/2015 documented a pT2  PN0, stage IIA  Invasive ductal carcinoma, grade 1, with a focally positive margin; estrogen and progesterone receptor positive, HER-2 nonamplified, with an MIB-1 of 3%.  (a)  Left breast reexcision with bilateral oncoplastic reconstruction 10/12/2015 achieved clear margins, with no evidence of malignancy  (7) Oncotype DX score of 3 predicts a risk of recurrence outside the breast in 10 years of 4% if the patient's only systemic therapy is tamoxifen for 5 years. It also predicts no benefit from chemotherapy.  (8) adjuvant radiation11/08/17-12/19/17 Site/dose:  1) Left breast DIBH / 50.4 Gy in 28 fractions                         2) Left breast boost / 12 Gy in 6 fractions  (8) tamoxifen started 03/06/2016  (a) Mirena IUD in place  (9) status post total thyroidectomy 03/18/2018 for a follicular variant of papillary thyroid carcinoma, measuring 1.7 cm, confined within thyroid parenchyma but with angiolymphatic invasion and capsular invasion identified, pT1b pNX  (a) note normal PTEN above   PLAN: [name] is now just about 4 years out from definitive surgery for her breast cancer with no evidence of disease recurrence.  This is very favorable.  I reassured her that what she is experiencing in the left breast is not likely to be cancer particularly given the recent mammogram and ultrasound.  She is already scheduled for repeat mammography later this month and I have added a repeat  left breast ultrasound to make sure we do not have a cyst and to document further fat necrosis if that is what is going on.  She may ultimately need an MRI for evaluation.  She is very dissatisfied with her reconstruction.  I have offered her referral to plastics and she would like to wait on that for now.  Otherwise the plan is to continue tamoxifen a minimum  of 5 years.  She will see me again in a year from now.  She knows to call for any other issue that may develop before that visit  Total encounter time 25 minutes.*  Drianna Chandran, Stacey Hue, MD  08/28/19 9:26 AM Medical Oncology and Hematology [facility] [address] [place], [place] [address] Tel. [phone]    Fax. [phone]   I, Mickie Bail, am acting as scribe for Dr. Valentino Roberts. Aerial Dilley.  I, Ruthann Cancer MD, have reviewed the above documentation for accuracy and completeness, and I agree with the above.    *Total Encounter Time as defined by the Centers for Medicare and Medicaid Services includes, in addition to the face-to-face time of a patient visit (documented in the note above) non-face-to-face time: obtaining and reviewing outside history, ordering and reviewing medications, tests or procedures, care coordination (communications with other health care professionals or caregivers) and documentation in the medical record.

## 2019-08-28 ENCOUNTER — Other Ambulatory Visit: Payer: Self-pay

## 2019-08-28 ENCOUNTER — Inpatient Hospital Stay: Payer: No Typology Code available for payment source

## 2019-08-28 ENCOUNTER — Inpatient Hospital Stay: Payer: No Typology Code available for payment source | Attending: Oncology | Admitting: Oncology

## 2019-08-28 VITALS — BP 143/80 | HR 90 | Temp 98.3°F | Resp 18 | Ht 64.0 in | Wt 230.3 lb

## 2019-08-28 DIAGNOSIS — Z170 Estrogen receptor positive status [ER+]: Secondary | ICD-10-CM | POA: Diagnosis not present

## 2019-08-28 DIAGNOSIS — C50112 Malignant neoplasm of central portion of left female breast: Secondary | ICD-10-CM

## 2019-08-28 DIAGNOSIS — Z87891 Personal history of nicotine dependence: Secondary | ICD-10-CM | POA: Insufficient documentation

## 2019-08-28 DIAGNOSIS — Z923 Personal history of irradiation: Secondary | ICD-10-CM | POA: Insufficient documentation

## 2019-08-28 DIAGNOSIS — D0512 Intraductal carcinoma in situ of left breast: Secondary | ICD-10-CM | POA: Diagnosis present

## 2019-08-28 DIAGNOSIS — D0510 Intraductal carcinoma in situ of unspecified breast: Secondary | ICD-10-CM

## 2019-08-28 DIAGNOSIS — C73 Malignant neoplasm of thyroid gland: Secondary | ICD-10-CM

## 2019-08-28 DIAGNOSIS — N898 Other specified noninflammatory disorders of vagina: Secondary | ICD-10-CM | POA: Diagnosis not present

## 2019-08-28 LAB — CBC WITH DIFFERENTIAL/PLATELET
Abs Immature Granulocytes: 0.02 10*3/uL (ref 0.00–0.07)
Basophils Absolute: 0 10*3/uL (ref 0.0–0.1)
Basophils Relative: 1 %
Eosinophils Absolute: 0.1 10*3/uL (ref 0.0–0.5)
Eosinophils Relative: 2 %
HCT: 42.3 % (ref 36.0–46.0)
Hemoglobin: 13.6 g/dL (ref 12.0–15.0)
Immature Granulocytes: 0 %
Lymphocytes Relative: 20 %
Lymphs Abs: 1.5 10*3/uL (ref 0.7–4.0)
MCH: 27.8 pg (ref 26.0–34.0)
MCHC: 32.2 g/dL (ref 30.0–36.0)
MCV: 86.3 fL (ref 80.0–100.0)
Monocytes Absolute: 0.5 10*3/uL (ref 0.1–1.0)
Monocytes Relative: 6 %
Neutro Abs: 5.6 10*3/uL (ref 1.7–7.7)
Neutrophils Relative %: 71 %
Platelets: 241 10*3/uL (ref 150–400)
RBC: 4.9 MIL/uL (ref 3.87–5.11)
RDW: 14.6 % (ref 11.5–15.5)
WBC: 7.8 10*3/uL (ref 4.0–10.5)
nRBC: 0 % (ref 0.0–0.2)

## 2019-08-28 LAB — COMPREHENSIVE METABOLIC PANEL
ALT: 13 U/L (ref 0–44)
AST: 11 U/L — ABNORMAL LOW (ref 15–41)
Albumin: 3.4 g/dL — ABNORMAL LOW (ref 3.5–5.0)
Alkaline Phosphatase: 84 U/L (ref 38–126)
Anion gap: 11 (ref 5–15)
BUN: 11 mg/dL (ref 6–20)
CO2: 25 mmol/L (ref 22–32)
Calcium: 9.3 mg/dL (ref 8.9–10.3)
Chloride: 103 mmol/L (ref 98–111)
Creatinine, Ser: 0.81 mg/dL (ref 0.44–1.00)
GFR calc Af Amer: >60 mL/min (ref >60)
GFR calc non Af Amer: >60 mL/min (ref >60)
Glucose, Bld: 274 mg/dL — ABNORMAL HIGH (ref 70–99)
Potassium: 4.5 mmol/L (ref 3.5–5.1)
Sodium: 139 mmol/L (ref 135–145)
Total Bilirubin: 0.4 mg/dL (ref 0.3–1.2)
Total Protein: 6.9 g/dL (ref 6.5–8.1)

## 2019-09-01 ENCOUNTER — Telehealth: Payer: Self-pay | Admitting: Oncology

## 2019-09-01 NOTE — Telephone Encounter (Signed)
Scheduled appts per 8/5 los. Pt confirmed appt date and time.

## 2019-09-10 ENCOUNTER — Ambulatory Visit
Admission: RE | Admit: 2019-09-10 | Discharge: 2019-09-10 | Disposition: A | Discharge status: Home / Self Care | Payer: No Typology Code available for payment source | Source: Ambulatory Visit | Attending: Oncology | Admitting: Oncology

## 2019-09-10 ENCOUNTER — Ambulatory Visit
Admission: RE | Admit: 2019-09-10 | Discharge: 2019-09-10 | Disposition: A | Discharge status: Home / Self Care | Payer: No Typology Code available for payment source | Source: Ambulatory Visit | Attending: Internal Medicine | Admitting: Internal Medicine

## 2019-09-10 ENCOUNTER — Other Ambulatory Visit: Payer: Self-pay | Admitting: Internal Medicine

## 2019-09-10 ENCOUNTER — Other Ambulatory Visit: Payer: Self-pay

## 2019-09-10 DIAGNOSIS — C50112 Malignant neoplasm of central portion of left female breast: Secondary | ICD-10-CM

## 2019-09-10 DIAGNOSIS — R234 Changes in skin texture: Secondary | ICD-10-CM

## 2019-09-10 DIAGNOSIS — R921 Mammographic calcification found on diagnostic imaging of breast: Secondary | ICD-10-CM

## 2019-09-15 ENCOUNTER — Other Ambulatory Visit: Payer: Self-pay | Admitting: Internal Medicine

## 2019-09-17 ENCOUNTER — Other Ambulatory Visit: Payer: Self-pay

## 2019-09-17 ENCOUNTER — Ambulatory Visit
Admission: RE | Admit: 2019-09-17 | Discharge: 2019-09-17 | Disposition: A | Discharge status: Home / Self Care | Payer: No Typology Code available for payment source | Source: Ambulatory Visit | Attending: Internal Medicine | Admitting: Internal Medicine

## 2019-09-17 DIAGNOSIS — R921 Mammographic calcification found on diagnostic imaging of breast: Secondary | ICD-10-CM

## 2019-09-17 DIAGNOSIS — R234 Changes in skin texture: Secondary | ICD-10-CM

## 2019-09-18 ENCOUNTER — Other Ambulatory Visit: Payer: Self-pay | Admitting: *Deleted

## 2019-09-18 DIAGNOSIS — Z170 Estrogen receptor positive status [ER+]: Secondary | ICD-10-CM

## 2019-09-18 DIAGNOSIS — C50112 Malignant neoplasm of central portion of left female breast: Secondary | ICD-10-CM

## 2019-09-22 ENCOUNTER — Ambulatory Visit: Payer: No Typology Code available for payment source | Admitting: Endocrinology

## 2019-10-01 ENCOUNTER — Other Ambulatory Visit: Payer: Self-pay

## 2019-10-01 ENCOUNTER — Ambulatory Visit (HOSPITAL_COMMUNITY)
Admission: RE | Admit: 2019-10-01 | Discharge: 2019-10-01 | Disposition: A | Discharge status: Home / Self Care | Payer: No Typology Code available for payment source | Source: Ambulatory Visit | Attending: Oncology | Admitting: Oncology

## 2019-10-01 DIAGNOSIS — Z170 Estrogen receptor positive status [ER+]: Secondary | ICD-10-CM | POA: Insufficient documentation

## 2019-10-01 DIAGNOSIS — C50112 Malignant neoplasm of central portion of left female breast: Secondary | ICD-10-CM | POA: Insufficient documentation

## 2019-10-01 MED ORDER — GADOBUTROL 1 MMOL/ML IV SOLN
10.0000 mL | Freq: Once | INTRAVENOUS | Status: AC | PRN
  Administered 2019-10-01: 10 mL via INTRAVENOUS

## 2019-10-06 ENCOUNTER — Other Ambulatory Visit: Payer: Self-pay | Admitting: Oncology

## 2019-10-08 ENCOUNTER — Telehealth: Payer: Self-pay | Admitting: *Deleted

## 2019-10-08 ENCOUNTER — Encounter: Payer: Self-pay | Admitting: Internal Medicine

## 2019-10-08 DIAGNOSIS — R928 Other abnormal and inconclusive findings on diagnostic imaging of breast: Secondary | ICD-10-CM

## 2019-10-08 NOTE — Telephone Encounter (Signed)
Referral placed.

## 2019-10-08 NOTE — Addendum Note (Signed)
Addended by: Jearld Fenton on: 10/08/2019 09:57 PM   Modules accepted: Orders

## 2019-10-08 NOTE — Telephone Encounter (Signed)
This RN spoke with pt per call stating she is scheduled for punch biopsy per abnormal breast MRI this Friday 9/17 with Dr Rush Farmer at [facility].  She states need for a referral - which has to come through her primary MD - Dr [name].  Memorie has sent a My Chart to her per above- and this RN will send this note as well to facilitate communication.

## 2019-10-09 ENCOUNTER — Other Ambulatory Visit: Payer: Self-pay | Admitting: Endocrinology

## 2019-10-21 ENCOUNTER — Other Ambulatory Visit: Payer: Self-pay | Admitting: Surgery

## 2019-10-28 ENCOUNTER — Other Ambulatory Visit: Payer: Self-pay | Admitting: Surgery

## 2019-10-29 ENCOUNTER — Encounter: Payer: Self-pay | Admitting: Internal Medicine

## 2019-10-29 ENCOUNTER — Other Ambulatory Visit: Payer: Self-pay

## 2019-10-29 ENCOUNTER — Ambulatory Visit (INDEPENDENT_AMBULATORY_CARE_PROVIDER_SITE_OTHER): Payer: No Typology Code available for payment source | Admitting: Internal Medicine

## 2019-10-29 ENCOUNTER — Ambulatory Visit (INDEPENDENT_AMBULATORY_CARE_PROVIDER_SITE_OTHER): Payer: No Typology Code available for payment source | Admitting: Endocrinology

## 2019-10-29 VITALS — BP 130/80 | HR 93 | Ht 64.0 in | Wt 231.0 lb

## 2019-10-29 VITALS — BP 114/82 | HR 97 | Ht 64.0 in | Wt 231.0 lb

## 2019-10-29 DIAGNOSIS — R002 Palpitations: Secondary | ICD-10-CM

## 2019-10-29 DIAGNOSIS — I25118 Atherosclerotic heart disease of native coronary artery with other forms of angina pectoris: Secondary | ICD-10-CM | POA: Insufficient documentation

## 2019-10-29 DIAGNOSIS — E785 Hyperlipidemia, unspecified: Secondary | ICD-10-CM

## 2019-10-29 DIAGNOSIS — Z794 Long term (current) use of insulin: Secondary | ICD-10-CM | POA: Diagnosis not present

## 2019-10-29 DIAGNOSIS — E1142 Type 2 diabetes mellitus with diabetic polyneuropathy: Secondary | ICD-10-CM

## 2019-10-29 DIAGNOSIS — I25110 Atherosclerotic heart disease of native coronary artery with unstable angina pectoris: Secondary | ICD-10-CM | POA: Insufficient documentation

## 2019-10-29 DIAGNOSIS — I10 Essential (primary) hypertension: Secondary | ICD-10-CM | POA: Diagnosis not present

## 2019-10-29 DIAGNOSIS — I2510 Atherosclerotic heart disease of native coronary artery without angina pectoris: Secondary | ICD-10-CM

## 2019-10-29 DIAGNOSIS — C73 Malignant neoplasm of thyroid gland: Secondary | ICD-10-CM | POA: Diagnosis not present

## 2019-10-29 MED ORDER — OZEMPIC (0.25 OR 0.5 MG/DOSE) 2 MG/1.5ML ~~LOC~~ SOPN: 0.5000 mg | PEN_INJECTOR | SUBCUTANEOUS | 3 refills | Status: DC

## 2019-10-29 NOTE — Progress Notes (Signed)
Subjective:    Patient ID: Stacey Roberts, female    DOB: 06-03-1965, 54 y.o.   MRN: [id number]  HPI Pt has stage-1 papillary adenocarcinoma of the thyroid.    2/11: thyroidectomy: T1b N0 M0.  4/11: RAI 103 mCi, with thyrogen.  12/11: neck US: single enlarged right cervical lymph node, nonspecific.   6/12: body scan (thyrogen) neg.   12/12: [place]: lymph node is stable.  6/13: [place]: overall node morphology and volume show very little change.   9/14  TG undetectable (ab neg) 3/15: TG undetectable (ab neg) 10/15 TG undetectable (ab neg).  3/17 TG undetectable (ab neg).   4/19 TG undetectable (ab neg).   6/20 [place] no tumor 2/20 TG undetectable (ab neg).   3/21 TG undetectable (ab neg).   Goal TSH is normal, due to long disease-free interval.  Pt returns for f/u of diabetes mellitus:  DM type: Insulin-requiring type 2 Dx'ed: 5883 Complications: PPN and CAD.  Therapy: insulin since early 2017 and 2 oral meds.   GDM: never.  DKA: never.   Severe hypoglycemia: never.   Pancreatitis: never.  Other: she declines multiple daily injections; edema precudes pioglitizone rx.  She declines bariatric surgery for now.  She declines to add bromocriptine; she did not tolerate Trulicity (nausea). Interval history: She never misses meds.  no cbg record, but states cbg varies from 100's-200's.  She is very hesitant to increase meds.   Past Medical History:  Diagnosis Date  . Allergy    seasonal  . BRCA negative 2017  . Breast cancer ([place]) 2014  . Breast cancer ([place]) 2017  . Bronchitis    hx of  . Coronary artery disease 06/2019   80% mid RCA stenosis - medical management  . Diabetes mellitus, type II ([place])   . Endometrial hyperplasia 2014   Mirena placed; Dr. Carren Rang  . GERD (gastroesophageal reflux disease)   . Headache   . History of radiation therapy 12/01/15-01/11/16   Left breast DIBH / 50.4 Gy in 28 fractions and Left breast boost / 12 Gy in 6 fractions  . Hyperlipidemia   . Hypertension    . Hypothyroidism   . Joint pain   . Obesity   . PCOS (polycystic ovarian syndrome)   . Personal history of radiation therapy 2017  . Pneumonia    as a child  . Sleep apnea 2008   severe OSA-does not use a cpap  . Snoring   . 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 Buddy Duty    Past Surgical History:  Procedure Laterality Date  . BREAST BIOPSY  2000   s/p  . BREAST BIOPSY Left 01/22/2013   Procedure: RE-EXCISION LEFT BREAST DCIS;  Surgeon: Harl Bowie, MD;  Location: [facility];  Service: General;  Laterality: Left;  . BREAST LUMPECTOMY Left 01/06/2013   Procedure: LUMPECTOMY;  Surgeon: Harl Bowie, MD;  Location: [facility];  Service: General;  Laterality: Left;  . BREAST LUMPECTOMY Left 2017  . BREAST LUMPECTOMY WITH NEEDLE LOCALIZATION AND AXILLARY SENTINEL LYMPH NODE BX Left 09/30/2015   Procedure: Left BREAST LUMPECTOMY WITH NEEDLE LOCALIZATION AND AXILLARY SENTINEL LYMPH NODE BX;  Surgeon: Coralie Keens, MD;  Location: [facility];  Service: General;  Laterality: Left;  . BREAST RECONSTRUCTION Bilateral 10/12/2015   Procedure: BREAST oncoplastic RECONSTRUCTION;  Surgeon: Irene Limbo, MD;  Location: [facility];  Service: Plastics;  Laterality: Bilateral;  . BREAST REDUCTION SURGERY Bilateral 10/12/2015   Procedure: MAMMARY REDUCTION  (BREAST);  Surgeon: Irene Limbo, MD;  Location: [facility];  Service: Plastics;  Laterality: Bilateral;  . DILATION AND CURETTAGE OF UTERUS  2004   s/p  . EXCISION OF BREAST LESION Left 10/12/2015   Procedure: Re-EXCISION OF BREAST;  Surgeon: Coralie Keens, MD;  Location: [facility];  Service: General;  Laterality: Left;  . LEFT HEART CATH AND CORONARY ANGIOGRAPHY N/A 06/27/2019   Procedure: LEFT HEART CATH AND CORONARY ANGIOGRAPHY;  Surgeon: Nelva Bush, MD;  Location:  [facility];  Service: Cardiovascular;  Laterality: N/A;  . REDUCTION MAMMAPLASTY Bilateral 09/2015  . THYROIDECTOMY  02/2009   Dr Harlow Asa  . TONSILLECTOMY  1982    Social History   Socioeconomic History  . Marital status: Single    Spouse name: Not on file  . Number of children: Not on file  . Years of education: Not on file  . Highest education level: Not on file  Occupational History    Employer: [employer]    Comment: Outpatient scheduling in radiology  Tobacco Use  . Smoking status: Former Smoker    Packs/day: 0.50    Types: Cigarettes    Quit date: 09/29/2007    Years since quitting: 12.0  . Smokeless tobacco: Never Used  . Tobacco comment: Smoked on and off for 20 years. Quit about 8 years ago (as of 08/2015)  Vaping Use  . Vaping Use: Never used  Substance and Sexual Activity  . Alcohol use: Not Currently    Alcohol/week: 0.0 standard drinks  . Drug use: No  . Sexual activity: Not Currently    Birth control/protection: I.U.D.  Other Topics Concern  . Not on file  Social History Narrative   The patient is divorced, moved to [place] in   2006 from [place].  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    No alcohol.  Tobacco use:  She quit 5 years ago.  She smoked on   and off for approximately 20 years.  No history of recreational drug   Use.Drinks two cups of coffee per work day.    Social Determinants of Health   Financial Resource Strain:   . Difficulty of Paying Living Expenses: Not on file  Food Insecurity:   . Worried About Charity fundraiser in the Last Year: Not on file  . Ran Out of Food in the Last Year: Not on file  Transportation Needs:   . Lack of Transportation (Medical): Not on file  . Lack of Transportation (Non-Medical): Not on file  Physical Activity:   . Days of Exercise per Week: Not on file  . Minutes of Exercise per Session: Not on file   Stress:   . Feeling of Stress : Not on file  Social Connections:   . Frequency of Communication with Friends and Family: Not on file  . Frequency of Social Gatherings with Friends and Family: Not on file  . Attends Religious Services: Not on file  . Active Member of Clubs or Organizations: Not on file  . Attends Archivist Meetings: Not on file  . Marital Status: Not on file  Intimate Partner Violence:   . Fear of Current or Ex-Partner: Not on file  . Emotionally Abused: Not on file  . Physically Abused: Not on  file  . Sexually Abused: Not on file    Current Outpatient Medications on File Prior to Visit  Medication Sig Dispense Refill  . aspirin EC 81 MG tablet Take 1 tablet (81 mg total) by mouth daily. 90 tablet 3  . Blood Glucose Monitoring Suppl (FREESTYLE LITE) DEVI 1 each by Does not apply route 2 (two) times daily. [name] 1 each 0  . calcium carbonate (OSCAL) 1500 (600 Ca) MG TABS tablet Take 600 mg of elemental calcium by mouth daily with breakfast.     . FARXIGA 10 MG TABS tablet TAKE 10 MG BY MOUTH DAILY. 90 tablet 2  . gabapentin (NEURONTIN) 300 MG capsule Take 2 capsules (600 mg total) by mouth at bedtime. 180 capsule 4  . glucose blood (FREESTYLE LITE) test strip 1 each by Other route 2 (two) times daily. [name] 200 each 0  . Insulin Glargine (BASAGLAR KWIKPEN) 100 UNIT/ML Inject 0.5 mLs (50 Units total) into the skin every morning. 25 pen 3  . Lancets (FREESTYLE) lancets 1 each by Other route 2 (two) times daily. [name] 200 each 0  . levonorgestrel (MIRENA) 20 MCG/24HR IUD 1 each by Intrauterine route once.    Marland Kitchen levothyroxine (SYNTHROID) 137 MCG tablet Take 1 tablet (137 mcg total) by mouth daily before breakfast. 90 tablet 3  . losartan (COZAAR) 50 MG tablet TAKE 1 TABLET (50 MG TOTAL) BY MOUTH DAILY. 90 tablet 0  . metFORMIN (GLUCOPHAGE-XR) 500 MG 24 hr tablet Take 4 tablets (2,000 mg total) by mouth daily with breakfast. 360 tablet 3  . metoprolol tartrate  (LOPRESSOR) 50 MG tablet Take 1 tablet (50 mg total) by mouth 2 (two) times daily. 180 tablet 1  . Multiple Vitamins-Minerals (MULTIVITAMIN WITH MINERALS) tablet Take 1 tablet by mouth daily. Woman's Gummies    . nitroGLYCERIN (NITROSTAT) 0.4 MG SL tablet Place 1 tablet (0.4 mg total) under the tongue every 5 (five) minutes as needed for chest pain. 25 tablet prn  . pantoprazole (PROTONIX) 40 MG tablet TAKE 1 TABLET (40 MG TOTAL) BY MOUTH DAILY. MUST SCHEDULE PHYSICAL EXAM (Patient taking differently: Take 40 mg by mouth daily. ) 90 tablet 3  . pregabalin (LYRICA) 75 MG capsule Take 1 capsule (75 mg total) by mouth 2 (two) times daily. (Patient taking differently: Take 75 mg by mouth daily. ) 180 capsule 3  . rosuvastatin (CRESTOR) 20 MG tablet Take 1 tablet (20 mg total) by mouth daily. 90 tablet 3  . tamoxifen (NOLVADEX) 20 MG tablet Take 1 tablet (20 mg total) by mouth daily. 90 tablet 3  . oxyCODONE (OXY IR/ROXICODONE) 5 MG immediate release tablet Take 5 mg by mouth as needed.     No current facility-administered medications on file prior to visit.    Allergies  Allergen Reactions  . Penicillins Shortness Of Breath, Rash and Other (See Comments)    Has patient had a PCN reaction causing immediate rash, facial/tongue/throat swelling, SOB or lightheadedness with hypotension: Yes Has patient had a PCN reaction causing severe rash involving mucus membranes or skin necrosis: Yes Has patient had a PCN reaction that required hospitalization No Has patient had a PCN reaction occurring within the last 10 years: Yes If all of the above answers are "NO", then may proceed with Cephalosporin use.   . Clindamycin/Lincomycin Other (See Comments)    Severe stomach cramps  . Ace Inhibitors Other (See Comments) and Cough    REACTION: cough; denies airway involvement     Family History  Problem  Relation Age of Onset  . Dementia Father        deceased age 25 secondary to dementia  . Bipolar disorder  Father   . Emphysema Father   . Heart disease Father   . COPD Father        smoker  . Hypertension Father   . Depression Father   . Alcoholism Father   . CAD Mother 11       Died age 38 of CAD  . Heart disease Mother        CABG x 2  . Graves' disease Mother   . Hypertension Mother   . Thyroid disease Mother   . Heart attack Mother 82  . CAD Maternal Grandfather 60  . Heart disease Maternal Grandfather   . Heart attack Maternal Grandfather 64  . Other Sister 23       hysterectomy for fibroids  . Prostate cancer Brother        low grade; w/ surveillance  . Thyroid nodules Brother 38  . Heart disease Brother 21       CABG x 4  . Heart disease Sister        CABG x 2  . Heart attack Sister 2  . Fibroids Other        niece dx approx 40  . Hypertension Other   . Kidney cancer Cousin        maternal 1st cousin dx 38-47; former smoker  . Lung cancer Other        maternal great aunt (MGM's sister); not a smoker  . Cancer Other        nephew dx neuroblastoma at 74.40 years old  . Colon cancer Neg Hx   . Colon polyps Neg Hx     BP 130/80   Pulse 93   Ht '5\' 4"'  (1.626 m)   Wt 231 lb (104.8 kg)   SpO2 94%   BMI 39.65 kg/m    Review of Systems She denies hypoglycemia and nausea    Objective:   Physical Exam VITAL SIGNS:  See vs page GENERAL: no distress Pulses: dorsalis pedis intact bilat.   MSK: no deformity of the feet CV: trace bilat leg edema Skin:  no ulcer on the feet.  normal color and temp on the feet. Neuro: sensation is intact to touch on the feet  A1c=10.0%  Lab Results  Component Value Date   TSH 0.60 05/22/2019   T3TOTAL 143 03/21/2017       Assessment & Plan:  Insulin-requiring type 2 DM, with CAD: uncontrolled PTC: due for recheck.  Patient Instructions  I have sent a prescription to your pharmacy, to increase the Ozempic Please continue the same other diabetes medications.  Blood tests are requested for you today.  Please have drawn the  next time you are having blood taken.  We'll let you know about the results.  check your blood sugar twice a day.  vary the time of day when you check, between before the 3 meals, and at bedtime.  also check if you have symptoms of your blood sugar being too high or too low.  please keep a record of the readings and bring it to your next appointment here (or you can bring the meter itself).  You can write it on any piece of paper.  please call us sooner if your blood sugar goes below 70, or if you have a lot of readings over 200.  Please come back for a  follow-up appointment in 2 months.

## 2019-10-29 NOTE — Patient Instructions (Signed)
Call us if the palpitations return or worsen, then we can discuss mailing you a monitor to wear.    Medication Instructions:  Your physician recommends that you continue on your current medications as directed. Please refer to the Current Medication list given to you today. *If you need a refill on your cardiac medications before your next appointment, please call your pharmacy*  Follow-Up: At [facility], you and your health needs are our priority.  As part of our continuing mission to provide you with exceptional heart care, we have created designated Provider Care Teams.  These Care Teams include your primary Cardiologist (physician) and Advanced Practice Providers (APPs -  Physician Assistants and Nurse Practitioners) who all work together to provide you with the care you need, when you need it.  We recommend signing up for the patient portal called "MyChart".  Sign up information is provided on this After Visit Summary.  MyChart is used to connect with patients for Virtual Visits (Telemedicine).  Patients are able to view lab/test results, encounter notes, upcoming appointments, etc.  Non-urgent messages can be sent to your provider as well.   To learn more about what you can do with MyChart, go to [email or web address].    Your next appointment:   6 month(s)  The format for your next appointment:   In Person  Provider:   You may see Nelva Bush, MD or one of the following Advanced Practice Providers on your designated Care Team:    Murray Hodgkins, NP  Christell Faith, PA-C  Marrianne Mood, PA-C  Cadence Adrian, [place]

## 2019-10-29 NOTE — Patient Instructions (Addendum)
I have sent a prescription to your pharmacy, to increase the Ozempic Please continue the same other diabetes medications.  Blood tests are requested for you today.  Please have drawn the next time you are having blood taken.  We'll let you know about the results.  check your blood sugar twice a day.  vary the time of day when you check, between before the 3 meals, and at bedtime.  also check if you have symptoms of your blood sugar being too high or too low.  please keep a record of the readings and bring it to your next appointment here (or you can bring the meter itself).  You can write it on any piece of paper.  please call us sooner if your blood sugar goes below 70, or if you have a lot of readings over 200.  Please come back for a follow-up appointment in 2 months.

## 2019-10-29 NOTE — Progress Notes (Signed)
Follow-up Outpatient Visit Date: 10/29/2019  Primary Care Provider: Jearld Fenton, NP [place] [address]  Chief Complaint: Palpitations  HPI:  Stacey Roberts is a 54 y.o. female with history of coronary artery disease with 80% mid RCA stenosis being managed medically, hypertension, hyperlipidemia, type 2 diabetes mellitus, prior tobacco use, and breast cancer complicated by recent inflammation in the surgical/radiation bed,, who presents for follow-up of coronary artery disease.  She was last seen in our office in early July, at which time she was doing well from a heart standpoint.  Stacey Roberts notes that she has not had any further chest pain.  She notes transient palpitations once or twice a month, which she describes as a rapid fluttering in her chest that lasts about 20 seconds.  There is mild lightheadedness associated with this but no other symptoms.  There are no clear precipitants.  She has not had any shortness of breath or edema.  She is not exercising regularly.  Stacey Roberts has noted some pain, redness, and skin distortion of her left breast.  Recent skin biopsy and physical/radiologic examinations did not show recurrent cancer.  She continues to follow closely with her surgeon and medical oncologist.  --------------------------------------------------------------------------------------------------  Past Medical History:  Diagnosis Date  . Allergy    seasonal  . BRCA negative 2017  . Breast cancer ([facility]) 2014  . Breast cancer ([facility]) 2017  . Bronchitis    hx of  . Coronary artery disease 06/2019   80% mid RCA stenosis - medical management  . Diabetes mellitus, type II ([facility])   . Endometrial hyperplasia 2014   Mirena placed; Dr. Carren Rang  . GERD (gastroesophageal reflux disease)   . Headache   . History of radiation therapy 12/01/15-01/11/16   Left breast DIBH / 50.4 Gy in 28 fractions and Left breast boost / 12 Gy in 6 fractions  . Hyperlipidemia    . Hypertension   . Hypothyroidism   . Joint pain   . Obesity   . PCOS (polycystic ovarian syndrome)   . Personal history of radiation therapy 2017  . Pneumonia    as a child  . Sleep apnea 2008   severe OSA-does not use a cpap  . Snoring   . 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 Buddy Duty   Past Surgical History:  Procedure Laterality Date  . BREAST BIOPSY  2000   s/p  . BREAST BIOPSY Left 01/22/2013   Procedure: RE-EXCISION LEFT BREAST DCIS;  Surgeon: Harl Bowie, MD;  Location: [facility];  Service: General;  Laterality: Left;  . BREAST LUMPECTOMY Left 01/06/2013   Procedure: LUMPECTOMY;  Surgeon: Harl Bowie, MD;  Location: [facility];  Service: General;  Laterality: Left;  . BREAST LUMPECTOMY Left 2017  . BREAST LUMPECTOMY WITH NEEDLE LOCALIZATION AND AXILLARY SENTINEL LYMPH NODE BX Left 09/30/2015   Procedure: Left BREAST LUMPECTOMY WITH NEEDLE LOCALIZATION AND AXILLARY SENTINEL LYMPH NODE BX;  Surgeon: Coralie Keens, MD;  Location: [facility];  Service: General;  Laterality: Left;  . BREAST RECONSTRUCTION Bilateral 10/12/2015   Procedure: BREAST oncoplastic RECONSTRUCTION;  Surgeon: Irene Limbo, MD;  Location: [facility];  Service: Plastics;  Laterality: Bilateral;  . BREAST REDUCTION SURGERY Bilateral 10/12/2015   Procedure: MAMMARY REDUCTION  (BREAST);  Surgeon: Irene Limbo, MD;  Location: [facility];  Service: Plastics;  Laterality: Bilateral;  .  DILATION AND CURETTAGE OF UTERUS  2004   s/p  . EXCISION OF BREAST LESION Left 10/12/2015   Procedure: Re-EXCISION OF BREAST;  Surgeon: Coralie Keens, MD;  Location: [facility];  Service: General;  Laterality: Left;  . LEFT HEART CATH AND CORONARY ANGIOGRAPHY N/A 06/27/2019   Procedure: LEFT HEART CATH AND CORONARY ANGIOGRAPHY;  Surgeon: Nelva Bush,  MD;  Location: [facility];  Service: Cardiovascular;  Laterality: N/A;  . REDUCTION MAMMAPLASTY Bilateral 09/2015  . THYROIDECTOMY  02/2009   Dr Harlow Asa  . TONSILLECTOMY  1982    Current Meds  Medication Sig  . aspirin EC 81 MG tablet Take 1 tablet (81 mg total) by mouth daily.  . Blood Glucose Monitoring Suppl (FREESTYLE LITE) DEVI 1 each by Does not apply route 2 (two) times daily. [name]  . calcium carbonate (OSCAL) 1500 (600 Ca) MG TABS tablet Take 600 mg of elemental calcium by mouth daily with breakfast.   . FARXIGA 10 MG TABS tablet TAKE 10 MG BY MOUTH DAILY.  Marland Kitchen gabapentin (NEURONTIN) 300 MG capsule Take 2 capsules (600 mg total) by mouth at bedtime.  Marland Kitchen glucose blood (FREESTYLE LITE) test strip 1 each by Other route 2 (two) times daily. [name]  . Insulin Glargine (BASAGLAR KWIKPEN) 100 UNIT/ML Inject 0.5 mLs (50 Units total) into the skin every morning.  . Lancets (FREESTYLE) lancets 1 each by Other route 2 (two) times daily. [name]  . levonorgestrel (MIRENA) 20 MCG/24HR IUD 1 each by Intrauterine route once.  Marland Kitchen levothyroxine (SYNTHROID) 137 MCG tablet Take 1 tablet (137 mcg total) by mouth daily before breakfast.  . losartan (COZAAR) 50 MG tablet TAKE 1 TABLET (50 MG TOTAL) BY MOUTH DAILY.  . metFORMIN (GLUCOPHAGE-XR) 500 MG 24 hr tablet Take 4 tablets (2,000 mg total) by mouth daily with breakfast.  . metoprolol tartrate (LOPRESSOR) 50 MG tablet Take 1 tablet (50 mg total) by mouth 2 (two) times daily.  . Multiple Vitamins-Minerals (MULTIVITAMIN WITH MINERALS) tablet Take 1 tablet by mouth daily. Woman's Gummies  . nitroGLYCERIN (NITROSTAT) 0.4 MG SL tablet Place 1 tablet (0.4 mg total) under the tongue every 5 (five) minutes as needed for chest pain.  Marland Kitchen oxyCODONE (OXY IR/ROXICODONE) 5 MG immediate release tablet Take 5 mg by mouth as needed.  . pantoprazole (PROTONIX) 40 MG tablet TAKE 1 TABLET (40 MG TOTAL) BY MOUTH DAILY. MUST SCHEDULE PHYSICAL EXAM (Patient taking  differently: Take 40 mg by mouth daily. )  . pregabalin (LYRICA) 75 MG capsule Take 1 capsule (75 mg total) by mouth 2 (two) times daily. (Patient taking differently: Take 75 mg by mouth daily. )  . rosuvastatin (CRESTOR) 20 MG tablet Take 1 tablet (20 mg total) by mouth daily.  . Semaglutide,0.25 or 0.5MG/DOS, (OZEMPIC, 0.25 OR 0.5 MG/DOSE,) 2 MG/1.5ML SOPN Inject 0.5 mg into the skin once a week.  . tamoxifen (NOLVADEX) 20 MG tablet Take 1 tablet (20 mg total) by mouth daily.    Allergies: Penicillins, Clindamycin/lincomycin, and Ace inhibitors  Social History   Tobacco Use  . Smoking status: Former Smoker    Packs/day: 0.50    Types: Cigarettes    Quit date: 09/29/2007    Years since quitting: 12.0  . Smokeless tobacco: Never Used  . Tobacco comment: Smoked on and off for 20 years. Quit about 8 years ago (as of 08/2015)  Vaping Use  . Vaping Use: Never used  Substance Use Topics  . Alcohol use: Not Currently  Alcohol/week: 0.0 standard drinks  . Drug use: No    Family History  Problem Relation Age of Onset  . Dementia Father        deceased age 82 secondary to dementia  . Bipolar disorder Father   . Emphysema Father   . Heart disease Father   . COPD Father        smoker  . Hypertension Father   . Depression Father   . Alcoholism Father   . CAD Mother 97       Died age 61 of CAD  . Heart disease Mother        CABG x 2  . Graves' disease Mother   . Hypertension Mother   . Thyroid disease Mother   . Heart attack Mother 32  . CAD Maternal Grandfather 60  . Heart disease Maternal Grandfather   . Heart attack Maternal Grandfather 64  . Other Sister 26       hysterectomy for fibroids  . Prostate cancer Brother        low grade; w/ surveillance  . Thyroid nodules Brother 30  . Heart disease Brother 53       CABG x 4  . Heart disease Sister        CABG x 2  . Heart attack Sister 86  . Fibroids Other        niece dx approx 63  . Hypertension Other   . Kidney  cancer Cousin        maternal 1st cousin dx 24-47; former smoker  . Lung cancer Other        maternal great aunt (MGM's sister); not a smoker  . Cancer Other        nephew dx neuroblastoma at 67.65 years old  . Colon cancer Neg Hx   . Colon polyps Neg Hx     Review of Systems: A 12-system review of systems was performed and was negative except as noted in the HPI.  --------------------------------------------------------------------------------------------------  Physical Exam: BP 114/82 (BP Location: Left Arm, Patient Position: Sitting, Cuff Size: Normal)   Pulse 97   Ht '5\' 4"'  (1.626 m)   Wt 231 lb (104.8 kg)   SpO2 98%   BMI 39.65 kg/m   General: NAD. Neck: No JVD or HJR. Lungs: Clear to auscultation bilateral without wheezes or crackles. Heart: Regular rate and rhythm without murmurs, rubs, or gallops. Abdomen: Soft, nontender, nondistended. Extremities: No lower extremity edema.  EKG: Normal sinus rhythm without abnormality.  Lab Results  Component Value Date   WBC 7.8 08/28/2019   HGB 13.6 08/28/2019   HCT 42.3 08/28/2019   MCV 86.3 08/28/2019   PLT 241 08/28/2019    Lab Results  Component Value Date   NA 139 08/28/2019   K 4.5 08/28/2019   CL 103 08/28/2019   CO2 25 08/28/2019   BUN 11 08/28/2019   CREATININE 0.81 08/28/2019   GLUCOSE 274 (H) 08/28/2019   ALT 13 08/28/2019    Lab Results  Component Value Date   CHOL 104 03/21/2019   HDL 35.50 (L) 03/21/2019   LDLCALC 34 03/21/2019   LDLDIRECT 153.0 09/01/2014   TRIG 172.0 (H) 03/21/2019   CHOLHDL 3 03/21/2019    --------------------------------------------------------------------------------------------------  ASSESSMENT AND PLAN: Coronary artery disease without angina: Stacey Roberts continues to do well without any recurrent chest pain on metoprolol.  We have discussed the indications for intervention.  As she is not having any angina at this time, we have agreed  to continue with medical therapy  and secondary prevention.  If she were to have recurrent angina refractory to medical therapy, we could consider PCI to the RCA though this may require lithotripsy and/or atherectomy given associated calcification.  Palpitations: Relatively infrequent and brief in duration, though lightheadedness is associated.  We discussed placement of an event monitor but have agreed to defer this given the relatively infrequent nature of the episodes.  Stacey Roberts should contact us if she has worsening palpitations.  We will plan to continue current dose of metoprolol.  Hypertension: Blood pressure borderline with diastolic reading of 82.  We will continue current medications.  Hyperlipidemia: LDL well controlled.  Continue rosuvastatin 20 mg daily.  Morbid obesity: BMI greater than 35 with multiple comorbidities (hypertension, diabetes mellitus, coronary artery disease.  I encouraged Stacey Roberts to continue working on weight loss through diet and exercise.  Follow-up: Return to clinic in 6 months.  Nelva Bush, MD 10/29/2019 8:38 PM

## 2019-10-30 ENCOUNTER — Other Ambulatory Visit: Payer: Self-pay | Admitting: Family Medicine

## 2019-10-30 ENCOUNTER — Telehealth: Payer: Self-pay | Admitting: Oncology

## 2019-10-30 ENCOUNTER — Ambulatory Visit
Admission: EM | Admit: 2019-10-30 | Discharge: 2019-10-30 | Disposition: A | Discharge status: Home / Self Care | Payer: No Typology Code available for payment source | Attending: Family Medicine | Admitting: Family Medicine

## 2019-10-30 ENCOUNTER — Other Ambulatory Visit: Payer: Self-pay

## 2019-10-30 DIAGNOSIS — Z0189 Encounter for other specified special examinations: Secondary | ICD-10-CM | POA: Diagnosis present

## 2019-10-30 DIAGNOSIS — J029 Acute pharyngitis, unspecified: Secondary | ICD-10-CM | POA: Diagnosis not present

## 2019-10-30 LAB — POCT RAPID STREP A (OFFICE): Rapid Strep A Screen: NEGATIVE

## 2019-10-30 MED ORDER — AZITHROMYCIN 250 MG PO TABS: 250.0000 mg | ORAL_TABLET | Freq: Every day | ORAL | 0 refills | Status: DC

## 2019-10-30 NOTE — ED Triage Notes (Signed)
Patient complains of fatigue, sore throat and headaches x yesterday. Patient states that she is prone to strep. States that she was unable to sleep last night due to being uncomfortable.

## 2019-10-30 NOTE — ED Provider Notes (Signed)
[facility]   [id number] 10/30/19 Arrival Time: 4888  BV:QXIH THROAT  SUBJECTIVE: History from: patient.  Stacey Roberts is a 54 y.o. female who presents with abrupt onset of sore throat, headache, fatigue since yesterday.  States that she is prone to strep.  States that she has had her tonsils removed.  States that she was so uncomfortable last night that she could not sleep.  States she took Tylenol with little relief.  Denies sick exposure to Covid, strep, flu or mono, or precipitating event. Has negative history of Covid. Has completed Covid vaccines. Symptoms are made worse with swallowing, but tolerating liquids and own secretions without difficulty.  Denies previous symptoms in the past.     Denies fever, chills, ear pain, sinus pain, rhinorrhea, nasal congestion, cough, SOB, wheezing, chest pain, nausea, rash, changes in bowel or bladder habits.     ROS: As per HPI.  All other pertinent ROS negative.     Past Medical History:  Diagnosis Date  . Allergy    seasonal  . BRCA negative 2017  . Breast cancer ([facility]) 2014  . Breast cancer ([facility]) 2017  . Bronchitis    hx of  . Coronary artery disease 06/2019   80% mid RCA stenosis - medical management  . Diabetes mellitus, type II ([facility])   . Endometrial hyperplasia 2014   Mirena placed; Dr. Carren Rang  . GERD (gastroesophageal reflux disease)   . Headache   . History of radiation therapy 12/01/15-01/11/16   Left breast DIBH / 50.4 Gy in 28 fractions and Left breast boost / 12 Gy in 6 fractions  . Hyperlipidemia   . Hypertension   . Hypothyroidism   . Joint pain   . Obesity   . PCOS (polycystic ovarian syndrome)   . Personal history of radiation therapy 2017  . Pneumonia    as a child  . Sleep apnea 2008   severe OSA-does not use a cpap  . Snoring   . 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 Buddy Duty   Past  Surgical History:  Procedure Laterality Date  . BREAST BIOPSY  2000   s/p  . BREAST BIOPSY Left 01/22/2013   Procedure: RE-EXCISION LEFT BREAST DCIS;  Surgeon: Harl Bowie, MD;  Location: [facility];  Service: General;  Laterality: Left;  . BREAST LUMPECTOMY Left 01/06/2013   Procedure: LUMPECTOMY;  Surgeon: Harl Bowie, MD;  Location: [facility];  Service: General;  Laterality: Left;  . BREAST LUMPECTOMY Left 2017  . BREAST LUMPECTOMY WITH NEEDLE LOCALIZATION AND AXILLARY SENTINEL LYMPH NODE BX Left 09/30/2015   Procedure: Left BREAST LUMPECTOMY WITH NEEDLE LOCALIZATION AND AXILLARY SENTINEL LYMPH NODE BX;  Surgeon: Coralie Keens, MD;  Location: [facility];  Service: General;  Laterality: Left;  . BREAST RECONSTRUCTION Bilateral 10/12/2015   Procedure: BREAST oncoplastic RECONSTRUCTION;  Surgeon: Irene Limbo, MD;  Location: [facility];  Service: Plastics;  Laterality: Bilateral;  . BREAST REDUCTION SURGERY Bilateral 10/12/2015   Procedure: MAMMARY REDUCTION  (BREAST);  Surgeon: Irene Limbo, MD;  Location: [facility];  Service: Plastics;  Laterality: Bilateral;  . DILATION AND CURETTAGE OF UTERUS  2004   s/p  . EXCISION OF BREAST LESION Left 10/12/2015   Procedure: Re-EXCISION OF BREAST;  Surgeon: Coralie Keens, MD;  Location: [facility];  Service: General;  Laterality: Left;  . LEFT HEART CATH AND  CORONARY ANGIOGRAPHY N/A 06/27/2019   Procedure: LEFT HEART CATH AND CORONARY ANGIOGRAPHY;  Surgeon: Nelva Bush, MD;  Location: [facility];  Service: Cardiovascular;  Laterality: N/A;  . REDUCTION MAMMAPLASTY Bilateral 09/2015  . THYROIDECTOMY  02/2009   Dr Harlow Asa  . TONSILLECTOMY  1982   Allergies  Allergen Reactions  . Penicillins Shortness Of Breath, Rash and Other (See Comments)    Has patient had a PCN reaction causing immediate rash, facial/tongue/throat swelling, SOB or lightheadedness with  hypotension: Yes Has patient had a PCN reaction causing severe rash involving mucus membranes or skin necrosis: Yes Has patient had a PCN reaction that required hospitalization No Has patient had a PCN reaction occurring within the last 10 years: Yes If all of the above answers are "NO", then may proceed with Cephalosporin use.   . Clindamycin/Lincomycin Other (See Comments)    Severe stomach cramps  . Ace Inhibitors Other (See Comments) and Cough    REACTION: cough; denies airway involvement    No current facility-administered medications on file prior to encounter.   Current Outpatient Medications on File Prior to Encounter  Medication Sig Dispense Refill  . aspirin EC 81 MG tablet Take 1 tablet (81 mg total) by mouth daily. 90 tablet 3  . Blood Glucose Monitoring Suppl (FREESTYLE LITE) DEVI 1 each by Does not apply route 2 (two) times daily. [name] 1 each 0  . calcium carbonate (OSCAL) 1500 (600 Ca) MG TABS tablet Take 600 mg of elemental calcium by mouth daily with breakfast.     . FARXIGA 10 MG TABS tablet TAKE 10 MG BY MOUTH DAILY. 90 tablet 2  . gabapentin (NEURONTIN) 300 MG capsule Take 2 capsules (600 mg total) by mouth at bedtime. 180 capsule 4  . glucose blood (FREESTYLE LITE) test strip 1 each by Other route 2 (two) times daily. [name] 200 each 0  . Insulin Glargine (BASAGLAR KWIKPEN) 100 UNIT/ML Inject 0.5 mLs (50 Units total) into the skin every morning. 25 pen 3  . Lancets (FREESTYLE) lancets 1 each by Other route 2 (two) times daily. [name] 200 each 0  . levonorgestrel (MIRENA) 20 MCG/24HR IUD 1 each by Intrauterine route once.    Marland Kitchen levothyroxine (SYNTHROID) 137 MCG tablet Take 1 tablet (137 mcg total) by mouth daily before breakfast. 90 tablet 3  . losartan (COZAAR) 50 MG tablet TAKE 1 TABLET (50 MG TOTAL) BY MOUTH DAILY. 90 tablet 0  . metFORMIN (GLUCOPHAGE-XR) 500 MG 24 hr tablet Take 4 tablets (2,000 mg total) by mouth daily with breakfast. 360 tablet 3  . metoprolol  tartrate (LOPRESSOR) 50 MG tablet Take 1 tablet (50 mg total) by mouth 2 (two) times daily. 180 tablet 1  . Multiple Vitamins-Minerals (MULTIVITAMIN WITH MINERALS) tablet Take 1 tablet by mouth daily. Woman's Gummies    . nitroGLYCERIN (NITROSTAT) 0.4 MG SL tablet Place 1 tablet (0.4 mg total) under the tongue every 5 (five) minutes as needed for chest pain. 25 tablet prn  . oxyCODONE (OXY IR/ROXICODONE) 5 MG immediate release tablet Take 5 mg by mouth as needed.    . pantoprazole (PROTONIX) 40 MG tablet TAKE 1 TABLET (40 MG TOTAL) BY MOUTH DAILY. MUST SCHEDULE PHYSICAL EXAM (Patient taking differently: Take 40 mg by mouth daily. ) 90 tablet 3  . pregabalin (LYRICA) 75 MG capsule Take 1 capsule (75 mg total) by mouth 2 (two) times daily. (Patient taking differently: Take 75 mg by mouth daily. ) 180 capsule 3  . rosuvastatin (CRESTOR)  20 MG tablet Take 1 tablet (20 mg total) by mouth daily. 90 tablet 3  . Semaglutide,0.25 or 0.5MG/DOS, (OZEMPIC, 0.25 OR 0.5 MG/DOSE,) 2 MG/1.5ML SOPN Inject 0.5 mg into the skin once a week. 4.5 mL 3  . tamoxifen (NOLVADEX) 20 MG tablet Take 1 tablet (20 mg total) by mouth daily. 90 tablet 3   Social History   Socioeconomic History  . Marital status: Single    Spouse name: Not on file  . Number of children: Not on file  . Years of education: Not on file  . Highest education level: Not on file  Occupational History    Employer: [employer]    Comment: Outpatient scheduling in radiology  Tobacco Use  . Smoking status: Former Smoker    Packs/day: 0.50    Types: Cigarettes    Quit date: 09/29/2007    Years since quitting: 12.0  . Smokeless tobacco: Never Used  . Tobacco comment: Smoked on and off for 20 years. Quit about 8 years ago (as of 08/2015)  Vaping Use  . Vaping Use: Never used  Substance and Sexual Activity  . Alcohol use: Not Currently    Alcohol/week: 0.0 standard drinks  . Drug use: No  . Sexual activity: Not Currently    Birth  control/protection: I.U.D.  Other Topics Concern  . Not on file  Social History Narrative   The patient is divorced, moved to [place] in   2006 from [place].  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    No alcohol.  Tobacco use:  She quit 5 years ago.  She smoked on   and off for approximately 20 years.  No history of recreational drug   Use.Drinks two cups of coffee per work day.    Social Determinants of Health   Financial Resource Strain:   . Difficulty of Paying Living Expenses: Not on file  Food Insecurity:   . Worried About Charity fundraiser in the Last Year: Not on file  . Ran Out of Food in the Last Year: Not on file  Transportation Needs:   . Lack of Transportation (Medical): Not on file  . Lack of Transportation (Non-Medical): Not on file  Physical Activity:   . Days of Exercise per Week: Not on file  . Minutes of Exercise per Session: Not on file  Stress:   . Feeling of Stress : Not on file  Social Connections:   . Frequency of Communication with Friends and Family: Not on file  . Frequency of Social Gatherings with Friends and Family: Not on file  . Attends Religious Services: Not on file  . Active Member of Clubs or Organizations: Not on file  . Attends Archivist Meetings: Not on file  . Marital Status: Not on file  Intimate Partner Violence:   . Fear of Current or Ex-Partner: Not on file  . Emotionally Abused: Not on file  . Physically Abused: Not on file  . Sexually Abused: Not on file   Family History  Problem Relation Age of Onset  . Dementia Father        deceased age 60 secondary to dementia  . Bipolar disorder Father   . Emphysema Father   . Heart disease Father   . COPD Father        smoker  . Hypertension Father   . Depression Father   .  Alcoholism Father   . CAD Mother 60       Died age 79 of CAD  . Heart disease Mother        CABG  x 2  . Graves' disease Mother   . Hypertension Mother   . Thyroid disease Mother   . Heart attack Mother 46  . CAD Maternal Grandfather 60  . Heart disease Maternal Grandfather   . Heart attack Maternal Grandfather 64  . Other Sister 4       hysterectomy for fibroids  . Prostate cancer Brother        low grade; w/ surveillance  . Thyroid nodules Brother 20  . Heart disease Brother 81       CABG x 4  . Heart disease Sister        CABG x 2  . Heart attack Sister 64  . Fibroids Other        niece dx approx 31  . Hypertension Other   . Kidney cancer Cousin        maternal 1st cousin dx 55-47; former smoker  . Lung cancer Other        maternal great aunt (MGM's sister); not a smoker  . Cancer Other        nephew dx neuroblastoma at 75.50 years old  . Colon cancer Neg Hx   . Colon polyps Neg Hx     OBJECTIVE:  Vitals:   10/30/19 1045 10/30/19 1048  BP:  (!) 163/98  Pulse:  88  Resp:  18  Temp:  98.5 F (36.9 C)  TempSrc:  Oral  SpO2:  96%  Weight: 230 lb (104.3 kg)   Height: _0  (1.626 m)      General appearance: alert; appears fatigued, but nontoxic, speaking in full sentences and managing own secretions HEENT: NCAT; Ears: EACs clear, TMs pearly gray with visible cone of light, without erythema; Eyes: PERRL, EOMI grossly; Nose: no obvious rhinorrhea; Throat: oropharynx erythematous, tonsils surgically absent, uvula erythematous, inflamed and midline Neck: supple with LAD Lungs: CTA bilaterally without adventitious breath sounds; cough absent Heart: regular rate and rhythm.  Radial pulses 2+ symmetrical bilaterally Skin: warm and dry Psychological: alert and cooperative; normal mood and affect  LABS: Results for orders placed or performed during the hospital encounter of 10/30/19 (from the past 24 hour(s))  POCT rapid strep A     Status: None   Collection Time: 10/30/19 10:50 AM  Result Value Ref Range   Rapid Strep A Screen Negative Negative     ASSESSMENT &  PLAN:  1. Acute pharyngitis, unspecified etiology   2. Patient request for diagnostic testing     Meds ordered this encounter  Medications  . azithromycin (ZITHROMAX) 250 MG tablet    Sig: Take 1 tablet (250 mg total) by mouth daily. Take first 2 tablets together, then 1 every day until finished.    Dispense:  6 tablet    Refill:  0    Order Specific Question:   Supervising Provider    Answer:   Chase Picket [[id number]]   Will treat with azithromycin given clinical presentation and patient history Strep test negative, will send out for culture and we will call you with results Get plenty of rest and push fluids Take OTC Zyrtec and use chloraseptic spray as needed for throat pain. Drink warm or cool liquids, use throat lozenges, or popsicles to help alleviate symptoms Take OTC ibuprofen or tylenol as needed for pain Follow  up with PCP if symptoms persists Return or go to ER if patient has any new or worsening symptoms such as fever, chills, nausea, vomiting, worsening sore throat, cough, abdominal pain, chest pain, changes in bowel or bladder habits  Reviewed expectations re: course of current medical issues. Questions answered. Outlined signs and symptoms indicating need for more acute intervention. Patient verbalized understanding. After Visit Summary given.          Faustino Congress, NP 10/30/19 1253

## 2019-10-30 NOTE — Discharge Instructions (Signed)
Your rapid strep test is negative.  A throat culture is pending; we will call you if it is positive requiring treatment.    Going to go ahead and treat you for tonsillitis with azithromycin.  Take 2 tablets today, and then take 1 tablet daily for the next 4 days.  Your COVID test is pending.  You should self quarantine until the test result is back.    Take Tylenol as needed for fever or discomfort.  Rest and keep yourself hydrated.    Go to the emergency department if you develop acute worsening symptoms.

## 2019-10-30 NOTE — Telephone Encounter (Signed)
Per 10/7 schedule msg. Schedule 10/21 at 1030 spoke with patient she is aware

## 2019-11-01 LAB — SARS-COV-2, NAA 2 DAY TAT

## 2019-11-01 LAB — NOVEL CORONAVIRUS, NAA: SARS-CoV-2, NAA: NOT DETECTED

## 2019-11-02 LAB — CULTURE, GROUP A STREP (THRC)

## 2019-11-04 ENCOUNTER — Other Ambulatory Visit: Payer: Self-pay | Admitting: Internal Medicine

## 2019-11-05 ENCOUNTER — Encounter: Payer: Self-pay | Admitting: Internal Medicine

## 2019-11-06 ENCOUNTER — Other Ambulatory Visit: Payer: Self-pay | Admitting: Internal Medicine

## 2019-11-06 MED ORDER — LOSARTAN POTASSIUM 50 MG PO TABS
50.0000 mg | ORAL_TABLET | Freq: Every day | ORAL | 0 refills | Status: DC
Start: 2019-11-06 — End: 2020-02-19

## 2019-11-07 ENCOUNTER — Ambulatory Visit: Payer: No Typology Code available for payment source | Admitting: Oncology

## 2019-11-11 ENCOUNTER — Other Ambulatory Visit: Payer: Self-pay | Admitting: Podiatry

## 2019-11-11 NOTE — Telephone Encounter (Signed)
Please advise. Dr. Milinda Pointer is on vacation.

## 2019-11-13 ENCOUNTER — Other Ambulatory Visit: Payer: Self-pay

## 2019-11-13 ENCOUNTER — Inpatient Hospital Stay: Payer: No Typology Code available for payment source | Attending: Oncology | Admitting: Oncology

## 2019-11-13 ENCOUNTER — Other Ambulatory Visit: Payer: Self-pay | Admitting: Oncology

## 2019-11-13 ENCOUNTER — Encounter: Payer: Self-pay | Admitting: Internal Medicine

## 2019-11-13 VITALS — BP 127/95 | HR 88 | Temp 98.3°F | Resp 18 | Ht 64.0 in | Wt 229.5 lb

## 2019-11-13 DIAGNOSIS — Z975 Presence of (intrauterine) contraceptive device: Secondary | ICD-10-CM | POA: Diagnosis not present

## 2019-11-13 DIAGNOSIS — Z170 Estrogen receptor positive status [ER+]: Secondary | ICD-10-CM | POA: Diagnosis not present

## 2019-11-13 DIAGNOSIS — C50112 Malignant neoplasm of central portion of left female breast: Secondary | ICD-10-CM

## 2019-11-13 DIAGNOSIS — D0512 Intraductal carcinoma in situ of left breast: Secondary | ICD-10-CM | POA: Insufficient documentation

## 2019-11-13 DIAGNOSIS — Z79810 Long term (current) use of selective estrogen receptor modulators (SERMs): Secondary | ICD-10-CM | POA: Insufficient documentation

## 2019-11-13 DIAGNOSIS — T300 Burn of unspecified body region, unspecified degree: Secondary | ICD-10-CM

## 2019-11-13 MED ORDER — TAMOXIFEN CITRATE 20 MG PO TABS
20.0000 mg | ORAL_TABLET | Freq: Every day | ORAL | 3 refills | Status: DC
Start: 2019-11-13 — End: 2019-11-13

## 2019-11-13 NOTE — Progress Notes (Signed)
[facility]  [phone] [phone]     ID: Stacey Roberts DOB: [birth year]  MR#: [id number]  JYN#:[id number]  Patient Care Team: [name], NP as PCP - General (Internal Medicine) End, Cristal Deer, MD as PCP - Cardiology (Cardiology) Mica Releford, [name], MD as Consulting Physician (Oncology) [name], MD as Consulting Physician (General Surgery) [name], MD as Consulting Physician (Obstetrics and Gynecology) [name], MD as Consulting Physician (Radiation Oncology) [name], MD as Consulting Physician (Endocrinology) [name], MD as Consulting Physician (Gastroenterology) [name], MD as Consulting Physician (Plastic Surgery) OTHER MD:   CHIEF COMPLAINT: Estrogen receptor positive breast cancer  CURRENT TREATMENT: Tamoxifen   INTERVAL HISTORY: Stacey Roberts returns today for follow-up of her estrogen receptor positive breast cancer.  She continues on tamoxifen. She does not have so much hot flashes as sweating in the back of her knees and the band of her elbows.  This is very occasional.  She has vaginal dryness, not vaginal wetness.  Since her last visit, she underwent bilateral diagnostic mammography with tomography and left breast ultrasonography at [facility] on 09/10/2019 showing: breast density category B; 6.7 cm area of calcifications in central left breast; no evidence of malignancy on the right.  She proceeded to biopsy on 09/17/2019 of the left breast calcifications. Pathology [id number]) showed: dystrophic calcifications within fat.  She underwent breast MRI on 10/01/2019 showing: breast composition B; large area of non-enhancing fat necrosis at patient's lumpectomy site in left breast with surrounding patchy enhancement and adjacent diffuse skin thickening; no evidence right breast malignancy.  She proceeded to skin punch biopsy of the left breast on 9/282021. Pathology [id number]) showed  dermal hypersensitivity reaction.   REVIEW OF SYSTEMS: Stacey Roberts is very frustrated with her situation.  She has constant pain.  It can be lancinating, or it can be neuropathic.  She takes some OxyIR at bedtime sometimes to help her sleep.  She finds her left breast severely deformed in addition to being painful and in addition to requiring multiple studies to make sure that we are not dealing with disease recurrence.  She is pretty much fed up with this and is wanting to get the issue resolved.   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 She had left lumpectomy for that lesion 01/06/2013, showing a low-grade noninvasive ductal carcinoma which was estrogen receptor 99% and progesterone receptor 100% positive. Margins were focally positive and she had further excision 01/22/2013 which showed residual focal ductal carcinoma in situ 1 mm from the posterior margin. The patient refused adjuvant radiation and did not tolerate tamoxifen, which she took for approximately 2 months. She was last seen in our office in 2015.  Because of that history she receives diagnostic bilateral mammography yearly. Her 02/15/2015 study found the breast density to be category A. This study was negative. However in early August 2017 she developed a new left nipple discharge, which at times was bloody. Repeat left mammography with tomography and left breast ultrasonography 08/30/2015 now read the breast density to be category B. Aside some medial left breast masses and postlumpectomy findings, none of which showed any change since 2012, there were new pleomorphic calcifications posterior to the lumpectomy area 1.4 cm. On exam there was no palpable lump. Ultrasound showed scarring behind the nipple which was unchanged from prior, but no definite mass.  Biopsy of the left  breast upper outer quadrant area of calcifications  09/02/2015 showed ([id number]) ductal carcinoma in situ, grade 1. Focal invasion could not be excluded. There was not enough tissue for a prognostic panel.  On 09/10/2015 the patient underwent bilateral breast MRI with and without contrast. This again found the breast density to be category B. There was an area of non-masslike enhancement involving the upper outer quadrant of the left breast from the nipple posteriorly extending approximately 10 cm. The right breast was unremarkable. There was no evidence of adenopathy. MRI guided biopsy of the upper-outer quadrant retroareolar area of the left breast 09/16/2015 showed fat necrosis but no evidence of malignancy ([id number]).  Stacey Roberts's subsequent history is as detailed below   PAST MEDICAL HISTORY: Past Medical History:  Diagnosis Date  . Allergy    seasonal  . BRCA negative 2017  . Breast cancer (HCC) 2014  . Breast cancer (HCC) 2017  . Bronchitis    hx of  . Coronary artery disease 06/2019   80% mid RCA stenosis - medical management  . Diabetes mellitus, type II (HCC)   . Endometrial hyperplasia 2014   Mirena placed; Dr. Chevis Pretty  . GERD (gastroesophageal reflux disease)   . Headache   . History of radiation therapy 12/01/15-01/11/16   Left breast DIBH / 50.4 Gy in 28 fractions and Left breast boost / 12 Gy in 6 fractions  . Hyperlipidemia   . Hypertension   . Hypothyroidism   . Joint pain   . Obesity   . PCOS (polycystic ovarian syndrome)   . Personal history of radiation therapy 2017  . Pneumonia    as a child  . Sleep apnea 2008   severe OSA-does not use a cpap  . Snoring   . 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 Sharl Ma    PAST SURGICAL HISTORY: Past Surgical History:  Procedure Laterality Date  . BREAST BIOPSY  2000   s/p  . BREAST BIOPSY Left 01/22/2013   Procedure: RE-EXCISION LEFT BREAST DCIS;  Surgeon:  Shelly Rubenstein, MD;  Location: [facility];  Service: General;  Laterality: Left;  . BREAST LUMPECTOMY Left 01/06/2013   Procedure: LUMPECTOMY;  Surgeon: Shelly Rubenstein, MD;  Location: [facility] OR;  Service: General;  Laterality: Left;  . BREAST LUMPECTOMY Left 2017  . BREAST LUMPECTOMY WITH NEEDLE LOCALIZATION AND AXILLARY SENTINEL LYMPH NODE BX Left 09/30/2015   Procedure: Left BREAST LUMPECTOMY WITH NEEDLE LOCALIZATION AND AXILLARY SENTINEL LYMPH NODE BX;  Surgeon: [name], MD;  Location: [facility] OR;  Service: General;  Laterality: Left;  . BREAST RECONSTRUCTION Bilateral 10/12/2015   Procedure: BREAST oncoplastic RECONSTRUCTION;  Surgeon: [name], MD;  Location: [facility];  Service: Plastics;  Laterality: Bilateral;  . BREAST REDUCTION SURGERY Bilateral 10/12/2015   Procedure: MAMMARY REDUCTION  (BREAST);  Surgeon: [name], MD;  Location: [facility];  Service: Plastics;  Laterality: Bilateral;  . DILATION AND CURETTAGE OF UTERUS  2004   s/p  . EXCISION OF BREAST LESION Left 10/12/2015   Procedure: Re-EXCISION OF BREAST;  Surgeon: [name], MD;  Location: [facility];  Service: General;  Laterality: Left;  . LEFT HEART CATH AND CORONARY ANGIOGRAPHY N/A 06/27/2019   Procedure: LEFT HEART CATH AND CORONARY ANGIOGRAPHY;  Surgeon: Yvonne Kendall, MD;  Location: [facility];  Service: Cardiovascular;  Laterality: N/A;  . REDUCTION MAMMAPLASTY  Bilateral 09/2015  . THYROIDECTOMY  02/2009   Dr Gerrit Friends  . TONSILLECTOMY  1982    FAMILY HISTORY Family History  Problem Relation Age of Onset  . Dementia Father        deceased age 6 secondary to dementia  . Bipolar disorder Father   . Emphysema Father   . Heart disease Father   . COPD Father        smoker  . Hypertension Father   . Depression Father   . Alcoholism Father   . CAD Mother 94       Died age 48 of CAD  . Heart disease Mother        CABG  x 2  . Graves' disease Mother   . Hypertension Mother   . Thyroid disease Mother   . Heart attack Mother 34  . CAD Maternal Grandfather 60  . Heart disease Maternal Grandfather   . Heart attack Maternal Grandfather 64  . Other Sister 47       hysterectomy for fibroids  . Prostate cancer Brother        low grade; w/ surveillance  . Thyroid nodules Brother 59  . Heart disease Brother 46       CABG x 4  . Heart disease Sister        CABG x 2  . Heart attack Sister 15  . Fibroids Other        niece dx approx 36  . Hypertension Other   . Kidney cancer Cousin        maternal 1st cousin dx 60-47; former smoker  . Lung cancer Other        maternal great aunt (MGM's sister); not a smoker  . Cancer Other        nephew dx neuroblastoma at 35.7 years old  . Colon cancer Neg Hx   . Colon polyps Neg Hx   The patient's father died at the age of 28 from myocardial infarction in the setting of dementia. The patient's mother died at the age of 31 from heart disease. Ajayla has 2 brothers, 2 sisters. There is no history of breast ovarian or colon cancer in the family. Of course she has a history of thyroid cancer   GYNECOLOGIC HISTORY:  No LMP recorded. (Menstrual status: IUD). Menarche age 16, she is GX P0. She has a Mirena in place.   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 She is divorced. The patient is not a church attender.    ADVANCED DIRECTIVES: the patient has named her sister Stacey Roberts as her healthcare power of attorney   HEALTH MAINTENANCE: Social History   Tobacco Use  . Smoking status: Former Smoker    Packs/day: 0.50    Types: Cigarettes    Quit date: 09/29/2007    Years since quitting: 12.1  . Smokeless tobacco: Never Used  . Tobacco comment: Smoked on and off for 20 years. Quit about 8 years ago (as of 08/2015)  Vaping Use  . Vaping Use: Never used  Substance Use Topics  . Alcohol use: Not Currently    Alcohol/week: 0.0  standard drinks  . Drug use: No     Colonoscopy:  PAP:  Bone density:   Allergies  Allergen Reactions  . Penicillins Shortness Of Breath, Rash and Other (See Comments)    Has patient had a PCN reaction causing immediate rash, facial/tongue/throat swelling, SOB or lightheadedness with hypotension: Yes Has patient had a  PCN reaction causing severe rash involving mucus membranes or skin necrosis: Yes Has patient had a PCN reaction that required hospitalization No Has patient had a PCN reaction occurring within the last 10 years: Yes If all of the above answers are "NO", then may proceed with Cephalosporin use.   . Clindamycin/Lincomycin Other (See Comments)    Severe stomach cramps  . Ace Inhibitors Other (See Comments) and Cough    REACTION: cough; denies airway involvement     Current Outpatient Medications  Medication Sig Dispense Refill  . aspirin EC 81 MG tablet Take 1 tablet (81 mg total) by mouth daily. 90 tablet 3  . azithromycin (ZITHROMAX) 250 MG tablet Take 1 tablet (250 mg total) by mouth daily. Take first 2 tablets together, then 1 every day until finished. 6 tablet 0  . Blood Glucose Monitoring Suppl (FREESTYLE LITE) DEVI 1 each by Does not apply route 2 (two) times daily. [name] 1 each 0  . calcium carbonate (OSCAL) 1500 (600 Ca) MG TABS tablet Take 600 mg of elemental calcium by mouth daily with breakfast.     . FARXIGA 10 MG TABS tablet TAKE 10 MG BY MOUTH DAILY. 90 tablet 2  . gabapentin (NEURONTIN) 300 MG capsule Take 2 capsules (600 mg total) by mouth at bedtime. 180 capsule 4  . glucose blood (FREESTYLE LITE) test strip 1 each by Other route 2 (two) times daily. [name] 200 each 0  . Insulin Glargine (BASAGLAR KWIKPEN) 100 UNIT/ML Inject 0.5 mLs (50 Units total) into the skin every morning. 25 pen 3  . Lancets (FREESTYLE) lancets 1 each by Other route 2 (two) times daily. [name] 200 each 0  . levonorgestrel (MIRENA) 20 MCG/24HR IUD 1 each by Intrauterine route once.     Marland Kitchen levothyroxine (SYNTHROID) 137 MCG tablet Take 1 tablet (137 mcg total) by mouth daily before breakfast. 90 tablet 3  . losartan (COZAAR) 50 MG tablet Take 1 tablet (50 mg total) by mouth daily. 90 tablet 0  . metFORMIN (GLUCOPHAGE-XR) 500 MG 24 hr tablet Take 4 tablets (2,000 mg total) by mouth daily with breakfast. 360 tablet 3  . metoprolol tartrate (LOPRESSOR) 50 MG tablet Take 1 tablet (50 mg total) by mouth 2 (two) times daily. 180 tablet 1  . Multiple Vitamins-Minerals (MULTIVITAMIN WITH MINERALS) tablet Take 1 tablet by mouth daily. Woman's Gummies    . nitroGLYCERIN (NITROSTAT) 0.4 MG SL tablet Place 1 tablet (0.4 mg total) under the tongue every 5 (five) minutes as needed for chest pain. 25 tablet prn  . oxyCODONE (OXY IR/ROXICODONE) 5 MG immediate release tablet Take 5 mg by mouth as needed.    . pantoprazole (PROTONIX) 40 MG tablet TAKE 1 TABLET (40 MG TOTAL) BY MOUTH DAILY. MUST SCHEDULE PHYSICAL EXAM (Patient taking differently: Take 40 mg by mouth daily. ) 90 tablet 3  . pregabalin (LYRICA) 75 MG capsule Take 1 capsule (75 mg total) by mouth 2 (two) times daily. (Patient taking differently: Take 75 mg by mouth daily. ) 180 capsule 3  . rosuvastatin (CRESTOR) 20 MG tablet Take 1 tablet (20 mg total) by mouth daily. 90 tablet 3  . Semaglutide,0.25 or 0.5MG /DOS, (OZEMPIC, 0.25 OR 0.5 MG/DOSE,) 2 MG/1.5ML SOPN Inject 0.5 mg into the skin once a week. 4.5 mL 3  . tamoxifen (NOLVADEX) 20 MG tablet Take 1 tablet (20 mg total) by mouth daily. 90 tablet 3   No current facility-administered medications for this visit.    OBJECTIVE: white woman who appears stated  age  Vitals:   11/13/19 1024  BP: (!) 127/95  Pulse: 88  Resp: 18  Temp: 98.3 F (36.8 C)  SpO2: 96%     Body mass index is 39.39 kg/m.    ECOG FS:1 - Symptomatic but completely ambulatory  Sclerae unicteric, EOMs intact Wearing a mask No cervical or supraclavicular adenopathy Lungs no rales or rhonchi Heart regular  rate and rhythm Abd soft, nontender, positive bowel sounds MSK no focal spinal tenderness, no upper extremity lymphedema Neuro: nonfocal, well oriented, appropriate affect Breasts: The right breast is unremarkable.  The left breast is status post surgery and radiation.  There is significant deformity of the breast contour, with the nipple for example aiming laterally.  There is significant induration and some erythema as well as skin thickening.  Both axillae are benign.   Left breast 08/28/2019    LAB RESULTS:  CMP     Component Value Date/Time   NA 139 08/28/2019 0852   NA 138 03/21/2017 1018   NA 137 02/18/2013 1554   K 4.5 08/28/2019 0852   K 4.4 02/18/2013 1554   CL 103 08/28/2019 0852   CO2 25 08/28/2019 0852   CO2 25 02/18/2013 1554   GLUCOSE 274 (H) 08/28/2019 0852   GLUCOSE 300 (H) 02/18/2013 1554   BUN 11 08/28/2019 0852   BUN 16 03/21/2017 1018   BUN 14.2 02/18/2013 1554   CREATININE 0.81 08/28/2019 0852   CREATININE 0.67 07/16/2017 1442   CREATININE 0.8 02/18/2013 1554   CALCIUM 9.3 08/28/2019 0852   CALCIUM 9.2 02/18/2013 1554   PROT 6.9 08/28/2019 0852   PROT 6.5 03/21/2017 1018   PROT 7.2 02/18/2013 1554   ALBUMIN 3.4 (L) 08/28/2019 0852   ALBUMIN 3.7 03/21/2017 1018   ALBUMIN 3.6 02/18/2013 1554   AST 11 (L) 08/28/2019 0852   AST 12 07/16/2017 1442   AST 10 02/18/2013 1554   ALT 13 08/28/2019 0852   ALT 12 07/16/2017 1442   ALT 16 02/18/2013 1554   ALKPHOS 84 08/28/2019 0852   ALKPHOS 85 02/18/2013 1554   BILITOT 0.4 08/28/2019 0852   BILITOT 0.3 07/16/2017 1442   BILITOT 0.33 02/18/2013 1554   GFRNONAA >60 08/28/2019 0852   GFRNONAA >60 07/16/2017 1442   GFRAA >60 08/28/2019 0852   GFRAA >60 07/16/2017 1442    INo results found for: SPEP, UPEP  Lab Results  Component Value Date   WBC 7.8 08/28/2019   NEUTROABS 5.6 08/28/2019   HGB 13.6 08/28/2019   HCT 42.3 08/28/2019   MCV 86.3 08/28/2019   PLT 241 08/28/2019      Chemistry        Component Value Date/Time   NA 139 08/28/2019 0852   NA 138 03/21/2017 1018   NA 137 02/18/2013 1554   K 4.5 08/28/2019 0852   K 4.4 02/18/2013 1554   CL 103 08/28/2019 0852   CO2 25 08/28/2019 0852   CO2 25 02/18/2013 1554   BUN 11 08/28/2019 0852   BUN 16 03/21/2017 1018   BUN 14.2 02/18/2013 1554   CREATININE 0.81 08/28/2019 0852   CREATININE 0.67 07/16/2017 1442   CREATININE 0.8 02/18/2013 1554      Component Value Date/Time   CALCIUM 9.3 08/28/2019 0852   CALCIUM 9.2 02/18/2013 1554   ALKPHOS 84 08/28/2019 0852   ALKPHOS 85 02/18/2013 1554   AST 11 (L) 08/28/2019 0852   AST 12 07/16/2017 1442   AST 10 02/18/2013 1554   ALT 13 08/28/2019  0852   ALT 12 07/16/2017 1442   ALT 16 02/18/2013 1554   BILITOT 0.4 08/28/2019 0852   BILITOT 0.3 07/16/2017 1442   BILITOT 0.33 02/18/2013 1554       No results found for: LABCA2  No components found for: LABCA125  No results for input(s): INR in the last 168 hours.  Urinalysis    Component Value Date/Time   COLORURINE YELLOW 03/15/2009 0835   APPEARANCEUR CLOUDY (A) 03/15/2009 0835   LABSPEC 1.024 03/15/2009 0835   PHURINE 7.5 03/15/2009 0835   GLUCOSEU NEGATIVE 03/15/2009 0835   GLUCOSEU NEGATIVE 01/09/2008 1524   HGBUR NEGATIVE 03/15/2009 0835   BILIRUBINUR NEGATIVE 03/15/2009 0835   KETONESUR NEGATIVE 03/15/2009 0835   PROTEINUR NEGATIVE 03/15/2009 0835   UROBILINOGEN 1.0 03/15/2009 0835   NITRITE NEGATIVE 03/15/2009 0835   LEUKOCYTESUR TRACE (A) 03/15/2009 0835    STUDIES: No results found.   ELIGIBLE FOR AVAILABLE RESEARCH PROTOCOL: No   ASSESSMENT: 54 y.o. BRCA negative Stacey Roberts,  woman   (1) status post central left breast lumpectomy 01/06/2013 for a 1.5 cm ductal carcinoma in situ, grade 1, estrogen receptor 99% positive, progesterone receptor 100% positive, with a focally positive margin  (2) status post additional surgery for margin clearance 01/22/2013 showed the posterior margin negative at 0.1  cm  (3) the patient decided against adjuvant radiation; she was supposed to have started tamoxifen May 2015,  (4) left breast upper outer quadrant biopsy 09/02/2015 finds ductal carcinoma in situ, grade 1, with not enough tissue for prognostic panel  (a) upper outer quadrant biopsy retroareolar biopsy 09/16/2015, found to fibroadipose tissue and fat necrosis but no evidence of malignancy  (5) genetics testing 09/21/2015 through the [facility] Common Hereditary Cancers Panel (Breast, Gyn, GI) performed by [facility], [place]) found no deleterious mutations in APC, ATM, AXIN2, BARD1, BMPR1A, BRCA1, BRCA2, BRIP1, CDH1, CDKN2A, CHEK2, DICER1, EPCAM, GREM1, KIT, MEN1, MLH1, MSH2, MSH6, MUTYH, NBN, NF1, PALB2, PDGFRA, PMS2, POLD1, POLE, PTEN, RAD50, RAD51C, RAD51D, SDHA, SDHB, SDHC, SDHD, SMAD4, SMARCA4, STK11, TP53, TSC1, TSC2, and VHL  (6) repeat left lumpectomy and sentinel node biopsyy 10/12/2015 documented a pT2  PN0, stage IIA  Invasive ductal carcinoma, grade 1, with a focally positive margin; estrogen and progesterone receptor positive, HER-2 nonamplified, with an MIB-1 of 3%.  (a)  Left breast reexcision with bilateral oncoplastic reconstruction 10/12/2015 achieved clear margins, with no evidence of malignancy  (7) Oncotype DX score of 3 predicts a risk of recurrence outside the breast in 10 years of 4% if the patient's only systemic therapy is tamoxifen for 5 years. It also predicts no benefit from chemotherapy.  (8) adjuvant radiation11/08/17-12/19/17 Site/dose:  1) Left breast DIBH / 50.4 Gy in 28 fractions                         2) Left breast boost / 12 Gy in 6 fractions  (8) tamoxifen started 03/06/2016  (a) Mirena IUD in place  (9) status post total thyroidectomy 03/18/2018 for a follicular variant of papillary thyroid carcinoma, measuring 1.7 cm, confined within thyroid parenchyma but with angiolymphatic invasion and capsular invasion identified, pT1b pNX  (a) note  normal PTEN above   PLAN: [name] is just over 4 years out from definitive surgery for her left breast cancer with no evidence of disease recurrence.  This is very favorable.  She is tolerating tamoxifen well and the plan is to continue that for a total of 5 years.  She is very dissatisfied with her left breast surgery results.  She also has significant pain associated with this.  She would like to have the issue resolved.  I was going to put in a referral up to Dr. Ulice Bold with: Plastics but the patient tells me because of her insurance that referral will have to be placed by her primary care physician.  In terms of pain I suggested that she try Aleve 220 mg together with Tylenol 500 mg up to 3 times a day.  At night she can take gabapentin 600 mg which I think will be helpful in terms of getting her through the night to with decrease in the neuropathic pain  Going to see her in 6 months.  She knows to call for any other issue that may develop before then  Total encounter time 30 minutes.*    Aamilah Augenstein, [name], MD  11/13/19 10:45 AM Medical Oncology and Hematology [facility] [address] [place], [place] [address] Tel. [phone]    Fax. [phone]   I, Mickie Bail, am acting as scribe for Dr. [name]. [place].  I, Ruthann Cancer MD, have reviewed the above documentation for accuracy and completeness, and I agree with the above.   *Total Encounter Time as defined by the Centers for Medicare and Medicaid Services includes, in addition to the face-to-face time of a patient visit (documented in the note above) non-face-to-face time: obtaining and reviewing outside history, ordering and reviewing medications, tests or procedures, care coordination (communications with other health care professionals or caregivers) and documentation in the medical record.

## 2019-11-24 ENCOUNTER — Encounter: Payer: Self-pay | Admitting: Podiatry

## 2019-11-25 ENCOUNTER — Other Ambulatory Visit: Payer: Self-pay | Admitting: Podiatry

## 2019-11-25 ENCOUNTER — Telehealth: Payer: Self-pay | Admitting: *Deleted

## 2019-11-25 MED ORDER — PREGABALIN 75 MG PO CAPS
75.0000 mg | ORAL_CAPSULE | Freq: Two times a day (BID) | ORAL | 3 refills | Status: DC
Start: 2019-11-25 — End: 2019-11-25

## 2019-11-27 NOTE — Telephone Encounter (Signed)
Meds refilled.

## 2019-12-11 ENCOUNTER — Other Ambulatory Visit: Payer: Self-pay | Admitting: Internal Medicine

## 2019-12-12 ENCOUNTER — Other Ambulatory Visit: Payer: Self-pay | Admitting: Internal Medicine

## 2019-12-31 ENCOUNTER — Ambulatory Visit: Payer: No Typology Code available for payment source | Admitting: Endocrinology

## 2020-01-02 ENCOUNTER — Other Ambulatory Visit: Payer: Self-pay | Admitting: Surgery

## 2020-01-02 ENCOUNTER — Encounter: Payer: Self-pay | Admitting: Plastic Surgery

## 2020-01-02 ENCOUNTER — Other Ambulatory Visit: Payer: Self-pay

## 2020-01-02 ENCOUNTER — Ambulatory Visit (INDEPENDENT_AMBULATORY_CARE_PROVIDER_SITE_OTHER): Payer: No Typology Code available for payment source | Admitting: Plastic Surgery

## 2020-01-02 VITALS — BP 138/84 | HR 91 | Temp 98.3°F | Ht 64.0 in | Wt 231.0 lb

## 2020-01-02 DIAGNOSIS — N6489 Other specified disorders of breast: Secondary | ICD-10-CM | POA: Diagnosis not present

## 2020-01-02 DIAGNOSIS — Z853 Personal history of malignant neoplasm of breast: Secondary | ICD-10-CM | POA: Diagnosis not present

## 2020-01-02 DIAGNOSIS — E1142 Type 2 diabetes mellitus with diabetic polyneuropathy: Secondary | ICD-10-CM

## 2020-01-02 DIAGNOSIS — Z923 Personal history of irradiation: Secondary | ICD-10-CM | POA: Insufficient documentation

## 2020-01-02 NOTE — Progress Notes (Signed)
Patient ID: Stacey Roberts, female    DOB: [birth year], 54 y.o.   MRN: [id number]   Chief Complaint  Patient presents with  . Breast Problem    The patient is a 54 year old female for evaluation of her breasts.  She was diagnosed with left breast cancer in 2014.  She had a partial mastectomy.  The work-up began because she started having bloody drainage from her left nipple.  She then had some issues in 2017 and had partial mastectomy again with partial tissue rearrangement by Dr. Iran Planas.  She was radiated which ended in 2017.  She has hyperlipidemia, thyroid disease and diabetes.  Her last hemoglobin A1c was 9.8.  She is 5 feet 4 inches tall and weighs 231 pounds.  Her preoperative bra size is a 42 C.  She does not have a family history of breast cancer.  Over the past 6 months she has noticed increase pain of the left breast with extreme distortion and contour toward irregularity.  There was concern for recurrence due to the irregularity in the way that it looks.  She underwent imaging and biopsy.  All has been negative so far.  This area is very concerning.  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  This is very concerning for a possible hidden tissue irregularity or even cancer.  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  It is very tough and hard to touch.  It is not something that would improve with massage.  She still has both nipple areolas.   Review of Systems  Constitutional: Negative.   HENT: Negative.   Eyes: Negative.   Respiratory: Negative.  Negative for shortness of breath.   Cardiovascular: Negative for leg swelling.  Gastrointestinal: Negative for abdominal distention and abdominal pain.  Endocrine: Negative.   Genitourinary: Negative.   Musculoskeletal: Negative.   Psychiatric/Behavioral: Negative.   Left breast pain  Past  Medical History:  Diagnosis Date  . Allergy    seasonal  . BRCA negative 2017  . Breast cancer ([place]) 2014  . Breast cancer ([place]) 2017  . Bronchitis    hx of  . Coronary artery disease 06/2019   80% mid RCA stenosis - medical management  . Diabetes mellitus, type II ([place])   . Endometrial hyperplasia 2014   Mirena placed; Dr. Carren Rang  . GERD (gastroesophageal reflux disease)   . Headache   . History of radiation therapy 12/01/15-01/11/16   Left breast DIBH / 50.4 Gy in 28 fractions and Left breast boost / 12 Gy in 6 fractions  . Hyperlipidemia   . Hypertension   . Hypothyroidism   . Joint pain   . Obesity   . PCOS (polycystic ovarian syndrome)   . Personal history of radiation therapy 2017  . Pneumonia    as a child  . Sleep apnea 2008   severe OSA-does not use a cpap  . Snoring   . 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 Buddy Duty    Past Surgical History:  Procedure Laterality Date  . BREAST BIOPSY  2000   s/p  . BREAST BIOPSY Left 01/22/2013   Procedure: RE-EXCISION LEFT BREAST DCIS;  Surgeon: Harl Bowie, MD;  Location: [facility];  Service: General;  Laterality: Left;  .  BREAST LUMPECTOMY Left 01/06/2013   Procedure: LUMPECTOMY;  Surgeon: Harl Bowie, MD;  Location: [facility];  Service: General;  Laterality: Left;  . BREAST LUMPECTOMY Left 2017  . BREAST LUMPECTOMY WITH NEEDLE LOCALIZATION AND AXILLARY SENTINEL LYMPH NODE BX Left 09/30/2015   Procedure: Left BREAST LUMPECTOMY WITH NEEDLE LOCALIZATION AND AXILLARY SENTINEL LYMPH NODE BX;  Surgeon: Coralie Keens, MD;  Location: [facility];  Service: General;  Laterality: Left;  . BREAST RECONSTRUCTION Bilateral 10/12/2015   Procedure: BREAST oncoplastic RECONSTRUCTION;  Surgeon: Irene Limbo, MD;  Location: [facility];  Service: Plastics;  Laterality: Bilateral;  . BREAST REDUCTION  SURGERY Bilateral 10/12/2015   Procedure: MAMMARY REDUCTION  (BREAST);  Surgeon: Irene Limbo, MD;  Location: [facility];  Service: Plastics;  Laterality: Bilateral;  . DILATION AND CURETTAGE OF UTERUS  2004   s/p  . EXCISION OF BREAST LESION Left 10/12/2015   Procedure: Re-EXCISION OF BREAST;  Surgeon: Coralie Keens, MD;  Location: [facility];  Service: General;  Laterality: Left;  . LEFT HEART CATH AND CORONARY ANGIOGRAPHY N/A 06/27/2019   Procedure: LEFT HEART CATH AND CORONARY ANGIOGRAPHY;  Surgeon: Nelva Bush, MD;  Location: [facility];  Service: Cardiovascular;  Laterality: N/A;  . REDUCTION MAMMAPLASTY Bilateral 09/2015  . THYROIDECTOMY  02/2009   Dr Harlow Asa  . TONSILLECTOMY  1982      Current Outpatient Medications:  .  aspirin EC 81 MG tablet, Take 1 tablet (81 mg total) by mouth daily., Disp: 90 tablet, Rfl: 3 .  azithromycin (ZITHROMAX) 250 MG tablet, Take 1 tablet (250 mg total) by mouth daily. Take first 2 tablets together, then 1 every day until finished., Disp: 6 tablet, Rfl: 0 .  Blood Glucose Monitoring Suppl (FREESTYLE LITE) DEVI, 1 each by Does not apply route 2 (two) times daily. [name], Disp: 1 each, Rfl: 0 .  calcium carbonate (OSCAL) 1500 (600 Ca) MG TABS tablet, Take 600 mg of elemental calcium by mouth daily with breakfast. , Disp: , Rfl:  .  FARXIGA 10 MG TABS tablet, TAKE 10 MG BY MOUTH DAILY., Disp: 90 tablet, Rfl: 2 .  gabapentin (NEURONTIN) 300 MG capsule, Take 2 capsules (600 mg total) by mouth at bedtime., Disp: 180 capsule, Rfl: 4 .  glucose blood (FREESTYLE LITE) test strip, 1 each by Other route 2 (two) times daily. [name], Disp: 200 each, Rfl: 0 .  Insulin Glargine (BASAGLAR KWIKPEN) 100 UNIT/ML, Inject 0.5 mLs (50 Units total) into the skin every morning., Disp: 25 pen, Rfl: 3 .  Lancets (FREESTYLE) lancets, 1 each by Other route 2 (two) times daily. [name], Disp: 200 each, Rfl: 0 .  levonorgestrel (MIRENA) 20  MCG/24HR IUD, 1 each by Intrauterine route once., Disp: , Rfl:  .  levothyroxine (SYNTHROID) 137 MCG tablet, Take 1 tablet (137 mcg total) by mouth daily before breakfast., Disp: 90 tablet, Rfl: 3 .  losartan (COZAAR) 50 MG tablet, Take 1 tablet (50 mg total) by mouth daily., Disp: 90 tablet, Rfl: 0 .  metFORMIN (GLUCOPHAGE-XR) 500 MG 24 hr tablet, Take 4 tablets (2,000 mg total) by mouth daily with breakfast., Disp: 360 tablet, Rfl: 3 .  metoprolol tartrate (LOPRESSOR) 50 MG tablet, Take 1 tablet (50 mg total) by mouth 2 (two) times daily., Disp: 180 tablet, Rfl: 1 .  Multiple Vitamins-Minerals (MULTIVITAMIN WITH MINERALS) tablet, Take 1 tablet by mouth daily. Woman's Gummies, Disp: , Rfl:  .  nitroGLYCERIN (NITROSTAT) 0.4 MG SL tablet, Place 1 tablet (  0.4 mg total) under the tongue every 5 (five) minutes as needed for chest pain., Disp: 25 tablet, Rfl: prn .  oxyCODONE (OXY IR/ROXICODONE) 5 MG immediate release tablet, Take 5 mg by mouth as needed., Disp: , Rfl:  .  pantoprazole (PROTONIX) 40 MG tablet, TAKE 1 TABLET BY MOUTH DAILY., Disp: 90 tablet, Rfl: 0 .  pregabalin (LYRICA) 75 MG capsule, Take 1 capsule (75 mg total) by mouth 2 (two) times daily., Disp: 180 capsule, Rfl: 3 .  rosuvastatin (CRESTOR) 20 MG tablet, Take 1 tablet (20 mg total) by mouth daily., Disp: 90 tablet, Rfl: 3 .  Semaglutide,0.25 or 0.5MG/DOS, (OZEMPIC, 0.25 OR 0.5 MG/DOSE,) 2 MG/1.5ML SOPN, Inject 0.5 mg into the skin once a week., Disp: 4.5 mL, Rfl: 3 .  tamoxifen (NOLVADEX) 20 MG tablet, Take 1 tablet (20 mg total) by mouth daily., Disp: 90 tablet, Rfl: 3   Objective:   Vitals:   01/02/20 0928  BP: 138/84  Pulse: 91  Temp: 98.3 F (36.8 C)  SpO2: 98%    Physical Exam Vitals and nursing note reviewed.  Constitutional:      Appearance: Normal appearance. She is obese.  HENT:     Head: Normocephalic and atraumatic.  Eyes:     Extraocular Movements: Extraocular movements intact.  Cardiovascular:     Rate  and Rhythm: Normal rate.     Pulses: Normal pulses.  Pulmonary:     Effort: Pulmonary effort is normal. No respiratory distress.  Abdominal:     General: Abdomen is flat. There is no distension.     Tenderness: There is no abdominal tenderness.  Skin:    General: Skin is warm.     Capillary Refill: Capillary refill takes less than 2 seconds.  Neurological:     General: No focal deficit present.     Mental Status: She is alert and oriented to person, place, and time.  Psychiatric:        Mood and Affect: Mood normal.        Behavior: Behavior normal.        Thought Content: Thought content normal.     Assessment & Plan:  Type 2 diabetes mellitus with diabetic polyneuropathy, unspecified whether long term insulin use (HCC)  History of cancer of left breast  History of radiation exposure  Breast asymmetry  We discussed the options for waiting and watching.  This is very concerning the way the left breast is distorting and contracting.  At this point I think a full excision of the damaged area of the left lateral breast with latissimus muscle flap is her best option for halting this continued distraction I have discussed this with Dr. Ninfa Linden and he is in agreement.    We can work with him for the partial mastectomy and reconstruction with latissimus muscle flap.  This may require a additional procedure with liposuction or Lipo filling.  The patient is in agreement.  We will plan on a telemetry visit for further discussion but in the meantime we will get the submitted for preauthorization.  Pictures were obtained of the patient and placed in the chart with the patient's or guardian's permission.   [place], DO

## 2020-01-05 ENCOUNTER — Other Ambulatory Visit: Payer: Self-pay | Admitting: Endocrinology

## 2020-01-07 ENCOUNTER — Encounter: Payer: Self-pay | Admitting: Internal Medicine

## 2020-01-07 DIAGNOSIS — N6489 Other specified disorders of breast: Secondary | ICD-10-CM

## 2020-01-21 ENCOUNTER — Encounter: Payer: Self-pay | Admitting: Podiatry

## 2020-01-21 ENCOUNTER — Ambulatory Visit: Payer: No Typology Code available for payment source | Admitting: Podiatry

## 2020-01-21 ENCOUNTER — Other Ambulatory Visit: Payer: Self-pay

## 2020-01-21 DIAGNOSIS — E1142 Type 2 diabetes mellitus with diabetic polyneuropathy: Secondary | ICD-10-CM

## 2020-01-21 NOTE — Progress Notes (Signed)
She presents today for follow-up of her diabetic foot.  She states that her [name] was 10 point something in her diabetes doctor is not very happy with her.  She denies fever chills nausea vomiting muscle aches pains calf pain back pain chest pain shortness of breath.  She does also deny any burning or tingling in her feet or fingers.  She denies any changes in her vision which she states she she just had checked and that there are no changes in her kidney function according to her.  She states that she is having no problems with her feet other than occasional pain of her fifth metatarsal right foot.  Objective: Vital signs are stable she is alert oriented x3 pulses are palpable.  Neurologic sensorium is intact.  Detailed reflexes are intact.  Muscle strength is normal symmetrical bilateral.  Orthopedic evaluation observed all joints distal to the ankle full range of motion without crepitation.  He has mild tenderness on palpation of the fifth metatarsal of the right foot.  There is no edema erythema cellulitis drainage or odor associated in this area.  No open lesions or wounds.  Assessment: Poorly controlled diabetes currently with no diabetic complications associated with her feet.  Plan: Continue application of lotions and creams continue to check her feet on a daily basis and follow-up with me should she have questions or concerns regarding increase in pain of her right foot.  Follow-up with me in 1 year if there are no concerns

## 2020-02-03 ENCOUNTER — Encounter: Payer: Self-pay | Admitting: Internal Medicine

## 2020-02-03 ENCOUNTER — Encounter: Payer: Self-pay | Admitting: Endocrinology

## 2020-02-03 ENCOUNTER — Ambulatory Visit (INDEPENDENT_AMBULATORY_CARE_PROVIDER_SITE_OTHER): Payer: No Typology Code available for payment source | Admitting: Internal Medicine

## 2020-02-03 ENCOUNTER — Other Ambulatory Visit: Payer: Self-pay

## 2020-02-03 ENCOUNTER — Ambulatory Visit: Payer: No Typology Code available for payment source | Admitting: Endocrinology

## 2020-02-03 VITALS — BP 142/88 | HR 86 | Ht 64.0 in | Wt 232.0 lb

## 2020-02-03 VITALS — BP 132/84 | HR 86 | Temp 97.0°F | Ht 64.0 in | Wt 231.0 lb

## 2020-02-03 DIAGNOSIS — F419 Anxiety disorder, unspecified: Secondary | ICD-10-CM

## 2020-02-03 DIAGNOSIS — Z0001 Encounter for general adult medical examination with abnormal findings: Secondary | ICD-10-CM | POA: Diagnosis not present

## 2020-02-03 DIAGNOSIS — Z794 Long term (current) use of insulin: Secondary | ICD-10-CM

## 2020-02-03 DIAGNOSIS — I10 Essential (primary) hypertension: Secondary | ICD-10-CM | POA: Diagnosis not present

## 2020-02-03 DIAGNOSIS — E785 Hyperlipidemia, unspecified: Secondary | ICD-10-CM

## 2020-02-03 DIAGNOSIS — C73 Malignant neoplasm of thyroid gland: Secondary | ICD-10-CM | POA: Diagnosis not present

## 2020-02-03 DIAGNOSIS — Z170 Estrogen receptor positive status [ER+]: Secondary | ICD-10-CM

## 2020-02-03 DIAGNOSIS — K219 Gastro-esophageal reflux disease without esophagitis: Secondary | ICD-10-CM | POA: Diagnosis not present

## 2020-02-03 DIAGNOSIS — F32A Depression, unspecified: Secondary | ICD-10-CM

## 2020-02-03 DIAGNOSIS — E890 Postprocedural hypothyroidism: Secondary | ICD-10-CM

## 2020-02-03 DIAGNOSIS — C50112 Malignant neoplasm of central portion of left female breast: Secondary | ICD-10-CM

## 2020-02-03 DIAGNOSIS — E1142 Type 2 diabetes mellitus with diabetic polyneuropathy: Secondary | ICD-10-CM

## 2020-02-03 DIAGNOSIS — I25118 Atherosclerotic heart disease of native coronary artery with other forms of angina pectoris: Secondary | ICD-10-CM

## 2020-02-03 DIAGNOSIS — Z1159 Encounter for screening for other viral diseases: Secondary | ICD-10-CM | POA: Diagnosis not present

## 2020-02-03 LAB — CBC
HCT: 42.7 % (ref 36.0–46.0)
Hemoglobin: 14 g/dL (ref 12.0–15.0)
MCHC: 32.7 g/dL (ref 30.0–36.0)
MCV: 84.7 fl (ref 78.0–100.0)
Platelets: 220 10*3/uL (ref 150.0–400.0)
RBC: 5.04 Mil/uL (ref 3.87–5.11)
RDW: 14.8 % (ref 11.5–15.5)
WBC: 7.6 10*3/uL (ref 4.0–10.5)

## 2020-02-03 LAB — COMPREHENSIVE METABOLIC PANEL
ALT: 15 U/L (ref 0–35)
AST: 13 U/L (ref 0–37)
Albumin: 3.9 g/dL (ref 3.5–5.2)
Alkaline Phosphatase: 75 U/L (ref 39–117)
BUN: 14 mg/dL (ref 6–23)
CO2: 31 mEq/L (ref 19–32)
Calcium: 9.3 mg/dL (ref 8.4–10.5)
Chloride: 104 mEq/L (ref 96–112)
Creatinine, Ser: 0.6 mg/dL (ref 0.40–1.20)
GFR: 101.33 mL/min (ref >60.00)
Glucose, Bld: 205 mg/dL — ABNORMAL HIGH (ref 70–99)
Potassium: 4.7 mEq/L (ref 3.5–5.1)
Sodium: 140 mEq/L (ref 135–145)
Total Bilirubin: 0.5 mg/dL (ref 0.2–1.2)
Total Protein: 6.6 g/dL (ref 6.0–8.3)

## 2020-02-03 LAB — LIPID PANEL
Cholesterol: 121 mg/dL (ref 0–200)
HDL: 51.8 mg/dL (ref >39.00)
LDL Cholesterol: 46 mg/dL (ref 0–99)
NonHDL: 68.77
Total CHOL/HDL Ratio: 2
Triglycerides: 115 mg/dL (ref 0.0–149.0)
VLDL: 23 mg/dL (ref 0.0–40.0)

## 2020-02-03 LAB — HEMOGLOBIN A1C: Hgb A1c MFr Bld: 9.9 % — ABNORMAL HIGH (ref 4.6–6.5)

## 2020-02-03 LAB — T4, FREE: Free T4: 1.36 ng/dL (ref 0.60–1.60)

## 2020-02-03 LAB — POCT GLYCOSYLATED HEMOGLOBIN (HGB A1C): Hemoglobin A1C: 10.1 % — AB (ref 4.0–5.6)

## 2020-02-03 LAB — TSH: TSH: 0.41 u[IU]/mL (ref 0.35–4.50)

## 2020-02-03 MED ORDER — BASAGLAR KWIKPEN 100 UNIT/ML ~~LOC~~ SOPN: 60.0000 [IU] | PEN_INJECTOR | SUBCUTANEOUS | 3 refills | Status: DC

## 2020-02-03 NOTE — Progress Notes (Signed)
Subjective:    Patient ID: Stacey Roberts, female    DOB: [birth year], 55 y.o.   MRN: [id number]  HPI  Patient presents the clinic today for her annual exam. She is also due to follow-up chronic conditions.  History of Breast Cancer: Status post lumpectomy, radiation, reduction and reconstruction.  She is having complications in her left breast from her radiation and considering revision by a plastic surgeon.  She is taking Tamoxifen as prescribed. She continues to follow with oncology.  DM2: Her [name] was 9.8%, 06/2019. She is taking [place], Metformin, Ozempic and Basaglar as prescribed. She takes Pregabalin and Gabapentin for neuropathic pain. Her sugars average about 250's. She follows with podiatry. Her last eye exam was 07/2019. She follows with endocrinology.  GERD: She denies breakthrough on Pantoprazole. There is no upper GI on file.  HLD with CAD: Her last LDL was 34, triglycerides 172, 02/2019. She denies myalgias on Rosuvastatin. She does not consume a low-fat diet. She follows with cardiology.  HTN: Her BP today is 132/84. She is taking Losartan and Metoprolol as prescribed. ECG from 10/2019 reviewed.  Hypothyroidism: Postsurgical secondary to thyroid cancer. She denies any issues on her current dose of Levothyroxine. She follows with endocrinology.  Anxiety and Depression: Managed without meds. She is not currently seeing a therapist. She denies depression, SI/HI.  Flu: 10/2019 Tetanus: 04/2005 Pneumovax: 04/2005 COVID: [place] Pap smear: 09/2017 Mammogram: 08/2019 Colon screening: 03/2015 Vision screening: annually Dentist: as needed  Diet: She does eat meat. She consumes some fruits and veggies. She does eat fried foods. She drinks mostly water, Vitamin water, Dt. Soda. Exercise: None  Review of Systems      Past Medical History:  Diagnosis Date  . Allergy    seasonal  . BRCA negative 2017  . Breast cancer ([place]) 2014  . Breast cancer ([place]) 2017  . Bronchitis     hx of  . Coronary artery disease 06/2019   80% mid RCA stenosis - medical management  . Diabetes mellitus, type II ([place])   . Endometrial hyperplasia 2014   Mirena placed; Dr. Carren Rang  . GERD (gastroesophageal reflux disease)   . Headache   . History of radiation therapy 12/01/15-01/11/16   Left breast DIBH / 50.4 Gy in 28 fractions and Left breast boost / 12 Gy in 6 fractions  . Hyperlipidemia   . Hypertension   . Hypothyroidism   . Joint pain   . Obesity   . PCOS (polycystic ovarian syndrome)   . Personal history of radiation therapy 2017  . Pneumonia    as a child  . Sleep apnea 2008   severe OSA-does not use a cpap  . Snoring   . 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 Buddy Duty    Current Outpatient Medications  Medication Sig Dispense Refill  . aspirin EC 81 MG tablet Take 1 tablet (81 mg total) by mouth daily. 90 tablet 3  . Blood Glucose Monitoring Suppl (FREESTYLE LITE) DEVI 1 each by Does not apply route 2 (two) times daily. [name] 1 each 0  . calcium carbonate (OSCAL) 1500 (600 Ca) MG TABS tablet Take 600 mg of elemental calcium by mouth daily with breakfast.     . FARXIGA 10 MG TABS tablet TAKE 10 MG BY MOUTH DAILY. 90 tablet 2  . gabapentin (NEURONTIN) 300 MG capsule Take 2 capsules (600 mg total) by mouth at  bedtime. 180 capsule 4  . glucose blood (FREESTYLE LITE) test strip 1 each by Other route 2 (two) times daily. [name] 200 each 0  . Insulin Glargine (BASAGLAR KWIKPEN) 100 UNIT/ML Inject 0.5 mLs (50 Units total) into the skin every morning. 25 pen 3  . Lancets (FREESTYLE) lancets 1 each by Other route 2 (two) times daily. [name] 200 each 0  . levonorgestrel (MIRENA) 20 MCG/24HR IUD 1 each by Intrauterine route once.    Marland Kitchen levothyroxine (SYNTHROID) 137 MCG tablet Take 1 tablet (137 mcg total) by mouth daily before breakfast. 90 tablet 3  . losartan (COZAAR) 50 MG  tablet Take 1 tablet (50 mg total) by mouth daily. 90 tablet 0  . metFORMIN (GLUCOPHAGE-XR) 500 MG 24 hr tablet Take 4 tablets (2,000 mg total) by mouth daily with breakfast. 360 tablet 3  . metoprolol tartrate (LOPRESSOR) 50 MG tablet Take 1 tablet (50 mg total) by mouth 2 (two) times daily. 180 tablet 1  . Multiple Vitamins-Minerals (MULTIVITAMIN WITH MINERALS) tablet Take 1 tablet by mouth daily. Woman's Gummies    . nitroGLYCERIN (NITROSTAT) 0.4 MG SL tablet Place 1 tablet (0.4 mg total) under the tongue every 5 (five) minutes as needed for chest pain. 25 tablet prn  . oxyCODONE (OXY IR/ROXICODONE) 5 MG immediate release tablet Take 5 mg by mouth as needed.    . pantoprazole (PROTONIX) 40 MG tablet TAKE 1 TABLET BY MOUTH DAILY. 90 tablet 0  . pregabalin (LYRICA) 75 MG capsule Take 1 capsule (75 mg total) by mouth 2 (two) times daily. 180 capsule 3  . rosuvastatin (CRESTOR) 20 MG tablet Take 1 tablet (20 mg total) by mouth daily. 90 tablet 3  . Semaglutide,0.25 or 0.5MG/DOS, (OZEMPIC, 0.25 OR 0.5 MG/DOSE,) 2 MG/1.5ML SOPN Inject 0.5 mg into the skin once a week. 4.5 mL 3  . tamoxifen (NOLVADEX) 20 MG tablet Take 1 tablet (20 mg total) by mouth daily. 90 tablet 3   No current facility-administered medications for this visit.    Allergies  Allergen Reactions  . Penicillins Shortness Of Breath, Rash and Other (See Comments)    Has patient had a PCN reaction causing immediate rash, facial/tongue/throat swelling, SOB or lightheadedness with hypotension: Yes Has patient had a PCN reaction causing severe rash involving mucus membranes or skin necrosis: Yes Has patient had a PCN reaction that required hospitalization No Has patient had a PCN reaction occurring within the last 10 years: Yes If all of the above answers are "NO", then may proceed with Cephalosporin use.   . Clindamycin/Lincomycin Other (See Comments)    Severe stomach cramps  . Ace Inhibitors Other (See Comments) and Cough     REACTION: cough; denies airway involvement     Family History  Problem Relation Age of Onset  . Dementia Father        deceased age 5 secondary to dementia  . Bipolar disorder Father   . Emphysema Father   . Heart disease Father   . COPD Father        smoker  . Hypertension Father   . Depression Father   . Alcoholism Father   . CAD Mother 54       Died age 35 of CAD  . Heart disease Mother        CABG x 2  . Graves' disease Mother   . Hypertension Mother   . Thyroid disease Mother   . Heart attack Mother 31  . CAD Maternal Grandfather 60  .  Heart disease Maternal Grandfather   . Heart attack Maternal Grandfather 64  . Other Sister 5       hysterectomy for fibroids  . Prostate cancer Brother        low grade; w/ surveillance  . Thyroid nodules Brother 56  . Heart disease Brother 75       CABG x 4  . Heart disease Sister        CABG x 2  . Heart attack Sister 84  . Fibroids Other        niece dx approx 77  . Hypertension Other   . Kidney cancer Cousin        maternal 1st cousin dx 53-47; former smoker  . Lung cancer Other        maternal great aunt (MGM's sister); not a smoker  . Cancer Other        nephew dx neuroblastoma at 17.78 years old  . Colon cancer Neg Hx   . Colon polyps Neg Hx     Social History   Socioeconomic History  . Marital status: Single    Spouse name: Not on file  . Number of children: Not on file  . Years of education: Not on file  . Highest education level: Not on file  Occupational History    Employer: [employer]    Comment: Outpatient scheduling in radiology  Tobacco Use  . Smoking status: Former Smoker    Packs/day: 0.50    Types: Cigarettes    Quit date: 09/29/2007    Years since quitting: 12.3  . Smokeless tobacco: Never Used  . Tobacco comment: Smoked on and off for 20 years. Quit about 8 years ago (as of 08/2015)  Vaping Use  . Vaping Use: Never used  Substance and Sexual Activity  . Alcohol use: Not Currently     Alcohol/week: 0.0 standard drinks  . Drug use: No  . Sexual activity: Not Currently    Birth control/protection: I.U.D.  Other Topics Concern  . Not on file  Social History Narrative   The patient is divorced, moved to [place] in   2006 from [place].  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    No alcohol.  Tobacco use:  She quit 5 years ago.  She smoked on   and off for approximately 20 years.  No history of recreational drug   Use.Drinks two cups of coffee per work day.    Social Determinants of Health   Financial Resource Strain: Not on file  Food Insecurity: Not on file  Transportation Needs: Not on file  Physical Activity: Not on file  Stress: Not on file  Social Connections: Not on file  Intimate Partner Violence: Not on file     Constitutional: Denies fever, malaise, fatigue, headache or abrupt weight changes.  HEENT: Denies eye pain, eye redness, ear pain, ringing in the ears, wax buildup, runny nose, nasal congestion, bloody nose, or sore throat. Respiratory: Denies difficulty breathing, shortness of breath, cough or sputum production.   Cardiovascular: Denies chest pain, chest tightness, palpitations or swelling in the hands or feet.  Gastrointestinal: Denies abdominal pain, bloating, constipation, diarrhea or blood in the stool.  GU: Denies urgency, frequency, pain with urination, burning sensation, blood in urine, odor or discharge. Musculoskeletal: Denies decrease in range of motion, difficulty with gait, muscle pain or joint pain and swelling.  Skin:  Pt reports disfigurement of left breast from radiation. Denies redness, rashes, lesions or ulcercations.  Neurological: Pt reports neuropathic pain. Denies dizziness, difficulty with memory, difficulty with speech or problems with balance and coordination.  Psych: Pt has a history of anxiety and depression. Denies SI/HI.  No  other specific complaints in a complete review of systems (except as listed in HPI above).  Objective:   Physical Exam  BP 132/84   Pulse 86   Temp (!) 97 F (36.1 C) (Temporal)   Ht '5\' 4"'  (1.626 m)   Wt 231 lb (104.8 kg)   SpO2 98%   BMI 39.65 kg/m   Wt Readings from Last 3 Encounters:  01/02/20 231 lb (104.8 kg)  11/13/19 229 lb 8 oz (104.1 kg)  10/30/19 230 lb (104.3 kg)    General: Appears her stated age, obese, in NAD. Skin: Disfigurement noted of left breast. No ulcerations noted. HEENT: Head: normal shape and size; Eyes: sclera white, no icterus, conjunctiva pink, PERRLA and EOMs intact;  Neck:  Neck supple, trachea midline. No masses, lumps present.  Cardiovascular: Normal rate and rhythm. Distant S1,S2 noted.  No murmur, rubs or gallops noted. No JVD or BLE edema. No carotid bruits noted. Pulmonary/Chest: Normal effort and positive vesicular breath sounds. No respiratory distress. No wheezes, rales or ronchi noted.  Abdomen: Soft and nontender. Normal bowel sounds. No distention or masses noted. Liver, spleen and kidneys non palpable. Musculoskeletal: Strength 5/5 BUE/BLE. No difficulty with gait.  Neurological: Alert and oriented. Cranial nerves II-XII grossly intact. Coordination normal.  Psychiatric: Mood and affect normal. Behavior is normal. Judgment and thought content normal.     BMET    Component Value Date/Time   NA 139 08/28/2019 0852   NA 138 03/21/2017 1018   NA 137 02/18/2013 1554   K 4.5 08/28/2019 0852   K 4.4 02/18/2013 1554   CL 103 08/28/2019 0852   CO2 25 08/28/2019 0852   CO2 25 02/18/2013 1554   GLUCOSE 274 (H) 08/28/2019 0852   GLUCOSE 300 (H) 02/18/2013 1554   BUN 11 08/28/2019 0852   BUN 16 03/21/2017 1018   BUN 14.2 02/18/2013 1554   CREATININE 0.81 08/28/2019 0852   CREATININE 0.67 07/16/2017 1442   CREATININE 0.8 02/18/2013 1554   CALCIUM 9.3 08/28/2019 0852   CALCIUM 9.2 02/18/2013 1554   GFRNONAA >60 08/28/2019 0852    GFRNONAA >60 07/16/2017 1442   GFRAA >60 08/28/2019 0852   GFRAA >60 07/16/2017 1442    Lipid Panel     Component Value Date/Time   CHOL 104 03/21/2019 0751   CHOL 122 03/21/2017 1018   TRIG 172.0 (H) 03/21/2019 0751   HDL 35.50 (L) 03/21/2019 0751   HDL 41 03/21/2017 1018   CHOLHDL 3 03/21/2019 0751   VLDL 34.4 03/21/2019 0751   LDLCALC 34 03/21/2019 0751   LDLCALC 63 03/21/2017 1018    CBC    Component Value Date/Time   WBC 7.8 08/28/2019 0852   RBC 4.90 08/28/2019 0852   HGB 13.6 08/28/2019 0852   HGB 13.0 07/16/2017 1442   HGB 13.5 03/21/2017 1018   HGB 12.5 02/18/2013 1554   HCT 42.3 08/28/2019 0852   HCT 42.0 03/21/2017 1018   HCT 39.6 02/18/2013 1554   PLT 241 08/28/2019 0852   PLT 274 07/16/2017 1442   PLT 306 02/18/2013 1554   MCV 86.3 08/28/2019 0852   MCV 86 03/21/2017 1018   MCV 82.6 02/18/2013 1554   MCH 27.8 08/28/2019 0852  MCHC 32.2 08/28/2019 0852   RDW 14.6 08/28/2019 0852   RDW 14.6 03/21/2017 1018   RDW 16.4 (H) 02/18/2013 1554   LYMPHSABS 1.5 08/28/2019 0852   LYMPHSABS 1.5 03/21/2017 1018   LYMPHSABS 2.0 02/18/2013 1554   MONOABS 0.5 08/28/2019 0852   MONOABS 0.6 02/18/2013 1554   EOSABS 0.1 08/28/2019 0852   EOSABS 0.1 03/21/2017 1018   BASOSABS 0.0 08/28/2019 0852   BASOSABS 0.0 03/21/2017 1018   BASOSABS 0.0 02/18/2013 1554    Hgb A1C Lab Results  Component Value Date   HGBA1C 9.8 (A) 07/21/2019            Assessment & Plan:    Preventative Health Maintenance:  Flu shot UTD She declines tetanus today She declines Pneumovax today Encouraged her to get her third [place] booster Mammogram UTD Pap smear UTD Colon screening UTD Encouraged her to consume a balanced diet and exercise regimen Advised her to see an eye doctor and dentist annually We will check CBC, c-Met, TSH, free T4, lipid, A1c and hep C today  RTC in 6 months, follow-up chronic conditions.  [name], NP This visit occurred during the SARS-CoV-2  public health emergency.  Safety protocols were in place, including screening questions prior to the visit, additional usage of staff PPE, and extensive cleaning of exam room while observing appropriate contact time as indicated for disinfecting solutions.

## 2020-02-03 NOTE — Patient Instructions (Addendum)
I have sent a prescription to your pharmacy, to increase the Ozempic Please continue the same other diabetes medications.  Blood tests are requested for you today.  Please have drawn the next time you are having blood taken.  We'll let you know about the results.  check your blood sugar twice a day.  vary the time of day when you check, between before the 3 meals, and at bedtime.  also check if you have symptoms of your blood sugar being too high or too low.  please keep a record of the readings and bring it to your next appointment here (or you can bring the meter itself).  You can write it on any piece of paper.  please call us sooner if your blood sugar goes below 70, or if you have a lot of readings over 200.  Please come back for a follow-up appointment in 2 months.   

## 2020-02-03 NOTE — Progress Notes (Signed)
a 

## 2020-02-03 NOTE — Assessment & Plan Note (Signed)
Try to avoid foods that trigger reflux Weight loss can improve reflux symptoms CBC and c-Met today Continue Pantoprazole for now

## 2020-02-03 NOTE — Assessment & Plan Note (Signed)
A1c today No urine microalbumin secondary to ARB therapy Encouraged her to consume a low-carb diet and exercise for weight loss She will continue to see podiatry for foot exams Encourage routine eye exams Flu shot UTD, she declines Pneumovax, encouraged her to get her third COVID booster She will continue to follow with endocrinology Continue Metformin, [place], Ozempic and [employer]

## 2020-02-03 NOTE — Assessment & Plan Note (Signed)
TSH and free T4 today We will adjust Levothyroxine if needed based on labs She will continue to follow with endocrinology

## 2020-02-03 NOTE — Assessment & Plan Note (Signed)
Reasonable control on Losartan and Metoprolol Reinforced DASH diet and exercise for weight loss C-Met today

## 2020-02-03 NOTE — Patient Instructions (Signed)

## 2020-02-03 NOTE — Progress Notes (Signed)
 Subjective:    Patient ID: Stacey Roberts, female    DOB: 09/10/1965, 55 y.o.   MRN: [id number]  HPI Pt has stage-1 papillary adenocarcinoma of the thyroid.    2/11: thyroidectomy: T1b N0 M0.  4/11: RAI 103 mCi, with thyrogen.  12/11: neck us: single enlarged right cervical lymph node, nonspecific.   6/12: body scan (thyrogen) neg.   12/12: US: lymph node is stable.  6/13: US: overall node morphology and volume show very little change.   9/14  TG undetectable (ab neg) 3/15: TG undetectable (ab neg) 10/15 TG undetectable (ab neg).  3/17 TG undetectable (ab neg).   4/19 TG undetectable (ab neg).   6/20 US no tumor 2/20 TG undetectable (ab neg).   3/21 TG undetectable (ab neg).   Goal TSH is normal, due to long disease-free interval.  Pt returns for f/u of diabetes mellitus:  DM type: Insulin-requiring type 2 Dx'ed: 2008 Complications: PPN and CAD.  Therapy: insulin since early 2017, Ozempic, and 2 oral meds.   GDM: never.  DKA: never.   Severe hypoglycemia: never.   Pancreatitis: never.  Other: she declines multiple daily injections; edema precudes pioglitizone rx.  She declines bariatric surgery for now.  She declines to add bromocriptine; she did not tolerate Trulicity (nausea). Interval history: She never misses meds.  no cbg record, but states cbg varies from 160-200's.  She is very hesitant to increase insulin.  Past Medical History:  Diagnosis Date  . Allergy    seasonal  . BRCA negative 2017  . Breast cancer (HCC) 2014  . Breast cancer (HCC) 2017  . Bronchitis    hx of  . Coronary artery disease 06/2019   80% mid RCA stenosis - medical management  . Diabetes mellitus, type II (HCC)   . Endometrial hyperplasia 2014   Mirena placed; Dr. Mezer  . GERD (gastroesophageal reflux disease)   . Headache   . History of radiation therapy 12/01/15-01/11/16   Left breast DIBH / 50.4 Gy in 28 fractions and Left breast boost / 12 Gy in 6 fractions  . Hyperlipidemia   .  Hypertension   . Hypothyroidism   . Joint pain   . Obesity   . PCOS (polycystic ovarian syndrome)   . Personal history of radiation therapy 2017  . Pneumonia    as a child  . Sleep apnea 2008   severe OSA-does not use a cpap  . Snoring   . 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 Kerr    Past Surgical History:  Procedure Laterality Date  . BREAST BIOPSY  2000   s/p  . BREAST BIOPSY Left 01/22/2013   Procedure: RE-EXCISION LEFT BREAST DCIS;  Surgeon: Douglas A Blackman, MD;  Location: [facility];  Service: General;  Laterality: Left;  . BREAST LUMPECTOMY Left 01/06/2013   Procedure: LUMPECTOMY;  Surgeon: Douglas A Blackman, MD;  Location: [facility];  Service: General;  Laterality: Left;  . BREAST LUMPECTOMY Left 2017  . BREAST LUMPECTOMY WITH NEEDLE LOCALIZATION AND AXILLARY SENTINEL LYMPH NODE BX Left 09/30/2015   Procedure: Left BREAST LUMPECTOMY WITH NEEDLE LOCALIZATION AND AXILLARY SENTINEL LYMPH NODE BX;  Surgeon: Douglas Blackman, MD;  Location: [facility];  Service: General;  Laterality: Left;  . BREAST RECONSTRUCTION Bilateral 10/12/2015   Procedure: BREAST oncoplastic RECONSTRUCTION;  Surgeon: Brinda Thimmappa, MD;  Location: [facility];  Service: Plastics;    Laterality: Bilateral;  . BREAST REDUCTION SURGERY Bilateral 10/12/2015   Procedure: MAMMARY REDUCTION  (BREAST);  Surgeon: Irene Limbo, MD;  Location: [facility];  Service: Plastics;  Laterality: Bilateral;  . DILATION AND CURETTAGE OF UTERUS  2004   s/p  . EXCISION OF BREAST LESION Left 10/12/2015   Procedure: Re-EXCISION OF BREAST;  Surgeon: Coralie Keens, MD;  Location: [facility];  Service: General;  Laterality: Left;  . LEFT HEART CATH AND CORONARY ANGIOGRAPHY N/A 06/27/2019   Procedure: LEFT HEART CATH AND CORONARY ANGIOGRAPHY;  Surgeon: Nelva Bush,  MD;  Location: [facility];  Service: Cardiovascular;  Laterality: N/A;  . REDUCTION MAMMAPLASTY Bilateral 09/2015  . THYROIDECTOMY  02/2009   Dr Harlow Asa  . TONSILLECTOMY  1982    Social History   Socioeconomic History  . Marital status: Single    Spouse name: Not on file  . Number of children: Not on file  . Years of education: Not on file  . Highest education level: Not on file  Occupational History    Employer: [employer]    Comment: Outpatient scheduling in radiology  Tobacco Use  . Smoking status: Former Smoker    Packs/day: 0.50    Types: Cigarettes    Quit date: 09/29/2007    Years since quitting: 12.3  . Smokeless tobacco: Never Used  . Tobacco comment: Smoked on and off for 20 years. Quit about 8 years ago (as of 08/2015)  Vaping Use  . Vaping Use: Never used  Substance and Sexual Activity  . Alcohol use: Not Currently    Alcohol/week: 0.0 standard drinks  . Drug use: No  . Sexual activity: Not Currently    Birth control/protection: I.U.D.  Other Topics Concern  . Not on file  Social History Narrative   The patient is divorced, moved to [place] in   2006 from [place].  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    No alcohol.  Tobacco use:  She quit 5 years ago.  She smoked on   and off for approximately 20 years.  No history of recreational drug   Use.Drinks two cups of coffee per work day.    Social Determinants of Health   Financial Resource Strain: Not on file  Food Insecurity: Not on file  Transportation Needs: Not on file  Physical Activity: Not on file  Stress: Not on file  Social Connections: Not on file  Intimate Partner Violence: Not on file    Current Outpatient Medications on File Prior to Visit  Medication Sig Dispense Refill  . aspirin EC 81 MG tablet Take 1 tablet (81 mg total) by mouth daily. 90 tablet 3  . Blood Glucose Monitoring Suppl  (FREESTYLE LITE) DEVI 1 each by Does not apply route 2 (two) times daily. [name] 1 each 0  . calcium carbonate (OSCAL) 1500 (600 Ca) MG TABS tablet Take 600 mg of elemental calcium by mouth daily with breakfast.     . FARXIGA 10 MG TABS tablet TAKE 10 MG BY MOUTH DAILY. 90 tablet 2  . gabapentin (NEURONTIN) 300 MG capsule Take 2 capsules (600 mg total) by mouth at bedtime. 180 capsule 4  . glucose blood (FREESTYLE LITE) test strip 1 each by Other route 2 (two) times daily. [name] 200 each 0  . Lancets (FREESTYLE) lancets 1 each by Other route 2 (two) times daily.  [name] 200 each 0  . levonorgestrel (MIRENA) 20 MCG/24HR IUD 1 each by Intrauterine route once.    . levothyroxine (SYNTHROID) 137 MCG tablet Take 1 tablet (137 mcg total) by mouth daily before breakfast. 90 tablet 3  . losartan (COZAAR) 50 MG tablet Take 1 tablet (50 mg total) by mouth daily. 90 tablet 0  . metFORMIN (GLUCOPHAGE-XR) 500 MG 24 hr tablet Take 4 tablets (2,000 mg total) by mouth daily with breakfast. 360 tablet 3  . metoprolol tartrate (LOPRESSOR) 50 MG tablet Take 1 tablet (50 mg total) by mouth 2 (two) times daily. 180 tablet 1  . Multiple Vitamins-Minerals (MULTIVITAMIN WITH MINERALS) tablet Take 1 tablet by mouth daily. Woman's Gummies    . nitroGLYCERIN (NITROSTAT) 0.4 MG SL tablet Place 1 tablet (0.4 mg total) under the tongue every 5 (five) minutes as needed for chest pain. 25 tablet prn  . pantoprazole (PROTONIX) 40 MG tablet TAKE 1 TABLET BY MOUTH DAILY. 90 tablet 0  . pregabalin (LYRICA) 75 MG capsule Take 1 capsule (75 mg total) by mouth 2 (two) times daily. 180 capsule 3  . rosuvastatin (CRESTOR) 20 MG tablet Take 1 tablet (20 mg total) by mouth daily. 90 tablet 3  . Semaglutide,0.25 or 0.5MG/DOS, (OZEMPIC, 0.25 OR 0.5 MG/DOSE,) 2 MG/1.5ML SOPN Inject 0.5 mg into the skin once a week. 4.5 mL 3  . tamoxifen (NOLVADEX) 20 MG tablet Take 1 tablet (20 mg total) by mouth daily. 90 tablet 3   No current  facility-administered medications on file prior to visit.    Allergies  Allergen Reactions  . Penicillins Shortness Of Breath, Rash and Other (See Comments)    Has patient had a PCN reaction causing immediate rash, facial/tongue/throat swelling, SOB or lightheadedness with hypotension: Yes Has patient had a PCN reaction causing severe rash involving mucus membranes or skin necrosis: Yes Has patient had a PCN reaction that required hospitalization No Has patient had a PCN reaction occurring within the last 10 years: Yes If all of the above answers are "NO", then may proceed with Cephalosporin use.   . Clindamycin/Lincomycin Other (See Comments)    Severe stomach cramps  . Ace Inhibitors Other (See Comments) and Cough    REACTION: cough; denies airway involvement     Family History  Problem Relation Age of Onset  . Dementia Father        deceased age 69 secondary to dementia  . Bipolar disorder Father   . Emphysema Father   . Heart disease Father   . COPD Father        smoker  . Hypertension Father   . Depression Father   . Alcoholism Father   . CAD Mother 47       Died age 67 of CAD  . Heart disease Mother        CABG x 2  . Graves' disease Mother   . Hypertension Mother   . Thyroid disease Mother   . Heart attack Mother 43  . CAD Maternal Grandfather 60  . Heart disease Maternal Grandfather   . Heart attack Maternal Grandfather 64  . Other Sister 42       hysterectomy for fibroids  . Prostate cancer Brother        low grade; w/ surveillance  . Thyroid nodules Brother 59  . Heart disease Brother 63       CABG x 4  . Heart disease Sister        CABG x 2  .   Heart attack Sister 62  . Fibroids Other        niece dx approx 36  . Hypertension Other   . Kidney cancer Cousin        maternal 1st cousin dx 46-47; former smoker  . Lung cancer Other        maternal great aunt (MGM's sister); not a smoker  . Cancer Other        nephew dx neuroblastoma at 2.5 years old   . Colon cancer Neg Hx   . Colon polyps Neg Hx     BP (!) 142/88   Pulse 86   Ht 5' 4" (1.626 m)   Wt 232 lb (105.2 kg)   SpO2 99%   BMI 39.82 kg/m    Review of Systems Mild nausea persists.  No weight change.      Objective:   Physical Exam VITAL SIGNS:  See vs page.   GENERAL: no distress.   Pulses: dorsalis pedis intact bilat.   MSK: no deformity of the feet CV: trace bilat leg edema. Skin:  no ulcer on the feet.  normal color and temp on the feet.   Neuro: sensation is intact to touch on the feet.     Lab Results  Component Value Date   HGBA1C 10.1 (A) 02/03/2020       Assessment & Plan:  Insulin-requiring type 2 DM: uncontrolled.  I advised her to increase insulin, but she declines to increase to more than 60 units qam.     Patient Instructions  I have sent a prescription to your pharmacy, to increase the Ozempic.   Please continue the same other diabetes medications.  Blood tests are requested for you today.  Please have drawn the next time you are having blood taken.  We'll let you know about the results.  check your blood sugar twice a day.  vary the time of day when you check, between before the 3 meals, and at bedtime.  also check if you have symptoms of your blood sugar being too high or too low.  please keep a record of the readings and bring it to your next appointment here (or you can bring the meter itself).  You can write it on any piece of paper.  please call us sooner if your blood sugar goes below 70, or if you have a lot of readings over 200.  Please come back for a follow-up appointment in 2 months.     

## 2020-02-03 NOTE — Assessment & Plan Note (Signed)
In remission but having trouble with left breast status post radiation, considering plastic surgery Continue Tamoxifen Continue to follow with oncology

## 2020-02-03 NOTE — Assessment & Plan Note (Signed)
No recent angina Managed on Rosuvastatin, Metoprolol and Losartan She will continue to follow with cardiology

## 2020-02-03 NOTE — Assessment & Plan Note (Signed)
C-Met and lipid profile today Encouraged her to consume a low-fat diet Continue Rosuvastatin

## 2020-02-04 LAB — THYROGLOBULIN ANTIBODY: Thyroglobulin Ab: <1 IU/mL (ref <=1)

## 2020-02-04 LAB — THYROGLOBULIN LEVEL: Thyroglobulin: <0.1 ng/mL — ABNORMAL LOW

## 2020-02-04 LAB — HEPATITIS C ANTIBODY
Hepatitis C Ab: NONREACTIVE
SIGNAL TO CUT-OFF: 0.01 (ref <1.00)

## 2020-02-11 ENCOUNTER — Telehealth: Payer: Self-pay

## 2020-02-11 ENCOUNTER — Encounter: Payer: Self-pay | Admitting: Internal Medicine

## 2020-02-11 ENCOUNTER — Encounter: Payer: Self-pay | Admitting: Plastic Surgery

## 2020-02-11 NOTE — Telephone Encounter (Signed)
Patient has already sent a MyChart message.  She is calling today to let us know that she is experiencing lots of pain.  Patient said her left breast feels like someone is stabbing her.  Please call.  Patient's preferred pharmacy is the [facility] in [place] ([address]).

## 2020-02-11 NOTE — Telephone Encounter (Signed)
Matt returned patients call. Advised her our office policy does not prescribe pain medication over the phone, nor for patients who have not had surgery by one of our physicians. Patient stated she will call one of her doctors. Conversation was directly ended.

## 2020-02-12 ENCOUNTER — Encounter: Payer: Self-pay | Admitting: *Deleted

## 2020-02-12 ENCOUNTER — Encounter: Payer: Self-pay | Admitting: Oncology

## 2020-02-12 ENCOUNTER — Other Ambulatory Visit: Payer: Self-pay | Admitting: Oncology

## 2020-02-12 MED ORDER — TRAMADOL HCL 50 MG PO TABS: 50.0000 mg | ORAL_TABLET | Freq: Four times a day (QID) | ORAL | 0 refills | Status: DC | PRN

## 2020-02-12 NOTE — Telephone Encounter (Signed)
Called patient yesterday evening to discuss pain control, advised patient that we do not prescribe pain medications for patients we do not do surgery on.  We also do not prescribe medications without evaluation.  Patient stated she would call her other doctor offices.

## 2020-02-17 ENCOUNTER — Other Ambulatory Visit: Payer: Self-pay | Admitting: Oncology

## 2020-02-17 ENCOUNTER — Other Ambulatory Visit: Payer: Self-pay | Admitting: Internal Medicine

## 2020-02-19 ENCOUNTER — Other Ambulatory Visit: Payer: Self-pay | Admitting: Internal Medicine

## 2020-02-19 ENCOUNTER — Encounter: Payer: Self-pay | Admitting: Internal Medicine

## 2020-02-19 ENCOUNTER — Inpatient Hospital Stay: Admit: 2020-02-19 | Payer: No Typology Code available for payment source | Admitting: Surgery

## 2020-02-19 SURGERY — MASTECTOMY PARTIAL
Anesthesia: General | Site: Breast | Laterality: Left

## 2020-02-19 MED ORDER — LOSARTAN POTASSIUM 50 MG PO TABS: 50.0000 mg | ORAL_TABLET | Freq: Every day | ORAL | 2 refills | Status: DC

## 2020-02-19 NOTE — Telephone Encounter (Signed)
Thi is a duplicate request

## 2020-02-20 ENCOUNTER — Telehealth (INDEPENDENT_AMBULATORY_CARE_PROVIDER_SITE_OTHER): Payer: No Typology Code available for payment source | Admitting: Plastic Surgery

## 2020-02-20 ENCOUNTER — Encounter: Payer: Self-pay | Admitting: Plastic Surgery

## 2020-02-20 ENCOUNTER — Other Ambulatory Visit: Payer: Self-pay

## 2020-02-20 DIAGNOSIS — Z923 Personal history of irradiation: Secondary | ICD-10-CM | POA: Diagnosis not present

## 2020-02-20 DIAGNOSIS — C50112 Malignant neoplasm of central portion of left female breast: Secondary | ICD-10-CM

## 2020-02-20 DIAGNOSIS — E1142 Type 2 diabetes mellitus with diabetic polyneuropathy: Secondary | ICD-10-CM | POA: Diagnosis not present

## 2020-02-20 DIAGNOSIS — N6489 Other specified disorders of breast: Secondary | ICD-10-CM | POA: Diagnosis not present

## 2020-02-20 DIAGNOSIS — Z170 Estrogen receptor positive status [ER+]: Secondary | ICD-10-CM

## 2020-02-20 NOTE — Progress Notes (Addendum)
The patient is a 55 year old female joining me on the phone for a follow-up visit.  She underwent treatment for left-sided breast cancer in 2014 and then again in 2017.  She had a partial mastectomy followed by radiation.  She underwent a tissue rearrangement by Dr. Iran Planas in 2017.  Since then she has noticed a tightness in the contracture in the lateral aspect of the left breast.  The pain has gotten worse and does not seem to be improving with conservative measures.  She is 5 feet 4 inches tall and weighs 231 pounds.  She is not been able to decrease her weight in the last few months.  She did [facility] the past but stopped going due to work and life issues.  Her hemoglobin A1c has increased to 10.  We talked about the option for a latissimus muscle flap today.  That is still an option for her but we would want to make sure that she was the healthiest she could be.  She is very willing to see the [facility] again.  I think that would be a huge help.  We would like her hemoglobin A1c to be less than 8 prior to surgery of that kind.  She is in agreement.  We will do the referral to the [facility] center and plan to see her back in approximately 6 months.  I connected with  Roosvelt Maser on 02/20/20 by a telemedicine application and verified that I am speaking with the correct person using two identifiers.  The patient was at home and I was at the office.   I discussed the limitations of evaluation and management by telemedicine. The patient expressed understanding and agreed to proceed.

## 2020-03-10 ENCOUNTER — Encounter: Payer: Self-pay | Admitting: Internal Medicine

## 2020-03-10 DIAGNOSIS — G8929 Other chronic pain: Secondary | ICD-10-CM

## 2020-03-10 NOTE — Progress Notes (Signed)
PCP: Jearld Fenton, NP   Chief Complaint  Patient presents with  . Gynecologic Exam    No concerns    HPI:      [name] is a 55 y.o. G0P0000 who LMP was No LMP recorded. (Menstrual status: IUD)., presents today for her annual examination.  Her menses are absent due to tamoxifen/Mirena IUD and possibly age.  Dysmenorrhea none. Mirena placed 08/2012 due to endometrial hyperplasia. Previous GYN suggested it stay in for 7 yrs (due for removal this yr). She has occas vasomotor sx improved with prn gabapentin. Pt to be on tamoxifen for 5 yrs total.   Sex activity: not sexually active. She does not have vaginal dryness.  Last Pap: 10/18/17  Results were: no abnormalities /neg HPV DNA.  Hx of STDs: ext HPV lesion excised from labia with GYN a few yrs ago  Last mammogram: 09/10/19, followed by oncology.  Results were: cat 4 LT breast; s/p neg bx for DCIS; had breast MRI with neg bx for cancer; repeat imaging recommended 3/22. Hx of breast cancer 2014 and 2017, on tamoxifen since 2017. Had neg cancer genetic testing 2017 per pt report. There is no FH of breast cancer. There is no FH of ovarian cancer. The patient does do self-breast exams.  Colonoscopy: age 5 without abnormalities; Repeat due after 10 years.   Tobacco use: The patient denies current or previous tobacco use. Alcohol use: none  No drug use Exercise: min active  She does not get adequate calcium and Vitamin D in her diet. Hx of Vit D deficiency in past. Labs with PCP.   Past Medical History:  Diagnosis Date  . Allergy    seasonal  . BRCA negative 2017  . Breast cancer ([place]) 2014  . Breast cancer ([place]) 2017  . Bronchitis    hx of  . Coronary artery disease 06/2019   80% mid RCA stenosis - medical management  . Diabetes mellitus, type II ([place])   . Endometrial hyperplasia 2014   Mirena placed; Dr. Carren Rang  . GERD (gastroesophageal reflux disease)   . Headache   . History of radiation therapy  12/01/15-01/11/16   Left breast DIBH / 50.4 Gy in 28 fractions and Left breast boost / 12 Gy in 6 fractions  . Hyperlipidemia   . Hypertension   . Hypothyroidism   . Joint pain   . Obesity   . PCOS (polycystic ovarian syndrome)   . Personal history of radiation therapy 2017  . Pneumonia    as a child  . Sleep apnea 2008   severe OSA-does not use a cpap  . Snoring   . 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 Buddy Duty    Past Surgical History:  Procedure Laterality Date  . BREAST BIOPSY  2000   s/p  . BREAST BIOPSY Left 01/22/2013   Procedure: RE-EXCISION LEFT BREAST DCIS;  Surgeon: Harl Bowie, MD;  Location: [place];  Service: General;  Laterality: Left;  . BREAST LUMPECTOMY Left 01/06/2013   Procedure: LUMPECTOMY;  Surgeon: Harl Bowie, MD;  Location: [place] [facility];  Service: General;  Laterality: Left;  . BREAST LUMPECTOMY Left 2017  . BREAST LUMPECTOMY WITH NEEDLE LOCALIZATION AND AXILLARY SENTINEL LYMPH NODE BX Left 09/30/2015   Procedure: Left BREAST LUMPECTOMY WITH NEEDLE LOCALIZATION AND AXILLARY SENTINEL LYMPH NODE BX;  Surgeon: Coralie Keens, MD;  Location: [facility];  Service: General;  Laterality: Left;  . BREAST RECONSTRUCTION Bilateral 10/12/2015   Procedure: BREAST oncoplastic RECONSTRUCTION;  Surgeon: Irene Limbo, MD;  Location: [facility];  Service: Plastics;  Laterality: Bilateral;  . BREAST REDUCTION SURGERY Bilateral 10/12/2015   Procedure: MAMMARY REDUCTION  (BREAST);  Surgeon: Irene Limbo, MD;  Location: [facility];  Service: Plastics;  Laterality: Bilateral;  . DILATION AND CURETTAGE OF UTERUS  2004   s/p  . EXCISION OF BREAST LESION Left 10/12/2015   Procedure: Re-EXCISION OF BREAST;  Surgeon: Coralie Keens, MD;  Location: [facility];  Service: General;  Laterality: Left;  . LEFT  HEART CATH AND CORONARY ANGIOGRAPHY N/A 06/27/2019   Procedure: LEFT HEART CATH AND CORONARY ANGIOGRAPHY;  Surgeon: Nelva Bush, MD;  Location: [facility];  Service: Cardiovascular;  Laterality: N/A;  . REDUCTION MAMMAPLASTY Bilateral 09/2015  . THYROIDECTOMY  02/2009   Dr Harlow Asa  . TONSILLECTOMY  1982    Family History  Problem Relation Age of Onset  . Dementia Father        deceased age 65 secondary to dementia  . Bipolar disorder Father   . Emphysema Father   . Heart disease Father   . COPD Father        smoker  . Hypertension Father   . Depression Father   . Alcoholism Father   . CAD Mother 81       Died age 2 of CAD  . Heart disease Mother        CABG x 2  . Graves' disease Mother   . Hypertension Mother   . Thyroid disease Mother   . Heart attack Mother 32  . CAD Maternal Grandfather 60  . Heart disease Maternal Grandfather   . Heart attack Maternal Grandfather 64  . Other Sister 81       hysterectomy for fibroids  . Prostate cancer Brother        low grade; w/ surveillance  . Thyroid nodules Brother 7  . Heart disease Brother 46       CABG x 4  . Heart disease Sister        CABG x 2  . Heart attack Sister 35  . Fibroids Other        niece dx approx 68  . Hypertension Other   . Kidney cancer Cousin        maternal 1st cousin dx 62-47; former smoker  . Lung cancer Other        maternal great aunt (MGM's sister); not a smoker  . Cancer Other        nephew dx neuroblastoma at 47.67 years old  . Colon cancer Neg Hx   . Colon polyps Neg Hx     Social History   Socioeconomic History  . Marital status: Single    Spouse name: Not on file  . Number of children: Not on file  . Years of education: Not on file  . Highest education level: Not on file  Occupational History    Employer: [employer]    Comment: Outpatient scheduling in radiology  Tobacco Use  . Smoking status: Former Smoker    Packs/day: 0.50    Types: Cigarettes    Quit date:  09/29/2007    Years since quitting: 12.4  . Smokeless tobacco: Never Used  . Tobacco comment: Smoked on and off for 20 years. Quit about 8 years ago (as of 08/2015)  Vaping Use  .  Vaping Use: Never used  Substance and Sexual Activity  . Alcohol use: Not Currently    Alcohol/week: 0.0 standard drinks  . Drug use: No  . Sexual activity: Not Currently    Birth control/protection: I.U.D.  Other Topics Concern  . Not on file  Social History Narrative   The patient is divorced, moved to [place] in   2006 from [place].  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    No alcohol.  Tobacco use:  She quit 5 years ago.  She smoked on   and off for approximately 20 years.  No history of recreational drug   Use.Drinks two cups of coffee per work day.    Social Determinants of Health   Financial Resource Strain: Not on file  Food Insecurity: Not on file  Transportation Needs: Not on file  Physical Activity: Not on file  Stress: Not on file  Social Connections: Not on file  Intimate Partner Violence: Not on file     Current Outpatient Medications:  .  aspirin EC 81 MG tablet, Take 1 tablet (81 mg total) by mouth daily., Disp: 90 tablet, Rfl: 3 .  Blood Glucose Monitoring Suppl (FREESTYLE LITE) DEVI, 1 each by Does not apply route 2 (two) times daily. [name], Disp: 1 each, Rfl: 0 .  calcium carbonate (OSCAL) 1500 (600 Ca) MG TABS tablet, Take 600 mg of elemental calcium by mouth daily with breakfast. , Disp: , Rfl:  .  FARXIGA 10 MG TABS tablet, TAKE 10 MG BY MOUTH DAILY., Disp: 90 tablet, Rfl: 2 .  gabapentin (NEURONTIN) 300 MG capsule, Take 2 capsules (600 mg total) by mouth at bedtime., Disp: 180 capsule, Rfl: 4 .  glucose blood (FREESTYLE LITE) test strip, 1 each by Other route 2 (two) times daily. [name], Disp: 200 each, Rfl: 0 .  Insulin Glargine (BASAGLAR KWIKPEN) 100 UNIT/ML, Inject 60 Units into  the skin every morning., Disp: 60 mL, Rfl: 3 .  Lancets (FREESTYLE) lancets, 1 each by Other route 2 (two) times daily. [name], Disp: 200 each, Rfl: 0 .  levonorgestrel (MIRENA) 20 MCG/24HR IUD, 1 each by Intrauterine route once., Disp: , Rfl:  .  levothyroxine (SYNTHROID) 137 MCG tablet, Take 1 tablet (137 mcg total) by mouth daily before breakfast., Disp: 90 tablet, Rfl: 3 .  losartan (COZAAR) 50 MG tablet, Take 1 tablet (50 mg total) by mouth daily., Disp: 90 tablet, Rfl: 2 .  metFORMIN (GLUCOPHAGE-XR) 500 MG 24 hr tablet, Take 4 tablets (2,000 mg total) by mouth daily with breakfast., Disp: 360 tablet, Rfl: 3 .  metoprolol tartrate (LOPRESSOR) 50 MG tablet, Take 1 tablet (50 mg total) by mouth 2 (two) times daily., Disp: 180 tablet, Rfl: 1 .  Multiple Vitamins-Minerals (MULTIVITAMIN WITH MINERALS) tablet, Take 1 tablet by mouth daily. Woman's Gummies, Disp: , Rfl:  .  nitroGLYCERIN (NITROSTAT) 0.4 MG SL tablet, Place 1 tablet (0.4 mg total) under the tongue every 5 (five) minutes as needed for chest pain., Disp: 25 tablet, Rfl: prn .  pantoprazole (PROTONIX) 40 MG tablet, TAKE 1 TABLET BY MOUTH DAILY., Disp: 90 tablet, Rfl: 0 .  pregabalin (LYRICA) 75 MG capsule, Take 1 capsule (75 mg total) by mouth 2 (two) times daily., Disp: 180 capsule, Rfl: 3 .  rosuvastatin (CRESTOR) 20 MG tablet, Take 1 tablet (20 mg total) by mouth daily., Disp: 90 tablet, Rfl: 3 .  Semaglutide,0.25 or 0.5MG/DOS, (OZEMPIC, 0.25 OR 0.5 MG/DOSE,) 2 MG/1.5ML SOPN, Inject 0.5 mg into the skin once a week., Disp: 4.5 mL, Rfl: 3 .  tamoxifen (NOLVADEX) 20 MG tablet, Take 1 tablet (20 mg total) by mouth daily., Disp: 90 tablet, Rfl: 3 .  traMADol (ULTRAM) 50 MG tablet, Take 1 tablet (50 mg total) by mouth every 6 (six) hours as needed., Disp: 60 tablet, Rfl: 0     ROS:  Review of Systems  Constitutional: Negative for fatigue, fever and unexpected weight change.  Respiratory: Negative for cough, shortness of breath and  wheezing.   Cardiovascular: Negative for chest pain, palpitations and leg swelling.  Gastrointestinal: Negative for blood in stool, constipation, diarrhea, nausea and vomiting.  Endocrine: Negative for cold intolerance, heat intolerance and polyuria.  Genitourinary: Negative for dyspareunia, dysuria, flank pain, frequency, genital sores, hematuria, menstrual problem, pelvic pain, urgency, vaginal bleeding, vaginal discharge and vaginal pain.  Musculoskeletal: Negative for back pain, joint swelling and myalgias.  Skin: Negative for rash.  Neurological: Negative for dizziness, syncope, light-headedness, numbness and headaches.  Hematological: Negative for adenopathy.  Psychiatric/Behavioral: Negative for agitation, confusion, sleep disturbance and suicidal ideas. The patient is not nervous/anxious.   BREAST: No symptoms   Objective: BP 136/90   Ht _0  (1.626 m)   Wt 231 lb (104.8 kg)   BMI 39.65 kg/m    Physical Exam Constitutional:      Appearance: She is well-developed.  Genitourinary:     Vulva normal.     Right Labia: No rash, tenderness or lesions.    Left Labia: No tenderness, lesions or rash.    No vaginal discharge, erythema or tenderness.      Right Adnexa: not tender and no mass present.    Left Adnexa: not tender and no mass present.    No cervical friability or polyp.     IUD strings visualized.     Uterus is not enlarged or tender.  Breasts:     Right: No mass, nipple discharge, skin change or tenderness.     Left: No mass, nipple discharge, skin change or tenderness.    Neck:     Thyroid: No thyromegaly.  Cardiovascular:     Rate and Rhythm: Normal rate and regular rhythm.     Heart sounds: Normal heart sounds. No murmur heard.   Pulmonary:     Effort: Pulmonary effort is normal.     Breath sounds: Normal breath sounds.  Abdominal:     Palpations: Abdomen is soft.     Tenderness: There is no abdominal tenderness. There is no guarding or rebound.   Musculoskeletal:        General: Normal range of motion.     Cervical back: Normal range of motion.  Lymphadenopathy:     Cervical: No cervical adenopathy.  Neurological:     General: No focal deficit present.     Mental Status: She is alert and oriented to person, place, and time.     Cranial Nerves: No cranial nerve deficit.  Skin:    General: Skin is warm and dry.  Psychiatric:        Mood and Affect: Mood normal.        Behavior: Behavior normal.        Thought Content: Thought content normal.        Judgment: Judgment normal.  Vitals reviewed.    IUD Removal Strings of IUD identified and grasped.  IUD removed without problem with ring forceps.  Pt tolerated this well.  IUD noted to be intact.  Assessment/Plan:  Encounter for annual routine gynecological examination  Endometrial hyperplasia--controlled with Mirena, placed 2014. IUD removed today. Will check GYN u/s in 6 months to assess EM. Pt still on tamoxifen. F/u prn PMB.  IUD removal--pt tolerated well.  Malignant neoplasm of central portion of left breast in female, estrogen receptor positive (HCC)--on tamoxifen, followed by oncology; BRCA neg per pt report.          GYN counsel breast self exam, mammography screening, menopause, adequate intake of calcium and vitamin D, diet and exercise    F/U  Return in about 6 months (around 09/08/2020) for GYN u/s for endometrial thickness--[facility] to call pt.  Kylin Genna B. Evony Rezek, PA-C 03/11/2020 9:36 AM

## 2020-03-11 ENCOUNTER — Encounter: Payer: Self-pay | Admitting: Obstetrics and Gynecology

## 2020-03-11 ENCOUNTER — Ambulatory Visit (INDEPENDENT_AMBULATORY_CARE_PROVIDER_SITE_OTHER): Payer: No Typology Code available for payment source | Admitting: Obstetrics and Gynecology

## 2020-03-11 ENCOUNTER — Other Ambulatory Visit: Payer: Self-pay

## 2020-03-11 VITALS — BP 136/90 | Ht 64.0 in | Wt 231.0 lb

## 2020-03-11 DIAGNOSIS — Z1231 Encounter for screening mammogram for malignant neoplasm of breast: Secondary | ICD-10-CM | POA: Diagnosis not present

## 2020-03-11 DIAGNOSIS — Z30432 Encounter for removal of intrauterine contraceptive device: Secondary | ICD-10-CM | POA: Diagnosis not present

## 2020-03-11 DIAGNOSIS — Z8742 Personal history of other diseases of the female genital tract: Secondary | ICD-10-CM

## 2020-03-11 DIAGNOSIS — Z30431 Encounter for routine checking of intrauterine contraceptive device: Secondary | ICD-10-CM

## 2020-03-11 DIAGNOSIS — Z170 Estrogen receptor positive status [ER+]: Secondary | ICD-10-CM

## 2020-03-11 DIAGNOSIS — C50112 Malignant neoplasm of central portion of left female breast: Secondary | ICD-10-CM

## 2020-03-11 DIAGNOSIS — Z01419 Encounter for gynecological examination (general) (routine) without abnormal findings: Secondary | ICD-10-CM

## 2020-03-11 NOTE — Patient Instructions (Signed)
I value your feedback and you entrusting us with your care. If you get a [facility] patient survey, I would appreciate you taking the time to let us know about your experience today. Thank you! ? ? ?

## 2020-04-05 ENCOUNTER — Other Ambulatory Visit: Payer: Self-pay | Admitting: Internal Medicine

## 2020-04-05 ENCOUNTER — Encounter: Payer: Self-pay | Admitting: Internal Medicine

## 2020-04-05 MED ORDER — PANTOPRAZOLE SODIUM 40 MG PO TBEC: 40.0000 mg | DELAYED_RELEASE_TABLET | Freq: Every day | ORAL | 0 refills | Status: DC

## 2020-04-08 ENCOUNTER — Ambulatory Visit: Payer: No Typology Code available for payment source | Admitting: Endocrinology

## 2020-04-08 ENCOUNTER — Other Ambulatory Visit: Payer: Self-pay | Admitting: Endocrinology

## 2020-04-08 ENCOUNTER — Other Ambulatory Visit: Payer: Self-pay

## 2020-04-08 ENCOUNTER — Encounter: Payer: Self-pay | Admitting: Plastic Surgery

## 2020-04-08 VITALS — BP 130/80 | HR 107 | Ht 64.0 in | Wt 223.4 lb

## 2020-04-08 DIAGNOSIS — E1142 Type 2 diabetes mellitus with diabetic polyneuropathy: Secondary | ICD-10-CM

## 2020-04-08 DIAGNOSIS — Z794 Long term (current) use of insulin: Secondary | ICD-10-CM

## 2020-04-08 LAB — POCT GLYCOSYLATED HEMOGLOBIN (HGB A1C): Hemoglobin A1C: 7.3 % — AB (ref 4.0–5.6)

## 2020-04-08 MED ORDER — OZEMPIC (0.25 OR 0.5 MG/DOSE) 2 MG/1.5ML ~~LOC~~ SOPN: 0.5000 mg | PEN_INJECTOR | SUBCUTANEOUS | 3 refills | Status: DC

## 2020-04-08 MED ORDER — LEVOTHYROXINE SODIUM 137 MCG PO TABS: 137.0000 ug | ORAL_TABLET | Freq: Every day | ORAL | 3 refills | Status: DC

## 2020-04-08 MED ORDER — INSULIN GLARGINE-YFGN 100 UNIT/ML ~~LOC~~ SOPN: 60.0000 [IU] | PEN_INJECTOR | SUBCUTANEOUS | 3 refills | Status: DC

## 2020-04-08 NOTE — Progress Notes (Signed)
Subjective:    Patient ID: Stacey Roberts, female    DOB: [birth year], 55 y.o.   MRN: [id number]  HPI Pt has stage-1 PTC of the thyroid.    2/11: thyroidectomy: T1b N0 M0.  4/11: RAI 103 mCi, with thyrogen.  12/11: neck US: single enlarged right cervical lymph node, nonspecific.   6/12: body scan (thyrogen) neg.   12/12: [place]: lymph node is stable.  6/13: [place]: overall node morphology and volume show very little change.   9/14  TG undetectable (ab neg) 3/15: TG undetectable (ab neg) 10/15 TG undetectable (ab neg).  3/17 TG undetectable (ab neg).   4/19 TG undetectable (ab neg).   6/20 [place] no tumor 2/20 TG undetectable (ab neg).   3/21 TG undetectable (ab neg).   Goal TSH is normal, due to long disease-free interval.  Pt returns for f/u of diabetes mellitus:  DM type: Insulin-requiring type 2 Dx'ed: 2426 Complications: PPN and CAD.  Therapy: insulin since early 2017, Ozempic, and 2 oral meds.   GDM: never.  DKA: never.   Severe hypoglycemia: never.   Pancreatitis: never.  Other: she declines multiple daily injections; edema precudes pioglitizone rx.  She declines bariatric surgery for now.  She declines to add bromocriptine; she did not tolerate Trulicity (nausea). Interval history: She never misses meds.  no cbg record, but states cbg varies from 110-160.  She checks fasting.   Past Medical History:  Diagnosis Date  . Allergy    seasonal  . BRCA negative 2017  . Breast cancer ([place]) 2014  . Breast cancer ([place]) 2017  . Bronchitis    hx of  . Coronary artery disease 06/2019   80% mid RCA stenosis - medical management  . Diabetes mellitus, type II ([place])   . Endometrial hyperplasia 2014   Mirena placed; Dr. Carren Rang  . GERD (gastroesophageal reflux disease)   . Headache   . History of radiation therapy 12/01/15-01/11/16   Left breast DIBH / 50.4 Gy in 28 fractions and Left breast boost / 12 Gy in 6 fractions  . Hyperlipidemia   . Hypertension   . Hypothyroidism   . Joint pain    . Obesity   . PCOS (polycystic ovarian syndrome)   . Personal history of radiation therapy 2017  . Pneumonia    as a child  . Sleep apnea 2008   severe OSA-does not use a cpap  . Snoring   . 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 Buddy Duty    Past Surgical History:  Procedure Laterality Date  . BREAST BIOPSY  2000   s/p  . BREAST BIOPSY Left 01/22/2013   Procedure: RE-EXCISION LEFT BREAST DCIS;  Surgeon: Harl Bowie, MD;  Location: [facility];  Service: General;  Laterality: Left;  . BREAST LUMPECTOMY Left 01/06/2013   Procedure: LUMPECTOMY;  Surgeon: Harl Bowie, MD;  Location: [facility];  Service: General;  Laterality: Left;  . BREAST LUMPECTOMY Left 2017  . BREAST LUMPECTOMY WITH NEEDLE LOCALIZATION AND AXILLARY SENTINEL LYMPH NODE BX Left 09/30/2015   Procedure: Left BREAST LUMPECTOMY WITH NEEDLE LOCALIZATION AND AXILLARY SENTINEL LYMPH NODE BX;  Surgeon: Coralie Keens, MD;  Location: [facility];  Service: General;  Laterality: Left;  . BREAST RECONSTRUCTION Bilateral 10/12/2015   Procedure: BREAST oncoplastic RECONSTRUCTION;  Surgeon: Irene Limbo, MD;  Location: [facility];  Service: Plastics;  Laterality: Bilateral;  .  BREAST REDUCTION SURGERY Bilateral 10/12/2015   Procedure: MAMMARY REDUCTION  (BREAST);  Surgeon: Irene Limbo, MD;  Location: [facility];  Service: Plastics;  Laterality: Bilateral;  . DILATION AND CURETTAGE OF UTERUS  2004   s/p  . EXCISION OF BREAST LESION Left 10/12/2015   Procedure: Re-EXCISION OF BREAST;  Surgeon: Coralie Keens, MD;  Location: [facility];  Service: General;  Laterality: Left;  . LEFT HEART CATH AND CORONARY ANGIOGRAPHY N/A 06/27/2019   Procedure: LEFT HEART CATH AND CORONARY ANGIOGRAPHY;  Surgeon: Nelva Bush, MD;  Location: [facility];  Service:  Cardiovascular;  Laterality: N/A;  . REDUCTION MAMMAPLASTY Bilateral 09/2015  . THYROIDECTOMY  02/2009   Dr Harlow Asa  . TONSILLECTOMY  1982    Social History   Socioeconomic History  . Marital status: Single    Spouse name: Not on file  . Number of children: Not on file  . Years of education: Not on file  . Highest education level: Not on file  Occupational History    Employer: [employer]    Comment: Outpatient scheduling in radiology  Tobacco Use  . Smoking status: Former Smoker    Packs/day: 0.50    Types: Cigarettes    Quit date: 09/29/2007    Years since quitting: 12.5  . Smokeless tobacco: Never Used  . Tobacco comment: Smoked on and off for 20 years. Quit about 8 years ago (as of 08/2015)  Vaping Use  . Vaping Use: Never used  Substance and Sexual Activity  . Alcohol use: Not Currently    Alcohol/week: 0.0 standard drinks  . Drug use: No  . Sexual activity: Not Currently    Birth control/protection: I.U.D.  Other Topics Concern  . Not on file  Social History Narrative   The patient is divorced, moved to [place] in   2006 from [place].  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    No alcohol.  Tobacco use:  She quit 5 years ago.  She smoked on   and off for approximately 20 years.  No history of recreational drug   Use.Drinks two cups of coffee per work day.    Social Determinants of Health   Financial Resource Strain: Not on file  Food Insecurity: Not on file  Transportation Needs: Not on file  Physical Activity: Not on file  Stress: Not on file  Social Connections: Not on file  Intimate Partner Violence: Not on file    Current Outpatient Medications on File Prior to Visit  Medication Sig Dispense Refill  . aspirin EC 81 MG tablet Take 1 tablet (81 mg total) by mouth daily. 90 tablet 3  . Blood Glucose Monitoring Suppl (FREESTYLE LITE) DEVI 1 each by Does not  apply route 2 (two) times daily. [name] 1 each 0  . calcium carbonate (OSCAL) 1500 (600 Ca) MG TABS tablet Take 600 mg of elemental calcium by mouth daily with breakfast.     . FARXIGA 10 MG TABS tablet TAKE 10 MG BY MOUTH DAILY. 90 tablet 2  . gabapentin (NEURONTIN) 300 MG capsule Take 2 capsules (600 mg total) by mouth at bedtime. 180 capsule 4  . glucose blood (FREESTYLE LITE) test strip 1 each by Other route 2 (two) times daily. [name] 200 each 0  . Lancets (FREESTYLE) lancets 1 each by Other route 2 (two) times daily. [name] 200 each 0  .  levonorgestrel (MIRENA) 20 MCG/24HR IUD 1 each by Intrauterine route once.    Marland Kitchen losartan (COZAAR) 50 MG tablet Take 1 tablet (50 mg total) by mouth daily. 90 tablet 2  . metFORMIN (GLUCOPHAGE-XR) 500 MG 24 hr tablet Take 4 tablets (2,000 mg total) by mouth daily with breakfast. 360 tablet 3  . metoprolol tartrate (LOPRESSOR) 50 MG tablet Take 1 tablet (50 mg total) by mouth 2 (two) times daily. 180 tablet 1  . Multiple Vitamins-Minerals (MULTIVITAMIN WITH MINERALS) tablet Take 1 tablet by mouth daily. Woman's Gummies    . nitroGLYCERIN (NITROSTAT) 0.4 MG SL tablet Place 1 tablet (0.4 mg total) under the tongue every 5 (five) minutes as needed for chest pain. 25 tablet prn  . pantoprazole (PROTONIX) 40 MG tablet Take 1 tablet (40 mg total) by mouth daily. 90 tablet 0  . pregabalin (LYRICA) 75 MG capsule Take 1 capsule (75 mg total) by mouth 2 (two) times daily. 180 capsule 3  . rosuvastatin (CRESTOR) 20 MG tablet Take 1 tablet (20 mg total) by mouth daily. 90 tablet 3  . tamoxifen (NOLVADEX) 20 MG tablet Take 1 tablet (20 mg total) by mouth daily. 90 tablet 3  . traMADol (ULTRAM) 50 MG tablet Take 1 tablet (50 mg total) by mouth every 6 (six) hours as needed. 60 tablet 0   No current facility-administered medications on file prior to visit.    Allergies  Allergen Reactions  . Penicillins Shortness Of Breath, Rash and Other (See Comments)    Has patient had  a PCN reaction causing immediate rash, facial/tongue/throat swelling, SOB or lightheadedness with hypotension: Yes Has patient had a PCN reaction causing severe rash involving mucus membranes or skin necrosis: Yes Has patient had a PCN reaction that required hospitalization No Has patient had a PCN reaction occurring within the last 10 years: Yes If all of the above answers are "NO", then may proceed with Cephalosporin use.   . Clindamycin/Lincomycin Other (See Comments)    Severe stomach cramps  . Ace Inhibitors Other (See Comments) and Cough    REACTION: cough; denies airway involvement     Family History  Problem Relation Age of Onset  . Dementia Father        deceased age 27 secondary to dementia  . Bipolar disorder Father   . Emphysema Father   . Heart disease Father   . COPD Father        smoker  . Hypertension Father   . Depression Father   . Alcoholism Father   . CAD Mother 70       Died age 70 of CAD  . Heart disease Mother        CABG x 2  . Graves' disease Mother   . Hypertension Mother   . Thyroid disease Mother   . Heart attack Mother 80  . CAD Maternal Grandfather 60  . Heart disease Maternal Grandfather   . Heart attack Maternal Grandfather 64  . Other Sister 64       hysterectomy for fibroids  . Prostate cancer Brother        low grade; w/ surveillance  . Thyroid nodules Brother 45  . Heart disease Brother 65       CABG x 4  . Heart disease Sister        CABG x 2  . Heart attack Sister 37  . Fibroids Other        niece dx approx 44  . Hypertension Other   .  Kidney cancer Cousin        maternal 1st cousin dx 87-47; former smoker  . Lung cancer Other        maternal great aunt (MGM's sister); not a smoker  . Cancer Other        nephew dx neuroblastoma at 75.96 years old  . Colon cancer Neg Hx   . Colon polyps Neg Hx     BP 130/80 (BP Location: Right Arm, Patient Position: Sitting, Cuff Size: Large)   Pulse (!) 107   Ht _0  (1.626 m)   Wt 223  lb 6.4 oz (101.3 kg)   SpO2 97%   BMI 38.35 kg/m    Review of Systems She has lost weight--intentional.  Nausea is mild    Objective:   Physical Exam VITAL SIGNS:  See vs page GENERAL: no distress Pulses: dorsalis pedis intact bilat.   MSK: no deformity of the feet CV: no leg edema Skin:  no ulcer on the feet.  normal color and temp on the feet. Neuro: sensation is intact to touch on the feet.   A1c=7.3%     Assessment & Plan:  PTC: due for recheck.  Insulin-requiring type 2 DM, with CAD: this is the best control this pt should aim for, given this regimen, which does match insulin to her changing needs throughout the day.    Patient Instructions  Please continue the same other 4 diabetes medications.  Blood tests are requested for you today.  Please have drawn the next time you are having blood taken.  We'll let you know about the results.  check your blood sugar twice a day.  vary the time of day when you check, between before the 3 meals, and at bedtime.  also check if you have symptoms of your blood sugar being too high or too low.  please keep a record of the readings and bring it to your next appointment here (or you can bring the meter itself).  You can write it on any piece of paper.  please call us sooner if your blood sugar goes below 70, or if you have a lot of readings over 200.  Please come back for a follow-up appointment in 4 months.

## 2020-04-08 NOTE — Patient Instructions (Addendum)
Please continue the same other 4 diabetes medications.  Blood tests are requested for you today.  Please have drawn the next time you are having blood taken.  We'll let you know about the results.  check your blood sugar twice a day.  vary the time of day when you check, between before the 3 meals, and at bedtime.  also check if you have symptoms of your blood sugar being too high or too low.  please keep a record of the readings and bring it to your next appointment here (or you can bring the meter itself).  You can write it on any piece of paper.  please call us sooner if your blood sugar goes below 70, or if you have a lot of readings over 200.  Please come back for a follow-up appointment in 4 months.

## 2020-04-12 ENCOUNTER — Other Ambulatory Visit: Payer: Self-pay | Admitting: Endocrinology

## 2020-04-13 ENCOUNTER — Other Ambulatory Visit: Payer: Self-pay | Admitting: Endocrinology

## 2020-04-13 ENCOUNTER — Encounter: Payer: Self-pay | Admitting: Endocrinology

## 2020-04-14 ENCOUNTER — Other Ambulatory Visit: Payer: Self-pay

## 2020-04-14 MED ORDER — DAPAGLIFLOZIN PROPANEDIOL 5 MG PO TABS: 5.0000 mg | ORAL_TABLET | Freq: Every day | ORAL | 3 refills | Status: DC

## 2020-04-15 ENCOUNTER — Other Ambulatory Visit: Payer: Self-pay | Admitting: Endocrinology

## 2020-04-19 ENCOUNTER — Telehealth: Payer: Self-pay

## 2020-04-19 NOTE — Telephone Encounter (Signed)
Called and spoke with the patient regarding the message below.  I informed the patient that I reviewed her previous MyChart message that was sent on (04/08/20).  I asked the patient if she saw the response from [facility] regarding her message.  Patient stated that no she did not see the response back message, she can only see her message.  I read the patient Matthew-PA-C MyChart response:  I reviewed previous notes and at her previous visit with you via telemedicine you guys discussed latissimus flap if her A1c was less than 8.  Would you like her to come in for a reevaluation or additional virtual visit to discuss.    Patient verbalized understanding and stated that yes she would like to come in to see Dr. Marla Roe, so she can reevaluate.  Informed the patient that I will give her a call back regarding scheduling an appointment.//AB/CMA

## 2020-04-19 NOTE — Telephone Encounter (Signed)
Patient called to let us know that Dr. Marla Roe had recommended her A1c level be below 8 before having surgery.  Patient states that her A1c is now 7.3 and she would like to know her next step.  Please call.

## 2020-04-19 NOTE — Telephone Encounter (Signed)
Called and [facility] asking the patient to give the office a call back to schedule an appointment with Dr. Marla Roe for reevaluate.//AB/CMA

## 2020-04-26 NOTE — Progress Notes (Signed)
Office Visit    Patient Name: Stacey Roberts Date of Encounter: 04/28/2020  PCP:  Jearld Fenton, NP   [facility]  Cardiologist:  Nelva Bush, MD  Advanced Practice Provider:  No care team member to display Electrophysiologist:  None   Chief Complaint    Stacey Roberts is a 55 y.o. female with a hx of CAD, DM 2, hypertension, hyperlipidemia, former tobacco use (quit 2017), breast cancer presents today for follow-up of coronary artery disease  Past Medical History    Past Medical History:  Diagnosis Date  . Allergy    seasonal  . BRCA negative 2017  . Breast cancer ([place]) 2014  . Breast cancer ([place]) 2017  . Bronchitis    hx of  . Coronary artery disease 06/2019   80% mid RCA stenosis - medical management  . Diabetes mellitus, type II ([place])   . Endometrial hyperplasia 2014   Mirena placed; Dr. Carren Rang  . GERD (gastroesophageal reflux disease)   . Headache   . History of radiation therapy 12/01/15-01/11/16   Left breast DIBH / 50.4 Gy in 28 fractions and Left breast boost / 12 Gy in 6 fractions  . Hyperlipidemia   . Hypertension   . Hypothyroidism   . Joint pain   . Obesity   . PCOS (polycystic ovarian syndrome)   . Personal history of radiation therapy 2017  . Pneumonia    as a child  . Sleep apnea 2008   severe OSA-does not use a cpap  . Snoring   . 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 Buddy Duty   Past Surgical History:  Procedure Laterality Date  . BREAST BIOPSY  2000   s/p  . BREAST BIOPSY Left 01/22/2013   Procedure: RE-EXCISION LEFT BREAST DCIS;  Surgeon: Harl Bowie, MD;  Location: [facility];  Service: General;  Laterality: Left;  . BREAST LUMPECTOMY Left 01/06/2013   Procedure: LUMPECTOMY;  Surgeon: Harl Bowie, MD;  Location: [facility];  Service: General;  Laterality: Left;  . BREAST  LUMPECTOMY Left 2017  . BREAST LUMPECTOMY WITH NEEDLE LOCALIZATION AND AXILLARY SENTINEL LYMPH NODE BX Left 09/30/2015   Procedure: Left BREAST LUMPECTOMY WITH NEEDLE LOCALIZATION AND AXILLARY SENTINEL LYMPH NODE BX;  Surgeon: Coralie Keens, MD;  Location: [facility];  Service: General;  Laterality: Left;  . BREAST RECONSTRUCTION Bilateral 10/12/2015   Procedure: BREAST oncoplastic RECONSTRUCTION;  Surgeon: Irene Limbo, MD;  Location: [facility];  Service: Plastics;  Laterality: Bilateral;  . BREAST REDUCTION SURGERY Bilateral 10/12/2015   Procedure: MAMMARY REDUCTION  (BREAST);  Surgeon: Irene Limbo, MD;  Location: [facility];  Service: Plastics;  Laterality: Bilateral;  . DILATION AND CURETTAGE OF UTERUS  2004   s/p  . EXCISION OF BREAST LESION Left 10/12/2015   Procedure: Re-EXCISION OF BREAST;  Surgeon: Coralie Keens, MD;  Location: [facility];  Service: General;  Laterality: Left;  . LEFT HEART CATH AND CORONARY ANGIOGRAPHY N/A 06/27/2019   Procedure: LEFT HEART CATH AND CORONARY ANGIOGRAPHY;  Surgeon: Nelva Bush, MD;  Location: [facility];  Service: Cardiovascular;  Laterality: N/A;  . REDUCTION MAMMAPLASTY Bilateral 09/2015  . THYROIDECTOMY  02/2009   Dr Harlow Asa  . TONSILLECTOMY  1982    Allergies  Allergies  Allergen Reactions  . Penicillins Shortness Of Breath, Rash and Other (See Comments)  Has patient had a PCN reaction causing immediate rash, facial/tongue/throat swelling, SOB or lightheadedness with hypotension: Yes Has patient had a PCN reaction causing severe rash involving mucus membranes or skin necrosis: Yes Has patient had a PCN reaction that required hospitalization No Has patient had a PCN reaction occurring within the last 10 years: Yes If all of the above answers are "NO", then may proceed with Cephalosporin use.   . Clindamycin/Lincomycin Other (See Comments)    Severe stomach cramps  . Ace  Inhibitors Other (See Comments) and Cough    REACTION: cough; denies airway involvement     History of Present Illness    Stacey Roberts is a 55 y.o. female with a hx of coronary artery disease, DM2, hypertension, hyperlipidemia, former tobacco use (quit 2017), breast cancer.  She was last seen 10/29/19.  Strong family history of coronary artery disease.  She had a stress test many years ago which was unrevealing per her report.  Seen in 2014 by Dr. Percival Spanish for evaluation of palpitations with notation of atrial bigeminy.  Echo at that time with mild LVH and otherwise unremarkable.   Presented to [facility] ED April 2021 with chest pain palpitations.  ED work-up unrevealing.  She presented for clinic follow-up for chest pain and palpitations.  She was started on aspirin 81 mg daily and metoprolol tartrate 25 mg twice daily continue losartan.  She was recommended for echo and cardiac CTA.  Echo 05/29/2019 LVEF 60 to 65% with no regional wall motion abnormalities and trivial AI.  She had cardiac CTA with calcium score of 1373 placing her in the 99th percentile for age and sex.  Her FFR showed significant stenosis of the LCx, OM1, RCA she was recommended for cardiac catheterization.  LHC 06/27/2019 showing significant single-vessel CAD with calcified stenosis up to 80% involving mid portion of small RCA with mild nonobstructive disease of LAD and LCx.  Mildly elevated filling pressures.  Recommended for optimizing medical therapy, metoprolol increased to 50 mg twice daily.  Noted that if she had refractory symptoms despite maximal tolerated just at least 2 antianginal agents PCI to the RCA could be considered.   Encouraged hospital follow-up July 2021 she was feeling well.  She reported rare palpitations and it was decided to defer ZIO monitor.  In follow-up October 2021 with Dr. Saunders Revel she noted occasional palpitations which are twice per month and no chest pain, pressure, tightness.  No changes were made at  that time.  She presents today for follow-up. She works as the Fish farm manager for Engineer, technical sales for [employer]. Shares with me that a good friend of hers had a stroke this weekend and is presently at [facility] in the inpatient rehab. Offered my condolences, she says he is improving with some mild weakness still.   She tells me she has been doing overall well from a cardiac perspective. Pleased to learn she has lost 9 pounds. Her A1c when checked last month has improved from 10.1 to 7.3. She has made significant changes to her diet. Her goal was to get her A1c to <8 so that she could have latissimus muscle flap with Dr. Baltazar Apo due to changes in tissue after previous partial mastectomy.  Reports her palpitations are still only very rare and more noticeable when she eats an unhealthy meal that is higher in fat. Reports no shortness of breath nor dyspnea on exertion. Reports no chest pain, pressure, or tightness. No edema, orthopnea, PND. Reports no palpitations. She  notes occasional lightheadedness with getting out of bed first thing in the morning but this self resolves, we discussed orthostatic hypotension and preventative measures.  EKGs/Labs/Other Studies Reviewed:   The following studies were reviewed today:  Echo 05/29/19  1. Left ventricular ejection fraction, by estimation, is 60 to 65%. The  left ventricle has normal function. The left ventricle has no regional  wall motion abnormalities. Left ventricular diastolic parameters were  normal.   2. Right ventricular systolic function is normal. The right ventricular  size is normal.   3. The mitral valve is grossly normal. No evidence of mitral valve  regurgitation.   4. The aortic valve is tricuspid. Aortic valve regurgitation is trivial.  No aortic stenosis is present.   5. The inferior vena cava is normal in size with greater than 50%  respiratory variability, suggesting right atrial pressure of 3 mmHg.   Cardiac CTA and FFRct  06/12/19 ADDENDUM REPORT: 06/14/2019 15:35 EXAM: CT FFR ANALYSIS CLINICAL DATA:  71F with abnormal coronary CT-A. FINDINGS: Normal FFR range is >0.80. 1. Left Main:  No significant stenosis.  FFRct 0.99. 2. LAD: No significant stenosis. FFRct 0.96 proximal, 0.83 mid, 0.73 distal. 3. LCX: Significant stenosis. FFRct 0.94 proximal, 0.64 distal (abnormal). OM1 0.66 (abnormal). 4. RCA: Significant stenosis. FFRct 0.99 proximal, 0.72 mid, 0.59 distal. IMPRESSION: 1.  CT FFR indicates significant stenosis in the LCX, OM1, and RCA. 2.  LAD FFRct indeterminate. IMPRESSION: Aorta: Normal size. Ascending aorta 3.4 cm. Mild calcification of the aortic root. No dissection. Aortic Valve:  Trileaflet.  No calcifications. Coronary Arteries:  Normal coronary origin.  Right dominance.  RCA is a large dominant artery that gives rise to PDA and PLVB. There is severe (>70%) calcified plaque in the mid RCA and severe low attenuation plaque in the distal RCA. Left main is a large artery that gives rise to LAD and LCX arteries. There is minimal (<25%) calcified plaque. LAD is a large vessel that is diffusely diseased and heavily calcified. There is moderate (50-69%) diffuse calcified plaque. There are two small diagonal vessels. LCX is a non-dominant artery that gives rise to one large OM1 branch. There is diffuse mild to moderate (<70%) mixed plaque. There is a large OM1 with severe (>70%) mixed plaque proximally and moderate (50-69%) mixed plaque in the mid to distal vessel. Other findings: Normal pulmonary vein drainage into the left atrium. Normal let atrial appendage without a thrombus. Normal size of the pulmonary artery. 1. Coronary calcium score of 1373. This was 99th percentile for age and sex matched control. 2. Normal coronary origin with right dominance. 3. Coronary arteries are diffusely diseased. There is severe (>70%) plaque in the RCA and OM1. CAD RADS 4. 4.  Will send study for  FFRct.   LHC 06/27/19 Conclusions: 1. Significant single-vessel coronary artery disease with calcified stenosis of up to 80% involving mid portion of small RCA.  There is mild, non-obstructive disease involving the LAD and LCx. 2. Mildly elevated left ventricular filling pressure.   Recommendations: 1. Optimize medical therapy, including increasing metoprolol tartrate to 50 mg BID. 2. Continue aggressive lipid and blood sugar control to prevent progression of disease. 3. If the patient has refractory symptoms despite maximal tolerated doses of at least 2 antianginal agents, PCI to the RCA could be considered.  EKG:  EKG is  ordered today.  The ekg ordered today demonstrates NSR 85 bpm with no acute ST/T wave changes  Recent Labs: 02/03/2020: ALT 15; BUN 14; Creatinine, Ser  0.60; Hemoglobin 14.0; Platelets 220.0; Potassium 4.7; Sodium 140; TSH 0.41  Recent Lipid Panel    Component Value Date/Time   CHOL 121 02/03/2020 0854   CHOL 122 03/21/2017 1018   TRIG 115.0 02/03/2020 0854   HDL 51.80 02/03/2020 0854   HDL 41 03/21/2017 1018   CHOLHDL 2 02/03/2020 0854   VLDL 23.0 02/03/2020 0854   LDLCALC 46 02/03/2020 0854   LDLCALC 63 03/21/2017 1018   LDLDIRECT 153.0 09/01/2014 1634    Home Medications   Current Meds  Medication Sig  . aspirin EC 81 MG tablet Take 1 tablet (81 mg total) by mouth daily.  . Blood Glucose Monitoring Suppl (FREESTYLE LITE) DEVI 1 each by Does not apply route 2 (two) times daily. [name]  . calcium carbonate (OSCAL) 1500 (600 Ca) MG TABS tablet Take 600 mg of elemental calcium by mouth daily with breakfast.   . dapagliflozin propanediol (FARXIGA) 5 MG TABS tablet TAKE 1 TABLET BY MOUTH DAILY.  Marland Kitchen gabapentin (NEURONTIN) 300 MG capsule Take 2 capsules (600 mg total) by mouth at bedtime.  Marland Kitchen glucose blood (FREESTYLE LITE) test strip 1 each by Other route 2 (two) times daily. [name]  . Insulin Glargine-yfgn 100 UNIT/ML SOPN INJECT 60 UNDER THE SKIN EVERY MORNING  .  Insulin Glargine-yfgn 100 UNIT/ML SOPN INJECT 60 UNITS INTO THE SKIN EVERY MORNING. AND PEN NEEDLES 1/DAY  . Lancets (FREESTYLE) lancets 1 each by Other route 2 (two) times daily. [name]  . levothyroxine (SYNTHROID) 137 MCG tablet TAKE 1 TABLET (137 MCG TOTAL) BY MOUTH DAILY BEFORE BREAKFAST.  Marland Kitchen losartan (COZAAR) 50 MG tablet TAKE 1 TABLET (50 MG TOTAL) BY MOUTH DAILY.  . metFORMIN (GLUCOPHAGE-XR) 500 MG 24 hr tablet TAKE 4 TABLETS BY MOUTH DAILY WITH BREAKFAST  . Multiple Vitamins-Minerals (MULTIVITAMIN WITH MINERALS) tablet Take 1 tablet by mouth daily. Woman's Gummies  . nitroGLYCERIN (NITROSTAT) 0.4 MG SL tablet Place 1 tablet (0.4 mg total) under the tongue every 5 (five) minutes as needed for chest pain.  . pantoprazole (PROTONIX) 40 MG tablet TAKE 1 TABLET BY MOUTH DAILY.  Marland Kitchen pregabalin (LYRICA) 75 MG capsule TAKE 1 CAPSULE (75 MG TOTAL) BY MOUTH 2 (TWO) TIMES DAILY.  . rosuvastatin (CRESTOR) 20 MG tablet TAKE 1 TABLET BY MOUTH DAILY.  Marland Kitchen Semaglutide,0.25 or 0.5MG/DOS, 2 MG/1.5ML SOPN INJECT 0.5 MG INTO THE SKIN ONCE A WEEK.  . Semaglutide,0.25 or 0.5MG/DOS, 2 MG/1.5ML SOPN INJECT 0.1875 MLS (0.25 MG TOTAL) INTO THE SKIN ONCE A WEEK.  . tamoxifen (NOLVADEX) 20 MG tablet TAKE 1 TABLET BY MOUTH DAILY.  . traMADol (ULTRAM) 50 MG tablet TAKE 1 TABLET BY MOUTH EVERY 6 HOURS AS NEEDED FOR PAIN (Patient taking differently: Take by mouth every 6 (six) hours as needed. for pain)  . [DISCONTINUED] metoprolol tartrate (LOPRESSOR) 50 MG tablet TAKE 1 TABLET BY MOUTH TWO TIMES DAILY     Review of Systems  \All other systems reviewed and are otherwise negative except as noted above.  Physical Exam    VS:  BP 112/68   Pulse 85   Ht 5' 4" (1.626 m)   Wt 222 lb (100.7 kg)   BMI 38.11 kg/m  , BMI Body mass index is 38.11 kg/m.  Wt Readings from Last 3 Encounters:  04/28/20 222 lb (100.7 kg)  04/08/20 223 lb 6.4 oz (101.3 kg)  03/11/20 231 lb (104.8 kg)    GEN: Well nourished, overweight, well  developed, in no acute distress. HEENT: normal. Neck: Supple, no JVD, carotid  bruits, or masses. Cardiac: RRR, no murmurs, rubs, or gallops. No clubbing, cyanosis, edema.  Radials/PT 2+ and equal bilaterally.  Respiratory:  Respirations regular and unlabored, clear to auscultation bilaterally. GI: Soft, nontender, nondistended. MS: No deformity or atrophy. Skin: Warm and dry, no rash. Neuro:  Strength and sensation are intact. Psych: Normal affect.  Assessment & Plan    1. CAD- LHC 06/2019 with significant single-vessel coronary disease with calcified stenosis up to 80% of mid portion of small RCA and mild, nonobstructive disease involving LAD/LCx. She was recommended for medical therapy. Stable with no anginal symptoms. EKG today with no acute ST/T wave changes. No indication for ischemic evaluation at this time. GDMT includes Aspirin, Metoprolol, Rosuvastatin, PRN nitroglycerin. Heart healthy diet and regular cardiovascular exercise encouraged.   2. Palpitations - Reports rare fleeting palpitations which seem to be triggered by eating occasional higher fat meals. We agreed to defer ZIO monitor as symptoms self resolve and are overall not bothersome. Likely etiology PVC's or PAC's. Continue Metoprolol Tartrate 46m BID. Refill provided. If palpitations increase in frequency or severity, we will reconsider ZIO.  3. Hypertension- BP well controlled. Continue current antihypertensive regimen including Lopressor 560mBID and Losartan 503mD. Reports occasional orthostasis when first waking. Discussed preventative measures such as making position changes slowly. If she has persistent orthostasis, could consider reduced dose of Losartan.  4. Hyperlipidemia, LDL goal less than 70-1/11/2020 LDL 46. Continue Rosuvastatin 36m83mily.   5. Obesity - Weight loss via diet and exercise encouraged. Congratulated her on 9 pound weight loss since last seen. Discussed the impact being overweight would have on  cardiovascular risk.  6. DM2-04/08/2020 A1c 7.3. Congratulated on improvement and weight loss. Continue to follow with Dr. ElliLoanne Drillingendocrinology. Appreciate inclusion of SGLT2i.  Disposition: Follow up in 6 month(s) with Dr. End Saunders RevelAPP  Signed, CaitLoel Dubonnet 04/28/2020, 8:25 AM [facility]

## 2020-04-27 ENCOUNTER — Telehealth: Payer: Self-pay | Admitting: Oncology

## 2020-04-27 ENCOUNTER — Other Ambulatory Visit: Payer: Self-pay

## 2020-04-27 NOTE — Telephone Encounter (Signed)
Rescheduled upcoming appointment per 4/5 schedule message. Patient is aware of changes. 

## 2020-04-28 ENCOUNTER — Encounter: Payer: Self-pay | Admitting: Family

## 2020-04-28 ENCOUNTER — Other Ambulatory Visit: Payer: Self-pay

## 2020-04-28 ENCOUNTER — Ambulatory Visit (INDEPENDENT_AMBULATORY_CARE_PROVIDER_SITE_OTHER): Payer: No Typology Code available for payment source | Admitting: Family

## 2020-04-28 VITALS — BP 112/68 | HR 85 | Ht 64.0 in | Wt 222.0 lb

## 2020-04-28 DIAGNOSIS — I10 Essential (primary) hypertension: Secondary | ICD-10-CM | POA: Diagnosis not present

## 2020-04-28 DIAGNOSIS — E785 Hyperlipidemia, unspecified: Secondary | ICD-10-CM

## 2020-04-28 DIAGNOSIS — R002 Palpitations: Secondary | ICD-10-CM

## 2020-04-28 DIAGNOSIS — I25118 Atherosclerotic heart disease of native coronary artery with other forms of angina pectoris: Secondary | ICD-10-CM

## 2020-04-28 DIAGNOSIS — E1165 Type 2 diabetes mellitus with hyperglycemia: Secondary | ICD-10-CM

## 2020-04-28 MED ORDER — METOPROLOL TARTRATE 50 MG PO TABS
ORAL_TABLET | Freq: Two times a day (BID) | ORAL | 1 refills | Status: DC
  Filled 2020-04-28: qty 180, 90d supply, fill #0
  Filled 2020-09-30: qty 180, 90d supply, fill #1

## 2020-04-28 NOTE — Patient Instructions (Addendum)
Medication Instructions:  Continue your current medications.   *If you need a refill on your cardiac medications before your next appointment, please call your pharmacy*  Lab Work: None ordered today.   Testing/Procedures: Your EKG today showed normal sinus rhythm.   Follow-Up: At [facility], you and your health needs are our priority.  As part of our continuing mission to provide you with exceptional heart care, we have created designated Provider Care Teams.  These Care Teams include your primary Cardiologist (physician) and Advanced Practice Providers (APPs -  Physician Assistants and Nurse Practitioners) who all work together to provide you with the care you need, when you need it.  We recommend signing up for the patient portal called "MyChart".  Sign up information is provided on this After Visit Summary.  MyChart is used to connect with patients for Virtual Visits (Telemedicine).  Patients are able to view lab/test results, encounter notes, upcoming appointments, etc.  Non-urgent messages can be sent to your provider as well.   To learn more about what you can do with MyChart, go to [email or web address].    Your next appointment:   6 month(s)  The format for your next appointment:   In Person  Provider:   You may see Nelva Bush, MD or one of the following Advanced Practice Providers on your designated Care Team:    Murray Hodgkins, NP  Christell Faith, PA-C  Marrianne Mood, PA-C  Cadence Kathlen Mody, [place]  Laurann Montana, NP   Other Instructions  Heart Healthy Diet Recommendations: A low-salt diet is recommended. Meats should be grilled, baked, or boiled. Avoid fried foods. Focus on lean protein sources like fish or chicken with vegetables and fruits. The American Heart Association is a Microbiologist!  American Heart Association Diet and Lifeystyle Recommendations   Exercise recommendations: The American Heart Association recommends 150 minutes of moderate  intensity exercise weekly. Try 30 minutes of moderate intensity exercise 4-5 times per week. This could include walking, jogging, or swimming.

## 2020-05-10 ENCOUNTER — Other Ambulatory Visit: Payer: Self-pay

## 2020-05-13 ENCOUNTER — Ambulatory Visit: Payer: No Typology Code available for payment source | Admitting: Oncology

## 2020-05-13 ENCOUNTER — Other Ambulatory Visit: Payer: No Typology Code available for payment source

## 2020-05-14 ENCOUNTER — Other Ambulatory Visit: Payer: Self-pay

## 2020-05-14 ENCOUNTER — Encounter: Payer: Self-pay | Admitting: Plastic Surgery

## 2020-05-14 ENCOUNTER — Ambulatory Visit (INDEPENDENT_AMBULATORY_CARE_PROVIDER_SITE_OTHER): Payer: No Typology Code available for payment source | Admitting: Plastic Surgery

## 2020-05-14 VITALS — BP 139/89

## 2020-05-14 DIAGNOSIS — Z923 Personal history of irradiation: Secondary | ICD-10-CM

## 2020-05-14 DIAGNOSIS — N6489 Other specified disorders of breast: Secondary | ICD-10-CM | POA: Diagnosis not present

## 2020-05-14 NOTE — Progress Notes (Signed)
Patient ID: Stacey Roberts, female    DOB: 01-Jan-1966, 55 y.o.   MRN: [id number]   Chief Complaint  Patient presents with  . Follow-up  . Breast Problem    The patient is a 55 year old female here for reevaluation of her left breast.  She was diagnosed with breast cancer in 2014 and underwent lumpectomy and declined radiation.  It was estrogen and progesterone positive at the time.  She then had a cluster of calcifications and a reexcision 2017 in the upper outer quadrant of the left breast.  She had positive margins and underwent a reexcision.  The sentinel lymph nodes were negative.  Her hemoglobin A1c was higher than 8 at the time.  She decided on bilateral oncoplastic breast reductions which was done by another surgeon.  She then had postoperative radiation.  Since then she has noticed increasing pain and tightness in the left lateral breast area.  The skin is markedly different and very firm and taut.  Her past medical history includes a hysterectomy and a thyroidectomy.  On her last visit she had a hemoglobin A1c in the 10 range.  As of March she has been able to lower it to 7.3.  She is now interested in surgical treatment due to the pain and hardness and discomfort of the left breast.   Review of Systems  Constitutional: Negative.   HENT: Negative.   Eyes: Negative.   Respiratory: Positive for chest tightness. Negative for shortness of breath.   Cardiovascular: Negative for leg swelling.  Gastrointestinal: Negative for abdominal distention and abdominal pain.  Endocrine: Negative.   Genitourinary: Negative.   Skin: Positive for color change.  Neurological: Negative.   Hematological: Negative.   Psychiatric/Behavioral: Negative.     Past Medical History:  Diagnosis Date  . Allergy    seasonal  . BRCA negative 2017  . Breast cancer ([place]) 2014  . Breast cancer ([place]) 2017  . Bronchitis    hx of  . Coronary artery disease 06/2019   80% mid RCA stenosis - medical management   . Diabetes mellitus, type II ([place])   . Endometrial hyperplasia 2014   Mirena placed; Dr. Carren Rang  . GERD (gastroesophageal reflux disease)   . Headache   . History of radiation therapy 12/01/15-01/11/16   Left breast DIBH / 50.4 Gy in 28 fractions and Left breast boost / 12 Gy in 6 fractions  . Hyperlipidemia   . Hypertension   . Hypothyroidism   . Joint pain   . Obesity   . PCOS (polycystic ovarian syndrome)   . Personal history of radiation therapy 2017  . Pneumonia    as a child  . Sleep apnea 2008   severe OSA-does not use a cpap  . Snoring   . 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 Buddy Duty    Past Surgical History:  Procedure Laterality Date  . BREAST BIOPSY  2000   s/p  . BREAST BIOPSY Left 01/22/2013   Procedure: RE-EXCISION LEFT BREAST DCIS;  Surgeon: Harl Bowie, MD;  Location: [facility];  Service: General;  Laterality: Left;  . BREAST LUMPECTOMY Left 01/06/2013   Procedure: LUMPECTOMY;  Surgeon: Harl Bowie, MD;  Location: [facility];  Service: General;  Laterality: Left;  . BREAST LUMPECTOMY Left 2017  . BREAST LUMPECTOMY WITH NEEDLE LOCALIZATION AND AXILLARY SENTINEL LYMPH NODE BX Left 09/30/2015  Procedure: Left BREAST LUMPECTOMY WITH NEEDLE LOCALIZATION AND AXILLARY SENTINEL LYMPH NODE BX;  Surgeon: Coralie Keens, MD;  Location: [facility];  Service: General;  Laterality: Left;  . BREAST RECONSTRUCTION Bilateral 10/12/2015   Procedure: BREAST oncoplastic RECONSTRUCTION;  Surgeon: Irene Limbo, MD;  Location: [facility];  Service: Plastics;  Laterality: Bilateral;  . BREAST REDUCTION SURGERY Bilateral 10/12/2015   Procedure: MAMMARY REDUCTION  (BREAST);  Surgeon: Irene Limbo, MD;  Location: [facility];  Service: Plastics;  Laterality: Bilateral;  . DILATION AND CURETTAGE OF UTERUS  2004   s/p  . EXCISION  OF BREAST LESION Left 10/12/2015   Procedure: Re-EXCISION OF BREAST;  Surgeon: Coralie Keens, MD;  Location: [facility];  Service: General;  Laterality: Left;  . LEFT HEART CATH AND CORONARY ANGIOGRAPHY N/A 06/27/2019   Procedure: LEFT HEART CATH AND CORONARY ANGIOGRAPHY;  Surgeon: Nelva Bush, MD;  Location: [facility];  Service: Cardiovascular;  Laterality: N/A;  . REDUCTION MAMMAPLASTY Bilateral 09/2015  . THYROIDECTOMY  02/2009   Dr Harlow Asa  . TONSILLECTOMY  1982      Current Outpatient Medications:  .  aspirin EC 81 MG tablet, Take 1 tablet (81 mg total) by mouth daily., Disp: 90 tablet, Rfl: 3 .  Blood Glucose Monitoring Suppl (FREESTYLE LITE) DEVI, 1 each by Does not apply route 2 (two) times daily. [name], Disp: 1 each, Rfl: 0 .  calcium carbonate (OSCAL) 1500 (600 Ca) MG TABS tablet, Take 600 mg of elemental calcium by mouth daily with breakfast. , Disp: , Rfl:  .  dapagliflozin propanediol (FARXIGA) 5 MG TABS tablet, TAKE 1 TABLET BY MOUTH DAILY., Disp: 90 tablet, Rfl: 3 .  gabapentin (NEURONTIN) 300 MG capsule, Take 2 capsules (600 mg total) by mouth at bedtime., Disp: 180 capsule, Rfl: 4 .  glucose blood (FREESTYLE LITE) test strip, 1 each by Other route 2 (two) times daily. [name], Disp: 200 each, Rfl: 0 .  Insulin Glargine-yfgn 100 UNIT/ML SOPN, INJECT 60 UNDER THE SKIN EVERY MORNING, Disp: 45 mL, Rfl: 99 .  Insulin Glargine-yfgn 100 UNIT/ML SOPN, INJECT 60 UNITS INTO THE SKIN EVERY MORNING. AND PEN NEEDLES 1/DAY, Disp: 60 mL, Rfl: 3 .  Lancets (FREESTYLE) lancets, 1 each by Other route 2 (two) times daily. [name], Disp: 200 each, Rfl: 0 .  levothyroxine (SYNTHROID) 137 MCG tablet, TAKE 1 TABLET (137 MCG TOTAL) BY MOUTH DAILY BEFORE BREAKFAST., Disp: 90 tablet, Rfl: 3 .  losartan (COZAAR) 50 MG tablet, TAKE 1 TABLET (50 MG TOTAL) BY MOUTH DAILY., Disp: 90 tablet, Rfl: 2 .  metFORMIN (GLUCOPHAGE-XR) 500 MG 24 hr tablet, TAKE 4 TABLETS BY MOUTH DAILY WITH  BREAKFAST, Disp: 360 tablet, Rfl: 3 .  metoprolol tartrate (LOPRESSOR) 50 MG tablet, TAKE 1 TABLET BY MOUTH TWO TIMES DAILY, Disp: 180 tablet, Rfl: 1 .  Multiple Vitamins-Minerals (MULTIVITAMIN WITH MINERALS) tablet, Take 1 tablet by mouth daily. Woman's Gummies, Disp: , Rfl:  .  nitroGLYCERIN (NITROSTAT) 0.4 MG SL tablet, Place 1 tablet (0.4 mg total) under the tongue every 5 (five) minutes as needed for chest pain., Disp: 25 tablet, Rfl: prn .  pantoprazole (PROTONIX) 40 MG tablet, TAKE 1 TABLET BY MOUTH DAILY., Disp: 90 tablet, Rfl: 0 .  pregabalin (LYRICA) 75 MG capsule, TAKE 1 CAPSULE (75 MG TOTAL) BY MOUTH 2 (TWO) TIMES DAILY., Disp: 180 capsule, Rfl: 3 .  rosuvastatin (CRESTOR) 20 MG tablet, TAKE 1 TABLET BY MOUTH DAILY., Disp: 90 tablet, Rfl: 3 .  Semaglutide,0.25 or 0.5MG/DOS, 2 MG/1.5ML SOPN, INJECT 0.5 MG INTO THE SKIN ONCE A WEEK., Disp: 4.5 mL, Rfl: 3 .  Semaglutide,0.25 or 0.5MG/DOS, 2 MG/1.5ML SOPN, INJECT 0.1875 MLS (0.25 MG TOTAL) INTO THE SKIN ONCE A WEEK., Disp: 1.5 mL, Rfl: 11 .  tamoxifen (NOLVADEX) 20 MG tablet, TAKE 1 TABLET BY MOUTH DAILY., Disp: 90 tablet, Rfl: 3 .  traMADol (ULTRAM) 50 MG tablet, TAKE 1 TABLET BY MOUTH EVERY 6 HOURS AS NEEDED FOR PAIN (Patient taking differently: Take by mouth every 6 (six) hours as needed. for pain), Disp: 60 tablet, Rfl: 0   Objective:   Vitals:   05/14/20 0956  BP: 139/89  SpO2: 98%    Physical Exam Vitals and nursing note reviewed.  Constitutional:      Appearance: Normal appearance.  HENT:     Head: Normocephalic and atraumatic.  Cardiovascular:     Rate and Rhythm: Normal rate.     Pulses: Normal pulses.  Pulmonary:     Effort: Pulmonary effort is normal. No respiratory distress.     Breath sounds: No wheezing.  Abdominal:     General: Abdomen is flat. There is no distension.     Tenderness: There is no abdominal tenderness.  Skin:    General: Skin is warm.     Capillary Refill: Capillary refill takes less than 2  seconds.     Coloration: Skin is not pale.     Findings: Bruising and erythema present. No rash.  Neurological:     General: No focal deficit present.     Mental Status: She is alert and oriented to person, place, and time.  Psychiatric:        Mood and Affect: Mood normal.     Assessment & Plan:  Postoperative breast asymmetry  Status post radiation therapy  We had a long discussion about her options for reconstruction which include a TRAM, a Diep flap and a latissimus muscle flap.  She would like to move ahead with the latissimus muscle flap.  At some point she would then be a candidate for a mastopexy reduction on the right side.  Pictures were obtained of the patient and placed in the chart with the patient's or guardian's permission.   Anderson, DO

## 2020-05-15 ENCOUNTER — Encounter (INDEPENDENT_AMBULATORY_CARE_PROVIDER_SITE_OTHER): Payer: Self-pay

## 2020-05-25 ENCOUNTER — Other Ambulatory Visit: Payer: Self-pay

## 2020-05-25 ENCOUNTER — Other Ambulatory Visit: Payer: Self-pay | Admitting: Podiatry

## 2020-05-25 MED ORDER — PREGABALIN 75 MG PO CAPS
ORAL_CAPSULE | ORAL | 3 refills | Status: DC
  Filled 2020-05-25: qty 180, 90d supply, fill #0

## 2020-05-25 MED FILL — Metformin HCl Tab ER 24HR 500 MG: ORAL | 90 days supply | Qty: 360 | Fill #0 | Status: AC

## 2020-05-25 MED FILL — Losartan Potassium Tab 50 MG: ORAL | 90 days supply | Qty: 90 | Fill #0 | Status: AC

## 2020-05-25 MED FILL — Dapagliflozin Propanediol Tab 5 MG (Base Equivalent): ORAL | 90 days supply | Qty: 90 | Fill #0 | Status: AC

## 2020-05-25 NOTE — Telephone Encounter (Signed)
Please advise 

## 2020-05-26 ENCOUNTER — Other Ambulatory Visit: Payer: Self-pay

## 2020-06-01 ENCOUNTER — Telehealth: Payer: Self-pay

## 2020-06-01 NOTE — Telephone Encounter (Signed)
Pt called stating she would like to cancel appt for 6/27 as she is having surgery that day, but would like to keep appt scheduled for August. Appts have been cancelled upon pt request.

## 2020-06-09 ENCOUNTER — Other Ambulatory Visit: Payer: Self-pay

## 2020-06-09 MED FILL — Insulin Glargine-yfgn Soln Pen-Injector 100 Unit/ML: SUBCUTANEOUS | 75 days supply | Qty: 45 | Fill #0 | Status: AC

## 2020-06-10 ENCOUNTER — Other Ambulatory Visit: Payer: Self-pay

## 2020-06-10 DIAGNOSIS — E1142 Type 2 diabetes mellitus with diabetic polyneuropathy: Secondary | ICD-10-CM

## 2020-06-10 MED ORDER — UNIFINE PENTIPS 31G X 8 MM MISC
3 refills | Status: DC
  Filled 2020-06-10: qty 100, 50d supply, fill #0

## 2020-06-11 ENCOUNTER — Other Ambulatory Visit: Payer: Self-pay

## 2020-06-28 NOTE — Progress Notes (Signed)
Patient ID: Stacey Roberts, female    DOB: 09-05-1965, 55 y.o.   MRN: [id number]  Chief Complaint  Patient presents with  . Pre-op Exam      ICD-10-CM   1. Postoperative breast asymmetry  N64.89   2. Status post radiation therapy  Z92.3   3. Malignant neoplasm of central portion of left breast in female, estrogen receptor positive (HCC)  C50.112    Z17.0   4. History of radiation exposure  Z92.3       History of Present Illness: Stacey Roberts is a 55 y.o.  female  with a history of left breast cancer which was diagnosed in 2014. She presents for preoperative evaluation for upcoming procedure, excision of left breast radiation necrosis and left latissimus myocutaneous muscle flap, scheduled for 07/19/2020 with Dr. Ulice Bold.  The patient has not had problems with anesthesia. No history of DVT/PE.  No family history of DVT/PE.  No family or personal history of bleeding or clotting disorders.  Patient is not currently taking any blood thinners.  No history of CVA/MI.   Summary of Previous Visit: Patient was diagnosed with breast cancer in 2014 and underwent lumpectomy and declined radiation.  It was ER and PR positive at the time.  She then had cluster calcifications and reexcision in 2017 in the upper outer quadrant of the left breast.  She had positive margins and underwent reexcision.  She had then decided on bilateral oncoplastic breast reductions which was done by another surgeon.  She then had postoperative radiation.  Since then she has noticed increased pain and tightness in the left lateral breast area the skin is markedly different and very firm and taut.  Job: Works in Careers adviser, works from home.  Planning 4 to 6 weeks of for recovery.  PMH Significant for: Diabetes mellitus with most recent A1c 7.3 on 04/08/2020.  It was 10.1 on 02/03/2020.  She also has a history of coronary artery disease, hypertension, hyperlipidemia. She is currently on tamoxifen.  Past Medical  History: Allergies: Allergies  Allergen Reactions  . Penicillins Shortness Of Breath, Rash and Other (See Comments)    Has patient had a PCN reaction causing immediate rash, facial/tongue/throat swelling, SOB or lightheadedness with hypotension: Yes Has patient had a PCN reaction causing severe rash involving mucus membranes or skin necrosis: Yes Has patient had a PCN reaction that required hospitalization No Has patient had a PCN reaction occurring within the last 10 years: Yes If all of the above answers are "NO", then may proceed with Cephalosporin use.   . Clindamycin/Lincomycin Other (See Comments)    Severe stomach cramps  . Ace Inhibitors Other (See Comments) and Cough    REACTION: cough; denies airway involvement     Current Medications:  Current Outpatient Medications:  .  aspirin EC 81 MG tablet, Take 1 tablet (81 mg total) by mouth daily., Disp: 90 tablet, Rfl: 3 .  Blood Glucose Monitoring Suppl (FREESTYLE LITE) DEVI, 1 each by Does not apply route 2 (two) times daily. [name], Disp: 1 each, Rfl: 0 .  calcium carbonate (OSCAL) 1500 (600 Ca) MG TABS tablet, Take 600 mg of elemental calcium by mouth daily with breakfast. , Disp: , Rfl:  .  dapagliflozin propanediol (FARXIGA) 5 MG TABS tablet, TAKE 1 TABLET BY MOUTH DAILY., Disp: 90 tablet, Rfl: 3 .  gabapentin (NEURONTIN) 300 MG capsule, Take 2 capsules (600 mg total) by mouth at bedtime., Disp: 180 capsule, Rfl: 4 .  glucose  blood (FREESTYLE LITE) test strip, 1 each by Other route 2 (two) times daily. [name], Disp: 200 each, Rfl: 0 .  Insulin Glargine-yfgn 100 UNIT/ML SOPN, INJECT 60 UNDER THE SKIN EVERY MORNING, Disp: 45 mL, Rfl: 99 .  Insulin Glargine-yfgn 100 UNIT/ML SOPN, INJECT 60 UNITS INTO THE SKIN EVERY MORNING. AND PEN NEEDLES 1/DAY, Disp: 60 mL, Rfl: 3 .  Insulin Pen Needle (UNIFINE PENTIPS) 31G X 8 MM MISC, use to inject twice a day, Disp: 100 each, Rfl: 3 .  Lancets (FREESTYLE) lancets, 1 each by Other route 2 (two)  times daily. [name], Disp: 200 each, Rfl: 0 .  levothyroxine (SYNTHROID) 137 MCG tablet, TAKE 1 TABLET (137 MCG TOTAL) BY MOUTH DAILY BEFORE BREAKFAST., Disp: 90 tablet, Rfl: 3 .  losartan (COZAAR) 50 MG tablet, TAKE 1 TABLET (50 MG TOTAL) BY MOUTH DAILY., Disp: 90 tablet, Rfl: 2 .  metFORMIN (GLUCOPHAGE-XR) 500 MG 24 hr tablet, TAKE 4 TABLETS BY MOUTH DAILY WITH BREAKFAST, Disp: 360 tablet, Rfl: 3 .  metoprolol tartrate (LOPRESSOR) 50 MG tablet, TAKE 1 TABLET BY MOUTH TWO TIMES DAILY, Disp: 180 tablet, Rfl: 1 .  Multiple Vitamins-Minerals (MULTIVITAMIN WITH MINERALS) tablet, Take 1 tablet by mouth daily. Woman's Gummies, Disp: , Rfl:  .  nitroGLYCERIN (NITROSTAT) 0.4 MG SL tablet, Place 1 tablet (0.4 mg total) under the tongue every 5 (five) minutes as needed for chest pain., Disp: 25 tablet, Rfl: prn .  pantoprazole (PROTONIX) 40 MG tablet, TAKE 1 TABLET BY MOUTH DAILY., Disp: 90 tablet, Rfl: 0 .  pregabalin (LYRICA) 75 MG capsule, TAKE 1 CAPSULE (75 MG TOTAL) BY MOUTH 2 (TWO) TIMES DAILY., Disp: 180 capsule, Rfl: 3 .  rosuvastatin (CRESTOR) 20 MG tablet, TAKE 1 TABLET BY MOUTH DAILY., Disp: 90 tablet, Rfl: 3 .  Semaglutide,0.25 or 0.5MG /DOS, 2 MG/1.5ML SOPN, INJECT 0.5 MG INTO THE SKIN ONCE A WEEK., Disp: 4.5 mL, Rfl: 3 .  Semaglutide,0.25 or 0.5MG /DOS, 2 MG/1.5ML SOPN, INJECT 0.1875 MLS (0.25 MG TOTAL) INTO THE SKIN ONCE A WEEK., Disp: 1.5 mL, Rfl: 11 .  tamoxifen (NOLVADEX) 20 MG tablet, TAKE 1 TABLET BY MOUTH DAILY., Disp: 90 tablet, Rfl: 3 .  traMADol (ULTRAM) 50 MG tablet, TAKE 1 TABLET BY MOUTH EVERY 6 HOURS AS NEEDED FOR PAIN (Patient taking differently: Take by mouth every 6 (six) hours as needed. for pain), Disp: 60 tablet, Rfl: 0  Past Medical Problems: Past Medical History:  Diagnosis Date  . Allergy    seasonal  . BRCA negative 2017  . Breast cancer ([facility]) 2014  . Breast cancer ([facility]) 2017  . Bronchitis    hx of  . Coronary artery disease 06/2019   80% mid RCA stenosis - medical  management  . Diabetes mellitus, type II ([facility])   . Endometrial hyperplasia 2014   Mirena placed; Dr. Chevis Pretty  . GERD (gastroesophageal reflux disease)   . Headache   . History of radiation therapy 12/01/15-01/11/16   Left breast DIBH / 50.4 Gy in 28 fractions and Left breast boost / 12 Gy in 6 fractions  . Hyperlipidemia   . Hypertension   . Hypothyroidism   . Joint pain   . Obesity   . PCOS (polycystic ovarian syndrome)   . Personal history of radiation therapy 2017  . Pneumonia    as a child  . Sleep apnea 2008   severe OSA-does not use a cpap  . Snoring   . 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 Sharl Ma    Past Surgical History: Past Surgical History:  Procedure Laterality Date  . BREAST BIOPSY  2000   s/p  . BREAST BIOPSY Left 01/22/2013   Procedure: RE-EXCISION LEFT BREAST DCIS;  Surgeon: Shelly Rubenstein, MD;  Location: [facility];  Service: General;  Laterality: Left;  . BREAST LUMPECTOMY Left 01/06/2013   Procedure: LUMPECTOMY;  Surgeon: Shelly Rubenstein, MD;  [facility];  Service: General;  Laterality: Left;  . BREAST LUMPECTOMY Left 2017  . BREAST LUMPECTOMY WITH NEEDLE LOCALIZATION AND AXILLARY SENTINEL LYMPH NODE BX Left 09/30/2015   Procedure: Left BREAST LUMPECTOMY WITH NEEDLE LOCALIZATION AND AXILLARY SENTINEL LYMPH NODE BX;  Surgeon: [name], MD;  Location: [facility];  Service: General;  Laterality: Left;  . BREAST RECONSTRUCTION Bilateral 10/12/2015   Procedure: BREAST oncoplastic RECONSTRUCTION;  Surgeon: [name], MD;  Location: [facility];  Service: Plastics;  Laterality: Bilateral;  . BREAST REDUCTION SURGERY Bilateral 10/12/2015   Procedure: MAMMARY REDUCTION  (BREAST);  Surgeon: [name], MD;  Location: [facility];  Service: Plastics;  Laterality: Bilateral;  . DILATION AND CURETTAGE  OF UTERUS  2004   s/p  . EXCISION OF BREAST LESION Left 10/12/2015   Procedure: Re-EXCISION OF BREAST;  Surgeon: [name], MD;  Location: [facility];  Service: General;  Laterality: Left;  . LEFT HEART CATH AND CORONARY ANGIOGRAPHY N/A 06/27/2019   Procedure: LEFT HEART CATH AND CORONARY ANGIOGRAPHY;  Surgeon: Yvonne Kendall, MD;  Location: [facility];  Service: Cardiovascular;  Laterality: N/A;  . REDUCTION MAMMAPLASTY Bilateral 09/2015  . THYROIDECTOMY  02/2009   Dr Gerrit Friends  . TONSILLECTOMY  1982    Social History: Social History   Socioeconomic History  . Marital status: Single    Spouse name: Not on file  . Number of children: Not on file  . Years of education: Not on file  . Highest education level: Not on file  Occupational History    Employer: [employer]    Comment: Outpatient scheduling in radiology  Tobacco Use  . Smoking status: Former Smoker    Packs/day: 0.50    Types: Cigarettes    Quit date: 09/29/2007    Years since quitting: 12.7  . Smokeless tobacco: Never Used  . Tobacco comment: Smoked on and off for 20 years. Quit about 8 years ago (as of 08/2015)  Vaping Use  . Vaping Use: Never used  Substance and Sexual Activity  . Alcohol use: Not Currently    Alcohol/week: 0.0 standard drinks  . Drug use: No  . Sexual activity: Not Currently    Birth control/protection: I.U.D.  Other Topics Concern  . Not on file  Social History Narrative   The patient is divorced, moved to [place] in   2006 from [place].  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    No alcohol.  Tobacco use:  She quit 5 years ago.  She smoked on   and off for approximately 20 years.  No history of recreational drug   Use.Drinks two cups of coffee per work day.    Social Determinants of Health   Financial Resource Strain: Not on file  Food Insecurity: Not on file   Transportation Needs: Not on file  Physical Activity: Not on file  Stress: Not on  file  Social Connections: Not on file  Intimate Partner Violence: Not on file    Family History: Family History  Problem Relation Age of Onset  . Dementia Father        deceased age 54 secondary to dementia  . Bipolar disorder Father   . Emphysema Father   . Heart disease Father   . COPD Father        smoker  . Hypertension Father   . Depression Father   . Alcoholism Father   . CAD Mother 43       Died age 97 of CAD  . Heart disease Mother        CABG x 2  . Graves' disease Mother   . Hypertension Mother   . Thyroid disease Mother   . Heart attack Mother 52  . CAD Maternal Grandfather 60  . Heart disease Maternal Grandfather   . Heart attack Maternal Grandfather 64  . Other Sister 53       hysterectomy for fibroids  . Prostate cancer Brother        low grade; w/ surveillance  . Thyroid nodules Brother 59  . Heart disease Brother 33       CABG x 4  . Heart disease Sister        CABG x 2  . Heart attack Sister 30  . Fibroids Other        niece dx approx 36  . Hypertension Other   . Kidney cancer Cousin        maternal 1st cousin dx 30-47; former smoker  . Lung cancer Other        maternal great aunt (MGM's sister); not a smoker  . Cancer Other        nephew dx neuroblastoma at 39.19 years old  . Colon cancer Neg Hx   . Colon polyps Neg Hx     Review of Systems: Review of Systems  Constitutional: Negative.   Respiratory: Negative.   Cardiovascular: Negative for chest pain, palpitations and leg swelling.  Gastrointestinal: Negative.   Neurological: Negative.     Physical Exam: Vital Signs BP 136/84 (BP Location: Right Arm, Patient Position: Sitting, Cuff Size: Large)   Pulse (!) 101   Ht 5\' 4"  (1.626 m)   Wt 219 lb (99.3 kg)   SpO2 94%   BMI 37.59 kg/m   Physical Exam Constitutional:      General: Not in acute distress.    Appearance: Normal appearance. Not  ill-appearing.  HENT:     Head: Normocephalic and atraumatic.  Eyes:     Pupils: Pupils are equal, round Neck:     Musculoskeletal: Normal range of motion.  Cardiovascular:     Rate and Rhythm: Normal rate    Pulses: Normal pulses.  Pulmonary:     Effort: Pulmonary effort is normal. No respiratory distress.  Abdominal:     General: Abdomen is flat. There is no distension.  Musculoskeletal: Normal range of motion.  Skin:    General: Skin is warm and dry.     Findings: No erythema or rash.  Neurological:     General: No focal deficit present.     Mental Status: Alert and oriented to person, place, and time. Mental status is at baseline.     Motor: No weakness.  Psychiatric:        Mood and Affect: Mood normal.        Behavior: Behavior normal.    Assessment/Plan: The patient  is scheduled for excision of left breast radiation necrosis and left latissimus myocutaneous muscle flap with Dr. Ulice Bold.  Risks, benefits, and alternatives of procedure discussed, questions answered and consent obtained.    Smoking Status: Quit 1-1/2 years ago; Counseling Given?  N/A Last Mammogram: MRI of bilateral breast on 10/01/2019; Results: No mass or abnormal enhancement of the right breast.  Recommend skin biopsy of left breast.  Skin biopsy showed no malignancy.  Caprini Score: 7, high; Risk Factors include: Age, BMI greater than 25, history of cancer, currently on hormone replacement therapy and length of planned surgery. Recommendation for mechanical and pharmacological prophylaxis while hospitalized. Encourage early ambulation.   Pictures obtained:[email or web address]   Post-op Rx sent to pharmacy: Percocet, Zofran, Cipro  Patient was provided with the General Surgical Risk consent document and Pain Medication Agreement prior to their appointment.  They had adequate time to read through the risk consent documents and Pain Medication Agreement. We also discussed them in person together during this preop  appointment. All of their questions were answered to their satisfaction.  Recommended calling if they have any further questions.  Risk consent form and Pain Medication Agreement to be scanned into patient's chart.  We discussed the risks associated with latissimus myocutaneous muscle flap of the left breast.  We discussed the use of postoperative drains.  We discussed risks and complications including but not limited to bleeding, infection, damage to surrounding structures including nerves, blood vessels, muscles.  We discussed postoperative risks of DVT/VTE.  Preoperative clearance sent the patient's cardiology team.  Will need to hold aspirin prior to surgery. Clearance also sent to oncology to determine if patient is able to hold tamoxifen pre and postoperatively.  Recommend holding multivitamin 1 to 2 weeks prior to surgery.  We discussed patient's personal risk factors for postoperative complications including but not limited to history of radiation and diabetes mellitus with A1c of 7.3.   Electronically signed by: Kermit Balo Vannah Nadal, PA-C 06/29/2020 10:44 AM

## 2020-06-28 NOTE — H&P (View-Only) (Signed)
Patient ID: Stacey Roberts, female    DOB: 09-05-1965, 55 y.o.   MRN: [id number]  Chief Complaint  Patient presents with  . Pre-op Exam      ICD-10-CM   1. Postoperative breast asymmetry  N64.89   2. Status post radiation therapy  Z92.3   3. Malignant neoplasm of central portion of left breast in female, estrogen receptor positive (HCC)  C50.112    Z17.0   4. History of radiation exposure  Z92.3       History of Present Illness: Stacey Roberts is a 55 y.o.  female  with a history of left breast cancer which was diagnosed in 2014. She presents for preoperative evaluation for upcoming procedure, excision of left breast radiation necrosis and left latissimus myocutaneous muscle flap, scheduled for 07/19/2020 with Dr. Ulice Bold.  The patient has not had problems with anesthesia. No history of DVT/PE.  No family history of DVT/PE.  No family or personal history of bleeding or clotting disorders.  Patient is not currently taking any blood thinners.  No history of CVA/MI.   Summary of Previous Visit: Patient was diagnosed with breast cancer in 2014 and underwent lumpectomy and declined radiation.  It was ER and PR positive at the time.  She then had cluster calcifications and reexcision in 2017 in the upper outer quadrant of the left breast.  She had positive margins and underwent reexcision.  She had then decided on bilateral oncoplastic breast reductions which was done by another surgeon.  She then had postoperative radiation.  Since then she has noticed increased pain and tightness in the left lateral breast area the skin is markedly different and very firm and taut.  Job: Works in Careers adviser, works from home.  Planning 4 to 6 weeks of for recovery.  PMH Significant for: Diabetes mellitus with most recent A1c 7.3 on 04/08/2020.  It was 10.1 on 02/03/2020.  She also has a history of coronary artery disease, hypertension, hyperlipidemia. She is currently on tamoxifen.  Past Medical  History: Allergies: Allergies  Allergen Reactions  . Penicillins Shortness Of Breath, Rash and Other (See Comments)    Has patient had a PCN reaction causing immediate rash, facial/tongue/throat swelling, SOB or lightheadedness with hypotension: Yes Has patient had a PCN reaction causing severe rash involving mucus membranes or skin necrosis: Yes Has patient had a PCN reaction that required hospitalization No Has patient had a PCN reaction occurring within the last 10 years: Yes If all of the above answers are "NO", then may proceed with Cephalosporin use.   . Clindamycin/Lincomycin Other (See Comments)    Severe stomach cramps  . Ace Inhibitors Other (See Comments) and Cough    REACTION: cough; denies airway involvement     Current Medications:  Current Outpatient Medications:  .  aspirin EC 81 MG tablet, Take 1 tablet (81 mg total) by mouth daily., Disp: 90 tablet, Rfl: 3 .  Blood Glucose Monitoring Suppl (FREESTYLE LITE) DEVI, 1 each by Does not apply route 2 (two) times daily. [name], Disp: 1 each, Rfl: 0 .  calcium carbonate (OSCAL) 1500 (600 Ca) MG TABS tablet, Take 600 mg of elemental calcium by mouth daily with breakfast. , Disp: , Rfl:  .  dapagliflozin propanediol (FARXIGA) 5 MG TABS tablet, TAKE 1 TABLET BY MOUTH DAILY., Disp: 90 tablet, Rfl: 3 .  gabapentin (NEURONTIN) 300 MG capsule, Take 2 capsules (600 mg total) by mouth at bedtime., Disp: 180 capsule, Rfl: 4 .  glucose  blood (FREESTYLE LITE) test strip, 1 each by Other route 2 (two) times daily. [name], Disp: 200 each, Rfl: 0 .  Insulin Glargine-yfgn 100 UNIT/ML SOPN, INJECT 60 UNDER THE SKIN EVERY MORNING, Disp: 45 mL, Rfl: 99 .  Insulin Glargine-yfgn 100 UNIT/ML SOPN, INJECT 60 UNITS INTO THE SKIN EVERY MORNING. AND PEN NEEDLES 1/DAY, Disp: 60 mL, Rfl: 3 .  Insulin Pen Needle (UNIFINE PENTIPS) 31G X 8 MM MISC, use to inject twice a day, Disp: 100 each, Rfl: 3 .  Lancets (FREESTYLE) lancets, 1 each by Other route 2 (two)  times daily. [name], Disp: 200 each, Rfl: 0 .  levothyroxine (SYNTHROID) 137 MCG tablet, TAKE 1 TABLET (137 MCG TOTAL) BY MOUTH DAILY BEFORE BREAKFAST., Disp: 90 tablet, Rfl: 3 .  losartan (COZAAR) 50 MG tablet, TAKE 1 TABLET (50 MG TOTAL) BY MOUTH DAILY., Disp: 90 tablet, Rfl: 2 .  metFORMIN (GLUCOPHAGE-XR) 500 MG 24 hr tablet, TAKE 4 TABLETS BY MOUTH DAILY WITH BREAKFAST, Disp: 360 tablet, Rfl: 3 .  metoprolol tartrate (LOPRESSOR) 50 MG tablet, TAKE 1 TABLET BY MOUTH TWO TIMES DAILY, Disp: 180 tablet, Rfl: 1 .  Multiple Vitamins-Minerals (MULTIVITAMIN WITH MINERALS) tablet, Take 1 tablet by mouth daily. Woman's Gummies, Disp: , Rfl:  .  nitroGLYCERIN (NITROSTAT) 0.4 MG SL tablet, Place 1 tablet (0.4 mg total) under the tongue every 5 (five) minutes as needed for chest pain., Disp: 25 tablet, Rfl: prn .  pantoprazole (PROTONIX) 40 MG tablet, TAKE 1 TABLET BY MOUTH DAILY., Disp: 90 tablet, Rfl: 0 .  pregabalin (LYRICA) 75 MG capsule, TAKE 1 CAPSULE (75 MG TOTAL) BY MOUTH 2 (TWO) TIMES DAILY., Disp: 180 capsule, Rfl: 3 .  rosuvastatin (CRESTOR) 20 MG tablet, TAKE 1 TABLET BY MOUTH DAILY., Disp: 90 tablet, Rfl: 3 .  Semaglutide,0.25 or 0.5MG /DOS, 2 MG/1.5ML SOPN, INJECT 0.5 MG INTO THE SKIN ONCE A WEEK., Disp: 4.5 mL, Rfl: 3 .  Semaglutide,0.25 or 0.5MG /DOS, 2 MG/1.5ML SOPN, INJECT 0.1875 MLS (0.25 MG TOTAL) INTO THE SKIN ONCE A WEEK., Disp: 1.5 mL, Rfl: 11 .  tamoxifen (NOLVADEX) 20 MG tablet, TAKE 1 TABLET BY MOUTH DAILY., Disp: 90 tablet, Rfl: 3 .  traMADol (ULTRAM) 50 MG tablet, TAKE 1 TABLET BY MOUTH EVERY 6 HOURS AS NEEDED FOR PAIN (Patient taking differently: Take by mouth every 6 (six) hours as needed. for pain), Disp: 60 tablet, Rfl: 0  Past Medical Problems: Past Medical History:  Diagnosis Date  . Allergy    seasonal  . BRCA negative 2017  . Breast cancer (HCC) 2014  . Breast cancer (HCC) 2017  . Bronchitis    hx of  . Coronary artery disease 06/2019   80% mid RCA stenosis - medical  management  . Diabetes mellitus, type II (HCC)   . Endometrial hyperplasia 2014   Mirena placed; Dr. Chevis Pretty  . GERD (gastroesophageal reflux disease)   . Headache   . History of radiation therapy 12/01/15-01/11/16   Left breast DIBH / 50.4 Gy in 28 fractions and Left breast boost / 12 Gy in 6 fractions  . Hyperlipidemia   . Hypertension   . Hypothyroidism   . Joint pain   . Obesity   . PCOS (polycystic ovarian syndrome)   . Personal history of radiation therapy 2017  . Pneumonia    as a child  . Sleep apnea 2008   severe OSA-does not use a cpap  . Snoring   . 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 Sharl Ma    Past Surgical History: Past Surgical History:  Procedure Laterality Date  . BREAST BIOPSY  2000   s/p  . BREAST BIOPSY Left 01/22/2013   Procedure: RE-EXCISION LEFT BREAST DCIS;  Surgeon: Shelly Rubenstein, MD;  Location: [facility];  Service: General;  Laterality: Left;  . BREAST LUMPECTOMY Left 01/06/2013   Procedure: LUMPECTOMY;  Surgeon: Shelly Rubenstein, MD;  Location[facility] OR;  Service: General;  Laterality: Left;  . BREAST LUMPECTOMY Left 2017  . BREAST LUMPECTOMY WITH NEEDLE LOCALIZATION AND AXILLARY SENTINEL LYMPH NODE BX Left 09/30/2015   Procedure: Left BREAST LUMPECTOMY WITH NEEDLE LOCALIZATION AND AXILLARY SENTINEL LYMPH NODE BX;  Surgeon: [name], MD;  Location: [facility] OR;  Service: General;  Laterality: Left;  . BREAST RECONSTRUCTION Bilateral 10/12/2015   Procedure: BREAST oncoplastic RECONSTRUCTION;  Surgeon: [name], MD;  Location: [facility];  Service: Plastics;  Laterality: Bilateral;  . BREAST REDUCTION SURGERY Bilateral 10/12/2015   Procedure: MAMMARY REDUCTION  (BREAST);  Surgeon: [name], MD;  Location: [facility];  Service: Plastics;  Laterality: Bilateral;  . DILATION AND CURETTAGE  OF UTERUS  2004   s/p  . EXCISION OF BREAST LESION Left 10/12/2015   Procedure: Re-EXCISION OF BREAST;  Surgeon: [name], MD;  Location: [facility];  Service: General;  Laterality: Left;  . LEFT HEART CATH AND CORONARY ANGIOGRAPHY N/A 06/27/2019   Procedure: LEFT HEART CATH AND CORONARY ANGIOGRAPHY;  Surgeon: Yvonne Kendall, MD;  Location: [facility];  Service: Cardiovascular;  Laterality: N/A;  . REDUCTION MAMMAPLASTY Bilateral 09/2015  . THYROIDECTOMY  02/2009   Dr Gerrit Friends  . TONSILLECTOMY  1982    Social History: Social History   Socioeconomic History  . Marital status: Single    Spouse name: Not on file  . Number of children: Not on file  . Years of education: Not on file  . Highest education level: Not on file  Occupational History    Employer: [employer]    Comment: Outpatient scheduling in radiology  Tobacco Use  . Smoking status: Former Smoker    Packs/day: 0.50    Types: Cigarettes    Quit date: 09/29/2007    Years since quitting: 12.7  . Smokeless tobacco: Never Used  . Tobacco comment: Smoked on and off for 20 years. Quit about 8 years ago (as of 08/2015)  Vaping Use  . Vaping Use: Never used  Substance and Sexual Activity  . Alcohol use: Not Currently    Alcohol/week: 0.0 standard drinks  . Drug use: No  . Sexual activity: Not Currently    Birth control/protection: I.U.D.  Other Topics Concern  . Not on file  Social History Narrative   The patient is divorced, moved to [place] in   2006 from [place].  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    No alcohol.  Tobacco use:  She quit 5 years ago.  She smoked on   and off for approximately 20 years.  No history of recreational drug   Use.Drinks two cups of coffee per work day.    Social Determinants of Health   Financial Resource Strain: Not on file  Food Insecurity: Not on file   Transportation Needs: Not on file  Physical Activity: Not on file  Stress: Not on  file  Social Connections: Not on file  Intimate Partner Violence: Not on file    Family History: Family History  Problem Relation Age of Onset  . Dementia Father        deceased age 54 secondary to dementia  . Bipolar disorder Father   . Emphysema Father   . Heart disease Father   . COPD Father        smoker  . Hypertension Father   . Depression Father   . Alcoholism Father   . CAD Mother 43       Died age 97 of CAD  . Heart disease Mother        CABG x 2  . Graves' disease Mother   . Hypertension Mother   . Thyroid disease Mother   . Heart attack Mother 52  . CAD Maternal Grandfather 60  . Heart disease Maternal Grandfather   . Heart attack Maternal Grandfather 64  . Other Sister 53       hysterectomy for fibroids  . Prostate cancer Brother        low grade; w/ surveillance  . Thyroid nodules Brother 59  . Heart disease Brother 33       CABG x 4  . Heart disease Sister        CABG x 2  . Heart attack Sister 30  . Fibroids Other        niece dx approx 36  . Hypertension Other   . Kidney cancer Cousin        maternal 1st cousin dx 30-47; former smoker  . Lung cancer Other        maternal great aunt (MGM's sister); not a smoker  . Cancer Other        nephew dx neuroblastoma at 39.19 years old  . Colon cancer Neg Hx   . Colon polyps Neg Hx     Review of Systems: Review of Systems  Constitutional: Negative.   Respiratory: Negative.   Cardiovascular: Negative for chest pain, palpitations and leg swelling.  Gastrointestinal: Negative.   Neurological: Negative.     Physical Exam: Vital Signs BP 136/84 (BP Location: Right Arm, Patient Position: Sitting, Cuff Size: Large)   Pulse (!) 101   Ht 5\' 4"  (1.626 m)   Wt 219 lb (99.3 kg)   SpO2 94%   BMI 37.59 kg/m   Physical Exam Constitutional:      General: Not in acute distress.    Appearance: Normal appearance. Not  ill-appearing.  HENT:     Head: Normocephalic and atraumatic.  Eyes:     Pupils: Pupils are equal, round Neck:     Musculoskeletal: Normal range of motion.  Cardiovascular:     Rate and Rhythm: Normal rate    Pulses: Normal pulses.  Pulmonary:     Effort: Pulmonary effort is normal. No respiratory distress.  Abdominal:     General: Abdomen is flat. There is no distension.  Musculoskeletal: Normal range of motion.  Skin:    General: Skin is warm and dry.     Findings: No erythema or rash.  Neurological:     General: No focal deficit present.     Mental Status: Alert and oriented to person, place, and time. Mental status is at baseline.     Motor: No weakness.  Psychiatric:        Mood and Affect: Mood normal.        Behavior: Behavior normal.    Assessment/Plan: The patient  is scheduled for excision of left breast radiation necrosis and left latissimus myocutaneous muscle flap with Dr. Ulice Bold.  Risks, benefits, and alternatives of procedure discussed, questions answered and consent obtained.    Smoking Status: Quit 1-1/2 years ago; Counseling Given?  N/A Last Mammogram: MRI of bilateral breast on 10/01/2019; Results: No mass or abnormal enhancement of the right breast.  Recommend skin biopsy of left breast.  Skin biopsy showed no malignancy.  Caprini Score: 7, high; Risk Factors include: Age, BMI greater than 25, history of cancer, currently on hormone replacement therapy and length of planned surgery. Recommendation for mechanical and pharmacological prophylaxis while hospitalized. Encourage early ambulation.   Pictures obtained:[email or web address]   Post-op Rx sent to pharmacy: Percocet, Zofran, Cipro  Patient was provided with the General Surgical Risk consent document and Pain Medication Agreement prior to their appointment.  They had adequate time to read through the risk consent documents and Pain Medication Agreement. We also discussed them in person together during this preop  appointment. All of their questions were answered to their satisfaction.  Recommended calling if they have any further questions.  Risk consent form and Pain Medication Agreement to be scanned into patient's chart.  We discussed the risks associated with latissimus myocutaneous muscle flap of the left breast.  We discussed the use of postoperative drains.  We discussed risks and complications including but not limited to bleeding, infection, damage to surrounding structures including nerves, blood vessels, muscles.  We discussed postoperative risks of DVT/VTE.  Preoperative clearance sent the patient's cardiology team.  Will need to hold aspirin prior to surgery. Clearance also sent to oncology to determine if patient is able to hold tamoxifen pre and postoperatively.  Recommend holding multivitamin 1 to 2 weeks prior to surgery.  We discussed patient's personal risk factors for postoperative complications including but not limited to history of radiation and diabetes mellitus with A1c of 7.3.   Electronically signed by: Kermit Balo Vannah Nadal, PA-C 06/29/2020 10:44 AM

## 2020-06-29 ENCOUNTER — Encounter: Payer: Self-pay | Admitting: Surgical

## 2020-06-29 ENCOUNTER — Other Ambulatory Visit: Payer: Self-pay

## 2020-06-29 ENCOUNTER — Telehealth: Payer: Self-pay | Admitting: Internal Medicine

## 2020-06-29 ENCOUNTER — Ambulatory Visit (INDEPENDENT_AMBULATORY_CARE_PROVIDER_SITE_OTHER): Payer: No Typology Code available for payment source | Admitting: Surgical

## 2020-06-29 VITALS — BP 136/84 | HR 101 | Ht 64.0 in | Wt 219.0 lb

## 2020-06-29 DIAGNOSIS — Z170 Estrogen receptor positive status [ER+]: Secondary | ICD-10-CM

## 2020-06-29 DIAGNOSIS — C50112 Malignant neoplasm of central portion of left female breast: Secondary | ICD-10-CM

## 2020-06-29 DIAGNOSIS — Z923 Personal history of irradiation: Secondary | ICD-10-CM

## 2020-06-29 DIAGNOSIS — N6489 Other specified disorders of breast: Secondary | ICD-10-CM

## 2020-06-29 MED ORDER — CIPROFLOXACIN HCL 500 MG PO TABS
500.0000 mg | ORAL_TABLET | Freq: Two times a day (BID) | ORAL | 0 refills | Status: DC
  Filled 2020-06-29: qty 6, 3d supply, fill #0

## 2020-06-29 MED ORDER — ONDANSETRON HCL 4 MG PO TABS
4.0000 mg | ORAL_TABLET | Freq: Three times a day (TID) | ORAL | 0 refills | Status: DC | PRN
  Filled 2020-06-29: qty 20, 7d supply, fill #0

## 2020-06-29 MED ORDER — OXYCODONE-ACETAMINOPHEN 5-325 MG PO TABS
1.0000 | ORAL_TABLET | Freq: Four times a day (QID) | ORAL | 0 refills | Status: AC | PRN
  Filled 2020-06-29: qty 20, 5d supply, fill #0

## 2020-06-29 NOTE — Telephone Encounter (Signed)
   [facility] Pre-operative Risk Assessment    Patient Name: Stacey Roberts  DOB: [birth year]  MRN: [id number]   HEARTCARE STAFF: - Please ensure there is not already an duplicate clearance open for this procedure. - Under Visit Info/Reason for Call, type in Other and utilize the format Clearance MM/DD/YY or Clearance TBD. Do not use dashes or single digits. - If request is for dental extraction, please clarify the # of teeth to be extracted. - If the patient is currently at the dentist's office, call Pre-Op APP to address. If the patient is not currently in the dentist office, please route to the Pre-Op pool  Request for surgical clearance:  1. What type of surgery is being performed? Left breast radiation necrosis excision with latissimus muscle flap   2. When is this surgery scheduled? 07/19/20  3. What type of clearance is required (medical clearance vs. Pharmacy clearance to hold med vs. Both)? both  4. Are there any medications that need to be held prior to surgery and how long? Not listed, please advise if needed  5. Practice name and name of physician performing surgery? [facility] - Dr Lyndee Leo Dillingham   6. What is the office phone number? [phone]   7.   What is the office fax number? [phone]  8.   Anesthesia type (None, local, MAC, general) ? General    Caryl Pina Gerringer 06/29/2020, 12:47 PM  _________________________________________________________________   (provider comments below)

## 2020-06-29 NOTE — Telephone Encounter (Signed)
    Stacey Roberts DOB:  [birth year]  MRN:  [id number]   Primary Cardiologist: Nelva Bush, MD  Chart reviewed as part of pre-operative protocol coverage. Given past medical history and time since last visit, based on ACC/AHA guidelines, Stacey Roberts would be at acceptable risk for the planned procedure without further cardiovascular testing.   Personally spoke with the patient today who reports no recent anginal symptoms or equivalents. She can perform greater than 4 METS without difficulty. She was last seen 04/2020 at which time she was also doing quite well.   The patient was advised that if she develops new symptoms prior to surgery to contact our office to arrange for a follow-up visit, and she verbalized understanding.  I will route this recommendation to the requesting party via Epic fax function and remove from pre-op pool.  Please call with questions.  Kathyrn Drown, NP 06/29/2020, 1:08 PM

## 2020-07-01 ENCOUNTER — Other Ambulatory Visit: Payer: Self-pay

## 2020-07-01 ENCOUNTER — Other Ambulatory Visit: Payer: Self-pay | Admitting: Internal Medicine

## 2020-07-01 MED ORDER — ROSUVASTATIN CALCIUM 20 MG PO TABS
ORAL_TABLET | Freq: Every day | ORAL | 3 refills | Status: DC
  Filled 2020-07-01: qty 90, 90d supply, fill #0
  Filled 2020-10-11: qty 90, 90d supply, fill #1
  Filled 2021-01-10: qty 90, 90d supply, fill #2
  Filled 2021-04-15: qty 90, 90d supply, fill #3

## 2020-07-06 ENCOUNTER — Other Ambulatory Visit: Payer: Self-pay

## 2020-07-06 MED FILL — Levothyroxine Sodium Tab 137 MCG: ORAL | 90 days supply | Qty: 90 | Fill #0 | Status: AC

## 2020-07-08 NOTE — Progress Notes (Signed)
Surgical Instructions    Your procedure is scheduled on Monday, July 19, 2020.  Report to [facility] Main Entrance "A" at 05:30 A.M., then check in with the Admitting office.  Call this number if you have problems the morning of surgery:  [phone]   If you have any questions prior to your surgery date call [phone]: Open Monday-Friday 8am-4pm    Remember:  Do not eat or drink after midnight the night before your surgery   Take these medicines the morning of surgery with A SIP OF WATER : Levothyroxine (Synthroid) Metoprolol Tartrate (Lopressor) Pantoprazole (Protonix) Pregabalin (Lyrica) Rosuvastatin (Crestor)   If needed: Nitroglycerin (Nitrostat) Tramadol (Ultram)  As of today, STOP taking any Aspirin (unless otherwise instructed by your surgeon) Aleve, Naproxen, Ibuprofen, Motrin, Advil, Goody's, BC's, all herbal medications, fish oil, and all vitamins.  WHAT DO I DO ABOUT MY DIABETES MEDICATION?   THE DAY BEFORE SURGERY, (Sunday June 26th), DO NOT TAKE Dapagliflozin Propanediol [facility]).  You may take your normal morning dose of Insulin Glargine of 50 units.    THE MORNING OF SURGERY (Monday, June 27th), take a HALF dose of Insulin Glargine which is 25 units.  Do not take oral diabetes medicines (pills) the morning of surgery. DO NOT take Metformin (GLUCAPHAGE-XR) the morning of surgery.  The day of surgery, do not take other diabetes injectables. DO NOT TAKE Dapagliflozin Propanediol (FARXIGA).   If your CBG is greater than 220 mg/dL, you may take  of your sliding scale (correction) dose of insulin.   HOW TO MANAGE YOUR DIABETES BEFORE AND AFTER SURGERY  Why is it important to control my blood sugar before and after surgery? Improving blood sugar levels before and after surgery helps healing and can limit problems. A way of improving blood sugar control is eating a healthy diet by:  Eating less sugar and carbohydrates  Increasing activity/exercise   Talking with your doctor about reaching your blood sugar goals High blood sugars (greater than 180 mg/dL) can raise your risk of infections and slow your recovery, so you will need to focus on controlling your diabetes during the weeks before surgery. Make sure that the doctor who takes care of your diabetes knows about your planned surgery including the date and location.  How do I manage my blood sugar before surgery? Check your blood sugar at least 4 times a day, starting 2 days before surgery, to make sure that the level is not too high or low.  Check your blood sugar the morning of your surgery when you wake up and every 2 hours until you get to the Short Stay unit.  If your blood sugar is less than 70 mg/dL, you will need to treat for low blood sugar: Do not take insulin. Treat a low blood sugar (less than 70 mg/dL) with  cup of clear juice (cranberry or apple), 4 glucose tablets, OR glucose gel. Recheck blood sugar in 15 minutes after treatment (to make sure it is greater than 70 mg/dL). If your blood sugar is not greater than 70 mg/dL on recheck, call [phone] for further instructions. Report your blood sugar to the short stay nurse when you get to Short Stay.  If you are admitted to the hospital after surgery: Your blood sugar will be checked by the staff and you will probably be given insulin after surgery (instead of oral diabetes medicines) to make sure you have good blood sugar levels. The goal for blood sugar control after surgery is 80-180 mg/dL.  Do not wear jewelry or makeup. Do not wear lotions, powders, perfumes or deodorant. Do not shave 48 hours prior to surgery.   Do not bring valuables to the hospital. Do not wear nail polish, gel polish, artificial nails, or any other type of covering on natural nails including finger and toenails. If patients have artificial nails, gel coating, etc. that need to be removed by a nail salon, please have this  removed prior to surgery.  Surgery may need to be canceled/delayed if the surgeon/ anesthesia feels like the patient is unable to be adequately monitored due to artificial nails, gel coating, etc.   [facility] is not responsible for any belongings or valuables.  Do NOT Smoke (Tobacco/Vaping) or drink Alcohol 24 hours prior to your procedure If you use a CPAP at night, you may bring all equipment for your overnight stay.   Contacts, glasses, dentures or bridgework may not be worn into surgery, please bring cases for these belongings   For patients admitted to the hospital, discharge time will be determined by your treatment team.   Patients discharged the day of surgery will not be allowed to drive home, and someone needs to stay with them for 24 hours.    Special instructions:   [facility]- Preparing For Surgery  Before surgery, you can play an important role. Because skin is not sterile, your skin needs to be as free of germs as possible. You can reduce the number of germs on your skin by washing with CHG (chlorahexidine gluconate) Soap before surgery.  CHG is an antiseptic cleaner which kills germs and bonds with the skin to continue killing germs even after washing.    Oral Hygiene is also important to reduce your risk of infection.  Remember - BRUSH YOUR TEETH THE MORNING OF SURGERY WITH YOUR REGULAR TOOTHPASTE  Please do not use if you have an allergy to CHG or antibacterial soaps. If your skin becomes reddened/irritated stop using the CHG.  Do not shave (including legs and underarms) for at least 48 hours prior to first CHG shower. It is OK to shave your face.  Please follow these instructions carefully.   Shower the NIGHT BEFORE SURGERY and the MORNING OF SURGERY  If you chose to wash your hair, wash your hair first as usual with your normal shampoo.  After you shampoo, rinse your hair and body thoroughly to remove the shampoo.  Wash Face and genitals (private parts) with  your normal soap.   Use CHG Soap as you would any other liquid soap. You can apply CHG directly to the skin and wash gently with a scrungie or a clean washcloth.   Apply the CHG Soap to your body ONLY FROM THE NECK DOWN.  Do not use on open wounds or open sores. Avoid contact with your eyes, ears, mouth and genitals (private parts).   Wash thoroughly, paying special attention to the area where your surgery will be performed.  Thoroughly rinse your body with warm water from the neck down.  DO NOT shower/wash with your normal soap after using and rinsing off the CHG Soap.  Pat yourself dry with a CLEAN TOWEL.  Wear CLEAN PAJAMAS to bed the night before surgery  Place CLEAN SHEETS on your bed the night before your surgery  DO NOT SLEEP WITH PETS.   Day of Surgery: Take a shower with CHG soap as directed above. Wear Clean/Comfortable clothing the morning of surgery Do not apply any deodorants/lotions.   Remember to  brush your teeth WITH YOUR REGULAR TOOTHPASTE.   Please read over the following fact sheets that you were given.

## 2020-07-09 ENCOUNTER — Encounter (HOSPITAL_COMMUNITY)
Admission: RE | Admit: 2020-07-09 | Discharge: 2020-07-09 | Disposition: A | Discharge status: Home / Self Care | Payer: No Typology Code available for payment source | Source: Ambulatory Visit | Attending: Plastic Surgery | Admitting: Plastic Surgery

## 2020-07-09 ENCOUNTER — Other Ambulatory Visit: Payer: Self-pay

## 2020-07-09 ENCOUNTER — Telehealth: Payer: Self-pay | Admitting: Plastic Surgery

## 2020-07-09 ENCOUNTER — Encounter (HOSPITAL_COMMUNITY): Payer: Self-pay

## 2020-07-09 DIAGNOSIS — Z01812 Encounter for preprocedural laboratory examination: Secondary | ICD-10-CM | POA: Insufficient documentation

## 2020-07-09 LAB — CBC
HCT: 41.1 % (ref 36.0–46.0)
Hemoglobin: 13.2 g/dL (ref 12.0–15.0)
MCH: 28.2 pg (ref 26.0–34.0)
MCHC: 32.1 g/dL (ref 30.0–36.0)
MCV: 87.8 fL (ref 80.0–100.0)
Platelets: 256 10*3/uL (ref 150–400)
RBC: 4.68 MIL/uL (ref 3.87–5.11)
RDW: 14 % (ref 11.5–15.5)
WBC: 10.1 10*3/uL (ref 4.0–10.5)
nRBC: 0 % (ref 0.0–0.2)

## 2020-07-09 LAB — BASIC METABOLIC PANEL
Anion gap: 10 (ref 5–15)
BUN: 16 mg/dL (ref 6–20)
CO2: 26 mmol/L (ref 22–32)
Calcium: 9 mg/dL (ref 8.9–10.3)
Chloride: 101 mmol/L (ref 98–111)
Creatinine, Ser: 0.66 mg/dL (ref 0.44–1.00)
GFR, Estimated: >60 mL/min (ref >60)
Glucose, Bld: 252 mg/dL — ABNORMAL HIGH (ref 70–99)
Potassium: 4.1 mmol/L (ref 3.5–5.1)
Sodium: 137 mmol/L (ref 135–145)

## 2020-07-09 LAB — GLUCOSE, CAPILLARY: Glucose-Capillary: 226 mg/dL — ABNORMAL HIGH (ref 70–99)

## 2020-07-09 MED FILL — Semaglutide Soln Pen-inj 0.25 or 0.5 MG/DOSE (2 MG/1.5ML): SUBCUTANEOUS | 84 days supply | Qty: 4.5 | Fill #0 | Status: AC

## 2020-07-09 NOTE — Telephone Encounter (Signed)
Left message on voicemail for Dr. Virgie Dad nurse requesting clarification on whether Tamoxifen can/should be held prior to surgery. We received surgery clearance from Dr. Jana Hakim on 07/07/2020. Surgery is scheduled for 6/27.

## 2020-07-09 NOTE — Progress Notes (Signed)
PCP - [name], NP Cardiologist - Dr. Harrell Gave End with [facility] [place]  Chest x-ray - Not indicated EKG - 04/28/20 Stress Test - Long time ago no f/u needed at that time ECHO - 05/29/19 Cardiac Cath -06/27/19   Sleep Study - Yes no OSA  DM - Type II CBG at PAT appt 226 no insulin this am  Fasting Blood Sugar - 120-160 Checks Blood Sugar _1-2 x day  Aspirin Instructions: Patient to call Dr. Marla Roe for instruction  COVID TEST- 07/15/20  Anesthesia review: Yes cardiac history  Patient denies shortness of breath, fever, cough and chest pain at PAT appointment   All instructions explained to the patient, with a verbal understanding of the material. Patient agrees to go over the instructions while at home for a better understanding.  The opportunity to ask questions was provided.

## 2020-07-10 LAB — HEMOGLOBIN A1C
Hgb A1c MFr Bld: 7.7 % — ABNORMAL HIGH (ref 4.8–5.6)
Mean Plasma Glucose: 174 mg/dL

## 2020-07-11 IMAGING — US US BREAST*L* LIMITED INC AXILLA
1 series · 13 of 13 positions shown · non-contrast
Comparison: 08/15/2018 and earlier

CLINICAL DATA: History of LEFT lumpectomy in 2957 in 9295. Patient
was treated with radiation therapy in 9295. Patient presents with
inflamed, tender, red LEFT lumpectomy site. She reports increased
puckering of the lumpectomy scar in the UPPER-OUTER QUADRANT of the
LEFT breast as well. Symptoms started approximately 1 month ago.

EXAM:
DIGITAL DIAGNOSTIC LEFT MAMMOGRAM WITH CAD AND TOMO
ULTRASOUND LEFT BREAST

[Series 1: us breast*left* limited inc axilla · 0.07mm/px · 13 of 13 slices shown]
[im 1/13]
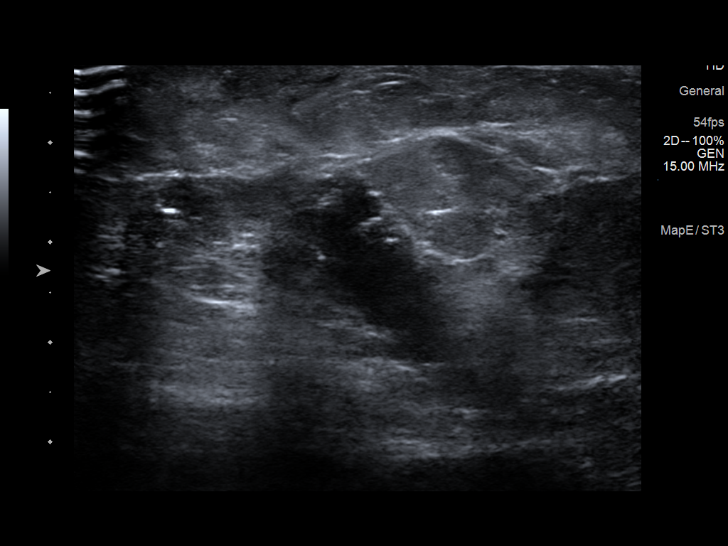
[im 2/13]
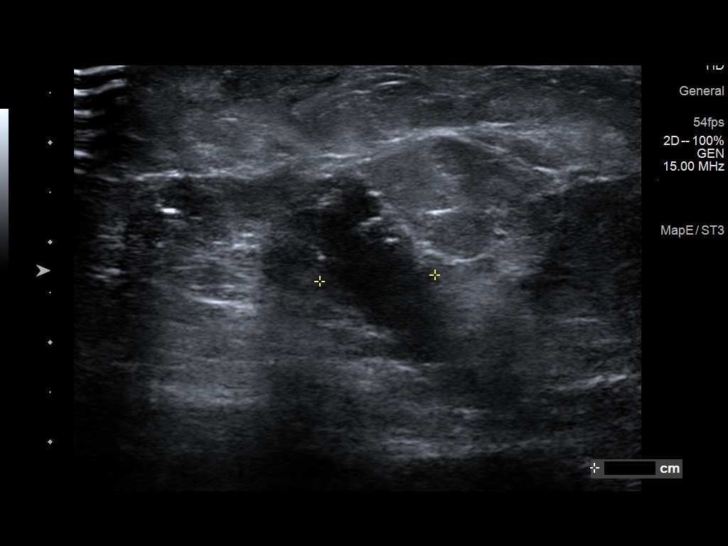
[im 3/13]
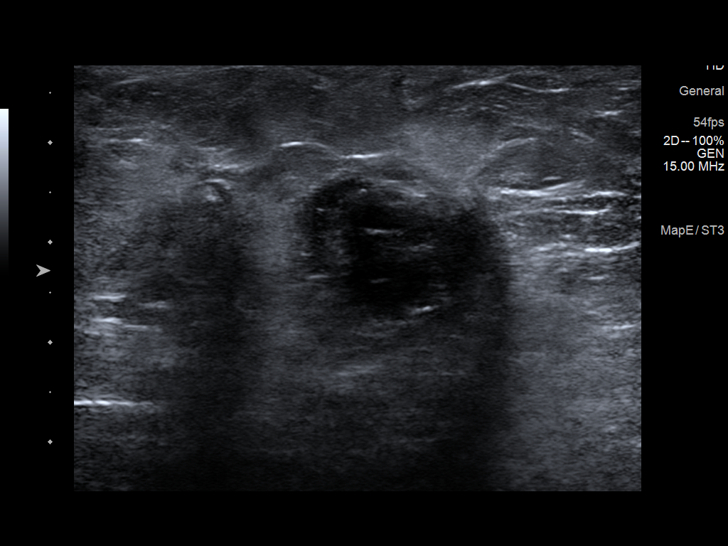
[im 4/13]
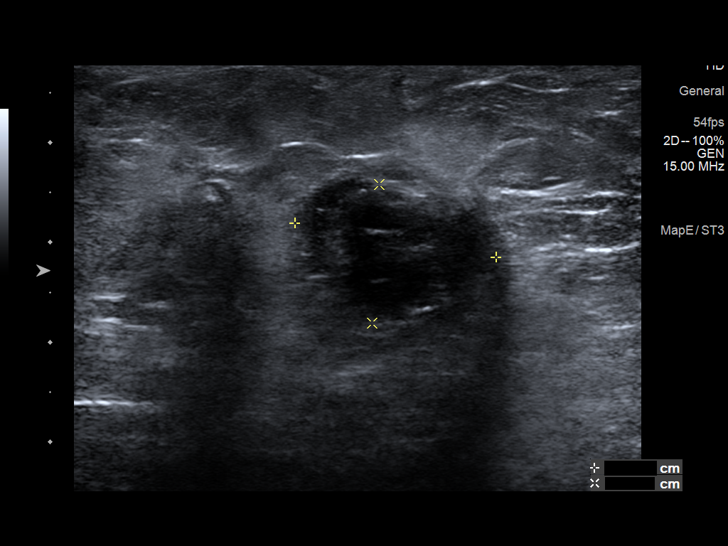
[im 5/13]
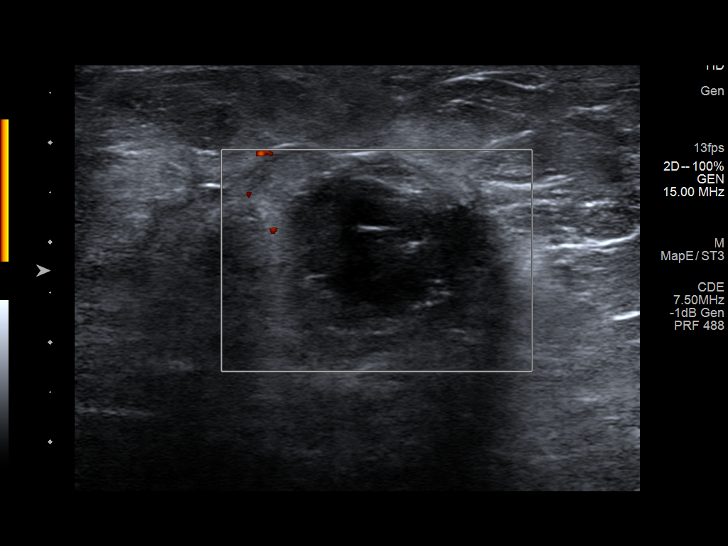
[im 6/13]
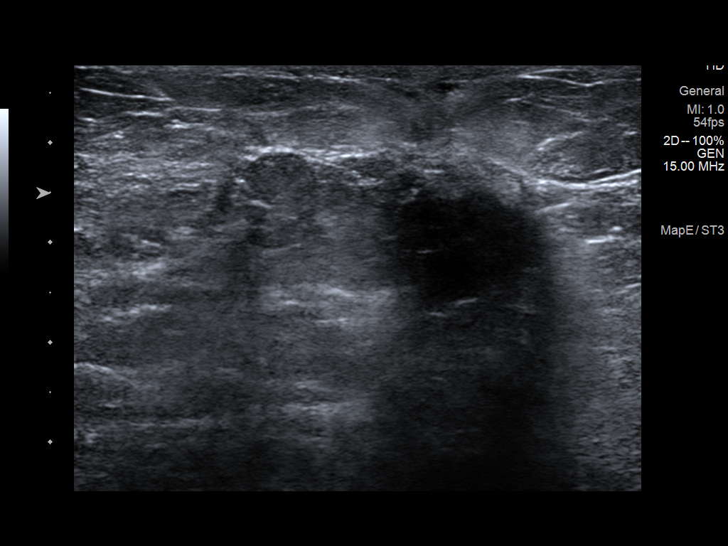
[im 7/13]
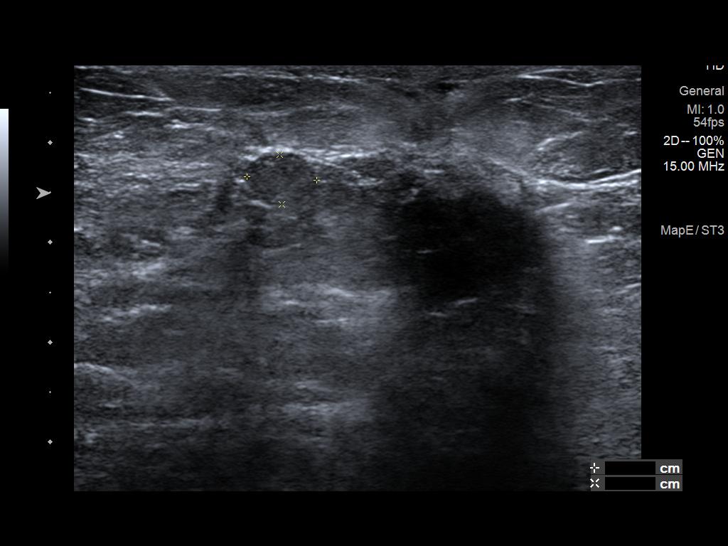
[im 8/13]
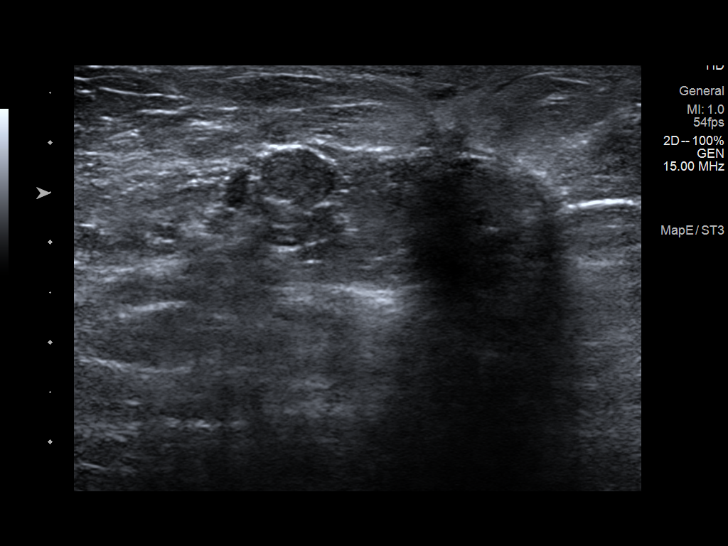
[im 9/13]
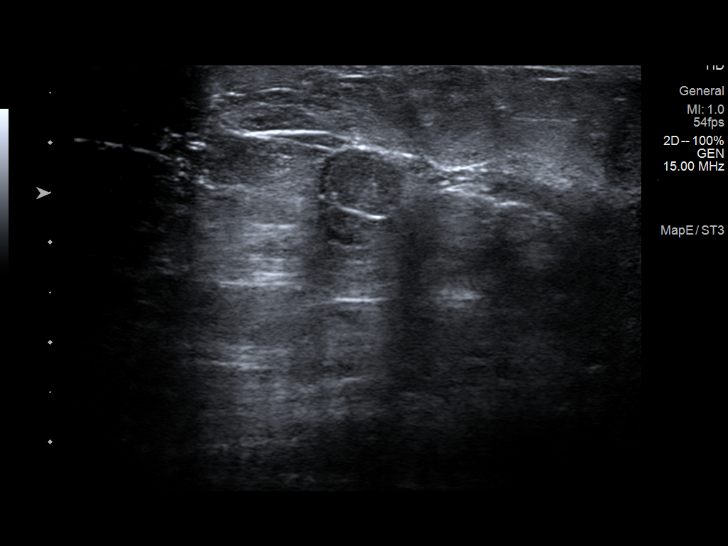
[im 10/13]
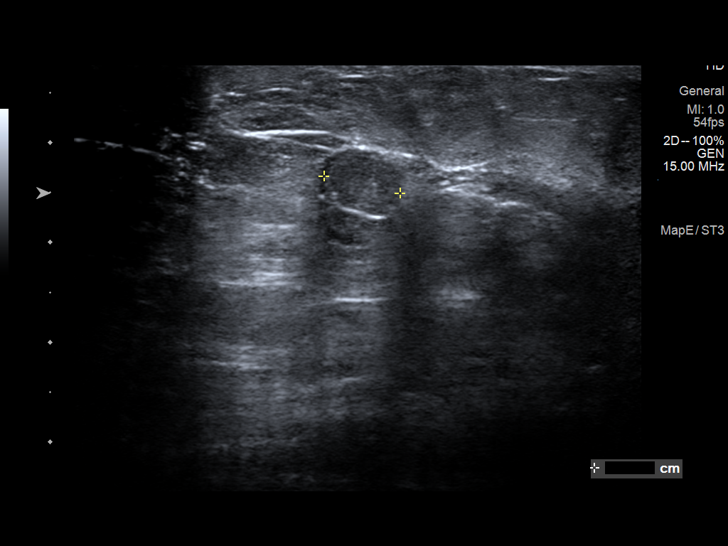
[im 11/13]
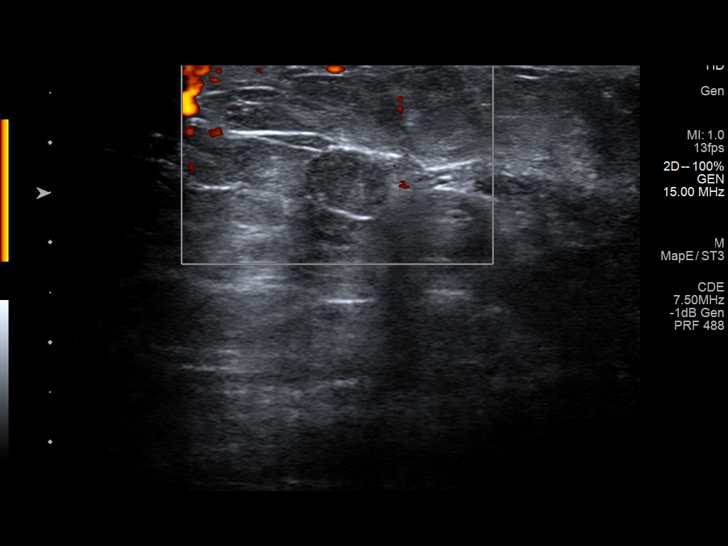
[im 12/13]
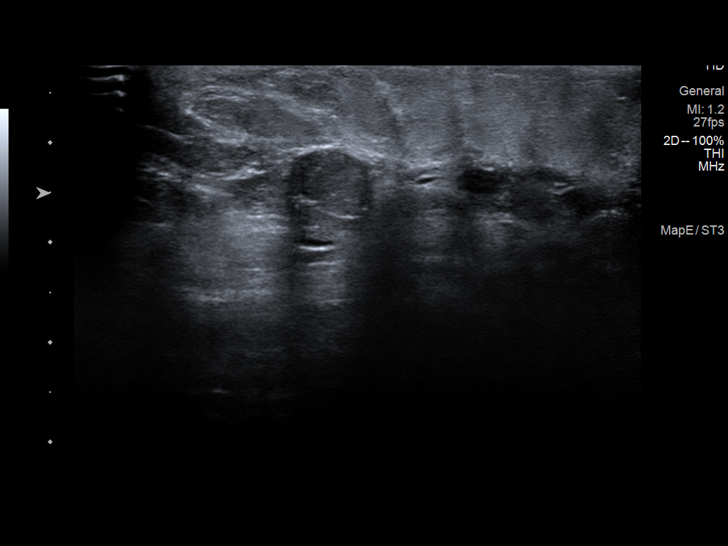
[im 13/13]
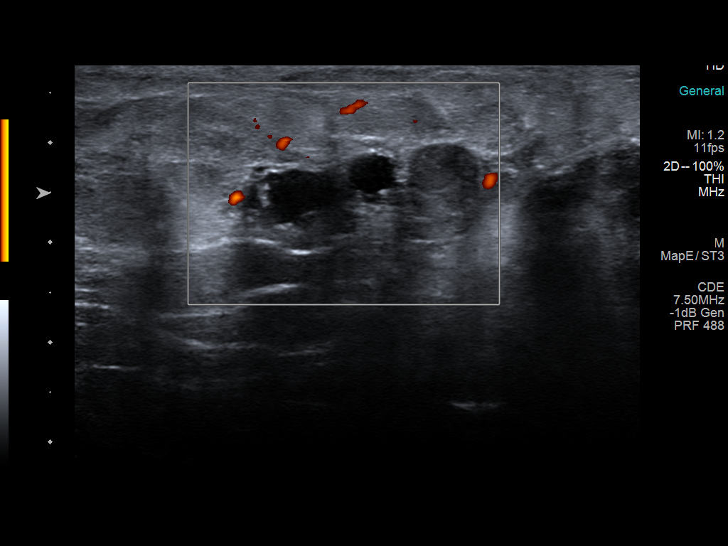

[13 of 13 positions shown; findings below may reference images not displayed]

ACR Breast Density Category b: There are scattered areas of
fibroglandular density.
FINDINGS: Surgical changes are identified in UPPER and LOWER OUTER quadrants
of the LEFT breast. There are extensive postoperative changes,
consisting of fat necrosis, dystrophic type calcifications, and
surgical clips. Along the LOWER INNER portion of the postsurgical
changes in the UPPER-OUTER QUADRANT, there are faint calcifications
warranting further evaluation with magnified views. However, patient
is unable to tolerate additional magnified view is at this time
secondary to significant breast tenderness.

Mammographic images were processed with CAD.

On physical exam, the scar in the UPPER-OUTER QUADRANT of the LEFT
breast is slightly tender on exam. The scar appears slightly
puckered centrally. There is no focal skin thickening. There is
significant diffuse firmness of the LEFT breast, which the patient
reports is stable since radiation treatment. I palpate no discrete
mass.

Targeted ultrasound is performed, showing heterogeneous hypoechoic,
hyperechoic, and cystic changes throughout the LATERAL portion of
the LEFT breast, consistent with previous lumpectomy and radiation
treatment. No discrete suspicious mass identified sonographically.
There is no fluid collection.
IMPRESSION: Tenderness, redness, and change in the appearance of the scar for
last month. The findings are not typical for mastitis. There is no
evidence for abscess. However, a short course of antibiotics
recommended to exclude mastitis.

Changes in the LEFT breast identified on ultrasound are most
characteristic of fat necrosis. No discrete solid lesion identified.

Calcifications along LOWER INNER portion of postoperative changes or
further evaluation with magnified views, deferred until next exam.

RECOMMENDATION:
1. 7 day course of doxycycline.
2. Follow-up bilateral diagnostic mammogram and possible LEFT breast
ultrasound 08/18/2019, scheduled for the patient.
3. Recommend bilateral breast MRI with and without contrast
following diagnostic mammogram to exclude recurrent malignancy LEFT
breast.

I have discussed the findings and recommendations with the patient.
If applicable, a reminder letter will be sent to the patient
regarding the next appointment.

BI-RADS CATEGORY  0: Incomplete. Need additional imaging evaluation
and/or prior mammograms for comparison.

## 2020-07-12 ENCOUNTER — Other Ambulatory Visit: Payer: Self-pay

## 2020-07-12 ENCOUNTER — Other Ambulatory Visit: Payer: Self-pay | Admitting: Internal Medicine

## 2020-07-12 NOTE — Anesthesia Preprocedure Evaluation (Addendum)
Anesthesia Evaluation  Patient identified by MRN, date of birth, ID band Patient awake    Reviewed: Allergy & Precautions, NPO status , Patient's Chart, lab work & pertinent test results  Airway Mallampati: II  TM Distance: >3 FB Neck ROM: Full    Dental no notable dental hx.    Pulmonary sleep apnea , former smoker,    Pulmonary exam normal breath sounds clear to auscultation       Cardiovascular hypertension, + CAD  Normal cardiovascular exam Rhythm:Regular Rate:Normal     Neuro/Psych negative neurological ROS  negative psych ROS   GI/Hepatic negative GI ROS, Neg liver ROS,   Endo/Other  diabetesHypothyroidism   Renal/GU negative Renal ROS  negative genitourinary   Musculoskeletal negative musculoskeletal ROS (+)   Abdominal   Peds negative pediatric ROS (+)  Hematology negative hematology ROS (+)   Anesthesia Other Findings   Reproductive/Obstetrics negative OB ROS                            Anesthesia Physical Anesthesia Plan  ASA: 3  Anesthesia Plan: General   Post-op Pain Management:    Induction: Intravenous  PONV Risk Score and Plan: 3 and Ondansetron, Dexamethasone, Midazolam and Treatment may vary due to age or medical condition  Airway Management Planned: Oral ETT  Additional Equipment:   Intra-op Plan:   Post-operative Plan: Extubation in OR  Informed Consent: I have reviewed the patients History and Physical, chart, labs and discussed the procedure including the risks, benefits and alternatives for the proposed anesthesia with the patient or authorized representative who has indicated his/her understanding and acceptance.     Dental advisory given  Plan Discussed with: CRNA and Surgeon  Anesthesia Plan Comments: (PAT note written 07/12/2020 by Myra Gianotti, PA-C. )       Anesthesia Quick Evaluation

## 2020-07-12 NOTE — Telephone Encounter (Signed)
Refill request Pantoprazole Last refill 03/26/20 #90 Last office visit 02/03/20 No upcoming appointment scheduled

## 2020-07-12 NOTE — Progress Notes (Signed)
Anesthesia Chart Review:  Case: [id number] Date/Time: 07/19/20 0715   Procedure: Left breast radiation necrosis excision with latissimus muscle flap (Left: Breast) - 3 hours   Anesthesia type: General   Pre-op diagnosis: Postoperative Breast Asymmetry, Status post radiation therapy   Location: [facility] OR ROOM 08 / [place] [facility]   Surgeons: Wallace Going, DO       DISCUSSION: Patient is a 55 year old female scheduled for the above procedure.  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 BMI consistent with obesity.  She is a Adult nurse. By notes, she is team lead for centralized scheduling.  Preoperative cardiology input outlined by Kathyrn Drown, NP on 06/29/20, "Given past medical history and time since last visit, based on ACC/AHA guidelines, LIVIE VANDERHOOF would be at acceptable risk for the planned procedure without further cardiovascular testing.   Personally spoke with the patient today who reports no recent anginal symptoms or equivalents. She can perform greater than 4 METS without difficulty. She was last seen 04/2020 at which time she was also doing quite well.    The patient was advised that if she develops new symptoms prior to surgery to contact our office to arrange for a follow-up visit, and she verbalized understanding."    She is to hold ASA per plastic surgeon. They are clarifying with Dr. Jana Hakim if she  should hold tamoxifen. Anesthesia team to evaluate on the day of surgery.   VS: BP (!) 144/77   Pulse 93   Temp 36.7 C (Oral)   Resp 19   Ht _0  (1.626 m)   Wt 102.5 kg   SpO2 98%   BMI 38.79 kg/m    PROVIDERS: Jearld Fenton, NP is PCP Lurline Del, MD is Olivia Canter, MD is RAD-ONC Nelva Bush, MD is cardiologist. Last visit 04/28/20 with Laurann Montana, NP. Renato Shin, MD is endocrinologist   LABS: Labs reviewed: Acceptable for surgery. (all labs ordered are listed, but only abnormal results are displayed)  Labs Reviewed  GLUCOSE, CAPILLARY - Abnormal; Notable for the following components:      Result Value   Glucose-Capillary 226 (*)    All other components within normal limits  HEMOGLOBIN A1C - Abnormal; Notable for the following components:   Hgb A1c MFr Bld 7.7 (*)    All other components within normal limits  BASIC METABOLIC PANEL - Abnormal; Notable for the following components:   Glucose, Bld 252 (*)    All other components within normal limits  CBC    Sleep Study 08/21/06:  Impression: Severe obstructive sleep apnea with an apnea/Hypopnea index of 98 events per hour and O2 saturation as well as low as 61%.  Treatment of this degree of sleep apnea should focus primarily on weight loss, impalpable, as well as CPAP.   EKG: 04/28/20: NSR   CV: Cardiac cath 06/27/19: Conclusions: Significant single-vessel coronary artery disease with calcified stenosis of up to 80% involving mid portion of small RCA.  There is mild, non-obstructive disease involving the LAD and LCx. Mildly  elevated left ventricular filling pressure.  Recommendations: Optimize medical therapy, including increasing metoprolol tartrate to 50 mg BID. Continue aggressive lipid and blood sugar control to prevent progression of disease. If the patient has refractory symptoms despite maximal tolerated doses of at least 2 antianginal agents, PCI to the RCA could be  considered.   Coronary CTA and FFR 06/12/19: IMPRESSION: 1.  CT FFR indicates significant stenosis in the LCX, OM1, and RCA. 2.  LAD FFRct indeterminate. IMPRESSION: 1. Coronary calcium score of 1373. This was 99th percentile for age and sex matched control. 2. Normal coronary origin with right dominance. 3. Coronary arteries are diffusely diseased. There is severe (>70%) plaque in the RCA and OM1. CAD RADS 4. 4.  Will send study for FFRct. FFR: 1. Left Main:  No significant stenosis.  FFRct 0.99. 2. LAD: No significant stenosis. FFRct 0.96 proximal, 0.83 mid, 0.73 distal. 3. LCX: Significant stenosis. FFRct 0.94 proximal, 0.64 distal (abnormal). OM1 0.66 (abnormal). 4. RCA: Significant stenosis. FFRct 0.99 proximal, 0.72 mid, 0.59 distal.    Echo 05/29/19: IMPRESSIONS   1. Left ventricular ejection fraction, by estimation, is 60 to 65%. The  left ventricle has normal function. The left ventricle has no regional  wall motion abnormalities. Left ventricular diastolic parameters were  normal.   2. Right ventricular systolic function is normal. The right ventricular  size is normal.   3. The mitral valve is grossly normal. No evidence of mitral valve  regurgitation.   4. The aortic valve is tricuspid. Aortic valve regurgitation is trivial.  No aortic stenosis is present.   5. The inferior vena cava is normal in size with greater than 50%  respiratory variability, suggesting right atrial pressure of 3 mmHg.    Echo on 11/28/12 showed: - Left ventricle: The cavity size was normal. Wall thickness was increased in a pattern of mild LVH. The estimated ejection fraction was 60%. Wall motion was normal; there were no regional wall motion abnormalities. - Right ventricle: The cavity size was normal. Systolic function was normal.    Past Medical History:  Diagnosis Date   Allergy    seasonal   BRCA negative 2017   Breast cancer ([place]) 2014   Breast cancer ([place]) 2017   Bronchitis     hx of   Coronary artery disease 06/2019   80% mid RCA stenosis - medical management   Diabetes mellitus, type II ([place])    Endometrial hyperplasia 2014   Mirena placed; Dr. 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 Buddy Duty    Past Surgical History:  Procedure Laterality Date   BREAST BIOPSY  2000   s/p   BREAST BIOPSY Left 01/22/2013   Procedure: RE-EXCISION LEFT BREAST DCIS;  Surgeon: Harl Bowie, MD;  Location: [place];  Service: General;  Laterality: Left;   BREAST LUMPECTOMY Left 01/06/2013   Procedure: LUMPECTOMY;  Surgeon: Harl Bowie, MD;  Location: [place];  Service: General;  Laterality: Left;   BREAST LUMPECTOMY Left 2017   BREAST LUMPECTOMY WITH NEEDLE LOCALIZATION AND AXILLARY SENTINEL LYMPH NODE BX Left 09/30/2015   Procedure: Left BREAST LUMPECTOMY WITH NEEDLE LOCALIZATION AND AXILLARY SENTINEL LYMPH NODE BX;  Surgeon: Coralie Keens, MD;  Location: [place];  Service: General;  Laterality: Left;   BREAST RECONSTRUCTION Bilateral 10/12/2015   Procedure: BREAST oncoplastic RECONSTRUCTION;  Surgeon: Irene Limbo, MD;  Location: [place];  Service: Plastics;  Laterality: Bilateral;   BREAST REDUCTION SURGERY Bilateral 10/12/2015   Procedure: MAMMARY REDUCTION  (BREAST);  Surgeon: Irene Limbo, MD;  Location: [place];  Service: Plastics;  Laterality:  Bilateral;   CORONARY ANGIOPLASTY     DILATION AND CURETTAGE OF UTERUS  2004   s/p   EXCISION OF BREAST LESION Left 10/12/2015   Procedure: Re-EXCISION OF BREAST;  Surgeon: Coralie Keens, MD;  Location: [place];  Service: General;  Laterality: Left;   LEFT HEART CATH AND CORONARY ANGIOGRAPHY N/A 06/27/2019   Procedure: LEFT HEART CATH AND CORONARY ANGIOGRAPHY;  Surgeon: Nelva Bush, MD;  Location: [place] [facility];  Service: Cardiovascular;  Laterality: N/A;   REDUCTION MAMMAPLASTY Bilateral 09/2015   THYROIDECTOMY  02/2009   Dr Harlow Asa   TONSILLECTOMY  1982   TONSILLECTOMY      MEDICATIONS:  aspirin EC 81 MG tablet   Blood Glucose Monitoring Suppl (FREESTYLE LITE) DEVI   ciprofloxacin (CIPRO) 500 MG tablet   dapagliflozin propanediol (FARXIGA) 5 MG TABS tablet   gabapentin (NEURONTIN) 300 MG capsule   glucose blood (FREESTYLE LITE) test strip   Insulin Glargine-yfgn 100 UNIT/ML SOPN   Insulin Glargine-yfgn 100 UNIT/ML SOPN   Insulin Pen Needle (UNIFINE PENTIPS) 31G X 8 MM MISC   Lancets (FREESTYLE) lancets   levothyroxine (SYNTHROID) 137 MCG tablet   losartan (COZAAR) 50 MG tablet   metFORMIN (GLUCOPHAGE-XR) 500 MG 24 hr tablet   metoprolol tartrate (LOPRESSOR) 50 MG tablet   Multiple Vitamins-Minerals (MULTIVITAMIN WITH MINERALS) tablet   nitroGLYCERIN (NITROSTAT) 0.4 MG SL tablet   ondansetron (ZOFRAN) 4 MG tablet   pantoprazole (PROTONIX) 40 MG tablet   pregabalin (LYRICA) 75 MG capsule   rosuvastatin (CRESTOR) 20 MG tablet   Semaglutide,0.25 or 0.5MG/DOS, 2 MG/1.5ML SOPN   Semaglutide,0.25 or 0.5MG/DOS, 2 MG/1.5ML SOPN   tamoxifen (NOLVADEX) 20 MG tablet   traMADol (ULTRAM) 50 MG tablet   No current facility-administered medications for this encounter.    Myra Gianotti, PA-C Surgical Short Stay/Anesthesiology [facility] [place] Phone [phone] [facility] Phone [phone] 07/12/2020 12:30 PM

## 2020-07-13 ENCOUNTER — Other Ambulatory Visit: Payer: Self-pay

## 2020-07-13 MED ORDER — PANTOPRAZOLE SODIUM 40 MG PO TBEC
DELAYED_RELEASE_TABLET | Freq: Every day | ORAL | 0 refills | Status: DC
  Filled 2020-07-13: qty 90, 90d supply, fill #0

## 2020-07-14 NOTE — Progress Notes (Signed)
Chart correction request per patient from 07/09/2020 PAT visit. Weight should be 219 lb and not 226 lb. Weight entered as corrected stated weight of 219 lb.   Myra Gianotti, PA-C Surgical Short Stay/Anesthesiology [facility] Phone [phone] [facility] Phone [phone] 07/14/2020 11:22 AM

## 2020-07-15 ENCOUNTER — Other Ambulatory Visit
Admission: RE | Admit: 2020-07-15 | Discharge: 2020-07-15 | Disposition: A | Discharge status: Home / Self Care | Payer: No Typology Code available for payment source | Source: Ambulatory Visit | Attending: Plastic Surgery | Admitting: Plastic Surgery

## 2020-07-15 ENCOUNTER — Other Ambulatory Visit: Payer: Self-pay

## 2020-07-15 DIAGNOSIS — Z01812 Encounter for preprocedural laboratory examination: Secondary | ICD-10-CM | POA: Diagnosis not present

## 2020-07-15 DIAGNOSIS — Z20822 Contact with and (suspected) exposure to covid-19: Secondary | ICD-10-CM | POA: Insufficient documentation

## 2020-07-15 LAB — SARS CORONAVIRUS 2 (TAT 6-24 HRS): SARS Coronavirus 2: NEGATIVE

## 2020-07-19 ENCOUNTER — Inpatient Hospital Stay (HOSPITAL_COMMUNITY)
Admission: RE | Admit: 2020-07-19 | Discharge: 2020-07-20 | DRG: 908 | Disposition: A | Discharge status: Home / Self Care | Payer: No Typology Code available for payment source | Attending: Plastic Surgery | Admitting: Plastic Surgery

## 2020-07-19 ENCOUNTER — Encounter (HOSPITAL_COMMUNITY)
Admission: RE | Disposition: A | Discharge status: Home / Self Care | Payer: Self-pay | Source: Home / Self Care | Attending: Plastic Surgery

## 2020-07-19 ENCOUNTER — Encounter (HOSPITAL_COMMUNITY): Payer: Self-pay | Admitting: Plastic Surgery

## 2020-07-19 ENCOUNTER — Inpatient Hospital Stay (HOSPITAL_COMMUNITY): Payer: No Typology Code available for payment source | Admitting: Certified Registered"

## 2020-07-19 ENCOUNTER — Ambulatory Visit: Payer: No Typology Code available for payment source | Admitting: Oncology

## 2020-07-19 ENCOUNTER — Other Ambulatory Visit: Payer: No Typology Code available for payment source

## 2020-07-19 ENCOUNTER — Other Ambulatory Visit: Payer: Self-pay

## 2020-07-19 ENCOUNTER — Inpatient Hospital Stay (HOSPITAL_COMMUNITY): Payer: No Typology Code available for payment source | Admitting: Vascular Surgery

## 2020-07-19 DIAGNOSIS — Z8042 Family history of malignant neoplasm of prostate: Secondary | ICD-10-CM

## 2020-07-19 DIAGNOSIS — Y842 Radiological procedure and radiotherapy as the cause of abnormal reaction of the patient, or of later complication, without mention of misadventure at the time of the procedure: Secondary | ICD-10-CM | POA: Diagnosis present

## 2020-07-19 DIAGNOSIS — Z7982 Long term (current) use of aspirin: Secondary | ICD-10-CM

## 2020-07-19 DIAGNOSIS — Z923 Personal history of irradiation: Secondary | ICD-10-CM

## 2020-07-19 DIAGNOSIS — Y781 Therapeutic (nonsurgical) and rehabilitative radiological devices associated with adverse incidents: Secondary | ICD-10-CM | POA: Diagnosis present

## 2020-07-19 DIAGNOSIS — Z8249 Family history of ischemic heart disease and other diseases of the circulatory system: Secondary | ICD-10-CM

## 2020-07-19 DIAGNOSIS — Z880 Allergy status to penicillin: Secondary | ICD-10-CM

## 2020-07-19 DIAGNOSIS — Z794 Long term (current) use of insulin: Secondary | ICD-10-CM

## 2020-07-19 DIAGNOSIS — L7682 Other postprocedural complications of skin and subcutaneous tissue: Principal | ICD-10-CM | POA: Diagnosis present

## 2020-07-19 DIAGNOSIS — I96 Gangrene, not elsewhere classified: Secondary | ICD-10-CM | POA: Diagnosis present

## 2020-07-19 DIAGNOSIS — Z881 Allergy status to other antibiotic agents status: Secondary | ICD-10-CM

## 2020-07-19 DIAGNOSIS — Z85850 Personal history of malignant neoplasm of thyroid: Secondary | ICD-10-CM

## 2020-07-19 DIAGNOSIS — Z79810 Long term (current) use of selective estrogen receptor modulators (SERMs): Secondary | ICD-10-CM

## 2020-07-19 DIAGNOSIS — G4733 Obstructive sleep apnea (adult) (pediatric): Secondary | ICD-10-CM | POA: Diagnosis present

## 2020-07-19 DIAGNOSIS — Z888 Allergy status to other drugs, medicaments and biological substances status: Secondary | ICD-10-CM

## 2020-07-19 DIAGNOSIS — K219 Gastro-esophageal reflux disease without esophagitis: Secondary | ICD-10-CM | POA: Diagnosis present

## 2020-07-19 DIAGNOSIS — Z7984 Long term (current) use of oral hypoglycemic drugs: Secondary | ICD-10-CM

## 2020-07-19 DIAGNOSIS — N650 Deformity of reconstructed breast: Secondary | ICD-10-CM

## 2020-07-19 DIAGNOSIS — Z853 Personal history of malignant neoplasm of breast: Secondary | ICD-10-CM

## 2020-07-19 DIAGNOSIS — E890 Postprocedural hypothyroidism: Secondary | ICD-10-CM | POA: Diagnosis present

## 2020-07-19 DIAGNOSIS — N651 Disproportion of reconstructed breast: Secondary | ICD-10-CM | POA: Diagnosis present

## 2020-07-19 DIAGNOSIS — Z79890 Hormone replacement therapy (postmenopausal): Secondary | ICD-10-CM

## 2020-07-19 DIAGNOSIS — E785 Hyperlipidemia, unspecified: Secondary | ICD-10-CM | POA: Diagnosis present

## 2020-07-19 DIAGNOSIS — Z79899 Other long term (current) drug therapy: Secondary | ICD-10-CM

## 2020-07-19 DIAGNOSIS — I10 Essential (primary) hypertension: Secondary | ICD-10-CM | POA: Diagnosis present

## 2020-07-19 DIAGNOSIS — I2510 Atherosclerotic heart disease of native coronary artery without angina pectoris: Secondary | ICD-10-CM | POA: Diagnosis present

## 2020-07-19 HISTORY — PX: LATISSIMUS FLAP TO BREAST: SHX5357

## 2020-07-19 LAB — GLUCOSE, CAPILLARY
Glucose-Capillary: 117 mg/dL — ABNORMAL HIGH (ref 70–99)
Glucose-Capillary: 241 mg/dL — ABNORMAL HIGH (ref 70–99)
Glucose-Capillary: 245 mg/dL — ABNORMAL HIGH (ref 70–99)
Glucose-Capillary: 249 mg/dL — ABNORMAL HIGH (ref 70–99)

## 2020-07-19 SURGERY — RECONSTRUCTION, BREAST, USING LATISSIMUS DORSI MYOCUTANEOUS FLAP
Anesthesia: General | Site: Breast | Laterality: Left

## 2020-07-19 MED ORDER — LIDOCAINE HCL 1 % IJ SOLN
INTRAMUSCULAR | Status: DC | PRN
  Administered 2020-07-19: 9.5 mL
  Administered 2020-07-19: 5 mL

## 2020-07-19 MED ORDER — DIPHENHYDRAMINE HCL 12.5 MG/5ML PO ELIX
12.5000 mg | ORAL_SOLUTION | Freq: Four times a day (QID) | ORAL | Status: DC | PRN
Start: 2020-07-19 — End: 2020-07-20

## 2020-07-19 MED ORDER — PHENYLEPHRINE HCL-NACL 10-0.9 MG/250ML-% IV SOLN
INTRAVENOUS | Status: AC
  Filled 2020-07-19: qty 250

## 2020-07-19 MED ORDER — LIDOCAINE 2% (20 MG/ML) 5 ML SYRINGE
INTRAMUSCULAR | Status: DC | PRN
  Administered 2020-07-19: 100 mg via INTRAVENOUS

## 2020-07-19 MED ORDER — DEXAMETHASONE SODIUM PHOSPHATE 10 MG/ML IJ SOLN
INTRAMUSCULAR | Status: AC
  Filled 2020-07-19: qty 1

## 2020-07-19 MED ORDER — LIDOCAINE HCL (PF) 1 % IJ SOLN
INTRAMUSCULAR | Status: AC
  Filled 2020-07-19: qty 30

## 2020-07-19 MED ORDER — "FIBRIN SEALANT 5 ML SINGLE DOSE KIT "
PACK | CUTANEOUS | Status: DC | PRN
  Administered 2020-07-19: 10 mL via TOPICAL

## 2020-07-19 MED ORDER — ROCURONIUM BROMIDE 10 MG/ML (PF) SYRINGE
PREFILLED_SYRINGE | INTRAVENOUS | Status: AC
  Filled 2020-07-19: qty 10

## 2020-07-19 MED ORDER — CHLORHEXIDINE GLUCONATE 0.12 % MT SOLN: 15.0000 mL | Freq: Once | OROMUCOSAL | Status: AC

## 2020-07-19 MED ORDER — ONDANSETRON HCL 4 MG/2ML IJ SOLN
INTRAMUSCULAR | Status: AC
  Filled 2020-07-19: qty 2

## 2020-07-19 MED ORDER — PHENYLEPHRINE HCL-NACL 10-0.9 MG/250ML-% IV SOLN
INTRAVENOUS | Status: DC | PRN
  Administered 2020-07-19: 30 ug/min via INTRAVENOUS

## 2020-07-19 MED ORDER — MIDAZOLAM HCL 2 MG/2ML IJ SOLN
INTRAMUSCULAR | Status: AC
  Filled 2020-07-19: qty 2

## 2020-07-19 MED ORDER — KETOROLAC TROMETHAMINE 30 MG/ML IJ SOLN
30.0000 mg | Freq: Once | INTRAMUSCULAR | Status: AC | PRN
  Administered 2020-07-19: 30 mg via INTRAVENOUS

## 2020-07-19 MED ORDER — ONDANSETRON HCL 4 MG/2ML IJ SOLN
INTRAMUSCULAR | Status: DC | PRN
  Administered 2020-07-19: 4 mg via INTRAVENOUS

## 2020-07-19 MED ORDER — HYDROMORPHONE HCL 1 MG/ML IJ SOLN
INTRAMUSCULAR | Status: AC
  Filled 2020-07-19: qty 1

## 2020-07-19 MED ORDER — IBUPROFEN 400 MG PO TABS
400.0000 mg | ORAL_TABLET | Freq: Four times a day (QID) | ORAL | Status: DC
  Administered 2020-07-19 – 2020-07-20 (×4): 400 mg via ORAL
  Filled 2020-07-19 (×4): qty 1

## 2020-07-19 MED ORDER — ACETAMINOPHEN 325 MG PO TABS
325.0000 mg | ORAL_TABLET | Freq: Four times a day (QID) | ORAL | Status: DC
  Administered 2020-07-19 – 2020-07-20 (×4): 325 mg via ORAL
  Filled 2020-07-19 (×4): qty 1

## 2020-07-19 MED ORDER — CHLORHEXIDINE GLUCONATE 0.12 % MT SOLN
OROMUCOSAL | Status: AC
  Administered 2020-07-19: 15 mL via OROMUCOSAL
  Filled 2020-07-19: qty 15

## 2020-07-19 MED ORDER — EPHEDRINE SULFATE 50 MG/ML IJ SOLN
INTRAMUSCULAR | Status: DC | PRN
  Administered 2020-07-19 (×3): 5 mg via INTRAVENOUS
  Administered 2020-07-19 (×2): 15 mg via INTRAVENOUS

## 2020-07-19 MED ORDER — FENTANYL CITRATE (PF) 250 MCG/5ML IJ SOLN
INTRAMUSCULAR | Status: AC
  Filled 2020-07-19: qty 5

## 2020-07-19 MED ORDER — SENNA 8.6 MG PO TABS
1.0000 | ORAL_TABLET | Freq: Two times a day (BID) | ORAL | Status: DC
  Administered 2020-07-19: 8.6 mg via ORAL
  Filled 2020-07-19: qty 1

## 2020-07-19 MED ORDER — HYDROCODONE-ACETAMINOPHEN 5-325 MG PO TABS
1.0000 | ORAL_TABLET | ORAL | Status: DC | PRN
  Administered 2020-07-20: 2 via ORAL
  Filled 2020-07-19: qty 2

## 2020-07-19 MED ORDER — HEMOSTATIC AGENTS (NO CHARGE) OPTIME
TOPICAL | Status: DC | PRN
  Administered 2020-07-19: 1 via TOPICAL

## 2020-07-19 MED ORDER — SIMETHICONE 80 MG PO CHEW: 40.0000 mg | CHEWABLE_TABLET | Freq: Four times a day (QID) | ORAL | Status: DC | PRN

## 2020-07-19 MED ORDER — MIDAZOLAM HCL 5 MG/5ML IJ SOLN
INTRAMUSCULAR | Status: DC | PRN
  Administered 2020-07-19: 2 mg via INTRAVENOUS

## 2020-07-19 MED ORDER — PROPOFOL 10 MG/ML IV BOLUS
INTRAVENOUS | Status: AC
  Filled 2020-07-19: qty 20

## 2020-07-19 MED ORDER — FENTANYL CITRATE (PF) 100 MCG/2ML IJ SOLN
INTRAMUSCULAR | Status: DC | PRN
  Administered 2020-07-19 (×4): 50 ug via INTRAVENOUS
  Administered 2020-07-19: 100 ug via INTRAVENOUS
  Administered 2020-07-19: 25 ug via INTRAVENOUS
  Administered 2020-07-19 (×3): 50 ug via INTRAVENOUS
  Administered 2020-07-19: 25 ug via INTRAVENOUS

## 2020-07-19 MED ORDER — LIDOCAINE 2% (20 MG/ML) 5 ML SYRINGE
INTRAMUSCULAR | Status: AC
  Filled 2020-07-19: qty 5

## 2020-07-19 MED ORDER — CIPROFLOXACIN IN D5W 400 MG/200ML IV SOLN
INTRAVENOUS | Status: AC
  Filled 2020-07-19: qty 200

## 2020-07-19 MED ORDER — SUGAMMADEX SODIUM 200 MG/2ML IV SOLN
INTRAVENOUS | Status: DC | PRN
  Administered 2020-07-19: 200 mg via INTRAVENOUS

## 2020-07-19 MED ORDER — METHOCARBAMOL 500 MG PO TABS
500.0000 mg | ORAL_TABLET | Freq: Four times a day (QID) | ORAL | Status: DC | PRN
  Administered 2020-07-19 (×2): 500 mg via ORAL
  Filled 2020-07-19: qty 1

## 2020-07-19 MED ORDER — CIPROFLOXACIN IN D5W 400 MG/200ML IV SOLN
400.0000 mg | Freq: Two times a day (BID) | INTRAVENOUS | Status: DC
  Administered 2020-07-19 – 2020-07-20 (×2): 400 mg via INTRAVENOUS
  Filled 2020-07-19 (×2): qty 200

## 2020-07-19 MED ORDER — DEXAMETHASONE SODIUM PHOSPHATE 10 MG/ML IJ SOLN
INTRAMUSCULAR | Status: DC | PRN
  Administered 2020-07-19: 4 mg via INTRAVENOUS

## 2020-07-19 MED ORDER — SODIUM CHLORIDE 0.9 % IR SOLN
Status: DC | PRN
  Administered 2020-07-19: 1000 mL

## 2020-07-19 MED ORDER — DIAZEPAM 2 MG PO TABS: 2.0000 mg | ORAL_TABLET | Freq: Two times a day (BID) | ORAL | Status: DC | PRN

## 2020-07-19 MED ORDER — PROMETHAZINE HCL 25 MG/ML IJ SOLN: 6.2500 mg | INTRAMUSCULAR | Status: DC | PRN

## 2020-07-19 MED ORDER — ONDANSETRON 4 MG PO TBDP
4.0000 mg | ORAL_TABLET | Freq: Four times a day (QID) | ORAL | Status: DC | PRN
Start: 2020-07-19 — End: 2020-07-20

## 2020-07-19 MED ORDER — HYDROMORPHONE HCL 1 MG/ML IJ SOLN
INTRAMUSCULAR | Status: AC
Start: 2020-07-19 — End: 2020-07-20
  Filled 2020-07-19: qty 1

## 2020-07-19 MED ORDER — KETOROLAC TROMETHAMINE 30 MG/ML IJ SOLN
INTRAMUSCULAR | Status: AC
  Filled 2020-07-19: qty 1

## 2020-07-19 MED ORDER — INSULIN ASPART 100 UNIT/ML IJ SOLN
INTRAMUSCULAR | Status: AC
  Filled 2020-07-19: qty 1

## 2020-07-19 MED ORDER — HYDROMORPHONE HCL 1 MG/ML IJ SOLN
1.0000 mg | INTRAMUSCULAR | Status: DC | PRN
  Administered 2020-07-19 – 2020-07-20 (×3): 1 mg via INTRAVENOUS
  Filled 2020-07-19 (×3): qty 1

## 2020-07-19 MED ORDER — CHLORHEXIDINE GLUCONATE CLOTH 2 % EX PADS: 6.0000 | MEDICATED_PAD | Freq: Once | CUTANEOUS | Status: DC

## 2020-07-19 MED ORDER — METHOCARBAMOL 500 MG PO TABS
ORAL_TABLET | ORAL | Status: AC
  Filled 2020-07-19: qty 1

## 2020-07-19 MED ORDER — ONDANSETRON HCL 4 MG/2ML IJ SOLN: 4.0000 mg | Freq: Four times a day (QID) | INTRAMUSCULAR | Status: DC | PRN

## 2020-07-19 MED ORDER — ORAL CARE MOUTH RINSE: 15.0000 mL | Freq: Once | OROMUCOSAL | Status: AC

## 2020-07-19 MED ORDER — DIPHENHYDRAMINE HCL 50 MG/ML IJ SOLN: 12.5000 mg | Freq: Four times a day (QID) | INTRAMUSCULAR | Status: DC | PRN

## 2020-07-19 MED ORDER — INSULIN ASPART 100 UNIT/ML IJ SOLN
0.0000 [IU] | Freq: Three times a day (TID) | INTRAMUSCULAR | Status: DC
Start: 2020-07-19 — End: 2020-07-20
  Administered 2020-07-19 (×2): 5 [IU] via SUBCUTANEOUS
  Administered 2020-07-20: 8 [IU] via SUBCUTANEOUS

## 2020-07-19 MED ORDER — EPHEDRINE 5 MG/ML INJ
INTRAVENOUS | Status: AC
  Filled 2020-07-19: qty 10

## 2020-07-19 MED ORDER — BUPIVACAINE-EPINEPHRINE (PF) 0.25% -1:200000 IJ SOLN
INTRAMUSCULAR | Status: AC
  Filled 2020-07-19: qty 30

## 2020-07-19 MED ORDER — LACTATED RINGERS IV SOLN: INTRAVENOUS | Status: DC

## 2020-07-19 MED ORDER — HYDROMORPHONE HCL 1 MG/ML IJ SOLN
0.2500 mg | INTRAMUSCULAR | Status: DC | PRN
  Administered 2020-07-19 (×3): 0.5 mg via INTRAVENOUS

## 2020-07-19 MED ORDER — CIPROFLOXACIN IN D5W 400 MG/200ML IV SOLN
400.0000 mg | INTRAVENOUS | Status: AC
  Administered 2020-07-19: 400 mg via INTRAVENOUS

## 2020-07-19 MED ORDER — PHENYLEPHRINE 40 MCG/ML (10ML) SYRINGE FOR IV PUSH (FOR BLOOD PRESSURE SUPPORT)
PREFILLED_SYRINGE | INTRAVENOUS | Status: DC | PRN
  Administered 2020-07-19: 80 ug via INTRAVENOUS
  Administered 2020-07-19: 40 ug via INTRAVENOUS
  Administered 2020-07-19: 80 ug via INTRAVENOUS
  Administered 2020-07-19: 40 ug via INTRAVENOUS
  Administered 2020-07-19 (×2): 80 ug via INTRAVENOUS
  Administered 2020-07-19: 120 ug via INTRAVENOUS
  Administered 2020-07-19: 40 ug via INTRAVENOUS
  Administered 2020-07-19: 80 ug via INTRAVENOUS

## 2020-07-19 MED ORDER — ROCURONIUM BROMIDE 100 MG/10ML IV SOLN
INTRAVENOUS | Status: DC | PRN
  Administered 2020-07-19: 80 mg via INTRAVENOUS

## 2020-07-19 MED ORDER — POLYETHYLENE GLYCOL 3350 17 G PO PACK: 17.0000 g | PACK | Freq: Every day | ORAL | Status: DC | PRN

## 2020-07-19 MED ORDER — KCL IN DEXTROSE-NACL 20-5-0.45 MEQ/L-%-% IV SOLN
INTRAVENOUS | Status: DC
  Filled 2020-07-19 (×2): qty 1000

## 2020-07-19 MED ORDER — INSULIN ASPART 100 UNIT/ML IJ SOLN: 0.0000 [IU] | Freq: Three times a day (TID) | INTRAMUSCULAR | Status: DC

## 2020-07-19 MED ORDER — PROPOFOL 10 MG/ML IV BOLUS
INTRAVENOUS | Status: DC | PRN
  Administered 2020-07-19: 150 mg via INTRAVENOUS

## 2020-07-19 MED ORDER — BUPIVACAINE-EPINEPHRINE 0.25% -1:200000 IJ SOLN
INTRAMUSCULAR | Status: DC | PRN
  Administered 2020-07-19: 5 mL
  Administered 2020-07-19: 9.5 mL

## 2020-07-19 SURGICAL SUPPLY — 56 items
BINDER BREAST XXLRG (GAUZE/BANDAGES/DRESSINGS) ×2 IMPLANT
BIOPATCH RED 1 DISK 7.0 (GAUZE/BANDAGES/DRESSINGS) ×6 IMPLANT
BLADE SURG 10 STRL SS (BLADE) ×2 IMPLANT
BLADE SURG 15 STRL LF DISP TIS (BLADE) ×1 IMPLANT
BLADE SURG 15 STRL SS (BLADE) ×2
CANISTER SUCT 3000ML PPV (MISCELLANEOUS) ×2 IMPLANT
CONNECTOR 5 IN 1 STRAIGHT STRL (MISCELLANEOUS) ×2 IMPLANT
COVER SURGICAL LIGHT HANDLE (MISCELLANEOUS) ×2 IMPLANT
DERMABOND ADVANCED (GAUZE/BANDAGES/DRESSINGS) ×1
DERMABOND ADVANCED .7 DNX12 (GAUZE/BANDAGES/DRESSINGS) ×1 IMPLANT
DRAIN CHANNEL 19F RND (DRAIN) ×6 IMPLANT
DRAPE HALF SHEET 40X57 (DRAPES) ×2 IMPLANT
DRAPE INCISE 23X17 IOBAN STRL (DRAPES) ×1
DRAPE INCISE IOBAN 23X17 STRL (DRAPES) ×1 IMPLANT
DRAPE INCISE IOBAN 85X60 (DRAPES) ×2 IMPLANT
DRAPE ORTHO SPLIT 77X108 STRL (DRAPES) ×4
DRAPE SURG ORHT 6 SPLT 77X108 (DRAPES) ×2 IMPLANT
DRAPE WARM FLUID 44X44 (DRAPES) ×2 IMPLANT
DRSG MEPILEX BORDER 4X8 (GAUZE/BANDAGES/DRESSINGS) ×2 IMPLANT
DRSG PAD ABDOMINAL 8X10 ST (GAUZE/BANDAGES/DRESSINGS) ×2 IMPLANT
DRSG TEGADERM 4X4.75 (GAUZE/BANDAGES/DRESSINGS) ×2 IMPLANT
ELECT BLADE 4.0 EZ CLEAN MEGAD (MISCELLANEOUS) ×2
ELECT BLADE 6.5 EXT (BLADE) ×4 IMPLANT
ELECT CAUTERY BLADE 6.4 (BLADE) ×2 IMPLANT
ELECT REM PT RETURN 9FT ADLT (ELECTROSURGICAL) ×2
ELECTRODE BLDE 4.0 EZ CLN MEGD (MISCELLANEOUS) ×1 IMPLANT
ELECTRODE REM PT RTRN 9FT ADLT (ELECTROSURGICAL) ×1 IMPLANT
EVACUATOR SILICONE 100CC (DRAIN) ×6 IMPLANT
GAUZE SPONGE 4X4 12PLY STRL (GAUZE/BANDAGES/DRESSINGS) ×2 IMPLANT
GAUZE SPONGE 4X4 12PLY STRL LF (GAUZE/BANDAGES/DRESSINGS) ×2 IMPLANT
GLOVE SRG 8 PF TXTR STRL LF DI (GLOVE) ×1 IMPLANT
GLOVE SURG ENC MOIS LTX SZ6.5 (GLOVE) ×4 IMPLANT
GLOVE SURG POLYISO LF SZ8 (GLOVE) ×2 IMPLANT
GLOVE SURG UNDER POLY LF SZ8 (GLOVE) ×2
GOWN STRL REUS W/ TWL LRG LVL3 (GOWN DISPOSABLE) ×2 IMPLANT
GOWN STRL REUS W/TWL LRG LVL3 (GOWN DISPOSABLE) ×4
KIT BASIN OR (CUSTOM PROCEDURE TRAY) ×2 IMPLANT
NEEDLE HYPO 25GX1X1/2 BEV (NEEDLE) ×2 IMPLANT
NS IRRIG 1000ML POUR BTL (IV SOLUTION) ×4 IMPLANT
PACK GENERAL/GYN (CUSTOM PROCEDURE TRAY) ×2 IMPLANT
PAD ARMBOARD 7.5X6 YLW CONV (MISCELLANEOUS) ×2 IMPLANT
PENCIL BUTTON HOLSTER BLD 10FT (ELECTRODE) ×2 IMPLANT
SPONGE LAP 18X18 RF (DISPOSABLE) ×4 IMPLANT
STRIP CLOSURE SKIN 1/2X4 (GAUZE/BANDAGES/DRESSINGS) ×4 IMPLANT
SUT MNCRL AB 3-0 PS2 18 (SUTURE) ×6 IMPLANT
SUT MNCRL AB 4-0 PS2 18 (SUTURE) ×14 IMPLANT
SUT PDS AB 2-0 CT2 27 (SUTURE) ×4 IMPLANT
SUT PDS AB 3-0 SH 27 (SUTURE) ×12 IMPLANT
SUT SILK 3 0 (SUTURE) ×8
SUT SILK 3-0 FS1 18XBRD (SUTURE) ×4 IMPLANT
SYR BULB IRRIG 60ML STRL (SYRINGE) ×2 IMPLANT
SYR CONTROL 10ML LL (SYRINGE) ×2 IMPLANT
TOWEL GREEN STERILE (TOWEL DISPOSABLE) ×2 IMPLANT
TOWEL GREEN STERILE FF (TOWEL DISPOSABLE) ×2 IMPLANT
TUBE CONNECTING 12X1/4 (SUCTIONS) ×4 IMPLANT
YANKAUER SUCT BULB TIP NO VENT (SUCTIONS) ×4 IMPLANT

## 2020-07-19 NOTE — Interval H&P Note (Signed)
History and Physical Interval Note:  07/19/2020 7:20 AM  Stacey Roberts  has presented today for surgery, with the diagnosis of Postoperative Breast Asymmetry, Status post radiation therapy.  The various methods of treatment have been discussed with the patient and family. After consideration of risks, benefits and other options for treatment, the patient has consented to  Procedure(s) with comments: Left breast radiation necrosis excision with latissimus muscle flap (Left) - 3 hours as a surgical intervention.  The patient's history has been reviewed, patient examined, no change in status, stable for surgery.  I have reviewed the patient's chart and labs.  Questions were answered to the patient's satisfaction.     [name]

## 2020-07-19 NOTE — Anesthesia Postprocedure Evaluation (Signed)
Anesthesia Post Note  Patient: Stacey Roberts  Procedure(s) Performed: Left breast radiation necrosis excision with latissimus muscle flap (Left: Breast)     Patient location during evaluation: PACU Anesthesia Type: General Level of consciousness: awake and alert Pain management: pain level controlled Vital Signs Assessment: post-procedure vital signs reviewed and stable Respiratory status: spontaneous breathing, nonlabored ventilation, respiratory function stable and patient connected to nasal cannula oxygen Cardiovascular status: blood pressure returned to baseline and stable Postop Assessment: no apparent nausea or vomiting Anesthetic complications: no   No notable events documented.  Last Vitals:  Vitals:   07/19/20 1330 07/19/20 1400  BP: 110/63 131/67  Pulse: 88 89  Resp: 11 17  Temp:  36.5 C  SpO2: 100% 100%    Last Pain:  Vitals:   07/19/20 1400  TempSrc: Oral  PainSc:                  Nyellie Yetter S
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
## 2020-07-19 NOTE — Discharge Instructions (Signed)
INSTRUCTIONS FOR AFTER SURGERY   You will likely have some questions about what to expect following your operation.  The following information will help you and your family understand what to expect when you are discharged from the hospital.  Following these guidelines will help ensure a smooth recovery and reduce risks of complications.  Postoperative instructions include information on: diet, wound care, medications and physical activity.  AFTER SURGERY Expect to go home after the procedure.  In some cases, you may need to spend one night in the hospital for observation.  DIET This surgery does not require a specific diet.  However, I have to mention that the healthier you eat the better your body can start healing. It is important to increasing your protein intake.  This means limiting the foods with added sugar.  Focus on fruits and vegetables and some meat. It is very important to drink water after your surgery.  If your urine is bright yellow, then it is concentrated, and you need to drink more water.  As a general rule after surgery, you should have 8 ounces of water every hour while awake.  If you find you are persistently nauseated or unable to take in liquids let us know.  NO TOBACCO USE or EXPOSURE.  This will slow your healing process and increase the risk of a wound.  WOUND CARE Clean with baby wipes for 3-5 days and then you can shower.  If you have a binder you may remove it to shower and then put it back on. If you have steri-strips / tape directly attached to your skin leave them in place. It is OK to get these wet.  No baths, pools or hot tubs for two weeks. We close your incision to leave the smallest and best-looking scar. No ointment or creams on your incisions until given the go ahead.  Especially not Neosporin (Too many skin reactions with this one).  A few weeks after surgery you can use Mederma and start massaging the scar. We ask you to wear your binder or sports bra for the  first 6 weeks around the clock, including while sleeping. This provides added comfort and helps reduce the fluid accumulation at the surgery site.  ACTIVITY No heavy lifting until cleared by the doctor.  It is OK to walk and climb stairs. In fact, moving your legs is very important to decrease your risk of a blood clot.  It will also help keep you from getting deconditioned.  Every 1 to 2 hours get up and walk for 5 minutes. This will help with a quicker recovery back to normal.  Let pain be your guide so you don't do too much.  NO, you cannot do the spring cleaning and don't plan on taking care of anyone else.  This is your time for TLC.   WORK Everyone returns to work at different times. As a rough guide, most people take at least 1 - 2 weeks off prior to returning to work. If you need documentation for your job, bring the forms to your postoperative follow up visit.  DRIVING Arrange for someone to bring you home from the hospital.  You may be able to drive a few days after surgery but not while taking any narcotics or valium.  BOWEL MOVEMENTS Constipation can occur after anesthesia and while taking pain medication.  It is important to stay ahead for your comfort.  We recommend taking Milk of Magnesia (2 tablespoons; twice a day) while taking the  pain pills.  SEROMA This is fluid your body tried to put in the surgical site.  This is normal but if it creates excessive pain and swelling let us know.  It usually decreases in a few weeks.  MEDICATIONS and PAIN CONTROL At your preoperative visit for you history and physical you were given the following medications: An antibiotic: Start this medication when you get home and take according to the instructions on the bottle. Zofran 4 mg:  This is to treat nausea and vomiting.  You can take this every 6 hours as needed and only if needed. Norco (hydrocodone/acetaminophen) 5/325 mg:  This is only to be used after you have taken the motrin or the  tylenol. Every 8 hours as needed. Over the counter Medication to take: Ibuprofen (Motrin) 600 mg:  Take this every 6 hours.  If you have additional pain then take 500 mg of the tylenol.  Only take the Norco after you have tried these two. Miralax or stool softener of choice: Take this according to the bottle if you take the [place] Call your surgeon's office if any of the following occur:  Fever 101 degrees F or greater  Excessive bleeding or fluid from the incision site.  Pain that increases over time without aid from the medications  Redness, warmth, or pus draining from incision sites  Persistent nausea or inability to take in liquids  Severe misshapen area that underwent the operation.   [facility] Plastic Surgery Specialist  What is the benefit of having a drain?  During surgery your tissue layers are separated.  This raw surface stimulates your body to fill the space with serous fluid.  This is normal but you don't want that fluid to collect and prevent healing.  A fluid collection can also become infected.  The Jackson-Pratt (JP) drain is used to eliminate this collection of fluid and allow the tissue to heal together.    Jackson-Pratt (JP) bulb    How to care for your drainage and suction unit at home Your drainage catheter will be connected to a collection device. The vacuum caused when the device is compressed allows drainage to collect in the device.    Wash your hands with soap and water before and after touching the system. Empty the JP drain every 12 hours once you get home from your procedure. Record the fluid amount on the record sheet included. Start with stripping the drain tube to push the clots or excess fluid to the bulb.  Do this by pinching the tube with one hand near your skin.  Then with the other hand squeeze the tubing and work it toward the bulb.  This should be done several times a day.  This may collapse the tube which will correct on its own.   Use  a safety pin to attach your collection device to your clothing so there is no tension on the insertion site.   If you have drainage at the skin insertion site, you can apply a gauze dressing and secure it with tape. If the drain falls out, apply a gauze dressing over the drain insertion site and secure with tape.   To empty the collection device:   Release the stopper on the top of the collection unit (bulb).  Pour contents into a measuring container such as a plastic medicine cup.  Record the day and amount of drainage on the attached sheet. This should be done at least twice a day.  To compress the Jackson-Pratt Bulb:  Release the stopper at the top of the bulb. Squeeze the bulb tightly in your fist, squeezing air out of the bulb.  Replace the stopper while the bulb is compressed.  Be careful not to spill the contents when squeezing the bulb. The drainage will start bright red and turn to pink and then yellow with time. IMPORTANT: If the bulb is not squeezed before adding the stopper it will not draw out the fluid.  Care for the JP drain site and your skin daily:  You may shower three days after surgery. Secure the drain to a ribbon or cloth around your waist while showering so it does not pull out while showering. Be sure your hands are cleaned with soap and water. Use a clean wet cotton swab to clean the skin around the drain site.  Use another cotton swab to place Vaseline or antibiotic ointment on the skin around the drain.     Contact your physician if any of the following occur:  The fluid in the bulb becomes cloudy. Your temperature is greater than 101.4.  The incision opens. If you have drainage at the skin insertion site, you can apply a gauze dressing and secure it with tape. If the drain falls out, apply a gauze dressing over the drain insertion site and secure with tape.  You will usually have more drainage when you are active than while you rest or are asleep. If the  drainage increases significantly or is bloody call the physician                             Bring this record with you to each office visit Date  Drainage Volume  Date   Drainage volume

## 2020-07-19 NOTE — Anesthesia Procedure Notes (Signed)
Procedure Name: Intubation Date/Time: 07/19/2020 7:48 AM Performed by: Ezequiel Kayser, CRNA Pre-anesthesia Checklist: Patient identified, Emergency Drugs available, Suction available and Patient being monitored Patient Re-evaluated:Patient Re-evaluated prior to induction Oxygen Delivery Method: Circle System Utilized Preoxygenation: Pre-oxygenation with 100% oxygen Induction Type: IV induction Ventilation: Mask ventilation without difficulty Laryngoscope Size: Mac and 3 Grade View: Grade I Tube type: Oral Tube size: 7.0 mm Number of attempts: 1 Airway Equipment and Method: Stylet and Oral airway Placement Confirmation: ETT inserted through vocal cords under direct vision, positive ETCO2 and breath sounds checked- equal and bilateral Secured at: 22 cm Tube secured with: Tape Dental Injury: Teeth and Oropharynx as per pre-operative assessment

## 2020-07-19 NOTE — Transfer of Care (Signed)
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  Last Pain:  Vitals:   07/19/20 0607  TempSrc:   PainSc: 0-No pain      Patients Stated Pain Goal: 1 (56/21/30 8657)  Complications: No notable events documented.

## 2020-07-20 ENCOUNTER — Encounter (HOSPITAL_COMMUNITY): Payer: Self-pay | Admitting: Plastic Surgery

## 2020-07-20 DIAGNOSIS — I10 Essential (primary) hypertension: Secondary | ICD-10-CM | POA: Diagnosis present

## 2020-07-20 DIAGNOSIS — I2510 Atherosclerotic heart disease of native coronary artery without angina pectoris: Secondary | ICD-10-CM | POA: Diagnosis present

## 2020-07-20 DIAGNOSIS — Z79899 Other long term (current) drug therapy: Secondary | ICD-10-CM | POA: Diagnosis not present

## 2020-07-20 DIAGNOSIS — Z853 Personal history of malignant neoplasm of breast: Secondary | ICD-10-CM | POA: Diagnosis not present

## 2020-07-20 DIAGNOSIS — G4733 Obstructive sleep apnea (adult) (pediatric): Secondary | ICD-10-CM | POA: Diagnosis present

## 2020-07-20 DIAGNOSIS — N650 Deformity of reconstructed breast: Secondary | ICD-10-CM | POA: Diagnosis present

## 2020-07-20 DIAGNOSIS — Z7982 Long term (current) use of aspirin: Secondary | ICD-10-CM | POA: Diagnosis not present

## 2020-07-20 DIAGNOSIS — K219 Gastro-esophageal reflux disease without esophagitis: Secondary | ICD-10-CM | POA: Diagnosis present

## 2020-07-20 DIAGNOSIS — Z881 Allergy status to other antibiotic agents status: Secondary | ICD-10-CM | POA: Diagnosis not present

## 2020-07-20 DIAGNOSIS — Z794 Long term (current) use of insulin: Secondary | ICD-10-CM | POA: Diagnosis not present

## 2020-07-20 DIAGNOSIS — Y842 Radiological procedure and radiotherapy as the cause of abnormal reaction of the patient, or of later complication, without mention of misadventure at the time of the procedure: Secondary | ICD-10-CM | POA: Diagnosis present

## 2020-07-20 DIAGNOSIS — Z79810 Long term (current) use of selective estrogen receptor modulators (SERMs): Secondary | ICD-10-CM | POA: Diagnosis not present

## 2020-07-20 DIAGNOSIS — Z85850 Personal history of malignant neoplasm of thyroid: Secondary | ICD-10-CM | POA: Diagnosis not present

## 2020-07-20 DIAGNOSIS — E890 Postprocedural hypothyroidism: Secondary | ICD-10-CM | POA: Diagnosis present

## 2020-07-20 DIAGNOSIS — I96 Gangrene, not elsewhere classified: Secondary | ICD-10-CM | POA: Diagnosis present

## 2020-07-20 DIAGNOSIS — Z7984 Long term (current) use of oral hypoglycemic drugs: Secondary | ICD-10-CM | POA: Diagnosis not present

## 2020-07-20 DIAGNOSIS — Z79890 Hormone replacement therapy (postmenopausal): Secondary | ICD-10-CM | POA: Diagnosis not present

## 2020-07-20 DIAGNOSIS — Z888 Allergy status to other drugs, medicaments and biological substances status: Secondary | ICD-10-CM | POA: Diagnosis not present

## 2020-07-20 DIAGNOSIS — Z8042 Family history of malignant neoplasm of prostate: Secondary | ICD-10-CM | POA: Diagnosis not present

## 2020-07-20 DIAGNOSIS — L7682 Other postprocedural complications of skin and subcutaneous tissue: Secondary | ICD-10-CM | POA: Diagnosis present

## 2020-07-20 DIAGNOSIS — N651 Disproportion of reconstructed breast: Secondary | ICD-10-CM | POA: Diagnosis present

## 2020-07-20 DIAGNOSIS — E785 Hyperlipidemia, unspecified: Secondary | ICD-10-CM | POA: Diagnosis present

## 2020-07-20 DIAGNOSIS — Z8249 Family history of ischemic heart disease and other diseases of the circulatory system: Secondary | ICD-10-CM | POA: Diagnosis not present

## 2020-07-20 DIAGNOSIS — Y781 Therapeutic (nonsurgical) and rehabilitative radiological devices associated with adverse incidents: Secondary | ICD-10-CM | POA: Diagnosis present

## 2020-07-20 DIAGNOSIS — Z880 Allergy status to penicillin: Secondary | ICD-10-CM | POA: Diagnosis not present

## 2020-07-20 LAB — GLUCOSE, CAPILLARY: Glucose-Capillary: 271 mg/dL — ABNORMAL HIGH (ref 70–99)

## 2020-07-20 LAB — HIV ANTIBODY (ROUTINE TESTING W REFLEX): HIV Screen 4th Generation wRfx: NONREACTIVE

## 2020-07-20 MED ORDER — LEVOTHYROXINE SODIUM 25 MCG PO TABS
137.0000 ug | ORAL_TABLET | Freq: Every day | ORAL | Status: DC
  Administered 2020-07-20: 137 ug via ORAL
  Filled 2020-07-20: qty 1

## 2020-07-20 NOTE — Discharge Summary (Signed)
Physician Discharge Summary  Patient ID: Stacey Roberts MRN: [id number] DOB/AGE: [birth year] 55 y.o.  Admit date: 07/19/2020 Discharge date: 07/20/2020  Admission Diagnoses:  Discharge Diagnoses:  Active Problems:   Breast asymmetry following reconstructive surgery   Discharged Condition: good  Hospital Course: 55 year old female presented to the [facility] operating room on 07/19/2020 for excision of left breast radiation necrosis and left latissimus myocutaneous muscle flap with Dr. Ulice Bold.  Patient postoperatively stayed overnight for observation.  She reports this a.m. that she is doing really well.  She reports that she has passed gas, she is not having any fevers, chills, nausea, vomiting.  She is not having any shortness of breath or chest pain.  She reports that pain is well controlled.  She has no complaints.  She is ready to go home.  Consults: None  Significant Diagnostic Studies: None  Treatments: IV hydration, antibiotics: Cipro, and analgesia: Vicodin and Dilaudid  Discharge Exam: Blood pressure (!) 99/55, pulse 96, temperature 97.7 F (36.5 C), temperature source Oral, resp. rate 18, height 5\' 4"  (1.626 m), weight 98.9 kg, last menstrual period 06/24/2010, SpO2 96 %. General appearance: alert, cooperative, no distress, and resting in bed, RN at bedside Head: Normocephalic, without obvious abnormality, atraumatic Resp: Unlabored Breasts: Left breast dressings in place.  Scant amount of drainage noted.  JP drain noted in left breast with serosanguineous drainage.  Left breast is soft, mild tenderness with palpation.  No bruising is noted.  No significant swelling is noted.  No hematoma or seroma noted with palpation. Extremities: extremities normal, atraumatic, no cyanosis or edema Pulses: 2+ and symmetric Incision/Wound: Left back incision with dressing in place.  No surrounding erythema.  Minimal tenderness with palpation.  JP drains in place with serosanguineous  drainage noted.  No cellulitic changes.  No foul odor.   Disposition: Discharge disposition: 01-Home or Self Care       Discharge Instructions     Call MD for:  difficulty breathing, headache or visual disturbances   Complete by: As directed    Call MD for:  extreme fatigue   Complete by: As directed    Call MD for:  hives   Complete by: As directed    Call MD for:  persistant dizziness or light-headedness   Complete by: As directed    Call MD for:  persistant nausea and vomiting   Complete by: As directed    Call MD for:  redness, tenderness, or signs of infection (pain, swelling, redness, odor or green/yellow discharge around incision site)   Complete by: As directed    Call MD for:  severe uncontrolled pain   Complete by: As directed    Call MD for:  temperature >100.4   Complete by: As directed    Diet - low sodium heart healthy   Complete by: As directed    Increase activity slowly   Complete by: As directed       Allergies as of 07/20/2020       Reactions   Penicillins Shortness Of Breath, Rash   Has patient had a PCN reaction causing immediate rash, facial/tongue/throat swelling, SOB or lightheadedness with hypotension: Yes Has patient had a PCN reaction causing severe rash involving mucus membranes or skin necrosis: Yes Has patient had a PCN reaction that required hospitalization No Has patient had a PCN reaction occurring within the last 10 years: Yes If all of the above answers are "NO", then may proceed with Cephalosporin use.   Clindamycin/lincomycin Other (See  Comments)   Severe stomach cramps   Ace Inhibitors Cough   REACTION: cough; denies airway involvement        Medication List     TAKE these medications    aspirin EC 81 MG tablet Take 1 tablet (81 mg total) by mouth daily.   ciprofloxacin 500 MG tablet Commonly known as: Cipro Take 1 tablet (500 mg total) by mouth 2 (two) times daily.   freestyle lancets 1 each by Other route 2 (two)  times daily. [name]   FreeStyle Lite Devi 1 each by Does not apply route 2 (two) times daily. [name]   FREESTYLE LITE test strip Generic drug: glucose blood 1 each by Other route 2 (two) times daily. [name]   multivitamin with minerals tablet Take 1 tablet by mouth daily. Woman's Gummies   nitroGLYCERIN 0.4 MG SL tablet Commonly known as: Nitrostat Place 1 tablet (0.4 mg total) under the tongue every 5 (five) minutes as needed for chest pain.   ondansetron 4 MG tablet Commonly known as: Zofran Take 1 tablet (4 mg total) by mouth every 8 (eight) hours as needed for nausea or vomiting.   pantoprazole 40 MG tablet Commonly known as: PROTONIX TAKE 1 TABLET BY MOUTH DAILY.   traMADol 50 MG tablet Commonly known as: ULTRAM TAKE 1 TABLET BY MOUTH EVERY 6 HOURS AS NEEDED FOR PAIN   Unifine Pentips 31G X 8 MM Misc Generic drug: Insulin Pen Needle use to inject twice a day       ASK your doctor about these medications    Farxiga 5 MG Tabs tablet Generic drug: dapagliflozin propanediol TAKE 1 TABLET BY MOUTH DAILY.   gabapentin 300 MG capsule Commonly known as: NEURONTIN Take 2 capsules (600 mg total) by mouth at bedtime.   levothyroxine 137 MCG tablet Commonly known as: SYNTHROID TAKE 1 TABLET (137 MCG TOTAL) BY MOUTH DAILY BEFORE BREAKFAST.   losartan 50 MG tablet Commonly known as: COZAAR TAKE 1 TABLET (50 MG TOTAL) BY MOUTH DAILY.   metFORMIN 500 MG 24 hr tablet Commonly known as: GLUCOPHAGE-XR TAKE 4 TABLETS BY MOUTH DAILY WITH BREAKFAST   metoprolol tartrate 50 MG tablet Commonly known as: LOPRESSOR TAKE 1 TABLET BY MOUTH TWO TIMES DAILY   Ozempic (0.25 or 0.5 MG/DOSE) 2 MG/1.5ML Sopn Generic drug: Semaglutide(0.25 or 0.5MG /DOS) INJECT 0.1875 MLS (0.25 MG TOTAL) INTO THE SKIN ONCE A WEEK.   Ozempic (0.25 or 0.5 MG/DOSE) 2 MG/1.5ML Sopn Generic drug: Semaglutide(0.25 or 0.5MG /DOS) INJECT 0.5 MG INTO THE SKIN ONCE A WEEK.   pregabalin 75 MG capsule Commonly  known as: LYRICA TAKE 1 CAPSULE (75 MG TOTAL) BY MOUTH 2 (TWO) TIMES DAILY.   rosuvastatin 20 MG tablet Commonly known as: CRESTOR TAKE 1 TABLET BY MOUTH DAILY.   Semglee (yfgn) 100 UNIT/ML Sopn Generic drug: Insulin Glargine-yfgn INJECT 60 UNITS INTO THE SKIN EVERY MORNING. AND PEN NEEDLES 1/DAY   Semglee (yfgn) 100 UNIT/ML Sopn Generic drug: Insulin Glargine-yfgn INJECT 60 UNDER THE SKIN EVERY MORNING   tamoxifen 20 MG tablet Commonly known as: NOLVADEX TAKE 1 TABLET BY MOUTH DAILY.        Follow-up Information     Dillingham, Alena Bills, DO Follow up in 10 day(s).   Specialty: Plastic Surgery Contact information: [address] [place] [address] [place] [address] [phone]                 [facility] [address] [place], [place] [address] [phone]  Signed: Molli Hazard  J Shelley Pooley 07/20/2020, 9:25 AM

## 2020-07-21 LAB — SURGICAL PATHOLOGY

## 2020-07-22 ENCOUNTER — Telehealth: Payer: Self-pay | Admitting: Plastic Surgery

## 2020-07-22 ENCOUNTER — Encounter: Payer: Self-pay | Admitting: *Deleted

## 2020-07-22 ENCOUNTER — Other Ambulatory Visit: Payer: Self-pay

## 2020-07-22 ENCOUNTER — Other Ambulatory Visit: Payer: Self-pay | Admitting: *Deleted

## 2020-07-22 MED ORDER — OXYCODONE-ACETAMINOPHEN 5-325 MG PO TABS
1.0000 | ORAL_TABLET | Freq: Four times a day (QID) | ORAL | 0 refills | Status: AC | PRN
  Filled 2020-07-22: qty 12, 3d supply, fill #0

## 2020-07-22 NOTE — Telephone Encounter (Signed)
Returned patients call. An additional prescription for pain medication was sent in to her pharmacy. This will be the last prescription until she is evaluated in our office.  She should only use the prescription as needed for severe pain.  Continue to use it with Tylenol and ibuprofen alternating.  Recommend to use 600 mg of ibuprofen every 8 hours and 500 mg of Tylenol every 8 hours.  Take the narcotics as needed every 6-8hours.  Recommend avoiding using the narcotics any sooner than every 6 hours.

## 2020-07-22 NOTE — Telephone Encounter (Signed)
Patient called to see if another prescription can be sent to her pharmacy for Vicodin. Per patient, she is in extreme pain and almost out of her current prescription. Sx 6/27. Please call to update/ advise [phone]. Thanks.

## 2020-07-22 NOTE — Patient Outreach (Signed)
[facility]) Care Management  07/22/2020  Stacey Roberts [birth year] [id number]   Transition of care call/case closure   Referral received:07/19/20 Initial outreach: 07/22/20  Insurance: [place] [employer]    Subjective: Initial successful telephone call to patient's preferred number in order to complete transition of care assessment; 2 HIPAA identifiers verified. Explained purpose of call and completed transition of care assessment.  Stacey Roberts states that she is doing okay. She denies post-operative problems, says surgical incisions are unremarkable. Her sister assisted with changing loosen dressing on back. Patient reports drainage tube in place is is managing care and keeping a log of output. She states surgical pain well managed with prescribed medications. She is tolerating diet, denies bowel or bladder problems.  Her sister is assisting with her recovery. Patient discussed improvement in blood sugar readings since at home, this am fasting reading 136.   Reviewed accessing the following [employer] Benefits : She discussed on going health issue with Diabetes and has been enrolled the [place] chronic disease management programs for several years, next telephone visit in a month.  She is unsure if she has the hospital indemnity plan, provided contact number to [employer] to file claim if needed.  She uses a [facility] outpatient pharmacy at [employer].    Objective:  Stacey Roberts  was hospitalized at [facility] 6/27-6/28/22  for left breast radiation necrosis excision with latissimus muscle flap  Comorbidites : Diabetes, A1c 7.7%, hyperlipidemia. She was discharged to home on 07/20/20  without the need for home health services or DME.   Assessment:  Patient voices good understanding of all discharge instructions.  See transition of care flowsheet for assessment details.   Plan:  Reviewed hospital discharge diagnosis of Excision of left breast radiation necrosis  latissimus muscle flap  and discharge treatment plan using hospital discharge instructions, assessing medication adherence, reviewing problems requiring provider notification, and discussing the importance of follow up with surgeon, primary care provider and/or specialists as directed.  Reviewed [place] healthy lifestyle program information to receive discounted premium for  2023   Step 1: Get  your annual physical  Step 2: Complete your health assessment  Step 3:Identify your current health status and complete the corresponding action step between January 24, 2020 and September 23, 2020.    Using [facility] website, verified that patient is an active participate in 's Active Health Management chronic disease management program.    No ongoing care management needs identified so will close case to [place] Management services and route successful outreach letter with [place] Management pamphlet and 24 Hour Nurse Line Magnet to [place] Management clinical pool to be mailed to patient's home address.  Thanked patient for their services to [facility].  Joylene Draft, RN, BSN  [place] Management Coordinator  [phone]- Mobile [phone]- Toll Free Main Office

## 2020-07-26 MED FILL — Tamoxifen Citrate Tab 20 MG (Base Equivalent): ORAL | 90 days supply | Qty: 90 | Fill #0 | Status: AC

## 2020-07-27 ENCOUNTER — Other Ambulatory Visit: Payer: Self-pay

## 2020-07-29 ENCOUNTER — Telehealth: Payer: Self-pay | Admitting: Plastic Surgery

## 2020-07-29 NOTE — Telephone Encounter (Signed)
Called and spoke with the patient regarding the message below.  Patient stated that she has 3 drains.  She has 2 drains in her back, and 1 in her (L) breast.  Patient stated that the drains in her back are given her a lot of discomfort.    Patient wanted to know if she could come in today to be seen.  Informed the patient that Dr. Marla Roe was not in the office today.  Patient verbalized understanding and stated okay she will wait until her appointment on tomorrow.    Asked the patient how much output was she getting in the drain.   She stated that the 2 drains in her back was putting out 20, and the 1 drain in her breast was putting out 5 or less.    Patient stated that she has not taking any of the Percocet since last Saturday, but she took 1 last night.    Informed the patient she could alternate the Tylenol and Ibuprofen in between taking the Percocet.  Patient verbalized understanding and agreed.//AB/CMA

## 2020-07-29 NOTE — Telephone Encounter (Signed)
Patient reports her drains coming out with extreme discomfort and tenderness. Please call to advise [phone]. Thanks.

## 2020-07-30 ENCOUNTER — Other Ambulatory Visit (HOSPITAL_COMMUNITY): Payer: Self-pay

## 2020-07-30 ENCOUNTER — Other Ambulatory Visit: Payer: Self-pay

## 2020-07-30 ENCOUNTER — Ambulatory Visit (INDEPENDENT_AMBULATORY_CARE_PROVIDER_SITE_OTHER): Payer: No Typology Code available for payment source | Admitting: Plastic Surgery

## 2020-07-30 DIAGNOSIS — Z170 Estrogen receptor positive status [ER+]: Secondary | ICD-10-CM

## 2020-07-30 DIAGNOSIS — Z923 Personal history of irradiation: Secondary | ICD-10-CM

## 2020-07-30 DIAGNOSIS — C50112 Malignant neoplasm of central portion of left female breast: Secondary | ICD-10-CM

## 2020-07-30 DIAGNOSIS — N6489 Other specified disorders of breast: Secondary | ICD-10-CM

## 2020-07-30 NOTE — Progress Notes (Signed)
   Subjective:    Patient ID: Stacey Roberts, female    DOB: 1965/05/24, 55 y.o.   MRN: [id number]  The patient is a 55 year old female here for follow-up left breast surgery.  She underwent a latissimus flap with excision of the fat necrosis.  The area looks stable.  There is a lot of swelling and bruising as expected.  Drain output is minimal.  No sign of hematoma.  The specimen showed necrosis but no sign of cancer.     Review of Systems  Constitutional:  Positive for activity change.  Eyes: Negative.   Respiratory: Negative.    Cardiovascular: Negative.   Genitourinary: Negative.       Objective:   Physical Exam Constitutional:      Appearance: Normal appearance.  HENT:     Head: Normocephalic and atraumatic.  Cardiovascular:     Rate and Rhythm: Normal rate.  Pulmonary:     Effort: Pulmonary effort is normal.  Neurological:     Mental Status: She is alert. Mental status is at baseline.        Assessment & Plan:     ICD-10-CM   1. Status post radiation therapy  Z92.3     2. Postoperative breast asymmetry  N64.89     3. Malignant neoplasm of central portion of left breast in female, estrogen receptor positive ([place])  C50.112    Z17.0        I removed the superior back drain.  Hopefully we can remove the breast drain on her next visit.  She can shower.  Continue with the sports bra or the binder.

## 2020-08-02 ENCOUNTER — Other Ambulatory Visit (HOSPITAL_BASED_OUTPATIENT_CLINIC_OR_DEPARTMENT_OTHER): Payer: Self-pay

## 2020-08-09 ENCOUNTER — Ambulatory Visit: Payer: No Typology Code available for payment source | Admitting: Endocrinology

## 2020-08-11 ENCOUNTER — Other Ambulatory Visit: Payer: Self-pay

## 2020-08-11 ENCOUNTER — Ambulatory Visit (INDEPENDENT_AMBULATORY_CARE_PROVIDER_SITE_OTHER): Payer: No Typology Code available for payment source | Admitting: Surgical

## 2020-08-11 DIAGNOSIS — Z170 Estrogen receptor positive status [ER+]: Secondary | ICD-10-CM

## 2020-08-11 DIAGNOSIS — N6489 Other specified disorders of breast: Secondary | ICD-10-CM

## 2020-08-11 DIAGNOSIS — Z923 Personal history of irradiation: Secondary | ICD-10-CM

## 2020-08-11 DIAGNOSIS — C50112 Malignant neoplasm of central portion of left female breast: Secondary | ICD-10-CM

## 2020-08-11 NOTE — Progress Notes (Addendum)
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  Patient is 3 weeks postop.  Patient reports that the drains have been causing her a lot of pain and disrupting her sleep.  She is very hopeful to have them removed today.  She reports that she noticed a left breast wound approximately 1 week ago and has been placing Neosporin on this area.  She is not having any infectious symptoms.  She reports that overall she is feeling well.    Output from left breast drain has been approximately 5 cc per 24 hours.  Output from left back drain has been approximately 10 cc per 24 hours.  Chaperone present on exam On exam left back incision is intact with some scabbing noted, no surrounding erythema or cellulitic change.  No subcutaneous fluid collection noted palpation.  Approximately 2 cc of serosanguineous drainage and left back JP drain.  Left breast flap with good color, good cap refill.  No cellulitic changes or erythema noted.  She does have a left breast wound that is approximately 2 x 2 cm.  There is no surrounding erythema or cellulitic changes.  The base of the wound bed has fibrinous exudate.  There is no cellulitic changes or purulent drainage noted.  No subcutaneous fluid collection noted with palpation.  Recommend continue to wear compressive garment, continue to avoid strenuous activities.  Avoid heavy lifting.  Left breast and left back JP drain were removed.  Patient tolerated this fine.  Recommend Vaseline and gauze to left breast wound -given the history of radiation this is not too surprising, she is overall doing well otherwise.  I do not see any sign of infection.  Recommend following up in 2 weeks for reevaluation.  Recommend calling with questions or concerns.  Patient still having difficulty with range of motion, will have her remain out of work until 08/30/2020.  We will  complete some FMLA forms for her today.

## 2020-08-13 ENCOUNTER — Encounter: Payer: No Typology Code available for payment source | Admitting: Surgical

## 2020-08-20 ENCOUNTER — Encounter: Payer: No Typology Code available for payment source | Admitting: Surgical

## 2020-08-27 ENCOUNTER — Ambulatory Visit (INDEPENDENT_AMBULATORY_CARE_PROVIDER_SITE_OTHER): Payer: No Typology Code available for payment source | Admitting: Plastic Surgery

## 2020-08-27 ENCOUNTER — Encounter: Payer: Self-pay | Admitting: Plastic Surgery

## 2020-08-27 ENCOUNTER — Other Ambulatory Visit: Payer: Self-pay

## 2020-08-27 ENCOUNTER — Telehealth: Payer: Self-pay

## 2020-08-27 DIAGNOSIS — Z923 Personal history of irradiation: Secondary | ICD-10-CM

## 2020-08-27 DIAGNOSIS — N6489 Other specified disorders of breast: Secondary | ICD-10-CM

## 2020-08-27 DIAGNOSIS — N651 Disproportion of reconstructed breast: Secondary | ICD-10-CM

## 2020-08-27 NOTE — Progress Notes (Signed)
   Subjective:    Patient ID: Stacey Roberts, female    DOB: [birth year], 55 y.o.   MRN: [id number]  The patient is a 55 year old female here for follow-up on her left breast reconstruction.  In June she underwent a latissimus muscle flap for her radiation damage.  She also had breast asymmetry.  Overall she is doing really well.  The flap is alive and appears to be very nice.  She has some fibrous tissue and loss of skin where the radiated tissue was.  This is on the superior lateral portion of the areola of the left breast.  Interestingly her back incision has some fibrous tissue as well.  This is likely a component of her overall health.  There does not appear to be any sign of infection or seroma.     Review of Systems  Constitutional: Negative.   HENT: Negative.    Eyes: Negative.   Respiratory: Negative.  Negative for chest tightness.   Cardiovascular: Negative.   Genitourinary: Negative.       Objective:   Physical Exam Vitals and nursing note reviewed.  Constitutional:      Appearance: Normal appearance.  Cardiovascular:     Rate and Rhythm: Normal rate.     Pulses: Normal pulses.  Pulmonary:     Effort: Pulmonary effort is normal.  Skin:    General: Skin is warm.     Capillary Refill: Capillary refill takes less than 2 seconds.     Findings: Lesion present.  Neurological:     Mental Status: She is alert. Mental status is at baseline.       Assessment & Plan:     ICD-10-CM   1. Status post radiation therapy  Z92.3     2. Postoperative breast asymmetry  N64.89     3. Breast asymmetry following reconstructive surgery  N65.1        Recommend trip to the OR for debridement and placement of ACell.  I think this will help it to heal much faster.  Patient is in agreement.

## 2020-08-27 NOTE — Telephone Encounter (Signed)
Stacey Roberts called from [facility] to say that she received the order for patient and would like to verify the frequency of change.  She also requested a copy of the patient's insurance card.  Please fax information to [phone].  Please call her with any questions - [phone].

## 2020-08-30 ENCOUNTER — Telehealth: Payer: Self-pay

## 2020-08-30 NOTE — Telephone Encounter (Signed)
Good Morning Dr. Keturah Barre,   [facility] called and asked how often should the pt change her dressings? I couldn't remember if you told the pt to change it daily. It wasn't in your last note. The duration of need is only x 30 days.   Thank you for your help!

## 2020-08-30 NOTE — Telephone Encounter (Signed)
Sent Dr. D a message to confirm the frequency of change. Will call Elmyra Ricks back and fax over legible copy of insurance card when I receive response from Dr. Keturah Barre.

## 2020-08-30 NOTE — Telephone Encounter (Signed)
Spoke to [place] and informed her that Dr. Marla Roe adv x pt to change dressing daily. She confirmed that they received ins info from pt.

## 2020-09-01 NOTE — Telephone Encounter (Addendum)
Noted. Confirmed with Dr. Keturah Barre Monday afternoon when in office and called [facility] back on 8/8. See 8/5 note x update.

## 2020-09-01 NOTE — Telephone Encounter (Signed)
Received fax from [facility] stating that they have provided service x the pt; no further action is required.

## 2020-09-03 ENCOUNTER — Other Ambulatory Visit: Payer: Self-pay

## 2020-09-03 ENCOUNTER — Other Ambulatory Visit: Payer: Self-pay | Admitting: Endocrinology

## 2020-09-03 MED ORDER — METFORMIN HCL ER 500 MG PO TB24
ORAL_TABLET | ORAL | 3 refills | Status: DC
  Filled 2020-09-03: qty 360, 90d supply, fill #0
  Filled 2020-12-01: qty 360, 90d supply, fill #1
  Filled 2021-03-07: qty 360, 90d supply, fill #2
  Filled 2021-06-06: qty 360, 90d supply, fill #3

## 2020-09-03 MED FILL — Dapagliflozin Propanediol Tab 5 MG (Base Equivalent): ORAL | 90 days supply | Qty: 90 | Fill #1 | Status: AC

## 2020-09-03 MED FILL — Losartan Potassium Tab 50 MG: ORAL | 90 days supply | Qty: 90 | Fill #1 | Status: AC

## 2020-09-03 MED FILL — Insulin Glargine-yfgn Soln Pen-Injector 100 Unit/ML: SUBCUTANEOUS | 50 days supply | Qty: 30 | Fill #0 | Status: AC

## 2020-09-07 ENCOUNTER — Other Ambulatory Visit: Payer: Self-pay

## 2020-09-07 ENCOUNTER — Ambulatory Visit (INDEPENDENT_AMBULATORY_CARE_PROVIDER_SITE_OTHER): Payer: No Typology Code available for payment source | Admitting: Endocrinology

## 2020-09-07 VITALS — BP 128/60 | HR 92 | Ht 64.0 in | Wt 220.6 lb

## 2020-09-07 DIAGNOSIS — Z794 Long term (current) use of insulin: Secondary | ICD-10-CM

## 2020-09-07 DIAGNOSIS — E1142 Type 2 diabetes mellitus with diabetic polyneuropathy: Secondary | ICD-10-CM

## 2020-09-07 LAB — POCT GLYCOSYLATED HEMOGLOBIN (HGB A1C): Hemoglobin A1C: 8.2 % — AB (ref 4.0–5.6)

## 2020-09-07 MED ORDER — SEMAGLUTIDE (1 MG/DOSE) 4 MG/3ML ~~LOC~~ SOPN
1.0000 mg | PEN_INJECTOR | SUBCUTANEOUS | 3 refills | Status: DC
  Filled 2020-09-07: qty 3, 28d supply, fill #0
  Filled 2020-11-01: qty 9, 84d supply, fill #1
  Filled 2021-01-31: qty 9, 84d supply, fill #2

## 2020-09-07 MED ORDER — INSULIN GLARGINE-YFGN 100 UNIT/ML ~~LOC~~ SOPN
45.0000 [IU] | PEN_INJECTOR | SUBCUTANEOUS | 3 refills | Status: DC
  Filled 2020-09-07: qty 45, 100d supply, fill #0
  Filled 2020-11-01: qty 45, 90d supply, fill #0
  Filled 2021-04-12: qty 45, 90d supply, fill #1
  Filled 2021-07-19: qty 45, 90d supply, fill #2

## 2020-09-07 NOTE — Progress Notes (Signed)
Subjective:    Patient ID: Stacey Roberts, female    DOB: 04-11-1965, 55 y.o.   MRN: [id number]  HPI Pt has stage-1 PTC of the thyroid.    2/11: thyroidectomy: T1b N0 M0.  4/11: RAI 103 mCi, with thyrogen.  12/11: neck US: single enlarged right cervical lymph node, nonspecific.   6/12: body scan (thyrogen) neg.   12/12: [place]: lymph node is stable.  6/13: [place]: overall node morphology and volume show very little change.   9/14  TG undetectable (ab neg) 3/15: TG undetectable (ab neg) 10/15 TG undetectable (ab neg).  3/17 TG undetectable (ab neg).   4/19 TG undetectable (ab neg).   6/20 [place] no tumor 2/20 TG undetectable (ab neg).   3/21 TG undetectable (ab neg).   1/22 TG undetectable (ab neg). Goal TSH is normal, due to long disease-free interval.   Pt returns for f/u of diabetes mellitus:  DM type: Insulin-requiring type 2 Dx'ed: 8264 Complications: PPN and CAD.  Therapy: insulin since early 2017, Ozempic, and 2 oral meds.   GDM: never.  DKA: never.   Severe hypoglycemia: never.   Pancreatitis: never.  Other: she declines multiple daily injections; edema precudes pioglitizone rx.  She declines bariatric surgery for now.  She declines to add bromocriptine; she did not tolerate Trulicity (nausea). Interval history: She takes Lantus 0-60 units QAM, but mostly 45-60 units.  no cbg record, but states cbg varies from 130-175.  She checks fasting.   Past Medical History:  Diagnosis Date   Allergy    seasonal   BRCA negative 2017   Breast cancer ([place]) 2014   Breast cancer ([place]) 2017   Bronchitis    hx of   Coronary artery disease 06/2019   80% mid RCA stenosis - medical management   Diabetes mellitus, type II ([place])    Endometrial hyperplasia 2014   Mirena placed; Dr. 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 Buddy Duty    Past Surgical History:  Procedure Laterality Date   BREAST BIOPSY  2000   s/p   BREAST BIOPSY Left 01/22/2013   Procedure: RE-EXCISION LEFT BREAST DCIS;  Surgeon: Harl Bowie, MD;  Location: [place];  Service: General;  Laterality: Left;   BREAST LUMPECTOMY Left 01/06/2013   Procedure: LUMPECTOMY;  Surgeon: Harl Bowie, MD;  Location: [place];  Service: General;  Laterality: Left;   BREAST LUMPECTOMY Left 2017   BREAST LUMPECTOMY WITH NEEDLE LOCALIZATION AND AXILLARY SENTINEL LYMPH NODE BX Left 09/30/2015   Procedure: Left BREAST LUMPECTOMY WITH NEEDLE LOCALIZATION AND AXILLARY SENTINEL LYMPH NODE BX;  Surgeon: Coralie Keens, MD;  Location: [place];  Service: General;  Laterality: Left;   BREAST RECONSTRUCTION Bilateral 10/12/2015   Procedure: BREAST oncoplastic RECONSTRUCTION;  Surgeon: Irene Limbo, MD;  Location:  [place];  Service: Plastics;  Laterality: Bilateral;   BREAST REDUCTION SURGERY Bilateral 10/12/2015   Procedure: MAMMARY REDUCTION  (BREAST);  Surgeon: Irene Limbo, MD;  Location: [place];  Service: Plastics;  Laterality: Bilateral;   CORONARY ANGIOPLASTY     DILATION AND CURETTAGE OF UTERUS  2004   s/p   EXCISION OF BREAST LESION Left 10/12/2015   Procedure: Re-EXCISION OF BREAST;  Surgeon: Coralie Keens, MD;  Location: [place];  Service: General;  Laterality: Left;   LATISSIMUS FLAP TO BREAST Left 07/19/2020   Procedure: Left breast radiation necrosis excision with  latissimus muscle flap;  Surgeon: Wallace Going, DO;  Location: [place];  Service: Plastics;  Laterality: Left;  3 hours   LEFT HEART CATH AND CORONARY ANGIOGRAPHY N/A 06/27/2019   Procedure: LEFT HEART CATH AND CORONARY ANGIOGRAPHY;  Surgeon: Nelva Bush, MD;  Location: [facility];  Service: Cardiovascular;  Laterality: N/A;   REDUCTION MAMMAPLASTY Bilateral 09/2015   THYROIDECTOMY  02/2009   Dr Harlow Asa   TONSILLECTOMY  1982   TONSILLECTOMY      Social History   Socioeconomic History   Marital status: Single    Spouse name: Not on file   Number of children: Not on file   Years of education: Not on file   Highest education level: Not on file  Occupational History    Employer: [employer]    Comment: Outpatient scheduling in radiology  Tobacco Use   Smoking status: Former    Packs/day: 0.50    Types: Cigarettes    Quit date: 09/29/2007    Years since quitting: 12.9   Smokeless tobacco: Never   Tobacco comments:    Smoked on and off for 20 years. Quit about 8 years ago (as of 08/2015)  Vaping Use   Vaping Use: Never used  Substance and Sexual Activity   Alcohol use: Not Currently    Alcohol/week: 0.0 standard drinks   Drug use: No   Sexual activity: Not Currently  Other Topics Concern   Not on file  Social History Narrative   The patient is divorced, moved to [place] in   2006 from [place].  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     No alcohol.  Tobacco use:  She quit 5 years ago.  She smoked on   and off for approximately 20 years.  No history of recreational drug   Use. Drinks two cups of coffee per work day.    Social Determinants of Health   Financial Resource Strain: Not on file  Food Insecurity: Not on file  Transportation Needs: Not on file  Physical Activity: Not on file  Stress: Not on file  Social Connections: Not on file  Intimate Partner  Violence: Not on file    Current Outpatient Medications on File Prior to Visit  Medication Sig Dispense Refill   aspirin EC 81 MG tablet Take 1 tablet (81 mg total) by mouth daily. 90 tablet 3   Blood Glucose Monitoring Suppl (FREESTYLE LITE) DEVI 1 each by Does not apply route 2 (two) times daily. [name] 1 each 0   dapagliflozin propanediol (FARXIGA) 5 MG TABS tablet TAKE 1 TABLET BY MOUTH DAILY. (Patient taking differently: Take 5 mg by mouth in the morning.) 90 tablet 3   gabapentin (NEURONTIN) 300 MG capsule Take 2 capsules (600 mg  total) by mouth at bedtime. (Patient taking differently: Take 600 mg by mouth at bedtime as needed (pain).) 180 capsule 4   glucose blood (FREESTYLE LITE) test strip 1 each by Other route 2 (two) times daily. [name] 200 each 0   Insulin Pen Needle (UNIFINE PENTIPS) 31G X 8 MM MISC use to inject twice a day 100 each 3   Lancets (FREESTYLE) lancets 1 each by Other route 2 (two) times daily. [name] 200 each 0   levothyroxine (SYNTHROID) 137 MCG tablet TAKE 1 TABLET (137 MCG TOTAL) BY MOUTH DAILY BEFORE BREAKFAST. (Patient taking differently: Take 137 mcg by mouth daily before breakfast.) 90 tablet 3   losartan (COZAAR) 50 MG tablet TAKE 1 TABLET (50 MG TOTAL) BY MOUTH DAILY. (Patient taking differently: Take 50 mg by mouth in the morning.) 90 tablet 2   metFORMIN (GLUCOPHAGE-XR) 500 MG 24 hr tablet TAKE 4 TABLETS BY MOUTH DAILY WITH BREAKFAST 360 tablet 3   metoprolol tartrate (LOPRESSOR) 50 MG tablet TAKE 1 TABLET BY MOUTH TWO TIMES DAILY (Patient taking differently: Take 50 mg by mouth 2 (two) times daily.) 180 tablet 1   Multiple Vitamins-Minerals (MULTIVITAMIN WITH MINERALS) tablet Take 1 tablet by mouth daily. Woman's Gummies     pantoprazole (PROTONIX) 40 MG tablet TAKE 1 TABLET BY MOUTH DAILY. 90 tablet 0   pregabalin (LYRICA) 75 MG capsule TAKE 1 CAPSULE (75 MG TOTAL) BY MOUTH 2 (TWO) TIMES DAILY. (Patient taking differently: Take 75 mg by mouth in the morning.)  180 capsule 3   rosuvastatin (CRESTOR) 20 MG tablet TAKE 1 TABLET BY MOUTH DAILY. (Patient taking differently: Take 20 mg by mouth in the morning.) 90 tablet 3   tamoxifen (NOLVADEX) 20 MG tablet TAKE 1 TABLET BY MOUTH DAILY. (Patient taking differently: Take 20 mg by mouth in the morning.) 90 tablet 3   nitroGLYCERIN (NITROSTAT) 0.4 MG SL tablet Place 1 tablet (0.4 mg total) under the tongue every 5 (five) minutes as needed for chest pain. 25 tablet prn   No current facility-administered medications on file prior to visit.    Allergies  Allergen Reactions   Penicillins Shortness Of Breath and Rash    Has patient had a PCN reaction causing immediate rash, facial/tongue/throat swelling, SOB or lightheadedness with hypotension: Yes Has patient had a PCN reaction causing severe rash involving mucus membranes or skin necrosis: Yes Has patient had a PCN reaction that required hospitalization No Has patient had a PCN reaction occurring within the last 10 years: Yes If all of the above answers are "NO", then may proceed with Cephalosporin use.    Clindamycin/Lincomycin Other (See Comments)    Severe stomach cramps   Ace Inhibitors Cough    REACTION: cough; denies airway involvement     Family History  Problem Relation Age of Onset   Dementia Father        deceased age 65 secondary to dementia   Bipolar disorder Father    Emphysema Father    Heart disease Father    COPD Father        smoker   Hypertension Father    Depression Father    Alcoholism Father    CAD Mother 61       Died age 90 of CAD   Heart disease Mother        CABG x 2   Graves' disease Mother    Hypertension Mother    Thyroid disease Mother    Heart attack Mother 7   CAD  Maternal Grandfather 89   Heart disease Maternal Grandfather    Heart attack Maternal Grandfather 79   Other Sister 25       hysterectomy for fibroids   Prostate cancer Brother        low grade; w/ surveillance   Thyroid nodules Brother 55    Heart disease Brother 84       CABG x 4   Heart disease Sister        CABG x 2   Heart attack Sister 72   Fibroids Other        niece dx approx 46   Hypertension Other    Kidney cancer Cousin        maternal 1st cousin dx 73-47; former smoker   Lung cancer Other        maternal great aunt (MGM's sister); not a smoker   Cancer Other        nephew dx neuroblastoma at 24.49 years old   Colon cancer Neg Hx    Colon polyps Neg Hx     BP 128/60 (BP Location: Right Arm, Patient Position: Sitting, Cuff Size: Normal)   Pulse 92   Ht 5' 4" (1.626 m)   Wt 220 lb 9.6 oz (100.1 kg)   LMP 12/23/2012   SpO2 97%   BMI 37.87 kg/m    Review of Systems She denies hypoglycemia/N/V/HB    Objective:   Physical Exam Pulses: dorsalis pedis intact bilat.   MSK: no deformity of the feet CV: no leg edema Skin:  no ulcer on the feet.  normal color and temp on the feet. Neuro: sensation is intact to touch on the feet.     Lab Results  Component Value Date   HGBA1C 8.2 (A) 09/07/2020      Assessment & Plan:  Insulin-requiring type 2 DM: uncontrolled.    Patient Instructions  I have sent a prescription to your pharmacy, to increase the Ozempic. Take Semglee, 45 units each morning. Please continue the same other 2 diabetes medications.  check your blood sugar twice a day.  vary the time of day when you check, between before the 3 meals, and at bedtime.  also check if you have symptoms of your blood sugar being too high or too low.  please keep a record of the readings and bring it to your next appointment here (or you can bring the meter itself).  You can write it on any piece of paper.  please call us sooner if your blood sugar goes below 70, or if you have a lot of readings over 200.  Please come back for a follow-up appointment in 3 months.

## 2020-09-07 NOTE — Patient Instructions (Signed)
I have sent a prescription to your pharmacy, to increase the Ozempic. Take Semglee, 45 units each morning. Please continue the same other 2 diabetes medications.  check your blood sugar twice a day.  vary the time of day when you check, between before the 3 meals, and at bedtime.  also check if you have symptoms of your blood sugar being too high or too low.  please keep a record of the readings and bring it to your next appointment here (or you can bring the meter itself).  You can write it on any piece of paper.  please call us sooner if your blood sugar goes below 70, or if you have a lot of readings over 200.  Please come back for a follow-up appointment in 3 months.

## 2020-09-08 ENCOUNTER — Ambulatory Visit: Payer: No Typology Code available for payment source

## 2020-09-08 DIAGNOSIS — Z8742 Personal history of other diseases of the female genital tract: Secondary | ICD-10-CM

## 2020-09-09 NOTE — H&P (View-Only) (Signed)
Patient ID: Stacey Roberts, female    DOB: 19-Nov-1965, 55 y.o.   MRN: [id number]  Chief Complaint  Patient presents with   Follow-up      ICD-10-CM   1. Status post radiation therapy  Z92.3     2. Postoperative breast asymmetry  N64.89     3. Breast asymmetry  N64.89     4. Type 2 diabetes mellitus with diabetic polyneuropathy, unspecified whether long term insulin use (HCC)  E11.42     5. History of radiation exposure  Z92.3        History of Present Illness: Stacey Roberts is a 55 y.o.  female  with a history of recent breast surgery.  She has a history of left breast cancer which was diagnosed in 2014 with subsequent radiation.  She developed left breast radiation necrosis and subsequently underwent left latissimus myocutaneous muscle flap with Dr. Marla Roe 07/19/2020.  She presents for preoperative evaluation for upcoming procedure, excision of back and left breast wound, scheduled for 09/29/2020 with Dr. Marla Roe.  The patient has not had problems with anesthesia. No history of DVT/PE.  No family history of DVT/PE.  No family or personal history of bleeding or clotting disorders.  Patient is not currently taking any blood thinners.  No history of CVA/MI.   Summary of Previous Visit: Patient underwent lumpectomy in 2014, subsequently had cluster calcifications and reexcision in 2017.  She had positive margins and underwent reexcision and decided on bilateral oncoplastic breast reductions.  She then had postoperative radiation.  She subsequently had tightness and increased pain in the left lateral breast which was thought to be fat necrosis/radiation necrosis which was excised on 07/19/2020 with Dr. Marla Roe.  She subsequently developed a left breast wound along the radiated tissue as well as a wound of her back incision.  PMH Significant for: History of left breast radiation, history of diabetes mellitus with most recent A1c 8.2 History of coronary artery disease, PCOS.   Reports she had a sleep apnea test when she first moved to the [place] area, reports she has never needed to use a CPAP and she is unsure why OSA is a diagnosis in her chart.  She reports no issues with sleeping other than some sleep changes that she attributes to aging.  Patient currently on insulin.  Currently on ASA 81 mg.  She is currently on tamoxifen, recommend holding at least 1 week prior to surgery and 2 weeks after surgery.  No recent changes in her health, no infectious symptoms.  Past Medical History: Allergies: Allergies  Allergen Reactions   Penicillins Shortness Of Breath and Rash    Has patient had a PCN reaction causing immediate rash, facial/tongue/throat swelling, SOB or lightheadedness with hypotension: Yes Has patient had a PCN reaction causing severe rash involving mucus membranes or skin necrosis: Yes Has patient had a PCN reaction that required hospitalization No Has patient had a PCN reaction occurring within the last 10 years: Yes If all of the above answers are "NO", then may proceed with Cephalosporin use.    Clindamycin/Lincomycin Other (See Comments)    Severe stomach cramps   Ace Inhibitors Cough    REACTION: cough; denies airway involvement     Current Medications:  Current Outpatient Medications:    aspirin EC 81 MG tablet, Take 1 tablet (81 mg total) by mouth daily., Disp: 90 tablet, Rfl: 3   Blood Glucose Monitoring Suppl (FREESTYLE LITE) DEVI, 1 each by Does not apply route  Patient ID: Stacey Roberts, female    DOB: 19-Nov-1965, 55 y.o.   MRN: [id number]  Chief Complaint  Patient presents with   Follow-up      ICD-10-CM   1. Status post radiation therapy  Z92.3     2. Postoperative breast asymmetry  N64.89     3. Breast asymmetry  N64.89     4. Type 2 diabetes mellitus with diabetic polyneuropathy, unspecified whether long term insulin use (HCC)  E11.42     5. History of radiation exposure  Z92.3        History of Present Illness: Stacey Roberts is a 55 y.o.  female  with a history of recent breast surgery.  She has a history of left breast cancer which was diagnosed in 2014 with subsequent radiation.  She developed left breast radiation necrosis and subsequently underwent left latissimus myocutaneous muscle flap with Dr. Marla Roe 07/19/2020.  She presents for preoperative evaluation for upcoming procedure, excision of back and left breast wound, scheduled for 09/29/2020 with Dr. Marla Roe.  The patient has not had problems with anesthesia. No history of DVT/PE.  No family history of DVT/PE.  No family or personal history of bleeding or clotting disorders.  Patient is not currently taking any blood thinners.  No history of CVA/MI.   Summary of Previous Visit: Patient underwent lumpectomy in 2014, subsequently had cluster calcifications and reexcision in 2017.  She had positive margins and underwent reexcision and decided on bilateral oncoplastic breast reductions.  She then had postoperative radiation.  She subsequently had tightness and increased pain in the left lateral breast which was thought to be fat necrosis/radiation necrosis which was excised on 07/19/2020 with Dr. Marla Roe.  She subsequently developed a left breast wound along the radiated tissue as well as a wound of her back incision.  PMH Significant for: History of left breast radiation, history of diabetes mellitus with most recent A1c 8.2 History of coronary artery disease, PCOS.   Reports she had a sleep apnea test when she first moved to the [place] area, reports she has never needed to use a CPAP and she is unsure why OSA is a diagnosis in her chart.  She reports no issues with sleeping other than some sleep changes that she attributes to aging.  Patient currently on insulin.  Currently on ASA 81 mg.  She is currently on tamoxifen, recommend holding at least 1 week prior to surgery and 2 weeks after surgery.  No recent changes in her health, no infectious symptoms.  Past Medical History: Allergies: Allergies  Allergen Reactions   Penicillins Shortness Of Breath and Rash    Has patient had a PCN reaction causing immediate rash, facial/tongue/throat swelling, SOB or lightheadedness with hypotension: Yes Has patient had a PCN reaction causing severe rash involving mucus membranes or skin necrosis: Yes Has patient had a PCN reaction that required hospitalization No Has patient had a PCN reaction occurring within the last 10 years: Yes If all of the above answers are "NO", then may proceed with Cephalosporin use.    Clindamycin/Lincomycin Other (See Comments)    Severe stomach cramps   Ace Inhibitors Cough    REACTION: cough; denies airway involvement     Current Medications:  Current Outpatient Medications:    aspirin EC 81 MG tablet, Take 1 tablet (81 mg total) by mouth daily., Disp: 90 tablet, Rfl: 3   Blood Glucose Monitoring Suppl (FREESTYLE LITE) DEVI, 1 each by Does not apply route  Patient ID: Stacey Roberts, female    DOB: 19-Nov-1965, 55 y.o.   MRN: [id number]  Chief Complaint  Patient presents with   Follow-up      ICD-10-CM   1. Status post radiation therapy  Z92.3     2. Postoperative breast asymmetry  N64.89     3. Breast asymmetry  N64.89     4. Type 2 diabetes mellitus with diabetic polyneuropathy, unspecified whether long term insulin use (HCC)  E11.42     5. History of radiation exposure  Z92.3        History of Present Illness: Stacey Roberts is a 55 y.o.  female  with a history of recent breast surgery.  She has a history of left breast cancer which was diagnosed in 2014 with subsequent radiation.  She developed left breast radiation necrosis and subsequently underwent left latissimus myocutaneous muscle flap with Dr. Marla Roe 07/19/2020.  She presents for preoperative evaluation for upcoming procedure, excision of back and left breast wound, scheduled for 09/29/2020 with Dr. Marla Roe.  The patient has not had problems with anesthesia. No history of DVT/PE.  No family history of DVT/PE.  No family or personal history of bleeding or clotting disorders.  Patient is not currently taking any blood thinners.  No history of CVA/MI.   Summary of Previous Visit: Patient underwent lumpectomy in 2014, subsequently had cluster calcifications and reexcision in 2017.  She had positive margins and underwent reexcision and decided on bilateral oncoplastic breast reductions.  She then had postoperative radiation.  She subsequently had tightness and increased pain in the left lateral breast which was thought to be fat necrosis/radiation necrosis which was excised on 07/19/2020 with Dr. Marla Roe.  She subsequently developed a left breast wound along the radiated tissue as well as a wound of her back incision.  PMH Significant for: History of left breast radiation, history of diabetes mellitus with most recent A1c 8.2 History of coronary artery disease, PCOS.   Reports she had a sleep apnea test when she first moved to the [place] area, reports she has never needed to use a CPAP and she is unsure why OSA is a diagnosis in her chart.  She reports no issues with sleeping other than some sleep changes that she attributes to aging.  Patient currently on insulin.  Currently on ASA 81 mg.  She is currently on tamoxifen, recommend holding at least 1 week prior to surgery and 2 weeks after surgery.  No recent changes in her health, no infectious symptoms.  Past Medical History: Allergies: Allergies  Allergen Reactions   Penicillins Shortness Of Breath and Rash    Has patient had a PCN reaction causing immediate rash, facial/tongue/throat swelling, SOB or lightheadedness with hypotension: Yes Has patient had a PCN reaction causing severe rash involving mucus membranes or skin necrosis: Yes Has patient had a PCN reaction that required hospitalization No Has patient had a PCN reaction occurring within the last 10 years: Yes If all of the above answers are "NO", then may proceed with Cephalosporin use.    Clindamycin/Lincomycin Other (See Comments)    Severe stomach cramps   Ace Inhibitors Cough    REACTION: cough; denies airway involvement     Current Medications:  Current Outpatient Medications:    aspirin EC 81 MG tablet, Take 1 tablet (81 mg total) by mouth daily., Disp: 90 tablet, Rfl: 3   Blood Glucose Monitoring Suppl (FREESTYLE LITE) DEVI, 1 each by Does not apply route  Patient ID: Stacey Roberts, female    DOB: 19-Nov-1965, 55 y.o.   MRN: [id number]  Chief Complaint  Patient presents with   Follow-up      ICD-10-CM   1. Status post radiation therapy  Z92.3     2. Postoperative breast asymmetry  N64.89     3. Breast asymmetry  N64.89     4. Type 2 diabetes mellitus with diabetic polyneuropathy, unspecified whether long term insulin use (HCC)  E11.42     5. History of radiation exposure  Z92.3        History of Present Illness: Stacey Roberts is a 55 y.o.  female  with a history of recent breast surgery.  She has a history of left breast cancer which was diagnosed in 2014 with subsequent radiation.  She developed left breast radiation necrosis and subsequently underwent left latissimus myocutaneous muscle flap with Dr. Marla Roe 07/19/2020.  She presents for preoperative evaluation for upcoming procedure, excision of back and left breast wound, scheduled for 09/29/2020 with Dr. Marla Roe.  The patient has not had problems with anesthesia. No history of DVT/PE.  No family history of DVT/PE.  No family or personal history of bleeding or clotting disorders.  Patient is not currently taking any blood thinners.  No history of CVA/MI.   Summary of Previous Visit: Patient underwent lumpectomy in 2014, subsequently had cluster calcifications and reexcision in 2017.  She had positive margins and underwent reexcision and decided on bilateral oncoplastic breast reductions.  She then had postoperative radiation.  She subsequently had tightness and increased pain in the left lateral breast which was thought to be fat necrosis/radiation necrosis which was excised on 07/19/2020 with Dr. Marla Roe.  She subsequently developed a left breast wound along the radiated tissue as well as a wound of her back incision.  PMH Significant for: History of left breast radiation, history of diabetes mellitus with most recent A1c 8.2 History of coronary artery disease, PCOS.   Reports she had a sleep apnea test when she first moved to the [place] area, reports she has never needed to use a CPAP and she is unsure why OSA is a diagnosis in her chart.  She reports no issues with sleeping other than some sleep changes that she attributes to aging.  Patient currently on insulin.  Currently on ASA 81 mg.  She is currently on tamoxifen, recommend holding at least 1 week prior to surgery and 2 weeks after surgery.  No recent changes in her health, no infectious symptoms.  Past Medical History: Allergies: Allergies  Allergen Reactions   Penicillins Shortness Of Breath and Rash    Has patient had a PCN reaction causing immediate rash, facial/tongue/throat swelling, SOB or lightheadedness with hypotension: Yes Has patient had a PCN reaction causing severe rash involving mucus membranes or skin necrosis: Yes Has patient had a PCN reaction that required hospitalization No Has patient had a PCN reaction occurring within the last 10 years: Yes If all of the above answers are "NO", then may proceed with Cephalosporin use.    Clindamycin/Lincomycin Other (See Comments)    Severe stomach cramps   Ace Inhibitors Cough    REACTION: cough; denies airway involvement     Current Medications:  Current Outpatient Medications:    aspirin EC 81 MG tablet, Take 1 tablet (81 mg total) by mouth daily., Disp: 90 tablet, Rfl: 3   Blood Glucose Monitoring Suppl (FREESTYLE LITE) DEVI, 1 each by Does not apply route  Patient ID: Stacey Roberts, female    DOB: 19-Nov-1965, 55 y.o.   MRN: [id number]  Chief Complaint  Patient presents with   Follow-up      ICD-10-CM   1. Status post radiation therapy  Z92.3     2. Postoperative breast asymmetry  N64.89     3. Breast asymmetry  N64.89     4. Type 2 diabetes mellitus with diabetic polyneuropathy, unspecified whether long term insulin use (HCC)  E11.42     5. History of radiation exposure  Z92.3        History of Present Illness: Stacey Roberts is a 55 y.o.  female  with a history of recent breast surgery.  She has a history of left breast cancer which was diagnosed in 2014 with subsequent radiation.  She developed left breast radiation necrosis and subsequently underwent left latissimus myocutaneous muscle flap with Dr. Marla Roe 07/19/2020.  She presents for preoperative evaluation for upcoming procedure, excision of back and left breast wound, scheduled for 09/29/2020 with Dr. Marla Roe.  The patient has not had problems with anesthesia. No history of DVT/PE.  No family history of DVT/PE.  No family or personal history of bleeding or clotting disorders.  Patient is not currently taking any blood thinners.  No history of CVA/MI.   Summary of Previous Visit: Patient underwent lumpectomy in 2014, subsequently had cluster calcifications and reexcision in 2017.  She had positive margins and underwent reexcision and decided on bilateral oncoplastic breast reductions.  She then had postoperative radiation.  She subsequently had tightness and increased pain in the left lateral breast which was thought to be fat necrosis/radiation necrosis which was excised on 07/19/2020 with Dr. Marla Roe.  She subsequently developed a left breast wound along the radiated tissue as well as a wound of her back incision.  PMH Significant for: History of left breast radiation, history of diabetes mellitus with most recent A1c 8.2 History of coronary artery disease, PCOS.   Reports she had a sleep apnea test when she first moved to the [place] area, reports she has never needed to use a CPAP and she is unsure why OSA is a diagnosis in her chart.  She reports no issues with sleeping other than some sleep changes that she attributes to aging.  Patient currently on insulin.  Currently on ASA 81 mg.  She is currently on tamoxifen, recommend holding at least 1 week prior to surgery and 2 weeks after surgery.  No recent changes in her health, no infectious symptoms.  Past Medical History: Allergies: Allergies  Allergen Reactions   Penicillins Shortness Of Breath and Rash    Has patient had a PCN reaction causing immediate rash, facial/tongue/throat swelling, SOB or lightheadedness with hypotension: Yes Has patient had a PCN reaction causing severe rash involving mucus membranes or skin necrosis: Yes Has patient had a PCN reaction that required hospitalization No Has patient had a PCN reaction occurring within the last 10 years: Yes If all of the above answers are "NO", then may proceed with Cephalosporin use.    Clindamycin/Lincomycin Other (See Comments)    Severe stomach cramps   Ace Inhibitors Cough    REACTION: cough; denies airway involvement     Current Medications:  Current Outpatient Medications:    aspirin EC 81 MG tablet, Take 1 tablet (81 mg total) by mouth daily., Disp: 90 tablet, Rfl: 3   Blood Glucose Monitoring Suppl (FREESTYLE LITE) DEVI, 1 each by Does not apply route

## 2020-09-09 NOTE — Progress Notes (Signed)
Patient ID: Stacey Roberts, female    DOB: 18-Mar-1965, 55 y.o.   MRN: [id number]  Chief Complaint  Patient presents with   Follow-up      ICD-10-CM   1. Status post radiation therapy  Z92.3     2. Postoperative breast asymmetry  N64.89     3. Breast asymmetry  N64.89     4. Type 2 diabetes mellitus with diabetic polyneuropathy, unspecified whether long term insulin use (HCC)  E11.42     5. History of radiation exposure  Z92.3        History of Present Illness: Stacey Roberts is a 55 y.o.  female  with a history of recent breast surgery.  She has a history of left breast cancer which was diagnosed in 2014 with subsequent radiation.  She developed left breast radiation necrosis and subsequently underwent left latissimus myocutaneous muscle flap with Dr. Marla Roe 07/19/2020.  She presents for preoperative evaluation for upcoming procedure, excision of back and left breast wound, scheduled for 09/29/2020 with Dr. Marla Roe.  The patient has not had problems with anesthesia. No history of DVT/PE.  No family history of DVT/PE.  No family or personal history of bleeding or clotting disorders.  Patient is not currently taking any blood thinners.  No history of CVA/MI.   Summary of Previous Visit: Patient underwent lumpectomy in 2014, subsequently had cluster calcifications and reexcision in 2017.  She had positive margins and underwent reexcision and decided on bilateral oncoplastic breast reductions.  She then had postoperative radiation.  She subsequently had tightness and increased pain in the left lateral breast which was thought to be fat necrosis/radiation necrosis which was excised on 07/19/2020 with Dr. Marla Roe.  She subsequently developed a left breast wound along the radiated tissue as well as a wound of her back incision.  PMH Significant for: History of left breast radiation, history of diabetes mellitus with most recent A1c 8.2 History of coronary artery disease, PCOS.   Reports she had a sleep apnea test when she first moved to the [place] area, reports she has never needed to use a CPAP and she is unsure why OSA is a diagnosis in her chart.  She reports no issues with sleeping other than some sleep changes that she attributes to aging.  Patient currently on insulin.  Currently on ASA 81 mg.  She is currently on tamoxifen, recommend holding at least 1 week prior to surgery and 2 weeks after surgery.  No recent changes in her health, no infectious symptoms.  Past Medical History: Allergies: Allergies  Allergen Reactions   Penicillins Shortness Of Breath and Rash    Has patient had a PCN reaction causing immediate rash, facial/tongue/throat swelling, SOB or lightheadedness with hypotension: Yes Has patient had a PCN reaction causing severe rash involving mucus membranes or skin necrosis: Yes Has patient had a PCN reaction that required hospitalization No Has patient had a PCN reaction occurring within the last 10 years: Yes If all of the above answers are "NO", then may proceed with Cephalosporin use.    Clindamycin/Lincomycin Other (See Comments)    Severe stomach cramps   Ace Inhibitors Cough    REACTION: cough; denies airway involvement     Current Medications:  Current Outpatient Medications:    aspirin EC 81 MG tablet, Take 1 tablet (81 mg total) by mouth daily., Disp: 90 tablet, Rfl: 3   Blood Glucose Monitoring Suppl (FREESTYLE LITE) DEVI, 1 each by Does not apply route  Patient ID: Stacey Roberts, female    DOB: 18-Mar-1965, 55 y.o.   MRN: [id number]  Chief Complaint  Patient presents with   Follow-up      ICD-10-CM   1. Status post radiation therapy  Z92.3     2. Postoperative breast asymmetry  N64.89     3. Breast asymmetry  N64.89     4. Type 2 diabetes mellitus with diabetic polyneuropathy, unspecified whether long term insulin use (HCC)  E11.42     5. History of radiation exposure  Z92.3        History of Present Illness: Stacey Roberts is a 55 y.o.  female  with a history of recent breast surgery.  She has a history of left breast cancer which was diagnosed in 2014 with subsequent radiation.  She developed left breast radiation necrosis and subsequently underwent left latissimus myocutaneous muscle flap with Dr. Marla Roe 07/19/2020.  She presents for preoperative evaluation for upcoming procedure, excision of back and left breast wound, scheduled for 09/29/2020 with Dr. Marla Roe.  The patient has not had problems with anesthesia. No history of DVT/PE.  No family history of DVT/PE.  No family or personal history of bleeding or clotting disorders.  Patient is not currently taking any blood thinners.  No history of CVA/MI.   Summary of Previous Visit: Patient underwent lumpectomy in 2014, subsequently had cluster calcifications and reexcision in 2017.  She had positive margins and underwent reexcision and decided on bilateral oncoplastic breast reductions.  She then had postoperative radiation.  She subsequently had tightness and increased pain in the left lateral breast which was thought to be fat necrosis/radiation necrosis which was excised on 07/19/2020 with Dr. Marla Roe.  She subsequently developed a left breast wound along the radiated tissue as well as a wound of her back incision.  PMH Significant for: History of left breast radiation, history of diabetes mellitus with most recent A1c 8.2 History of coronary artery disease, PCOS.   Reports she had a sleep apnea test when she first moved to the [place] area, reports she has never needed to use a CPAP and she is unsure why OSA is a diagnosis in her chart.  She reports no issues with sleeping other than some sleep changes that she attributes to aging.  Patient currently on insulin.  Currently on ASA 81 mg.  She is currently on tamoxifen, recommend holding at least 1 week prior to surgery and 2 weeks after surgery.  No recent changes in her health, no infectious symptoms.  Past Medical History: Allergies: Allergies  Allergen Reactions   Penicillins Shortness Of Breath and Rash    Has patient had a PCN reaction causing immediate rash, facial/tongue/throat swelling, SOB or lightheadedness with hypotension: Yes Has patient had a PCN reaction causing severe rash involving mucus membranes or skin necrosis: Yes Has patient had a PCN reaction that required hospitalization No Has patient had a PCN reaction occurring within the last 10 years: Yes If all of the above answers are "NO", then may proceed with Cephalosporin use.    Clindamycin/Lincomycin Other (See Comments)    Severe stomach cramps   Ace Inhibitors Cough    REACTION: cough; denies airway involvement     Current Medications:  Current Outpatient Medications:    aspirin EC 81 MG tablet, Take 1 tablet (81 mg total) by mouth daily., Disp: 90 tablet, Rfl: 3   Blood Glucose Monitoring Suppl (FREESTYLE LITE) DEVI, 1 each by Does not apply route  Patient ID: Stacey Roberts, female    DOB: 18-Mar-1965, 55 y.o.   MRN: [id number]  Chief Complaint  Patient presents with   Follow-up      ICD-10-CM   1. Status post radiation therapy  Z92.3     2. Postoperative breast asymmetry  N64.89     3. Breast asymmetry  N64.89     4. Type 2 diabetes mellitus with diabetic polyneuropathy, unspecified whether long term insulin use (HCC)  E11.42     5. History of radiation exposure  Z92.3        History of Present Illness: Stacey Roberts is a 55 y.o.  female  with a history of recent breast surgery.  She has a history of left breast cancer which was diagnosed in 2014 with subsequent radiation.  She developed left breast radiation necrosis and subsequently underwent left latissimus myocutaneous muscle flap with Dr. Marla Roe 07/19/2020.  She presents for preoperative evaluation for upcoming procedure, excision of back and left breast wound, scheduled for 09/29/2020 with Dr. Marla Roe.  The patient has not had problems with anesthesia. No history of DVT/PE.  No family history of DVT/PE.  No family or personal history of bleeding or clotting disorders.  Patient is not currently taking any blood thinners.  No history of CVA/MI.   Summary of Previous Visit: Patient underwent lumpectomy in 2014, subsequently had cluster calcifications and reexcision in 2017.  She had positive margins and underwent reexcision and decided on bilateral oncoplastic breast reductions.  She then had postoperative radiation.  She subsequently had tightness and increased pain in the left lateral breast which was thought to be fat necrosis/radiation necrosis which was excised on 07/19/2020 with Dr. Marla Roe.  She subsequently developed a left breast wound along the radiated tissue as well as a wound of her back incision.  PMH Significant for: History of left breast radiation, history of diabetes mellitus with most recent A1c 8.2 History of coronary artery disease, PCOS.   Reports she had a sleep apnea test when she first moved to the [place] area, reports she has never needed to use a CPAP and she is unsure why OSA is a diagnosis in her chart.  She reports no issues with sleeping other than some sleep changes that she attributes to aging.  Patient currently on insulin.  Currently on ASA 81 mg.  She is currently on tamoxifen, recommend holding at least 1 week prior to surgery and 2 weeks after surgery.  No recent changes in her health, no infectious symptoms.  Past Medical History: Allergies: Allergies  Allergen Reactions   Penicillins Shortness Of Breath and Rash    Has patient had a PCN reaction causing immediate rash, facial/tongue/throat swelling, SOB or lightheadedness with hypotension: Yes Has patient had a PCN reaction causing severe rash involving mucus membranes or skin necrosis: Yes Has patient had a PCN reaction that required hospitalization No Has patient had a PCN reaction occurring within the last 10 years: Yes If all of the above answers are "NO", then may proceed with Cephalosporin use.    Clindamycin/Lincomycin Other (See Comments)    Severe stomach cramps   Ace Inhibitors Cough    REACTION: cough; denies airway involvement     Current Medications:  Current Outpatient Medications:    aspirin EC 81 MG tablet, Take 1 tablet (81 mg total) by mouth daily., Disp: 90 tablet, Rfl: 3   Blood Glucose Monitoring Suppl (FREESTYLE LITE) DEVI, 1 each by Does not apply route  Patient ID: Stacey Roberts, female    DOB: 18-Mar-1965, 55 y.o.   MRN: [id number]  Chief Complaint  Patient presents with   Follow-up      ICD-10-CM   1. Status post radiation therapy  Z92.3     2. Postoperative breast asymmetry  N64.89     3. Breast asymmetry  N64.89     4. Type 2 diabetes mellitus with diabetic polyneuropathy, unspecified whether long term insulin use (HCC)  E11.42     5. History of radiation exposure  Z92.3        History of Present Illness: Stacey Roberts is a 55 y.o.  female  with a history of recent breast surgery.  She has a history of left breast cancer which was diagnosed in 2014 with subsequent radiation.  She developed left breast radiation necrosis and subsequently underwent left latissimus myocutaneous muscle flap with Dr. Marla Roe 07/19/2020.  She presents for preoperative evaluation for upcoming procedure, excision of back and left breast wound, scheduled for 09/29/2020 with Dr. Marla Roe.  The patient has not had problems with anesthesia. No history of DVT/PE.  No family history of DVT/PE.  No family or personal history of bleeding or clotting disorders.  Patient is not currently taking any blood thinners.  No history of CVA/MI.   Summary of Previous Visit: Patient underwent lumpectomy in 2014, subsequently had cluster calcifications and reexcision in 2017.  She had positive margins and underwent reexcision and decided on bilateral oncoplastic breast reductions.  She then had postoperative radiation.  She subsequently had tightness and increased pain in the left lateral breast which was thought to be fat necrosis/radiation necrosis which was excised on 07/19/2020 with Dr. Marla Roe.  She subsequently developed a left breast wound along the radiated tissue as well as a wound of her back incision.  PMH Significant for: History of left breast radiation, history of diabetes mellitus with most recent A1c 8.2 History of coronary artery disease, PCOS.   Reports she had a sleep apnea test when she first moved to the [place] area, reports she has never needed to use a CPAP and she is unsure why OSA is a diagnosis in her chart.  She reports no issues with sleeping other than some sleep changes that she attributes to aging.  Patient currently on insulin.  Currently on ASA 81 mg.  She is currently on tamoxifen, recommend holding at least 1 week prior to surgery and 2 weeks after surgery.  No recent changes in her health, no infectious symptoms.  Past Medical History: Allergies: Allergies  Allergen Reactions   Penicillins Shortness Of Breath and Rash    Has patient had a PCN reaction causing immediate rash, facial/tongue/throat swelling, SOB or lightheadedness with hypotension: Yes Has patient had a PCN reaction causing severe rash involving mucus membranes or skin necrosis: Yes Has patient had a PCN reaction that required hospitalization No Has patient had a PCN reaction occurring within the last 10 years: Yes If all of the above answers are "NO", then may proceed with Cephalosporin use.    Clindamycin/Lincomycin Other (See Comments)    Severe stomach cramps   Ace Inhibitors Cough    REACTION: cough; denies airway involvement     Current Medications:  Current Outpatient Medications:    aspirin EC 81 MG tablet, Take 1 tablet (81 mg total) by mouth daily., Disp: 90 tablet, Rfl: 3   Blood Glucose Monitoring Suppl (FREESTYLE LITE) DEVI, 1 each by Does not apply route  Patient ID: Stacey Roberts, female    DOB: 18-Mar-1965, 55 y.o.   MRN: [id number]  Chief Complaint  Patient presents with   Follow-up      ICD-10-CM   1. Status post radiation therapy  Z92.3     2. Postoperative breast asymmetry  N64.89     3. Breast asymmetry  N64.89     4. Type 2 diabetes mellitus with diabetic polyneuropathy, unspecified whether long term insulin use (HCC)  E11.42     5. History of radiation exposure  Z92.3        History of Present Illness: Stacey Roberts is a 55 y.o.  female  with a history of recent breast surgery.  She has a history of left breast cancer which was diagnosed in 2014 with subsequent radiation.  She developed left breast radiation necrosis and subsequently underwent left latissimus myocutaneous muscle flap with Dr. Marla Roe 07/19/2020.  She presents for preoperative evaluation for upcoming procedure, excision of back and left breast wound, scheduled for 09/29/2020 with Dr. Marla Roe.  The patient has not had problems with anesthesia. No history of DVT/PE.  No family history of DVT/PE.  No family or personal history of bleeding or clotting disorders.  Patient is not currently taking any blood thinners.  No history of CVA/MI.   Summary of Previous Visit: Patient underwent lumpectomy in 2014, subsequently had cluster calcifications and reexcision in 2017.  She had positive margins and underwent reexcision and decided on bilateral oncoplastic breast reductions.  She then had postoperative radiation.  She subsequently had tightness and increased pain in the left lateral breast which was thought to be fat necrosis/radiation necrosis which was excised on 07/19/2020 with Dr. Marla Roe.  She subsequently developed a left breast wound along the radiated tissue as well as a wound of her back incision.  PMH Significant for: History of left breast radiation, history of diabetes mellitus with most recent A1c 8.2 History of coronary artery disease, PCOS.   Reports she had a sleep apnea test when she first moved to the [place] area, reports she has never needed to use a CPAP and she is unsure why OSA is a diagnosis in her chart.  She reports no issues with sleeping other than some sleep changes that she attributes to aging.  Patient currently on insulin.  Currently on ASA 81 mg.  She is currently on tamoxifen, recommend holding at least 1 week prior to surgery and 2 weeks after surgery.  No recent changes in her health, no infectious symptoms.  Past Medical History: Allergies: Allergies  Allergen Reactions   Penicillins Shortness Of Breath and Rash    Has patient had a PCN reaction causing immediate rash, facial/tongue/throat swelling, SOB or lightheadedness with hypotension: Yes Has patient had a PCN reaction causing severe rash involving mucus membranes or skin necrosis: Yes Has patient had a PCN reaction that required hospitalization No Has patient had a PCN reaction occurring within the last 10 years: Yes If all of the above answers are "NO", then may proceed with Cephalosporin use.    Clindamycin/Lincomycin Other (See Comments)    Severe stomach cramps   Ace Inhibitors Cough    REACTION: cough; denies airway involvement     Current Medications:  Current Outpatient Medications:    aspirin EC 81 MG tablet, Take 1 tablet (81 mg total) by mouth daily., Disp: 90 tablet, Rfl: 3   Blood Glucose Monitoring Suppl (FREESTYLE LITE) DEVI, 1 each by Does not apply route

## 2020-09-10 ENCOUNTER — Encounter: Payer: Self-pay | Admitting: Surgical

## 2020-09-10 ENCOUNTER — Ambulatory Visit (INDEPENDENT_AMBULATORY_CARE_PROVIDER_SITE_OTHER): Payer: No Typology Code available for payment source | Admitting: Surgical

## 2020-09-10 ENCOUNTER — Other Ambulatory Visit: Payer: Self-pay

## 2020-09-10 DIAGNOSIS — N6489 Other specified disorders of breast: Secondary | ICD-10-CM

## 2020-09-10 DIAGNOSIS — Z923 Personal history of irradiation: Secondary | ICD-10-CM

## 2020-09-10 DIAGNOSIS — E1142 Type 2 diabetes mellitus with diabetic polyneuropathy: Secondary | ICD-10-CM

## 2020-09-10 MED ORDER — HYDROCODONE-ACETAMINOPHEN 5-325 MG PO TABS
1.0000 | ORAL_TABLET | Freq: Four times a day (QID) | ORAL | 0 refills | Status: AC | PRN
  Filled 2020-09-10: qty 20, 5d supply, fill #0

## 2020-09-10 MED ORDER — ONDANSETRON HCL 4 MG PO TABS
4.0000 mg | ORAL_TABLET | Freq: Three times a day (TID) | ORAL | 0 refills | Status: DC | PRN
  Filled 2020-09-10: qty 20, 7d supply, fill #0

## 2020-09-10 MED ORDER — CIPROFLOXACIN HCL 500 MG PO TABS
500.0000 mg | ORAL_TABLET | Freq: Two times a day (BID) | ORAL | 0 refills | Status: AC
  Filled 2020-09-10: qty 10, 5d supply, fill #0

## 2020-09-15 ENCOUNTER — Other Ambulatory Visit: Payer: Self-pay

## 2020-09-15 ENCOUNTER — Other Ambulatory Visit: Payer: Self-pay | Admitting: *Deleted

## 2020-09-15 DIAGNOSIS — C50112 Malignant neoplasm of central portion of left female breast: Secondary | ICD-10-CM

## 2020-09-15 DIAGNOSIS — Z170 Estrogen receptor positive status [ER+]: Secondary | ICD-10-CM

## 2020-09-15 NOTE — Progress Notes (Signed)
[facility]  [phone] [phone]     ID: Stacey Roberts DOB: 06-20-1965  MR#: [id number]  LOV#:[id number]  Patient Care Team: Stacey Munroe, NP as PCP - General (Internal Medicine) End, Stacey Deer, MD as PCP - Cardiology (Cardiology) Stacey Roberts, Stacey Hue, MD as Consulting Physician (Oncology) Stacey Miyamoto, MD as Consulting Physician (General Surgery) Stacey Rankins, MD as Consulting Physician (Obstetrics and Gynecology) Stacey Blackbird, MD as Consulting Physician (Radiation Oncology) Stacey Belling, MD as Consulting Physician (Endocrinology) Stacey Fredrickson, MD as Consulting Physician (Gastroenterology) Stacey Fellows, MD as Consulting Physician (Plastic Surgery) Roberts, Stacey Bills, DO as Attending Physician (Plastic Surgery) OTHER MD:   CHIEF COMPLAINT: Estrogen receptor positive invasive breast cancer  CURRENT TREATMENT: Tamoxifen   INTERVAL HISTORY: Stacey Roberts returns today for follow-up of her estrogen receptor positive breast cancer.  She continues on tamoxifen. She tolerates this generally well but is looking forward to completing her 5 years January 2023.  Since her last visit, she underwent left breast radiation necrosis excision on 07/19/2020 and a flap reconstruction under Dr. Ulice Roberts. Pathology from the procedure [phone]) showed: fat necrosis with dystrophic calcifications and extensive fibrosis.   REVIEW OF SYSTEMS: Stacey Roberts tells me she was able to get her A1c down to less than 8 for her surgery but she has not been able to exercise since because of dehiscence and other issues so it is now again a little over 8 (still it is better than 10 which is where it was initially).  She does have areas of dehiscence anteriorly and posteriorly on her wounds and these are being followed closely by StaceyDillingham.  The good news is that the pain Stacey Roberts was having is gone.  There was a pulling discomfort previously which resolved with the  reconstructive surgery.  A detailed review of systems today was otherwise stable.   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 She had left lumpectomy for that lesion 01/06/2013, showing a low-grade noninvasive ductal carcinoma which was estrogen receptor 99% and progesterone receptor 100% positive. Margins were focally positive and she had further excision 01/22/2013 which showed residual focal ductal carcinoma in situ 1 mm from the posterior margin. The patient refused adjuvant radiation and did not tolerate tamoxifen, which she took for approximately 2 months. She was last seen in our office in 2015.  Because of that history she receives diagnostic bilateral mammography yearly. Her 02/15/2015 study found the breast density to be category A. This study was negative. However in early August 2017 she developed a new left nipple discharge, which at times was bloody. Repeat left mammography with tomography and left breast ultrasonography 08/30/2015 now read the breast density to be category B. Aside some medial left breast masses and postlumpectomy findings, none of which showed any change since 2012, there were new pleomorphic calcifications posterior to the lumpectomy area 1.4 cm. On exam there was no palpable lump. Ultrasound showed scarring behind the nipple which was unchanged from prior, but no definite mass.  Biopsy of the left breast upper outer quadrant area of calcifications 09/02/2015 showed ([id number]) ductal carcinoma in situ, grade 1. Focal invasion could not be excluded. There was not enough tissue for a prognostic panel.  On 09/10/2015 the patient underwent bilateral breast MRI with and without contrast. This again found the breast  density to be category B. There was an area of non-masslike enhancement  involving the upper outer quadrant of the left breast from the nipple posteriorly extending approximately 10 cm. The right breast was unremarkable. There was no evidence of adenopathy. MRI guided biopsy of the upper-outer quadrant retroareolar area of the left breast 09/16/2015 showed fat necrosis but no evidence of malignancy ([id number]).  Stacey Roberts's subsequent history is as detailed below   PAST MEDICAL HISTORY: Past Medical History:  Diagnosis Date   Allergy    seasonal   BRCA negative 2017   Breast cancer (HCC) 2014   Breast cancer (HCC) 2017   Bronchitis    hx of   Coronary artery disease 06/2019   80% mid RCA stenosis - medical management   Diabetes mellitus, type II (HCC)    Endometrial hyperplasia 2014   Mirena placed; Dr. 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 Sharl Roberts    PAST SURGICAL HISTORY: Past Surgical History:  Procedure Laterality Date   BREAST BIOPSY  2000   s/p   BREAST BIOPSY Left 01/22/2013   Procedure: RE-EXCISION LEFT BREAST DCIS;  Surgeon: Stacey Rubenstein, MD;  Location: [facility];  Service: General;  Laterality: Left;   BREAST LUMPECTOMY Left 01/06/2013   Procedure: LUMPECTOMY;  Surgeon: Stacey Rubenstein, MD;  Location: [facility];  Service: General;  Laterality: Left;   BREAST LUMPECTOMY Left 2017   BREAST LUMPECTOMY WITH NEEDLE LOCALIZATION AND AXILLARY SENTINEL LYMPH NODE BX Left  09/30/2015   Procedure: Left BREAST LUMPECTOMY WITH NEEDLE LOCALIZATION AND AXILLARY SENTINEL LYMPH NODE BX;  Surgeon: Stacey Miyamoto, MD;  Location: [facility];  Service: General;  Laterality: Left;   BREAST RECONSTRUCTION Bilateral 10/12/2015   Procedure: BREAST oncoplastic RECONSTRUCTION;  Surgeon: Stacey Fellows, MD;  Location: [facility];  Service: Plastics;  Laterality: Bilateral;   BREAST REDUCTION SURGERY Bilateral 10/12/2015   Procedure: MAMMARY REDUCTION  (BREAST);  Surgeon: Stacey Fellows, MD;  Location: [facility];  Service: Plastics;  Laterality: Bilateral;   CORONARY ANGIOPLASTY     DILATION AND CURETTAGE OF UTERUS  2004   s/p   EXCISION OF BREAST LESION Left 10/12/2015   Procedure: Re-EXCISION OF BREAST;  Surgeon: Stacey Miyamoto, MD;  Location: [facility];  Service: General;  Laterality: Left;   LATISSIMUS FLAP TO BREAST Left 07/19/2020   Procedure: Left breast radiation necrosis excision with latissimus muscle flap;  Surgeon: Peggye Form, DO;  Location: [facility];  Service: Plastics;  Laterality: Left;  3 hours   LEFT HEART CATH AND CORONARY ANGIOGRAPHY N/A 06/27/2019   Procedure: LEFT HEART CATH AND CORONARY ANGIOGRAPHY;  Surgeon: Yvonne Kendall, MD;  Location: [facility];  Service: Cardiovascular;  Laterality: N/A;   REDUCTION MAMMAPLASTY Bilateral 09/2015   THYROIDECTOMY  02/2009   Dr Gerrit Friends   TONSILLECTOMY  1982   TONSILLECTOMY      FAMILY HISTORY Family History  Problem Relation Age of Onset   Dementia Father        deceased age 50 secondary to dementia   Bipolar disorder Father    Emphysema Father    Heart disease Father    COPD Father        smoker   Hypertension Father    Depression Father    Alcoholism Father    CAD Mother 27       Died age 24 of CAD   Heart disease Mother        CABG x 2   Graves' disease Mother    Hypertension Mother    Thyroid disease Mother    Heart attack Mother 11    CAD Maternal Grandfather 26   Heart disease Maternal Grandfather    Heart attack Maternal Grandfather 34   Other Sister 27       hysterectomy for fibroids   Prostate cancer Brother        low grade; w/ surveillance   Thyroid nodules Brother 7   Heart disease Brother 73       CABG x 4   Heart disease Sister        CABG x 2   Heart attack Sister 22   Fibroids Other        niece dx approx 78   Hypertension Other    Kidney cancer Cousin        maternal 1st cousin dx 3-47; former smoker   Lung cancer Other        maternal great aunt (MGM's sister); not a smoker   Cancer Other        nephew dx neuroblastoma at 64.67 years old   Colon cancer Neg Hx    Colon polyps Neg Hx   The patient's father died at the age of 64 from myocardial infarction in the setting of dementia. The patient's mother died at the age of 69 from heart disease. Brayah has 2 brothers, 2 sisters. There is no history of breast ovarian or colon cancer in the family. Of course she has a history of thyroid cancer   GYNECOLOGIC HISTORY:  Patient's last menstrual period was 12/23/2012. Menarche age 70, she is GX P0. She had a Mirena in place multiple years, removed February 2022.   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 She is divorced.  She shares a condo with her sister.  There is a local gym available.  The patient is not a church attender.    ADVANCED DIRECTIVES: the patient has named her sister Aram Beecham as her healthcare power of attorney   HEALTH MAINTENANCE: Social History   Tobacco Use   Smoking status: Former    Packs/day: 0.50    Types: Cigarettes    Quit date: 09/29/2007    Years since quitting: 12.9   Smokeless tobacco: Never   Tobacco comments:    Smoked on and off for 20 years. Quit about 8 years ago (as of 08/2015)  Vaping Use   Vaping Use: Never used  Substance Use Topics   Alcohol use: Not Currently    Alcohol/week: 0.0 standard drinks   Drug use: No      Colonoscopy:  PAP:  Bone density:   Allergies  Allergen Reactions   Penicillins Shortness Of Breath and Rash    Has patient had  a PCN reaction causing immediate rash, facial/tongue/throat swelling, SOB or lightheadedness with hypotension: Yes Has patient had a PCN reaction causing severe rash involving mucus membranes or skin necrosis: Yes Has patient had a PCN reaction that required hospitalization No Has patient had a PCN reaction occurring within the last 10 years: Yes If all of the above answers are "NO", then may proceed with Cephalosporin use.    Clindamycin/Lincomycin Other (See Comments)    Severe stomach cramps   Ace Inhibitors Cough    REACTION: cough; denies airway involvement     Current Outpatient Medications  Medication Sig Dispense Refill   aspirin EC 81 MG tablet Take 1 tablet (81 mg total) by mouth daily. 90 tablet 3   Blood Glucose Monitoring Suppl (FREESTYLE LITE) DEVI 1 each by Does not apply route 2 (two) times daily. [name] 1 each 0   dapagliflozin propanediol (FARXIGA) 5 MG TABS tablet TAKE 1 TABLET BY MOUTH DAILY. (Patient taking differently: Take 5 mg by mouth in the morning.) 90 tablet 3   gabapentin (NEURONTIN) 300 MG capsule Take 2 capsules (600 mg total) by mouth at bedtime. (Patient taking differently: Take 600 mg by mouth at bedtime as needed (pain).) 180 capsule 4   glucose blood (FREESTYLE LITE) test strip 1 each by Other route 2 (two) times daily. [name] 200 each 0   insulin glargine-yfgn (SEMGLEE) 100 UNIT/ML Pen Inject 45 Units into the skin every morning. 45 mL 3   Insulin Pen Needle (UNIFINE PENTIPS) 31G X 8 MM MISC use to inject twice a day 100 each 3   Lancets (FREESTYLE) lancets 1 each by Other route 2 (two) times daily. [name] 200 each 0   levothyroxine (SYNTHROID) 137 MCG tablet TAKE 1 TABLET (137 MCG TOTAL) BY MOUTH DAILY BEFORE BREAKFAST. (Patient taking differently: Take 137 mcg by mouth daily before breakfast.) 90 tablet 3   losartan  (COZAAR) 50 MG tablet TAKE 1 TABLET (50 MG TOTAL) BY MOUTH DAILY. (Patient taking differently: Take 50 mg by mouth in the morning.) 90 tablet 2   metFORMIN (GLUCOPHAGE-XR) 500 MG 24 hr tablet TAKE 4 TABLETS BY MOUTH DAILY WITH BREAKFAST 360 tablet 3   metoprolol tartrate (LOPRESSOR) 50 MG tablet TAKE 1 TABLET BY MOUTH TWO TIMES DAILY (Patient taking differently: Take 50 mg by mouth 2 (two) times daily.) 180 tablet 1   Multiple Vitamins-Minerals (MULTIVITAMIN WITH MINERALS) tablet Take 1 tablet by mouth daily. Woman's Gummies     nitroGLYCERIN (NITROSTAT) 0.4 MG SL tablet Place 1 tablet (0.4 mg total) under the tongue every 5 (five) minutes as needed for chest pain. 25 tablet prn   ondansetron (ZOFRAN) 4 MG tablet Take 1 tablet (4 mg total) by mouth every 8 (eight) hours as needed for nausea or vomiting. 20 tablet 0   pantoprazole (PROTONIX) 40 MG tablet TAKE 1 TABLET BY MOUTH DAILY. 90 tablet 0   pregabalin (LYRICA) 75 MG capsule TAKE 1 CAPSULE (75 MG TOTAL) BY MOUTH 2 (TWO) TIMES DAILY. (Patient taking differently: Take 75 mg by mouth in the morning.) 180 capsule 3   rosuvastatin (CRESTOR) 20 MG tablet TAKE 1 TABLET BY MOUTH DAILY. (Patient taking differently: Take 20 mg by mouth in the morning.) 90 tablet 3   Semaglutide, 1 MG/DOSE, 4 MG/3ML SOPN Inject 1 mg as directed once a week. 9 mL 3   tamoxifen (NOLVADEX) 20 MG tablet TAKE 1 TABLET BY MOUTH DAILY. (Patient taking differently: Take 20 mg by mouth in the morning.) 90 tablet 3  No current facility-administered medications for this visit.    OBJECTIVE: white woman who appears stated age  Vitals:   09/16/20 0824  BP: 110/84  Pulse: 84  Resp: 18  Temp: 97.8 F (36.6 C)  SpO2: 98%      Body mass index is 37.95 kg/m.    ECOG FS:1 - Symptomatic but completely ambulatory  Sclerae unicteric, EOMs intact Wearing a mask No cervical or supraclavicular adenopathy Lungs no rales or rhonchi Heart regular rate and rhythm Abd soft, obese,  nontender, positive bowel sounds MSK no focal spinal tenderness, no upper extremity lymphedema Neuro: nonfocal, well oriented, appropriate affect Breasts: The right breast is unremarkable.  The left breast is status post lumpectomy and latissimus reconstruction.  There is an area of dehiscence imaged below at the confluence of the scars anteriorly.  The area on the back was not imaged.  Both axillae are benign.  Left breast 08/28/2019   Left breast 09/16/2020   LAB RESULTS:  CMP     Component Value Date/Time   NA 139 09/16/2020 0813   NA 138 03/21/2017 1018   NA 137 02/18/2013 1554   K 4.2 09/16/2020 0813   K 4.4 02/18/2013 1554   CL 105 09/16/2020 0813   CO2 25 09/16/2020 0813   CO2 25 02/18/2013 1554   GLUCOSE 225 (H) 09/16/2020 0813   GLUCOSE 300 (H) 02/18/2013 1554   BUN 17 09/16/2020 0813   BUN 16 03/21/2017 1018   BUN 14.2 02/18/2013 1554   CREATININE 0.75 09/16/2020 0813   CREATININE 0.8 02/18/2013 1554   CALCIUM 8.8 (L) 09/16/2020 0813   CALCIUM 9.2 02/18/2013 1554   PROT 6.5 09/16/2020 0813   PROT 6.5 03/21/2017 1018   PROT 7.2 02/18/2013 1554   ALBUMIN 3.2 (L) 09/16/2020 0813   ALBUMIN 3.7 03/21/2017 1018   ALBUMIN 3.6 02/18/2013 1554   AST 12 (L) 09/16/2020 0813   AST 10 02/18/2013 1554   ALT 14 09/16/2020 0813   ALT 16 02/18/2013 1554   ALKPHOS 76 09/16/2020 0813   ALKPHOS 85 02/18/2013 1554   BILITOT 0.4 09/16/2020 0813   BILITOT 0.33 02/18/2013 1554   GFRNONAA >60 09/16/2020 0813   GFRAA >60 08/28/2019 0852   GFRAA >60 07/16/2017 1442    INo results found for: SPEP, UPEP  Lab Results  Component Value Date   WBC 7.9 09/16/2020   NEUTROABS 5.6 09/16/2020   HGB 12.8 09/16/2020   HCT 40.7 09/16/2020   MCV 84.8 09/16/2020   PLT 230 09/16/2020      Chemistry      Component Value Date/Time   NA 139 09/16/2020 0813   NA 138 03/21/2017 1018   NA 137 02/18/2013 1554   K 4.2 09/16/2020 0813   K 4.4 02/18/2013 1554   CL 105 09/16/2020 0813    CO2 25 09/16/2020 0813   CO2 25 02/18/2013 1554   BUN 17 09/16/2020 0813   BUN 16 03/21/2017 1018   BUN 14.2 02/18/2013 1554   CREATININE 0.75 09/16/2020 0813   CREATININE 0.8 02/18/2013 1554      Component Value Date/Time   CALCIUM 8.8 (L) 09/16/2020 0813   CALCIUM 9.2 02/18/2013 1554   ALKPHOS 76 09/16/2020 0813   ALKPHOS 85 02/18/2013 1554   AST 12 (L) 09/16/2020 0813   AST 10 02/18/2013 1554   ALT 14 09/16/2020 0813   ALT 16 02/18/2013 1554   BILITOT 0.4 09/16/2020 0813   BILITOT 0.33 02/18/2013 1554  No results found for: LABCA2  No components found for: YQMVH846  No results for input(s): INR in the last 168 hours.  Urinalysis    Component Value Date/Time   COLORURINE YELLOW 03/15/2009 0835   APPEARANCEUR CLOUDY (A) 03/15/2009 0835   LABSPEC 1.024 03/15/2009 0835   PHURINE 7.5 03/15/2009 0835   GLUCOSEU NEGATIVE 03/15/2009 0835   GLUCOSEU NEGATIVE 01/09/2008 1524   HGBUR NEGATIVE 03/15/2009 0835   BILIRUBINUR NEGATIVE 03/15/2009 0835   KETONESUR NEGATIVE 03/15/2009 0835   PROTEINUR NEGATIVE 03/15/2009 0835   UROBILINOGEN 1.0 03/15/2009 0835   NITRITE NEGATIVE 03/15/2009 0835   LEUKOCYTESUR TRACE (A) 03/15/2009 0835    STUDIES: No results found.   ELIGIBLE FOR AVAILABLE RESEARCH PROTOCOL: No   ASSESSMENT: 55 y.o. BRCA negative [place], [place] woman   (1) status post central left breast lumpectomy 01/06/2013 for a 1.5 cm ductal carcinoma in situ, grade 1, estrogen receptor 99% positive, progesterone receptor 100% positive, with a focally positive margin   (2) status post additional surgery for margin clearance 01/22/2013 showed the posterior margin negative at 0.1 cm  (3) the patient decided against adjuvant radiation; she was supposed to have started tamoxifen May 2015,  (4) left breast upper outer quadrant biopsy 09/02/2015 finds ductal carcinoma in situ, grade 1, with not enough tissue for prognostic panel  (a) upper outer quadrant biopsy  retroareolar biopsy 09/16/2015, found to fibroadipose tissue and fat necrosis but no evidence of malignancy  (5) genetics testing 09/21/2015 through the [facility] Common Hereditary Cancers Panel (Breast, Gyn, GI) performed by [facility] [place], [place]) found no deleterious mutations in APC, ATM, AXIN2, BARD1, BMPR1A, BRCA1, BRCA2, BRIP1, CDH1, CDKN2A, CHEK2, DICER1, EPCAM, GREM1, KIT, MEN1, MLH1, MSH2, MSH6, MUTYH, NBN, NF1, PALB2, PDGFRA, PMS2, POLD1, POLE, PTEN, RAD50, RAD51C, RAD51D, SDHA, SDHB, SDHC, SDHD, SMAD4, SMARCA4, STK11, TP53, TSC1, TSC2, and VHL  (6) repeat left lumpectomy and sentinel node biopsyy 10/12/2015 documented a pT2  pN0, stage IIA  Invasive ductal carcinoma, grade 1, with a focally positive margin; estrogen and progesterone receptor positive, HER-2 nonamplified, with an MIB-1 of 3%.  (a)  Left breast reexcision with bilateral oncoplastic reconstruction 10/12/2015 achieved clear margins, with no evidence of malignancy  (7) Oncotype DX score of 3 predicts a risk of recurrence outside the breast in 10 years of 4% if the patient's only systemic therapy is tamoxifen for 5 years. It also predicts no benefit from chemotherapy.  (8) adjuvant radiation11/08/17-12/19/17  Site/dose:  1) Left breast DIBH / 50.4 Gy in 28 fractions                         2) Left breast boost / 12 Gy in 6 fractions  (8) tamoxifen started 03/06/2016, completing 5 years January 2023  (a) Mirena IUD removed February 2022  (9) status post total thyroidectomy 03/18/2018 for a follicular variant of papillary thyroid carcinoma, measuring 1.7 cm, confined within thyroid parenchyma but with angiolymphatic invasion and capsular invasion identified, pT1b pNX  (a) note normal PTEN above  (10) left breast reconstruction:  (A) status post latissimus flap to the left breast with excision of necrosis 07/19/2020   PLAN: Stacey Roberts is now just about 5 years out from definitive surgery for her breast cancer with  no evidence of disease recurrence.  This is very favorable.  She has tolerated tamoxifen well.  The plan is to continue that through January 2023 and then stop.  She knows to hold tamoxifen for 2 weeks  before any surgery and to resume 1 week after when she is completely ambulatory.  She has additional surgery planned by Dr. Drake Leach him she says and will follow that protocol.  She is overdue for mammography but given the current status of her left breast were going to wait until March.  I did write for her screening mammogram to be done then.  She will see is 1 last time in April 2023 and assuming no complications she will "graduate" at that visit.  Total encounter time 25 minutes.*   Shaday Rayborn, Stacey Hue, MD  09/16/20 8:59 AM Medical Oncology and Hematology [facility] [address] [place], [place] [address] Tel. [phone]    Fax. [phone]   I, Mickie Bail, am acting as scribe for Dr. Valentino Roberts. Brittanny Levenhagen.  I, Ruthann Cancer MD, have reviewed the above documentation for accuracy and completeness, and I agree with the above.   *Total Encounter Time as defined by the Centers for Medicare and Medicaid Services includes, in addition to the face-to-face time of a patient visit (documented in the note above) non-face-to-face time: obtaining and reviewing outside history, ordering and reviewing medications, tests or procedures, care coordination (communications with other health care professionals or caregivers) and documentation in the medical record.

## 2020-09-16 ENCOUNTER — Inpatient Hospital Stay: Payer: No Typology Code available for payment source | Attending: Oncology | Admitting: Oncology

## 2020-09-16 ENCOUNTER — Other Ambulatory Visit: Payer: Self-pay

## 2020-09-16 ENCOUNTER — Inpatient Hospital Stay: Payer: No Typology Code available for payment source

## 2020-09-16 VITALS — BP 110/84 | HR 84 | Temp 97.8°F | Resp 18 | Ht 64.0 in | Wt 221.1 lb

## 2020-09-16 DIAGNOSIS — Z170 Estrogen receptor positive status [ER+]: Secondary | ICD-10-CM

## 2020-09-16 DIAGNOSIS — C50112 Malignant neoplasm of central portion of left female breast: Secondary | ICD-10-CM

## 2020-09-16 DIAGNOSIS — D0512 Intraductal carcinoma in situ of left breast: Secondary | ICD-10-CM | POA: Insufficient documentation

## 2020-09-16 DIAGNOSIS — C50412 Malignant neoplasm of upper-outer quadrant of left female breast: Secondary | ICD-10-CM | POA: Insufficient documentation

## 2020-09-16 DIAGNOSIS — Z79810 Long term (current) use of selective estrogen receptor modulators (SERMs): Secondary | ICD-10-CM | POA: Insufficient documentation

## 2020-09-16 LAB — CBC WITH DIFFERENTIAL (CANCER CENTER ONLY)
Abs Immature Granulocytes: 0.02 10*3/uL (ref 0.00–0.07)
Basophils Absolute: 0 10*3/uL (ref 0.0–0.1)
Basophils Relative: 0 %
Eosinophils Absolute: 0.2 10*3/uL (ref 0.0–0.5)
Eosinophils Relative: 2 %
HCT: 40.7 % (ref 36.0–46.0)
Hemoglobin: 12.8 g/dL (ref 12.0–15.0)
Immature Granulocytes: 0 %
Lymphocytes Relative: 20 %
Lymphs Abs: 1.6 10*3/uL (ref 0.7–4.0)
MCH: 26.7 pg (ref 26.0–34.0)
MCHC: 31.4 g/dL (ref 30.0–36.0)
MCV: 84.8 fL (ref 80.0–100.0)
Monocytes Absolute: 0.5 10*3/uL (ref 0.1–1.0)
Monocytes Relative: 6 %
Neutro Abs: 5.6 10*3/uL (ref 1.7–7.7)
Neutrophils Relative %: 72 %
Platelet Count: 230 10*3/uL (ref 150–400)
RBC: 4.8 MIL/uL (ref 3.87–5.11)
RDW: 14.6 % (ref 11.5–15.5)
WBC Count: 7.9 10*3/uL (ref 4.0–10.5)
nRBC: 0 % (ref 0.0–0.2)

## 2020-09-16 LAB — CMP (CANCER CENTER ONLY)
ALT: 14 U/L (ref 0–44)
AST: 12 U/L — ABNORMAL LOW (ref 15–41)
Albumin: 3.2 g/dL — ABNORMAL LOW (ref 3.5–5.0)
Alkaline Phosphatase: 76 U/L (ref 38–126)
Anion gap: 9 (ref 5–15)
BUN: 17 mg/dL (ref 6–20)
CO2: 25 mmol/L (ref 22–32)
Calcium: 8.8 mg/dL — ABNORMAL LOW (ref 8.9–10.3)
Chloride: 105 mmol/L (ref 98–111)
Creatinine: 0.75 mg/dL (ref 0.44–1.00)
GFR, Estimated: >60 mL/min (ref >60)
Glucose, Bld: 225 mg/dL — ABNORMAL HIGH (ref 70–99)
Potassium: 4.2 mmol/L (ref 3.5–5.1)
Sodium: 139 mmol/L (ref 135–145)
Total Bilirubin: 0.4 mg/dL (ref 0.3–1.2)
Total Protein: 6.5 g/dL (ref 6.5–8.1)

## 2020-09-20 ENCOUNTER — Encounter (HOSPITAL_BASED_OUTPATIENT_CLINIC_OR_DEPARTMENT_OTHER): Payer: Self-pay | Admitting: Plastic Surgery

## 2020-09-20 ENCOUNTER — Other Ambulatory Visit: Payer: Self-pay

## 2020-09-23 ENCOUNTER — Ambulatory Visit: Payer: No Typology Code available for payment source

## 2020-09-29 ENCOUNTER — Ambulatory Visit (HOSPITAL_BASED_OUTPATIENT_CLINIC_OR_DEPARTMENT_OTHER): Payer: No Typology Code available for payment source | Admitting: Certified Registered Nurse Anesthetist

## 2020-09-29 ENCOUNTER — Encounter (HOSPITAL_BASED_OUTPATIENT_CLINIC_OR_DEPARTMENT_OTHER): Payer: Self-pay | Admitting: Plastic Surgery

## 2020-09-29 ENCOUNTER — Ambulatory Visit (HOSPITAL_BASED_OUTPATIENT_CLINIC_OR_DEPARTMENT_OTHER)
Admission: RE | Admit: 2020-09-29 | Discharge: 2020-09-29 | Disposition: A | Discharge status: Home / Self Care | Payer: No Typology Code available for payment source | Attending: Plastic Surgery | Admitting: Plastic Surgery

## 2020-09-29 ENCOUNTER — Encounter (HOSPITAL_BASED_OUTPATIENT_CLINIC_OR_DEPARTMENT_OTHER)
Admission: RE | Disposition: A | Discharge status: Home / Self Care | Payer: Self-pay | Source: Home / Self Care | Attending: Plastic Surgery

## 2020-09-29 ENCOUNTER — Other Ambulatory Visit: Payer: Self-pay

## 2020-09-29 DIAGNOSIS — Z923 Personal history of irradiation: Secondary | ICD-10-CM

## 2020-09-29 DIAGNOSIS — E119 Type 2 diabetes mellitus without complications: Secondary | ICD-10-CM | POA: Diagnosis not present

## 2020-09-29 DIAGNOSIS — Z7984 Long term (current) use of oral hypoglycemic drugs: Secondary | ICD-10-CM | POA: Diagnosis not present

## 2020-09-29 DIAGNOSIS — L598 Other specified disorders of the skin and subcutaneous tissue related to radiation: Secondary | ICD-10-CM | POA: Insufficient documentation

## 2020-09-29 DIAGNOSIS — Z8349 Family history of other endocrine, nutritional and metabolic diseases: Secondary | ICD-10-CM | POA: Insufficient documentation

## 2020-09-29 DIAGNOSIS — Z79810 Long term (current) use of selective estrogen receptor modulators (SERMs): Secondary | ICD-10-CM | POA: Diagnosis not present

## 2020-09-29 DIAGNOSIS — Z880 Allergy status to penicillin: Secondary | ICD-10-CM | POA: Diagnosis not present

## 2020-09-29 DIAGNOSIS — Z801 Family history of malignant neoplasm of trachea, bronchus and lung: Secondary | ICD-10-CM | POA: Insufficient documentation

## 2020-09-29 DIAGNOSIS — Z853 Personal history of malignant neoplasm of breast: Secondary | ICD-10-CM | POA: Insufficient documentation

## 2020-09-29 DIAGNOSIS — Y848 Other medical procedures as the cause of abnormal reaction of the patient, or of later complication, without mention of misadventure at the time of the procedure: Secondary | ICD-10-CM | POA: Diagnosis not present

## 2020-09-29 DIAGNOSIS — Z8042 Family history of malignant neoplasm of prostate: Secondary | ICD-10-CM | POA: Insufficient documentation

## 2020-09-29 DIAGNOSIS — T8189XA Other complications of procedures, not elsewhere classified, initial encounter: Secondary | ICD-10-CM | POA: Diagnosis not present

## 2020-09-29 DIAGNOSIS — Z87891 Personal history of nicotine dependence: Secondary | ICD-10-CM | POA: Insufficient documentation

## 2020-09-29 DIAGNOSIS — Z881 Allergy status to other antibiotic agents status: Secondary | ICD-10-CM | POA: Insufficient documentation

## 2020-09-29 DIAGNOSIS — Z888 Allergy status to other drugs, medicaments and biological substances status: Secondary | ICD-10-CM | POA: Diagnosis not present

## 2020-09-29 DIAGNOSIS — Z79899 Other long term (current) drug therapy: Secondary | ICD-10-CM | POA: Diagnosis not present

## 2020-09-29 DIAGNOSIS — Z85850 Personal history of malignant neoplasm of thyroid: Secondary | ICD-10-CM | POA: Diagnosis not present

## 2020-09-29 DIAGNOSIS — E669 Obesity, unspecified: Secondary | ICD-10-CM | POA: Insufficient documentation

## 2020-09-29 DIAGNOSIS — Z8249 Family history of ischemic heart disease and other diseases of the circulatory system: Secondary | ICD-10-CM | POA: Diagnosis not present

## 2020-09-29 DIAGNOSIS — Z794 Long term (current) use of insulin: Secondary | ICD-10-CM | POA: Diagnosis not present

## 2020-09-29 DIAGNOSIS — Z808 Family history of malignant neoplasm of other organs or systems: Secondary | ICD-10-CM | POA: Insufficient documentation

## 2020-09-29 DIAGNOSIS — N6489 Other specified disorders of breast: Secondary | ICD-10-CM | POA: Insufficient documentation

## 2020-09-29 DIAGNOSIS — N651 Disproportion of reconstructed breast: Secondary | ICD-10-CM

## 2020-09-29 DIAGNOSIS — Z8051 Family history of malignant neoplasm of kidney: Secondary | ICD-10-CM | POA: Diagnosis not present

## 2020-09-29 DIAGNOSIS — Z7982 Long term (current) use of aspirin: Secondary | ICD-10-CM | POA: Diagnosis not present

## 2020-09-29 DIAGNOSIS — Z825 Family history of asthma and other chronic lower respiratory diseases: Secondary | ICD-10-CM | POA: Diagnosis not present

## 2020-09-29 DIAGNOSIS — Z6837 Body mass index (BMI) 37.0-37.9, adult: Secondary | ICD-10-CM | POA: Diagnosis not present

## 2020-09-29 HISTORY — PX: INCISION AND DRAINAGE OF WOUND: SHX1803

## 2020-09-29 HISTORY — PX: APPLICATION OF A-CELL OF EXTREMITY: SHX6303

## 2020-09-29 LAB — GLUCOSE, CAPILLARY
Glucose-Capillary: 162 mg/dL — ABNORMAL HIGH (ref 70–99)
Glucose-Capillary: 176 mg/dL — ABNORMAL HIGH (ref 70–99)

## 2020-09-29 SURGERY — IRRIGATION AND DEBRIDEMENT WOUND
Anesthesia: Monitor Anesthesia Care | Site: Breast | Laterality: Left

## 2020-09-29 MED ORDER — OXYCODONE HCL 5 MG PO TABS: 5.0000 mg | ORAL_TABLET | ORAL | Status: DC | PRN

## 2020-09-29 MED ORDER — CIPROFLOXACIN IN D5W 400 MG/200ML IV SOLN
INTRAVENOUS | Status: AC
  Filled 2020-09-29: qty 200

## 2020-09-29 MED ORDER — LIDOCAINE-EPINEPHRINE 1 %-1:100000 IJ SOLN
INTRAMUSCULAR | Status: AC
  Filled 2020-09-29: qty 1

## 2020-09-29 MED ORDER — CHLORHEXIDINE GLUCONATE CLOTH 2 % EX PADS: 6.0000 | MEDICATED_PAD | Freq: Once | CUTANEOUS | Status: DC

## 2020-09-29 MED ORDER — LIDOCAINE-EPINEPHRINE 1 %-1:100000 IJ SOLN
INTRAMUSCULAR | Status: DC | PRN
  Administered 2020-09-29: 7 mL

## 2020-09-29 MED ORDER — DIPHENHYDRAMINE HCL 25 MG PO CAPS
ORAL_CAPSULE | ORAL | Status: AC
  Filled 2020-09-29: qty 1

## 2020-09-29 MED ORDER — DEXAMETHASONE SODIUM PHOSPHATE 10 MG/ML IJ SOLN
INTRAMUSCULAR | Status: AC
  Filled 2020-09-29: qty 1

## 2020-09-29 MED ORDER — BUPIVACAINE-EPINEPHRINE (PF) 0.25% -1:200000 IJ SOLN
INTRAMUSCULAR | Status: AC
  Filled 2020-09-29: qty 30

## 2020-09-29 MED ORDER — CIPROFLOXACIN IN D5W 400 MG/200ML IV SOLN
INTRAVENOUS | Status: DC | PRN
  Administered 2020-09-29: 400 mg via INTRAVENOUS

## 2020-09-29 MED ORDER — OXYCODONE HCL 5 MG/5ML PO SOLN: 5.0000 mg | Freq: Once | ORAL | Status: DC | PRN

## 2020-09-29 MED ORDER — ONDANSETRON HCL 4 MG/2ML IJ SOLN
INTRAMUSCULAR | Status: AC
  Filled 2020-09-29: qty 2

## 2020-09-29 MED ORDER — MINERAL OIL LIGHT OIL
TOPICAL_OIL | Status: AC
  Filled 2020-09-29: qty 10

## 2020-09-29 MED ORDER — ACETAMINOPHEN 325 MG RE SUPP: 650.0000 mg | RECTAL | Status: DC | PRN

## 2020-09-29 MED ORDER — WHITE PETROLATUM EX OINT
TOPICAL_OINTMENT | CUTANEOUS | Status: DC | PRN
  Administered 2020-09-29: 1 via TOPICAL

## 2020-09-29 MED ORDER — ONDANSETRON HCL 4 MG/2ML IJ SOLN
INTRAMUSCULAR | Status: DC | PRN
  Administered 2020-09-29: 4 mg via INTRAVENOUS

## 2020-09-29 MED ORDER — SODIUM CHLORIDE 0.9 % IV SOLN: 250.0000 mL | INTRAVENOUS | Status: DC | PRN

## 2020-09-29 MED ORDER — FENTANYL CITRATE (PF) 100 MCG/2ML IJ SOLN
INTRAMUSCULAR | Status: DC | PRN
  Administered 2020-09-29 (×2): 25 ug via INTRAVENOUS

## 2020-09-29 MED ORDER — BUPIVACAINE HCL (PF) 0.25 % IJ SOLN
INTRAMUSCULAR | Status: AC
  Filled 2020-09-29: qty 30

## 2020-09-29 MED ORDER — HEPARIN SOD (PORK) LOCK FLUSH 100 UNIT/ML IV SOLN
INTRAVENOUS | Status: AC
  Filled 2020-09-29: qty 5

## 2020-09-29 MED ORDER — PROMETHAZINE HCL 25 MG/ML IJ SOLN: 6.2500 mg | INTRAMUSCULAR | Status: DC | PRN

## 2020-09-29 MED ORDER — HEPARIN (PORCINE) IN NACL 1000-0.9 UT/500ML-% IV SOLN
INTRAVENOUS | Status: AC
  Filled 2020-09-29: qty 500

## 2020-09-29 MED ORDER — VANCOMYCIN HCL 1000 MG IV SOLR
INTRAVENOUS | Status: AC
  Filled 2020-09-29: qty 20

## 2020-09-29 MED ORDER — MIDAZOLAM HCL 2 MG/2ML IJ SOLN
INTRAMUSCULAR | Status: AC
  Filled 2020-09-29: qty 2

## 2020-09-29 MED ORDER — LACTATED RINGERS IV SOLN: INTRAVENOUS | Status: DC

## 2020-09-29 MED ORDER — BUPIVACAINE HCL (PF) 0.5 % IJ SOLN
INTRAMUSCULAR | Status: AC
  Filled 2020-09-29: qty 30

## 2020-09-29 MED ORDER — SODIUM CHLORIDE 0.9% FLUSH: 3.0000 mL | Freq: Two times a day (BID) | INTRAVENOUS | Status: DC

## 2020-09-29 MED ORDER — SODIUM CHLORIDE 0.9% FLUSH: 3.0000 mL | INTRAVENOUS | Status: DC | PRN

## 2020-09-29 MED ORDER — CEFAZOLIN SODIUM-DEXTROSE 2-4 GM/100ML-% IV SOLN: 2.0000 g | INTRAVENOUS | Status: DC

## 2020-09-29 MED ORDER — PROPOFOL 500 MG/50ML IV EMUL
INTRAVENOUS | Status: AC
  Filled 2020-09-29: qty 50

## 2020-09-29 MED ORDER — HYDROMORPHONE HCL 1 MG/ML IJ SOLN: 0.2500 mg | INTRAMUSCULAR | Status: DC | PRN

## 2020-09-29 MED ORDER — LIDOCAINE HCL (PF) 2 % IJ SOLN
INTRAMUSCULAR | Status: AC
  Filled 2020-09-29: qty 10

## 2020-09-29 MED ORDER — OXYCODONE HCL 5 MG PO TABS: 5.0000 mg | ORAL_TABLET | Freq: Once | ORAL | Status: DC | PRN

## 2020-09-29 MED ORDER — PROPOFOL 10 MG/ML IV BOLUS
INTRAVENOUS | Status: AC
  Filled 2020-09-29: qty 40

## 2020-09-29 MED ORDER — DIPHENHYDRAMINE HCL 25 MG PO CAPS
25.0000 mg | ORAL_CAPSULE | Freq: Once | ORAL | Status: AC
  Administered 2020-09-29: 25 mg via ORAL

## 2020-09-29 MED ORDER — PROPOFOL 500 MG/50ML IV EMUL
INTRAVENOUS | Status: DC | PRN
  Administered 2020-09-29: 100 ug/kg/min via INTRAVENOUS

## 2020-09-29 MED ORDER — FENTANYL CITRATE (PF) 100 MCG/2ML IJ SOLN
INTRAMUSCULAR | Status: AC
  Filled 2020-09-29: qty 2

## 2020-09-29 MED ORDER — CEFAZOLIN SODIUM-DEXTROSE 2-4 GM/100ML-% IV SOLN
INTRAVENOUS | Status: AC
  Filled 2020-09-29: qty 100

## 2020-09-29 MED ORDER — MIDAZOLAM HCL 5 MG/5ML IJ SOLN
INTRAMUSCULAR | Status: DC | PRN
  Administered 2020-09-29: 1 mg via INTRAVENOUS
  Administered 2020-09-29: .5 mg via INTRAVENOUS

## 2020-09-29 MED ORDER — PROPOFOL 10 MG/ML IV BOLUS
INTRAVENOUS | Status: DC | PRN
  Administered 2020-09-29: 10 mg via INTRAVENOUS

## 2020-09-29 MED ORDER — ACETAMINOPHEN 325 MG PO TABS: 650.0000 mg | ORAL_TABLET | ORAL | Status: DC | PRN

## 2020-09-29 SURGICAL SUPPLY — 82 items
ADH SKN CLS APL DERMABOND .7 (GAUZE/BANDAGES/DRESSINGS)
APL SKNCLS STERI-STRIP NONHPOA (GAUZE/BANDAGES/DRESSINGS)
BAG DECANTER FOR FLEXI CONT (MISCELLANEOUS) IMPLANT
BENZOIN TINCTURE PRP APPL 2/3 (GAUZE/BANDAGES/DRESSINGS) IMPLANT
BLADE HEX COATED 2.75 (ELECTRODE) IMPLANT
BLADE SURG 10 STRL SS (BLADE) ×3 IMPLANT
BLADE SURG 15 STRL LF DISP TIS (BLADE) ×2 IMPLANT
BLADE SURG 15 STRL SS (BLADE) ×3
BNDG COHESIVE 4X5 TAN ST LF (GAUZE/BANDAGES/DRESSINGS) IMPLANT
BNDG ELASTIC 3X5.8 VLCR STR LF (GAUZE/BANDAGES/DRESSINGS) IMPLANT
BNDG ELASTIC 4X5.8 VLCR STR LF (GAUZE/BANDAGES/DRESSINGS) IMPLANT
BNDG ELASTIC 6X5.8 VLCR STR LF (GAUZE/BANDAGES/DRESSINGS) IMPLANT
BNDG GAUZE ELAST 4 BULKY (GAUZE/BANDAGES/DRESSINGS) ×3 IMPLANT
CANISTER SUCT 1200ML W/VALVE (MISCELLANEOUS) IMPLANT
COVER BACK TABLE 60X90IN (DRAPES) ×3 IMPLANT
COVER MAYO STAND STRL (DRAPES) ×3 IMPLANT
DECANTER SPIKE VIAL GLASS SM (MISCELLANEOUS) IMPLANT
DERMABOND ADVANCED (GAUZE/BANDAGES/DRESSINGS)
DERMABOND ADVANCED .7 DNX12 (GAUZE/BANDAGES/DRESSINGS) IMPLANT
DRAIN CHANNEL 19F RND (DRAIN) IMPLANT
DRAIN PENROSE 1/2X12 LTX STRL (WOUND CARE) IMPLANT
DRAPE INCISE IOBAN 66X45 STRL (DRAPES) IMPLANT
DRAPE LAPAROSCOPIC ABDOMINAL (DRAPES) IMPLANT
DRAPE TOP ARMCOVERS (MISCELLANEOUS) IMPLANT
DRAPE U-SHAPE 76X120 STRL (DRAPES) ×3 IMPLANT
DRSG ADAPTIC 3X8 NADH LF (GAUZE/BANDAGES/DRESSINGS) IMPLANT
DRSG EMULSION OIL 3X3 NADH (GAUZE/BANDAGES/DRESSINGS) IMPLANT
DRSG HYDROCOLLOID 4X4 (GAUZE/BANDAGES/DRESSINGS) IMPLANT
DRSG MEPILEX BORDER 4X8 (GAUZE/BANDAGES/DRESSINGS) ×3 IMPLANT
DRSG PAD ABDOMINAL 8X10 ST (GAUZE/BANDAGES/DRESSINGS) IMPLANT
ELECT REM PT RETURN 9FT ADLT (ELECTROSURGICAL) ×3
ELECTRODE REM PT RTRN 9FT ADLT (ELECTROSURGICAL) ×2 IMPLANT
EVACUATOR SILICONE 100CC (DRAIN) IMPLANT
GAUZE SPONGE 4X4 12PLY STRL (GAUZE/BANDAGES/DRESSINGS) IMPLANT
GAUZE SPONGE 4X4 12PLY STRL LF (GAUZE/BANDAGES/DRESSINGS) IMPLANT
GAUZE XEROFORM 1X8 LF (GAUZE/BANDAGES/DRESSINGS) IMPLANT
GAUZE XEROFORM 5X9 LF (GAUZE/BANDAGES/DRESSINGS) ×3 IMPLANT
GLOVE SURG ENC MOIS LTX SZ6 (GLOVE) ×6 IMPLANT
GOWN STRL REUS W/ TWL LRG LVL3 (GOWN DISPOSABLE) ×4 IMPLANT
GOWN STRL REUS W/TWL LRG LVL3 (GOWN DISPOSABLE) ×6
IV NS IRRIG 3000ML ARTHROMATIC (IV SOLUTION) IMPLANT
MANIFOLD NEPTUNE II (INSTRUMENTS) IMPLANT
MATRIX WOUND 3-LAYER 5X5 (Tissue) ×3 IMPLANT
MICROMATRIX 500MG (Tissue) ×3 IMPLANT
NEEDLE HYPO 25X1 1.5 SAFETY (NEEDLE) ×3 IMPLANT
NS IRRIG 1000ML POUR BTL (IV SOLUTION) ×3 IMPLANT
PACK BASIN DAY SURGERY FS (CUSTOM PROCEDURE TRAY) ×3 IMPLANT
PADDING CAST ABS 3INX4YD NS (CAST SUPPLIES)
PADDING CAST ABS 4INX4YD NS (CAST SUPPLIES)
PADDING CAST ABS COTTON 3X4 (CAST SUPPLIES) IMPLANT
PADDING CAST ABS COTTON 4X4 ST (CAST SUPPLIES) IMPLANT
PENCIL SMOKE EVACUATOR (MISCELLANEOUS) ×3 IMPLANT
PIN SAFETY STERILE (MISCELLANEOUS) IMPLANT
SHEET MEDIUM DRAPE 40X70 STRL (DRAPES) IMPLANT
SLEEVE SCD COMPRESS KNEE MED (STOCKING) ×3 IMPLANT
SOLUTION PARTIC MCRMTRX 500MG (Tissue) ×2 IMPLANT
SPLINT FIBERGLASS 3X35 (CAST SUPPLIES) IMPLANT
SPLINT FIBERGLASS 4X30 (CAST SUPPLIES) IMPLANT
SPONGE T-LAP 18X18 ~~LOC~~+RFID (SPONGE) ×3 IMPLANT
STAPLER VISISTAT 35W (STAPLE) IMPLANT
STOCKINETTE IMPERVIOUS LG (DRAPES) IMPLANT
STRIP CLOSURE SKIN 1/2X4 (GAUZE/BANDAGES/DRESSINGS) IMPLANT
SUCTION FRAZIER HANDLE 10FR (MISCELLANEOUS)
SUCTION TUBE FRAZIER 10FR DISP (MISCELLANEOUS) IMPLANT
SURGILUBE 2OZ TUBE FLIPTOP (MISCELLANEOUS) IMPLANT
SUT MNCRL AB 4-0 PS2 18 (SUTURE) ×3 IMPLANT
SUT MON AB 3-0 SH 27 (SUTURE)
SUT MON AB 3-0 SH27 (SUTURE) IMPLANT
SUT SILK 3 0 PS 1 (SUTURE) IMPLANT
SUT VIC AB 3-0 FS2 27 (SUTURE) IMPLANT
SUT VIC AB 5-0 PS2 18 (SUTURE) ×3 IMPLANT
SUT VICRYL 4-0 PS2 18IN ABS (SUTURE) IMPLANT
SWAB COLLECTION DEVICE MRSA (MISCELLANEOUS) IMPLANT
SWAB CULTURE ESWAB REG 1ML (MISCELLANEOUS) IMPLANT
SYR BULB IRRIG 60ML STRL (SYRINGE) IMPLANT
SYR CONTROL 10ML LL (SYRINGE) ×3 IMPLANT
TAPE HYPAFIX 6X30 (GAUZE/BANDAGES/DRESSINGS) IMPLANT
TOWEL GREEN STERILE FF (TOWEL DISPOSABLE) ×3 IMPLANT
TRAY DSU PREP LF (CUSTOM PROCEDURE TRAY) ×3 IMPLANT
TUBE CONNECTING 20X1/4 (TUBING) IMPLANT
UNDERPAD 30X36 HEAVY ABSORB (UNDERPADS AND DIAPERS) ×3 IMPLANT
YANKAUER SUCT BULB TIP NO VENT (SUCTIONS) IMPLANT

## 2020-09-29 NOTE — Anesthesia Preprocedure Evaluation (Signed)
Anesthesia Evaluation  Patient identified by MRN, date of birth, ID band Patient awake    Reviewed: Allergy & Precautions, NPO status , Patient's Chart, lab work & pertinent test results  Airway Mallampati: II  TM Distance: >3 FB Neck ROM: Full    Dental no notable dental hx.    Pulmonary sleep apnea , former smoker,    Pulmonary exam normal breath sounds clear to auscultation       Cardiovascular hypertension, + CAD  Normal cardiovascular exam Rhythm:Regular Rate:Normal     Neuro/Psych  Headaches, Anxiety Depression negative psych ROS   GI/Hepatic Neg liver ROS, GERD  ,  Endo/Other  diabetes, Type 2Hypothyroidism   Renal/GU negative Renal ROS  negative genitourinary   Musculoskeletal negative musculoskeletal ROS (+)   Abdominal (+) + obese,   Peds negative pediatric ROS (+)  Hematology negative hematology ROS (+)   Anesthesia Other Findings   Reproductive/Obstetrics negative OB ROS                             Anesthesia Physical  Anesthesia Plan  ASA: 3  Anesthesia Plan: MAC   Post-op Pain Management:    Induction: Intravenous  PONV Risk Score and Plan: 2 and Ondansetron, Midazolam and Treatment may vary due to age or medical condition  Airway Management Planned: Simple Face Mask  Additional Equipment:   Intra-op Plan:   Post-operative Plan:   Informed Consent: I have reviewed the patients History and Physical, chart, labs and discussed the procedure including the risks, benefits and alternatives for the proposed anesthesia with the patient or authorized representative who has indicated his/her understanding and acceptance.     Dental advisory given  Plan Discussed with: CRNA and Surgeon  Anesthesia Plan Comments: (PAT note written 07/12/2020 by Myra Gianotti, PA-C. )        Anesthesia Quick Evaluation

## 2020-09-29 NOTE — Anesthesia Postprocedure Evaluation (Signed)
Anesthesia Post Note  Patient: Stacey Roberts  Procedure(s) Performed: Excision of back and left breast wound (Left: Breast) placement of ACell (Left: Back)     Patient location during evaluation: PACU Anesthesia Type: MAC Level of consciousness: awake and alert Pain management: pain level controlled Vital Signs Assessment: post-procedure vital signs reviewed and stable Respiratory status: spontaneous breathing, nonlabored ventilation and respiratory function stable Cardiovascular status: blood pressure returned to baseline and stable Postop Assessment: no apparent nausea or vomiting Anesthetic complications: no   No notable events documented.  Last Vitals:  Vitals:   09/29/20 0945 09/29/20 1008  BP: 110/73 131/73  Pulse: 79 80  Resp: 14 16  Temp:  36.9 C  SpO2: 96% 95%    Last Pain:  Vitals:   09/29/20 1000  TempSrc:   PainSc: 0-No pain                 Lynda Rainwater

## 2020-09-29 NOTE — Op Note (Addendum)
DATE OF OPERATION: 09/29/2020  LOCATION: [facility]  PREOPERATIVE DIAGNOSIS: Left breast wound and left back wound  POSTOPERATIVE DIAGNOSIS: Same  PROCEDURE:  Excision of left back wound 5 x 20 mm skin and fibrous tissue 2.   Excision of left breast wound to 2.5 x 2.5 cm skin and fat 3.   Placement of ACell powder 500 mg and sheet 5 x 5 cm 4.   Removal of clip left breast  SURGEON: Lyndee Leo Sanger Chatara Lucente, DO  ASSISTANT: Krista Blue, PA  EBL: Nil  CONDITION: Stable  COMPLICATIONS: None  INDICATION: The patient, Stacey Roberts, is a 55 y.o. female born on 10-15-1965, is here for treatment of a left breast wound and left back wound.  The patient had a partial mastectomy with radiation.  She then underwent reconstruction with a latissimus flap.  Some of the radiated skin did not respond and she ended up with a wound on the left breast.  She had a little bit of trouble healing the back incision.  The decision was made to bring her to the OR for debridement to initiate improved healing.   PROCEDURE DETAILS:  The patient was seen prior to surgery and marked.  The IV antibiotics were given. The patient was taken to the operating room and given an anesthetic. A standard time out was performed and all information was confirmed by those in the room. SCDs were placed.   The patient was placed on the OR table in the sitting position.  Her back was prepped and draped.  A pair of tissue scissors and pickups were used to excise the left back wound that was 5 x 20 mm.  Vaseline and a sterile dressing was applied.  The patient was then placed in the supine position.  Another timeout was called.  She was prepped and draped.  Local with epinephrine was injected around the left breast site.  A #15 blade was used to excise the 2.5 x 2.5 wound that consisted of skin and fat and fibrous tissue.  There was a clip in the wound.  It may have been superficial enough that it was contributing to the  lack of healing.  This was from the mastectomy.  There was healthy bleeding which was encouraging.  Then all of the ACell powder and sheet was layered and applied.  It was tacked in place with 4 and 5-0 Vicryl.  The Adaptic was sutured on top and a sterile KY dressing was applied.  The patient was allowed to wake up and taken to recovery room in stable condition at the end of the case. The family was notified at the end of the case.   The advanced practice practitioner (APP) assisted throughout the case.  The APP was essential in retraction and counter traction when needed to make the case progress smoothly.  This retraction and assistance made it possible to see the tissue plans for the procedure.  The assistance was needed for blood control, tissue re-approximation and assisted with closure of the incision site.

## 2020-09-29 NOTE — Anesthesia Procedure Notes (Signed)
Procedure Name: MAC Date/Time: 09/29/2020 8:35 AM Performed by: Janene Harvey, CRNA Pre-anesthesia Checklist: Patient identified, Emergency Drugs available, Suction available and Patient being monitored Patient Re-evaluated:Patient Re-evaluated prior to induction Oxygen Delivery Method: Simple face mask Placement Confirmation: positive ETCO2 Dental Injury: Teeth and Oropharynx as per pre-operative assessment

## 2020-09-29 NOTE — Interval H&P Note (Signed)
History and Physical Interval Note:  09/29/2020 7:58 AM  Stacey Roberts  has presented today for surgery, with the diagnosis of Status Post Radiation Therapy, Postoperative breast asymmetry,  Breast asymmetry following reconstructive surgery.  The various methods of treatment have been discussed with the patient and family. After consideration of risks, benefits and other options for treatment, the patient has consented to  Procedure(s) with comments: Excision of back and left breast wound (Left) - 1 hour placement of ACell (Left) as a surgical intervention.  The patient's history has been reviewed, patient examined, no change in status, stable for surgery.  I have reviewed the patient's chart and labs.  Questions were answered to the patient's satisfaction.     Loel Lofty Stacey Roberts

## 2020-09-29 NOTE — Discharge Instructions (Addendum)
INSTRUCTIONS FOR AFTER SURGERY   You will likely have some questions about what to expect following your operation.  The following information will help you and your family understand what to expect when you are discharged from the hospital.  Following these guidelines will help ensure a smooth recovery and reduce risks of complications.  Postoperative instructions include information on: diet, wound care, medications and physical activity.  AFTER SURGERY Expect to go home after the procedure.  In some cases, you may need to spend one night in the hospital for observation.  DIET This surgery does not require a specific diet.  However, I have to mention that the healthier you eat the better your body can start healing. It is important to increasing your protein intake.  This means limiting the foods with added sugar.  Focus on fruits and vegetables and some meat. It is very important to drink water after your surgery.  If your urine is bright yellow, then it is concentrated, and you need to drink more water.  As a general rule after surgery, you should have 8 ounces of water every hour while awake.  If you find you are persistently nauseated or unable to take in liquids let us know.  NO TOBACCO USE or EXPOSURE.  This will slow your healing process and increase the risk of a wound.  WOUND CARE: Don't get the area on your breast wet until you come see the surgeon.  You will need to put KY gel on the breast daily and keep covered.  Continue with vaseline gauze or vaseline to the back daily.  You can get the back wet.    ACTIVITY No heavy lifting until cleared by the doctor.  It is OK to walk and climb stairs. In fact, moving your legs is very important to decrease your risk of a blood clot.  It will also help keep you from getting deconditioned.  Every 1 to 2 hours get up and walk for 5 minutes. This will help with a quicker recovery back to normal.  Let pain be your guide so you don't do too much.     WORK Everyone returns to work at different times. As a rough guide, most people take at least 1 - 2 weeks off prior to returning to work. If you need documentation for your job, bring the forms to your postoperative follow up visit.  DRIVING Arrange for someone to bring you home from the hospital.  You may be able to drive a few days after surgery but not while taking any narcotics or valium.  BOWEL MOVEMENTS Constipation can occur after anesthesia and while taking pain medication.  It is important to stay ahead for your comfort.  We recommend taking Milk of Magnesia (2 tablespoons; twice a day) while taking the pain pills.  SEROMA This is fluid your body tried to put in the surgical site.  This is normal but if it creates excessive pain and swelling let us know.  It usually decreases in a few weeks.  MEDICATIONS and PAIN CONTROL At your preoperative visit for you history and physical you were given the following medications: An antibiotic: Start this medication when you get home and take according to the instructions on the bottle. Zofran 4 mg:  This is to treat nausea and vomiting.  You can take this every 6 hours as needed and only if needed. Norco (hydrocodone/acetaminophen) 5/325 mg:  This is only to be used after you have taken the motrin or  the tylenol. Every 8 hours as needed. Over the counter Medication to take: Ibuprofen (Motrin) 600 mg:  Take this every 6 hours.  If you have additional pain then take 500 mg of the tylenol.  Only take the Norco after you have tried these two. Miralax or stool softener of choice: Take this according to the bottle if you take the Orange Call your surgeon's office if any of the following occur:  Fever 101 degrees F or greater  Excessive bleeding or fluid from the incision site.  Pain that increases over time without aid from the medications  Redness, warmth, or pus draining from incision sites  Persistent nausea or inability to  take in liquids  Severe misshapen area that underwent the operation.   Post Anesthesia Home Care Instructions  Activity: Get plenty of rest for the remainder of the day. A responsible individual must stay with you for 24 hours following the procedure.  For the next 24 hours, DO NOT: -Drive a car -Paediatric nurse -Drink alcoholic beverages -Take any medication unless instructed by your physician -Make any legal decisions or sign important papers.  Meals: Start with liquid foods such as gelatin or soup. Progress to regular foods as tolerated. Avoid greasy, spicy, heavy foods. If nausea and/or vomiting occur, drink only clear liquids until the nausea and/or vomiting subsides. Call your physician if vomiting continues.  Special Instructions/Symptoms: Your throat may feel dry or sore from the anesthesia or the breathing tube placed in your throat during surgery. If this causes discomfort, gargle with warm salt water. The discomfort should disappear within 24 hours.  If you had a scopolamine patch placed behind your ear for the management of post- operative nausea and/or vomiting:  1. The medication in the patch is effective for 72 hours, after which it should be removed.  Wrap patch in a tissue and discard in the trash. Wash hands thoroughly with soap and water. 2. You may remove the patch earlier than 72 hours if you experience unpleasant side effects which may include dry mouth, dizziness or visual disturbances. 3. Avoid touching the patch. Wash your hands with soap and water after contact with the patch.

## 2020-09-29 NOTE — Transfer of Care (Signed)
Immediate Anesthesia Transfer of Care Note  Patient: Stacey Roberts  Procedure(s) Performed: Excision of back and left breast wound (Left: Breast) placement of ACell (Left: Back)  Patient Location: PACU  Anesthesia Type:MAC  Level of Consciousness: awake, alert  and patient cooperative  Airway & Oxygen Therapy: Patient Spontanous Breathing and Patient connected to face mask oxygen  Post-op Assessment: Report given to RN and Post -op Vital signs reviewed and stable  Post vital signs: Reviewed  Last Vitals:  Vitals Value Taken Time  BP 109/69 09/29/20 0907  Temp    Pulse 88 09/29/20 0910  Resp 22 09/29/20 0910  SpO2 100 % 09/29/20 0910  Vitals shown include unvalidated device data.  Last Pain:  Vitals:   09/29/20 0717  TempSrc: Oral  PainSc: 0-No pain         Complications: No notable events documented.

## 2020-09-30 ENCOUNTER — Encounter (HOSPITAL_BASED_OUTPATIENT_CLINIC_OR_DEPARTMENT_OTHER): Payer: Self-pay | Admitting: Plastic Surgery

## 2020-09-30 ENCOUNTER — Other Ambulatory Visit: Payer: Self-pay

## 2020-09-30 MED FILL — Levothyroxine Sodium Tab 137 MCG: ORAL | 90 days supply | Qty: 90 | Fill #1 | Status: AC

## 2020-10-06 ENCOUNTER — Other Ambulatory Visit: Payer: Self-pay

## 2020-10-06 ENCOUNTER — Encounter: Payer: Self-pay | Admitting: Endocrinology

## 2020-10-07 ENCOUNTER — Other Ambulatory Visit: Payer: Self-pay

## 2020-10-07 DIAGNOSIS — E1142 Type 2 diabetes mellitus with diabetic polyneuropathy: Secondary | ICD-10-CM

## 2020-10-07 MED ORDER — FREESTYLE LITE TEST VI STRP
1.0000 | ORAL_STRIP | Freq: Two times a day (BID) | 0 refills | Status: DC
  Filled 2020-10-07: qty 200, 90d supply, fill #0

## 2020-10-08 ENCOUNTER — Other Ambulatory Visit: Payer: Self-pay

## 2020-10-08 ENCOUNTER — Encounter: Payer: Self-pay | Admitting: Plastic Surgery

## 2020-10-08 ENCOUNTER — Ambulatory Visit (INDEPENDENT_AMBULATORY_CARE_PROVIDER_SITE_OTHER): Payer: No Typology Code available for payment source | Admitting: Plastic Surgery

## 2020-10-08 DIAGNOSIS — N6489 Other specified disorders of breast: Secondary | ICD-10-CM

## 2020-10-08 NOTE — Progress Notes (Signed)
The patient is a 55 year old female here for follow-up on her surgery.  Her back looks really good.  My recommendation is for her to continue with dressing and only Vaseline if needed.  For the breast.  It appears she is filling in.  Recommend continuing with KY dressings daily.  I like to see her back in 2 weeks.

## 2020-10-11 ENCOUNTER — Other Ambulatory Visit: Payer: Self-pay | Admitting: Family

## 2020-10-11 ENCOUNTER — Telehealth: Payer: Self-pay | Admitting: *Deleted

## 2020-10-11 ENCOUNTER — Other Ambulatory Visit: Payer: Self-pay

## 2020-10-11 NOTE — Telephone Encounter (Signed)
Received DME Standard Written Order Waynetta Sandy) on (10/06/20) via of fax from [facility] [place] requesting signature and return.  Given to provider to sign.    DME Standard Written Order signed and faxed back to [facility] [place].  Confirmation received and copy scanned into the chart.//AB/CMA

## 2020-10-12 ENCOUNTER — Other Ambulatory Visit: Payer: Self-pay | Admitting: Family

## 2020-10-12 ENCOUNTER — Other Ambulatory Visit: Payer: Self-pay

## 2020-10-13 ENCOUNTER — Other Ambulatory Visit: Payer: Self-pay

## 2020-10-14 ENCOUNTER — Other Ambulatory Visit: Payer: Self-pay | Admitting: Internal Medicine

## 2020-10-14 ENCOUNTER — Other Ambulatory Visit: Payer: Self-pay

## 2020-10-14 NOTE — Telephone Encounter (Signed)
   Notes to clinic Not a pt in this practice, please assess.

## 2020-10-15 ENCOUNTER — Other Ambulatory Visit: Payer: Self-pay

## 2020-10-15 ENCOUNTER — Other Ambulatory Visit: Payer: Self-pay | Admitting: Internal Medicine

## 2020-10-15 MED FILL — Pantoprazole Sodium EC Tab 40 MG (Base Equiv): ORAL | 90 days supply | Qty: 90 | Fill #0 | Status: AC

## 2020-10-15 NOTE — Telephone Encounter (Signed)
Ok to wait until January

## 2020-10-15 NOTE — Telephone Encounter (Signed)
Requested medication (s) are due for refill today - yes  Requested medication (s) are on the active medication list -yes  Future visit scheduled -no  Last refill: 07/13/20  Notes to clinic: Call to patient to verify which office she wants to continue care at- patient wants to continue care with R Baity,NP. Patient was last seen by [facility] - January 2022- she wants to know if she needs to schedule now- or wait and schedule in January. Call forwarded for response and refill.  Requested Prescriptions  Pending Prescriptions Disp Refills   pantoprazole (PROTONIX) 40 MG tablet 90 tablet 0    Sig: TAKE 1 TABLET BY MOUTH DAILY.     Gastroenterology: Proton Pump Inhibitors Failed - 10/15/2020  9:56 AM      Failed - Valid encounter within last 12 months    Recent Outpatient Visits   None               Requested Prescriptions  Pending Prescriptions Disp Refills   pantoprazole (PROTONIX) 40 MG tablet 90 tablet 0    Sig: TAKE 1 TABLET BY MOUTH DAILY.     Gastroenterology: Proton Pump Inhibitors Failed - 10/15/2020  9:56 AM      Failed - Valid encounter within last 12 months    Recent Outpatient Visits   None

## 2020-10-18 ENCOUNTER — Ambulatory Visit (INDEPENDENT_AMBULATORY_CARE_PROVIDER_SITE_OTHER): Payer: No Typology Code available for payment source | Admitting: Cardiovascular Disease

## 2020-10-18 ENCOUNTER — Other Ambulatory Visit: Payer: Self-pay

## 2020-10-18 ENCOUNTER — Encounter: Payer: Self-pay | Admitting: Cardiovascular Disease

## 2020-10-18 ENCOUNTER — Ambulatory Visit (INDEPENDENT_AMBULATORY_CARE_PROVIDER_SITE_OTHER): Payer: No Typology Code available for payment source

## 2020-10-18 VITALS — BP 138/80 | HR 94 | Ht 64.0 in | Wt 218.1 lb

## 2020-10-18 DIAGNOSIS — R002 Palpitations: Secondary | ICD-10-CM

## 2020-10-18 DIAGNOSIS — R42 Dizziness and giddiness: Secondary | ICD-10-CM

## 2020-10-18 DIAGNOSIS — I25118 Atherosclerotic heart disease of native coronary artery with other forms of angina pectoris: Secondary | ICD-10-CM

## 2020-10-18 DIAGNOSIS — I499 Cardiac arrhythmia, unspecified: Secondary | ICD-10-CM

## 2020-10-18 NOTE — Progress Notes (Signed)
Cardiology Office Note  Date:  10/18/2020   ID:  Stacey Roberts, DOB [birth year], MRN [id number]  PCP:  Jearld Fenton, NP   Chief Complaint  Patient presents with   chest fluttering & lighheaded     Patient c/o shortness of breath, fluttering in chest, dizziness/lightheaded and pounding in chest. Medications reviewed by the patient verbally.     HPI:  Stacey Roberts is a 70 palpitationsy.o. female with a hx of  CAD with stable angina, DM 2, hypertension, hyperlipidemia, former tobacco use (quit 2017), breast cancer presents  today for follow-up of coronary artery disease  Prior records reviewed, Cardiac catheterization with Dr. Saunders Revel June 2021, disease of the RCA, medical management recommended  Echocardiogram May 2021 ejection fraction 60 to 65%  Review of prior notes indicates history of tachypalpitations Zio has previously been discussed, but has not been completed  On today's visit she reports that her tachypalpitations are worse Associated with shortness of breath, dizziness, tightness in the chest, general malaise when she has these episodes of tachycardia No triggers, 2 cups of coffee, no other stimulants Compliant with metoprolol tartrate 50 twice daily  Lab work reviewed A1C 8.2 Total chol 121  Orthostatics done today, lowest blood pressure 120/80 heart rate 102 Most of her heart rates ranging from 97-1 03 whether supine or standing after 3 minutes  EKG personally reviewed by myself on todays visit Normal sinus rhythm rate 94 bpm no significant ST-T wave changes  Other past medical history reviewed Hospital  April 2021 with chest pain palpitations.  ED work-up unrevealing.   Started on metoprolol in clinic   echo 05/29/2019 LVEF 60 to 65% with no regional wall motion abnormalities and trivial AI.    cardiac CTA with calcium score of 1373 placing her in the 99th percentile for age and sex.  Her FFR showed significant stenosis of the LCx, OM1, RCA   LHC 06/27/2019  showing significant single-vessel CAD with calcified stenosis up to 80% involving mid portion of small RCA with mild nonobstructive disease of LAD and LCx.    metoprolol increased to 50 mg twice daily.   Cardiac catheterization June 2021 Significant single-vessel coronary artery disease with calcified stenosis of up to 80% involving mid portion of small RCA.  There is mild, non-obstructive disease involving the LAD and LCx. Mildly elevated left ventricular filling pressure.  PMH:   has a past medical history of Allergy, BRCA negative (2017), Breast cancer ([place]) (2014), Breast cancer ([place]) (2017), Bronchitis, Coronary artery disease (06/2019), Diabetes mellitus, type II ([place]), Endometrial hyperplasia (2014), GERD (gastroesophageal reflux disease), Headache, History of radiation therapy (12/01/15-01/11/16), Hyperlipidemia, Hypertension, Hypothyroidism, Joint pain, Obesity, PCOS (polycystic ovarian syndrome), Personal history of radiation therapy (2017), Pneumonia, Sleep apnea (2008), Snoring, and Thyroid cancer ([place]).  PSH:    Past Surgical History:  Procedure Laterality Date   APPLICATION OF A-CELL OF EXTREMITY Left 09/29/2020   Procedure: placement of ACell;  Surgeon: Wallace Going, DO;  Location: ;  Service: Plastics;  Laterality: Left;   BREAST BIOPSY  2000   s/p   BREAST BIOPSY Left 01/22/2013   Procedure: RE-EXCISION LEFT BREAST DCIS;  Surgeon: Harl Bowie, MD;  Location: [place];  Service: General;  Laterality: Left;   BREAST LUMPECTOMY Left 01/06/2013   Procedure: LUMPECTOMY;  Surgeon: Harl Bowie, MD;  Location: [place];  Service: General;  Laterality: Left;   BREAST LUMPECTOMY Left 2017   BREAST LUMPECTOMY WITH NEEDLE LOCALIZATION  AND AXILLARY SENTINEL LYMPH NODE BX Left 09/30/2015   Procedure: Left BREAST LUMPECTOMY WITH NEEDLE LOCALIZATION AND AXILLARY SENTINEL LYMPH NODE BX;  Surgeon: Coralie Keens, MD;  Location: [place];   Service: General;  Laterality: Left;   BREAST RECONSTRUCTION Bilateral 10/12/2015   Procedure: BREAST oncoplastic RECONSTRUCTION;  Surgeon: Irene Limbo, MD;  Location: [place];  Service: Plastics;  Laterality: Bilateral;   BREAST REDUCTION SURGERY Bilateral 10/12/2015   Procedure: MAMMARY REDUCTION  (BREAST);  Surgeon: Irene Limbo, MD;  Location: [place];  Service: Plastics;  Laterality: Bilateral;   CORONARY ANGIOPLASTY     DILATION AND CURETTAGE OF UTERUS  2004   s/p   EXCISION OF BREAST LESION Left 10/12/2015   Procedure: Re-EXCISION OF BREAST;  Surgeon: Coralie Keens, MD;  Location: [place];  Service: General;  Laterality: Left;   INCISION AND DRAINAGE OF WOUND Left 09/29/2020   Procedure: Excision of back and left breast wound;  Surgeon: Wallace Going, DO;  Location: [place];  Service: Plastics;  Laterality: Left;  1 hour   LATISSIMUS FLAP TO BREAST Left 07/19/2020   Procedure: 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 90 tablet 3   Blood Glucose Monitoring Suppl (FREESTYLE LITE) DEVI 1 each by Does not apply route 2 (two) times daily. [name] 1 each 0   dapagliflozin propanediol (FARXIGA) 5 MG TABS tablet TAKE 1 TABLET BY MOUTH DAILY. 90 tablet 3   gabapentin (NEURONTIN) 300 MG capsule Take 2 capsules  (600 mg total) by mouth at bedtime. 180 capsule 4   glucose blood (FREESTYLE LITE) test strip Use 2 (two) times daily. (dx [name]) 200 each 0   insulin glargine-yfgn (SEMGLEE) 100 UNIT/ML Pen Inject 45 Units into the skin every morning. 45 mL 3   Insulin Pen Needle (UNIFINE PENTIPS) 31G X 8 MM MISC use to inject twice a day 100 each 3   Lancets (FREESTYLE) lancets 1 each by Other route 2 (two) times daily. [name] 200 each 0   levothyroxine (SYNTHROID) 137 MCG tablet TAKE 1 TABLET (137 MCG TOTAL) BY MOUTH DAILY BEFORE BREAKFAST. 90 tablet 3   losartan (COZAAR) 50 MG tablet TAKE 1 TABLET (50 MG TOTAL) BY MOUTH DAILY. 90 tablet 2   metFORMIN (GLUCOPHAGE-XR) 500 MG 24 hr tablet TAKE 4 TABLETS BY MOUTH DAILY WITH BREAKFAST 360 tablet 3   metoprolol tartrate (LOPRESSOR) 50 MG tablet TAKE 1 TABLET BY MOUTH TWO TIMES DAILY 180 tablet 1   Multiple Vitamins-Minerals (MULTIVITAMIN WITH MINERALS) tablet Take 1 tablet by mouth daily. Woman's Gummies     nitroGLYCERIN (NITROSTAT) 0.4 MG SL tablet Place 1 tablet (0.4 mg total) under the tongue every 5 (five) minutes as needed for chest pain. 25 tablet prn   pantoprazole (PROTONIX) 40 MG tablet TAKE 1 TABLET BY MOUTH DAILY. 90 tablet 0  pregabalin (LYRICA) 75 MG capsule TAKE 1 CAPSULE (75 MG TOTAL) BY MOUTH 2 (TWO) TIMES DAILY. 180 capsule 3   rosuvastatin (CRESTOR) 20 MG tablet TAKE 1 TABLET BY MOUTH DAILY. 90 tablet 3   Semaglutide, 1 MG/DOSE, 4 MG/3ML SOPN Inject 1 mg as directed once a week. 9 mL 3   tamoxifen (NOLVADEX) 20 MG tablet TAKE 1 TABLET BY MOUTH DAILY. 90 tablet 3   ondansetron (ZOFRAN) 4 MG tablet Take 1 tablet (4 mg total) by mouth every 8 (eight) hours as needed for nausea or vomiting. (Patient not taking: Reported on 10/18/2020) 20 tablet 0   No current facility-administered medications for this visit.     Allergies:   Penicillins, Clindamycin/lincomycin, Ciprofloxacin, and Ace inhibitors   Social History:  The patient  reports that she quit  smoking about 13 years ago. Her smoking use included cigarettes. She smoked an average of .5 packs per day. She has never used smokeless tobacco. She reports that she does not currently use alcohol. She reports that she does not use drugs.   Family History:   family history includes Alcoholism in her father; Bipolar disorder in her father; CAD (age of onset: 36) in her mother; CAD (age of onset: 53) in her maternal grandfather; COPD in her father; Cancer in an other family member; Dementia in her father; Depression in her father; Emphysema in her father; Fibroids in an other family member; Berenice Primas' disease in her mother; Heart attack (age of onset: 68) in her mother; Heart attack (age of onset: 48) in her sister; Heart attack (age of onset: 8) in her maternal grandfather; Heart disease in her father, maternal grandfather, mother, and sister; Heart disease (age of onset: 9) in her brother; Hypertension in her father, mother, and another family member; Kidney cancer in her cousin; Lung cancer in an other family member; Other (age of onset: 27) in her sister; Prostate cancer in her brother; Thyroid disease in her mother; Thyroid nodules (age of onset: 55) in her brother.    Review of Systems: Review of Systems  Constitutional: Negative.   HENT: Negative.    Respiratory: Negative.    Cardiovascular: Negative.   Gastrointestinal: Negative.   Musculoskeletal: Negative.   Neurological: Negative.   Psychiatric/Behavioral: Negative.    All other systems reviewed and are negative.   PHYSICAL EXAM: VS:  BP 138/80 (BP Location: Left Arm, Patient Position: Sitting, Cuff Size: Normal)   Pulse 94   Ht _0  (1.626 m)   Wt 218 lb 2 oz (98.9 kg)   LMP 12/23/2012   SpO2 98%   BMI 37.44 kg/m  , BMI Body mass index is 37.44 kg/m. GEN: Well nourished, well developed, in no acute distress HEENT: normal Neck: no JVD, carotid bruits, or masses Cardiac: RRR; no murmurs, rubs, or gallops,no edema   Respiratory:  clear to auscultation bilaterally, normal work of breathing GI: soft, nontender, nondistended, + BS MS: no deformity or atrophy Skin: warm and dry, no rash Neuro:  Strength and sensation are intact Psych: euthymic mood, full affect   Recent Labs: 02/03/2020: TSH 0.41 09/16/2020: ALT 14; BUN 17; Creatinine 0.75; Hemoglobin 12.8; Platelet Count 230; Potassium 4.2; Sodium 139    Lipid Panel Lab Results  Component Value Date   CHOL 121 02/03/2020   HDL 51.80 02/03/2020   LDLCALC 46 02/03/2020   TRIG 115.0 02/03/2020      Wt Readings from Last 3 Encounters:  10/18/20 218 lb 2 oz (98.9 kg)  09/29/20 218  lb 14.7 oz (99.3 kg)  09/16/20 221 lb 1.6 oz (100.3 kg)       ASSESSMENT AND PLAN:  Problem List Items Addressed This Visit       Cardiology Problems   Coronary artery disease of native artery of native heart with stable angina pectoris ([place])   Other Visit Diagnoses     Palpitations    -  Primary   Relevant Orders   EKG 12-Lead   LONG TERM MONITOR (3-14 DAYS)   Dizziness       Relevant Orders   EKG 12-Lead   LONG TERM MONITOR (3-14 DAYS)   Irregular heart beats       Relevant Orders   EKG 12-Lead   LONG TERM MONITOR (3-14 DAYS)      Coronary artery disease with stable angina Stressed importance of aggressive diabetes control Cholesterol at goal Symptoms less likely angina  Paroxysmal tachycardia Concerning for arrhythmia, unable to exclude atrial tachycardia, SVT or other Long discussion concerning versus treatment options, recommended a ZIO monitor May need higher dose metoprolol and follow-up Heart rate this morning 97-103 on her metoprolol tartrate 50 Plenty of room to increase the dosing after monitor is complete  Hyperlipidemia Cholesterol is at goal on the current lipid regimen. No changes to the medications were made.  Diabetes type 2 with complications We have encouraged continued exercise, careful diet management     Total  encounter time more than 35 minutes  Greater than 50% was spent in counseling and coordination of care with the patient   Signed, Esmond Plants, M.D., Ph.D. [place], [place]

## 2020-10-18 NOTE — Telephone Encounter (Signed)
Spoke with patient and given her persistent symptoms and concerns scheduled her in DOD slot for today. She verbalized understanding and will be here at that time.

## 2020-10-18 NOTE — Progress Notes (Signed)
Patient is a 55 year old female with PMH of left breast cancer s/p latissimus myocutaneous muscle flap performed by Dr. Marla Roe on 07/19/2020 who then developed fibrous tissue and loss of skin adjacent to where she was radiated.  Patient then went to the OR for debridement and ACell placement on 09/29/2020.  She was last seen here in clinic on 10/08/2020.  At that time, back wound looked very good and plan was for continued [place] dressing changes, Vaseline if needed.  The breast appear to be slowly filling in.  Today, she is doing well.  She reports that she has mild itching and "tightness" at the back incision, but reports that it is healed over and she is no longer applying the Xeroform and Mepilex border.  As for her breast wound, she is excited for when it closes and she no longer has to apply dressings.  Patient notes that she has been applying Adaptic dressed with K-Y jelly changed every other day.  She does state that she thinks it is getting better.  She denies any redness, worsening pain, purulent discharge, fevers, streaking, or other symptoms.  On physical exam, she still has a 2 x 1.5 x 0.25 cm lesion over left breast, lateral to the nipple.  No surrounding erythema or cellulitic findings.  Not malodorous.  No purulent drainage.  Healthy-appearing granular tissue noted around edges.  Back incision appears to be epithelialized.  Dry appearing.  No surrounding skin changes or induration.  Plan for continued Adaptic dressings with K-Y jelly changed every other day.  There has been development of good granular tissue, anticipate that in a couple of weeks we can transition her to collagen or endoform.  She receives her supplies from [facility].  Recommended Vaseline over her back incision as it is dry appearing.  Suspect this will help with her reported symptoms of "tightness" and itching.   Any pictures obtained of the patient and placed in the chart were with the patient's or guardian's  permission.

## 2020-10-18 NOTE — Patient Instructions (Addendum)
Medication Instructions:  No changes  Ok to take extra 1/2 or whole metoprolol as needed for palpitations  If you need a refill on your cardiac medications before your next appointment, please call your pharmacy.   Lab work: No new labs needed  Testing/Procedures: Heart monitor (Zio patch) for 2 weeks (14 days)   Your physician has recommended that you wear a Zio monitor. This monitor is a medical device that records the heart's electrical activity. Doctors most often use these monitors to diagnose arrhythmias. Arrhythmias are problems with the speed or rhythm of the heartbeat. The monitor is a small device applied to your chest. You can wear one while you do your normal daily activities. While wearing this monitor if you have any symptoms to push the button and record what you felt. Once you have worn this monitor for the period of time provider prescribed (Usually 14 days), you will return the monitor device in the postage paid box. Once it is returned they will download the data collected and provide [place] with a report which the provider will then review and we will call you with those results. Important tips:  Avoid showering during the first 24 hours of wearing the monitor. Avoid excessive sweating to help maximize wear time. Do not submerge the device, no hot tubs, and no swimming pools. Keep any lotions or oils away from the patch. After 24 hours you may shower with the patch on. Take brief showers with your back facing the shower head.  Do not remove patch once it has been placed because that will interrupt data and decrease adhesive wear time. Push the button when you have any symptoms and write down what you were feeling. Once you have completed wearing your monitor, remove and place into box which has postage paid and place in your outgoing mailbox.  If for some reason you have misplaced your box then call our office and we can provide another box and/or mail it off for  you.   Follow-Up: At [facility], you and your health needs are our priority.  As part of our continuing mission to provide you with exceptional heart care, we have created designated Provider Care Teams.  These Care Teams include your primary Cardiologist (physician) and Advanced Practice Providers (APPs -  Physician Assistants and Nurse Practitioners) who all work together to provide you with the care you need, when you need it.  You will need a follow up appointment  in one month with Dr. Saunders Revel   COVID-19 Vaccine Information can be found at: [email or web address].uk For questions related to vaccine distribution or appointments, please email [email or web address] or call [phone].

## 2020-10-18 NOTE — Telephone Encounter (Signed)
  Patient c/o Palpitations:  High priority if patient c/o lightheadedness, shortness of breath, or chest pain  How long have you had palpitations/irregular HR/ Afib? Are you having the symptoms now? Heart fluttering - yes  Are you currently experiencing lightheadedness, SOB or CP? Lightheadedness - some SOB - weakness  Do you have a history of afib (atrial fibrillation) or irregular heart rhythm? yes  Have you checked your BP or HR? (document readings if available): no  Are you experiencing any other symptoms? Weakness

## 2020-10-22 ENCOUNTER — Other Ambulatory Visit: Payer: Self-pay

## 2020-10-22 ENCOUNTER — Ambulatory Visit (INDEPENDENT_AMBULATORY_CARE_PROVIDER_SITE_OTHER): Payer: No Typology Code available for payment source | Admitting: Physician Assistant

## 2020-10-22 ENCOUNTER — Encounter: Payer: Self-pay | Admitting: Physician Assistant

## 2020-10-22 DIAGNOSIS — N6489 Other specified disorders of breast: Secondary | ICD-10-CM

## 2020-11-01 ENCOUNTER — Other Ambulatory Visit: Payer: Self-pay

## 2020-11-02 NOTE — Progress Notes (Signed)
Patient is a 55 year old female with PMH of left breast cancer s/p latissimus myocutaneous muscle flap performed by Dr. Marla Roe on 07/19/2020 who then developed fibrous tissue and loss of skin adjacent to where she was radiated.  Patient then went to the OR for debridement and ACell placement on 09/29/2020.  Patient was last seen here in clinic on 10/22/2020.  At that time, physical exam was reassuring.  Her back incision had healed entirely and the breast and was still requiring Adaptic dressing with K-Y jelly every other day.  The lesion was approximately 2 x 1.5 x 0.25 cm, left breast lateral to the nipple.  No infectious signs or symptoms.  Recommendation was for continued dressing changes.  Today, patient is doing well, no specific implants.  She has been doing the Adaptic dressing changes every other day.  Denies any fevers, redness, swelling, drainage, or worsening pain symptoms.  Physical exam is reassuring.  Shows progress since last encounter.  Lesion is now approximately 1.75 x 1.25 x 0.1 cm.  No cellulitic findings on exam.  Patient is pleased and has ample amounts of Adaptic at home.  Recommending continued management.  Follow-up in 3 weeks.  We also discussed Mederm[facility] and [place] for her healed back incision.  She reports that she had used topical vitamin E in the past and plans to apply that to her scar.  Picture(s) obtained of the patient and placed in the chart were with the patient's or guardian's permission.

## 2020-11-05 ENCOUNTER — Other Ambulatory Visit: Payer: Self-pay

## 2020-11-05 ENCOUNTER — Ambulatory Visit (INDEPENDENT_AMBULATORY_CARE_PROVIDER_SITE_OTHER): Payer: No Typology Code available for payment source | Admitting: Physician Assistant

## 2020-11-05 DIAGNOSIS — N6489 Other specified disorders of breast: Secondary | ICD-10-CM

## 2020-11-10 ENCOUNTER — Ambulatory Visit
Admission: RE | Admit: 2020-11-10 | Discharge: 2020-11-10 | Disposition: A | Discharge status: Home / Self Care | Payer: No Typology Code available for payment source | Source: Ambulatory Visit | Attending: Obstetrics and Gynecology | Admitting: Obstetrics and Gynecology

## 2020-11-10 ENCOUNTER — Other Ambulatory Visit: Payer: Self-pay

## 2020-11-10 DIAGNOSIS — Z8742 Personal history of other diseases of the female genital tract: Secondary | ICD-10-CM | POA: Diagnosis not present

## 2020-11-12 ENCOUNTER — Encounter: Payer: Self-pay | Admitting: Obstetrics and Gynecology

## 2020-11-12 ENCOUNTER — Other Ambulatory Visit: Payer: Self-pay

## 2020-11-12 MED FILL — Tamoxifen Citrate Tab 20 MG (Base Equivalent): ORAL | 90 days supply | Qty: 90 | Fill #1 | Status: AC

## 2020-11-24 ENCOUNTER — Encounter: Payer: Self-pay | Admitting: Internal Medicine

## 2020-11-24 ENCOUNTER — Other Ambulatory Visit: Payer: Self-pay

## 2020-11-24 ENCOUNTER — Ambulatory Visit (INDEPENDENT_AMBULATORY_CARE_PROVIDER_SITE_OTHER): Payer: No Typology Code available for payment source | Admitting: Internal Medicine

## 2020-11-24 VITALS — BP 130/80 | HR 94 | Ht 64.0 in | Wt 217.0 lb

## 2020-11-24 DIAGNOSIS — I10 Essential (primary) hypertension: Secondary | ICD-10-CM | POA: Diagnosis not present

## 2020-11-24 DIAGNOSIS — E1169 Type 2 diabetes mellitus with other specified complication: Secondary | ICD-10-CM | POA: Diagnosis not present

## 2020-11-24 DIAGNOSIS — I471 Supraventricular tachycardia: Secondary | ICD-10-CM | POA: Diagnosis not present

## 2020-11-24 DIAGNOSIS — I25118 Atherosclerotic heart disease of native coronary artery with other forms of angina pectoris: Secondary | ICD-10-CM

## 2020-11-24 DIAGNOSIS — E785 Hyperlipidemia, unspecified: Secondary | ICD-10-CM

## 2020-11-24 MED ORDER — METOPROLOL TARTRATE 100 MG PO TABS
100.0000 mg | ORAL_TABLET | Freq: Two times a day (BID) | ORAL | 2 refills | Status: DC
  Filled 2020-11-24: qty 180, 90d supply, fill #0
  Filled 2021-05-10: qty 180, 90d supply, fill #1
  Filled 2021-09-26: qty 180, 90d supply, fill #2

## 2020-11-24 NOTE — Progress Notes (Signed)
Patient is a 55 year old female with PMH of left breast cancer s/p latissimus myocutaneous muscle flap performed by Dr. Marla Roe on 07/19/2020 who then developed fibrous tissue and loss of skin adjacent to where she was radiated.  Patient then went to the OR for debridement and ACell placement on 09/29/2020.   Patient was last seen here in clinic on 11/05/2020.  At that time, she was doing well without any complaints.  She changes Adaptic dressings every other day.  Physical exam showed mild progression in her wound healing, 1.75 x 1.25 x 0.1 cm.  Plan is for continued management and follow-up in 3 weeks.  Today she is doing really well, has no complaints.  She is planning to go to the [place] today with her friend for motorcycle trip.   On exam left breast wound has completely epithelialized.  There is no cellulitic changes.  There is a little bit of surrounding irritation but no signs of infection on exam.  She does have some fullness in the left lateral breast, this feels consistent with the latissimus muscle.  I do not appreciate any fluid collection with palpation.  The latissimus myocutaneous muscle flap skin paddle has good color and capillary refill.  We discussed following up on an as-needed basis, patient felt very comfortable with this.  She will call us with any questions or concerns.  Picture was taken and placed in the patient's chart with patient's permission.  Recommend calling with questions or concerns.

## 2020-11-24 NOTE — Progress Notes (Signed)
Follow-up Outpatient Visit Date: 11/24/2020  Primary Care Provider: Jearld Fenton, NP [place] [address]  Chief Complaint: Follow-up palpitations and coronary artery disease  HPI:  Stacey Roberts is a 55 y.o. female with history of coronary artery disease with 80% mid RCA stenosis being managed medically, hypertension, hyperlipidemia, type 2 diabetes mellitus, prior tobacco use, and breast cancer, who presents for follow-up of coronary artery disease and palpitations.  She was last seen in our office by Dr. Rockey Situ in late September, at which time she complained of worsening palpitations associated with dyspnea, dizziness, chest tightness, and generalized malaise.  14-day event monitor was placed.  Patient was advised to take an additional 25 mg of metoprolol as needed for palpitations.  Preliminary review of event monitor showed multiple episodes of PSVT (likely atrial tachycardia) lasting up to 11 beats with a maximum rate of 226 bpm.  Today, Stacey Roberts reports she has been feeling fairly well.  She still has sporadic palpitations that happen about once or twice a week.  She notices her heart begins to race with associated lightheadedness and dyspnea.  She also feels like her head is in a "scuba bubble" during these episodes.  They resolve spontaneously after a few seconds.  She has not had any significant chest pain and is remaining fairly active without exertional symptoms.  She is tolerating her medications well and has not taken any additional metoprolol beyond her standing dose of 50 mg twice daily.  --------------------------------------------------------------------------------------------------  Past Medical History:  Diagnosis Date   Allergy    seasonal   BRCA negative 2017   Breast cancer ([facility]) 2014   Breast cancer ([facility]) 2017   Bronchitis    hx of   Coronary artery disease 06/2019   80% mid RCA stenosis - medical management   Diabetes mellitus, type II ([facility])     Endometrial hyperplasia 2014   Mirena placed; Dr. 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 Buddy Duty   Past Surgical History:  Procedure Laterality Date   APPLICATION OF A-CELL OF EXTREMITY Left 09/29/2020   Procedure: placement of ACell;  Surgeon: Wallace Going, DO;  Location: [place];  Service: Plastics;  Laterality: Left;   BREAST BIOPSY  2000   s/p   BREAST BIOPSY Left 01/22/2013   Procedure: RE-EXCISION LEFT BREAST DCIS;  Surgeon: Harl Bowie, MD;  Location: [place];  Service: General;  Laterality: Left;   BREAST LUMPECTOMY Left 01/06/2013   Procedure: LUMPECTOMY;  Surgeon: Harl Bowie, MD;  Location: [place];  Service: General;  Laterality: Left;   BREAST LUMPECTOMY Left 2017   BREAST LUMPECTOMY WITH NEEDLE LOCALIZATION AND AXILLARY SENTINEL LYMPH NODE BX Left 09/30/2015   Procedure: Left BREAST LUMPECTOMY WITH NEEDLE LOCALIZATION AND AXILLARY SENTINEL LYMPH  NODE BX;  Surgeon: Coralie Keens, MD;  Location: [place];  Service: General;  Laterality: Left;   BREAST RECONSTRUCTION Bilateral 10/12/2015   Procedure: BREAST oncoplastic RECONSTRUCTION;  Surgeon: Irene Limbo, MD;  Location: [place];  Service: Plastics;  Laterality: Bilateral;   BREAST REDUCTION SURGERY Bilateral 10/12/2015   Procedure: MAMMARY REDUCTION  (BREAST);  Surgeon: Irene Limbo, MD;   Location: [place];  Service: Plastics;  Laterality: Bilateral;   CORONARY ANGIOPLASTY     DILATION AND CURETTAGE OF UTERUS  2004   s/p   EXCISION OF BREAST LESION Left 10/12/2015   Procedure: Re-EXCISION OF BREAST;  Surgeon: Coralie Keens, MD;  Location: [place];  Service: General;  Laterality: Left;   INCISION AND DRAINAGE OF WOUND Left 09/29/2020   Procedure: Excision of back and left breast wound;  Surgeon: Wallace Going, DO;  Location: [place];  Service: Plastics;  Laterality: Left;  1 hour   LATISSIMUS FLAP TO BREAST Left 07/19/2020   Procedure: Left breast radiation necrosis excision with latissimus muscle flap;  Surgeon: Wallace Going, DO;  Location: [place];  Service: Plastics;  Laterality: Left;  3 hours   LEFT HEART CATH AND CORONARY ANGIOGRAPHY N/A 06/27/2019   Procedure: LEFT HEART CATH AND CORONARY ANGIOGRAPHY;  Surgeon: Nelva Bush, MD;  Location: [place] [facility];  Service: Cardiovascular;  Laterality: N/A;   REDUCTION MAMMAPLASTY Bilateral 09/2015   THYROIDECTOMY  02/2009   Dr Harlow Asa   TONSILLECTOMY  1982   TONSILLECTOMY      Current Meds  Medication Sig   aspirin EC 81 MG tablet Take 1 tablet (81 mg total) by mouth daily.   Blood Glucose Monitoring Suppl (FREESTYLE LITE) DEVI 1 each by Does not apply route 2 (two) times daily. [name]   dapagliflozin propanediol (FARXIGA) 5 MG TABS tablet TAKE 1 TABLET BY MOUTH DAILY.   gabapentin (NEURONTIN) 300 MG capsule Take 600 mg by mouth at bedtime as needed.   glucose blood (FREESTYLE LITE) test strip Use 2 (two) times daily. (dx [name])   insulin glargine-yfgn (SEMGLEE) 100 UNIT/ML Pen Inject 45 Units into the skin every morning.   Insulin Pen Needle (UNIFINE PENTIPS) 31G X 8 MM MISC use to inject twice a day   Lancets (FREESTYLE) lancets 1 each by Other route 2 (two) times daily. [name]   levothyroxine (SYNTHROID) 137 MCG tablet TAKE 1 TABLET (137 MCG TOTAL)  BY MOUTH DAILY BEFORE BREAKFAST.   losartan (COZAAR) 50 MG tablet TAKE 1 TABLET (50 MG TOTAL) BY MOUTH DAILY.   metFORMIN (GLUCOPHAGE-XR) 500 MG 24 hr tablet TAKE 4 TABLETS BY MOUTH DAILY WITH BREAKFAST   Multiple Vitamins-Minerals (MULTIVITAMIN WITH MINERALS) tablet Take 1 tablet by mouth daily. Woman's Gummies   nitroGLYCERIN (NITROSTAT) 0.4 MG SL tablet Place 1 tablet (0.4 mg total) under the tongue every 5 (five) minutes as needed for chest pain.   pantoprazole (PROTONIX) 40 MG tablet TAKE 1 TABLET BY MOUTH DAILY.   pregabalin (LYRICA) 75 MG capsule Take 75 mg by mouth daily.   rosuvastatin (CRESTOR) 20 MG tablet TAKE 1 TABLET BY MOUTH DAILY.   Semaglutide, 1 MG/DOSE, 4 MG/3ML SOPN Inject 1 mg as directed once a week.   tamoxifen (NOLVADEX) 20 MG tablet TAKE 1 TABLET BY MOUTH DAILY.   [DISCONTINUED] metoprolol tartrate (LOPRESSOR) 50 MG tablet TAKE 1 TABLET BY MOUTH TWO TIMES DAILY    Allergies: Penicillins, Clindamycin/lincomycin, Ciprofloxacin, and Ace inhibitors  Social History  Tobacco Use   Smoking status: Former    Packs/day: 0.50    Types: Cigarettes    Quit date: 09/29/2007    Years since quitting: 13.1   Smokeless tobacco: Never   Tobacco comments:    Smoked on and off for 20 years. Quit about 8 years ago (as of 08/2015)  Vaping Use   Vaping Use: Never used  Substance Use Topics   Alcohol use: Not Currently    Alcohol/week: 0.0 standard drinks   Drug use: No    Family History  Problem Relation Age of Onset   CAD Mother 62       Died age 69 of CAD   Heart disease Mother        CABG x 2   Graves' disease Mother    Hypertension Mother    Thyroid disease Mother    Heart attack Mother 29   Dementia Father        deceased age 62 secondary to dementia   Bipolar disorder Father    Emphysema Father    Heart disease Father    COPD Father        smoker   Hypertension Father    Depression Father    Alcoholism Father    Other Sister 50       hysterectomy for  fibroids   Heart disease Sister        CABG x 2   Heart attack Sister 88   Prostate cancer Brother        low grade; w/ surveillance   Thyroid nodules Brother 54   Heart disease Brother 66       CABG x 4   CAD Maternal Grandfather 84   Heart disease Maternal Grandfather    Heart attack Maternal Grandfather 103   Kidney cancer Cousin        maternal 1st cousin dx 81-47; former smoker   Fibroids Other        niece dx approx 42   Hypertension Other    Lung cancer Other        maternal great aunt (MGM's sister); not a smoker   Cancer Other        nephew dx neuroblastoma at 9.4 years old   Colon cancer Neg Hx    Colon polyps Neg Hx     Review of Systems: A 12-system review of systems was performed and was negative except as noted in the HPI.  --------------------------------------------------------------------------------------------------  Physical Exam: BP 130/80 (BP Location: Left Arm, Patient Position: Sitting, Cuff Size: Large)   Pulse 94   Ht _0  (1.626 m)   Wt 217 lb (98.4 kg)   LMP 12/23/2012   SpO2 98%   BMI 37.25 kg/m   General:  NAD. Neck: No JVD or HJR. Lungs: Clear to auscultation bilaterally without wheezes or crackles. Heart: Regular rate and rhythm without murmurs, rubs, or gallops. Abdomen: Soft, nontender, nondistended. Extremities: No lower extremity edema.   Lab Results  Component Value Date   WBC 7.9 09/16/2020   HGB 12.8 09/16/2020   HCT 40.7 09/16/2020   MCV 84.8 09/16/2020   PLT 230 09/16/2020    Lab Results  Component Value Date   NA 139 09/16/2020   K 4.2 09/16/2020   CL 105 09/16/2020   CO2 25 09/16/2020   BUN 17 09/16/2020   CREATININE 0.75 09/16/2020   GLUCOSE 225 (H) 09/16/2020   ALT 14 09/16/2020    Lab Results  Component Value Date  CHOL 121 02/03/2020   HDL 51.80 02/03/2020   LDLCALC 46 02/03/2020   LDLDIRECT 153.0 09/01/2014   TRIG 115.0 02/03/2020   CHOLHDL 2 02/03/2020     --------------------------------------------------------------------------------------------------  ASSESSMENT AND PLAN: PSVT: Recent event monitor showed short atrial runs consistent with PSVT/atrial tachycardia.  Given relatively infrequent, self-limited episodes, I think it is reasonable to continue with medical therapy.  We have agreed to increase metoprolol to tartrate to 100 mg twice daily.  Coronary artery disease with stable angina: Minimal chest discomfort reported since last visit.  Mostly, she has dyspnea associated with aforementioned palpitations.  We discussed the nature of her severe single-vessel CAD and the role for medical therapy versus PCI.  We have agreed to increase metoprolol, as above, and defer intervention.  Continue current regimen of aspirin and rosuvastatin as well.  Hyperlipidemia associated with type 2 diabetes mellitus: LDL well controlled on last check in January.  Continue rosuvastatin 20 mg daily and ongoing diabetes management per PCP.  Hypertension: Blood pressure borderline elevated today (goal less than 130/80).  Increase metoprolol tartrate to 100 mg twice daily.  Continue current dose of losartan.  Follow-up: Return to clinic in 6 months.  Nelva Bush, MD 11/24/2020 10:45 AM

## 2020-11-24 NOTE — Patient Instructions (Signed)
Medication Instructions:   Your physician has recommended you make the following change in your medication:   INCREASE Metoprolol Tartrate 100 mg TWICE daily   *If you need a refill on your cardiac medications before your next appointment, please call your pharmacy*   Lab Work:  None ordered  Testing/Procedures:  None ordered   Follow-Up: At [facility], you and your health needs are our priority.  As part of our continuing mission to provide you with exceptional heart care, we have created designated Provider Care Teams.  These Care Teams include your primary Cardiologist (physician) and Advanced Practice Providers (APPs -  Physician Assistants and Nurse Practitioners) who all work together to provide you with the care you need, when you need it.  We recommend signing up for the patient portal called "MyChart".  Sign up information is provided on this After Visit Summary.  MyChart is used to connect with patients for Virtual Visits (Telemedicine).  Patients are able to view lab/test results, encounter notes, upcoming appointments, etc.  Non-urgent messages can be sent to your provider as well.   To learn more about what you can do with MyChart, go to [email or web address].    Your next appointment:   6 month(s)  The format for your next appointment:   In Person  Provider:   You may see Dr. Harrell Gave End or one of the following Advanced Practice Providers on your designated Care Team:   Murray Hodgkins, NP Christell Faith, PA-C Marrianne Mood, PA-C Cadence Thaxton, [place]

## 2020-11-25 ENCOUNTER — Encounter: Payer: Self-pay | Admitting: Obstetrics and Gynecology

## 2020-11-25 ENCOUNTER — Other Ambulatory Visit: Payer: Self-pay

## 2020-11-25 ENCOUNTER — Ambulatory Visit (INDEPENDENT_AMBULATORY_CARE_PROVIDER_SITE_OTHER): Payer: No Typology Code available for payment source | Admitting: Obstetrics and Gynecology

## 2020-11-25 VITALS — BP 135/88 | Ht 64.0 in | Wt 217.0 lb

## 2020-11-25 DIAGNOSIS — N951 Menopausal and female climacteric states: Secondary | ICD-10-CM

## 2020-11-25 NOTE — Progress Notes (Signed)
Gynecology Ultrasound Follow Up  Chief Complaint:  Chief Complaint  Patient presents with   Consult    Discuss Hysterectomy - RM 3     History of Present Illness: Patient is a 55 y.o. female who presents today for ultrasound evaluation of .endometrial stripe  Ultrasound demonstrates the following findgins Adnexa: normal adnexa bilateral Uterus: Normal uterus, wiith endometrial stripe of 7.13m Additional: no free fluid  No abnormal uterine bleeding.  Review of Systems: Review of Systems  Constitutional: Negative.   Gastrointestinal: Negative.   Genitourinary: Negative.    Past Medical History:  Past Medical History:  Diagnosis Date   Allergy    seasonal   BRCA negative 2017   [facility] [place]   [place] [place]   Bronchitis    hx of   Coronary artery disease 06/2019   80% mid RCA stenosis - medical management   Diabetes mellitus, type II ([facility]    Endometrial hyperplasia 2014   Mirena placed; Dr. 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 KBuddy Duty   Past Surgical History:  Past Surgical History:  Procedure Laterality Date   APPLICATION OF A-CELL OF EXTREMITY Left 09/29/2020   Procedure: placement of ACell;  Surgeon: DWallace Going DO;  [place]  Service: Plastics;  Laterality: Left;   BREAST BIOPSY  2000   s/p   BREAST BIOPSY Left 01/22/2013   Procedure:  RE-EXCISION LEFT BREAST DCIS;  Surgeon: DHarl Bowie MD;  [place]  Service: General;  Laterality: Left;   BREAST LUMPECTOMY Left 01/06/2013   Procedure: LUMPECTOMY;  Surgeon: DHarl Bowie MD;  [place]  Service: General;  Laterality: Left;   BREAST LUMPECTOMY Left 2017   BREAST LUMPECTOMY WITH NEEDLE LOCALIZATION AND AXILLARY SENTINEL LYMPH NODE BX Left 09/30/2015   Procedure: Left BREAST LUMPECTOMY WITH NEEDLE LOCALIZATION AND AXILLARY SENTINEL LYMPH NODE BX;  Surgeon: DCoralie Keens MD;  [place]  Service: General;  Laterality: Left;   BREAST RECONSTRUCTION Bilateral 10/12/2015   Procedure: BREAST oncoplastic RECONSTRUCTION;  Surgeon: BIrene Limbo MD;  [place] [facility]  Service: Plastics;  Laterality: Bilateral;   BREAST REDUCTION SURGERY Bilateral 10/12/2015   Procedure: MAMMARY REDUCTION  (BREAST);  Surgeon: BIrene Limbo MD;  [place]  Service: Plastics;  Laterality: Bilateral;   CORONARY ANGIOPLASTY     DILATION AND CURETTAGE OF UTERUS  2004   s/p   EXCISION OF BREAST LESION Left 10/12/2015   Procedure: Re-EXCISION OF BREAST;  Surgeon: DCoralie Keens MD;  [place] [facility]  Service: General;  Laterality: Left;   INCISION AND DRAINAGE OF WOUND Left 09/29/2020   Procedure: Excision of back and left breast wound;  Surgeon: DWallace Going DO;  Location: [place];  Service: Plastics;  Laterality: Left;  1 hour   LATISSIMUS FLAP TO BREAST Left 07/19/2020   Procedure: Left breast radiation necrosis excision with latissimus muscle flap;  Surgeon: Wallace Going, DO;  Location: [place];  Service: Plastics;  Laterality: Left;  3 hours   LEFT HEART CATH AND CORONARY ANGIOGRAPHY N/A 06/27/2019   Procedure: LEFT HEART CATH AND CORONARY ANGIOGRAPHY;  Surgeon: Nelva Bush, MD;  Location: [place] [facility];  Service: Cardiovascular;   Laterality: N/A;   REDUCTION MAMMAPLASTY Bilateral 09/2015   THYROIDECTOMY  02/2009   Dr Harlow Asa   TONSILLECTOMY  1982   TONSILLECTOMY      Gynecologic History:  Patient's last menstrual period was 12/23/2012. Pap 10/18/2017 NILM HPV negative  Family History:  Family History  Problem Relation Age of Onset   CAD Mother 68       Died age 45 of CAD   Heart disease Mother        CABG x 2   Graves' disease Mother    Hypertension Mother    Thyroid disease Mother    Heart attack Mother 51   Dementia Father        deceased age 65 secondary to dementia   Bipolar disorder Father    Emphysema Father    Heart disease Father    COPD Father        smoker   Hypertension Father    Depression Father    Alcoholism Father    Other Sister 6       hysterectomy for fibroids   Heart disease Sister        CABG x 2   Heart attack Sister 33   Prostate cancer Brother        low grade; w/ surveillance   Thyroid nodules Brother 32   Heart disease Brother 91       CABG x 4   CAD Maternal Grandfather 71   Heart disease Maternal Grandfather    Heart attack Maternal Grandfather 64   Kidney cancer Cousin        maternal 1st cousin dx 76-47; former smoker   Fibroids Other        niece dx approx 86   Hypertension Other    Lung cancer Other        maternal great aunt (MGM's sister); not a smoker   Cancer Other        nephew dx neuroblastoma at 72.37 years old   Colon cancer Neg Hx    Colon polyps Neg Hx     Social History:  Social History   Socioeconomic History   Marital status: Single    Spouse name: Not on file   Number of children: Not on file   Years of education: Not on file   Highest education level: Not on file  Occupational History    Employer: [employer]    Comment: Outpatient scheduling in radiology  Tobacco Use   Smoking status: Former    Packs/day: 0.50    Types: Cigarettes    Quit date: 09/29/2007    Years since quitting: 13.1   Smokeless tobacco: Never   Tobacco  comments:    Smoked on and off for 20 years. Quit about 8 years ago (as of 08/2015)  Vaping Use   Vaping Use: Never used  Substance and Sexual Activity   Alcohol use: Not Currently    Alcohol/week: 0.0 standard drinks   Drug use: No  Sexual activity: Not Currently  Other Topics Concern   Not on file  Social History Narrative   The patient is divorced, moved to [place] in 2006 from [place].  She does not have any children, currently liveswith her boyfriend and is working as a Nurse, adult for [employer].  No alcohol.  Tobacco use:  She quit 5 years ago.  She smoked onand off for approximately 20 years.  No history of recreational drugUse. Drinks two cups of coffee per work day.    Social Determinants of Health   Financial Resource Strain: Not on file  Food Insecurity: Not on file  Transportation Needs: Not on file  Physical Activity: Not on file  Stress: Not on file  Social Connections: Not on file  Intimate Partner Violence: Not on file    Allergies:  Allergies  Allergen Reactions   Penicillins Shortness Of Breath and Rash    Has patient had a PCN reaction causing immediate rash, facial/tongue/throat swelling, SOB or lightheadedness with hypotension: Yes Has patient had a PCN reaction causing severe rash involving mucus membranes or skin necrosis: Yes Has patient had a PCN reaction that required hospitalization No Has patient had a PCN reaction occurring within the last 10 years: Yes If all of the above answers are "NO", then may proceed with Cephalosporin use.    Clindamycin/Lincomycin Other (See Comments)    Severe stomach cramps   Ciprofloxacin Itching   Ace Inhibitors Cough    REACTION: cough; denies airway involvement     Medications: Prior to Admission medications   Medication Sig Start Date End Date Taking? Authorizing Provider  aspirin EC 81 MG tablet Take 1 tablet (81 mg total) by mouth daily. 05/23/19   End, Harrell Gave, MD  Blood Glucose  Monitoring Suppl (FREESTYLE LITE) DEVI 1 each by Does not apply route 2 (two) times daily. [name] 02/03/19   Renato Shin, MD  dapagliflozin propanediol (FARXIGA) 5 MG TABS tablet TAKE 1 TABLET BY MOUTH DAILY. 04/14/20 04/14/21  Renato Shin, MD  gabapentin (NEURONTIN) 300 MG capsule Take 600 mg by mouth at bedtime as needed.    [provider]  glucose blood (FREESTYLE LITE) test strip Use 2 (two) times daily. (dx [name]) 10/07/20   Renato Shin, MD  insulin glargine-yfgn (SEMGLEE) 100 UNIT/ML Pen Inject 45 Units into the skin every morning. 09/07/20   Renato Shin, MD  Insulin Pen Needle (UNIFINE PENTIPS) 31G X 8 MM MISC use to inject twice a day 06/10/20   Renato Shin, MD  Lancets (FREESTYLE) lancets 1 each by Other route 2 (two) times daily. [name] 02/03/19   Renato Shin, MD  levothyroxine (SYNTHROID) 137 MCG tablet TAKE 1 TABLET (137 MCG TOTAL) BY MOUTH DAILY BEFORE BREAKFAST. 04/08/20 04/08/21  Renato Shin, MD  losartan (COZAAR) 50 MG tablet TAKE 1 TABLET (50 MG TOTAL) BY MOUTH DAILY. 02/19/20 02/18/21  Jearld Fenton, NP  metFORMIN (GLUCOPHAGE-XR) 500 MG 24 hr tablet TAKE 4 TABLETS BY MOUTH DAILY WITH BREAKFAST 09/03/20 09/03/21  Renato Shin, MD  metoprolol tartrate (LOPRESSOR) 100 MG tablet Take 1 tablet (100 mg total) by mouth 2 (two) times daily. 11/24/20 08/21/21  End, Harrell Gave, MD  Multiple Vitamins-Minerals (MULTIVITAMIN WITH MINERALS) tablet Take 1 tablet by mouth daily. Woman's Gummies    [provider]  nitroGLYCERIN (NITROSTAT) 0.4 MG SL tablet Place 1 tablet (0.4 mg total) under the tongue every 5 (five) minutes as needed for chest pain. 06/27/19   End, Harrell Gave, MD  pantoprazole ([place]) 40  MG tablet TAKE 1 TABLET BY MOUTH DAILY. 10/15/20 10/15/21  Jearld Fenton, NP  pregabalin (LYRICA) 75 MG capsule Take 75 mg by mouth daily.    [provider]  rosuvastatin (CRESTOR) 20 MG tablet TAKE 1 TABLET BY MOUTH DAILY. 07/01/20 07/01/21  Dutch Quint B, FNP   Semaglutide, 1 MG/DOSE, 4 MG/3ML SOPN Inject 1 mg as directed once a week. 09/07/20   Renato Shin, MD  tamoxifen (NOLVADEX) 20 MG tablet TAKE 1 TABLET BY MOUTH DAILY. 11/13/19 02/14/21  Magrinat, Virgie Dad, MD    Physical Exam Vitals: Blood pressure 135/88, height '5\' 4"'  (1.626 m), weight 217 lb (98.4 kg), last menstrual period 12/23/2012.  General: NAD HEENT: normocephalic, anicteric Pulmonary: No increased work of breathing Neurologic: Grossly intact, normal gait Psychiatric: mood appropriate, affect full   Assessment: 55 y.o. evaluation of endometrial    Plan: Problem List Items Addressed This Visit   None Visit Diagnoses     Vasomotor symptoms due to menopause    -  Primary   Relevant Orders   FSH   Estradiol   FSH (Completed)   Estradiol (Completed)       1) vasomotor symptoms - will check FHS/estradiol - will confer with her oncologist  2) An endometrial measurement greater than 4 mm that is incidentally discovered in a postmenopausal patient without bleeding need not routinely trigger evaluation, although an individualized assessment based on patient characteristics and risk factors is appropriate. Thus, transvaginal ultrasonography is not an appropriate screening tool for endometrial cancer in postmenopausal women without bleeding.    ACOG Committee Opinion 734, May 2018 "The Role of Transvaginal Ultrasonography in Evaluating the Endometrium of Women with Postmenopausal Bleeding"    Premenopausal women treated with tamoxifen have no known increased risk of uterine cancer and require no additional monitoring beyond routine gynecologic care ("Tamoxifen and Uterine Cancer" ACOG Committee Opinion Number 601 June 2014 and reaffirmed 2019)  - there is no need for routine endometrial stripe measurements in patient's on tamoxifen  3) Return for follow up labs sometime in January.    Malachy Mood, MD, [facility], [facility] 11/25/2020,  2:17 PM

## 2020-11-26 ENCOUNTER — Other Ambulatory Visit: Payer: Self-pay

## 2020-11-26 ENCOUNTER — Ambulatory Visit (INDEPENDENT_AMBULATORY_CARE_PROVIDER_SITE_OTHER): Payer: No Typology Code available for payment source | Admitting: Surgical

## 2020-11-26 DIAGNOSIS — Z923 Personal history of irradiation: Secondary | ICD-10-CM

## 2020-11-26 DIAGNOSIS — N6489 Other specified disorders of breast: Secondary | ICD-10-CM

## 2020-11-26 LAB — FOLLICLE STIMULATING HORMONE: FSH: 9.6 m[IU]/mL

## 2020-11-26 LAB — ESTRADIOL: Estradiol: <5 pg/mL

## 2020-12-01 ENCOUNTER — Other Ambulatory Visit: Payer: Self-pay | Admitting: Internal Medicine

## 2020-12-01 ENCOUNTER — Other Ambulatory Visit: Payer: Self-pay

## 2020-12-01 MED FILL — Dapagliflozin Propanediol Tab 5 MG (Base Equivalent): ORAL | 90 days supply | Qty: 90 | Fill #2 | Status: AC

## 2020-12-01 NOTE — Telephone Encounter (Signed)
Requested medication (s) are due for refill today: Yes  Requested medication (s) are on the active medication list: Yes  Last refill:  02/19/20 #90/2 RF  Future visit scheduled: No  Notes to clinic:  Unable to refill per protocol, appointment needed.      Requested Prescriptions  Pending Prescriptions Disp Refills   losartan (COZAAR) 50 MG tablet 90 tablet 2    Sig: TAKE 1 TABLET (50 MG TOTAL) BY MOUTH DAILY.     Cardiovascular:  Angiotensin Receptor Blockers Failed - 12/01/2020  4:25 PM      Failed - Valid encounter within last 6 months    Recent Outpatient Visits   None            Passed - Cr in normal range and within 180 days    Creatinine  Date Value Ref Range Status  09/16/2020 0.75 0.44 - 1.00 mg/dL Final  02/18/2013 0.8 0.6 - 1.1 mg/dL Final   Creatinine,U  Date Value Ref Range Status  04/02/2015 79.7 mg/dL Final          Passed - K in normal range and within 180 days    Potassium  Date Value Ref Range Status  09/16/2020 4.2 3.5 - 5.1 mmol/L Final  02/18/2013 4.4 3.5 - 5.1 mEq/L Final          Passed - Patient is not pregnant      Passed - Last BP in normal range    BP Readings from Last 1 Encounters:  11/25/20 135/88

## 2020-12-02 ENCOUNTER — Other Ambulatory Visit: Payer: Self-pay | Admitting: Internal Medicine

## 2020-12-02 ENCOUNTER — Other Ambulatory Visit: Payer: Self-pay

## 2020-12-02 MED ORDER — LOSARTAN POTASSIUM 50 MG PO TABS
ORAL_TABLET | Freq: Every day | ORAL | 2 refills | Status: DC
  Filled 2020-12-02: qty 30, 30d supply, fill #0
  Filled 2021-01-10: qty 30, 30d supply, fill #1
  Filled 2021-02-04: qty 30, 30d supply, fill #2

## 2020-12-02 NOTE — Telephone Encounter (Signed)
Courtesy refill until appt on 02/07/21  Requested Prescriptions  Pending Prescriptions Disp Refills  . losartan (COZAAR) 50 MG tablet 30 tablet 2    Sig: TAKE 1 TABLET (50 MG TOTAL) BY MOUTH DAILY.     Cardiovascular:  Angiotensin Receptor Blockers Failed - 12/02/2020 10:15 AM      Failed - Valid encounter within last 6 months    Recent Outpatient Visits   None     Future Appointments            In 2 months Baity, Coralie Keens, NP [facility], [place] in normal range and within 180 days    Creatinine  Date Value Ref Range Status  09/16/2020 0.75 0.44 - 1.00 mg/dL Final  02/18/2013 0.8 0.6 - 1.1 mg/dL Final   Creatinine,U  Date Value Ref Range Status  04/02/2015 79.7 mg/dL Final         Passed - K in normal range and within 180 days    Potassium  Date Value Ref Range Status  09/16/2020 4.2 3.5 - 5.1 mmol/L Final  02/18/2013 4.4 3.5 - 5.1 mEq/L Final         Passed - Patient is not pregnant      Passed - Last BP in normal range    BP Readings from Last 1 Encounters:  11/25/20 135/88

## 2020-12-02 NOTE — Telephone Encounter (Signed)
Medication Refill - Medication:  losartan (COZAAR) 50 MG tablet   Has the patient contacted their pharmacy? Yes.   Refused, contact PCP  Preferred Pharmacy (with phone number or street name):  [place]  Phone:  [phone] Fax:  [phone]  Has the patient been seen for an appointment in the last year OR does the patient have an upcoming appointment? Yes.    *Pt does have appt on 01/13, pt requested if she could get a refill to hold her until*  Agent: Please be advised that RX refills may take up to 3 business days. We ask that you follow-up with your pharmacy.

## 2020-12-06 ENCOUNTER — Encounter: Payer: Self-pay | Admitting: Podiatry

## 2020-12-07 ENCOUNTER — Other Ambulatory Visit: Payer: Self-pay | Admitting: Podiatry

## 2020-12-07 MED ORDER — PREGABALIN 75 MG PO CAPS
75.0000 mg | ORAL_CAPSULE | Freq: Every day | ORAL | 3 refills | Status: DC
  Filled 2020-12-07: qty 90, 90d supply, fill #0
  Filled 2021-03-07: qty 90, 90d supply, fill #1
  Filled 2021-06-06: qty 90, 90d supply, fill #2
  Filled 2021-08-31 – 2021-09-02 (×3): qty 90, 90d supply, fill #3

## 2020-12-08 ENCOUNTER — Ambulatory Visit: Payer: No Typology Code available for payment source | Admitting: Endocrinology

## 2020-12-08 ENCOUNTER — Other Ambulatory Visit: Payer: Self-pay

## 2020-12-08 VITALS — BP 160/100 | HR 105 | Ht 64.0 in | Wt 218.4 lb

## 2020-12-08 DIAGNOSIS — Z794 Long term (current) use of insulin: Secondary | ICD-10-CM

## 2020-12-08 DIAGNOSIS — E1142 Type 2 diabetes mellitus with diabetic polyneuropathy: Secondary | ICD-10-CM

## 2020-12-08 LAB — POCT GLYCOSYLATED HEMOGLOBIN (HGB A1C): Hemoglobin A1C: 8.1 % — AB (ref 4.0–5.6)

## 2020-12-08 MED ORDER — FREESTYLE LIBRE 2 SENSOR MISC
1.0000 | 3 refills | Status: DC
  Filled 2020-12-08: qty 1, 14d supply, fill #0

## 2020-12-08 MED ORDER — FREESTYLE LIBRE 2 READER DEVI
1.0000 | Freq: Once | 1 refills | Status: AC
  Filled 2020-12-08: qty 1, 1d supply, fill #0

## 2020-12-08 NOTE — Patient Instructions (Addendum)
Please continue the same 4 diabetes medications.  I have sent a prescription to your pharmacy, for the continuous glucose monitor.   check your blood sugar twice a day.  vary the time of day when you check, between before the 3 meals, and at bedtime.  also check if you have symptoms of your blood sugar being too high or too low.  please keep a record of the readings and bring it to your next appointment here (or you can bring the meter itself).  You can write it on any piece of paper.  please call us sooner if your blood sugar goes below 70, or if you have a lot of readings over 200.  Please come back for a follow-up appointment in 3 months.

## 2020-12-08 NOTE — Progress Notes (Signed)
Subjective:    Patient ID: Stacey Roberts, female    DOB: 1965/04/24, 55 y.o.   MRN: [id number]  HPI Pt has stage-1 PTC of the thyroid.    2/11: thyroidectomy: T1b N0 M0.  4/11: RAI 103 mCi, with thyrogen.  12/11: neck US: single enlarged right cervical lymph node, nonspecific.   6/12: body scan (thyrogen) neg.   12/12: [place]: lymph node is stable.  6/13: [place]: overall node morphology and volume show very little change.   9/14  TG undetectable (ab neg) 3/15: TG undetectable (ab neg) 10/15 TG undetectable (ab neg).  3/17 TG undetectable (ab neg).   4/19 TG undetectable (ab neg).   6/20 [place] no tumor 2/20 TG undetectable (ab neg).   3/21 TG undetectable (ab neg).   1/22 TG undetectable (ab neg). Goal TSH is normal, due to long disease-free interval.   Pt returns for f/u of diabetes mellitus:  DM type: Insulin-requiring type 2 Dx'ed: 1275 Complications: PPN and CAD.  Therapy: insulin since early 2017, Ozempic, and 2 oral meds.   GDM: never.  DKA: never.   Severe hypoglycemia: never.   Pancreatitis: never.  Other: she declines multiple daily injections; edema precudes pioglitizone rx.  She declines bariatric surgery for now.  She declines to add bromocriptine; she did not tolerate Trulicity (nausea). Interval history: no cbg record, but states cbg varies from 90-225. She takes meds as rx'ed.  Past Medical History:  Diagnosis Date   Allergy    seasonal   BRCA negative 2017   Breast cancer ([place]) 2014   Breast cancer ([place]) 2017   Bronchitis    hx of   Coronary artery disease 06/2019   80% mid RCA stenosis - medical management   Diabetes mellitus, type II ([place])    Endometrial hyperplasia 2014   Mirena placed; Dr. 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 Buddy Duty    Past Surgical History:  Procedure Laterality Date   APPLICATION OF A-CELL OF EXTREMITY Left 09/29/2020   Procedure: placement of ACell;  Surgeon: Wallace Going, DO;  Location: [place];  Service: Plastics;  Laterality: Left;   BREAST BIOPSY  2000   s/p   BREAST BIOPSY Left 01/22/2013   Procedure: RE-EXCISION LEFT BREAST DCIS;  Surgeon: Harl Bowie, MD;  Location: [facility];  Service: General;  Laterality: Left;   BREAST LUMPECTOMY Left 01/06/2013   Procedure: LUMPECTOMY;  Surgeon: Harl Bowie, MD;  Location: [place];  Service: General;  Laterality: Left;   BREAST LUMPECTOMY Left 2017   BREAST LUMPECTOMY WITH NEEDLE LOCALIZATION AND AXILLARY SENTINEL LYMPH NODE BX Left 09/30/2015   Procedure: Left BREAST LUMPECTOMY WITH NEEDLE LOCALIZATION AND AXILLARY SENTINEL LYMPH NODE BX;  Surgeon: Coralie Keens, MD;  Location: [place];  Service: General;  Laterality: Left;   BREAST RECONSTRUCTION Bilateral 10/12/2015   Procedure: BREAST oncoplastic RECONSTRUCTION;  Surgeon: Irene Limbo, MD;  Location: [place];  Service: Plastics;  Laterality: Bilateral;   BREAST REDUCTION SURGERY Bilateral 10/12/2015   Procedure: MAMMARY REDUCTION  (BREAST);  Surgeon: Irene Limbo, MD;  Location: [place];  Service: Plastics;  Laterality: Bilateral;   CORONARY ANGIOPLASTY     DILATION AND CURETTAGE OF UTERUS  2004   s/p   EXCISION OF BREAST LESION Left 10/12/2015   Procedure: Re-EXCISION OF BREAST;  Surgeon: Coralie Keens, MD;  Location: [facility];  Service: General;   Laterality: Left;   INCISION AND DRAINAGE OF WOUND Left 09/29/2020   Procedure: Excision of back and left breast wound;  Surgeon: Wallace Going, DO;  Location: [facility];  Service: Plastics;  Laterality: Left;  1 hour   LATISSIMUS FLAP TO BREAST Left 07/19/2020   Procedure: Left breast radiation necrosis excision with latissimus muscle flap;  Surgeon: Wallace Going, DO;  Location: [place];  Service: Plastics;  Laterality: Left;  3 hours   LEFT HEART CATH AND CORONARY ANGIOGRAPHY N/A 06/27/2019   Procedure: LEFT HEART CATH AND CORONARY ANGIOGRAPHY;  Surgeon: Nelva Bush, MD;  Location: [facility];  Service: Cardiovascular;  Laterality: N/A;   REDUCTION MAMMAPLASTY Bilateral 09/2015   THYROIDECTOMY  02/2009   Dr Harlow Asa   TONSILLECTOMY  1982   TONSILLECTOMY      Social History   Socioeconomic History   Marital status: Single    Spouse name: Not on file   Number of children: Not on file   Years of education: Not on file   Highest education level: Not on file  Occupational History    Employer: [employer]    Comment: Outpatient scheduling in radiology  Tobacco Use   Smoking status: Former    Packs/day: 0.50    Types: Cigarettes    Quit date: 09/29/2007    Years since quitting: 13.2   Smokeless tobacco: Never   Tobacco comments:    Smoked on and off for 20 years. Quit about 8 years ago (as of 08/2015)  Vaping Use   Vaping Use: Never used  Substance and Sexual Activity   Alcohol use: Not Currently    Alcohol/week: 0.0 standard drinks   Drug use: No   Sexual activity: Not Currently  Other Topics Concern   Not on file  Social History Narrative   The patient is divorced, moved to [place] in 2006 from [place].  She does not have any children, currently liveswith her boyfriend and is working as a Nurse, adult for [employer].  No alcohol.  Tobacco use:  She quit 5 years ago.  She smoked onand off for approximately 20 years.  No  history of recreational drugUse. Drinks two cups of coffee per work day.    Social Determinants of Health   Financial Resource Strain: Not on file  Food Insecurity: Not on file  Transportation Needs: Not on file  Physical Activity: Not on file  Stress: Not on file  Social Connections: Not on file  Intimate Partner Violence: Not on file    Current Outpatient Medications on File Prior to Visit  Medication Sig Dispense Refill   aspirin EC 81 MG tablet Take 1 tablet (81 mg total) by mouth daily. 90 tablet 3   Blood Glucose Monitoring Suppl (FREESTYLE LITE) DEVI 1 each by Does not apply route 2 (two)  times daily. [name] 1 each 0   dapagliflozin propanediol (FARXIGA) 5 MG TABS tablet TAKE 1 TABLET BY MOUTH DAILY. 90 tablet 3   gabapentin (NEURONTIN) 300 MG capsule Take 600 mg by mouth at bedtime as needed.     glucose blood (FREESTYLE LITE) test strip Use 2 (two) times daily. (dx [name]) 200 each 0   insulin glargine-yfgn (SEMGLEE) 100 UNIT/ML Pen Inject 45 Units into the skin every morning. 45 mL 3   Insulin Pen Needle (UNIFINE PENTIPS) 31G X 8 MM MISC use to inject twice a day 100 each 3   Lancets (FREESTYLE) lancets 1 each by Other route 2 (two) times daily. [name] 200 each 0   levothyroxine (SYNTHROID) 137 MCG tablet TAKE 1 TABLET (137 MCG TOTAL) BY MOUTH DAILY BEFORE BREAKFAST. 90 tablet 3   losartan (COZAAR) 50 MG tablet TAKE 1 TABLET (50 MG TOTAL) BY MOUTH DAILY. 30 tablet 2   metFORMIN (GLUCOPHAGE-XR) 500 MG 24 hr tablet TAKE 4 TABLETS BY MOUTH DAILY WITH BREAKFAST 360 tablet 3   metoprolol tartrate (LOPRESSOR) 100 MG tablet Take 1 tablet (100 mg total) by mouth 2 (two) times daily. 180 tablet 2   Multiple Vitamins-Minerals (MULTIVITAMIN WITH MINERALS) tablet Take 1 tablet by mouth daily. Woman's Gummies     nitroGLYCERIN (NITROSTAT) 0.4 MG SL tablet Place 1 tablet (0.4 mg total) under the tongue every 5 (five) minutes as needed for chest pain. 25 tablet prn   pantoprazole (PROTONIX) 40  MG tablet TAKE 1 TABLET BY MOUTH DAILY. 90 tablet 0   pregabalin (LYRICA) 75 MG capsule Take 1 capsule (75 mg total) by mouth daily. 90 capsule 3   rosuvastatin (CRESTOR) 20 MG tablet TAKE 1 TABLET BY MOUTH DAILY. 90 tablet 3   Semaglutide, 1 MG/DOSE, 4 MG/3ML SOPN Inject 1 mg as directed once a week. 9 mL 3   tamoxifen (NOLVADEX) 20 MG tablet TAKE 1 TABLET BY MOUTH DAILY. 90 tablet 3   No current facility-administered medications on file prior to visit.    Allergies  Allergen Reactions   Penicillins Shortness Of Breath and Rash    Has patient had a PCN reaction causing immediate rash, facial/tongue/throat swelling, SOB or lightheadedness with hypotension: Yes Has patient had a PCN reaction causing severe rash involving mucus membranes or skin necrosis: Yes Has patient had a PCN reaction that required hospitalization No Has patient had a PCN reaction occurring within the last 10 years: Yes If all of the above answers are "NO", then may proceed with Cephalosporin use.    Clindamycin/Lincomycin Other (See Comments)    Severe stomach cramps   Ciprofloxacin Itching   Ace Inhibitors Cough    REACTION: cough; denies airway involvement     Family History  Problem Relation Age of Onset   CAD Mother 3       Died age 59 of CAD   Heart disease Mother        CABG x 2   Graves' disease Mother    Hypertension Mother    Thyroid disease Mother    Heart attack Mother 83   Dementia Father        deceased age 55 secondary to dementia   Bipolar disorder Father    Emphysema Father    Heart disease Father    COPD Father        smoker   Hypertension Father    Depression Father    Alcoholism Father    Other Sister 8  hysterectomy for fibroids   Heart disease Sister        CABG x 2   Heart attack Sister 47   Prostate cancer Brother        low grade; w/ surveillance   Thyroid nodules Brother 15   Heart disease Brother 70       CABG x 4   CAD Maternal Grandfather 4   Heart  disease Maternal Grandfather    Heart attack Maternal Grandfather 64   Kidney cancer Cousin        maternal 1st cousin dx 58-47; former smoker   Fibroids Other        niece dx approx 68   Hypertension Other    Lung cancer Other        maternal great aunt (MGM's sister); not a smoker   Cancer Other        nephew dx neuroblastoma at 1.53 years old   Colon cancer Neg Hx    Colon polyps Neg Hx     BP (!) 160/100 (BP Location: Right Arm, Patient Position: Sitting, Cuff Size: Large)   Pulse (!) 105   Ht '5\' 4"'  (1.626 m)   Wt 218 lb 6.4 oz (99.1 kg)   LMP 12/23/2012   SpO2 98%   BMI 37.49 kg/m    Review of Systems Nausea is mild.  She denies hypoglycemia.      Objective:   Physical Exam    Lab Results  Component Value Date   HGBA1C 8.1 (A) 12/08/2020      Assessment & Plan:  Insulin-requiring type 2 DM: uncontrolled.  I advised her to increase [place], but she declines.   Patient Instructions  Please continue the same 4 diabetes medications.  I have sent a prescription to your pharmacy, for the continuous glucose monitor.   check your blood sugar twice a day.  vary the time of day when you check, between before the 3 meals, and at bedtime.  also check if you have symptoms of your blood sugar being too high or too low.  please keep a record of the readings and bring it to your next appointment here (or you can bring the meter itself).  You can write it on any piece of paper.  please call us sooner if your blood sugar goes below 70, or if you have a lot of readings over 200.  Please come back for a follow-up appointment in 3 months.

## 2020-12-09 ENCOUNTER — Other Ambulatory Visit: Payer: Self-pay

## 2020-12-31 ENCOUNTER — Other Ambulatory Visit: Payer: Self-pay

## 2020-12-31 MED FILL — Levothyroxine Sodium Tab 137 MCG: ORAL | 90 days supply | Qty: 90 | Fill #2 | Status: AC

## 2021-01-10 ENCOUNTER — Other Ambulatory Visit: Payer: Self-pay

## 2021-01-10 ENCOUNTER — Other Ambulatory Visit: Payer: Self-pay | Admitting: Internal Medicine

## 2021-01-10 NOTE — Telephone Encounter (Signed)
Requested medication (s) are due for refill today: yes  Requested medication (s) are on the active medication list: yes  Last refill:  10/18/20  Future visit scheduled: no  Notes to clinic:  [name] NP filled this in June from [facility], pt has not had an appt at [facility], and there is no upcoming appt scheduled, please assess.   Requested Prescriptions  Pending Prescriptions Disp Refills   pantoprazole (PROTONIX) 40 MG tablet 90 tablet 0    Sig: TAKE 1 TABLET BY MOUTH DAILY.     Gastroenterology: Proton Pump Inhibitors Failed - 01/10/2021 10:09 AM      Failed - Valid encounter within last 12 months    Recent Outpatient Visits   None     Future Appointments             In 3 weeks Baity, Coralie Keens, NP [facility]

## 2021-01-11 ENCOUNTER — Other Ambulatory Visit: Payer: Self-pay

## 2021-01-11 MED ORDER — PANTOPRAZOLE SODIUM 40 MG PO TBEC
DELAYED_RELEASE_TABLET | Freq: Every day | ORAL | 0 refills | Status: DC
  Filled 2021-01-11: qty 90, 90d supply, fill #0

## 2021-01-12 ENCOUNTER — Ambulatory Visit: Payer: No Typology Code available for payment source | Admitting: Podiatry

## 2021-01-12 ENCOUNTER — Other Ambulatory Visit: Payer: Self-pay

## 2021-01-12 ENCOUNTER — Encounter: Payer: Self-pay | Admitting: Podiatry

## 2021-01-12 DIAGNOSIS — E1142 Type 2 diabetes mellitus with diabetic polyneuropathy: Secondary | ICD-10-CM

## 2021-01-12 NOTE — Progress Notes (Signed)
She presents today for follow-up of her feet.  She is primarily here for diabetic foot exam.  She states that she has had no problems or concerns.  Relates occasional numbness and tingling  Objective: Vital signs are stable alert and oriented x3 pulses are palpable.  Neurologic sensorium is intact per Semmes Weinstein monofilament.  Deep tendon reflexes are intact muscle strength is normal symmetrical bilateral no open lesions or wounds are noted.  All joints distal to ankle full range of motion without crepitation.  Assessment: Early diabetic peripheral neuropathy.  Plan: We discussed that she should continue to moisturize her feet on a regular basis wear appropriate shoe gear and she will follow-up with me in a year.

## 2021-01-31 ENCOUNTER — Other Ambulatory Visit: Payer: Self-pay

## 2021-02-04 ENCOUNTER — Ambulatory Visit (INDEPENDENT_AMBULATORY_CARE_PROVIDER_SITE_OTHER): Payer: No Typology Code available for payment source | Admitting: Internal Medicine

## 2021-02-04 ENCOUNTER — Other Ambulatory Visit: Payer: Self-pay

## 2021-02-04 ENCOUNTER — Encounter: Payer: Self-pay | Admitting: Internal Medicine

## 2021-02-04 VITALS — BP 133/67 | HR 86 | Ht 64.0 in | Wt 211.6 lb

## 2021-02-04 DIAGNOSIS — Z853 Personal history of malignant neoplasm of breast: Secondary | ICD-10-CM | POA: Diagnosis not present

## 2021-02-04 DIAGNOSIS — K219 Gastro-esophageal reflux disease without esophagitis: Secondary | ICD-10-CM

## 2021-02-04 DIAGNOSIS — I25118 Atherosclerotic heart disease of native coronary artery with other forms of angina pectoris: Secondary | ICD-10-CM

## 2021-02-04 DIAGNOSIS — E1169 Type 2 diabetes mellitus with other specified complication: Secondary | ICD-10-CM

## 2021-02-04 DIAGNOSIS — F32A Depression, unspecified: Secondary | ICD-10-CM

## 2021-02-04 DIAGNOSIS — I471 Supraventricular tachycardia: Secondary | ICD-10-CM

## 2021-02-04 DIAGNOSIS — E785 Hyperlipidemia, unspecified: Secondary | ICD-10-CM

## 2021-02-04 DIAGNOSIS — Z0001 Encounter for general adult medical examination with abnormal findings: Secondary | ICD-10-CM | POA: Diagnosis not present

## 2021-02-04 DIAGNOSIS — E1142 Type 2 diabetes mellitus with diabetic polyneuropathy: Secondary | ICD-10-CM | POA: Diagnosis not present

## 2021-02-04 DIAGNOSIS — E890 Postprocedural hypothyroidism: Secondary | ICD-10-CM

## 2021-02-04 DIAGNOSIS — F419 Anxiety disorder, unspecified: Secondary | ICD-10-CM

## 2021-02-04 DIAGNOSIS — C73 Malignant neoplasm of thyroid gland: Secondary | ICD-10-CM

## 2021-02-04 DIAGNOSIS — I10 Essential (primary) hypertension: Secondary | ICD-10-CM

## 2021-02-04 MED ORDER — PAROXETINE HCL 10 MG PO TABS
10.0000 mg | ORAL_TABLET | Freq: Every day | ORAL | 2 refills | Status: DC
  Filled 2021-02-04: qty 30, 30d supply, fill #0

## 2021-02-04 NOTE — Progress Notes (Signed)
Subjective:    Patient ID: Stacey Roberts, female    DOB: 10/07/1965, 56 y.o.   MRN: [id number]  HPI  Patient presents the clinic today for her annual exam.  She is also due to follow-up chronic conditions.  History of Breast Cancer: Status post lumpectomy, radiation, reduction and reconstruction.  She is taking Tamoxifen as prescribed.  She continues to follow with oncology.  DM2: Her [name] was 8.1%, 11/2020.  Her sugars range 80-190. She is taking [place], Metformin, Ozempic and Semglee prescribed.  She takes Pregabalin and Gabapentin for neuropathic pain.  She follows with podiatry.  Her last eye exam was 12/2020.  She follows with endocrinology.  GERD: She denies breakthrough on Pantoprazole.  There is no upper GI on file.  HLD with CAD her last LDL was 46, triglycerides 115, 01/2020.  She denies myalgias on Rosuvastatin.  She does not consume a low-fat diet.  She follows with cardiology.  HTN/PSVT: Her BP today is 133/67.  She is taking Losartan and Metoprolol as prescribed.  ECG from 09/2020 reviewed.  Hypothyroidism: Postsurgical secondary to thyroid cancer.  She denies any issues on her current dose of Levothyroxine.  She is due for repeat thyroid ultrasound.  She follows with endocrinology.    Anxiety and Depression: Managed without meds but she has noticed that she has been more irritable lately she is not currently seeing a therapist.  She denies SI/HI.    Flu: 10/2019 Tetanus: 04/2005 Pneumovax: 04/2005 COVID: [place] x2 Shingrix: Never Pap smear: 09/2017 Mammogram: 09/2019 Colon screening: 03/2015 Vision screening: Annually Dentist: Biannually  Diet: She does eat meat.  She consumes some fruits and vegetables.  She does eat some fried foods. Exercise: None  Review of Systems     Past Medical History:  Diagnosis Date   Allergy    seasonal   BRCA negative 2017   Breast cancer ([place]) 2014   Breast cancer ([place]) 2017   Bronchitis    hx of   Coronary artery disease  06/2019   80% mid RCA stenosis - medical management   Diabetes mellitus, type II ([place])    Endometrial hyperplasia 2014   Mirena placed; Dr. 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 Buddy Duty    Current Outpatient Medications  Medication Sig Dispense Refill   aspirin EC 81 MG tablet Take 1 tablet (81 mg total) by mouth daily. 90 tablet 3   Blood Glucose Monitoring Suppl (FREESTYLE LITE) DEVI 1 each by Does not apply route 2 (two) times daily. [name] 1 each 0   Continuous Blood Gluc Sensor (FREESTYLE LIBRE 2 SENSOR) MISC Change device every 14 days 6 each 3   dapagliflozin propanediol (FARXIGA) 5 MG TABS tablet TAKE 1 TABLET BY MOUTH DAILY. 90 tablet 3   gabapentin (NEURONTIN) 300 MG capsule  Take 600 mg by mouth at bedtime as needed.     glucose blood (FREESTYLE LITE) test strip Use 2 (two) times daily. (dx [name]) 200 each 0   insulin glargine-yfgn (SEMGLEE) 100 UNIT/ML Pen Inject 45 Units into the skin every morning. 45 mL 3   Insulin Pen Needle (UNIFINE PENTIPS) 31G X 8 MM MISC use to inject twice a day 100 each 3   Lancets (FREESTYLE) lancets 1 each by Other route 2 (two) times daily. [name] 200 each 0   levothyroxine (SYNTHROID) 137 MCG tablet TAKE 1 TABLET (137 MCG TOTAL) BY MOUTH DAILY BEFORE BREAKFAST. 90 tablet 3   losartan (COZAAR) 50 MG tablet TAKE 1 TABLET (50 MG TOTAL) BY MOUTH DAILY. 30 tablet 2   metFORMIN (GLUCOPHAGE-XR) 500 MG  24 hr tablet TAKE 4 TABLETS BY MOUTH DAILY WITH BREAKFAST 360 tablet 3   metoprolol tartrate (LOPRESSOR) 100 MG tablet Take 1 tablet (100 mg total) by mouth 2 (two) times daily. 180 tablet 2   Multiple Vitamins-Minerals (MULTIVITAMIN WITH MINERALS) tablet Take 1 tablet by mouth daily. Woman's Gummies     nitroGLYCERIN (NITROSTAT) 0.4 MG SL tablet Place 1 tablet (0.4 mg total) under the tongue every 5 (five) minutes as needed for chest pain. 25 tablet prn   pantoprazole (PROTONIX) 40 MG tablet TAKE 1 TABLET BY MOUTH DAILY. 90 tablet 0   pregabalin (LYRICA) 75 MG capsule Take 1 capsule (75 mg total) by mouth daily. 90 capsule 3   rosuvastatin (CRESTOR) 20 MG tablet TAKE 1 TABLET BY MOUTH DAILY. 90 tablet 3   Semaglutide, 1 MG/DOSE, 4 MG/3ML SOPN Inject 1 mg as directed once a week. 9 mL 3   tamoxifen (NOLVADEX) 20 MG tablet TAKE 1 TABLET BY MOUTH DAILY. 90 tablet 3   No current facility-administered medications for this visit.    Allergies  Allergen Reactions   Penicillins Shortness Of Breath and Rash    Has patient had a PCN reaction causing immediate rash, facial/tongue/throat swelling, SOB or lightheadedness with hypotension: Yes Has patient had a PCN reaction causing severe rash involving mucus membranes or skin necrosis: Yes Has patient had a PCN reaction that required hospitalization No Has patient had a PCN reaction occurring within the last 10 years: Yes If all of the above answers are "NO", then may proceed with Cephalosporin use.    Clindamycin/Lincomycin Other (See Comments)    Severe stomach cramps   Ciprofloxacin Itching   Ace Inhibitors Cough    REACTION: cough; denies airway involvement     Family History  Problem Relation Age of Onset   CAD Mother 20       Died age 28 of CAD   Heart disease Mother        CABG x 2   Graves' disease Mother    Hypertension Mother    Thyroid disease Mother    Heart attack Mother 1   Dementia Father        deceased age 71 secondary  to dementia   Bipolar disorder Father    Emphysema Father    Heart disease Father    COPD Father        smoker   Hypertension Father    Depression Father    Alcoholism Father    Other Sister 55       hysterectomy for fibroids   Heart disease Sister        CABG x 2   Heart attack Sister 37   Prostate cancer Brother  low grade; w/ surveillance   Thyroid nodules Brother 59   Heart disease Brother 58       CABG x 4   CAD Maternal Grandfather 7   Heart disease Maternal Grandfather    Heart attack Maternal Grandfather 21   Kidney cancer Cousin        maternal 1st cousin dx 30-47; former smoker   Fibroids Other        niece dx approx 36   Hypertension Other    Lung cancer Other        maternal great aunt (MGM's sister); not a smoker   Cancer Other        nephew dx neuroblastoma at 22.64 years old   Colon cancer Neg Hx    Colon polyps Neg Hx     Social History   Socioeconomic History   Marital status: Single    Spouse name: Not on file   Number of children: Not on file   Years of education: Not on file   Highest education level: Not on file  Occupational History    Employer: [employer]    Comment: Outpatient scheduling in radiology  Tobacco Use   Smoking status: Former    Packs/day: 0.50    Types: Cigarettes    Quit date: 09/29/2007    Years since quitting: 13.3   Smokeless tobacco: Never   Tobacco comments:    Smoked on and off for 20 years. Quit about 8 years ago (as of 08/2015)  Vaping Use   Vaping Use: Never used  Substance and Sexual Activity   Alcohol use: Not Currently    Alcohol/week: 0.0 standard drinks   Drug use: No   Sexual activity: Not Currently  Other Topics Concern   Not on file  Social History Narrative   The patient is divorced, moved to [place] in 2006 from [place].  She does not have any children, currently liveswith her boyfriend and is working as a Nurse, adult for [employer].  No alcohol.  Tobacco use:  She quit 5  years ago.  She smoked onand off for approximately 20 years.  No history of recreational drugUse. Drinks two cups of coffee per work day.    Social Determinants of Health   Financial Resource Strain: Not on file  Food Insecurity: Not on file  Transportation Needs: Not on file  Physical Activity: Not on file  Stress: Not on file  Social Connections: Not on file  Intimate Partner Violence: Not on file     Constitutional: Denies fever, malaise, fatigue, headache or abrupt weight changes.  HEENT: Denies eye pain, eye redness, ear pain, ringing in the ears, wax buildup, runny nose, nasal congestion, bloody nose, or sore throat. Respiratory: Denies difficulty breathing, shortness of breath, cough or sputum production.   Cardiovascular: Denies chest pain, chest tightness, palpitations or swelling in the hands or feet.  Gastrointestinal: Denies abdominal pain, bloating, constipation, diarrhea or blood in the stool.  GU: Denies urgency, frequency, pain with urination, burning sensation, blood in urine, odor or discharge. Musculoskeletal: Denies decrease in range of motion, difficulty with gait, muscle pain or joint pain and swelling.  Skin: Denies redness, rashes, lesions or ulcercations.  Neurological: Patient reports neuropathic pain.  Denies dizziness, difficulty with memory, difficulty with speech or problems with balance and coordination.  Psych: Patient has a history of anxiety and depression.  Denies SI/HI.  No other specific complaints in a complete review of systems (except as listed in HPI  above).  Objective:   Physical Exam   BP 133/67    Pulse 86    Ht '5\' 4"'  (1.626 m)    Wt 211 lb 9.6 oz (96 kg)    LMP 12/23/2012    SpO2 99%    BMI 36.32 kg/m  Wt Readings from Last 3 Encounters:  02/04/21 211 lb 9.6 oz (96 kg)  12/08/20 218 lb 6.4 oz (99.1 kg)  11/25/20 217 lb (98.4 kg)    General: Appears her stated age, obese, in NAD. Skin: Warm, dry and intact. No ulcerations  noted. HEENT: Head: normal shape and size; Eyes: sclera white and EOMs intact;  Neck:  Neck supple, trachea midline.  Cardiovascular: Normal rate and rhythm. S1,S2 noted.  No murmur, rubs or gallops noted. No JVD or BLE edema. No carotid bruits noted. Pulmonary/Chest: Normal effort and positive vesicular breath sounds. No respiratory distress. No wheezes, rales or ronchi noted.  Abdomen: Soft and nontender. Normal bowel sounds.  Musculoskeletal: Strength 5/5 BUE/BLE.  No difficulty with gait.  Neurological: Alert and oriented. Cranial nerves II-XII grossly intact. Coordination normal.  Psychiatric: Mood and affect normal. Behavior is normal. Judgment and thought content normal.     BMET    Component Value Date/Time   NA 139 09/16/2020 0813   NA 138 03/21/2017 1018   NA 137 02/18/2013 1554   K 4.2 09/16/2020 0813   K 4.4 02/18/2013 1554   CL 105 09/16/2020 0813   CO2 25 09/16/2020 0813   CO2 25 02/18/2013 1554   GLUCOSE 225 (H) 09/16/2020 0813   GLUCOSE 300 (H) 02/18/2013 1554   BUN 17 09/16/2020 0813   BUN 16 03/21/2017 1018   BUN 14.2 02/18/2013 1554   CREATININE 0.75 09/16/2020 0813   CREATININE 0.8 02/18/2013 1554   CALCIUM 8.8 (L) 09/16/2020 0813   CALCIUM 9.2 02/18/2013 1554   GFRNONAA >60 09/16/2020 0813   GFRAA >60 08/28/2019 0852   GFRAA >60 07/16/2017 1442    Lipid Panel     Component Value Date/Time   CHOL 121 02/03/2020 0854   CHOL 122 03/21/2017 1018   TRIG 115.0 02/03/2020 0854   HDL 51.80 02/03/2020 0854   HDL 41 03/21/2017 1018   CHOLHDL 2 02/03/2020 0854   VLDL 23.0 02/03/2020 0854   LDLCALC 46 02/03/2020 0854   LDLCALC 63 03/21/2017 1018    CBC    Component Value Date/Time   WBC 7.9 09/16/2020 0813   WBC 10.1 07/09/2020 0852   RBC 4.80 09/16/2020 0813   HGB 12.8 09/16/2020 0813   HGB 13.5 03/21/2017 1018   HGB 12.5 02/18/2013 1554   HCT 40.7 09/16/2020 0813   HCT 42.0 03/21/2017 1018   HCT 39.6 02/18/2013 1554   PLT 230 09/16/2020 0813    PLT 306 02/18/2013 1554   MCV 84.8 09/16/2020 0813   MCV 86 03/21/2017 1018   MCV 82.6 02/18/2013 1554   MCH 26.7 09/16/2020 0813   MCHC 31.4 09/16/2020 0813   RDW 14.6 09/16/2020 0813   RDW 14.6 03/21/2017 1018   RDW 16.4 (H) 02/18/2013 1554   LYMPHSABS 1.6 09/16/2020 0813   LYMPHSABS 1.5 03/21/2017 1018   LYMPHSABS 2.0 02/18/2013 1554   MONOABS 0.5 09/16/2020 0813   MONOABS 0.6 02/18/2013 1554   EOSABS 0.2 09/16/2020 0813   EOSABS 0.1 03/21/2017 1018   BASOSABS 0.0 09/16/2020 0813   BASOSABS 0.0 03/21/2017 1018   BASOSABS 0.0 02/18/2013 1554    Hgb A1C Lab Results  Component Value Date  HGBA1C 8.1 (A) 12/08/2020           Assessment & Plan:   Preventative Health Maintenance:  She declines flu shot today She declines tetanus booster She declines Pneumovax Encouraged her to get her COVID booster Discussed Shingrix vaccine, she will get this at the pharmacy if she would like to get this done Pap smear due 2024 Mammogram scheduled Colon screening UTD Encouraged her to consume a balanced diet and exercise regimen Advised her to see an eye doctor and dentist annually  we will check c-Met and lipid profile today All other labs reviewed in epic  RTC in 6 months, follow-up chronic conditions   [name], NP This visit occurred during the SARS-CoV-2 public health emergency.  Safety protocols were in place, including screening questions prior to the visit, additional usage of staff PPE, and extensive cleaning of exam room while observing appropriate contact time as indicated for disinfecting solutions.

## 2021-02-05 LAB — LIPID PANEL
Cholesterol: 117 mg/dL (ref <200)
HDL: 51 mg/dL (ref >=50)
LDL Cholesterol (Calc): 44 mg/dL (calc)
Non-HDL Cholesterol (Calc): 66 mg/dL (calc) (ref <130)
Total CHOL/HDL Ratio: 2.3 (calc) (ref <5.0)
Triglycerides: 136 mg/dL (ref <150)

## 2021-02-05 LAB — COMPLETE METABOLIC PANEL WITH GFR
AG Ratio: 1.3 (calc) (ref 1.0–2.5)
ALT: 12 U/L (ref 6–29)
AST: 14 U/L (ref 10–35)
Albumin: 3.9 g/dL (ref 3.6–5.1)
Alkaline phosphatase (APISO): 68 U/L (ref 37–153)
BUN: 17 mg/dL (ref 7–25)
CO2: 26 mmol/L (ref 20–32)
Calcium: 9.5 mg/dL (ref 8.6–10.4)
Chloride: 104 mmol/L (ref 98–110)
Creat: 0.69 mg/dL (ref 0.50–1.03)
Globulin: 2.9 g/dL (calc) (ref 1.9–3.7)
Glucose, Bld: 124 mg/dL — ABNORMAL HIGH (ref 65–99)
Potassium: 4.2 mmol/L (ref 3.5–5.3)
Sodium: 142 mmol/L (ref 135–146)
Total Bilirubin: 0.4 mg/dL (ref 0.2–1.2)
Total Protein: 6.8 g/dL (ref 6.1–8.1)
eGFR: 102 mL/min/{1.73_m2} (ref >=60)

## 2021-02-05 NOTE — Assessment & Plan Note (Signed)
Managed with Metoprolol

## 2021-02-05 NOTE — Assessment & Plan Note (Signed)
Try to avoid foods that trigger reflux Encourage weight loss as this can help reduce reflux symptoms Continue Pantoprazole

## 2021-02-05 NOTE — Assessment & Plan Note (Signed)
Deteriorated We will try Paroxetine Support offered

## 2021-02-05 NOTE — Assessment & Plan Note (Signed)
TSH and free T4 checked by endocrinology Continue Levothyroxine for now

## 2021-02-05 NOTE — Assessment & Plan Note (Signed)
Continue Tamoxifen She will continue to follow with oncology

## 2021-02-05 NOTE — Assessment & Plan Note (Signed)
No angina Encouraged her to consume a low-fat diet Continue Rosuvastatin and Metoprolol

## 2021-02-05 NOTE — Assessment & Plan Note (Signed)
Encourage diet and exercise for weight loss 

## 2021-02-05 NOTE — Assessment & Plan Note (Signed)
Controlled on Losartan and Metoprolol Reinforced DASH diet and exercise for weight loss C-Met today

## 2021-02-05 NOTE — Assessment & Plan Note (Signed)
Not due for A1c Urine microalbumin checked by endocrinology Encouraged her to consume a low-carb diet and exercise for weight loss Continue Farxiga, Metformin, Ozempic and Semglee Encourage routine eye exams Encourage routine foot exams She declines flu and Pneumovax today Encouraged her to get her COVID booster

## 2021-02-05 NOTE — Assessment & Plan Note (Signed)
Managed by endocrinology.

## 2021-02-05 NOTE — Patient Instructions (Signed)
Health Maintenance for Postmenopausal Women ?Menopause is a normal process in which your ability to get pregnant comes to an end. This process happens slowly over many months or years, usually between the ages of 48 and 55. Menopause is complete when you have missed your menstrual period for 12 months. ?It is important to talk with your health care provider about some of the most common conditions that affect women after menopause (postmenopausal women). These include heart disease, cancer, and bone loss (osteoporosis). Adopting a healthy lifestyle and getting preventive care can help to promote your health and wellness. The actions you take can also lower your chances of developing some of these common conditions. ?What are the signs and symptoms of menopause? ?During menopause, you may have the following symptoms: ?Hot flashes. These can be moderate or severe. ?Night sweats. ?Decrease in sex drive. ?Mood swings. ?Headaches. ?Tiredness (fatigue). ?Irritability. ?Memory problems. ?Problems falling asleep or staying asleep. ?Talk with your health care provider about treatment options for your symptoms. ?Do I need hormone replacement therapy? ?Hormone replacement therapy is effective in treating symptoms that are caused by menopause, such as hot flashes and night sweats. ?Hormone replacement carries certain risks, especially as you become older. If you are thinking about using estrogen or estrogen with progestin, discuss the benefits and risks with your health care provider. ?How can I reduce my risk for heart disease and stroke? ?The risk of heart disease, heart attack, and stroke increases as you age. One of the causes may be a change in the body's hormones during menopause. This can affect how your body uses dietary fats, triglycerides, and cholesterol. Heart attack and stroke are medical emergencies. There are many things that you can do to help prevent heart disease and stroke. ?Watch your blood pressure ?High  blood pressure causes heart disease and increases the risk of stroke. This is more likely to develop in people who have high blood pressure readings or are overweight. ?Have your blood pressure checked: ?Every 3-5 years if you are 18-39 years of age. ?Every year if you are 40 years old or older. ?Eat a healthy diet ? ?Eat a diet that includes plenty of vegetables, fruits, low-fat dairy products, and lean protein. ?Do not eat a lot of foods that are high in solid fats, added sugars, or sodium. ?Get regular exercise ?Get regular exercise. This is one of the most important things you can do for your health. Most adults should: ?Try to exercise for at least 150 minutes each week. The exercise should increase your heart rate and make you sweat (moderate-intensity exercise). ?Try to do strengthening exercises at least twice each week. Do these in addition to the moderate-intensity exercise. ?Spend less time sitting. Even light physical activity can be beneficial. ?Other tips ?Work with your health care provider to achieve or maintain a healthy weight. ?Do not use any products that contain nicotine or tobacco. These products include cigarettes, chewing tobacco, and vaping devices, such as e-cigarettes. If you need help quitting, ask your health care provider. ?Know your numbers. Ask your health care provider to check your cholesterol and your blood sugar (glucose). Continue to have your blood tested as directed by your health care provider. ?Do I need screening for cancer? ?Depending on your health history and family history, you may need to have cancer screenings at different stages of your life. This may include screening for: ?Breast cancer. ?Cervical cancer. ?Lung cancer. ?Colorectal cancer. ?What is my risk for osteoporosis? ?After menopause, you may be   at increased risk for osteoporosis. Osteoporosis is a condition in which bone destruction happens more quickly than new bone creation. To help prevent osteoporosis or  the bone fractures that can happen because of osteoporosis, you may take the following actions: ?If you are 19-50 years old, get at least 1,000 mg of calcium and at least 600 international units (IU) of vitamin D per day. ?If you are older than age 50 but younger than age 70, get at least 1,200 mg of calcium and at least 600 international units (IU) of vitamin D per day. ?If you are older than age 70, get at least 1,200 mg of calcium and at least 800 international units (IU) of vitamin D per day. ?Smoking and drinking excessive alcohol increase the risk of osteoporosis. Eat foods that are rich in calcium and vitamin D, and do weight-bearing exercises several times each week as directed by your health care provider. ?How does menopause affect my mental health? ?Depression may occur at any age, but it is more common as you become older. Common symptoms of depression include: ?Feeling depressed. ?Changes in sleep patterns. ?Changes in appetite or eating patterns. ?Feeling an overall lack of motivation or enjoyment of activities that you previously enjoyed. ?Frequent crying spells. ?Talk with your health care provider if you think that you are experiencing any of these symptoms. ?General instructions ?See your health care provider for regular wellness exams and vaccines. This may include: ?Scheduling regular health, dental, and eye exams. ?Getting and maintaining your vaccines. These include: ?Influenza vaccine. Get this vaccine each year before the flu season begins. ?Pneumonia vaccine. ?Shingles vaccine. ?Tetanus, diphtheria, and pertussis (Tdap) booster vaccine. ?Your health care provider may also recommend other immunizations. ?Tell your health care provider if you have ever been abused or do not feel safe at home. ?Summary ?Menopause is a normal process in which your ability to get pregnant comes to an end. ?This condition causes hot flashes, night sweats, decreased interest in sex, mood swings, headaches, or lack  of sleep. ?Treatment for this condition may include hormone replacement therapy. ?Take actions to keep yourself healthy, including exercising regularly, eating a healthy diet, watching your weight, and checking your blood pressure and blood sugar levels. ?Get screened for cancer and depression. Make sure that you are up to date with all your vaccines. ?This information is not intended to replace advice given to you by your health care provider. Make sure you discuss any questions you have with your health care provider. ?Document Revised: 05/31/2020 Document Reviewed: 05/31/2020 ?Elsevier Patient Education ? 2022 [employer] ? ?

## 2021-02-05 NOTE — Assessment & Plan Note (Signed)
C-Met and lipid profile today Encouraged her to consume a low-fat diet Continue Rosuvastatin 

## 2021-02-07 ENCOUNTER — Other Ambulatory Visit: Payer: Self-pay

## 2021-02-15 ENCOUNTER — Ambulatory Visit: Payer: No Typology Code available for payment source

## 2021-03-07 ENCOUNTER — Other Ambulatory Visit: Payer: Self-pay

## 2021-03-07 ENCOUNTER — Other Ambulatory Visit: Payer: Self-pay | Admitting: Internal Medicine

## 2021-03-07 MED FILL — Dapagliflozin Propanediol Tab 5 MG (Base Equivalent): ORAL | 90 days supply | Qty: 90 | Fill #3 | Status: AC

## 2021-03-08 ENCOUNTER — Other Ambulatory Visit: Payer: Self-pay

## 2021-03-08 MED ORDER — LOSARTAN POTASSIUM 50 MG PO TABS
ORAL_TABLET | Freq: Every day | ORAL | 4 refills | Status: DC
  Filled 2021-03-08: qty 90, 90d supply, fill #0
  Filled 2021-06-06: qty 60, 60d supply, fill #1

## 2021-03-08 NOTE — Telephone Encounter (Signed)
Requested Prescriptions  Pending Prescriptions Disp Refills   losartan (COZAAR) 50 MG tablet 30 tablet 4    Sig: TAKE 1 TABLET (50 MG TOTAL) BY MOUTH DAILY.     Cardiovascular:  Angiotensin Receptor Blockers Passed - 03/07/2021  6:03 AM      Passed - Cr in normal range and within 180 days    Creatinine  Date Value Ref Range Status  02/18/2013 0.8 0.6 - 1.1 mg/dL Final   Creat  Date Value Ref Range Status  02/04/2021 0.69 0.50 - 1.03 mg/dL Final   Creatinine,U  Date Value Ref Range Status  04/02/2015 79.7 mg/dL Final         Passed - K in normal range and within 180 days    Potassium  Date Value Ref Range Status  02/04/2021 4.2 3.5 - 5.3 mmol/L Final  02/18/2013 4.4 3.5 - 5.1 mEq/L Final         Passed - Patient is not pregnant      Passed - Last BP in normal range    BP Readings from Last 1 Encounters:  02/04/21 133/67         Passed - Valid encounter within last 6 months    Recent Outpatient Visits          1 month ago Encounter for general adult medical examination with abnormal findings   [facility], Coralie Keens, NP

## 2021-03-10 ENCOUNTER — Other Ambulatory Visit: Payer: Self-pay

## 2021-03-10 ENCOUNTER — Ambulatory Visit: Payer: No Typology Code available for payment source | Admitting: Endocrinology

## 2021-03-10 VITALS — BP 132/84 | HR 100 | Ht 64.0 in | Wt 212.8 lb

## 2021-03-10 DIAGNOSIS — E1142 Type 2 diabetes mellitus with diabetic polyneuropathy: Secondary | ICD-10-CM

## 2021-03-10 LAB — POCT GLYCOSYLATED HEMOGLOBIN (HGB A1C): Hemoglobin A1C: 7.6 % — AB (ref 4.0–5.6)

## 2021-03-10 MED ORDER — OZEMPIC (2 MG/DOSE) 8 MG/3ML ~~LOC~~ SOPN
2.0000 mg | PEN_INJECTOR | SUBCUTANEOUS | 3 refills | Status: DC
  Filled 2021-03-10: qty 9, 84d supply, fill #0
  Filled 2021-06-06: qty 9, 84d supply, fill #1
  Filled 2021-08-26: qty 9, 84d supply, fill #2
  Filled 2021-11-23: qty 3, 28d supply, fill #3
  Filled 2021-12-19: qty 3, 28d supply, fill #4
  Filled 2022-01-19: qty 3, 28d supply, fill #5

## 2021-03-10 NOTE — Patient Instructions (Addendum)
I have sent a prescription to your pharmacy, to increase the Ozempic.   Please continue the same other diabetes medications.  Please let me know if you want to re-try the continuous glucose monitor.   check your blood sugar twice a day.  vary the time of day when you check, between before the 3 meals, and at bedtime.  also check if you have symptoms of your blood sugar being too high or too low.  please keep a record of the readings and bring it to your next appointment here (or you can bring the meter itself).  You can write it on any piece of paper.  please call us sooner if your blood sugar goes below 70, or if you have a lot of readings over 200.  Please come back for a follow-up appointment in 3 months.

## 2021-03-10 NOTE — Progress Notes (Signed)
Subjective:    Patient ID: Stacey Roberts, female    DOB: 1965-10-07, 56 y.o.   MRN: [id number]  HPI Pt has stage-1 PTC of the thyroid.    2/11: thyroidectomy: T1b N0 M0.  4/11: RAI 103 mCi, with thyrogen.  12/11: neck US: single enlarged right cervical lymph node, nonspecific.   6/12: body scan (thyrogen) neg.   12/12: [place]: lymph node is stable.  6/13: [place]: overall node morphology and volume show very little change.   9/14  TG undetectable (ab neg) 3/15: TG undetectable (ab neg) 10/15 TG undetectable (ab neg).  3/17 TG undetectable (ab neg).   4/19 TG undetectable (ab neg).   6/20 [place] no tumor 2/20 TG undetectable (ab neg).   3/21 TG undetectable (ab neg).   1/22 TG undetectable (ab neg). Goal TSH is normal, due to long disease-free interval.   Pt returns for f/u of diabetes mellitus:  DM type: Insulin-requiring type 2 Dx'ed: 1657 Complications: PPN and CAD.  Therapy: insulin since early 2017, Ozempic, and 2 oral meds.   GDM: never.  DKA: never.   Severe hypoglycemia: never.   Pancreatitis: never.  Other: she declines multiple daily injections; edema precudes pioglitizone rx.  She declines bariatric surgery for now.  She declines to add bromocriptine; she did not tolerate Trulicity (nausea). Interval history: no cbg record, but states cbg varies from 88-232. It is in general highest at HS and fasting.  She takes meds as rx'ed.  continuous glucose monitor sensor fell off after 1 week.   Past Medical History:  Diagnosis Date   Allergy    seasonal   BRCA negative 2017   Breast cancer ([place]) 2014   Breast cancer ([place]) 2017   Bronchitis    hx of   Coronary artery disease 06/2019   80% mid RCA stenosis - medical management   Diabetes mellitus, type II ([place])    Endometrial hyperplasia 2014   Mirena placed; Dr. 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 Buddy Duty    Past Surgical History:  Procedure Laterality Date   APPLICATION OF A-CELL OF EXTREMITY Left 09/29/2020   Procedure: placement of ACell;  Surgeon: Wallace Going, DO;  Location: [facility];  Service: Plastics;  Laterality: Left;   BREAST BIOPSY  2000   s/p   BREAST BIOPSY Left 01/22/2013   Procedure: RE-EXCISION LEFT BREAST DCIS;  Surgeon: Harl Bowie, MD;  Location: [facility];  Service: General;  Laterality: Left;   BREAST LUMPECTOMY Left 01/06/2013   Procedure: LUMPECTOMY;  Surgeon: Harl Bowie, MD;  Location: [facility];  Service: General;  Laterality: Left;   BREAST LUMPECTOMY Left 2017   BREAST LUMPECTOMY WITH NEEDLE LOCALIZATION AND AXILLARY SENTINEL LYMPH NODE BX Left 09/30/2015   Procedure: Left BREAST LUMPECTOMY  WITH NEEDLE LOCALIZATION AND AXILLARY SENTINEL LYMPH NODE BX;  Surgeon: Coralie Keens, MD;  Location: [facility];  Service: General;  Laterality: Left;   BREAST RECONSTRUCTION Bilateral 10/12/2015   Procedure: BREAST oncoplastic RECONSTRUCTION;  Surgeon: Irene Limbo, MD;  Location: [facility];  Service: Plastics;  Laterality: Bilateral;   BREAST REDUCTION SURGERY Bilateral 10/12/2015   Procedure: MAMMARY REDUCTION  (BREAST);  Surgeon: Irene Limbo, MD;  Location: [facility];  Service: Plastics;  Laterality: Bilateral;   CORONARY ANGIOPLASTY     DILATION AND CURETTAGE OF UTERUS  2004   s/p   EXCISION OF BREAST LESION Left 10/12/2015   Procedure:  Re-EXCISION OF BREAST;  Surgeon: Coralie Keens, MD;  Location: [facility];  Service: General;  Laterality: Left;   INCISION AND DRAINAGE OF WOUND Left 09/29/2020   Procedure: Excision of back and left breast wound;  Surgeon: Wallace Going, DO;  Location: [facility];  Service: Plastics;  Laterality: Left;  1 hour   LATISSIMUS FLAP TO BREAST Left 07/19/2020   Procedure: Left breast radiation necrosis excision with latissimus muscle flap;  Surgeon: Wallace Going, DO;  Location: [facility];  Service: Plastics;  Laterality: Left;  3 hours   LEFT HEART CATH AND CORONARY ANGIOGRAPHY N/A 06/27/2019   Procedure: LEFT HEART CATH AND CORONARY ANGIOGRAPHY;  Surgeon: Nelva Bush, MD;  Location: [facility];  Service: Cardiovascular;  Laterality: N/A;   REDUCTION MAMMAPLASTY Bilateral 09/2015   THYROIDECTOMY  02/2009   Dr Harlow Asa   TONSILLECTOMY  1982   TONSILLECTOMY      Social History   Socioeconomic History   Marital status: Single    Spouse name: Not on file   Number of children: Not on file   Years of education: Not on file   Highest education level: Not on file  Occupational History    Employer: [employer]    Comment: Outpatient scheduling in radiology  Tobacco Use   Smoking status: Former    Packs/day: 0.50    Types: Cigarettes    Quit date: 09/29/2007    Years since quitting: 13.4   Smokeless tobacco: Never   Tobacco comments:    Smoked on and off for 20 years. Quit about 8 years ago (as of 08/2015)  Vaping Use   Vaping Use: Never used  Substance and Sexual Activity   Alcohol use: Not Currently    Alcohol/week: 0.0 standard drinks   Drug use: No   Sexual activity: Not Currently  Other Topics Concern   Not on file  Social History Narrative   The patient is divorced, moved to [place] in 2006 from [place].  She does not have any children, currently liveswith her boyfriend and is working as a Nurse, adult for [employer].  No alcohol.  Tobacco use:  She quit 5 years ago.  She smoked onand off for approximately 20 years.  No history of recreational drugUse. Drinks two cups of coffee per work day.    Social Determinants of Health   Financial Resource Strain: Not on file  Food Insecurity: Not on file  Transportation Needs: Not on file  Physical Activity: Not on file  Stress: Not on file  Social Connections: Not on file  Intimate Partner Violence: Not on file    Current Outpatient Medications on File Prior to Visit  Medication Sig Dispense Refill   aspirin EC 81 MG tablet Take 1 tablet (81 mg total) by mouth  daily. 90 tablet 3   Blood Glucose Monitoring Suppl (FREESTYLE LITE) DEVI 1 each by Does not apply route 2 (two) times daily. [name] 1 each 0   Continuous Blood Gluc Sensor (FREESTYLE LIBRE 2 SENSOR) MISC Change device every 14 days 6 each 3   dapagliflozin propanediol (FARXIGA) 5 MG TABS tablet TAKE 1 TABLET BY MOUTH DAILY. 90 tablet 3   gabapentin (NEURONTIN) 300 MG capsule Take 600 mg by mouth at bedtime as needed.     glucose blood (FREESTYLE LITE) test strip Use 2 (two) times daily. (dx [name]) 200 each 0   insulin glargine-yfgn (SEMGLEE) 100 UNIT/ML Pen Inject 45 Units into the skin every morning. 45 mL 3   Insulin Pen Needle (UNIFINE PENTIPS) 31G X 8 MM MISC use to inject twice a day 100 each 3   Lancets (FREESTYLE) lancets 1 each by Other route 2 (two) times daily. [name] 200 each 0   levothyroxine (SYNTHROID) 137 MCG tablet TAKE 1 TABLET (137 MCG TOTAL) BY MOUTH DAILY BEFORE BREAKFAST. 90 tablet 3   losartan (COZAAR) 50 MG tablet TAKE 1 TABLET (50 MG TOTAL) BY MOUTH DAILY. 30 tablet 4   metFORMIN (GLUCOPHAGE-XR) 500 MG 24 hr tablet TAKE 4 TABLETS BY MOUTH DAILY WITH BREAKFAST 360 tablet 3   metoprolol tartrate (LOPRESSOR) 100 MG tablet Take 1 tablet (100 mg total) by mouth 2 (two) times daily. 180 tablet 2   Multiple Vitamins-Minerals (MULTIVITAMIN WITH MINERALS) tablet Take 1 tablet by mouth  daily. Woman's Gummies     nitroGLYCERIN (NITROSTAT) 0.4 MG SL tablet Place 1 tablet (0.4 mg total) under the tongue every 5 (five) minutes as needed for chest pain. 25 tablet prn   pantoprazole (PROTONIX) 40 MG tablet TAKE 1 TABLET BY MOUTH DAILY. 90 tablet 0   PARoxetine (PAXIL) 10 MG tablet Take 1 tablet (10 mg total) by mouth daily. 30 tablet 2   pregabalin (LYRICA) 75 MG capsule Take 1 capsule (75 mg total) by mouth daily. 90 capsule 3   rosuvastatin (CRESTOR) 20 MG tablet TAKE 1 TABLET BY MOUTH DAILY. 90 tablet 3   No current facility-administered medications on file prior to visit.    Allergies  Allergen Reactions   Penicillins Shortness Of Breath and Rash    Has patient had a PCN reaction causing immediate rash, facial/tongue/throat swelling, SOB or lightheadedness with hypotension: Yes Has patient had a PCN reaction causing severe rash involving mucus membranes or skin necrosis: Yes Has patient had a PCN reaction that required hospitalization No Has patient had a PCN reaction occurring within the last 10 years: Yes If all of the above answers are "NO", then may proceed with Cephalosporin use.    Clindamycin/Lincomycin Other (See Comments)    Severe stomach cramps   Ciprofloxacin Itching   Ace Inhibitors Cough    REACTION: cough; denies airway involvement     Family History  Problem Relation Age of Onset   CAD Mother 13       Died age 21 of CAD   Heart disease Mother        CABG x 2   Graves' disease Mother    Hypertension Mother    Thyroid disease Mother    Heart attack Mother 41   Dementia Father        deceased age 35 secondary to dementia   Bipolar disorder Father    Emphysema Father    Heart disease Father    COPD Father  smoker   Hypertension Father    Depression Father    Alcoholism Father    Other Sister 23       hysterectomy for fibroids   Heart disease Sister        CABG x 2   Heart attack Sister 66   Prostate cancer Brother        low  grade; w/ surveillance   Thyroid nodules Brother 64   Heart disease Brother 48       CABG x 4   CAD Maternal Grandfather 60   Heart disease Maternal Grandfather    Heart attack Maternal Grandfather 64   Kidney cancer Cousin        maternal 1st cousin dx 48-47; former smoker   Fibroids Other        niece dx approx 47   Hypertension Other    Lung cancer Other        maternal great aunt (MGM's sister); not a smoker   Cancer Other        nephew dx neuroblastoma at 32.79 years old   Colon cancer Neg Hx    Colon polyps Neg Hx     BP 132/84    Pulse 100    Ht _0  (1.626 m)    Wt 212 lb 12.8 oz (96.5 kg)    LMP 12/23/2012    SpO2 96%    BMI 36.53 kg/m    Review of Systems Nausea is mild.      Objective:   Physical Exam    Lab Results  Component Value Date   HGBA1C 7.6 (A) 03/10/2021      Assessment & Plan:  Insulin-requiring type 2 DM: uncontrolled Device malfunction  Patient Instructions  I have sent a prescription to your pharmacy, to increase the Ozempic.   Please continue the same other diabetes medications.  Please let me know if you want to re-try the continuous glucose monitor.   check your blood sugar twice a day.  vary the time of day when you check, between before the 3 meals, and at bedtime.  also check if you have symptoms of your blood sugar being too high or too low.  please keep a record of the readings and bring it to your next appointment here (or you can bring the meter itself).  You can write it on any piece of paper.  please call us sooner if your blood sugar goes below 70, or if you have a lot of readings over 200.  Please come back for a follow-up appointment in 3 months.

## 2021-04-01 ENCOUNTER — Ambulatory Visit: Payer: No Typology Code available for payment source

## 2021-04-01 NOTE — Progress Notes (Signed)
Error

## 2021-04-04 ENCOUNTER — Other Ambulatory Visit: Payer: Self-pay

## 2021-04-04 ENCOUNTER — Other Ambulatory Visit: Payer: Self-pay | Admitting: Endocrinology

## 2021-04-04 MED ORDER — LEVOTHYROXINE SODIUM 137 MCG PO TABS
ORAL_TABLET | ORAL | 3 refills | Status: DC
  Filled 2021-04-04: qty 90, 90d supply, fill #0

## 2021-04-13 ENCOUNTER — Other Ambulatory Visit: Payer: Self-pay

## 2021-04-15 ENCOUNTER — Other Ambulatory Visit: Payer: Self-pay

## 2021-04-15 ENCOUNTER — Other Ambulatory Visit: Payer: Self-pay | Admitting: Internal Medicine

## 2021-04-18 ENCOUNTER — Other Ambulatory Visit: Payer: Self-pay

## 2021-04-18 ENCOUNTER — Other Ambulatory Visit: Payer: Self-pay | Admitting: Internal Medicine

## 2021-04-19 ENCOUNTER — Other Ambulatory Visit: Payer: Self-pay

## 2021-04-20 ENCOUNTER — Other Ambulatory Visit: Payer: Self-pay | Admitting: Endocrinology

## 2021-04-20 ENCOUNTER — Other Ambulatory Visit: Payer: Self-pay

## 2021-04-20 DIAGNOSIS — Z794 Long term (current) use of insulin: Secondary | ICD-10-CM

## 2021-04-20 MED FILL — Pantoprazole Sodium EC Tab 40 MG (Base Equiv): ORAL | 90 days supply | Qty: 90 | Fill #0 | Status: AC

## 2021-04-20 NOTE — Telephone Encounter (Signed)
Requested Prescriptions  ?Pending Prescriptions Disp Refills  ?? pantoprazole (PROTONIX) 40 MG tablet 90 tablet 2  ?  Sig: TAKE 1 TABLET BY MOUTH DAILY.  ?  ? Gastroenterology: Proton Pump Inhibitors Passed - 04/18/2021 12:10 PM  ?  ?  Passed - Valid encounter within last 12 months  ?  Recent Outpatient Visits   ?      ? 2 months ago Encounter for general adult medical examination with abnormal findings  ? [facility], Coralie Keens, NP  ?  ?  ? ?  ?  ?  ? ? ?

## 2021-04-21 ENCOUNTER — Other Ambulatory Visit: Payer: Self-pay

## 2021-04-21 ENCOUNTER — Other Ambulatory Visit: Payer: Self-pay | Admitting: Endocrinology

## 2021-04-21 ENCOUNTER — Ambulatory Visit
Admission: EM | Admit: 2021-04-21 | Discharge: 2021-04-21 | Disposition: A | Discharge status: Home / Self Care | Payer: No Typology Code available for payment source | Attending: Emergency Medicine | Admitting: Emergency Medicine

## 2021-04-21 DIAGNOSIS — E1142 Type 2 diabetes mellitus with diabetic polyneuropathy: Secondary | ICD-10-CM

## 2021-04-21 DIAGNOSIS — Z9089 Acquired absence of other organs: Secondary | ICD-10-CM | POA: Diagnosis not present

## 2021-04-21 DIAGNOSIS — J029 Acute pharyngitis, unspecified: Secondary | ICD-10-CM | POA: Insufficient documentation

## 2021-04-21 LAB — POCT RAPID STREP A (OFFICE): Rapid Strep A Screen: NEGATIVE

## 2021-04-21 MED ORDER — AZITHROMYCIN 250 MG PO TABS
ORAL_TABLET | ORAL | 0 refills | Status: AC
  Filled 2021-04-21: qty 6, 5d supply, fill #0

## 2021-04-21 MED FILL — Glucose Blood Test Strip: 90 days supply | Qty: 200 | Fill #0 | Status: AC

## 2021-04-21 NOTE — ED Provider Notes (Addendum)
?[facility] ? ? ? ?CSN: [id number] ?Arrival date & time: 04/21/21  9233 ?  ? ?HISTORY  ? ?Chief Complaint  ?Patient presents with  ? Sore Throat  ? ?HPI ?Stacey Roberts is a 56 y.o. female. Pt states this morning she began having a sore throat, states she has a sensation of razor blades in her throat which is familiar to previous, multiple episodes of streptococcal pharyngitis.  Patient states she had her tonsils removed at age 53 but continued to have intermittent group A and group B strep infections.  EMR reviewed, most recent group A strep culture was May 2016 and most recent group B strep culture was November 2016.  Patient requesting a rapid strep test today.  Rapid strep test today was negative.  Patient has normal vital signs on arrival.  Patient is otherwise well-appearing.  Patient denies nasal congestion, cough, upset stomach, known sick contacts.  Patient endorses mild headache.  Patient is a poorly controlled type II diabetic.  Patient reports a history of shortness of breath and rash when taking penicillin, last exposure to penicillin greater than 10 years ago. ? ?The history is provided by the patient.  ?Past Medical History:  ?Diagnosis Date  ? Allergy   ? seasonal  ? BRCA negative 2017  ? Breast cancer ([place]) 2014  ? Breast cancer ([facility]) 2017  ? Bronchitis   ? hx of  ? Coronary artery disease 06/2019  ? 80% mid RCA stenosis - medical management  ? Diabetes mellitus, type II ([place])   ? Endometrial hyperplasia 2014  ? Mirena placed; Dr. Carren Rang  ? GERD (gastroesophageal reflux disease)   ? Headache   ? History of radiation therapy 12/01/15-01/11/16  ? Left breast DIBH / 50.4 Gy in 28 fractions and Left breast boost / 12 Gy in 6 fractions  ? Hyperlipidemia   ? Hypertension   ? Hypothyroidism   ? Joint pain   ? Obesity   ? PCOS (polycystic ovarian syndrome)   ? Personal history of radiation therapy 2017  ? Pneumonia   ? as a child  ? Sleep apnea 2008  ? severe OSA-does not use a cpap  ? Snoring    ? Thyroid cancer ([place])   ? 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 Buddy Duty  ? ?Patient Active Problem List  ? Diagnosis Date Noted  ? History of tonsillectomy 04/21/2021  ? PSVT (paroxysmal supraventricular tachycardia) ([facility]) 11/24/2020  ? Coronary artery disease of native artery of native heart with stable angina pectoris ([place]) 10/29/2019  ? Morbid obesity ([facility]) 08/28/2018  ? GERD (gastroesophageal reflux disease) 07/20/2015  ? Diabetes ([place]) 04/03/2015  ? Anxiety and depression 09/09/2013  ? History of left breast cancer 02/05/2013  ? 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 ADENOCARCINOMA, THYROID GLAND, 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  ? Hyperlipidemia associated with type 2 diabetes mellitus ([place]) 12/18/2006  ? Essential hypertension 12/13/2006  ? ?Past Surgical History:  ?Procedure Laterality Date  ? APPLICATION OF A-CELL OF EXTREMITY Left 09/29/2020  ? Procedure: placement of ACell;  Surgeon: Wallace Going, DO;  Location: [facility];  Service: Plastics;  Laterality: Left;  ? BREAST BIOPSY  2000  ? s/p  ? BREAST BIOPSY Left 01/22/2013  ? Procedure: RE-EXCISION LEFT BREAST DCIS;  Surgeon: Harl Bowie, MD;  Location: [facility];  Service: General;  Laterality: Left;  ? BREAST LUMPECTOMY Left 01/06/2013  ? Procedure: LUMPECTOMY;  Surgeon: Marco Collie  Ninfa Linden, MD;  Location: [facility];  Service: General;  Laterality: Left;  ? BREAST LUMPECTOMY Left 2017  ? BREAST LUMPECTOMY WITH NEEDLE LOCALIZATION AND AXILLARY SENTINEL LYMPH NODE BX Left 09/30/2015  ? Procedure: Left BREAST LUMPECTOMY WITH NEEDLE LOCALIZATION AND AXILLARY SENTINEL LYMPH NODE BX;  Surgeon: Coralie Keens, MD;  Location: [facility];  Service: General;  Laterality: Left;  ? BREAST RECONSTRUCTION Bilateral 10/12/2015  ? Procedure: BREAST oncoplastic RECONSTRUCTION;  Surgeon: Irene Limbo, MD;  Location: [facility];  Service:  Plastics;  Laterality: Bilateral;  ? BREAST REDUCTION SURGERY Bilateral 10/12/2015  ? Procedure: MAMMARY REDUCTION  (BREAST);  Surgeon: Irene Limbo, MD;  Location: [facility];  Service: Plastics;  Laterality: Bilateral;  ? CORONARY ANGIOPLASTY    ? DILATION AND CURETTAGE OF UTERUS  2004  ? s/p  ? EXCISION OF BREAST LESION Left 10/12/2015  ? Procedure: Re-EXCISION OF BREAST;  Surgeon: Coralie Keens, MD;  Location: [facility];  Service: General;  Laterality: Left;  ? INCISION AND DRAINAGE OF WOUND Left 09/29/2020  ? Procedure: Excision of back and left breast wound;  Surgeon: Wallace Going, DO;  Location: [facility];  Service: Plastics;  Laterality: Left;  1 hour  ? LATISSIMUS FLAP TO BREAST Left 07/19/2020  ? Procedure: Left breast radiation necrosis excision with latissimus muscle flap;  Surgeon: Wallace Going, DO;  Location: [facility];  Service: Plastics;  Laterality: Left;  3 hours  ? LEFT HEART CATH AND CORONARY ANGIOGRAPHY N/A 06/27/2019  ? Procedure: LEFT HEART CATH AND CORONARY ANGIOGRAPHY;  Surgeon: Nelva Bush, MD;  Location: [facility];  Service: Cardiovascular;  Laterality: N/A;  ? REDUCTION MAMMAPLASTY Bilateral 09/2015  ? THYROIDECTOMY  02/2009  ? Dr Harlow Asa  ? TONSILLECTOMY  1982  ? TONSILLECTOMY    ? ?OB History   ? ? Gravida  ?0  ? Para  ?0  ? Term  ?0  ? Preterm  ?0  ? AB  ?0  ? Living  ?0  ?  ? ? SAB  ?0  ? IAB  ?0  ? Ectopic  ?0  ? Multiple  ?0  ? Live Births  ?0  ?   ?  ?  ? ?Home Medications   ? ?Prior to Admission medications   ?Medication Sig Start Date End Date Taking? Authorizing Provider  ?aspirin EC 81 MG tablet Take 1 tablet (81 mg total) by mouth daily. 05/23/19   End, Harrell Gave, MD  ?Blood Glucose Monitoring Suppl (FREESTYLE LITE) DEVI 1 each by Does not apply route 2 (two) times daily. [name] 02/03/19   Renato Shin, MD  ?Continuous Blood Gluc Sensor (FREESTYLE LIBRE 2 SENSOR) MISC Change device every 14 days  12/08/20   Renato Shin, MD  ?dapagliflozin propanediol (FARXIGA) 5 MG TABS tablet TAKE 1 TABLET BY MOUTH DAILY. 04/14/20 06/07/21  Renato Shin, MD  ?gabapentin (NEURONTIN) 300 MG capsule Take 600 mg by mouth at bedtime as needed.    [provider]  ?glucose blood (FREESTYLE LITE) test strip Use 2 (two) times daily. (dx [name]) 10/07/20   Renato Shin, MD  ?insulin glargine-yfgn (SEMGLEE) 100 UNIT/ML Pen Inject 45 Units into the skin every morning. 09/07/20   Renato Shin, MD  ?Insulin Pen Needle (UNIFINE PENTIPS) 31G X 8 MM MISC use to inject twice a day 06/10/20   Renato Shin, MD  ?Lancets (FREESTYLE) lancets 1 each by Other route 2 (two) times daily. [name] 02/03/19   Renato Shin,  MD  ?levothyroxine (SYNTHROID) 137 MCG tablet TAKE 1 TABLET (137 MCG TOTAL) BY MOUTH DAILY BEFORE BREAKFAST. 04/04/21 04/04/22  Renato Shin, MD  ?losartan (COZAAR) 50 MG tablet TAKE 1 TABLET (50 MG TOTAL) BY MOUTH DAILY. 03/08/21 03/08/22  Jearld Fenton, NP  ?metFORMIN (GLUCOPHAGE-XR) 500 MG 24 hr tablet TAKE 4 TABLETS BY MOUTH DAILY WITH BREAKFAST 09/03/20 09/03/21  Renato Shin, MD  ?metoprolol tartrate (LOPRESSOR) 100 MG tablet Take 1 tablet (100 mg total) by mouth 2 (two) times daily. 11/24/20 08/21/21  End, Harrell Gave, MD  ?Multiple Vitamins-Minerals (MULTIVITAMIN WITH MINERALS) tablet Take 1 tablet by mouth daily. Woman's Gummies    [provider]  ?nitroGLYCERIN (NITROSTAT) 0.4 MG SL tablet Place 1 tablet (0.4 mg total) under the tongue every 5 (five) minutes as needed for chest pain. 06/27/19   End, Harrell Gave, MD  ?pantoprazole (PROTONIX) 40 MG tablet TAKE 1 TABLET BY MOUTH DAILY. 04/20/21 04/20/22  Jearld Fenton, NP  ?PARoxetine (PAXIL) 10 MG tablet Take 1 tablet (10 mg total) by mouth daily. 02/04/21   Jearld Fenton, NP  ?pregabalin (LYRICA) 75 MG capsule Take 1 capsule (75 mg total) by mouth daily. 12/07/20   Hyatt, Max T, DPM  ?rosuvastatin (CRESTOR) 20 MG tablet TAKE 1 TABLET BY MOUTH DAILY. 07/01/20  07/14/21  Kennyth Arnold, FNP  ?Semaglutide, 2 MG/DOSE, (OZEMPIC, 2 MG/DOSE,) 8 MG/3ML SOPN Inject 2 mg into the skin once a week. 03/10/21   Renato Shin, MD  ? ?Family History ?Family History  ?Problem Relation A

## 2021-04-21 NOTE — Discharge Instructions (Addendum)
Your strep test today is negative.  Throat culture will be performed per our protocol.  The result of your throat culture will be posted to your MyChart once it is complete, this typically takes 3 to 5 days.  If there is a positive result, you will be contacted by phone and antibiotics will be prescribed for you.  Please discard your toothbrush and any other oral devices that you are currently using and replace them with new ones if your strep culture is positive within 24 hours of beginning antibiotics. ? ?Your last recorded positive group A strep test in the [facility] system was in 2016, an unquantified amount of group A strep was found in the throat swab sample obtained.  That same year, you also tested positive for a small amount of group B strep which is usually considered normal flora but was treated with azithromycin anyway.  I believe it is reasonable to provide you with empiric treatment with azithromycin today for possible bacterial infection in your throat given your razor blade pain in your throat.  Azithromycin is significantly inferior to penicillin and its analogs for treatment of streptococcal infections but since you have had good results in the past, especially since you are allergic to penicillin, even though it has been greater than 10 years since her last exposure to penicillin and you did not require hospitalization after that exposure.  Please keep in mind, the likelihood of developing a significant and likely contagious streptococcal infection in the absence of tonsils is minimal. ? ?You were tested for both COVID-19 today,the result of your viral testing will be posted to your MyChart once it is complete, this typically takes 24 to 48 hours.  If there is a positive result, you will be contacted by phone with further recommendations, if any.  I recommend that are treated with Paxlovid if you are COVID-19 is positive.  Because of your type 2 diabetes that is not completely controlled, you  are at a significantly increased risk of hospitalization with COVID-19 infection and Paxlovid can help reduce that risk. ? ?Please see the list below for recommended medications, dosages and frequencies to provide relief of your current symptoms:   ?  ?Azithromycin (Z-Pak):  take 2 tablets twice daily for 5 days. ?  ?Ibuprofen  (Advil, Motrin): This is a good anti-inflammatory medication which addresses aches, pains and inflammation of the upper airways that causes sinus and nasal congestion as well as in the lower airways which makes your cough feel tight and sometimes burn.  I recommend that you take between 400 to 600 mg every 6-8 hours as needed.    ? ?Chloraseptic Throat Spray: Spray 5 sprays into affected area every 2 hours, hold for 15 seconds and either swallow or spit it out.  This is a excellent numbing medication because it is a spray, you can put it right where you needed and so sucking on a lozenge and numbing your entire mouth.    ?  ?Conservative care is also recommended at this time.  This includes rest, pushing clear fluids and activity as tolerated.  Warm beverages such as teas and broths versus cold beverages/popsicles and frozen sherbet/sorbet are your choice, both warm and cold are beneficial.  You may also notice that your appetite is reduced; this is okay as long as you are drinking plenty of clear fluids.  ?  ?Please follow-up within the next 7-10 days either with your primary care provider or urgent care if your symptoms do  not resolve.  If you do not have a primary care provider, we will assist you in finding one. ?  ?Thank you for visiting urgent care today.  We appreciate the opportunity to participate in your care. ? ?

## 2021-04-21 NOTE — ED Triage Notes (Signed)
Pt states this morning she began having a sore throat. ?

## 2021-04-22 ENCOUNTER — Other Ambulatory Visit: Payer: Self-pay

## 2021-04-22 LAB — NOVEL CORONAVIRUS, NAA: SARS-CoV-2, NAA: NOT DETECTED

## 2021-04-23 LAB — CULTURE, GROUP A STREP (THRC)

## 2021-04-26 ENCOUNTER — Ambulatory Visit
Admission: RE | Admit: 2021-04-26 | Discharge: 2021-04-26 | Disposition: A | Discharge status: Home / Self Care | Payer: No Typology Code available for payment source | Source: Ambulatory Visit | Attending: Oncology | Admitting: Oncology

## 2021-04-26 ENCOUNTER — Other Ambulatory Visit: Payer: Self-pay | Admitting: Physician Assistant

## 2021-04-26 DIAGNOSIS — C50412 Malignant neoplasm of upper-outer quadrant of left female breast: Secondary | ICD-10-CM

## 2021-04-26 DIAGNOSIS — C50112 Malignant neoplasm of central portion of left female breast: Secondary | ICD-10-CM

## 2021-05-10 ENCOUNTER — Other Ambulatory Visit: Payer: Self-pay

## 2021-05-19 ENCOUNTER — Other Ambulatory Visit: Payer: Self-pay | Admitting: *Deleted

## 2021-05-19 ENCOUNTER — Inpatient Hospital Stay (HOSPITAL_BASED_OUTPATIENT_CLINIC_OR_DEPARTMENT_OTHER): Payer: No Typology Code available for payment source | Admitting: Hematology and Oncology

## 2021-05-19 ENCOUNTER — Encounter: Payer: Self-pay | Admitting: Hematology and Oncology

## 2021-05-19 ENCOUNTER — Other Ambulatory Visit: Payer: Self-pay

## 2021-05-19 ENCOUNTER — Inpatient Hospital Stay: Payer: No Typology Code available for payment source | Attending: Hematology and Oncology

## 2021-05-19 VITALS — BP 137/92 | HR 90 | Temp 98.1°F | Resp 18 | Ht 64.0 in | Wt 207.2 lb

## 2021-05-19 DIAGNOSIS — Z170 Estrogen receptor positive status [ER+]: Secondary | ICD-10-CM

## 2021-05-19 DIAGNOSIS — Z853 Personal history of malignant neoplasm of breast: Secondary | ICD-10-CM

## 2021-05-19 DIAGNOSIS — Z9223 Personal history of estrogen therapy: Secondary | ICD-10-CM | POA: Diagnosis not present

## 2021-05-19 DIAGNOSIS — C50112 Malignant neoplasm of central portion of left female breast: Secondary | ICD-10-CM | POA: Diagnosis not present

## 2021-05-19 LAB — CBC WITH DIFFERENTIAL (CANCER CENTER ONLY)
Abs Immature Granulocytes: 0.02 10*3/uL (ref 0.00–0.07)
Basophils Absolute: 0 10*3/uL (ref 0.0–0.1)
Basophils Relative: 1 %
Eosinophils Absolute: 0.2 10*3/uL (ref 0.0–0.5)
Eosinophils Relative: 3 %
HCT: 39.8 % (ref 36.0–46.0)
Hemoglobin: 13.1 g/dL (ref 12.0–15.0)
Immature Granulocytes: 0 %
Lymphocytes Relative: 18 %
Lymphs Abs: 1.4 10*3/uL (ref 0.7–4.0)
MCH: 28.2 pg (ref 26.0–34.0)
MCHC: 32.9 g/dL (ref 30.0–36.0)
MCV: 85.6 fL (ref 80.0–100.0)
Monocytes Absolute: 0.5 10*3/uL (ref 0.1–1.0)
Monocytes Relative: 6 %
Neutro Abs: 5.6 10*3/uL (ref 1.7–7.7)
Neutrophils Relative %: 72 %
Platelet Count: 250 10*3/uL (ref 150–400)
RBC: 4.65 MIL/uL (ref 3.87–5.11)
RDW: 13.1 % (ref 11.5–15.5)
WBC Count: 7.7 10*3/uL (ref 4.0–10.5)
nRBC: 0 % (ref 0.0–0.2)

## 2021-05-19 LAB — CMP (CANCER CENTER ONLY)
ALT: 15 U/L (ref 0–44)
AST: 14 U/L — ABNORMAL LOW (ref 15–41)
Albumin: 3.8 g/dL (ref 3.5–5.0)
Alkaline Phosphatase: 65 U/L (ref 38–126)
Anion gap: 7 (ref 5–15)
BUN: 14 mg/dL (ref 6–20)
CO2: 27 mmol/L (ref 22–32)
Calcium: 9.1 mg/dL (ref 8.9–10.3)
Chloride: 105 mmol/L (ref 98–111)
Creatinine: 0.65 mg/dL (ref 0.44–1.00)
GFR, Estimated: >60 mL/min (ref >60)
Glucose, Bld: 189 mg/dL — ABNORMAL HIGH (ref 70–99)
Potassium: 4.2 mmol/L (ref 3.5–5.1)
Sodium: 139 mmol/L (ref 135–145)
Total Bilirubin: 0.4 mg/dL (ref 0.3–1.2)
Total Protein: 6.8 g/dL (ref 6.5–8.1)

## 2021-05-19 NOTE — Progress Notes (Signed)
?[place]  ?[phone]) [phone] [phone]  ? ? ? ?ID: Roosvelt Maser DOB: [birth year]  MR#: [id number]  JYN#:[id number] ? ?Patient Care Team: ?Jearld Fenton, NP as PCP - General (Internal Medicine) ?Nelva Bush, MD as PCP - Cardiology (Cardiology) ?Magrinat, Virgie Dad, MD (Inactive) as Consulting Physician (Oncology) ?Coralie Keens, MD as Consulting Physician (General Surgery) ?Thurnell Lose, MD as Consulting Physician (Obstetrics and Gynecology) ?Gery Pray, MD as Consulting Physician (Radiation Oncology) ?Renato Shin, MD as Consulting Physician (Endocrinology) ?Irene Shipper, MD as Consulting Physician (Gastroenterology) ?Irene Limbo, MD as Consulting Physician (Plastic Surgery) ?Dillingham, Loel Lofty, DO as Attending Physician (Plastic Surgery) ?Nelva Bush, MD as Consulting Physician (Cardiology) ?OTHER MD: ? ? ?CHIEF COMPLAINT: Estrogen receptor positive invasive breast cancer ? ?CURRENT TREATMENT: Tamoxifen ? ? ?INTERVAL HISTORY: ?Alejandra returns today for follow-up of her estrogen receptor positive breast cancer. ?She completed 5 years of tamoxifen and tolerated it well.  She is quite displeased with the plastic surgery outcome in the left breast but she says it is what it is.  She denies any new breast changes.  No change in rest of her health.  No change in bowel habits or urinary habits.  No new neurological complaints. ?Rest of the pertinent 10 point ROS reviewed and negative ? ? ? COVID 19 VACCINATION STATUS: [place] x2, no booster as of August 2022 ? ? ?BREAST CANCER HISTORY: ?From the original intake note: ? 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 She had left lumpectomy for that lesion 01/06/2013, showing a low-grade noninvasive ductal carcinoma which was estrogen receptor 99% and progesterone receptor 100% positive. Margins were focally positive and she had further excision 01/22/2013 which  showed residual focal ductal carcinoma in situ 1 mm from the posterior margin. The patient refused adjuvant radiation and did not tolerate tamoxifen, which she took for approximately 2 months. She was last seen in our office in 2015. ? ?Because of that history she receives diagnostic bilateral mammography yearly. Her 02/15/2015 study found the breast density to be category A. This study was negative. However in early August 2017 she developed a new left nipple discharge, which at times was bloody. Repeat left mammography with tomography and left breast ultrasonography 08/30/2015 now read the breast density to be category B. Aside some medial left breast masses and postlumpectomy findings, none of which showed any change since 2012, there were new pleomorphic calcifications posterior to the lumpectomy area 1.4 cm. On exam there was no palpable lump. Ultrasound showed scarring behind the nipple which was unchanged from prior, but no definite mass. ? ?Biopsy of the left breast upper outer quadrant area of calcifications 09/02/2015 showed ([id number]) ductal carcinoma in situ, grade 1. Focal invasion could not be excluded. There was not enough tissue for a prognostic panel. ? ?On 09/10/2015 the patient underwent bilateral breast MRI with and without contrast. This again found the breast density to be category B. There was an area of non-masslike enhancement involving the upper outer quadrant of the left breast from the nipple posteriorly extending approximately 10 cm. The right breast was unremarkable. There was no evidence of adenopathy. MRI guided biopsy of the upper-outer quadrant retroareolar area of the left breast 09/16/2015 showed fat necrosis but no evidence of malignancy ([id number]). ? ?Shanta's subsequent history is as detailed below ? ? ?PAST MEDICAL HISTORY: ?Past Medical History:  ?Diagnosis Date  ? Allergy   ? seasonal  ?  BRCA negative 2017  ? Breast cancer ([place]) 2014  ? Breast cancer ([place]) 2017  ?  Bronchitis   ? hx of  ? Coronary artery disease 06/2019  ? 80% mid RCA stenosis - medical management  ? Diabetes mellitus, type II ([place])   ? Endometrial hyperplasia 2014  ? Mirena placed; Dr. Carren Rang  ? GERD (gastroesophageal reflux disease)   ? Headache   ? History of radiation therapy 12/01/15-01/11/16  ? Left breast DIBH / 50.4 Gy in 28 fractions and Left breast boost / 12 Gy in 6 fractions  ? Hyperlipidemia   ? Hypertension   ? Hypothyroidism   ? Joint pain   ? Obesity   ? PCOS (polycystic ovarian syndrome)   ? Personal history of radiation therapy 2017  ? Pneumonia   ? as a child  ? Sleep apnea 2008  ? severe OSA-does not use a cpap  ? Snoring   ? Thyroid cancer ([place])   ? 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 Buddy Duty  ? ? ?PAST SURGICAL HISTORY: ?Past Surgical History:  ?Procedure Laterality Date  ? APPLICATION OF A-CELL OF EXTREMITY Left 09/29/2020  ? Procedure: placement of ACell;  Surgeon: Wallace Going, DO;  Location: [facility];  Service: Plastics;  Laterality: Left;  ? BREAST BIOPSY  2000  ? s/p  ? BREAST BIOPSY Left 01/22/2013  ? Procedure: RE-EXCISION LEFT BREAST DCIS;  Surgeon: Harl Bowie, MD;  Location: [facility];  Service: General;  Laterality: Left;  ? BREAST LUMPECTOMY Left 01/06/2013  ? Procedure: LUMPECTOMY;  Surgeon: Harl Bowie, MD;  Location: [facility];  Service: General;  Laterality: Left;  ? BREAST LUMPECTOMY Left 2017  ? BREAST LUMPECTOMY WITH NEEDLE LOCALIZATION AND AXILLARY SENTINEL LYMPH NODE BX Left 09/30/2015  ? Procedure: Left BREAST LUMPECTOMY WITH NEEDLE LOCALIZATION AND AXILLARY SENTINEL LYMPH NODE BX;  Surgeon: Coralie Keens, MD;  Location: [facility];  Service: General;  Laterality: Left;  ? BREAST RECONSTRUCTION Bilateral 10/12/2015  ? Procedure: BREAST oncoplastic RECONSTRUCTION;  Surgeon: Irene Limbo, MD;  Location: [facility];   Service: Plastics;  Laterality: Bilateral;  ? BREAST REDUCTION SURGERY Bilateral 10/12/2015  ? Procedure: MAMMARY REDUCTION  (BREAST);  Surgeon: Irene Limbo, MD;  Location: [facility];  Service: Plastics;  Laterality: Bilateral;  ? CORONARY ANGIOPLASTY    ? DILATION AND CURETTAGE OF UTERUS  2004  ? s/p  ? EXCISION OF BREAST LESION Left 10/12/2015  ? Procedure: Re-EXCISION OF BREAST;  Surgeon: Coralie Keens, MD;  Location: [facility];  Service: General;  Laterality: Left;  ? INCISION AND DRAINAGE OF WOUND Left 09/29/2020  ? Procedure: Excision of back and left breast wound;  Surgeon: Wallace Going, DO;  Location: [facility];  Service: Plastics;  Laterality: Left;  1 hour  ? LATISSIMUS FLAP TO BREAST Left 07/19/2020  ? Procedure: Left breast radiation necrosis excision with latissimus muscle flap;  Surgeon: Wallace Going, DO;  Location: [facility];  Service: Plastics;  Laterality: Left;  3 hours  ? LEFT HEART CATH AND CORONARY ANGIOGRAPHY N/A 06/27/2019  ? Procedure: LEFT HEART CATH AND CORONARY ANGIOGRAPHY;  Surgeon: Nelva Bush, MD;  Location: [facility];  Service: Cardiovascular;  Laterality: N/A;  ? REDUCTION MAMMAPLASTY Bilateral 09/2015  ? THYROIDECTOMY  02/2009  ? Dr Harlow Asa  ? TONSILLECTOMY  1982  ? TONSILLECTOMY    ? ? ?  FAMILY HISTORY ?Family History  ?Problem Relation Age of Onset  ? CAD Mother 57  ?     Died age 80 of CAD  ? Heart disease Mother   ?     CABG x 2  ? Graves' disease Mother   ? Hypertension Mother   ? Thyroid disease Mother   ? Heart attack Mother 43  ? Dementia Father   ?     deceased age 75 secondary to dementia  ? Bipolar disorder Father   ? Emphysema Father   ? Heart disease Father   ? COPD Father   ?     smoker  ? Hypertension Father   ? Depression Father   ? Alcoholism Father   ? Other Sister 34  ?     hysterectomy for fibroids  ? Heart disease Sister   ?     CABG x 2  ? Heart attack Sister 58  ? Prostate cancer  Brother   ?     low grade; w/ surveillance  ? Thyroid nodules Brother 32  ? Heart disease Brother 44  ?     CABG x 4  ? CAD Maternal Grandfather 60  ? Heart disease Maternal Grandfather   ? Heart attack Maternal Gr

## 2021-06-06 ENCOUNTER — Other Ambulatory Visit: Payer: Self-pay

## 2021-06-06 ENCOUNTER — Other Ambulatory Visit: Payer: Self-pay | Admitting: Endocrinology

## 2021-06-06 MED ORDER — DAPAGLIFLOZIN PROPANEDIOL 5 MG PO TABS
ORAL_TABLET | Freq: Every day | ORAL | 0 refills | Status: DC
  Filled 2021-06-06: qty 90, 90d supply, fill #0

## 2021-06-07 ENCOUNTER — Encounter: Payer: Self-pay | Admitting: Endocrinology

## 2021-06-07 ENCOUNTER — Other Ambulatory Visit: Payer: Self-pay

## 2021-06-07 ENCOUNTER — Ambulatory Visit: Payer: No Typology Code available for payment source | Admitting: Endocrinology

## 2021-06-07 VITALS — BP 136/84 | HR 90 | Ht 64.0 in | Wt 207.4 lb

## 2021-06-07 DIAGNOSIS — E1142 Type 2 diabetes mellitus with diabetic polyneuropathy: Secondary | ICD-10-CM | POA: Diagnosis not present

## 2021-06-07 DIAGNOSIS — C73 Malignant neoplasm of thyroid gland: Secondary | ICD-10-CM

## 2021-06-07 LAB — MICROALBUMIN / CREATININE URINE RATIO
Creatinine,U: 51.9 mg/dL
Microalb Creat Ratio: 1.7 mg/g (ref 0.0–30.0)
Microalb, Ur: 0.9 mg/dL (ref 0.0–1.9)

## 2021-06-07 LAB — POCT GLUCOSE (DEVICE FOR HOME USE): Glucose Fasting, POC: 146 mg/dL — AB (ref 70–99)

## 2021-06-07 LAB — POCT GLYCOSYLATED HEMOGLOBIN (HGB A1C): Hemoglobin A1C: 7.1 % — AB (ref 4.0–5.6)

## 2021-06-07 LAB — TSH: TSH: 0.04 u[IU]/mL — ABNORMAL LOW (ref 0.35–5.50)

## 2021-06-07 LAB — T4, FREE: Free T4: 1.55 ng/dL (ref 0.60–1.60)

## 2021-06-07 MED ORDER — FREESTYLE LIBRE 3 SENSOR MISC
1.0000 | 2 refills | Status: DC
  Filled 2021-06-07: qty 2, 28d supply, fill #0
  Filled 2021-07-01: qty 2, 28d supply, fill #1
  Filled 2021-07-30: qty 2, 28d supply, fill #2

## 2021-06-07 NOTE — Progress Notes (Signed)
Patient ID: Stacey Roberts, female   DOB: 12-03-1965, 56 y.o.   MRN: [id number] ? ?       ? ? ? ?Reason for Appointment: Type II Diabetes follow-up  ? ?History of Present Illness  ? ?Diagnosis date: 2008 ?Previous history: ?She has been on insulin since 2017 ?Previous A1c range = 7.3-10.4 ? ?Recent history: ?    ?Non-insulin hypoglycemic drugs: Ozempic 37m weekly ?    ?Insulin regimen:  35-50 units bioequivalent Lantus in the morning daily        ?   ?Side effects from medications: Mild nausea from Ozempic, also nausea from Trulicity ? ?Current self management, blood sugar patterns and problems identified: ? ?She was started on Ozempic in 2021 and this has been increased periodically and her last dose of 2 mg was started in 2/23 ?She does have nausea with this but more recently this is only mild and only for the first 2 or 3 days after the injection which is tolerable  ?This helps her with satiety ?She also apparently has lost weight progressively with Ozempic ? ?She is taking variable doses of basal insulin based on daily fasting blood sugar with a range of doses of 35-50 units ?She will take smaller doses if the blood sugars are near normal but if blood sugars are over 200 she will take as much as 50 units ?No recent hypoglycemia overnight but may have high readings after meals which are not being checked regularly ?Has never been on mealtime insulin ?Checks her blood sugar mostly in the morning and occasionally after dinner ?She was given a freestyle libre sensor last year but since it fell off and the cost was high as she has not pursued this again ? ?Exercise: No structured exercise and may walk a little ? ?Diet management: She is eating very small portions but not always appearing to be getting protein and sometimes may just eat an English muffin or an apple for breakfast or snack ?Otherwise she tends to get some protein ?     ?Monitors blood glucose: Once a day.    Glucometer: One Touch.          ? ?Blood  Glucose readings from recall: ? ?PRE-MEAL Fasting Lunch Dinner Bedtime Overall  ?Glucose range: 120-205      ?Mean/median:       ? ?POST-MEAL PC Breakfast PC Lunch PC Dinner  ?Glucose range:   160-180  ?Mean/median:     ? ? ? Hypoglycemia:  none ?             ?          ?Dietician visit: Most recent: 2011 with classes ?Weight control: ? ?Wt Readings from Last 3 Encounters:  ?06/07/21 207 lb 6.4 oz (94.1 kg)  ?05/19/21 207 lb 3.2 oz (94 kg)  ?03/10/21 212 lb 12.8 oz (96.5 kg)  ?      ?  ?Complications:    ? ?Diabetes labs: ? ?Lab Results  ?Component Value Date  ? HGBA1C 7.1 (A) 06/07/2021  ? HGBA1C 7.6 (A) 03/10/2021  ? HGBA1C 8.1 (A) 12/08/2020  ? ?Lab Results  ?Component Value Date  ? MICROALBUR 0.7 04/02/2015  ? LDLCALC 44 02/04/2021  ? CREATININE 0.65 05/19/2021  ? ? ? ?Allergies as of 06/07/2021   ? ?   Reactions  ? Ace Inhibitors Cough  ? REACTION: dry cough  ? Penicillins Shortness Of Breath, Rash  ? Pt has tolerated Cephalexin in 2017 and again in 2018.  ?  Has patient had a PCN reaction causing immediate rash, facial/tongue/throat swelling, SOB or lightheadedness with hypotension: Yes ?Has patient had a PCN reaction causing severe rash involving mucus membranes or skin necrosis: Yes ?Has patient had a PCN reaction that required hospitalization No ?Has patient had a PCN reaction occurring within the last 10 years: No ?If all of the above answers are "NO", then may proceed with Cephalosporin use.  ? Clindamycin/lincomycin Other (See Comments)  ? Severe stomach cramps  ? Ciprofloxacin Itching  ? ?  ? ?  ?Medication List  ?  ? ?  ? Accurate as of Jun 07, 2021  2:12 PM. If you have any questions, ask your nurse or doctor.  ?  ?  ? ?  ? ?aspirin EC 81 MG tablet ?Take 1 tablet (81 mg total) by mouth daily. ?  ?Farxiga 5 MG Tabs tablet ?Generic drug: dapagliflozin propanediol ?TAKE 1 TABLET BY MOUTH DAILY. ?  ?freestyle lancets ?1 each by Other route 2 (two) times daily. [name] ?  ?FreeStyle Libre 2 Sensor Misc ?Change  device every 14 days ?  ?FreeStyle Lite Devi ?1 each by Does not apply route 2 (two) times daily. [name] ?  ?FREESTYLE LITE test strip ?Generic drug: glucose blood ?Use 2 (two) times daily. (dx [name]) ?  ?gabapentin 300 MG capsule ?Commonly known as: NEURONTIN ?Take 600 mg by mouth at bedtime as needed. ?  ?levothyroxine 137 MCG tablet ?Commonly known as: SYNTHROID ?TAKE 1 TABLET (137 MCG TOTAL) BY MOUTH DAILY BEFORE BREAKFAST. ?  ?losartan 50 MG tablet ?Commonly known as: COZAAR ?TAKE 1 TABLET (50 MG TOTAL) BY MOUTH DAILY. ?  ?metFORMIN 500 MG 24 hr tablet ?Commonly known as: GLUCOPHAGE-XR ?TAKE 4 TABLETS BY MOUTH DAILY WITH BREAKFAST ?  ?metoprolol tartrate 100 MG tablet ?Commonly known as: LOPRESSOR ?Take 1 tablet (100 mg total) by mouth 2 (two) times daily. ?  ?multivitamin with minerals tablet ?Take 1 tablet by mouth daily. Woman's Gummies ?  ?nitroGLYCERIN 0.4 MG SL tablet ?Commonly known as: Nitrostat ?Place 1 tablet (0.4 mg total) under the tongue every 5 (five) minutes as needed for chest pain. ?  ?Ozempic (2 MG/DOSE) 8 MG/3ML Sopn ?Generic drug: Semaglutide (2 MG/DOSE) ?Inject 2 mg into the skin once a week. ?  ?pantoprazole 40 MG tablet ?Commonly known as: PROTONIX ?TAKE 1 TABLET BY MOUTH DAILY. ?  ?PARoxetine 10 MG tablet ?Commonly known as: Paxil ?Take 1 tablet (10 mg total) by mouth daily. ?  ?pregabalin 75 MG capsule ?Commonly known as: LYRICA ?Take 1 capsule (75 mg total) by mouth daily. ?  ?rosuvastatin 20 MG tablet ?Commonly known as: CRESTOR ?TAKE 1 TABLET BY MOUTH DAILY. ?  ?Semglee (yfgn) 100 UNIT/ML Pen ?Generic drug: insulin glargine-yfgn ?Inject 45 Units into the skin every morning. ?  ?Unifine Pentips 31G X 8 MM Misc ?Generic drug: Insulin Pen Needle ?use to inject twice a day ?  ? ?  ? ? ?Allergies:  ?Allergies  ?Allergen Reactions  ? Ace Inhibitors Cough  ?  REACTION: dry cough  ? Penicillins Shortness Of Breath and Rash  ?  Pt has tolerated Cephalexin in 2017 and again in 2018.  ? ?Has  patient had a PCN reaction causing immediate rash, facial/tongue/throat swelling, SOB or lightheadedness with hypotension: Yes ?Has patient had a PCN reaction causing severe rash involving mucus membranes or skin necrosis: Yes ?Has patient had a PCN reaction that required hospitalization No ?Has patient had a PCN reaction occurring within the last 10 years:  No ?If all of the above answers are "NO", then may proceed with Cephalosporin use. ?  ? Clindamycin/Lincomycin Other (See Comments)  ?  Severe stomach cramps  ? Ciprofloxacin Itching  ? ? ?Past Medical History:  ?Diagnosis Date  ? Allergy   ? seasonal  ? BRCA negative 2017  ? Breast cancer ([place]) 2014  ? Breast cancer ([place]) 2017  ? Bronchitis   ? hx of  ? Coronary artery disease 06/2019  ? 80% mid RCA stenosis - medical management  ? Diabetes mellitus, type II ([place])   ? Endometrial hyperplasia 2014  ? Mirena placed; Dr. Carren Rang  ? GERD (gastroesophageal reflux disease)   ? Headache   ? History of radiation therapy 12/01/15-01/11/16  ? Left breast DIBH / 50.4 Gy in 28 fractions and Left breast boost / 12 Gy in 6 fractions  ? Hyperlipidemia   ? Hypertension   ? Hypothyroidism   ? Joint pain   ? Obesity   ? PCOS (polycystic ovarian syndrome)   ? Personal history of radiation therapy 2017  ? Pneumonia   ? as a child  ? Sleep apnea 2008  ? severe OSA-does not use a cpap  ? Snoring   ? Thyroid cancer ([place])   ? 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 Buddy Duty  ? ? ?Past Surgical History:  ?Procedure Laterality Date  ? APPLICATION OF A-CELL OF EXTREMITY Left 09/29/2020  ? Procedure: placement of ACell;  Surgeon: Wallace Going, DO;  Location: [facility];  Service: Plastics;  Laterality: Left;  ? BREAST BIOPSY  2000  ? s/p  ? BREAST BIOPSY Left 01/22/2013  ? Procedure: RE-EXCISION LEFT BREAST DCIS;  Surgeon: Harl Bowie, MD;  Location: [facility];  Service:  General;  Laterality: Left;  ? BREAST LUMPECTOMY Left 01/06/2013  ? Procedure: LUMPECTOMY;  Surgeon: Harl Bowie, MD;  Location: [facility];  Service: General;  Laterality: Left;  ? BREAST LUMPECTOMY Left

## 2021-06-07 NOTE — Patient Instructions (Addendum)
[employer] from [employer] for [place] [facility]  ? ?Add protein to each meal ? ?Take '10mg'$  [place] ? ?Exercise! ? ?Check blood sugars on waking up 7 days a week ? ?Also check blood sugars about 2 hours after meals and do this after different meals by rotation ? ?Recommended blood sugar levels on waking up are 90-130 and about 2 hours after meal is 130-160 ? ?Please bring your blood sugar monitor to each visit, thank you ? ?40 Lantus daily only change if sugar out of range > 3 days ?

## 2021-06-08 ENCOUNTER — Other Ambulatory Visit: Payer: Self-pay

## 2021-06-08 LAB — THYROGLOBULIN BY IMA: Thyroglobulin by IMA: <0.1 ng/mL — ABNORMAL LOW (ref 1.5–38.5)

## 2021-06-08 LAB — TGAB+THYROGLOBULIN IMA OR LCMS: Thyroglobulin Antibody: <1 IU/mL (ref 0.0–0.9)

## 2021-06-08 MED ORDER — LEVOTHYROXINE SODIUM 112 MCG PO TABS
112.0000 ug | ORAL_TABLET | Freq: Every day | ORAL | 3 refills | Status: DC
  Filled 2021-06-08: qty 90, 90d supply, fill #0
  Filled 2021-08-31: qty 90, 90d supply, fill #1
  Filled 2021-11-28: qty 90, 90d supply, fill #2
  Filled 2022-02-27: qty 90, 90d supply, fill #3

## 2021-06-08 NOTE — Addendum Note (Signed)
Addended by: Cinda Quest on: 06/08/2021 08:11 AM ? ? Modules accepted: Orders ? ?

## 2021-06-09 ENCOUNTER — Ambulatory Visit: Payer: No Typology Code available for payment source | Admitting: Endocrinology

## 2021-06-14 ENCOUNTER — Encounter: Payer: Self-pay | Admitting: Endocrinology

## 2021-06-24 ENCOUNTER — Encounter: Payer: Self-pay | Admitting: Emergency Medicine

## 2021-06-24 ENCOUNTER — Ambulatory Visit
Admission: EM | Admit: 2021-06-24 | Discharge: 2021-06-24 | Disposition: A | Discharge status: Home / Self Care | Payer: No Typology Code available for payment source | Attending: Emergency Medicine | Admitting: Emergency Medicine

## 2021-06-24 ENCOUNTER — Other Ambulatory Visit: Payer: Self-pay

## 2021-06-24 DIAGNOSIS — I10 Essential (primary) hypertension: Secondary | ICD-10-CM | POA: Diagnosis not present

## 2021-06-24 DIAGNOSIS — L03213 Periorbital cellulitis: Secondary | ICD-10-CM

## 2021-06-24 MED ORDER — DOXYCYCLINE HYCLATE 100 MG PO CAPS
100.0000 mg | ORAL_CAPSULE | Freq: Two times a day (BID) | ORAL | 0 refills | Status: AC
  Filled 2021-06-24: qty 14, 7d supply, fill #0

## 2021-06-24 NOTE — Discharge Instructions (Addendum)
Take the doxycycline as directed.  Go to the emergency department if your symptoms worsen.  Follow up with your primary care provider on Monday for a recheck.    Your blood pressure is elevated today at 146/97.  Please have this rechecked by your primary care provider in 2-4 weeks.

## 2021-06-24 NOTE — ED Provider Notes (Signed)
Roderic Palau    CSN: [id number] Arrival date & time: 06/24/21  0815      History   Chief Complaint Chief Complaint  Patient presents with   Eye Problem    HPI Stacey Roberts is a 56 y.o. female.  Patient presents with 2-day history of redness and swelling around her right eye.  No eye injury.  She has mildly blurred vision; No other vision changes.  No eye drainage, fever, chills, or other symptoms.  Treatment at home with warm compresses.  She recently traveled to [place].  Her medical history includes hypertension, CAD, diabetes, breast cancer, thyroid cancer, morbid obesity.   The history is provided by the patient and medical records.   Past Medical History:  Diagnosis Date   Allergy    seasonal   BRCA negative 2017   Breast cancer ([place]) 2014   Breast cancer ([place]) 2017   Bronchitis    hx of   Coronary artery disease 06/2019   80% mid RCA stenosis - medical management   Diabetes mellitus, type II ([place])    Endometrial hyperplasia 2014   Mirena placed; Dr. 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 Buddy Duty    Patient Active Problem List   Diagnosis Date Noted   History of tonsillectomy 04/21/2021   PSVT (paroxysmal supraventricular tachycardia) ([place]) 11/24/2020   Coronary artery disease of native artery of native heart with stable angina pectoris (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   Hyperlipidemia associated with type 2 diabetes mellitus ([place]) 12/18/2006   Essential hypertension 12/13/2006    Past Surgical History:  Procedure Laterality Date   APPLICATION OF A-CELL OF EXTREMITY Left 09/29/2020   Procedure: placement of ACell;  Surgeon: Wallace Going, DO;  Location: [facility];  Service: Plastics;  Laterality: Left;   BREAST BIOPSY  2000   s/p   BREAST BIOPSY Left 01/22/2013   Procedure: RE-EXCISION LEFT BREAST DCIS;  Surgeon: Harl Bowie, MD;  Location: [facility];  Service: General;  Laterality: Left;   BREAST LUMPECTOMY Left 01/06/2013   Procedure: LUMPECTOMY;  Surgeon: Harl Bowie, MD;  Location: [place];  Service: General;  Laterality: Left;   BREAST LUMPECTOMY Left 2017   BREAST LUMPECTOMY WITH NEEDLE LOCALIZATION AND AXILLARY SENTINEL LYMPH NODE BX Left 09/30/2015   Procedure: Left BREAST LUMPECTOMY WITH NEEDLE LOCALIZATION AND AXILLARY SENTINEL LYMPH NODE BX;  Surgeon: Coralie Keens, MD;  Location: [facility];  Service: General;  Laterality: Left;   BREAST RECONSTRUCTION Bilateral 10/12/2015   Procedure: BREAST oncoplastic RECONSTRUCTION;  Surgeon: Irene Limbo, MD;  Location: [facility];  Service: Plastics;  Laterality: Bilateral;   BREAST REDUCTION SURGERY Bilateral 10/12/2015   Procedure: MAMMARY REDUCTION  (BREAST);  Surgeon: Irene Limbo, MD;  Location: [facility];  Service: Plastics;  Laterality: Bilateral;   CORONARY ANGIOPLASTY     DILATION AND CURETTAGE OF UTERUS  2004   s/p   EXCISION OF BREAST LESION Left 10/12/2015   Procedure: Re-EXCISION OF BREAST;  Surgeon: Coralie Keens, MD;  Location: [facility];   Service: General;  Laterality: Left;   INCISION AND DRAINAGE OF WOUND Left 09/29/2020   Procedure: Excision of back and left breast wound;  Surgeon: Wallace Going, DO;  Location: [facility];  Service: Plastics;  Laterality: Left;  1 hour   LATISSIMUS FLAP TO BREAST Left 07/19/2020   Procedure: Left breast radiation necrosis excision with latissimus muscle flap;  Surgeon: Wallace Going, DO;  Location: [facility];  Service: Plastics;  Laterality: Left;  3 hours   LEFT HEART CATH AND CORONARY ANGIOGRAPHY N/A 06/27/2019   Procedure: LEFT HEART CATH AND CORONARY ANGIOGRAPHY;  Surgeon: Nelva Bush, MD;  Location: [facility];  Service: Cardiovascular;  Laterality: N/A;   REDUCTION MAMMAPLASTY Bilateral 09/2015   THYROIDECTOMY  02/2009   Dr Harlow Asa   TONSILLECTOMY  1982   TONSILLECTOMY      OB History     Gravida  0   Para  0   Term  0   Preterm  0   AB  0   Living  0      SAB  0   IAB  0   Ectopic  0   Multiple  0   Live Births  0            Home Medications    Prior to Admission medications   Medication Sig Start Date End Date Taking? Authorizing Provider  doxycycline (VIBRAMYCIN) 100 MG capsule Take 1 capsule (100 mg total) by mouth 2 (two) times daily for 7 days. 06/24/21 07/01/21 Yes Sharion Balloon, NP  aspirin EC 81 MG tablet Take 1 tablet (81 mg total) by mouth daily. 05/23/19   End, Harrell Gave, MD  Blood Glucose Monitoring Suppl (FREESTYLE LITE) DEVI 1 each by Does not apply route 2 (two) times daily. [name] 02/03/19   Renato Shin, MD  Continuous Blood Gluc Sensor (FREESTYLE LIBRE 3 SENSOR) MISC 1 Device by Does not apply route every 14 (fourteen) days. Apply 1 sensor on upper arm every 14 days for continuous glucose monitoring 06/07/21   Elayne Snare, MD  dapagliflozin propanediol (FARXIGA) 5 MG TABS tablet TAKE 1 TABLET BY MOUTH DAILY. 06/06/21 06/06/22  Shamleffer, Melanie Crazier, MD  gabapentin (NEURONTIN) 300 MG capsule Take 600 mg  by mouth at bedtime as needed.    [provider]  glucose blood (FREESTYLE LITE) test strip Use 2 (two) times daily. (dx [name]) 04/21/21   Renato Shin, MD  insulin glargine-yfgn (SEMGLEE) 100 UNIT/ML Pen Inject 45 Units into the skin every morning. 09/07/20   Renato Shin, MD  Insulin Pen Needle (UNIFINE PENTIPS) 31G X 8 MM MISC use to inject twice a day 06/10/20   Renato Shin, MD  Lancets (FREESTYLE) lancets 1 each by Other route 2 (two) times daily. [name] 02/03/19   Renato Shin, MD  levothyroxine (SYNTHROID) 112 MCG tablet Take 1 tablet (112 mcg total) by mouth daily. 06/08/21   Elayne Snare, MD  levothyroxine (SYNTHROID)  137 MCG tablet TAKE 1 TABLET (137 MCG TOTAL) BY MOUTH DAILY BEFORE BREAKFAST. 04/04/21 04/04/22  Renato Shin, MD  losartan (COZAAR) 50 MG tablet TAKE 1 TABLET (50 MG TOTAL) BY MOUTH DAILY. 03/08/21 03/08/22  Jearld Fenton, NP  metFORMIN (GLUCOPHAGE-XR) 500 MG 24 hr tablet TAKE 4 TABLETS BY MOUTH DAILY WITH BREAKFAST 09/03/20 09/05/21  Renato Shin, MD  metoprolol tartrate (LOPRESSOR) 100 MG tablet Take 1 tablet (100 mg total) by mouth 2 (two) times daily. 11/24/20 08/21/21  End, Harrell Gave, MD  Multiple Vitamins-Minerals (MULTIVITAMIN WITH MINERALS) tablet Take 1 tablet by mouth daily. Woman's Gummies    [provider]  nitroGLYCERIN (NITROSTAT) 0.4 MG SL tablet Place 1 tablet (0.4 mg total) under the tongue every 5 (five) minutes as needed for chest pain. 06/27/19   End, Harrell Gave, MD  pantoprazole (PROTONIX) 40 MG tablet TAKE 1 TABLET BY MOUTH DAILY. 04/20/21 04/20/22  Jearld Fenton, NP  PARoxetine (PAXIL) 10 MG tablet Take 1 tablet (10 mg total) by mouth daily. Patient not taking: Reported on 06/07/2021 02/04/21   Jearld Fenton, NP  pregabalin (LYRICA) 75 MG capsule Take 1 capsule (75 mg total) by mouth daily. 12/07/20   Hyatt, Max T, DPM  rosuvastatin (CRESTOR) 20 MG tablet TAKE 1 TABLET BY MOUTH DAILY. 07/01/20 07/14/21  Dutch Quint B, FNP  Semaglutide, 2  MG/DOSE, (OZEMPIC, 2 MG/DOSE,) 8 MG/3ML SOPN Inject 2 mg into the skin once a week. 03/10/21   Renato Shin, MD    Family History Family History  Problem Relation Age of Onset   CAD Mother 30       Died age 18 of CAD   Heart disease Mother        CABG x 2   Graves' disease Mother    Hypertension Mother    Thyroid disease Mother    Heart attack Mother 41   Dementia Father        deceased age 24 secondary to dementia   Bipolar disorder Father    Emphysema Father    Heart disease Father    COPD Father        smoker   Hypertension Father    Depression Father    Alcoholism Father    Other Sister 39       hysterectomy for fibroids   Heart disease Sister        CABG x 2   Heart attack Sister 67   Prostate cancer Brother        low grade; w/ surveillance   Thyroid nodules Brother 64   Heart disease Brother 78       CABG x 4   CAD Maternal Grandfather 22   Heart disease Maternal Grandfather    Heart attack Maternal Grandfather 5   Kidney cancer Cousin        maternal 1st cousin dx 59-47; former smoker   Fibroids Other        niece dx approx 51   Hypertension Other    Lung cancer Other        maternal great aunt (MGM's sister); not a smoker   Cancer Other        nephew dx neuroblastoma at 77.79 years old   Colon cancer Neg Hx    Colon polyps Neg Hx     Social History Social History   Tobacco Use   Smoking status: Former    Packs/day: 0.50    Types: Cigarettes    Quit date: 09/29/2007  Years since quitting: 13.7   Smokeless tobacco: Never   Tobacco comments:    Smoked on and off for 20 years. Quit about 8 years ago (as of 08/2015)  Vaping Use   Vaping Use: Never used  Substance Use Topics   Alcohol use: Not Currently    Alcohol/week: 0.0 standard drinks   Drug use: No     Allergies   Ace inhibitors, Penicillins, Clindamycin/lincomycin, and Ciprofloxacin   Review of Systems Review of Systems  Constitutional:  Negative for chills and fever.  HENT:   Negative for ear pain and sore throat.   Eyes:  Positive for redness and visual disturbance. Negative for pain, discharge and itching.  Respiratory:  Negative for cough and shortness of breath.   Cardiovascular:  Negative for chest pain and palpitations.  Gastrointestinal:  Negative for diarrhea and vomiting.  Skin:  Negative for color change and rash.  All other systems reviewed and are negative.   Physical Exam Triage Vital Signs ED Triage Vitals  Enc Vitals Group     BP      Pulse      Resp      Temp      Temp src      SpO2      Weight      Height      Head Circumference      Peak Flow      Pain Score      Pain Loc      Pain Edu?      Excl. in [place]?    No data found.  Updated Vital Signs BP (!) 146/97 (BP Location: Left Arm)   Pulse 89   Temp 98.1 F (36.7 C) (Temporal)   Resp 18   LMP 12/23/2012   SpO2 98%   Visual Acuity Right Eye Distance: 20/30 Left Eye Distance: 20/30 Bilateral Distance: 20/25  Right Eye Near:   Left Eye Near:    Bilateral Near:     Physical Exam Vitals and nursing note reviewed.  Constitutional:      General: She is not in acute distress.    Appearance: Normal appearance. She is well-developed. She is not ill-appearing.  HENT:     Right Ear: Tympanic membrane normal.     Left Ear: Tympanic membrane normal.     Nose: Nose normal.     Mouth/Throat:     Mouth: Mucous membranes are moist.     Pharynx: Oropharynx is clear.  Eyes:     General: Vision grossly intact.        Right eye: No discharge.        Left eye: No discharge.     Extraocular Movements: Extraocular movements intact.     Conjunctiva/sclera: Conjunctivae normal.     Pupils: Pupils are equal, round, and reactive to light.     Comments: Generalized moderate right periorbital edema and erythema. No eye drainage.    Cardiovascular:     Rate and Rhythm: Normal rate and regular rhythm.     Heart sounds: Normal heart sounds.  Pulmonary:     Effort: Pulmonary effort is  normal. No respiratory distress.     Breath sounds: Normal breath sounds.  Musculoskeletal:     Cervical back: Neck supple.  Skin:    General: Skin is warm and dry.  Neurological:     Mental Status: She is alert.  Psychiatric:        Mood and Affect: Mood normal.  Behavior: Behavior normal.     UC Treatments / Results  Labs (all labs ordered are listed, but only abnormal results are displayed) Labs Reviewed - No data to display  EKG   Radiology No results found.  Procedures Procedures (including critical care time)  Medications Ordered in UC Medications - No data to display  Initial Impression / Assessment and Plan / UC Course  I have reviewed the triage vital signs and the nursing notes.  Pertinent labs & imaging results that were available during my care of the patient were reviewed by me and considered in my medical decision making (see chart for details).    Preseptal cellulitis of right eye.  Elevated blood pressure reading with hypertension.  Patient has allergies to several antibiotics.  Treating preseptal cellulitis today with doxycycline.  Instructed patient to follow-up with her PCP on Monday for recheck.  Strict ED precautions discussed.  Education provided on preseptal cellulitis.  Also discussed with patient that her blood pressure is elevated today and needs to be rechecked by her PCP in 2 to 4 weeks.  Education provided on managing hypertension.  Patient agrees to plan of care.  Final Clinical Impressions(s) / UC Diagnoses   Final diagnoses:  Preseptal cellulitis of right eye  Elevated blood pressure reading in office with diagnosis of hypertension     Discharge Instructions      Take the doxycycline as directed.  Go to the emergency department if your symptoms worsen.  Follow up with your primary care provider on Monday for a recheck.    Your blood pressure is elevated today at 146/97.  Please have this rechecked by your primary care provider  in 2-4 weeks.          ED Prescriptions     Medication Sig Dispense Auth. Provider   doxycycline (VIBRAMYCIN) 100 MG capsule Take 1 capsule (100 mg total) by mouth 2 (two) times daily for 7 days. 14 capsule Sharion Balloon, NP      PDMP not reviewed this encounter.   Sharion Balloon, NP 06/24/21 [phone]

## 2021-06-24 NOTE — ED Triage Notes (Signed)
Patient presents to Urgent Care with complaints of right eye swelling and pain x 2 days. Treating with warm compresses.

## 2021-06-28 ENCOUNTER — Other Ambulatory Visit: Payer: Self-pay

## 2021-06-28 MED ORDER — NEOMYCIN-POLYMYXIN-DEXAMETH 3.5-10000-0.1 OP OINT
TOPICAL_OINTMENT | OPHTHALMIC | 0 refills | Status: DC
  Filled 2021-06-28: qty 3.5, 7d supply, fill #0

## 2021-06-28 MED ORDER — NEOMYCIN-POLYMYXIN-DEXAMETH 3.5-10000-0.1 OP SUSP
OPHTHALMIC | 1 refills | Status: DC
  Filled 2021-06-28: qty 5, 12d supply, fill #0

## 2021-07-01 ENCOUNTER — Other Ambulatory Visit: Payer: Self-pay

## 2021-07-01 ENCOUNTER — Other Ambulatory Visit: Payer: Self-pay | Admitting: Family

## 2021-07-05 ENCOUNTER — Other Ambulatory Visit: Payer: Self-pay | Admitting: Internal Medicine

## 2021-07-05 ENCOUNTER — Other Ambulatory Visit: Payer: Self-pay

## 2021-07-05 ENCOUNTER — Other Ambulatory Visit: Payer: Self-pay | Admitting: Family

## 2021-07-06 ENCOUNTER — Other Ambulatory Visit: Payer: Self-pay

## 2021-07-06 MED FILL — Rosuvastatin Calcium Tab 20 MG: ORAL | 90 days supply | Qty: 90 | Fill #0 | Status: AC

## 2021-07-06 NOTE — Progress Notes (Signed)
Cardiology Office Note:    Date:  07/08/2021   ID:  Stacey Roberts, DOB 1965-12-20, MRN [id number]  PCP:  [name], NP  [facility] Cardiologist:  Yvonne Kendall, MD  [facility] Electrophysiologist:  None   Referring MD: [name], NP   Chief Complaint: 6 month follow-up  History of Present Illness:    Stacey Roberts is a 56 y.o. female with a hx of coronary artery disease with 80% mRCA stenosis medically managed, HTN, HLD, DM2, prior tobacco use, and breast cancer who presents for 6 month follow-up.   Presented to Stacey Roberts, ED April 2021 with chest pain palpitations.  ED work-up unrevealing.  She presented for clinic follow-up for chest pain and palpitations.  She was started on aspirin 81 mg daily and metoprolol tartrate 25 mg twice daily continue losartan.  She was recommended for echo and cardiac CTA.  Echo 05/29/2019 LVEF 60 to 65% with no regional wall motion abnormalities and trivial AI.  She had cardiac CTA with calcium score of 1373 placing her in the 99th percentile for age and sex.  Her FFR showed significant stenosis of the LCx, OM1, RCA she was recommended for cardiac catheterization.  LHC 06/27/2019 showing significant single-vessel CAD with calcified stenosis up to 80% involving mid portion of small RCA with mild nonobstructive disease of LAD and LCx.  Mildly elevated filling pressures.  Recommended for optimizing medical therapy, metoprolol increased to 50 mg twice daily.  Noted that if she had refractory symptoms despite maximal tolerated just at least 2 antianginal agents PCI to the RCA could be considered.  Heart monitor in 09/2020 for palpitations showed multiple episodes of pSVT and was recommended she take an additional metoprolol as needed.   Seen 11/2020 and was feeling fairly well, she reports minimal chest discomfort. Metoprolol was increased to 100mg  BID. No further intervention was recommended.   Today, the patient reports she is overall doing  well. She had a mastectomy last year on the left side. The patient denies chest discomfort. She has occasional fluttering in her chest. Episodes are very brief.  Has not needed an extra metoprolol. No shortness of breath. No LLE, orthopnea, pnd. Diet could be better. She walks a little, needs to walk more. [name] was 7.1. She has lost weight about 15 lbs since being on Ozempic for diabetes.   Past Medical History:  Diagnosis Date   Allergy    seasonal   BRCA negative 2017   Breast cancer (HCC) 2014   Breast cancer (HCC) 2017   Bronchitis    hx of   Coronary artery disease 06/2019   80% mid RCA stenosis - medical management   Diabetes mellitus, type II (HCC)    Endometrial hyperplasia 2014   Mirena placed; Dr. 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 Sharl Ma    Past Surgical History:  Procedure Laterality Date   APPLICATION OF A-CELL OF EXTREMITY Left 09/29/2020   Procedure: placement of ACell;  Surgeon: Peggye Form, DO;  Location: [facility];  Service: Plastics;  Laterality: Left;   BREAST BIOPSY  2000   s/p   BREAST BIOPSY Left 01/22/2013   Procedure: RE-EXCISION LEFT BREAST DCIS;  Surgeon: Shelly Rubenstein, MD;  Location: [facility];  Service: General;  Laterality: Left;   BREAST LUMPECTOMY Left 01/06/2013   Procedure: LUMPECTOMY;  Surgeon: Shelly Rubenstein, MD;  Location: [facility];   Service: General;  Laterality: Left;   BREAST LUMPECTOMY Left 2017   BREAST LUMPECTOMY WITH NEEDLE LOCALIZATION AND AXILLARY SENTINEL LYMPH NODE BX Left 09/30/2015   Procedure: Left BREAST LUMPECTOMY WITH NEEDLE LOCALIZATION AND AXILLARY SENTINEL LYMPH NODE BX;  Surgeon: [name], MD;  Location: [facility];  Service: General;  Laterality: Left;   BREAST RECONSTRUCTION Bilateral 10/12/2015   Procedure: BREAST oncoplastic RECONSTRUCTION;  Surgeon: [name], MD;  Location: [facility];  Service: Plastics;  Laterality: Bilateral;   BREAST REDUCTION SURGERY Bilateral 10/12/2015   Procedure: MAMMARY REDUCTION  (BREAST);  Surgeon: [name], MD;  Location: [facility];  Service: Plastics;  Laterality: Bilateral;   CORONARY ANGIOPLASTY     DILATION AND CURETTAGE OF UTERUS  2004   s/p   EXCISION OF BREAST LESION Left 10/12/2015   Procedure: Re-EXCISION OF BREAST;  Surgeon: [name], MD;  Location: [facility];  Service: General;  Laterality: Left;   INCISION AND DRAINAGE OF WOUND Left 09/29/2020   Procedure: Excision of back and left breast wound;  Surgeon: Peggye Form, DO;  Location: [facility];  Service: Plastics;  Laterality: Left;  1 hour   LATISSIMUS FLAP TO BREAST Left 07/19/2020   Procedure: Left breast radiation necrosis excision with latissimus muscle flap;  Surgeon: Peggye Form, DO;  Location: [facility];  Service: Plastics;  Laterality: Left;  3 hours   LEFT HEART CATH AND CORONARY ANGIOGRAPHY N/A 06/27/2019   Procedure: LEFT HEART CATH AND CORONARY ANGIOGRAPHY;  Surgeon: Yvonne Kendall, MD;  [facility];  Service: Cardiovascular;  Laterality: N/A;   REDUCTION MAMMAPLASTY Bilateral 09/2015   THYROIDECTOMY  02/2009   Dr Gerrit Friends   TONSILLECTOMY  1982   TONSILLECTOMY      Current Medications: Current Meds  Medication Sig   aspirin EC 81 MG tablet Take 1 tablet (81 mg total) by  mouth daily.   Blood Glucose Monitoring Suppl (FREESTYLE LITE) DEVI 1 each by Does not apply route 2 (two) times daily. [name]   Continuous Blood Gluc Sensor (FREESTYLE LIBRE 3 SENSOR) MISC 1 Device by Does not apply route every 14 (fourteen) days. Apply 1 sensor on upper arm every 14 days for continuous glucose monitoring   dapagliflozin propanediol (FARXIGA) 5 MG TABS tablet TAKE 1 TABLET BY MOUTH DAILY.   gabapentin (NEURONTIN) 300 MG capsule Take 600 mg by mouth at bedtime as needed.   glucose blood (FREESTYLE LITE) test strip Use 2 (two) times daily. (dx [name])   insulin glargine-yfgn (SEMGLEE) 100 UNIT/ML Pen Inject 45 Units into the skin every morning.   Insulin Pen Needle (UNIFINE PENTIPS) 31G X 8 MM MISC use to inject twice a day   Lancets (FREESTYLE) lancets 1 each by Other route 2 (two) times daily. [name]   levothyroxine (SYNTHROID) 112 MCG  tablet Take 1 tablet (112 mcg total) by mouth daily.   levothyroxine (SYNTHROID) 137 MCG tablet TAKE 1 TABLET (137 MCG TOTAL) BY MOUTH DAILY BEFORE BREAKFAST.   losartan (COZAAR) 50 MG tablet TAKE 1 TABLET (50 MG TOTAL) BY MOUTH DAILY.   metFORMIN (GLUCOPHAGE-XR) 500 MG 24 hr tablet TAKE 4 TABLETS BY MOUTH DAILY WITH BREAKFAST   metoprolol tartrate (LOPRESSOR) 100 MG tablet Take 1 tablet (100 mg total) by mouth 2 (two) times daily.   Multiple Vitamins-Minerals (MULTIVITAMIN WITH MINERALS) tablet Take 1 tablet by mouth daily. Woman's Gummies   neomycin-polymyxin b-dexamethasone (MAXITROL) 3.5-10000-0.1 OINT Apply 1 a small amount into affected eye at bedtime   neomycin-polymyxin b-dexamethasone (MAXITROL) 3.5-10000-0.1 SUSP Apply 1 drop into affected eye three times a day   nitroGLYCERIN (NITROSTAT) 0.4 MG SL tablet Place 1 tablet (0.4 mg total) under the tongue every 5 (five) minutes as needed for chest pain.   pantoprazole (PROTONIX) 40 MG tablet TAKE 1 TABLET BY MOUTH DAILY.   PARoxetine (PAXIL) 10 MG tablet Take 1 tablet (10 mg total) by mouth  daily.   pregabalin (LYRICA) 75 MG capsule Take 1 capsule (75 mg total) by mouth daily.   rosuvastatin (CRESTOR) 20 MG tablet TAKE 1 TABLET BY MOUTH DAILY.   Semaglutide, 2 MG/DOSE, (OZEMPIC, 2 MG/DOSE,) 8 MG/3ML SOPN Inject 2 mg into the skin once a week.     Allergies:   Ace inhibitors, Penicillins, Clindamycin/lincomycin, and Ciprofloxacin   Social History   Socioeconomic History   Marital status: Single    Spouse name: Not on file   Number of children: Not on file   Years of education: Not on file   Highest education level: Not on file  Occupational History    Employer: [employer]    Comment: Outpatient scheduling in radiology  Tobacco Use   Smoking status: Former    Packs/day: 0.50    Types: Cigarettes    Quit date: 09/29/2007    Years since quitting: 13.7   Smokeless tobacco: Never   Tobacco comments:    Smoked on and off for 20 years. Quit about 8 years ago (as of 08/2015)  Vaping Use   Vaping Use: Never used  Substance and Sexual Activity   Alcohol use: Not Currently    Alcohol/week: 0.0 standard drinks of alcohol   Drug use: No   Sexual activity: Not Currently  Other Topics Concern   Not on file  Social History Narrative   The patient is divorced, moved to [place] in 2006 from [place].  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  No alcohol.  Tobacco use:  She quit 5 years ago.  She smoked onand off for approximately 20 years.  No history of recreational drugUse. Drinks two cups of coffee per work day.    Social Determinants of Health   Financial Resource Strain: Not on file  Food Insecurity: Not on file  Transportation Needs: Not on file  Physical Activity: Not on file  Stress: Not on file  Social Connections: Not on file     Family History: The patient's family history includes Alcoholism in her father; Bipolar disorder in her father; CAD (age of onset: 63) in her  mother; CAD (age of onset: 44) in her maternal grandfather; COPD in her father; Cancer in an other family member; Dementia in her father; Depression in her father; Emphysema in her father; Fibroids in an  other family member; Luiz Blare' disease in her mother; Heart attack (age of onset: 6) in her mother; Heart attack (age of onset: 54) in her sister; Heart attack (age of onset: 73) in her maternal grandfather; Heart disease in her father, maternal grandfather, mother, and sister; Heart disease (age of onset: 45) in her brother; Hypertension in her father, mother, and another family member; Kidney cancer in her cousin; Lung cancer in an other family member; Other (age of onset: 63) in her sister; Prostate cancer in her brother; Thyroid disease in her mother; Thyroid nodules (age of onset: 51) in her brother. There is no history of Colon cancer or Colon polyps.  ROS:   Please see the history of present illness.     All other systems reviewed and are negative.  EKGs/Labs/Other Studies Reviewed:    The following studies were reviewed today:   Cardiac cath 06/2019 Conclusions: Significant single-vessel coronary artery disease with calcified stenosis of up to 80% involving mid portion of small RCA.  There is mild, non-obstructive disease involving the LAD and LCx. Mildly elevated left ventricular filling pressure.   Recommendations: Optimize medical therapy, including increasing metoprolol tartrate to 50 mg BID. Continue aggressive lipid and blood sugar control to prevent progression of disease. If the patient has refractory symptoms despite maximal tolerated doses of at least 2 antianginal agents, PCI to the RCA could be considered.   Yvonne Kendall, MD [facility]  Echo 05/2019   1. Left ventricular ejection fraction, by estimation, is 60 to 65%. The  left ventricle has normal function. The left ventricle has no regional  wall motion abnormalities. Left ventricular diastolic parameters were   normal.   2. Right ventricular systolic function is normal. The right ventricular  size is normal.   3. The mitral valve is grossly normal. No evidence of mitral valve  regurgitation.   4. The aortic valve is tricuspid. Aortic valve regurgitation is trivial.  No aortic stenosis is present.   5. The inferior vena cava is normal in size with greater than 50%  respiratory variability, suggesting right atrial pressure of 3 mmHg.   EKG:  EKG is  ordered today.  The ekg ordered today demonstrates NSR 90bpm, nonspecific T wave changes  Recent Labs: 05/19/2021: ALT 15; BUN 14; Creatinine 0.65; Hemoglobin 13.1; Platelet Count 250; Potassium 4.2; Sodium 139 06/07/2021: TSH 0.04  Recent Lipid Panel    Component Value Date/Time   CHOL 117 02/04/2021 1340   CHOL 122 03/21/2017 1018   TRIG 136 02/04/2021 1340   HDL 51 02/04/2021 1340   HDL 41 03/21/2017 1018   CHOLHDL 2.3 02/04/2021 1340   VLDL 23.0 02/03/2020 0854   LDLCALC 44 02/04/2021 1340   LDLDIRECT 153.0 09/01/2014 1634    Physical Exam:    VS:  BP 118/90 (BP Location: Left Arm, Patient Position: Sitting, Cuff Size: Large)   Pulse 90   Ht 5\' 4"  (1.626 m)   Wt 205 lb (93 kg)   LMP 06/24/2010 (Approximate)   BMI 35.19 kg/m     Wt Readings from Last 3 Encounters:  07/08/21 205 lb (93 kg)  06/07/21 207 lb 6.4 oz (94.1 kg)  05/19/21 207 lb 3.2 oz (94 kg)     GEN:  Well nourished, well developed in no acute distress HEENT: Normal NECK: No JVD; No carotid bruits LYMPHATICS: No lymphadenopathy CARDIAC: RRR, no murmurs, rubs, gallops RESPIRATORY:  Clear to auscultation without rales, wheezing or rhonchi  ABDOMEN: Soft, non-tender, non-distended MUSCULOSKELETAL:  No  edema; No deformity  SKIN: Warm and dry NEUROLOGIC:  Alert and oriented x 3 PSYCHIATRIC:  Normal affect   ASSESSMENT:    1. Coronary artery disease of native artery of native heart with stable angina pectoris (HCC)   2. Palpitations   3. Essential hypertension    4. Hyperlipidemia, mixed   5. Morbid obesity (HCC)   6. Type 2 diabetes mellitus with hyperglycemia, without long-term current use of insulin (HCC)    PLAN:    In order of problems listed above:  CAD Cath in 2021 showed single vessel CAD 80% stenosis in the RCA with otherwise nonobstructive CAD. Medical mangement was recommended unless she has refractory angina. She denies anginal symptoms. She continues to make healthy lifestyle changes. Continue Aspirin, metoprolol, and Crestor. No further ischemic work-up indicated.   Palpitations Prior heart monitor showed pSVT. She has rare palpitations/fluttering. She is on metoprolol 100mg  BID.   HTN BP is good today. Continue Metoprolol and Losartan.   HLD LDL 44 01/2021. Continue Crestor 20mg  daily.   Obesity She has lost about 15lbs on Ozempic. Further lifestyle changes discussed.   DM2 Most recent A1C 7.1. This is followed by PCP.  Disposition: Follow up in 6 month(s) with MD/APP      Signed, Angelle Isais David Stall, PA-C  07/08/2021 8:18 AM    [facility]

## 2021-07-06 NOTE — Telephone Encounter (Signed)
Requested medication (s) are due for refill today: yes  Requested medication (s) are on the active medication list: yes  Last refill:  07/01/20-07/14/21 #90 3 refills  Future visit scheduled: no   Notes to clinic:  last ordered by Bebe Shaggy, FNP. Do you want to refill Rx?     Requested Prescriptions  Pending Prescriptions Disp Refills   rosuvastatin (CRESTOR) 20 MG tablet 90 tablet 3    Sig: TAKE 1 TABLET BY MOUTH DAILY.     Cardiovascular:  Antilipid - Statins 2 Failed - 07/05/2021  5:39 PM      Failed - Lipid Panel in normal range within the last 12 months    Cholesterol, Total  Date Value Ref Range Status  03/21/2017 122 100 - 199 mg/dL Final   Cholesterol  Date Value Ref Range Status  02/04/2021 117 <200 mg/dL Final   LDL Cholesterol (Calc)  Date Value Ref Range Status  02/04/2021 44 mg/dL (calc) Final    Comment:    Reference range: <100 . Desirable range <100 mg/dL for primary prevention;   <70 mg/dL for patients with CHD or diabetic patients  with > or = 2 CHD risk factors. Marland Kitchen LDL-C is now calculated using the Martin-Hopkins  calculation, which is a validated novel method providing  better accuracy than the Friedewald equation in the  estimation of LDL-C.  Cresenciano Genre et al. Annamaria Helling. 7408;144(81): 2061-2068  (http://education.[employer].com/faq/FAQ164)    Direct LDL  Date Value Ref Range Status  09/01/2014 153.0 mg/dL Final    Comment:    Optimal:  <100 mg/dLNear or Above Optimal:  100-129 mg/dLBorderline High:  130-159 mg/dLHigh:  160-189 mg/dLVery High:  >190 mg/dL   HDL  Date Value Ref Range Status  02/04/2021 51 > OR = 50 mg/dL Final  03/21/2017 41 >39 mg/dL Final   Triglycerides  Date Value Ref Range Status  02/04/2021 136 <150 mg/dL Final         Passed - Cr in normal range and within 360 days    Creatinine  Date Value Ref Range Status  05/19/2021 0.65 0.44 - 1.00 mg/dL Final  02/18/2013 0.8 0.6 - 1.1 mg/dL Final   Creat  Date Value  Ref Range Status  02/04/2021 0.69 0.50 - 1.03 mg/dL Final   Creatinine,U  Date Value Ref Range Status  06/07/2021 51.9 mg/dL Final         Passed - Patient is not pregnant      Passed - Valid encounter within last 12 months    Recent Outpatient Visits           5 months ago Encounter for general adult medical examination with abnormal findings   [facility] [place], Coralie Keens, NP       Future Appointments             In 2 days Furth, Cadence H, PA-C [place], [facility]

## 2021-07-08 ENCOUNTER — Ambulatory Visit (INDEPENDENT_AMBULATORY_CARE_PROVIDER_SITE_OTHER): Payer: No Typology Code available for payment source | Admitting: Medical

## 2021-07-08 ENCOUNTER — Other Ambulatory Visit: Payer: Self-pay

## 2021-07-08 ENCOUNTER — Encounter: Payer: Self-pay | Admitting: Medical

## 2021-07-08 VITALS — BP 118/90 | HR 90 | Ht 64.0 in | Wt 205.0 lb

## 2021-07-08 DIAGNOSIS — E1165 Type 2 diabetes mellitus with hyperglycemia: Secondary | ICD-10-CM

## 2021-07-08 DIAGNOSIS — I25118 Atherosclerotic heart disease of native coronary artery with other forms of angina pectoris: Secondary | ICD-10-CM | POA: Diagnosis not present

## 2021-07-08 DIAGNOSIS — E782 Mixed hyperlipidemia: Secondary | ICD-10-CM | POA: Diagnosis not present

## 2021-07-08 DIAGNOSIS — R002 Palpitations: Secondary | ICD-10-CM | POA: Diagnosis not present

## 2021-07-08 DIAGNOSIS — I10 Essential (primary) hypertension: Secondary | ICD-10-CM

## 2021-07-08 NOTE — Patient Instructions (Signed)
Medication Instructions:  Your physician recommends that you continue on your current medications as directed. Please refer to the Current Medication list given to you today.  *If you need a refill on your cardiac medications before your next appointment, please call your pharmacy*   Lab Work: None ordered If you have labs (blood work) drawn today and your tests are completely normal, you will receive your results only by: MyChart Message (if you have MyChart) OR A paper copy in the mail If you have any lab test that is abnormal or we need to change your treatment, we will call you to review the results.   Testing/Procedures: None ordered   Follow-Up: At [facility], you and your health needs are our priority.  As part of our continuing mission to provide you with exceptional heart care, we have created designated Provider Care Teams.  These Care Teams include your primary Cardiologist (physician) and Advanced Practice Providers (APPs -  Physician Assistants and Nurse Practitioners) who all work together to provide you with the care you need, when you need it.  We recommend signing up for the patient portal called "MyChart".  Sign up information is provided on this After Visit Summary.  MyChart is used to connect with patients for Virtual Visits (Telemedicine).  Patients are able to view lab/test results, encounter notes, upcoming appointments, etc.  Non-urgent messages can be sent to your provider as well.   To learn more about what you can do with MyChart, go to [email or web address].    Your next appointment:   6 month(s)  The format for your next appointment:   In Person  Provider:   You may see Christopher End, MD or one of the following Advanced Practice Providers on your designated Care Team:   Christopher Berge, NP Ryan Dunn, PA-C Cadence Furth, PA-C   Other Instructions N/A  Important Information About Sugar       

## 2021-07-19 ENCOUNTER — Other Ambulatory Visit: Payer: Self-pay

## 2021-07-19 MED FILL — Pantoprazole Sodium EC Tab 40 MG (Base Equiv): ORAL | 90 days supply | Qty: 90 | Fill #1 | Status: AC

## 2021-07-30 ENCOUNTER — Other Ambulatory Visit: Payer: Self-pay | Admitting: Internal Medicine

## 2021-07-31 ENCOUNTER — Other Ambulatory Visit: Payer: Self-pay | Admitting: Internal Medicine

## 2021-07-31 ENCOUNTER — Other Ambulatory Visit: Payer: Self-pay

## 2021-08-01 ENCOUNTER — Other Ambulatory Visit: Payer: Self-pay

## 2021-08-01 MED FILL — Losartan Potassium Tab 50 MG: ORAL | 90 days supply | Qty: 90 | Fill #0 | Status: AC

## 2021-08-01 NOTE — Telephone Encounter (Signed)
Requested Prescriptions  Pending Prescriptions Disp Refills  . losartan (COZAAR) 50 MG tablet 30 tablet 2    Sig: TAKE 1 TABLET (50 MG TOTAL) BY MOUTH DAILY.     Cardiovascular:  Angiotensin Receptor Blockers Failed - 07/31/2021  7:31 PM      Failed - Last BP in normal range    BP Readings from Last 1 Encounters:  07/08/21 118/90         Passed - Cr in normal range and within 180 days    Creatinine  Date Value Ref Range Status  05/19/2021 0.65 0.44 - 1.00 mg/dL Final  02/18/2013 0.8 0.6 - 1.1 mg/dL Final   Creat  Date Value Ref Range Status  02/04/2021 0.69 0.50 - 1.03 mg/dL Final   Creatinine,U  Date Value Ref Range Status  06/07/2021 51.9 mg/dL Final         Passed - K in normal range and within 180 days    Potassium  Date Value Ref Range Status  05/19/2021 4.2 3.5 - 5.1 mmol/L Final  02/18/2013 4.4 3.5 - 5.1 mEq/L Final         Passed - Patient is not pregnant      Passed - Valid encounter within last 6 months    Recent Outpatient Visits          5 months ago Encounter for general adult medical examination with abnormal findings   [facility] [place], Coralie Keens, NP      Future Appointments            In 5 months End, Harrell Gave, MD [facility], [facility]

## 2021-08-02 ENCOUNTER — Other Ambulatory Visit: Payer: Self-pay

## 2021-08-11 ENCOUNTER — Encounter: Payer: Self-pay | Admitting: Endocrinology

## 2021-08-11 ENCOUNTER — Ambulatory Visit (INDEPENDENT_AMBULATORY_CARE_PROVIDER_SITE_OTHER): Payer: No Typology Code available for payment source | Admitting: Endocrinology

## 2021-08-11 VITALS — BP 118/76 | HR 90 | Ht 64.0 in | Wt 208.8 lb

## 2021-08-11 DIAGNOSIS — Z794 Long term (current) use of insulin: Secondary | ICD-10-CM | POA: Diagnosis not present

## 2021-08-11 DIAGNOSIS — E1165 Type 2 diabetes mellitus with hyperglycemia: Secondary | ICD-10-CM

## 2021-08-11 DIAGNOSIS — C73 Malignant neoplasm of thyroid gland: Secondary | ICD-10-CM | POA: Diagnosis not present

## 2021-08-11 DIAGNOSIS — E890 Postprocedural hypothyroidism: Secondary | ICD-10-CM

## 2021-08-11 NOTE — Progress Notes (Signed)
Patient ID: Stacey Roberts, female   DOB: 1965/11/04, 56 y.o.   MRN: [id number]            Reason for Appointment: Type II Diabetes follow-up   History of Present Illness   Diagnosis date: 2008  Previous history: She has been on insulin since 2017 Previous A1c range = 7.3-10.4 She was started on Ozempic in 2021 and this has been increased periodically and her last dose of 2 mg was started in 2/23  Recent history:     Non-insulin hypoglycemic drugs: Ozempic 74m weekly, metformin 2 g, Farxiga 5 mg daily     Insulin regimen: 40-45 units bioequivalent glargine in the morning daily           Side effects from medications: Mild nausea from Ozempic, nausea from Trulicity  Current self management, blood sugar patterns and problems identified:  Has been able to start FIranwithout any side effects She previously was taking variable doses of basal insulin based on daily fasting blood sugar with a range of doses of 35-50 units and now is taking mostly 40 or 45  She is able to use a continuous glucose monitor using the libre 3, also having no difficulties with using this consistently Able to have a better understanding of her blood sugar patterns She does have a significant dawn phenomenon, does not currently take her basal insulin until late morning at breakfast Generally postprandial readings are fairly good except on occasion at breakfast or lunch Weight is about the same No recent hypoglycemia overnight  Exercise:  may walk a little  Diet management: She is eating very small portions but not always appearing to be getting protein and sometimes may just eat an English muffin or an apple for breakfast or snack Otherwise she  get some protein      Interpretation of her freestyle libre version 3 download for the last 2 weeks as follows  Blood sugars are overall relatively stable with only mild variability HYPERGLYCEMIA is occurring primarily in the morning hours before noon She has a  significant burning dawn phenomenon with blood sugars overall gradually rising after 3 AM Blood sugars continue to peak till about 11 AM or so POSTPRANDIAL hyperglycemia is occurring occasionally late morning to a variable extent However overall rise in blood sugar after meals is variable and averages are discussed below Blood sugars are generally fairly well controlled after dinner No hypoglycemia seen with only occasional low normal readings around 1 AM  CGM use % of time 95  2-week average/GV 153/21  Time in range      81  %  % Time Above 180 18+1  % Time above 250   % Time Below 70 0     PRE-MEAL Fasting Lunch Dinner 12 AM-2 AM Overall  Glucose range:       Averages: 171   128 153   POST-MEAL PC Breakfast PC Lunch PC Dinner  Glucose range:     Averages: 194 169 165    Previously:  PRE-MEAL Fasting Lunch Dinner Bedtime Overall  Glucose range: 120-205      Mean/median:        POST-MEAL PC Breakfast PC Lunch PC Dinner  Glucose range:   160-180  Mean/median:        Hypoglycemia:  none                        Dietician visit: Most recent: 2011 with classes  Weight control:  Wt Readings from Last 3 Encounters:  08/11/21 208 lb 12.8 oz (94.7 kg)  07/08/21 205 lb (93 kg)  06/07/21 207 lb 6.4 oz (94.1 kg)             Diabetes labs:  Lab Results  Component Value Date   HGBA1C 7.1 (A) 06/07/2021   HGBA1C 7.6 (A) 03/10/2021   HGBA1C 8.1 (A) 12/08/2020   Lab Results  Component Value Date   MICROALBUR 0.9 06/07/2021   LDLCALC 44 02/04/2021   CREATININE 0.59 08/11/2021     Allergies as of 08/11/2021       Reactions   Ace Inhibitors Cough   REACTION: dry cough   Penicillins Shortness Of Breath, Rash   Pt has tolerated Cephalexin in 2017 and again in 2018.  Has patient had a PCN reaction causing immediate rash, facial/tongue/throat swelling, SOB or lightheadedness with hypotension: Yes Has patient had a PCN reaction causing severe rash involving mucus  membranes or skin necrosis: Yes Has patient had a PCN reaction that required hospitalization No Has patient had a PCN reaction occurring within the last 10 years: No If all of the above answers are "NO", then may proceed with Cephalosporin use.   Clindamycin/lincomycin Other (See Comments)   Severe stomach cramps   Ciprofloxacin Itching        Medication List        Accurate as of August 11, 2021 11:59 PM. If you have any questions, ask your nurse or doctor.          aspirin EC 81 MG tablet Take 1 tablet (81 mg total) by mouth daily.   Farxiga 5 MG Tabs tablet Generic drug: dapagliflozin propanediol TAKE 1 TABLET BY MOUTH DAILY.   freestyle lancets 1 each by Other route 2 (two) times daily. [name]   FreeStyle Libre 3 Sensor Misc Apply 1 sensor on upper arm every 14 days for continuous glucose monitoring (1 Device by Does not apply route every 14 (fourteen) days. Apply 1 sensor on upper arm every 14 days for continuous glucose monitoring)   FreeStyle Lite Devi 1 each by Does not apply route 2 (two) times daily. [name]   FREESTYLE LITE test strip Generic drug: glucose blood Use 2 (two) times daily. (dx [name])   gabapentin 300 MG capsule Commonly known as: NEURONTIN Take 600 mg by mouth at bedtime as needed.   levothyroxine 112 MCG tablet Commonly known as: SYNTHROID Take 1 tablet (112 mcg total) by mouth daily. What changed: Another medication with the same name was removed. Continue taking this medication, and follow the directions you see here. Changed by: Elayne Snare, MD   losartan 50 MG tablet Commonly known as: COZAAR TAKE 1 TABLET (50 MG TOTAL) BY MOUTH DAILY.   metFORMIN 500 MG 24 hr tablet Commonly known as: GLUCOPHAGE-XR TAKE 4 TABLETS BY MOUTH DAILY WITH BREAKFAST   metoprolol tartrate 100 MG tablet Commonly known as: LOPRESSOR Take 1 tablet (100 mg total) by mouth 2 (two) times daily.   multivitamin with minerals tablet Take 1 tablet by mouth  daily. Woman's Gummies   neomycin-polymyxin b-dexamethasone 3.5-10000-0.1 Oint Commonly known as: MAXITROL Apply a small amount into affected eye(s) at bedtime (Apply 1 a small amount into affected eye at bedtime)   neomycin-polymyxin b-dexamethasone 3.5-10000-0.1 Susp Commonly known as: MAXITROL Instill 1 drop into affected eye(s) three times a day (Apply 1 drop into affected eye three times a day)   nitroGLYCERIN 0.4 MG SL tablet Commonly known as: Nitrostat Place 1  tablet (0.4 mg total) under the tongue every 5 (five) minutes as needed for chest pain.   Ozempic (2 MG/DOSE) 8 MG/3ML Sopn Generic drug: Semaglutide (2 MG/DOSE) Inject 2 mg into the skin once a week.   pantoprazole 40 MG tablet Commonly known as: PROTONIX TAKE 1 TABLET BY MOUTH DAILY.   PARoxetine 10 MG tablet Commonly known as: Paxil Take 1 tablet (10 mg total) by mouth daily.   pregabalin 75 MG capsule Commonly known as: LYRICA Take 1 capsule (75 mg total) by mouth daily.   rosuvastatin 20 MG tablet Commonly known as: CRESTOR TAKE 1 TABLET BY MOUTH DAILY.   Semglee (yfgn) 100 UNIT/ML Pen Generic drug: insulin glargine-yfgn Inject 45 Units into the skin every morning.   Unifine Pentips 31G X 8 MM Misc Generic drug: Insulin Pen Needle use to inject twice a day        Allergies:  Allergies  Allergen Reactions   Ace Inhibitors Cough    REACTION: dry cough   Penicillins Shortness Of Breath and Rash    Pt has tolerated Cephalexin in 2017 and again in 2018.   Has patient had a PCN reaction causing immediate rash, facial/tongue/throat swelling, SOB or lightheadedness with hypotension: Yes Has patient had a PCN reaction causing severe rash involving mucus membranes or skin necrosis: Yes Has patient had a PCN reaction that required hospitalization No Has patient had a PCN reaction occurring within the last 10 years: No If all of the above answers are "NO", then may proceed with Cephalosporin use.     Clindamycin/Lincomycin Other (See Comments)    Severe stomach cramps   Ciprofloxacin Itching    Past Medical History:  Diagnosis Date   Allergy    seasonal   BRCA negative 2017   Breast cancer ([place]) 2014   Breast cancer ([place]) 2017   Bronchitis    hx of   Coronary artery disease 06/2019   80% mid RCA stenosis - medical management   Diabetes mellitus, type II ([place])    Endometrial hyperplasia 2014   Mirena placed; Dr. 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 Buddy Duty    Past Surgical History:  Procedure Laterality Date   APPLICATION OF A-CELL OF EXTREMITY Left 09/29/2020   Procedure: placement of ACell;  Surgeon: Wallace Going, DO;  Location: [facility];  Service: Plastics;  Laterality: Left;   BREAST BIOPSY  2000   s/p   BREAST BIOPSY Left 01/22/2013   Procedure: RE-EXCISION LEFT BREAST DCIS;  Surgeon: Harl Bowie, MD;  Location: [facility] [place];  Service: General;  Laterality: Left;   BREAST LUMPECTOMY Left 01/06/2013   Procedure: LUMPECTOMY;  Surgeon: Harl Bowie, MD;  Location: [facility];  Service: General;  Laterality: Left;   BREAST LUMPECTOMY Left 2017  BREAST LUMPECTOMY WITH NEEDLE LOCALIZATION AND AXILLARY SENTINEL LYMPH NODE BX Left 09/30/2015   Procedure: Left BREAST LUMPECTOMY WITH NEEDLE LOCALIZATION AND AXILLARY SENTINEL LYMPH NODE BX;  Surgeon: Coralie Keens, MD;   Location: [facility];  Service: General;  Laterality: Left;   BREAST RECONSTRUCTION Bilateral 10/12/2015   Procedure: BREAST oncoplastic RECONSTRUCTION;  Surgeon: Irene Limbo, MD;  Location: [facility];  Service: Plastics;  Laterality: Bilateral;   BREAST REDUCTION SURGERY Bilateral 10/12/2015   Procedure: MAMMARY REDUCTION  (BREAST);  Surgeon: Irene Limbo, MD;  Location: [facility];  Service: Plastics;  Laterality: Bilateral;   CORONARY ANGIOPLASTY     DILATION AND CURETTAGE OF UTERUS  2004   s/p   EXCISION OF BREAST LESION Left 10/12/2015   Procedure: Re-EXCISION OF BREAST;  Surgeon: Coralie Keens, MD;  Location: [facility];  Service: General;  Laterality: Left;   INCISION AND DRAINAGE OF WOUND Left 09/29/2020   Procedure: Excision of back and left breast wound;  Surgeon: Wallace Going, DO;  Location: [facility];  Service: Plastics;  Laterality: Left;  1 hour   LATISSIMUS FLAP TO BREAST Left 07/19/2020   Procedure: Left breast radiation necrosis excision with latissimus muscle flap;  Surgeon: Wallace Going, DO;  Location: [place];  Service: Plastics;  Laterality: Left;  3 hours   LEFT HEART CATH AND CORONARY ANGIOGRAPHY N/A 06/27/2019   Procedure: LEFT HEART CATH AND CORONARY ANGIOGRAPHY;  Surgeon: Nelva Bush, MD;  Location: [facility];  Service: Cardiovascular;  Laterality: N/A;   REDUCTION MAMMAPLASTY Bilateral 09/2015   THYROIDECTOMY  02/2009   Dr Harlow Asa   TONSILLECTOMY  1982   TONSILLECTOMY      Family History  Problem Relation Age of Onset   CAD Mother 21       Died age 35 of CAD   Heart disease Mother        CABG x 2   Graves' disease Mother    Hypertension Mother    Thyroid disease Mother    Heart attack Mother 63   Dementia Father        deceased age 74 secondary to dementia   Bipolar disorder Father    Emphysema Father    Heart disease Father    COPD Father        smoker    Hypertension Father    Depression Father    Alcoholism Father    Other Sister 70       hysterectomy for fibroids   Heart disease Sister        CABG x 2   Heart attack Sister 83   Prostate cancer Brother        low grade; w/ surveillance   Thyroid nodules Brother 55   Heart disease Brother 57       CABG x 4   CAD Maternal Grandfather 7   Heart disease Maternal Grandfather    Heart attack Maternal Grandfather 84   Kidney cancer Cousin        maternal 1st cousin dx 7-47; former smoker   Fibroids Other        niece dx approx 54   Hypertension Other    Lung cancer Other        maternal great aunt (MGM's sister); not a smoker   Cancer Other        nephew dx neuroblastoma at 64.22 years old   Colon cancer Neg Hx    Colon polyps  Neg Hx     Social History:  reports that she quit smoking about 13 years ago. Her smoking use included cigarettes. She smoked an average of .5 packs per day. She has never used smokeless tobacco. She reports that she does not currently use alcohol. She reports that she does not use drugs.  Review of Systems:  THYROID cancer: This was diagnosed in 2011 and she had a 1.7 cm follicular variant of papillary thyroid carcinoma confined to the thyroid but showing capsular invasion and angiolymphatic invasion  Treated with total thyroidectomy in 2011 and followed with 103 mCi of I-131 Whole-body scan in 2012 was negative She did have a single enlarged right cervical lymph node that was stable in 2012 and 2013  THYROGLOBULIN levels have been consistently undetectable  She has been since 5/23 on LEVOTHYROXINE 112 mcg daily which she takes regularly in the morning , Previously TSH low  Lab Results  Component Value Date   TSH 0.71 08/11/2021   TSH 0.04 (L) 06/07/2021   TSH 0.41 02/03/2020   FREET4 1.13 08/11/2021   FREET4 1.55 06/07/2021   FREET4 1.36 02/03/2020   Hypertension: She has been treated with losartan 50 mg with recent blood pressure readings as  follows, now also on Farxiga  BP Readings from Last 3 Encounters:  08/11/21 118/76  07/08/21 118/90  06/24/21 (!) 146/97    Lipids: She has been treated with CRESTOR 20 mg daily, also has known CAD No side effects listed as muscle aches LDL well controlled    Lab Results  Component Value Date   CHOL 117 02/04/2021   CHOL 121 02/03/2020   CHOL 104 03/21/2019   Lab Results  Component Value Date   HDL 51 02/04/2021   HDL 51.80 02/03/2020   HDL 35.50 (L) 03/21/2019   Lab Results  Component Value Date   LDLCALC 44 02/04/2021   LDLCALC 46 02/03/2020   LDLCALC 34 03/21/2019   Lab Results  Component Value Date   TRIG 136 02/04/2021   TRIG 115.0 02/03/2020   TRIG 172.0 (H) 03/21/2019   Lab Results  Component Value Date   CHOLHDL 2.3 02/04/2021   CHOLHDL 2 02/03/2020   CHOLHDL 3 03/21/2019   Lab Results  Component Value Date   LDLDIRECT 153.0 09/01/2014   LDLDIRECT 157.0 02/03/2014   LDLDIRECT 141.7 11/28/2012   She is up-to-date with eye exams    Examination:   BP 118/76 (BP Location: Left Arm, Patient Position: Sitting, Cuff Size: Normal)   Pulse 90   Ht '5\' 4"'  (1.626 m)   Wt 208 lb 12.8 oz (94.7 kg)   LMP 06/24/2010 (Approximate)   SpO2 97%   BMI 35.84 kg/m   Body mass index is 35.84 kg/m.    ASSESSMENT/ PLAN:    Diabetes type 2 on basal insulin, Ozempic 2 mg, Farxiga 5 mg and metformin:   Blood glucose control is fairly good with recent GMI on sensor 7%  She is able to use a continuous glucose monitor with better understanding of her blood sugar patterns She does have a significant dawn phenomenon, does not currently take her basal insulin until late morning at breakfast Generally postprandial readings are fairly good except on occasion at breakfast or lunch Weight is about the same  Recommendations: She will need to take her GLARGINE insulin as soon as she wakes up as this may help the dawn phenomenon She will look at what foods make her blood  sugars spike up especially at breakfast  and make sure she is having some protein and also likely less carbohydrates overall Regular exercise We will also need follow-up renal function today Consider increasing Farxiga to 10 mg on the next visit She will need to take only a stable dose of 45 units of Lantus for now and adjust it only if waking up sugars are out of range for 3 days in a row Discussed blood sugar targets both fasting and after meals  For hypothyroidism she will have TSH checked again today since she had a dosage adjustment previously  HYPERTENSION: Blood pressure appears to be better with adding Wilder Glade, continue to monitor blood pressure regularly at home also   There are no Patient Instructions on file for this visit.   Elayne Snare 08/14/2021, 11:04 AM     Elayne Snare

## 2021-08-12 LAB — TSH: TSH: 0.71 u[IU]/mL (ref 0.35–5.50)

## 2021-08-12 LAB — T4, FREE: Free T4: 1.13 ng/dL (ref 0.60–1.60)

## 2021-08-12 LAB — BASIC METABOLIC PANEL
BUN: 14 mg/dL (ref 6–23)
CO2: 28 mEq/L (ref 19–32)
Calcium: 9.6 mg/dL (ref 8.4–10.5)
Chloride: 101 mEq/L (ref 96–112)
Creatinine, Ser: 0.59 mg/dL (ref 0.40–1.20)
GFR: 100.66 mL/min (ref >60.00)
Glucose, Bld: 104 mg/dL — ABNORMAL HIGH (ref 70–99)
Potassium: 4.3 mEq/L (ref 3.5–5.1)
Sodium: 140 mEq/L (ref 135–145)

## 2021-08-17 ENCOUNTER — Encounter: Payer: Self-pay | Admitting: Endocrinology

## 2021-08-26 ENCOUNTER — Encounter: Payer: Self-pay | Admitting: Endocrinology

## 2021-08-26 ENCOUNTER — Other Ambulatory Visit: Payer: Self-pay

## 2021-08-26 ENCOUNTER — Other Ambulatory Visit: Payer: Self-pay | Admitting: Endocrinology

## 2021-08-26 ENCOUNTER — Other Ambulatory Visit: Payer: Self-pay | Admitting: Internal Medicine

## 2021-08-26 DIAGNOSIS — E1165 Type 2 diabetes mellitus with hyperglycemia: Secondary | ICD-10-CM

## 2021-08-26 MED ORDER — METFORMIN HCL ER 500 MG PO TB24
ORAL_TABLET | ORAL | 3 refills | Status: DC
  Filled 2021-08-26: qty 360, 90d supply, fill #0
  Filled 2021-11-28: qty 360, 90d supply, fill #1
  Filled 2022-02-27: qty 360, 90d supply, fill #2
  Filled 2022-05-24: qty 360, 90d supply, fill #3

## 2021-08-26 MED FILL — Dapagliflozin Propanediol Tab 5 MG (Base Equivalent): ORAL | 90 days supply | Qty: 90 | Fill #0 | Status: CN

## 2021-08-26 MED FILL — Continuous Glucose System Sensor: 28 days supply | Qty: 2 | Fill #0 | Status: AC

## 2021-08-26 MED FILL — Dapagliflozin Propanediol Tab 5 MG (Base Equivalent): ORAL | 90 days supply | Qty: 90 | Fill #0 | Status: AC

## 2021-08-31 ENCOUNTER — Other Ambulatory Visit: Payer: Self-pay

## 2021-08-31 ENCOUNTER — Encounter (INDEPENDENT_AMBULATORY_CARE_PROVIDER_SITE_OTHER): Payer: Self-pay

## 2021-08-31 ENCOUNTER — Encounter: Payer: Self-pay | Admitting: Pharmacist

## 2021-09-02 ENCOUNTER — Other Ambulatory Visit: Payer: Self-pay

## 2021-09-26 ENCOUNTER — Other Ambulatory Visit: Payer: Self-pay

## 2021-09-26 MED FILL — Continuous Glucose System Sensor: 28 days supply | Qty: 2 | Fill #1 | Status: AC

## 2021-09-27 ENCOUNTER — Other Ambulatory Visit: Payer: Self-pay

## 2021-10-12 ENCOUNTER — Ambulatory Visit: Payer: No Typology Code available for payment source | Admitting: Endocrinology

## 2021-10-17 ENCOUNTER — Other Ambulatory Visit: Payer: Self-pay | Admitting: Internal Medicine

## 2021-10-17 MED FILL — Pantoprazole Sodium EC Tab 40 MG (Base Equiv): ORAL | 90 days supply | Qty: 90 | Fill #2 | Status: AC

## 2021-10-18 ENCOUNTER — Other Ambulatory Visit: Payer: Self-pay

## 2021-10-18 MED ORDER — ROSUVASTATIN CALCIUM 20 MG PO TABS
20.0000 mg | ORAL_TABLET | Freq: Every day | ORAL | 1 refills | Status: DC
  Filled 2021-10-18: qty 90, 90d supply, fill #0
  Filled 2022-02-06: qty 90, 90d supply, fill #1

## 2021-10-18 NOTE — Telephone Encounter (Signed)
Requested Prescriptions  Pending Prescriptions Disp Refills  . rosuvastatin (CRESTOR) 20 MG tablet 90 tablet 0    Sig: TAKE 1 TABLET BY MOUTH DAILY.     Cardiovascular:  Antilipid - Statins 2 Failed - 10/17/2021  6:42 PM      Failed - Lipid Panel in normal range within the last 12 months    Cholesterol, Total  Date Value Ref Range Status  03/21/2017 122 100 - 199 mg/dL Final   Cholesterol  Date Value Ref Range Status  02/04/2021 117 <200 mg/dL Final   LDL Cholesterol (Calc)  Date Value Ref Range Status  02/04/2021 44 mg/dL (calc) Final    Comment:    Reference range: <100 . Desirable range <100 mg/dL for primary prevention;   <70 mg/dL for patients with CHD or diabetic patients  with > or = 2 CHD risk factors. Marland Kitchen LDL-C is now calculated using the Martin-Hopkins  calculation, which is a validated novel method providing  better accuracy than the Friedewald equation in the  estimation of LDL-C.  Cresenciano Genre et al. Annamaria Helling. 8786;767(20): 2061-2068  (http://education.[employer].com/faq/FAQ164)    Direct LDL  Date Value Ref Range Status  09/01/2014 153.0 mg/dL Final    Comment:    Optimal:  <100 mg/dLNear or Above Optimal:  100-129 mg/dLBorderline High:  130-159 mg/dLHigh:  160-189 mg/dLVery High:  >190 mg/dL   HDL  Date Value Ref Range Status  02/04/2021 51 > OR = 50 mg/dL Final  03/21/2017 41 >39 mg/dL Final   Triglycerides  Date Value Ref Range Status  02/04/2021 136 <150 mg/dL Final         Passed - Cr in normal range and within 360 days    Creatinine  Date Value Ref Range Status  05/19/2021 0.65 0.44 - 1.00 mg/dL Final  02/18/2013 0.8 0.6 - 1.1 mg/dL Final   Creat  Date Value Ref Range Status  02/04/2021 0.69 0.50 - 1.03 mg/dL Final   Creatinine, Ser  Date Value Ref Range Status  08/11/2021 0.59 0.40 - 1.20 mg/dL Final   Creatinine,U  Date Value Ref Range Status  06/07/2021 51.9 mg/dL Final         Passed - Patient is not pregnant      Passed -  Valid encounter within last 12 months    Recent Outpatient Visits          8 months ago Encounter for general adult medical examination with abnormal findings   [facility], Coralie Keens, NP      Future Appointments            In 2 months End, Harrell Gave, MD [place]. [place]

## 2021-10-28 ENCOUNTER — Other Ambulatory Visit: Payer: Self-pay

## 2021-10-28 MED FILL — Continuous Glucose System Sensor: 28 days supply | Qty: 2 | Fill #2 | Status: AC

## 2021-11-06 ENCOUNTER — Other Ambulatory Visit: Payer: Self-pay | Admitting: Internal Medicine

## 2021-11-06 ENCOUNTER — Other Ambulatory Visit: Payer: Self-pay

## 2021-11-07 NOTE — Telephone Encounter (Signed)
Requested medication (s) are due for refill today - yes  Requested medication (s) are on the active medication list -yes  Future visit scheduled -no  Last refill: 08/01/21 #30 2RF  Notes to clinic: Call to patient- patient is due AEX in 3 months- she wants RF until then. Advised will have provider review for RF request   Requested Prescriptions  Pending Prescriptions Disp Refills   losartan (COZAAR) 50 MG tablet 30 tablet 2    Sig: TAKE 1 TABLET (50 MG TOTAL) BY MOUTH DAILY.     Cardiovascular:  Angiotensin Receptor Blockers Failed - 11/06/2021  8:29 PM      Failed - Valid encounter within last 6 months    Recent Outpatient Visits           9 months ago Encounter for general adult medical examination with abnormal findings   [facility], Coralie Keens, NP       Future Appointments             In 2 months End, Harrell Gave, MD [place]. [facility] - Cr in normal range and within 180 days    Creatinine  Date Value Ref Range Status  05/19/2021 0.65 0.44 - 1.00 mg/dL Final  02/18/2013 0.8 0.6 - 1.1 mg/dL Final   Creat  Date Value Ref Range Status  02/04/2021 0.69 0.50 - 1.03 mg/dL Final   Creatinine, Ser  Date Value Ref Range Status  08/11/2021 0.59 0.40 - 1.20 mg/dL Final   Creatinine,U  Date Value Ref Range Status  06/07/2021 51.9 mg/dL Final         Passed - K in normal range and within 180 days    Potassium  Date Value Ref Range Status  08/11/2021 4.3 3.5 - 5.1 mEq/L Final  02/18/2013 4.4 3.5 - 5.1 mEq/L Final         Passed - Patient is not pregnant      Passed - Last BP in normal range    BP Readings from Last 1 Encounters:  08/11/21 118/76            Requested Prescriptions  Pending Prescriptions Disp Refills   losartan (COZAAR) 50 MG tablet 30 tablet 2    Sig: TAKE 1 TABLET (50 MG TOTAL) BY MOUTH DAILY.     Cardiovascular:  Angiotensin Receptor Blockers Failed  - 11/06/2021  8:29 PM      Failed - Valid encounter within last 6 months    Recent Outpatient Visits           9 months ago Encounter for general adult medical examination with abnormal findings   [facility], Coralie Keens, NP       Future Appointments             In 2 months End, Harrell Gave, MD [place]. [facility] - Cr in normal range and within 180 days    Creatinine  Date Value Ref Range Status  05/19/2021 0.65 0.44 - 1.00 mg/dL Final  02/18/2013 0.8 0.6 - 1.1 mg/dL Final   Creat  Date Value Ref Range Status  02/04/2021 0.69 0.50 - 1.03 mg/dL Final   Creatinine, Ser  Date Value Ref Range Status  08/11/2021 0.59 0.40 - 1.20 mg/dL  Final   Creatinine,U  Date Value Ref Range Status  06/07/2021 51.9 mg/dL Final         Passed - K in normal range and within 180 days    Potassium  Date Value Ref Range Status  08/11/2021 4.3 3.5 - 5.1 mEq/L Final  02/18/2013 4.4 3.5 - 5.1 mEq/L Final         Passed - Patient is not pregnant      Passed - Last BP in normal range    BP Readings from Last 1 Encounters:  08/11/21 118/76

## 2021-11-08 ENCOUNTER — Other Ambulatory Visit: Payer: Self-pay

## 2021-11-11 LAB — HM DIABETES EYE EXAM

## 2021-11-15 ENCOUNTER — Other Ambulatory Visit: Payer: Self-pay

## 2021-11-15 ENCOUNTER — Other Ambulatory Visit: Payer: Self-pay | Admitting: Internal Medicine

## 2021-11-15 ENCOUNTER — Encounter: Payer: Self-pay | Admitting: Internal Medicine

## 2021-11-15 ENCOUNTER — Encounter: Payer: Self-pay | Admitting: Endocrinology

## 2021-11-16 ENCOUNTER — Other Ambulatory Visit: Payer: Self-pay

## 2021-11-16 MED FILL — Losartan Potassium Tab 50 MG: ORAL | 30 days supply | Qty: 30 | Fill #0 | Status: AC

## 2021-11-16 NOTE — Telephone Encounter (Signed)
Courtesy refill. appt scheduled for 11/22/21. Patient requesting if [place] Falling will still be covered at office.  Requested Prescriptions  Pending Prescriptions Disp Refills  . losartan (COZAAR) 50 MG tablet 30 tablet 0    Sig: TAKE 1 TABLET (50 MG TOTAL) BY MOUTH DAILY.     Cardiovascular:  Angiotensin Receptor Blockers Failed - 11/15/2021  2:45 PM      Failed - Valid encounter within last 6 months    Recent Outpatient Visits          9 months ago Encounter for general adult medical examination with abnormal findings   [facility], Coralie Keens, NP      Future Appointments            In 6 days [place], Coralie Keens, NP [facility], [place]   In 1 month End, Harrell Gave, MD [place]. [facility] - Cr in normal range and within 180 days    Creatinine  Date Value Ref Range Status  05/19/2021 0.65 0.44 - 1.00 mg/dL Final  02/18/2013 0.8 0.6 - 1.1 mg/dL Final   Creat  Date Value Ref Range Status  02/04/2021 0.69 0.50 - 1.03 mg/dL Final   Creatinine, Ser  Date Value Ref Range Status  08/11/2021 0.59 0.40 - 1.20 mg/dL Final   Creatinine,U  Date Value Ref Range Status  06/07/2021 51.9 mg/dL Final         Passed - K in normal range and within 180 days    Potassium  Date Value Ref Range Status  08/11/2021 4.3 3.5 - 5.1 mEq/L Final  02/18/2013 4.4 3.5 - 5.1 mEq/L Final         Passed - Patient is not pregnant      Passed - Last BP in normal range    BP Readings from Last 1 Encounters:  08/11/21 118/76

## 2021-11-17 ENCOUNTER — Other Ambulatory Visit: Payer: Self-pay

## 2021-11-17 ENCOUNTER — Ambulatory Visit (INDEPENDENT_AMBULATORY_CARE_PROVIDER_SITE_OTHER): Payer: No Typology Code available for payment source | Admitting: Endocrinology

## 2021-11-17 ENCOUNTER — Encounter: Payer: Self-pay | Admitting: Endocrinology

## 2021-11-17 VITALS — BP 132/84 | HR 84 | Ht 64.0 in | Wt 207.0 lb

## 2021-11-17 DIAGNOSIS — E1142 Type 2 diabetes mellitus with diabetic polyneuropathy: Secondary | ICD-10-CM

## 2021-11-17 DIAGNOSIS — E1165 Type 2 diabetes mellitus with hyperglycemia: Secondary | ICD-10-CM

## 2021-11-17 DIAGNOSIS — I10 Essential (primary) hypertension: Secondary | ICD-10-CM

## 2021-11-17 DIAGNOSIS — Z794 Long term (current) use of insulin: Secondary | ICD-10-CM

## 2021-11-17 LAB — POCT GLYCOSYLATED HEMOGLOBIN (HGB A1C): Hemoglobin A1C: 6.5 % — AB (ref 4.0–5.6)

## 2021-11-17 MED ORDER — FREESTYLE LITE TEST VI STRP
1.0000 | ORAL_STRIP | Freq: Two times a day (BID) | 0 refills | Status: DC
  Filled 2021-11-17: qty 50, 25d supply, fill #0

## 2021-11-17 MED ORDER — FREESTYLE FREEDOM LITE W/DEVICE KIT
1.0000 | PACK | Freq: Two times a day (BID) | 0 refills | Status: AC
  Filled 2021-11-17: qty 1, 1d supply, fill #0

## 2021-11-17 NOTE — Patient Instructions (Signed)
Walk at lunch  Insulin at 5 am

## 2021-11-17 NOTE — Progress Notes (Signed)
Patient ID: Stacey Roberts, female   DOB: [birth year], 56 y.o.   MRN: [id number]            Reason for Appointment: Type II Diabetes follow-up   History of Present Illness   Diagnosis date: 2008  Previous history: She has been on insulin since 2017 Previous A1c range = 7.3-10.4 She was started on Ozempic in 2021 and this has been increased periodically and her last dose of 2 mg was started in 2/23  Recent history:     Non-insulin hypoglycemic drugs: Ozempic $RemoveBefore'2mg'[id number]$  weekly, metformin 2 g, Farxiga 5 mg daily     Insulin regimen: 40-45 units bioequivalent glargine in the morning daily           Side effects from medications: Mild nausea from Ozempic, nausea from Trulicity  Current self management, blood sugar patterns and problems identified:  Her A1c is further improved at 6.5 compared to 7.1  Has been able to only tolerate 5 mg Farxiga, could not tolerate 10 mg because of yeast infection She is taking 45 units of her insulin and is trying to take it sometime after waking up or not right away She is still seems to have a strong dawn phenomenon with blood sugars rising before breakfast Usually not having high postprandial readings after lunch and dinner despite only taking basal insulin Has been able to take Ozempic regularly recently without side effects or difficulty with refills She says she has long work hours and cannot find time to walk Has not lost any weight directed but any side effects She previously was taking variable doses of basal insulin based on daily fasting blood sugar and was told to take a steady dose Weight is about the same No hypoglycemia overnight  Exercise:  may walk a little  Diet management: She is eating very small portions but not always appearing to be getting protein and sometimes may just eat an English muffin or an apple for breakfast or snack Otherwise she  get some protein      Interpretation of her freestyle libre version 3 download for the last  2 weeks as follows  Blood sugars are highest around midday and lowest before dinner  She has blood sugars in the overnight Timespan in the low 100 range, as low as 120 average but usually start rising after about 6-7 AM Overall variability is minimal HYPERGLYCEMIA is occurring primarily in the morning hours before noon POSTPRANDIAL hyperglycemia is generally not pronounced Blood sugars are rising modestly after breakfast but also has high Premeal readings at breakfast time around 9 AM Blood sugars after dinner are level with Premeal readings and blood sugars are stable after lunch also No hypoglycemia seen with rarely low normal blood sugars between 11 PM-3 AM  CGM use % of time 95  2-week average/GV 136/19  Time in range 94       %  % Time Above 180 6  % Time above 250   % Time Below 70      PRE-MEAL Fasting Lunch Dinner Bedtime Overall  Glucose range:       Averages: 136       POST-MEAL PC Breakfast PC Lunch PC Dinner  Glucose range:     Averages: 170 157 128   Previously  CGM use % of time 95  2-week average/GV 153/21  Time in range      81  %  % Time Above 180 18+1  % Time above 250   %  Time Below 70 0     PRE-MEAL Fasting Lunch Dinner 12 AM-2 AM Overall  Glucose range:       Averages: 171   128 153   POST-MEAL PC Breakfast PC Lunch PC Dinner  Glucose range:     Averages: 194 169 165    Previously:  PRE-MEAL Fasting Lunch Dinner Bedtime Overall  Glucose range: 120-205      Mean/median:        POST-MEAL PC Breakfast PC Lunch PC Dinner  Glucose range:   160-180  Mean/median:        Hypoglycemia:  none                        Dietician visit: Most recent: 2011 with classes  Weight control:  Wt Readings from Last 3 Encounters:  11/17/21 207 lb (93.9 kg)  08/11/21 208 lb 12.8 oz (94.7 kg)  07/08/21 205 lb (93 kg)             Diabetes labs:  Lab Results  Component Value Date   HGBA1C 6.5 (A) 11/17/2021   HGBA1C 7.1 (A) 06/07/2021   HGBA1C 7.6  (A) 03/10/2021   Lab Results  Component Value Date   MICROALBUR 0.9 06/07/2021   LDLCALC 44 02/04/2021   CREATININE 0.59 08/11/2021     Allergies as of 11/17/2021       Reactions   Ace Inhibitors Cough   REACTION: dry cough   Penicillins Shortness Of Breath, Rash   Pt has tolerated Cephalexin in 2017 and again in 2018.  Has patient had a PCN reaction causing immediate rash, facial/tongue/throat swelling, SOB or lightheadedness with hypotension: Yes Has patient had a PCN reaction causing severe rash involving mucus membranes or skin necrosis: Yes Has patient had a PCN reaction that required hospitalization No Has patient had a PCN reaction occurring within the last 10 years: No If all of the above answers are "NO", then may proceed with Cephalosporin use.   Clindamycin/lincomycin Other (See Comments)   Severe stomach cramps   Ciprofloxacin Itching        Medication List        Accurate as of November 17, 2021  5:01 PM. If you have any questions, ask your nurse or doctor.          aspirin EC 81 MG tablet Take 1 tablet (81 mg total) by mouth daily.   Farxiga 5 MG Tabs tablet Generic drug: dapagliflozin propanediol TAKE 1 TABLET BY MOUTH DAILY.   freestyle lancets 1 each by Other route 2 (two) times daily. [name]   FreeStyle Libre 3 Sensor Misc Apply 1 sensor on upper arm every 14 days for continuous glucose monitoring (1 Device by Does not apply route every 14 (fourteen) days. Apply 1 sensor on upper arm every 14 days for continuous glucose monitoring)   FreeStyle Lite Devi 1 each by Does not apply route 2 (two) times daily. [name]   FREESTYLE LITE test strip Generic drug: glucose blood Use 2 (two) times daily. (dx [name])   gabapentin 300 MG capsule Commonly known as: NEURONTIN Take 600 mg by mouth at bedtime as needed.   levothyroxine 112 MCG tablet Commonly known as: SYNTHROID Take 1 tablet (112 mcg total) by mouth daily.   losartan 50 MG  tablet Commonly known as: COZAAR TAKE 1 TABLET (50 MG TOTAL) BY MOUTH DAILY.   metFORMIN 500 MG 24 hr tablet Commonly known as: GLUCOPHAGE-XR TAKE 4 TABLETS BY MOUTH DAILY  WITH BREAKFAST   metoprolol tartrate 100 MG tablet Commonly known as: LOPRESSOR Take 1 tablet (100 mg total) by mouth 2 (two) times daily.   multivitamin with minerals tablet Take 1 tablet by mouth daily. Woman's Gummies   neomycin-polymyxin b-dexamethasone 3.5-10000-0.1 Oint Commonly known as: MAXITROL Apply a small amount into affected eye(s) at bedtime (Apply 1 a small amount into affected eye at bedtime)   neomycin-polymyxin b-dexamethasone 3.5-10000-0.1 Susp Commonly known as: MAXITROL Instill 1 drop into affected eye(s) three times a day (Apply 1 drop into affected eye three times a day)   nitroGLYCERIN 0.4 MG SL tablet Commonly known as: Nitrostat Place 1 tablet (0.4 mg total) under the tongue every 5 (five) minutes as needed for chest pain.   Ozempic (2 MG/DOSE) 8 MG/3ML Sopn Generic drug: Semaglutide (2 MG/DOSE) Inject 2 mg into the skin once a week.   pantoprazole 40 MG tablet Commonly known as: PROTONIX TAKE 1 TABLET BY MOUTH DAILY.   PARoxetine 10 MG tablet Commonly known as: Paxil Take 1 tablet (10 mg total) by mouth daily.   pregabalin 75 MG capsule Commonly known as: LYRICA Take 1 capsule (75 mg total) by mouth daily.   rosuvastatin 20 MG tablet Commonly known as: CRESTOR Take 1 tablet (20 mg total) by mouth daily.   Semglee (yfgn) 100 UNIT/ML Pen Generic drug: insulin glargine-yfgn Inject 45 Units into the skin every morning.   Unifine Pentips 31G X 8 MM Misc Generic drug: Insulin Pen Needle use to inject twice a day        Allergies:  Allergies  Allergen Reactions   Ace Inhibitors Cough    REACTION: dry cough   Penicillins Shortness Of Breath and Rash    Pt has tolerated Cephalexin in 2017 and again in 2018.   Has patient had a PCN reaction causing immediate  rash, facial/tongue/throat swelling, SOB or lightheadedness with hypotension: Yes Has patient had a PCN reaction causing severe rash involving mucus membranes or skin necrosis: Yes Has patient had a PCN reaction that required hospitalization No Has patient had a PCN reaction occurring within the last 10 years: No If all of the above answers are "NO", then may proceed with Cephalosporin use.    Clindamycin/Lincomycin Other (See Comments)    Severe stomach cramps   Ciprofloxacin Itching    Past Medical History:  Diagnosis Date   Allergy    seasonal   BRCA negative 2017   Breast cancer ([place]) 2014   Breast cancer ([place]) 2017   Bronchitis    hx of   Coronary artery disease 06/2019   80% mid RCA stenosis - medical management   Diabetes mellitus, type II ([place])    Endometrial hyperplasia 2014   Mirena placed; Dr. 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 Buddy Duty  Past Surgical History:  Procedure Laterality Date   APPLICATION OF A-CELL OF EXTREMITY Left 09/29/2020   Procedure: placement of ACell;  Surgeon: Wallace Going, DO;  Location: [facility];  Service: Plastics;  Laterality: Left;   BREAST BIOPSY  2000   s/p   BREAST BIOPSY Left 01/22/2013   Procedure: RE-EXCISION LEFT BREAST DCIS;  Surgeon: Harl Bowie, MD;  Location: [facility];  Service: General;  Laterality: Left;   BREAST  LUMPECTOMY Left 01/06/2013   Procedure: LUMPECTOMY;  Surgeon: Harl Bowie, MD;  Location: [facility];  Service: General;  Laterality: Left;   BREAST LUMPECTOMY Left 2017   BREAST LUMPECTOMY WITH NEEDLE LOCALIZATION AND AXILLARY SENTINEL LYMPH NODE BX Left 09/30/2015   Procedure: Left BREAST LUMPECTOMY WITH NEEDLE LOCALIZATION AND AXILLARY SENTINEL LYMPH NODE BX;  Surgeon: Coralie Keens, MD;  Location: [facility];  Service: General;  Laterality: Left;   BREAST RECONSTRUCTION Bilateral 10/12/2015   Procedure: BREAST oncoplastic RECONSTRUCTION;  Surgeon: Irene Limbo, MD;  Location: [facility];  Service: Plastics;  Laterality: Bilateral;   BREAST REDUCTION SURGERY Bilateral 10/12/2015   Procedure: MAMMARY REDUCTION  (BREAST);  Surgeon: Irene Limbo, MD;  Location: [facility];  Service: Plastics;  Laterality: Bilateral;   CORONARY ANGIOPLASTY     DILATION AND CURETTAGE OF UTERUS  2004   s/p   EXCISION OF BREAST LESION Left 10/12/2015   Procedure: Re-EXCISION OF BREAST;  Surgeon: Coralie Keens, MD;  Location: [facility];  Service: General;  Laterality: Left;   INCISION AND DRAINAGE OF WOUND Left 09/29/2020   Procedure: Excision of back and left breast wound;  Surgeon: Wallace Going, DO;  Location: [facility];  Service: Plastics;  Laterality: Left;  1 hour   LATISSIMUS FLAP TO BREAST Left 07/19/2020   Procedure: Left breast radiation necrosis excision with latissimus muscle flap;  Surgeon: Wallace Going, DO;  Location: [facility];  Service: Plastics;  Laterality: Left;  3 hours   LEFT HEART CATH AND CORONARY ANGIOGRAPHY N/A 06/27/2019   Procedure: LEFT HEART CATH AND CORONARY ANGIOGRAPHY;  Surgeon: Nelva Bush, MD;  Location: [facility];  Service: Cardiovascular;  Laterality: N/A;   REDUCTION MAMMAPLASTY Bilateral 09/2015   THYROIDECTOMY  02/2009   Dr Harlow Asa   TONSILLECTOMY  1982   TONSILLECTOMY      Family  History  Problem Relation Age of Onset   CAD Mother 69       Died age 38 of CAD   Heart disease Mother        CABG x 2   Graves' disease Mother    Hypertension Mother    Thyroid disease Mother    Heart attack Mother 44   Dementia Father        deceased age 73 secondary to dementia   Bipolar disorder Father    Emphysema Father    Heart disease Father    COPD Father        smoker   Hypertension Father    Depression Father    Alcoholism Father    Other Sister 57       hysterectomy for fibroids   Heart disease Sister        CABG x 2   Heart attack Sister 69   Prostate cancer Brother        low grade; w/ surveillance   Thyroid nodules Brother 33   Heart disease Brother 23  CABG x 4   CAD Maternal Grandfather 69   Heart disease Maternal Grandfather    Heart attack Maternal Grandfather 41   Kidney cancer Cousin        maternal 1st cousin dx 80-47; former smoker   Fibroids Other        niece dx approx 44   Hypertension Other    Lung cancer Other        maternal great aunt (MGM's sister); not a smoker   Cancer Other        nephew dx neuroblastoma at 86.52 years old   Colon cancer Neg Hx    Colon polyps Neg Hx     Social History:  reports that she quit smoking about 14 years ago. Her smoking use included cigarettes. She smoked an average of .5 packs per day. She has never used smokeless tobacco. She reports that she does not currently use alcohol. She reports that she does not use drugs.  Review of Systems:  THYROID cancer: This was diagnosed in 2011 and she had a 1.7 cm follicular variant of papillary thyroid carcinoma confined to the thyroid but showing capsular invasion and angiolymphatic invasion  Treated with total thyroidectomy in 2011 and followed with 103 mCi of I-131 Whole-body scan in 2012 was negative She did have a single enlarged right cervical lymph node that was stable in 2012 and 2013  THYROGLOBULIN levels have been consistently undetectable  She  has been since 5/23 on LEVOTHYROXINE 112 mcg daily which she takes regularly in the morning   Lab Results  Component Value Date   TSH 0.71 08/11/2021   TSH 0.04 (L) 06/07/2021   TSH 0.41 02/03/2020   FREET4 1.13 08/11/2021   FREET4 1.55 06/07/2021   FREET4 1.36 02/03/2020   Hypertension: She has been treated with losartan 50 mg with recent blood pressure readings as follows, also on Farxiga  BP Readings from Last 3 Encounters:  11/17/21 132/84  08/11/21 118/76  07/08/21 118/90    Lipids: She has been treated with CRESTOR 20 mg daily, also has known CAD No side effects listed as muscle aches LDL well controlled    Lab Results  Component Value Date   CHOL 117 02/04/2021   CHOL 121 02/03/2020   CHOL 104 03/21/2019   Lab Results  Component Value Date   HDL 51 02/04/2021   HDL 51.80 02/03/2020   HDL 35.50 (L) 03/21/2019   Lab Results  Component Value Date   LDLCALC 44 02/04/2021   LDLCALC 46 02/03/2020   LDLCALC 34 03/21/2019   Lab Results  Component Value Date   TRIG 136 02/04/2021   TRIG 115.0 02/03/2020   TRIG 172.0 (H) 03/21/2019   Lab Results  Component Value Date   CHOLHDL 2.3 02/04/2021   CHOLHDL 2 02/03/2020   CHOLHDL 3 03/21/2019   Lab Results  Component Value Date   LDLDIRECT 153.0 09/01/2014   LDLDIRECT 157.0 02/03/2014   LDLDIRECT 141.7 11/28/2012   She is up-to-date with eye exams    Examination:   BP 132/84   Pulse 84   Ht _0  (1.626 m)   Wt 207 lb (93.9 kg)   LMP 06/24/2010 (Approximate)   SpO2 97%   BMI 35.53 kg/m   Body mass index is 35.53 kg/m.    ASSESSMENT/ PLAN:    Diabetes type 2 on glargine insulin, Ozempic 2 mg, Farxiga 5 mg and metformin:   Blood glucose control is relatively better with A1c 6.5 GMI on sensor  6.6  With Ozempic as well as basal insulin her blood sugars are excellent but she has significant dawn phenomenon as discussed above Although she has reasonably good diet and weight is stable she can do  better with exercise  Recommendations:  She will need to take her GLARGINE insulin as soon as she wakes up around 5 AM when she takes her thyroid medicine May consider option of adding some regular insulin or Humalog but she is not going to add another insulin She does need to start aerobic exercise such as walking during her lunch hour Continue to monitor blood sugars and improve diet No change in insulin dose     HYPERTENSION: Blood pressure high normal She will continue to follow-up with her PCP  Hypothyroidism: She is due to see her PCP soon for follow-up and can have her labs rechecked along with her lipids   Patient Instructions  Walk at lunch  Insulin at 5 am   Elayne Snare 11/17/2021, 5:01 PM     Elayne Snare

## 2021-11-18 ENCOUNTER — Other Ambulatory Visit: Payer: Self-pay

## 2021-11-21 ENCOUNTER — Other Ambulatory Visit: Payer: Self-pay

## 2021-11-22 ENCOUNTER — Encounter: Payer: Self-pay | Admitting: Endocrinology

## 2021-11-22 ENCOUNTER — Encounter: Payer: Self-pay | Admitting: Internal Medicine

## 2021-11-22 ENCOUNTER — Other Ambulatory Visit: Payer: Self-pay

## 2021-11-22 ENCOUNTER — Ambulatory Visit (INDEPENDENT_AMBULATORY_CARE_PROVIDER_SITE_OTHER): Payer: No Typology Code available for payment source | Admitting: Internal Medicine

## 2021-11-22 DIAGNOSIS — I471 Supraventricular tachycardia, unspecified: Secondary | ICD-10-CM

## 2021-11-22 DIAGNOSIS — E785 Hyperlipidemia, unspecified: Secondary | ICD-10-CM

## 2021-11-22 DIAGNOSIS — I10 Essential (primary) hypertension: Secondary | ICD-10-CM

## 2021-11-22 DIAGNOSIS — Z853 Personal history of malignant neoplasm of breast: Secondary | ICD-10-CM

## 2021-11-22 DIAGNOSIS — F419 Anxiety disorder, unspecified: Secondary | ICD-10-CM

## 2021-11-22 DIAGNOSIS — Z6835 Body mass index (BMI) 35.0-35.9, adult: Secondary | ICD-10-CM

## 2021-11-22 DIAGNOSIS — I25118 Atherosclerotic heart disease of native coronary artery with other forms of angina pectoris: Secondary | ICD-10-CM

## 2021-11-22 DIAGNOSIS — C73 Malignant neoplasm of thyroid gland: Secondary | ICD-10-CM

## 2021-11-22 DIAGNOSIS — E1169 Type 2 diabetes mellitus with other specified complication: Secondary | ICD-10-CM

## 2021-11-22 DIAGNOSIS — E890 Postprocedural hypothyroidism: Secondary | ICD-10-CM

## 2021-11-22 DIAGNOSIS — E1142 Type 2 diabetes mellitus with diabetic polyneuropathy: Secondary | ICD-10-CM

## 2021-11-22 DIAGNOSIS — K219 Gastro-esophageal reflux disease without esophagitis: Secondary | ICD-10-CM | POA: Diagnosis not present

## 2021-11-22 DIAGNOSIS — F32A Depression, unspecified: Secondary | ICD-10-CM

## 2021-11-22 MED ORDER — PANTOPRAZOLE SODIUM 40 MG PO TBEC
40.0000 mg | DELAYED_RELEASE_TABLET | Freq: Every day | ORAL | 11 refills | Status: DC
  Filled 2021-11-22 – 2022-01-19 (×2): qty 90, 90d supply, fill #0
  Filled 2022-04-23: qty 90, 90d supply, fill #1
  Filled 2022-07-30: qty 90, 90d supply, fill #2
  Filled 2022-10-30: qty 90, 90d supply, fill #3

## 2021-11-22 NOTE — Assessment & Plan Note (Signed)
In remission.

## 2021-11-22 NOTE — Assessment & Plan Note (Signed)
No angina Continue rosuvastatin and aspirin Encouraged her to consume a low fat diet

## 2021-11-22 NOTE — Progress Notes (Signed)
Subjective:    Patient ID: Stacey Roberts, female    DOB: 1965-03-14, 56 y.o.   MRN: [id number]  HPI  Patient presents to clinic today for follow-up of chronic conditions.  History of Breast Cancer: Status postlumpectomy, radiation, reduction and reconstruction. She follows with oncology.  DM2: Her [name] was 6.5%, 10/2021.  Her sugars range 60-200.  She is taking [place], Metformin, Ozempic and Semglee as prescribed.  She takes Pregabalin daily and Gabapentin as needed for neuropathic pain.  She follows with podiatry and endocrinology.  Her last eye exam was 10/2021.  Flu 10/2021.  Pneumovax 2007.  [place] x 2.  GERD: Triggered by everything she eats.  She denies breakthrough on Pantoprazole.  There is no upper GI on file.  HLD with CAD: Her last LDL was 44, triglycerides 136, 01/2021.  She denies myalgias on Rosuvastatin.  She does not consume a low-fat diet.  She follows with cardiology.  HTN/PSVT: Her BP today is 113/72.  She is taking Losartan and Metoprolol as prescribed.  ECG from 06/2021 reviewed.  Hypothyroidism: Postsurgical secondary to thyroid cancer.  She denies any issues on her current dose of Levothyroxine.  She follows with endocrinology.  Anxiety and Depression: Chronic, she is no longer taking Paroxetine.  She is not currently seeing a therapist.  She denies SI/HI.  Review of Systems  Past Medical History:  Diagnosis Date   Allergy    seasonal   BRCA negative 2017   Breast cancer ([place]) 2014   Breast cancer ([place]) 2017   Bronchitis    hx of   Coronary artery disease 06/2019   80% mid RCA stenosis - medical management   Diabetes mellitus, type II ([place])    Endometrial hyperplasia 2014   Mirena placed; Dr. 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 Buddy Duty    Current Outpatient Medications  Medication Sig Dispense Refill   aspirin EC 81 MG tablet Take 1 tablet (81 mg total) by mouth daily. 90 tablet 3   Blood Glucose Monitoring Suppl (FREESTYLE FREEDOM LITE) w/Device KIT test twice daily 1 kit 0   Continuous Blood Gluc Sensor (FREESTYLE LIBRE 3 SENSOR) MISC 1 Device by Does not apply route every 14 (fourteen) days. Apply 1 sensor on upper arm every 14 days for continuous glucose monitoring 2 each 2   dapagliflozin propanediol (FARXIGA) 5 MG TABS tablet TAKE 1 TABLET BY MOUTH DAILY. 90 tablet 0   gabapentin (NEURONTIN) 300 MG capsule Take 600 mg by mouth at bedtime as needed.     glucose blood (FREESTYLE LITE) test strip Use 2 (two) times daily. (dx [name]) 50 each 0   insulin glargine-yfgn (SEMGLEE) 100 UNIT/ML Pen Inject 45 Units into the skin every morning. 45 mL 3   Insulin Pen Needle (UNIFINE PENTIPS) 31G  X 8 MM MISC use to inject twice a day 100 each 3   Lancets (FREESTYLE) lancets 1 each by Other route 2 (two) times daily. [name] 200 each 0   levothyroxine (SYNTHROID) 112 MCG tablet Take 1 tablet (112 mcg total) by mouth daily. 90 tablet 3   losartan (COZAAR) 50 MG tablet TAKE 1 TABLET (50 MG TOTAL) BY MOUTH DAILY. 30 tablet 0   metFORMIN (GLUCOPHAGE-XR) 500 MG 24 hr tablet TAKE 4 TABLETS BY MOUTH DAILY WITH BREAKFAST 360 tablet 3   metoprolol tartrate (LOPRESSOR) 100 MG tablet Take 1 tablet (100 mg total) by mouth 2 (two) times daily. 180 tablet 2   Multiple Vitamins-Minerals (MULTIVITAMIN WITH MINERALS) tablet Take 1 tablet by mouth daily. Woman's Gummies     neomycin-polymyxin  b-dexamethasone (MAXITROL) 3.5-10000-0.1 OINT Apply 1 a small amount into affected eye at bedtime 3.5 g 0   neomycin-polymyxin b-dexamethasone (MAXITROL) 3.5-10000-0.1 SUSP Apply 1 drop into affected eye three times a day 5 mL 1   nitroGLYCERIN (NITROSTAT) 0.4 MG SL tablet Place 1 tablet (0.4 mg total) under the tongue every 5 (five) minutes as needed for chest pain. 25 tablet prn   pantoprazole (PROTONIX) 40 MG tablet TAKE 1 TABLET BY MOUTH DAILY. 90 tablet 2   PARoxetine (PAXIL) 10 MG tablet Take 1 tablet (10 mg total) by mouth daily. 30 tablet 2   pregabalin (LYRICA) 75 MG capsule Take 1 capsule (75 mg total) by mouth daily. 90 capsule 3   rosuvastatin (CRESTOR) 20 MG tablet Take 1 tablet (20 mg total) by mouth daily. 90 tablet 1   Semaglutide, 2 MG/DOSE, (OZEMPIC, 2 MG/DOSE,) 8 MG/3ML SOPN Inject 2 mg into the skin once a week. 9 mL 3   No current facility-administered medications for this visit.    Allergies  Allergen Reactions   Ace Inhibitors Cough    REACTION: dry cough   Penicillins Shortness Of Breath and Rash    Pt has tolerated Cephalexin in 2017 and again in 2018.   Has patient had a PCN reaction causing immediate rash, facial/tongue/throat swelling, SOB or lightheadedness with hypotension: Yes Has patient had a PCN reaction causing severe rash involving mucus membranes or skin necrosis: Yes Has patient had a PCN reaction that required hospitalization No Has patient had a PCN reaction occurring within the last 10 years: No If all of the above answers are "NO", then may proceed with Cephalosporin use.    Clindamycin/Lincomycin Other (See Comments)    Severe stomach cramps   Ciprofloxacin Itching    Family History  Problem Relation Age of Onset   CAD Mother 5       Died age 49 of CAD   Heart disease Mother        CABG x 2   Graves' disease Mother    Hypertension Mother    Thyroid disease Mother    Heart attack Mother 58   Dementia Father        deceased age 62  secondary to dementia   Bipolar disorder Father    Emphysema Father    Heart disease Father    COPD Father        smoker   Hypertension Father    Depression Father    Alcoholism Father    Other Sister 67       hysterectomy for fibroids   Heart disease Sister        CABG x 2   Heart attack Sister 6   Prostate cancer Brother  low grade; w/ surveillance   Thyroid nodules Brother 59   Heart disease Brother 30       CABG x 4   CAD Maternal Grandfather 50   Heart disease Maternal Grandfather    Heart attack Maternal Grandfather 45   Kidney cancer Cousin        maternal 1st cousin dx 46-47; former smoker   Fibroids Other        niece dx approx 22   Hypertension Other    Lung cancer Other        maternal great aunt (MGM's sister); not a smoker   Cancer Other        nephew dx neuroblastoma at 12.28 years old   Colon cancer Neg Hx    Colon polyps Neg Hx     Social History   Socioeconomic History   Marital status: Single    Spouse name: Not on file   Number of children: Not on file   Years of education: Not on file   Highest education level: Not on file  Occupational History    Employer: [employer]    Comment: Outpatient scheduling in radiology  Tobacco Use   Smoking status: Former    Packs/day: 0.50    Types: Cigarettes    Quit date: 09/29/2007    Years since quitting: 14.1   Smokeless tobacco: Never   Tobacco comments:    Smoked on and off for 20 years. Quit about 8 years ago (as of 08/2015)  Vaping Use   Vaping Use: Never used  Substance and Sexual Activity   Alcohol use: Not Currently    Alcohol/week: 0.0 standard drinks of alcohol   Drug use: No   Sexual activity: Not Currently  Other Topics Concern   Not on file  Social History Narrative   The patient is divorced, moved to [place] in 2006 from [place].  She does not have any children, currently liveswith her boyfriend and is working as a Nurse, adult for [employer].  No alcohol.   Tobacco use:  She quit 5 years ago.  She smoked onand off for approximately 20 years.  No history of recreational drugUse. Drinks two cups of coffee per work day.    Social Determinants of Health   Financial Resource Strain: Not on file  Food Insecurity: Not on file  Transportation Needs: Not on file  Physical Activity: Not on file  Stress: Not on file  Social Connections: Not on file  Intimate Partner Violence: Not on file     Constitutional: Denies fever, malaise, fatigue, headache or abrupt weight changes.  HEENT: Denies eye pain, eye redness, ear pain, ringing in the ears, wax buildup, runny nose, nasal congestion, bloody nose, or sore throat. Respiratory: Denies difficulty breathing, shortness of breath, cough or sputum production.   Cardiovascular: Denies chest pain, chest tightness, palpitations or swelling in the hands or feet.  Gastrointestinal: Denies abdominal pain, bloating, constipation, diarrhea or blood in the stool.  GU: Denies urgency, frequency, pain with urination, burning sensation, blood in urine, odor or discharge. Musculoskeletal: Denies decrease in range of motion, difficulty with gait, muscle pain or joint pain and swelling.  Skin: Denies redness, rashes, lesions or ulcercations.  Neurological: Pt reports neuropathic pain. Denies dizziness, difficulty with memory, difficulty with speech or problems with balance and coordination.  Psych: Patient has a history of anxiety and depression.  DeniesSI/HI.  No other specific complaints in a complete review of systems (except as listed in HPI  above).     Objective:   Physical Exam   BP 113/72 (BP Location: Right Wrist, Patient Position: Sitting, Cuff Size: Small)   Pulse 86   Temp (!) 96.9 F (36.1 C) (Temporal)   Wt 207 lb (93.9 kg)   LMP 06/24/2010 (Approximate)   SpO2 97%   BMI 35.53 kg/m   Wt Readings from Last 3 Encounters:  11/17/21 207 lb (93.9 kg)  08/11/21 208 lb 12.8 oz (94.7 kg)  07/08/21 205 lb  (93 kg)    General: Appears her stated age, obese, in NAD. Skin: Warm, dry and intact. No  ulcerations noted. HEENT: Head: normal shape and size; Eyes: sclera white, no icterus, conjunctiva pink, PERRLA and EOMs intact;  Neck:  Neck supple, trachea midline. No masses, lumps present.  Cardiovascular: Normal rate and rhythm. S1,S2 noted.  No murmur, rubs or gallops noted. Trace BLE edema. No carotid bruits noted. Pulmonary/Chest: Normal effort and positive vesicular breath sounds. No respiratory distress. No wheezes, rales or ronchi noted.  Abdomen: Soft and nontender. Normal bowel sounds. No distention or masses noted. Liver, spleen and kidneys non palpable. Musculoskeletal: No difficulty with gait.  Neurological: Alert and oriented. Cranial nerves II-XII grossly intact. Coordination normal.  Psychiatric: Mood and affect normal. Behavior is normal. Judgment and thought content normal.   BMET    Component Value Date/Time   NA 140 08/11/2021 1543   NA 138 03/21/2017 1018   NA 137 02/18/2013 1554   K 4.3 08/11/2021 1543   K 4.4 02/18/2013 1554   CL 101 08/11/2021 1543   CO2 28 08/11/2021 1543   CO2 25 02/18/2013 1554   GLUCOSE 104 (H) 08/11/2021 1543   GLUCOSE 300 (H) 02/18/2013 1554   BUN 14 08/11/2021 1543   BUN 16 03/21/2017 1018   BUN 14.2 02/18/2013 1554   CREATININE 0.59 08/11/2021 1543   CREATININE 0.65 05/19/2021 0845   CREATININE 0.69 02/04/2021 1340   CREATININE 0.8 02/18/2013 1554   CALCIUM 9.6 08/11/2021 1543   CALCIUM 9.2 02/18/2013 1554   GFRNONAA >60 05/19/2021 0845   GFRAA >60 08/28/2019 0852   GFRAA >60 07/16/2017 1442    Lipid Panel     Component Value Date/Time   CHOL 117 02/04/2021 1340   CHOL 122 03/21/2017 1018   TRIG 136 02/04/2021 1340   HDL 51 02/04/2021 1340   HDL 41 03/21/2017 1018   CHOLHDL 2.3 02/04/2021 1340   VLDL 23.0 02/03/2020 0854   LDLCALC 44 02/04/2021 1340    CBC    Component Value Date/Time   WBC 7.7 05/19/2021 0845   WBC  10.1 07/09/2020 0852   RBC 4.65 05/19/2021 0845   HGB 13.1 05/19/2021 0845   HGB 13.5 03/21/2017 1018   HGB 12.5 02/18/2013 1554   HCT 39.8 05/19/2021 0845   HCT 42.0 03/21/2017 1018   HCT 39.6 02/18/2013 1554   PLT 250 05/19/2021 0845   PLT 306 02/18/2013 1554   MCV 85.6 05/19/2021 0845   MCV 86 03/21/2017 1018   MCV 82.6 02/18/2013 1554   MCH 28.2 05/19/2021 0845   MCHC 32.9 05/19/2021 0845   RDW 13.1 05/19/2021 0845   RDW 14.6 03/21/2017 1018   RDW 16.4 (H) 02/18/2013 1554   LYMPHSABS 1.4 05/19/2021 0845   LYMPHSABS 1.5 03/21/2017 1018   LYMPHSABS 2.0 02/18/2013 1554   MONOABS 0.5 05/19/2021 0845   MONOABS 0.6 02/18/2013 1554   EOSABS 0.2 05/19/2021 0845   EOSABS 0.1 03/21/2017 1018   BASOSABS 0.0 05/19/2021  0845   BASOSABS 0.0 03/21/2017 1018   BASOSABS 0.0 02/18/2013 1554    Hgb A1C Lab Results  Component Value Date   HGBA1C 6.5 (A) 11/17/2021           Assessment & Plan:      RTC in 6 months for your annual exam [name], NP

## 2021-11-22 NOTE — Assessment & Plan Note (Signed)
Encouraged weight loss as this can help reduce reflux symptoms Continue pantoprazole

## 2021-11-22 NOTE — Assessment & Plan Note (Signed)
Controlled on metoprolol She will continue to follow with cardiology

## 2021-11-22 NOTE — Assessment & Plan Note (Signed)
In remission Continue levothyroxine

## 2021-11-22 NOTE — Assessment & Plan Note (Signed)
Encouraged her to consume a low fat diet Continue rosuvastatin

## 2021-11-22 NOTE — Patient Instructions (Signed)

## 2021-11-22 NOTE — Assessment & Plan Note (Signed)
Continue levothyroxine 

## 2021-11-22 NOTE — Assessment & Plan Note (Signed)
Did not tolerate SSRI therapy Support offered

## 2021-11-22 NOTE — Assessment & Plan Note (Signed)
Encouraged diet and exercise for weight loss ?

## 2021-11-22 NOTE — Assessment & Plan Note (Signed)
Recent a1c reviewed Urine microalbumin has been checked within the last year Encouraged low carb diet and exercise for weight loss Continue farxiga, metformin, ozempic and semglee Encouraged routine eye exam Encouraged routine foot exam Flu shot UTD Encouraged her to get a covid booster

## 2021-11-22 NOTE — Assessment & Plan Note (Signed)
Controlled on losartan and metoprolol Reinforced DASH diet and exercise for weight loss

## 2021-11-23 ENCOUNTER — Other Ambulatory Visit: Payer: Self-pay | Admitting: Endocrinology

## 2021-11-23 ENCOUNTER — Other Ambulatory Visit: Payer: Self-pay

## 2021-11-23 LAB — CBC
HCT: 37.9 % (ref 35.0–45.0)
Hemoglobin: 12.6 g/dL (ref 11.7–15.5)
MCH: 28.4 pg (ref 27.0–33.0)
MCHC: 33.2 g/dL (ref 32.0–36.0)
MCV: 85.4 fL (ref 80.0–100.0)
MPV: 10.5 fL (ref 7.5–12.5)
Platelets: 310 10*3/uL (ref 140–400)
RBC: 4.44 10*6/uL (ref 3.80–5.10)
RDW: 13.3 % (ref 11.0–15.0)
WBC: 10.5 10*3/uL (ref 3.8–10.8)

## 2021-11-23 LAB — LIPID PANEL
Cholesterol: 136 mg/dL (ref <200)
HDL: 54 mg/dL (ref >=50)
LDL Cholesterol (Calc): 55 mg/dL (calc)
Non-HDL Cholesterol (Calc): 82 mg/dL (calc) (ref <130)
Total CHOL/HDL Ratio: 2.5 (calc) (ref <5.0)
Triglycerides: 204 mg/dL — ABNORMAL HIGH (ref <150)

## 2021-11-23 LAB — COMPLETE METABOLIC PANEL WITH GFR
AG Ratio: 1.4 (calc) (ref 1.0–2.5)
ALT: 10 U/L (ref 6–29)
AST: 10 U/L (ref 10–35)
Albumin: 3.9 g/dL (ref 3.6–5.1)
Alkaline phosphatase (APISO): 80 U/L (ref 37–153)
BUN: 15 mg/dL (ref 7–25)
CO2: 25 mmol/L (ref 20–32)
Calcium: 9.2 mg/dL (ref 8.6–10.4)
Chloride: 102 mmol/L (ref 98–110)
Creat: 0.61 mg/dL (ref 0.50–1.03)
Globulin: 2.8 g/dL (calc) (ref 1.9–3.7)
Glucose, Bld: 93 mg/dL (ref 65–99)
Potassium: 4.2 mmol/L (ref 3.5–5.3)
Sodium: 139 mmol/L (ref 135–146)
Total Bilirubin: 0.5 mg/dL (ref 0.2–1.2)
Total Protein: 6.7 g/dL (ref 6.1–8.1)
eGFR: 105 mL/min/{1.73_m2} (ref >=60)

## 2021-11-23 LAB — T4, FREE: Free T4: 1.3 ng/dL (ref 0.8–1.8)

## 2021-11-23 LAB — TSH: TSH: 0.64 mIU/L (ref 0.40–4.50)

## 2021-11-23 MED ORDER — FREESTYLE LIBRE 3 SENSOR MISC
1.0000 | 2 refills | Status: DC
  Filled 2021-11-23: qty 2, 28d supply, fill #0
  Filled 2021-12-19: qty 2, 28d supply, fill #1
  Filled 2022-01-19: qty 2, 28d supply, fill #2

## 2021-11-28 ENCOUNTER — Other Ambulatory Visit: Payer: Self-pay

## 2021-11-28 ENCOUNTER — Other Ambulatory Visit: Payer: Self-pay | Admitting: Internal Medicine

## 2021-11-28 ENCOUNTER — Other Ambulatory Visit: Payer: Self-pay | Admitting: Podiatry

## 2021-11-28 MED ORDER — DAPAGLIFLOZIN PROPANEDIOL 5 MG PO TABS
5.0000 mg | ORAL_TABLET | Freq: Every day | ORAL | 0 refills | Status: DC
  Filled 2021-11-28: qty 90, 90d supply, fill #0

## 2021-11-28 MED ORDER — PREGABALIN 75 MG PO CAPS
75.0000 mg | ORAL_CAPSULE | Freq: Every day | ORAL | 3 refills | Status: DC
  Filled 2021-11-28 – 2021-12-06 (×2): qty 90, 90d supply, fill #0
  Filled 2022-02-27 – 2022-03-06 (×2): qty 90, 90d supply, fill #1
  Filled 2022-05-24 – 2022-06-08 (×2): qty 90, 90d supply, fill #2
  Filled 2022-09-04: qty 90, 90d supply, fill #3

## 2021-12-06 ENCOUNTER — Other Ambulatory Visit: Payer: Self-pay

## 2021-12-19 ENCOUNTER — Other Ambulatory Visit: Payer: Self-pay

## 2021-12-19 ENCOUNTER — Other Ambulatory Visit: Payer: Self-pay | Admitting: Internal Medicine

## 2021-12-20 ENCOUNTER — Other Ambulatory Visit: Payer: Self-pay

## 2021-12-20 MED ORDER — LOSARTAN POTASSIUM 50 MG PO TABS
ORAL_TABLET | Freq: Every day | ORAL | 1 refills | Status: DC
  Filled 2021-12-20: qty 90, 90d supply, fill #0
  Filled 2022-03-16: qty 90, 90d supply, fill #1

## 2021-12-20 NOTE — Telephone Encounter (Signed)
Requested Prescriptions  Pending Prescriptions Disp Refills   losartan (COZAAR) 50 MG tablet 90 tablet 1    Sig: TAKE 1 TABLET (50 MG TOTAL) BY MOUTH DAILY.     Cardiovascular:  Angiotensin Receptor Blockers Passed - 12/19/2021  8:37 AM      Passed - Cr in normal range and within 180 days    Creatinine  Date Value Ref Range Status  02/18/2013 0.8 0.6 - 1.1 mg/dL Final   Creat  Date Value Ref Range Status  11/22/2021 0.61 0.50 - 1.03 mg/dL Final   Creatinine,U  Date Value Ref Range Status  06/07/2021 51.9 mg/dL Final         Passed - K in normal range and within 180 days    Potassium  Date Value Ref Range Status  11/22/2021 4.2 3.5 - 5.3 mmol/L Final  02/18/2013 4.4 3.5 - 5.1 mEq/L Final         Passed - Patient is not pregnant      Passed - Last BP in normal range    BP Readings from Last 1 Encounters:  11/22/21 113/72         Passed - Valid encounter within last 6 months    Recent Outpatient Visits           4 weeks ago Coronary artery disease of native artery of native heart with stable angina pectoris [facility])   [facility] Jearld Fenton, NP   10 months ago Encounter for general adult medical examination with abnormal findings   [facility], Coralie Keens, NP       Future Appointments             In 3 weeks End, Harrell Gave, MD [place]. [place]

## 2021-12-29 ENCOUNTER — Encounter: Payer: Self-pay | Admitting: Endocrinology

## 2021-12-29 DIAGNOSIS — E1165 Type 2 diabetes mellitus with hyperglycemia: Secondary | ICD-10-CM

## 2021-12-30 ENCOUNTER — Other Ambulatory Visit: Payer: Self-pay

## 2021-12-30 MED ORDER — INSULIN GLARGINE-YFGN 100 UNIT/ML ~~LOC~~ SOPN
45.0000 [IU] | PEN_INJECTOR | SUBCUTANEOUS | 3 refills | Status: DC
  Filled 2021-12-30: qty 45, 90d supply, fill #0
  Filled 2022-03-25: qty 45, 90d supply, fill #1
  Filled 2022-07-03: qty 45, 90d supply, fill #2
  Filled 2022-10-02: qty 45, 90d supply, fill #3

## 2022-01-11 ENCOUNTER — Ambulatory Visit: Payer: No Typology Code available for payment source | Admitting: Internal Medicine

## 2022-01-19 ENCOUNTER — Other Ambulatory Visit: Payer: Self-pay

## 2022-02-06 ENCOUNTER — Other Ambulatory Visit: Payer: Self-pay

## 2022-02-15 ENCOUNTER — Ambulatory Visit: Payer: 59 | Admitting: Podiatry

## 2022-02-15 ENCOUNTER — Encounter: Payer: Self-pay | Admitting: Podiatry

## 2022-02-15 DIAGNOSIS — E1142 Type 2 diabetes mellitus with diabetic polyneuropathy: Secondary | ICD-10-CM

## 2022-02-15 NOTE — Progress Notes (Signed)
She presents today for diabetic foot exam states that her [name] was at 6.4.  Objective: Vital signs stable alert oriented x 3 there is no erythema edema cellulitis drainage or odor neurologic sensorium is intact deep tendon reflexes are intact muscle strength is normal symmetrical.  Vascular valuation is intact.  Capillary fill time is immediate.  Assessment: Diabetes mellitus with some neuropathy.  But currently noncomplicated.  Plan: We did discuss diabetic education will follow-up with me on an as-needed basis or in 6 months

## 2022-02-19 ENCOUNTER — Other Ambulatory Visit: Payer: Self-pay

## 2022-02-19 ENCOUNTER — Other Ambulatory Visit: Payer: Self-pay | Admitting: Internal Medicine

## 2022-02-19 ENCOUNTER — Other Ambulatory Visit: Payer: Self-pay | Admitting: Endocrinology

## 2022-02-19 DIAGNOSIS — I25118 Atherosclerotic heart disease of native coronary artery with other forms of angina pectoris: Secondary | ICD-10-CM

## 2022-02-20 ENCOUNTER — Other Ambulatory Visit: Payer: Self-pay | Admitting: Internal Medicine

## 2022-02-20 ENCOUNTER — Other Ambulatory Visit: Payer: Self-pay

## 2022-02-20 ENCOUNTER — Other Ambulatory Visit: Payer: Self-pay | Admitting: Endocrinology

## 2022-02-20 DIAGNOSIS — I25118 Atherosclerotic heart disease of native coronary artery with other forms of angina pectoris: Secondary | ICD-10-CM

## 2022-02-20 MED ORDER — FREESTYLE LIBRE 3 SENSOR MISC
1.0000 | 2 refills | Status: DC
  Filled 2022-02-20 – 2022-04-06 (×5): qty 2, 28d supply, fill #0
  Filled 2022-05-10: qty 2, 28d supply, fill #1
  Filled 2022-06-08: qty 2, 28d supply, fill #2

## 2022-02-20 NOTE — Telephone Encounter (Signed)
Needs office visit for further refills. Thank you!

## 2022-02-21 ENCOUNTER — Other Ambulatory Visit: Payer: Self-pay | Admitting: Endocrinology

## 2022-02-21 ENCOUNTER — Other Ambulatory Visit: Payer: Self-pay

## 2022-02-21 MED ORDER — OZEMPIC (2 MG/DOSE) 8 MG/3ML ~~LOC~~ SOPN
2.0000 mg | PEN_INJECTOR | SUBCUTANEOUS | 0 refills | Status: DC
  Filled 2022-02-21: qty 3, 28d supply, fill #0
  Filled 2022-03-21: qty 3, 28d supply, fill #1
  Filled 2022-04-23: qty 3, 28d supply, fill #2

## 2022-02-22 ENCOUNTER — Other Ambulatory Visit: Payer: Self-pay

## 2022-02-24 ENCOUNTER — Other Ambulatory Visit: Payer: Self-pay

## 2022-02-27 ENCOUNTER — Other Ambulatory Visit: Payer: Self-pay

## 2022-02-27 ENCOUNTER — Other Ambulatory Visit: Payer: Self-pay | Admitting: Endocrinology

## 2022-02-27 ENCOUNTER — Encounter: Payer: Self-pay | Admitting: Endocrinology

## 2022-02-27 MED ORDER — DAPAGLIFLOZIN PROPANEDIOL 5 MG PO TABS
5.0000 mg | ORAL_TABLET | Freq: Every day | ORAL | 0 refills | Status: DC
  Filled 2022-02-27: qty 90, 90d supply, fill #0

## 2022-03-01 ENCOUNTER — Other Ambulatory Visit: Payer: Self-pay

## 2022-03-06 ENCOUNTER — Other Ambulatory Visit: Payer: Self-pay

## 2022-03-14 ENCOUNTER — Other Ambulatory Visit: Payer: Self-pay

## 2022-03-16 ENCOUNTER — Other Ambulatory Visit: Payer: Self-pay | Admitting: Internal Medicine

## 2022-03-16 ENCOUNTER — Encounter: Payer: Self-pay | Admitting: Internal Medicine

## 2022-03-16 ENCOUNTER — Other Ambulatory Visit: Payer: Self-pay | Admitting: Endocrinology

## 2022-03-16 ENCOUNTER — Other Ambulatory Visit: Payer: Self-pay

## 2022-03-16 DIAGNOSIS — E1142 Type 2 diabetes mellitus with diabetic polyneuropathy: Secondary | ICD-10-CM

## 2022-03-16 DIAGNOSIS — I25118 Atherosclerotic heart disease of native coronary artery with other forms of angina pectoris: Secondary | ICD-10-CM

## 2022-03-16 MED ORDER — METOPROLOL TARTRATE 100 MG PO TABS
100.0000 mg | ORAL_TABLET | Freq: Two times a day (BID) | ORAL | 2 refills | Status: DC
  Filled 2022-03-16: qty 180, 90d supply, fill #0
  Filled 2022-06-12: qty 180, 90d supply, fill #1
  Filled 2022-10-02: qty 180, 90d supply, fill #2

## 2022-03-16 MED FILL — Glucose Blood Test Strip: 25 days supply | Qty: 50 | Fill #0 | Status: AC

## 2022-03-17 ENCOUNTER — Other Ambulatory Visit: Payer: Self-pay

## 2022-03-21 ENCOUNTER — Ambulatory Visit: Payer: 59 | Admitting: Endocrinology

## 2022-03-21 ENCOUNTER — Encounter: Payer: Self-pay | Admitting: Endocrinology

## 2022-03-21 VITALS — BP 128/82 | HR 96 | Ht 64.0 in | Wt 204.2 lb

## 2022-03-21 DIAGNOSIS — I10 Essential (primary) hypertension: Secondary | ICD-10-CM

## 2022-03-21 DIAGNOSIS — Z794 Long term (current) use of insulin: Secondary | ICD-10-CM

## 2022-03-21 DIAGNOSIS — E1165 Type 2 diabetes mellitus with hyperglycemia: Secondary | ICD-10-CM | POA: Diagnosis not present

## 2022-03-21 LAB — POCT GLYCOSYLATED HEMOGLOBIN (HGB A1C): Hemoglobin A1C: 6.5 % — AB (ref 4.0–5.6)

## 2022-03-21 LAB — POCT GLUCOSE (DEVICE FOR HOME USE): POC Glucose: 143 mg/dl — AB (ref 70–99)

## 2022-03-21 NOTE — Progress Notes (Signed)
Patient ID: Stacey Roberts, female   DOB: [birth year], 57 y.o.   MRN: [id number]            Reason for Appointment: Type II Diabetes follow-up   History of Present Illness   Diagnosis date: 2008  Previous history: She has been on insulin since 2017 Previous A1c range = 7.3-10.4 She was started on Ozempic in 2021 and this has been increased periodically and her last dose of 2 mg was started in 2/23  Recent history:     Non-insulin hypoglycemic drugs: Ozempic '2mg'$  weekly, metformin 2 g, Farxiga 5 mg daily     Insulin regimen: 45 units bioequivalent glargine in the morning daily           Side effects from medications: Yeast infection from 10 mg Farxiga, nausea from Trulicity  Current self management, blood sugar patterns and problems identified:  Her A1c is further improved at 6.5 compared to 7.1  Has been unable to get her freestyle libre sensor for about a month because of insurance issues She is only checking some fasting readings which are reportedly around 100 in the mornings  However, go on her sensor she was having occasional readings below 70  Today glucose after drinking coffee was 143 in the office She is taking 45 units of her insulin and is now taking it after she wakes up at 5 AM Has been able to take Ozempic regularly recently without side effects or difficulty with refills She says she has long work hours and cannot find time to walk Unclear what her readings are after meals Weight is down 3 pounds   Exercise: Just starting some weight and toning exercises  Diet management: She is eating very small portions but not always appearing to be getting protein and sometimes may just eat an English muffin or an apple for breakfast or snack Otherwise she  get some protein      Interpretation of her freestyle libre version 3 download previously:  Blood sugars are highest around midday and lowest before dinner  She has blood sugars in the overnight Timespan in the low  100 range, as low as 120 average but usually start rising after about 6-7 AM Overall variability is minimal HYPERGLYCEMIA is occurring primarily in the morning hours before noon POSTPRANDIAL hyperglycemia is generally not pronounced Blood sugars are rising modestly after breakfast but also has high Premeal readings at breakfast time around 9 AM Blood sugars after dinner are level with Premeal readings and blood sugars are stable after lunch also No hypoglycemia seen with rarely low normal blood sugars between 11 PM-3 AM  CGM use % of time 95  2-week average/GV 136/19  Time in range 94       %  % Time Above 180 6  % Time above 250   % Time Below 70      PRE-MEAL Fasting Lunch Dinner Bedtime Overall  Glucose range:       Averages: 136       POST-MEAL PC Breakfast PC Lunch PC Dinner  Glucose range:     Averages: 170 157 128     Hypoglycemia:  none                        Dietician visit: Most recent: 2011 with classes  Weight control:  Wt Readings from Last 3 Encounters:  03/21/22 204 lb 3.2 oz (92.6 kg)  11/22/21 207 lb (93.9 kg)  11/17/21 207 lb (  93.9 kg)             Diabetes labs:  Lab Results  Component Value Date   HGBA1C 6.5 (A) 03/21/2022   HGBA1C 6.5 (A) 11/17/2021   HGBA1C 7.1 (A) 06/07/2021   Lab Results  Component Value Date   MICROALBUR 0.9 06/07/2021   LDLCALC 55 11/22/2021   CREATININE 0.61 11/22/2021     Allergies as of 03/21/2022       Reactions   Ace Inhibitors Cough   REACTION: dry cough   Penicillins Shortness Of Breath, Rash   Pt has tolerated Cephalexin in 2017 and again in 2018.  Has patient had a PCN reaction causing immediate rash, facial/tongue/throat swelling, SOB or lightheadedness with hypotension: Yes Has patient had a PCN reaction causing severe rash involving mucus membranes or skin necrosis: Yes Has patient had a PCN reaction that required hospitalization No Has patient had a PCN reaction occurring within the last 10 years:  No If all of the above answers are "NO", then may proceed with Cephalosporin use.   Clindamycin/lincomycin Other (See Comments)   Severe stomach cramps   Ciprofloxacin Itching        Medication List        Accurate as of March 21, 2022  8:49 AM. If you have any questions, ask your nurse or doctor.          aspirin EC 81 MG tablet Take 1 tablet (81 mg total) by mouth daily.   Farxiga 5 MG Tabs tablet Generic drug: dapagliflozin propanediol TAKE 1 TABLET BY MOUTH DAILY.   FreeStyle Freedom Lite w/Device Kit test twice daily   freestyle lancets 1 each by Other route 2 (two) times daily. [name]   FreeStyle Libre 3 Sensor Misc Apply 1 sensor on upper arm every 14 days for continuous glucose monitoring (1 Device by Does not apply route every 14 (fourteen) days. Apply 1 sensor on upper arm every 14 days for continuous glucose monitoring)   FREESTYLE LITE test strip Generic drug: glucose blood Use 2 (two) times daily. (dx [name])   gabapentin 300 MG capsule Commonly known as: NEURONTIN Take 600 mg by mouth at bedtime as needed.   levothyroxine 112 MCG tablet Commonly known as: SYNTHROID Take 1 tablet (112 mcg total) by mouth daily.   losartan 50 MG tablet Commonly known as: COZAAR TAKE 1 TABLET (50 MG TOTAL) BY MOUTH DAILY.   metFORMIN 500 MG 24 hr tablet Commonly known as: GLUCOPHAGE-XR TAKE 4 TABLETS BY MOUTH DAILY WITH BREAKFAST   metoprolol tartrate 100 MG tablet Commonly known as: LOPRESSOR Take 1 tablet (100 mg total) by mouth 2 (two) times daily.   multivitamin with minerals tablet Take 1 tablet by mouth daily. Woman's Gummies   nitroGLYCERIN 0.4 MG SL tablet Commonly known as: Nitrostat Place 1 tablet (0.4 mg total) under the tongue every 5 (five) minutes as needed for chest pain.   Ozempic (2 MG/DOSE) 8 MG/3ML Sopn Generic drug: Semaglutide (2 MG/DOSE) Inject 2 mg into the skin once a week.   pantoprazole 40 MG tablet Commonly known as:  PROTONIX TAKE 1 TABLET BY MOUTH DAILY.   pregabalin 75 MG capsule Commonly known as: LYRICA Take 1 capsule (75 mg total) by mouth daily.   rosuvastatin 20 MG tablet Commonly known as: CRESTOR Take 1 tablet (20 mg total) by mouth daily.   Semglee (yfgn) 100 UNIT/ML Pen Generic drug: insulin glargine-yfgn Inject 45 Units into the skin every morning.   Unifine Pentips 31G X  8 MM Misc Generic drug: Insulin Pen Needle use to inject twice a day        Allergies:  Allergies  Allergen Reactions   Ace Inhibitors Cough    REACTION: dry cough   Penicillins Shortness Of Breath and Rash    Pt has tolerated Cephalexin in 2017 and again in 2018.   Has patient had a PCN reaction causing immediate rash, facial/tongue/throat swelling, SOB or lightheadedness with hypotension: Yes Has patient had a PCN reaction causing severe rash involving mucus membranes or skin necrosis: Yes Has patient had a PCN reaction that required hospitalization No Has patient had a PCN reaction occurring within the last 10 years: No If all of the above answers are "NO", then may proceed with Cephalosporin use.    Clindamycin/Lincomycin Other (See Comments)    Severe stomach cramps   Ciprofloxacin Itching    Past Medical History:  Diagnosis Date   Allergy    seasonal   BRCA negative 2017   Breast cancer ([place]) 2014   Breast cancer ([facility]) 2017   Bronchitis    hx of   Coronary artery disease 06/2019   80% mid RCA stenosis - medical management   Diabetes mellitus, type II ([place])    Endometrial hyperplasia 2014   Mirena placed; Dr. 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 Buddy Duty    Past Surgical History:  Procedure Laterality Date   APPLICATION OF A-CELL OF EXTREMITY Left 09/29/2020   Procedure: placement of ACell;  Surgeon: Wallace Going, DO;  Location: [facility];  Service: Plastics;  Laterality: Left;   BREAST BIOPSY  2000   s/p   BREAST BIOPSY Left 01/22/2013   Procedure: RE-EXCISION LEFT BREAST DCIS;  Surgeon: Harl Bowie, MD;  Location: [facility];  Service: General;  Laterality: Left;   BREAST LUMPECTOMY Left 01/06/2013   Procedure: LUMPECTOMY;  Surgeon: Harl Bowie, MD;  Location: [facility];  Service: General;  Laterality: Left;   BREAST LUMPECTOMY Left 2017   BREAST LUMPECTOMY WITH NEEDLE LOCALIZATION AND AXILLARY SENTINEL LYMPH NODE BX Left 09/30/2015   Procedure: Left BREAST LUMPECTOMY WITH NEEDLE LOCALIZATION AND AXILLARY SENTINEL LYMPH NODE BX;  Surgeon: Coralie Keens, MD;  Location: [facility];  Service: General;  Laterality: Left;   BREAST RECONSTRUCTION Bilateral 10/12/2015   Procedure: BREAST oncoplastic RECONSTRUCTION;  Surgeon: Irene Limbo, MD;  Location: [facility];  Service: Plastics;  Laterality: Bilateral;   BREAST REDUCTION SURGERY Bilateral 10/12/2015   Procedure: MAMMARY REDUCTION  (BREAST);  Surgeon: Irene Limbo, MD;  Location: [facility];  Service: Plastics;  Laterality: Bilateral;   CORONARY ANGIOPLASTY     DILATION AND CURETTAGE OF UTERUS  2004   s/p   EXCISION OF BREAST LESION Left 10/12/2015   Procedure: Re-EXCISION  OF BREAST;  Surgeon: Coralie Keens, MD;  Location: [facility];  Service: General;  Laterality: Left;   INCISION AND DRAINAGE OF WOUND Left 09/29/2020   Procedure: Excision of back and left breast  wound;  Surgeon: Wallace Going, DO;  Location: [facility];  Service: Plastics;  Laterality: Left;  1 hour   LATISSIMUS FLAP TO BREAST Left 07/19/2020   Procedure: Left breast radiation necrosis excision with latissimus muscle flap;  Surgeon: Wallace Going, DO;  Location: [facility];  Service: Plastics;  Laterality: Left;  3 hours   LEFT HEART CATH AND CORONARY ANGIOGRAPHY N/A 06/27/2019   Procedure: LEFT HEART CATH AND CORONARY ANGIOGRAPHY;  Surgeon: Nelva Bush, MD;  Location: [facility];  Service: Cardiovascular;  Laterality: N/A;   REDUCTION MAMMAPLASTY Bilateral 09/2015   THYROIDECTOMY  02/2009   Dr Harlow Asa   TONSILLECTOMY  1982   TONSILLECTOMY      Family History  Problem Relation Age of Onset   CAD Mother 22       Died age 59 of CAD   Heart disease Mother        CABG x 2   Graves' disease Mother    Hypertension Mother    Thyroid disease Mother    Heart attack Mother 2   Dementia Father        deceased age 50 secondary to dementia   Bipolar disorder Father    Emphysema Father    Heart disease Father    COPD Father        smoker   Hypertension Father    Depression Father    Alcoholism Father    Other Sister 72       hysterectomy for fibroids   Heart disease Sister        CABG x 2   Heart attack Sister 78   Prostate cancer Brother        low grade; w/ surveillance   Thyroid nodules Brother 19   Heart disease Brother 29       CABG x 4   CAD Maternal Grandfather 33   Heart disease Maternal Grandfather    Heart attack Maternal Grandfather 41   Kidney cancer Cousin        maternal 1st cousin dx 26-47; former smoker   Fibroids Other        niece dx approx 27   Hypertension Other    Lung cancer Other        maternal great aunt (MGM's sister); not a smoker   Cancer Other        nephew dx neuroblastoma at 49.5 years old   Colon cancer Neg Hx    Colon polyps Neg Hx     Social History:  reports that she quit smoking about 14 years  ago. Her smoking use included cigarettes. She smoked an average of .5 packs per day. She has never used smokeless tobacco. She reports that she does not currently use alcohol. She reports that she does not use drugs.  Review of Systems:  THYROID cancer: This was diagnosed in 2011 and she had a 1.7 cm follicular variant of papillary thyroid carcinoma confined to the thyroid but showing capsular invasion and angiolymphatic invasion  Treated with total thyroidectomy in 2011 and followed with 103 mCi of I-131 Whole-body scan in 2012 was negative She did have a single enlarged right cervical lymph node that was stable in 2012 and 2013  THYROGLOBULIN levels have been consistently  undetectable  She has been since 5/23 on LEVOTHYROXINE 112 mcg daily which she takes regularly in the morning   Lab Results  Component Value Date   TSH 0.64 11/22/2021   TSH 0.71 08/11/2021   TSH 0.04 (L) 06/07/2021   FREET4 1.3 11/22/2021   FREET4 1.13 08/11/2021   FREET4 1.55 06/07/2021   Hypertension: She has been treated with losartan 50 mg with recent blood pressure readings as follows, also on Farxiga  BP Readings from Last 3 Encounters:  03/21/22 (!) 138/92  11/22/21 113/72  11/17/21 132/84    Lipids: She has been treated with CRESTOR 20 mg daily, also has known CAD No side effects listed as muscle aches LDL well controlled    Lab Results  Component Value Date   CHOL 136 11/22/2021   CHOL 117 02/04/2021   CHOL 121 02/03/2020   Lab Results  Component Value Date   HDL 54 11/22/2021   HDL 51 02/04/2021   HDL 51.80 02/03/2020   Lab Results  Component Value Date   LDLCALC 55 11/22/2021   LDLCALC 44 02/04/2021   LDLCALC 46 02/03/2020   Lab Results  Component Value Date   TRIG 204 (H) 11/22/2021   TRIG 136 02/04/2021   TRIG 115.0 02/03/2020   Lab Results  Component Value Date   CHOLHDL 2.5 11/22/2021   CHOLHDL 2.3 02/04/2021   CHOLHDL 2 02/03/2020   Lab Results  Component Value  Date   LDLDIRECT 153.0 09/01/2014   LDLDIRECT 157.0 02/03/2014   LDLDIRECT 141.7 11/28/2012   She is up-to-date with eye exams    Examination:   BP (!) 138/92 (BP Location: Left Arm, Patient Position: Sitting, Cuff Size: Normal)   Pulse 96   Ht '5\' 4"'$  (1.626 m)   Wt 204 lb 3.2 oz (92.6 kg)   LMP 06/24/2010 (Approximate)   SpO2 99%   BMI 35.05 kg/m   Body mass index is 35.05 kg/m.    ASSESSMENT/ PLAN:    Diabetes type 2 on glargine insulin, Ozempic 2 mg, Farxiga 5 mg and metformin:   Blood glucose control is now consistent with A1c 6.5  She has lost a little weight since her last visit Also Ozempic is likely to help be helping her Although unable to review her blood sugar patterns from lack of CGM monitoring her A1c indicates overall fairly good control including after meals Again discussed that with her history of dawn phenomenon she should take her morning insulin as soon as she wakes up  Recommendations:  Encouraged her to increase exercise both weightbearing and some aerobic She will start using the sensor with a sample Will try to get prior authorization done for her libre 3 Reduce insulin to 42 units for now May adjust this further based on her sensor readings   HYPERTENSION: Blood pressure high normal Discussed need to get blood pressure down to 120/80 target and to check regularly at home, may need higher dose of losartan  She will discuss screening bone density with her PCP  Patient Instructions  Lantus 42 units in am  Start exercise regimen   Elayne Snare 03/21/2022, 8:49 AM     Elayne Snare

## 2022-03-21 NOTE — Patient Instructions (Addendum)
Lantus 42 units in am  Increase exercise regimen  Check BP weekly

## 2022-03-24 ENCOUNTER — Other Ambulatory Visit (HOSPITAL_COMMUNITY): Payer: Self-pay

## 2022-03-24 ENCOUNTER — Telehealth: Payer: Self-pay | Admitting: Pharmacy Technician

## 2022-03-24 NOTE — Telephone Encounter (Signed)
Patient Advocate Encounter   Received notification that prior authorization for FreeStyle Libre 3 Sensor is required.   PA submitted on 03/24/2022 [place] 3 Sensor Status is pending       Lyndel Safe, [place] Patient Advocate Specialist [place] Patient Advocate Team Direct Number: [phone]  Fax: [phone]

## 2022-03-25 ENCOUNTER — Other Ambulatory Visit: Payer: Self-pay

## 2022-03-26 ENCOUNTER — Other Ambulatory Visit: Payer: Self-pay

## 2022-03-27 ENCOUNTER — Other Ambulatory Visit: Payer: Self-pay

## 2022-03-27 DIAGNOSIS — M9901 Segmental and somatic dysfunction of cervical region: Secondary | ICD-10-CM | POA: Diagnosis not present

## 2022-03-27 DIAGNOSIS — M9903 Segmental and somatic dysfunction of lumbar region: Secondary | ICD-10-CM | POA: Diagnosis not present

## 2022-03-27 DIAGNOSIS — M62830 Muscle spasm of back: Secondary | ICD-10-CM | POA: Diagnosis not present

## 2022-03-27 DIAGNOSIS — R519 Headache, unspecified: Secondary | ICD-10-CM | POA: Diagnosis not present

## 2022-03-27 NOTE — Telephone Encounter (Signed)
PA has been APPROVED from 03/25/2022-03/24/2023  Approval letter has been attached in patients documents.

## 2022-03-28 ENCOUNTER — Other Ambulatory Visit: Payer: Self-pay

## 2022-03-31 ENCOUNTER — Other Ambulatory Visit: Payer: Self-pay | Admitting: Endocrinology

## 2022-03-31 ENCOUNTER — Other Ambulatory Visit: Payer: Self-pay

## 2022-03-31 DIAGNOSIS — Z794 Long term (current) use of insulin: Secondary | ICD-10-CM

## 2022-03-31 MED FILL — Insulin Pen Needle 31 G X 8 MM (1/3" or 5/16"): 50 days supply | Qty: 100 | Fill #0 | Status: AC

## 2022-04-04 ENCOUNTER — Other Ambulatory Visit: Payer: Self-pay

## 2022-04-04 ENCOUNTER — Telehealth: Payer: Self-pay | Admitting: Internal Medicine

## 2022-04-04 NOTE — Telephone Encounter (Signed)
Initial Assessment Questions   1. BLOOD PRESSURE: "What is the blood pressure?" "Did you take at least two measurements 5 minutes apart?" Blood pressure first time 150/100 HR 100 and 5 minutes later 160/95 HR 94 2. ONSET: "When did you take your blood pressure?" Earlier this morning after taking medications 3. HOW: "How did you take your blood pressure?" (e.g., automatic home BP monitor, visiting nurse) automatic home BP monitor 4. HISTORY: "Do you have a history of high blood pressure?" Yes 5. MEDICINES: "Are you taking any medicines for blood pressure?" "Have you missed any doses recently?" "Yes I am taking: losartan (COZAAR) 50 MG tablet and metoprolol tartrate (LOPRESSOR) 100 MG tablet." "No I take them regularly" 6. OTHER SYMPTOMS: "Do you have any symptoms?" (e.g., blurred vision, chest pain, difficulty breathing, headache, weakness) "Yes I am having some slight chest pain and a little lightheadedness" 7. PREGNANCY: "Is there any chance you are pregnant?" "When was your last menstrual period?" N/A  Appointment made with provider 04/05/22, patient satisfied with response.  CALL BACK IF: * Weakness or numbness of the face, arm or leg on one side of the body occurs * Difficulty walking, difficulty talking, or severe headache occurs * Chest pain or difficulty breathing occurs * You become worse   Patient understood with read back

## 2022-04-04 NOTE — Telephone Encounter (Signed)
Pt c/o BP issue: STAT if pt c/o blurred vision, one-sided weakness or slurred speech  1. What are your last 5 BP readings? 160/100 hr 100  2. Are you having any other symptoms (ex. Dizziness, headache, blurred vision, passed out)? Lightheadedness  3. What is your BP issue?  no

## 2022-04-05 ENCOUNTER — Encounter: Payer: Self-pay | Admitting: Internal Medicine

## 2022-04-05 ENCOUNTER — Ambulatory Visit: Payer: 59 | Attending: Internal Medicine | Admitting: Internal Medicine

## 2022-04-05 ENCOUNTER — Other Ambulatory Visit: Payer: Self-pay

## 2022-04-05 ENCOUNTER — Other Ambulatory Visit
Admission: RE | Admit: 2022-04-05 | Discharge: 2022-04-05 | Disposition: A | Discharge status: Home / Self Care | Payer: 59 | Source: Ambulatory Visit | Attending: Internal Medicine | Admitting: Internal Medicine

## 2022-04-05 VITALS — BP 120/70 | HR 106 | Ht 64.0 in | Wt 204.0 lb

## 2022-04-05 DIAGNOSIS — I25110 Atherosclerotic heart disease of native coronary artery with unstable angina pectoris: Secondary | ICD-10-CM | POA: Diagnosis not present

## 2022-04-05 DIAGNOSIS — R000 Tachycardia, unspecified: Secondary | ICD-10-CM | POA: Diagnosis not present

## 2022-04-05 DIAGNOSIS — E785 Hyperlipidemia, unspecified: Secondary | ICD-10-CM | POA: Diagnosis not present

## 2022-04-05 DIAGNOSIS — E1169 Type 2 diabetes mellitus with other specified complication: Secondary | ICD-10-CM

## 2022-04-05 DIAGNOSIS — E890 Postprocedural hypothyroidism: Secondary | ICD-10-CM

## 2022-04-05 DIAGNOSIS — E039 Hypothyroidism, unspecified: Secondary | ICD-10-CM | POA: Insufficient documentation

## 2022-04-05 LAB — CBC
HCT: 41.2 % (ref 36.0–46.0)
Hemoglobin: 13.1 g/dL (ref 12.0–15.0)
MCH: 27.3 pg (ref 26.0–34.0)
MCHC: 31.8 g/dL (ref 30.0–36.0)
MCV: 85.8 fL (ref 80.0–100.0)
Platelets: 317 10*3/uL (ref 150–400)
RBC: 4.8 MIL/uL (ref 3.87–5.11)
RDW: 14.3 % (ref 11.5–15.5)
WBC: 12.2 10*3/uL — ABNORMAL HIGH (ref 4.0–10.5)
nRBC: 0 % (ref 0.0–0.2)

## 2022-04-05 LAB — BASIC METABOLIC PANEL
Anion gap: 10 (ref 5–15)
BUN: 16 mg/dL (ref 6–20)
CO2: 26 mmol/L (ref 22–32)
Calcium: 9.5 mg/dL (ref 8.9–10.3)
Chloride: 104 mmol/L (ref 98–111)
Creatinine, Ser: 0.73 mg/dL (ref 0.44–1.00)
GFR, Estimated: >60 mL/min (ref >60)
Glucose, Bld: 138 mg/dL — ABNORMAL HIGH (ref 70–99)
Potassium: 4.6 mmol/L (ref 3.5–5.1)
Sodium: 140 mmol/L (ref 135–145)

## 2022-04-05 LAB — TSH: TSH: 1.3 u[IU]/mL (ref 0.350–4.500)

## 2022-04-05 MED ORDER — DILTIAZEM HCL ER COATED BEADS 120 MG PO CP24
120.0000 mg | ORAL_CAPSULE | Freq: Every day | ORAL | 3 refills | Status: DC
  Filled 2022-04-05: qty 90, 90d supply, fill #0
  Filled 2022-07-03: qty 90, 90d supply, fill #1
  Filled 2022-10-02: qty 90, 90d supply, fill #2
  Filled 2022-12-31: qty 90, 90d supply, fill #3

## 2022-04-05 NOTE — Patient Instructions (Addendum)
Medication Instructions:  Your physician recommends the following medication changes.  START TAKING: Diltiazem 120 mg by mouth daily   *If you need a refill on your cardiac medications before your next appointment, please call your pharmacy*   Lab Work: Your provider would like for you to have following labs drawn: (CBC, BMP, TSH).   Please go to the [facility] entrance and check in at the front desk.  You do not need an appointment.  They are open from 7am-6 pm.   If you have labs (blood work) drawn today and your tests are completely normal, you will receive your results only by: [place] (if you have MyChart) OR A paper copy in the mail If you have any lab test that is abnormal or we need to change your treatment, we will call you to review the results.   Testing/Procedures: Your physician has requested that you have a cardiac catheterization. Cardiac catheterization is used to diagnose and/or treat various heart conditions. Doctors may recommend this procedure for a number of different reasons. The most common reason is to evaluate chest pain. Chest pain can be a symptom of coronary artery disease (CAD), and cardiac catheterization can show whether plaque is narrowing or blocking your heart's arteries. This procedure is also used to evaluate the valves, as well as measure the blood flow and oxygen levels in different parts of your heart. For further information please visit [email or web address]. Please follow instruction sheet, as given. Please see instructions below   Follow-Up: At [facility], you and your health needs are our priority.  As part of our continuing mission to provide you with exceptional heart care, we have created designated Provider Care Teams.  These Care Teams include your primary Cardiologist (physician) and Advanced Practice Providers (APPs -  Physician Assistants and Nurse Practitioners) who all work together to provide you with the care  you need, when you need it.  We recommend signing up for the patient portal called "MyChart".  Sign up information is provided on this After Visit Summary.  MyChart is used to connect with patients for Virtual Visits (Telemedicine).  Patients are able to view lab/test results, encounter notes, upcoming appointments, etc.  Non-urgent messages can be sent to your provider as well.   To learn more about what you can do with MyChart, go to [email or web address].    Your next appointment:   2-3 week(s)  Provider:   You may see Nelva Bush, MD or one of the following Advanced Practice Providers on your designated Care Team:   Murray Hodgkins, NP Christell Faith, PA-C Cadence Kathlen Mody, PA-C Gerrie Nordmann, NP     [facility] [address] [place] [place] [address] Dept: [place]: [place]  04/05/2022  You are scheduled for a Cardiac Catheterization on Monday, March 18 with Dr. Harrell Gave End.  1. Please arrive at the [facility] (Main Entrance A) at [facility]: [place], [place] [address] at To be determined (This time is two hours before your procedure to ensure your preparation). Free valet parking service is available.   Special note: Every effort is made to have your procedure done on time. Please understand that emergencies sometimes delay scheduled procedures.  2. Diet: Do not eat solid foods after midnight.  The patient may have clear liquids until 5am upon the day of the procedure.  3.  Labs: You will need to have blood drawn today (CBC, BMP, TSH)  4. Medication instructions in preparation for your procedure:   Contrast Allergy: No  Hold Ozempic 7 days prior to procedure  Hold all diabetic medication morning of procedure Hold metformin morning of and 48 hours after procedure  On the morning of your procedure, take your  Aspirin 81 mg and any morning medicines NOT listed above.  You may use sips of water.  5. Plan for one night stay--bring personal belongings. 6. Bring a current list of your medications and current insurance cards. 7. You MUST have a responsible person to drive you home. 8. Someone MUST be with you the first 24 hours after you arrive home or your discharge will be delayed. 9. Please wear clothes that are easy to get on and off and wear slip-on shoes.  Thank you for allowing [place] to care for you!   -- [facility]

## 2022-04-05 NOTE — H&P (View-Only) (Signed)
 Follow-up Outpatient Visit Date: 04/05/2022  Primary Care Provider: Baity, Regina W, NP [address] [place] [address]  Chief Complaint: Chest pain  HPI:  Ms. Klinge is a 57 y.o. female with history of coronary artery disease with 80% mid RCA stenosis being managed medically, hypertension, hyperlipidemia, type 2 diabetes mellitus, prior tobacco use, and breast cancer , who presents for follow-up of Neri artery disease.  She was last seen in our office in 06/2021 by Cadence Furth, PA, at which time she was doing well without chest pain.  She noticed occasional palpitations that were were brief and self-limited.  No medication changes or additional testing were pursued.  She reached out to our office yesterday complaining of slight chest pain, lightheadedness, and elevated blood pressure.  Ms. Sutphin reports that she was feeling fairly well yesterday morning until she received a notification from her supervisor at work that 5 coworkers had called out.  She suddenly felt anxious and began having a dull pain in the left side of her chest it persisted for several hours and initially was associated with transient lightheadedness and labored breathing.  Though the lightheadedness and dyspnea resolved quite quickly, the pain persisted for hours.  She ultimately went and took a hot shower to help her relax.  She felt a little bit better but continues to have the dull pain until it spontaneously resolved in the late afternoon.  The pain was not worsened by exertion or other activities.  There were no recent medication changes.  She did not try taking her nitroglycerin.  She endorses forgetting to take her afternoon metoprolol dose several times a week but almost never misses the morning dose.  She has not had any palpitations or edema.  --------------------------------------------------------------------------------------------------  Past Medical History:  Diagnosis Date   Allergy    seasonal   BRCA  negative 2017   Breast cancer (HCC) 2014   Breast cancer (HCC) 2017   Bronchitis    hx of   Coronary artery disease 06/2019   80% mid RCA stenosis - medical management   Diabetes mellitus, type II (HCC)    Endometrial hyperplasia 2014   Mirena placed; Dr. 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 Kerr   Past Surgical History:  Procedure Laterality Date   APPLICATION OF A-CELL OF EXTREMITY Left 09/29/2020   Procedure: placement of ACell;  Surgeon: Dillingham, Claire S, DO;  Location: [facility];  Service: Plastics;  Laterality: Left;   BREAST BIOPSY  2000   s/p   BREAST BIOPSY Left 01/22/2013   Procedure: RE-EXCISION LEFT BREAST DCIS;  Surgeon: Douglas A Blackman, MD;  Location: [facility];  Service: General;  Laterality: Left;   BREAST LUMPECTOMY Left 01/06/2013   Procedure: LUMPECTOMY;  Surgeon: Douglas A Blackman, MD;    Location: [facility];  Service: General;  Laterality: Left;   BREAST LUMPECTOMY Left 2017   BREAST LUMPECTOMY WITH NEEDLE LOCALIZATION AND AXILLARY SENTINEL LYMPH NODE BX Left 09/30/2015   Procedure: Left BREAST LUMPECTOMY WITH NEEDLE LOCALIZATION AND AXILLARY SENTINEL LYMPH NODE BX;  Surgeon: Douglas Blackman, MD;  Location: [facility];  Service: General;  Laterality: Left;   BREAST RECONSTRUCTION Bilateral 10/12/2015   Procedure: BREAST oncoplastic RECONSTRUCTION;  Surgeon: Brinda Thimmappa,  MD;  Location: [facility];  Service: Plastics;  Laterality: Bilateral;   BREAST REDUCTION SURGERY Bilateral 10/12/2015   Procedure: MAMMARY REDUCTION  (BREAST);  Surgeon: Brinda Thimmappa, MD;  Location: [facility];  Service: Plastics;  Laterality: Bilateral;   CORONARY ANGIOPLASTY     DILATION AND CURETTAGE OF UTERUS  2004   s/p   EXCISION OF BREAST LESION Left 10/12/2015   Procedure: Re-EXCISION OF BREAST;  Surgeon: Douglas Blackman, MD;  Location: [facility];  Service: General;  Laterality: Left;   INCISION AND DRAINAGE OF WOUND Left 09/29/2020   Procedure: Excision of back and left breast wound;  Surgeon: Dillingham, Claire S, DO;  Location: [facility];  Service: Plastics;  Laterality: Left;  1 hour   LATISSIMUS FLAP TO BREAST Left 07/19/2020   Procedure: Left breast radiation necrosis excision with latissimus muscle flap;  Surgeon: Dillingham, Claire S, DO;  Location: [facility];  Service: Plastics;  Laterality: Left;  3 hours   LEFT HEART CATH AND CORONARY ANGIOGRAPHY N/A 06/27/2019   Procedure: LEFT HEART CATH AND CORONARY ANGIOGRAPHY;  Surgeon: Jemina Scahill, MD;  Location: [facility];  Service: Cardiovascular;  Laterality: N/A;   REDUCTION MAMMAPLASTY Bilateral 09/2015   THYROIDECTOMY  02/2009   Dr Gerkin   TONSILLECTOMY  1982   TONSILLECTOMY      Current Meds  Medication Sig   aspirin EC 81 MG tablet Take 1 tablet (81 mg total) by mouth daily.   Blood Glucose Monitoring Suppl (FREESTYLE FREEDOM LITE) w/Device KIT test twice daily   Continuous Blood Gluc Sensor (FREESTYLE LIBRE 3 SENSOR) MISC 1 Device by Does not apply route every 14 (fourteen) days. Apply 1 sensor on upper arm every 14 days for continuous glucose monitoring   dapagliflozin propanediol (FARXIGA) 5 MG TABS tablet TAKE 1 TABLET BY MOUTH DAILY.   gabapentin (NEURONTIN) 300 MG capsule Take 600 mg by mouth at bedtime as needed.   glucose blood (FREESTYLE  LITE) test strip Use 2 (two) times daily. (dx [name])   insulin glargine-yfgn (SEMGLEE) 100 UNIT/ML Pen Inject 45 Units into the skin every morning.   Insulin Pen Needle (UNIFINE PENTIPS) 31G X 8 MM MISC use to inject twice a day   Lancets (FREESTYLE) lancets 1 each by Other route 2 (two) times daily. [name]   levothyroxine (SYNTHROID) 112 MCG tablet Take 1 tablet (112 mcg total) by mouth daily.   losartan (COZAAR) 50 MG tablet TAKE 1 TABLET (50 MG TOTAL) BY MOUTH DAILY.   metFORMIN (GLUCOPHAGE-XR) 500 MG 24 hr tablet TAKE 4 TABLETS BY MOUTH DAILY WITH BREAKFAST   metoprolol tartrate (LOPRESSOR) 100 MG tablet Take 1 tablet (100 mg total) by mouth 2 (two) times daily.   Multiple Vitamins-Minerals (MULTIVITAMIN WITH MINERALS) tablet Take 1 tablet by mouth daily. Woman's Gummies   nitroGLYCERIN (NITROSTAT) 0.4 MG SL tablet Place 1 tablet (0.4 mg total) under the tongue every 5 (five) minutes as needed for chest pain.   pantoprazole (PROTONIX) 40 MG tablet TAKE 1 TABLET   BY MOUTH DAILY.   pregabalin (LYRICA) 75 MG capsule Take 1 capsule (75 mg total) by mouth daily.   rosuvastatin (CRESTOR) 20 MG tablet Take 1 tablet (20 mg total) by mouth daily.   Semaglutide, 2 MG/DOSE, (OZEMPIC, 2 MG/DOSE,) 8 MG/3ML SOPN Inject 2 mg into the skin once a week.    Allergies: Ace inhibitors, Penicillins, Clindamycin/lincomycin, and Ciprofloxacin  Social History   Tobacco Use   Smoking status: Former    Packs/day: 0.50    Types: Cigarettes    Quit date: 09/29/2007    Years since quitting: 14.5   Smokeless tobacco: Never   Tobacco comments:    Smoked on and off for 20 years. Quit about 8 years ago (as of 08/2015)  Vaping Use   Vaping Use: Never used  Substance Use Topics   Alcohol use: Not Currently    Alcohol/week: 0.0 standard drinks of alcohol   Drug use: No    Family History  Problem Relation Age of Onset   CAD Mother 47       Died age 67 of CAD   Heart disease Mother        CABG x 2   Graves'  disease Mother    Hypertension Mother    Thyroid disease Mother    Heart attack Mother 43   Dementia Father        deceased age 69 secondary to dementia   Bipolar disorder Father    Emphysema Father    Heart disease Father    COPD Father        smoker   Hypertension Father    Depression Father    Alcoholism Father    Other Sister 42       hysterectomy for fibroids   Heart disease Sister        CABG x 2   Heart attack Sister 62   Prostate cancer Brother        low grade; w/ surveillance   Thyroid nodules Brother 59   Heart disease Brother 63       CABG x 4   CAD Maternal Grandfather 60   Heart disease Maternal Grandfather    Heart attack Maternal Grandfather 64   Kidney cancer Cousin        maternal 1st cousin dx 46-47; former smoker   Fibroids Other        niece dx approx 36   Hypertension Other    Lung cancer Other        maternal great aunt (MGM's sister); not a smoker   Cancer Other        nephew dx neuroblastoma at 2.5 years old   Colon cancer Neg Hx    Colon polyps Neg Hx     Review of Systems: A 12-system review of systems was performed and was negative except as noted in the HPI.  --------------------------------------------------------------------------------------------------  Physical Exam: BP 120/70 (BP Location: Left Arm, Patient Position: Sitting, Cuff Size: Large)   Pulse (!) 106   Ht 5' 4" (1.626 m)   Wt 204 lb (92.5 kg)   LMP 06/24/2010 (Approximate)   SpO2 98%   BMI 35.02 kg/m   General:  NAD. Neck: No JVD or HJR. Lungs: Clear to auscultation bilaterally without wheezes or crackles. Heart: Tachycardic but regular without murmurs. Abdomen: Soft, nontender, nondistended. Extremities: No lower extremity edema.  EKG: Sinus tachycardia with incomplete right bundle branch block.  Compared with prior tracing from 07/08/2021, heart rate has increased and incomplete right   bundle branch block is now present.  Lab Results  Component Value Date    WBC 10.5 11/22/2021   HGB 12.6 11/22/2021   HCT 37.9 11/22/2021   MCV 85.4 11/22/2021   PLT 310 11/22/2021    Lab Results  Component Value Date   NA 139 11/22/2021   K 4.2 11/22/2021   CL 102 11/22/2021   CO2 25 11/22/2021   BUN 15 11/22/2021   CREATININE 0.61 11/22/2021   GLUCOSE 93 11/22/2021   ALT 10 11/22/2021    Lab Results  Component Value Date   CHOL 136 11/22/2021   HDL 54 11/22/2021   LDLCALC 55 11/22/2021   LDLDIRECT 153.0 09/01/2014   TRIG 204 (H) 11/22/2021   CHOLHDL 2.5 11/22/2021    --------------------------------------------------------------------------------------------------  ASSESSMENT AND PLAN: Coronary artery disease with unstable angina: I am concerned that Ms. Balbuena's episode of chest pain yesterday could represent progression of her CAD.  Fortunately, she is not having any further pain today.  We have discussed medical therapy versus further ischemia evaluation.  Given that she has known CT FFR positive disease involving the RCA, I do not think it would be worthwhile to repeat noninvasive ischemia testing.  We have therefore agreed to proceed with cardiac catheterization and possible PCI.  Will be scheduled to be done with me on Monday at [facility], as lithotripsy or atherectomy may be necessary given severe coronary calcification.  In the meantime, we will continue metoprolol tartrate 100 mg twice daily and add diltiazem 120 mg daily.  I have asked Ms. Bozman to continue taking aspirin and rosuvastatin and to seek immediate medical attention where she to have any recurrence of her chest pain.  Sinus tachycardia and hypothyroidism: Heart rate slightly higher than normal today with new incomplete right bundle branch block.  No risk factors for PE noted.  We will continue metoprolol tartrate 100 mg twice daily (I encouraged her to try to take her evening dose more regularly) and also add diltiazem 120 mg daily.  Check CBC, BMP, and TSH  today.  Hyperlipidemia associated with type 2 diabetes mellitus: LDL well-controlled on last check in 10/2021.  Triglycerides mildly elevated at that time.  Continue rosuvastatin 20 mg daily.  Ongoing management of DM per PCP.  Shared Decision Making/Informed Consent The risks [stroke (1 in 1000), death (1 in 1000), kidney failure [usually temporary] (1 in 500), bleeding (1 in 200), allergic reaction [possibly serious] (1 in 200)], benefits (diagnostic support and management of coronary artery disease) and alternatives of a cardiac catheterization were discussed in detail with Ms. Sizemore and she is willing to proceed.  Follow-up: Return to clinic in 3-4 weeks.  Octavion Mollenkopf, MD 04/05/2022 4:42 PM  

## 2022-04-05 NOTE — Progress Notes (Signed)
Follow-up Outpatient Visit Date: 04/05/2022  Primary Care Provider: Jearld Fenton, NP [place] [address]  Chief Complaint: Chest pain  HPI:  Stacey Roberts is a 57 y.o. female with history of coronary artery disease with 80% mid RCA stenosis being managed medically, hypertension, hyperlipidemia, type 2 diabetes mellitus, prior tobacco use, and breast cancer , who presents for follow-up of [place] artery disease.  She was last seen in our office in 06/2021 by Cadence Furth, PA, at which time she was doing well without chest pain.  She noticed occasional palpitations that were were brief and self-limited.  No medication changes or additional testing were pursued.  She reached out to our office yesterday complaining of slight chest pain, lightheadedness, and elevated blood pressure.  Stacey Roberts reports that she was feeling fairly well yesterday morning until she received a notification from her supervisor at work that 5 coworkers had called out.  She suddenly felt anxious and began having a dull pain in the left side of her chest it persisted for several hours and initially was associated with transient lightheadedness and labored breathing.  Though the lightheadedness and dyspnea resolved quite quickly, the pain persisted for hours.  She ultimately went and took a hot shower to help her relax.  She felt a little bit better but continues to have the dull pain until it spontaneously resolved in the late afternoon.  The pain was not worsened by exertion or other activities.  There were no recent medication changes.  She did not try taking her nitroglycerin.  She endorses forgetting to take her afternoon metoprolol dose several times a week but almost never misses the morning dose.  She has not had any palpitations or edema.  --------------------------------------------------------------------------------------------------  Past Medical History:  Diagnosis Date   Allergy    seasonal   BRCA  negative 2017   Breast cancer ([place]) 2014   Breast cancer ([place]) 2017   Bronchitis    hx of   Coronary artery disease 06/2019   80% mid RCA stenosis - medical management   Diabetes mellitus, type II ([place])    Endometrial hyperplasia 2014   Mirena placed; Dr. 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 Buddy Duty   Past Surgical History:  Procedure Laterality Date   APPLICATION OF A-CELL OF EXTREMITY Left 09/29/2020   Procedure: placement of ACell;  Surgeon: Wallace Going, DO;  Location: [facility];  Service: Plastics;  Laterality: Left;   BREAST BIOPSY  2000   s/p   BREAST BIOPSY Left 01/22/2013   Procedure: RE-EXCISION LEFT BREAST DCIS;  Surgeon: Harl Bowie, MD;  Location: [facility];  Service: General;  Laterality: Left;   BREAST LUMPECTOMY Left 01/06/2013   Procedure: LUMPECTOMY;  Surgeon: Harl Bowie, MD;  [facility];  Service: General;  Laterality: Left;   BREAST LUMPECTOMY Left 2017   BREAST LUMPECTOMY WITH NEEDLE LOCALIZATION AND AXILLARY SENTINEL LYMPH NODE BX Left 09/30/2015   Procedure: Left BREAST LUMPECTOMY WITH NEEDLE LOCALIZATION AND AXILLARY SENTINEL LYMPH NODE BX;  Surgeon: Coralie Keens, MD;  Location: [facility];  Service: General;  Laterality: Left;   BREAST RECONSTRUCTION Bilateral 10/12/2015   Procedure: BREAST oncoplastic RECONSTRUCTION;  Surgeon: Irene Limbo,  MD;  Location: [facility];  Service: Plastics;  Laterality: Bilateral;   BREAST REDUCTION SURGERY Bilateral 10/12/2015   Procedure: MAMMARY REDUCTION  (BREAST);  Surgeon: Irene Limbo, MD;  Location: [facility];  Service: Plastics;  Laterality: Bilateral;   CORONARY ANGIOPLASTY     DILATION AND CURETTAGE OF UTERUS  2004   s/p   EXCISION OF BREAST LESION Left 10/12/2015   Procedure: Re-EXCISION OF BREAST;  Surgeon: Coralie Keens, MD;  Location: [facility];  Service: General;  Laterality: Left;   INCISION AND DRAINAGE OF WOUND Left 09/29/2020   Procedure: Excision of back and left breast wound;  Surgeon: Wallace Going, DO;  Location: [facility];  Service: Plastics;  Laterality: Left;  1 hour   LATISSIMUS FLAP TO BREAST Left 07/19/2020   Procedure: Left breast radiation necrosis excision with latissimus muscle flap;  Surgeon: Wallace Going, DO;  Location: [facility];  Service: Plastics;  Laterality: Left;  3 hours   LEFT HEART CATH AND CORONARY ANGIOGRAPHY N/A 06/27/2019   Procedure: LEFT HEART CATH AND CORONARY ANGIOGRAPHY;  Surgeon: Nelva Bush, MD;  Location: [facility];  Service: Cardiovascular;  Laterality: N/A;   REDUCTION MAMMAPLASTY Bilateral 09/2015   THYROIDECTOMY  02/2009   Dr Harlow Asa   TONSILLECTOMY  1982   TONSILLECTOMY      Current Meds  Medication Sig   aspirin EC 81 MG tablet Take 1 tablet (81 mg total) by mouth daily.   Blood Glucose Monitoring Suppl (FREESTYLE FREEDOM LITE) w/Device KIT test twice daily   Continuous Blood Gluc Sensor (FREESTYLE LIBRE 3 SENSOR) MISC 1 Device by Does not apply route every 14 (fourteen) days. Apply 1 sensor on upper arm every 14 days for continuous glucose monitoring   dapagliflozin propanediol (FARXIGA) 5 MG TABS tablet TAKE 1 TABLET BY MOUTH DAILY.   gabapentin (NEURONTIN) 300 MG capsule Take 600 mg by mouth at bedtime as needed.   glucose blood (FREESTYLE  LITE) test strip Use 2 (two) times daily. (dx [name])   insulin glargine-yfgn (SEMGLEE) 100 UNIT/ML Pen Inject 45 Units into the skin every morning.   Insulin Pen Needle (UNIFINE PENTIPS) 31G X 8 MM MISC use to inject twice a day   Lancets (FREESTYLE) lancets 1 each by Other route 2 (two) times daily. [name]   levothyroxine (SYNTHROID) 112 MCG tablet Take 1 tablet (112 mcg total) by mouth daily.   losartan (COZAAR) 50 MG tablet TAKE 1 TABLET (50 MG TOTAL) BY MOUTH DAILY.   metFORMIN (GLUCOPHAGE-XR) 500 MG 24 hr tablet TAKE 4 TABLETS BY MOUTH DAILY WITH BREAKFAST   metoprolol tartrate (LOPRESSOR) 100 MG tablet Take 1 tablet (100 mg total) by mouth 2 (two) times daily.   Multiple Vitamins-Minerals (MULTIVITAMIN WITH MINERALS) tablet Take 1 tablet by mouth daily. Woman's Gummies   nitroGLYCERIN (NITROSTAT) 0.4 MG SL tablet Place 1 tablet (0.4 mg total) under the tongue every 5 (five) minutes as needed for chest pain.   pantoprazole (PROTONIX) 40 MG tablet TAKE 1 TABLET  BY MOUTH DAILY.   pregabalin (LYRICA) 75 MG capsule Take 1 capsule (75 mg total) by mouth daily.   rosuvastatin (CRESTOR) 20 MG tablet Take 1 tablet (20 mg total) by mouth daily.   Semaglutide, 2 MG/DOSE, (OZEMPIC, 2 MG/DOSE,) 8 MG/3ML SOPN Inject 2 mg into the skin once a week.    Allergies: Ace inhibitors, Penicillins, Clindamycin/lincomycin, and Ciprofloxacin  Social History   Tobacco Use   Smoking status: Former    Packs/day: 0.50    Types: Cigarettes    Quit date: 09/29/2007    Years since quitting: 14.5   Smokeless tobacco: Never   Tobacco comments:    Smoked on and off for 20 years. Quit about 8 years ago (as of 08/2015)  Vaping Use   Vaping Use: Never used  Substance Use Topics   Alcohol use: Not Currently    Alcohol/week: 0.0 standard drinks of alcohol   Drug use: No    Family History  Problem Relation Age of Onset   CAD Mother 67       Died age 48 of CAD   Heart disease Mother        CABG x 2   Graves'  disease Mother    Hypertension Mother    Thyroid disease Mother    Heart attack Mother 13   Dementia Father        deceased age 94 secondary to dementia   Bipolar disorder Father    Emphysema Father    Heart disease Father    COPD Father        smoker   Hypertension Father    Depression Father    Alcoholism Father    Other Sister 64       hysterectomy for fibroids   Heart disease Sister        CABG x 2   Heart attack Sister 76   Prostate cancer Brother        low grade; w/ surveillance   Thyroid nodules Brother 17   Heart disease Brother 29       CABG x 4   CAD Maternal Grandfather 34   Heart disease Maternal Grandfather    Heart attack Maternal Grandfather 45   Kidney cancer Cousin        maternal 1st cousin dx 5-47; former smoker   Fibroids Other        niece dx approx 64   Hypertension Other    Lung cancer Other        maternal great aunt (MGM's sister); not a smoker   Cancer Other        nephew dx neuroblastoma at 75.54 years old   Colon cancer Neg Hx    Colon polyps Neg Hx     Review of Systems: A 12-system review of systems was performed and was negative except as noted in the HPI.  --------------------------------------------------------------------------------------------------  Physical Exam: BP 120/70 (BP Location: Left Arm, Patient Position: Sitting, Cuff Size: Large)   Pulse (!) 106   Ht '5\' 4"'$  (1.626 m)   Wt 204 lb (92.5 kg)   LMP 06/24/2010 (Approximate)   SpO2 98%   BMI 35.02 kg/m   General:  NAD. Neck: No JVD or HJR. Lungs: Clear to auscultation bilaterally without wheezes or crackles. Heart: Tachycardic but regular without murmurs. Abdomen: Soft, nontender, nondistended. Extremities: No lower extremity edema.  EKG: Sinus tachycardia with incomplete right bundle branch block.  Compared with prior tracing from 07/08/2021, heart rate has increased and incomplete right  bundle branch block is now present.  Lab Results  Component Value Date    WBC 10.5 11/22/2021   HGB 12.6 11/22/2021   HCT 37.9 11/22/2021   MCV 85.4 11/22/2021   PLT 310 11/22/2021    Lab Results  Component Value Date   NA 139 11/22/2021   K 4.2 11/22/2021   CL 102 11/22/2021   CO2 25 11/22/2021   BUN 15 11/22/2021   CREATININE 0.61 11/22/2021   GLUCOSE 93 11/22/2021   ALT 10 11/22/2021    Lab Results  Component Value Date   CHOL 136 11/22/2021   HDL 54 11/22/2021   LDLCALC 55 11/22/2021   LDLDIRECT 153.0 09/01/2014   TRIG 204 (H) 11/22/2021   CHOLHDL 2.5 11/22/2021    --------------------------------------------------------------------------------------------------  ASSESSMENT AND PLAN: Coronary artery disease with unstable angina: I am concerned that Stacey Roberts's episode of chest pain yesterday could represent progression of her CAD.  Fortunately, she is not having any further pain today.  We have discussed medical therapy versus further ischemia evaluation.  Given that she has known CT FFR positive disease involving the RCA, I do not think it would be worthwhile to repeat noninvasive ischemia testing.  We have therefore agreed to proceed with cardiac catheterization and possible PCI.  Will be scheduled to be done with me on Monday at [facility], as lithotripsy or atherectomy may be necessary given severe coronary calcification.  In the meantime, we will continue metoprolol tartrate 100 mg twice daily and add diltiazem 120 mg daily.  I have asked Stacey Roberts to continue taking aspirin and rosuvastatin and to seek immediate medical attention where she to have any recurrence of her chest pain.  Sinus tachycardia and hypothyroidism: Heart rate slightly higher than normal today with new incomplete right bundle branch block.  No risk factors for PE noted.  We will continue metoprolol tartrate 100 mg twice daily (I encouraged her to try to take her evening dose more regularly) and also add diltiazem 120 mg daily.  Check CBC, BMP, and TSH  today.  Hyperlipidemia associated with type 2 diabetes mellitus: LDL well-controlled on last check in 10/2021.  Triglycerides mildly elevated at that time.  Continue rosuvastatin 20 mg daily.  Ongoing management of DM per PCP.  Shared Decision Making/Informed Consent The risks [stroke (1 in 1000), death (1 in 1000), kidney failure [usually temporary] (1 in 500), bleeding (1 in 200), allergic reaction [possibly serious] (1 in 200)], benefits (diagnostic support and management of coronary artery disease) and alternatives of a cardiac catheterization were discussed in detail with Stacey Roberts and she is willing to proceed.  Follow-up: Return to clinic in 3-4 weeks.  Nelva Bush, MD 04/05/2022 4:42 PM

## 2022-04-06 ENCOUNTER — Encounter: Payer: Self-pay | Admitting: Endocrinology

## 2022-04-06 ENCOUNTER — Telehealth: Payer: Self-pay | Admitting: *Deleted

## 2022-04-06 ENCOUNTER — Other Ambulatory Visit: Payer: Self-pay

## 2022-04-06 ENCOUNTER — Telehealth: Payer: Self-pay

## 2022-04-06 NOTE — Telephone Encounter (Signed)
Cardiac Catheterization scheduled at [facility] for: Monday April 10, 2022 7:30 AM Arrival time [place] En[facility] A at: 5:30 AM  Nothing to eat after midnight prior to procedure, clear liquids until 5 AM day of procedure.  Medication instructions: -Hold:  Metformin-day of procedure and 48 hours post procedure  Insulin-AM of procedure  Farxiga-AM of procedure  Ozempic-weekly on Sundays-will hold 04/09/22 dose. -Other usual morning medications can be taken with sips of water including aspirin 81 mg.  Confirmed patient has responsible adult to drive home post procedure and be with patient first 24 hours after arriving home.  Plan to go home the same day, you will only stay overnight if medically necessary.  Reviewed procedure instructions with patient.

## 2022-04-06 NOTE — Telephone Encounter (Signed)
Cath scheduled for Monday 04/10/22 @ 7:30 am Pt informed to arrive by 5:30 am

## 2022-04-10 ENCOUNTER — Other Ambulatory Visit (HOSPITAL_COMMUNITY): Payer: Self-pay

## 2022-04-10 ENCOUNTER — Other Ambulatory Visit: Payer: Self-pay

## 2022-04-10 ENCOUNTER — Ambulatory Visit (HOSPITAL_COMMUNITY)
Admission: RE | Admit: 2022-04-10 | Discharge: 2022-04-10 | Disposition: A | Discharge status: Home / Self Care | Payer: 59 | Source: Ambulatory Visit | Attending: Internal Medicine | Admitting: Internal Medicine

## 2022-04-10 ENCOUNTER — Encounter (HOSPITAL_COMMUNITY)
Admission: RE | Disposition: A | Discharge status: Home / Self Care | Payer: Self-pay | Source: Ambulatory Visit | Attending: Internal Medicine

## 2022-04-10 DIAGNOSIS — Z87891 Personal history of nicotine dependence: Secondary | ICD-10-CM | POA: Insufficient documentation

## 2022-04-10 DIAGNOSIS — I10 Essential (primary) hypertension: Secondary | ICD-10-CM | POA: Insufficient documentation

## 2022-04-10 DIAGNOSIS — E1169 Type 2 diabetes mellitus with other specified complication: Secondary | ICD-10-CM | POA: Insufficient documentation

## 2022-04-10 DIAGNOSIS — Z955 Presence of coronary angioplasty implant and graft: Secondary | ICD-10-CM

## 2022-04-10 DIAGNOSIS — I2584 Coronary atherosclerosis due to calcified coronary lesion: Secondary | ICD-10-CM | POA: Diagnosis not present

## 2022-04-10 DIAGNOSIS — Z79899 Other long term (current) drug therapy: Secondary | ICD-10-CM | POA: Insufficient documentation

## 2022-04-10 DIAGNOSIS — I25110 Atherosclerotic heart disease of native coronary artery with unstable angina pectoris: Secondary | ICD-10-CM | POA: Diagnosis not present

## 2022-04-10 DIAGNOSIS — Z8249 Family history of ischemic heart disease and other diseases of the circulatory system: Secondary | ICD-10-CM | POA: Diagnosis not present

## 2022-04-10 DIAGNOSIS — Z7982 Long term (current) use of aspirin: Secondary | ICD-10-CM | POA: Diagnosis not present

## 2022-04-10 DIAGNOSIS — Z7984 Long term (current) use of oral hypoglycemic drugs: Secondary | ICD-10-CM | POA: Insufficient documentation

## 2022-04-10 DIAGNOSIS — E785 Hyperlipidemia, unspecified: Secondary | ICD-10-CM | POA: Insufficient documentation

## 2022-04-10 DIAGNOSIS — Z794 Long term (current) use of insulin: Secondary | ICD-10-CM | POA: Insufficient documentation

## 2022-04-10 DIAGNOSIS — E039 Hypothyroidism, unspecified: Secondary | ICD-10-CM | POA: Diagnosis not present

## 2022-04-10 HISTORY — PX: CORONARY STENT INTERVENTION: CATH118234

## 2022-04-10 HISTORY — PX: LEFT HEART CATH AND CORONARY ANGIOGRAPHY: CATH118249

## 2022-04-10 LAB — GLUCOSE, CAPILLARY
Glucose-Capillary: 158 mg/dL — ABNORMAL HIGH (ref 70–99)
Glucose-Capillary: 231 mg/dL — ABNORMAL HIGH (ref 70–99)

## 2022-04-10 LAB — POCT ACTIVATED CLOTTING TIME
Activated Clotting Time: 266 seconds
Activated Clotting Time: 271 seconds
Activated Clotting Time: 304 seconds

## 2022-04-10 SURGERY — LEFT HEART CATH AND CORONARY ANGIOGRAPHY
Anesthesia: LOCAL

## 2022-04-10 MED ORDER — CLOPIDOGREL BISULFATE 300 MG PO TABS
ORAL_TABLET | ORAL | Status: DC | PRN
  Administered 2022-04-10: 600 mg via ORAL

## 2022-04-10 MED ORDER — CLOPIDOGREL BISULFATE 75 MG PO TABS
75.0000 mg | ORAL_TABLET | Freq: Every day | ORAL | 0 refills | Status: DC
  Filled 2022-04-10: qty 30, 30d supply, fill #0

## 2022-04-10 MED ORDER — HYDRALAZINE HCL 20 MG/ML IJ SOLN
INTRAMUSCULAR | Status: DC | PRN
  Administered 2022-04-10 (×2): 10 mg via INTRAVENOUS

## 2022-04-10 MED ORDER — SODIUM CHLORIDE 0.9 % WEIGHT BASED INFUSION
3.0000 mL/kg/h | INTRAVENOUS | Status: AC
  Administered 2022-04-10: 3 mL/kg/h via INTRAVENOUS

## 2022-04-10 MED ORDER — NITROGLYCERIN 1 MG/10 ML FOR IR/CATH LAB
INTRA_ARTERIAL | Status: DC | PRN
  Administered 2022-04-10 (×2): 200 ug via INTRACORONARY

## 2022-04-10 MED ORDER — LABETALOL HCL 5 MG/ML IV SOLN: 10.0000 mg | INTRAVENOUS | Status: DC | PRN

## 2022-04-10 MED ORDER — SODIUM CHLORIDE 0.9% FLUSH: 3.0000 mL | Freq: Two times a day (BID) | INTRAVENOUS | Status: DC

## 2022-04-10 MED ORDER — VERAPAMIL HCL 2.5 MG/ML IV SOLN
INTRAVENOUS | Status: DC | PRN
  Administered 2022-04-10: 10 mL via INTRA_ARTERIAL

## 2022-04-10 MED ORDER — LIDOCAINE HCL (PF) 1 % IJ SOLN
INTRAMUSCULAR | Status: DC | PRN
  Administered 2022-04-10: 2 mL

## 2022-04-10 MED ORDER — SODIUM CHLORIDE 0.9% FLUSH: 3.0000 mL | INTRAVENOUS | Status: DC | PRN

## 2022-04-10 MED ORDER — LIDOCAINE HCL (PF) 1 % IJ SOLN
INTRAMUSCULAR | Status: AC
  Filled 2022-04-10: qty 30

## 2022-04-10 MED ORDER — FAMOTIDINE IN NACL 20-0.9 MG/50ML-% IV SOLN
INTRAVENOUS | Status: DC | PRN
  Administered 2022-04-10: 20 mg via INTRAVENOUS

## 2022-04-10 MED ORDER — FUROSEMIDE 20 MG PO TABS
20.0000 mg | ORAL_TABLET | Freq: Every day | ORAL | 5 refills | Status: DC
  Filled 2022-04-10: qty 30, 30d supply, fill #0

## 2022-04-10 MED ORDER — MIDAZOLAM HCL 2 MG/2ML IJ SOLN
INTRAMUSCULAR | Status: DC | PRN
  Administered 2022-04-10 (×2): 1 mg via INTRAVENOUS

## 2022-04-10 MED ORDER — HEPARIN (PORCINE) IN NACL 1000-0.9 UT/500ML-% IV SOLN
INTRAVENOUS | Status: DC | PRN
  Administered 2022-04-10 (×2): 500 mL

## 2022-04-10 MED ORDER — HEPARIN SODIUM (PORCINE) 1000 UNIT/ML IJ SOLN
INTRAMUSCULAR | Status: AC
  Filled 2022-04-10: qty 10

## 2022-04-10 MED ORDER — SODIUM CHLORIDE 0.9 % WEIGHT BASED INFUSION: 1.0000 mL/kg/h | INTRAVENOUS | Status: DC

## 2022-04-10 MED ORDER — HYDRALAZINE HCL 20 MG/ML IJ SOLN: 10.0000 mg | INTRAMUSCULAR | Status: DC | PRN

## 2022-04-10 MED ORDER — HEPARIN SODIUM (PORCINE) 1000 UNIT/ML IJ SOLN
INTRAMUSCULAR | Status: DC | PRN
  Administered 2022-04-10: 2000 [IU] via INTRAVENOUS
  Administered 2022-04-10 (×2): 4500 [IU] via INTRAVENOUS
  Administered 2022-04-10: 3000 [IU] via INTRAVENOUS

## 2022-04-10 MED ORDER — FAMOTIDINE IN NACL 20-0.9 MG/50ML-% IV SOLN
INTRAVENOUS | Status: AC
  Filled 2022-04-10: qty 50

## 2022-04-10 MED ORDER — HYDRALAZINE HCL 20 MG/ML IJ SOLN
INTRAMUSCULAR | Status: AC
  Filled 2022-04-10: qty 1

## 2022-04-10 MED ORDER — NITROGLYCERIN 1 MG/10 ML FOR IR/CATH LAB
INTRA_ARTERIAL | Status: AC
  Filled 2022-04-10: qty 10

## 2022-04-10 MED ORDER — FENTANYL CITRATE (PF) 100 MCG/2ML IJ SOLN
INTRAMUSCULAR | Status: AC
  Filled 2022-04-10: qty 2

## 2022-04-10 MED ORDER — IOHEXOL 350 MG/ML SOLN
INTRAVENOUS | Status: DC | PRN
  Administered 2022-04-10: 140 mL via INTRA_ARTERIAL

## 2022-04-10 MED ORDER — VERAPAMIL HCL 2.5 MG/ML IV SOLN
INTRAVENOUS | Status: AC
  Filled 2022-04-10: qty 2

## 2022-04-10 MED ORDER — FUROSEMIDE 10 MG/ML IJ SOLN
20.0000 mg | Freq: Once | INTRAMUSCULAR | Status: AC
  Administered 2022-04-10: 20 mg via INTRAVENOUS

## 2022-04-10 MED ORDER — ASPIRIN 81 MG PO CHEW: 81.0000 mg | CHEWABLE_TABLET | ORAL | Status: DC

## 2022-04-10 MED ORDER — MIDAZOLAM HCL 2 MG/2ML IJ SOLN
INTRAMUSCULAR | Status: AC
  Filled 2022-04-10: qty 2

## 2022-04-10 MED ORDER — ASPIRIN 81 MG PO CHEW: 81.0000 mg | CHEWABLE_TABLET | Freq: Every day | ORAL | Status: DC

## 2022-04-10 MED ORDER — SODIUM CHLORIDE 0.9 % IV SOLN: 250.0000 mL | INTRAVENOUS | Status: DC | PRN

## 2022-04-10 MED ORDER — ONDANSETRON HCL 4 MG/2ML IJ SOLN
4.0000 mg | Freq: Four times a day (QID) | INTRAMUSCULAR | Status: DC | PRN
  Administered 2022-04-10: 4 mg via INTRAVENOUS

## 2022-04-10 MED ORDER — CLOPIDOGREL BISULFATE 300 MG PO TABS
ORAL_TABLET | ORAL | Status: AC
  Filled 2022-04-10: qty 2

## 2022-04-10 MED ORDER — FENTANYL CITRATE (PF) 100 MCG/2ML IJ SOLN
INTRAMUSCULAR | Status: DC | PRN
  Administered 2022-04-10 (×3): 25 ug via INTRAVENOUS

## 2022-04-10 MED ORDER — CLOPIDOGREL BISULFATE 75 MG PO TABS: 75.0000 mg | ORAL_TABLET | Freq: Every day | ORAL | Status: DC

## 2022-04-10 MED ORDER — ACETAMINOPHEN 325 MG PO TABS: 650.0000 mg | ORAL_TABLET | ORAL | Status: DC | PRN

## 2022-04-10 SURGICAL SUPPLY — 24 items
BALLN EMERGE MR 2.0X12 (BALLOONS) ×1
BALLN SCOREFLEX 2.50X15 (BALLOONS) ×1
BALLN ~~LOC~~ EMERGE MR 2.75X15 (BALLOONS) ×1
BALLOON EMERGE MR 2.0X12 (BALLOONS) IMPLANT
BALLOON SCOREFLEX 2.50X15 (BALLOONS) IMPLANT
BALLOON ~~LOC~~ EMERGE MR 2.75X15 (BALLOONS) IMPLANT
CATH 5FR JL3.5 JR4 ANG PIG MP (CATHETERS) IMPLANT
CATH LAUNCHER 6FR AL.75 (CATHETERS) IMPLANT
CATH LAUNCHER 6FR AR2 (CATHETERS) IMPLANT
DEVICE RAD TR BAND REGULAR (VASCULAR PRODUCTS) IMPLANT
GLIDESHEATH SLEND SS 6F .021 (SHEATH) IMPLANT
GUIDEWIRE INQWIRE 1.5J.035X260 (WIRE) IMPLANT
INQWIRE 1.5J .035X260CM (WIRE) ×1
KIT HEART LEFT (KITS) ×1 IMPLANT
PACK CARDIAC CATHETERIZATION (CUSTOM PROCEDURE TRAY) ×1 IMPLANT
SHEATH PROBE COVER 6X72 (BAG) IMPLANT
STENT SYNERGY XD 2.50X24 (Permanent Stent) IMPLANT
STENT SYNERGY XD 2.50X8 (Permanent Stent) IMPLANT
SYNERGY XD 2.50X24 (Permanent Stent) ×1 IMPLANT
SYNERGY XD 2.50X8 (Permanent Stent) ×1 IMPLANT
TRANSDUCER W/STOPCOCK (MISCELLANEOUS) ×1 IMPLANT
TUBING CIL FLEX 10 FLL-RA (TUBING) ×1 IMPLANT
WIRE MICRO SET SILHO 5FR 7 (SHEATH) IMPLANT
WIRE RUNTHROUGH .014X180CM (WIRE) IMPLANT

## 2022-04-10 NOTE — Progress Notes (Signed)
TR BAND REMOVAL  LOCATION:    left radial  DEFLATED PER PROTOCOL:    Yes.    TIME BAND OFF / DRESSING APPLIED:    1200 gauze dressing applied    SITE UPON ARRIVAL:    Level 0  SITE AFTER BAND REMOVAL:    Level 0  CIRCULATION SENSATION AND MOVEMENT:    Within Normal Limits   Yes.    COMMENTS:   no issues noted

## 2022-04-10 NOTE — Discharge Instructions (Signed)

## 2022-04-10 NOTE — Brief Op Note (Signed)
BRIEF CARDIAC CATHETERIZATION NOTE  DATE: 04/10/2022  TIME: 10:12 AM  PATIENT:  Stacey Roberts  57 y.o. female  PRE-OPERATIVE DIAGNOSIS:  Unstable angina  POST-OPERATIVE DIAGNOSIS:  Same  PROCEDURE:  Procedure(s): LEFT HEART CATH AND CORONARY ANGIOGRAPHY (N/A) CORONARY STENT INTERVENTION (N/A)  SURGEON:  Surgeon(s) and Role:    * Bastion Bolger, MD - Primary  FINDINGS: Severe single vessel CAD with 90% mid RCA stenosis.  Non-obstructive CAD involving left coronary artery. Normal LVEF with moderately elevated LVEDP. Successful PCI to proximal/mid RCA using overlapping Synergy 2.5 x 8 mm and 2.5 x 24 mm drug-eluting stents with 0% residual stenosis and TIMI-3 flow.  RECOMMENDATIONS: DAPT with aspirin and clopidogrel for at least 6 months. Aggressive secondary prevention of CAD.  Nelva Bush, MD [facility]

## 2022-04-10 NOTE — Progress Notes (Signed)
[facility]  Date: 04/10/22  Time: 10:15 AM  I was alerted by the patient's RN in recover that Stacey Roberts became nauseated and began complaining of chest pain.  Her only complaints during and immediately after PCI were back pain and need to void.  She was helped to the bathroom to urinate and upon returning to her bed, because nauseated with vague chest and epigastric discomfort.  Significant drop in BP noted following voiding nad having received hydralazine at the [place] of the procedure.  On my exam she appears anxious.  Heart sounds are regular.  Lungs are clear.  Left radial cath site covered with TR band.  No focal neuro deficits noted.  Repeat EKG shows NSR with incomplete RBBB and nonspecific ST changes.  I suspect her nausea may have been precipitated by sudden drop in BP following case.  Small RV marginal branches were also jailed by RCA stents and could be causing some RV ischemia as well (there are too small for intervention).  We will administer furosemide 20 mg IV x 1 due to moderately elevated LVEDP noted during cath and some shortness of breath.  She also notes transient flashes of light in her vision but does have other focal neuro deficits.  We will continue to monitor closely and admit for observation if her symptoms do not resolve.  No indication for emergent relook catheterization.  Nelva Bush, MD [facility]

## 2022-04-10 NOTE — Interval H&P Note (Signed)
History and Physical Interval Note:  04/10/2022 7:10 AM  Roosvelt Maser  has presented today for surgery, with the diagnosis of unstable angina.  The various methods of treatment have been discussed with the patient and family. After consideration of risks, benefits and other options for treatment, the patient has consented to  Procedure(s): LEFT HEART CATH AND CORONARY ANGIOGRAPHY (N/A) as a surgical intervention.  The patient's history has been reviewed, patient examined, no change in status, stable for surgery.  I have reviewed the patient's chart and labs.  Questions were answered to the patient's satisfaction.    Cath Lab Visit (complete for each Cath Lab visit)  Clinical Evaluation Leading to the Procedure:   ACS: No.  Non-ACS:    Anginal Classification: CCS IV  Anti-ischemic medical therapy: Maximal Therapy (2 or more classes of medications)  Non-Invasive Test Results: Abnormal coronary CTA with 3-vessel disease in 2021  Prior CABG: No previous CABG  Harrell Gave Katie Moch

## 2022-04-10 NOTE — Progress Notes (Addendum)
Pt was educated on stent card, stent location, Plavix and ASA use, wt restrictions, no baths/daily wash-ups, s/s of infection, ex guidelines (progressive walking and resistance exercise), s/s to stop exercising, NTG use and calling 911, diabetic diet, heart healthy diet, risk factors (T2DM), and CRPII. Pt received materials on exercise snd diet. Will refer to [facility]   [name] 04/10/2022 11:41 AM  [id number]

## 2022-04-10 NOTE — Discharge Summary (Signed)
Discharge Summary for Same Day PCI   Patient ID: Stacey Roberts MRN: [id number]; DOB: Nov 20, 1965  Admit date: 04/10/2022 Discharge date: 04/10/2022  Primary Care Provider: Jearld Fenton, NP  Primary Cardiologist: Nelva Bush, MD  Primary Electrophysiologist:  None   Discharge Diagnoses    Active Problems:   * No active hospital problems. *    Diagnostic Studies/Procedures    Cardiac Catheterization 04/10/2022:  Conclusions: Severe single-vessel coronary artery disease with 80-90% proximal/mid RCA and 60% distal RCA lesions, as well as mild-moderate, non-obstructive disease involving the left coronary artery. Normal left ventricular systolic function (LVEF A999333) with moderately elevated left ventricular filling pressure (LVEDP 25-30 mmHg). Successful PCI to proximal/mid RCA using overlapping Synergy 2.5 x 8 mm and 2.5 x 24 mm drug-eluting stents with 0% residual stenosis and TIMI-3 flow.   Recommendations: Dual antiplatelet therapy with aspirin and clopidogrel for at least 6 months. Aggressive secondary prevention of coronary artery disease. Anticipate same-day discharge following 6 hours of observation if no post-PCI complications occur.   Diagnostic Dominance: Right  Intervention   _____________   History of Present Illness     Stacey Roberts is a 57 y.o. female with history of coronary artery disease with 80% mid RCA stenosis being managed medically, hypertension, hyperlipidemia, type 2 diabetes mellitus, prior tobacco use, and breast cancer. Patient saw Dr. Saunders Revel on 3/13 and reported a dull pain in the area of her left chest, associated with lightheadedness and dyspnea. Given her history and symptoms, cardiac catheterization was arranged for further evaluation.  Hospital Course     The patient underwent cardiac cath as noted above with Dr. Saunders Revel. Plan for DAPT with ASA/Plavix for at least 12. The patient was seen by cardiac rehab while in short stay. There were  no observed complications post cath. Radial cath site was re-evaluated prior to discharge and found to be stable without any complications. Instructions/precautions regarding cath site care were given prior to discharge.  Stacey Roberts was seen by Dr. Saunders Revel and determined stable for discharge home. Follow up with our office has been arranged. Medications are listed below. Pertinent changes include addition of Plavix.    _____________  Cath/PCI Registry Performance & Quality Measures: Aspirin prescribed? - Yes ADP Receptor Inhibitor (Plavix/Clopidogrel, Brilinta/Ticagrelor or Effient/Prasugrel) prescribed (includes medically managed patients)? - Yes High Intensity Statin (Lipitor 40-80mg  or Crestor 20-40mg ) prescribed? - Yes For EF <40%, was ACEI/ARB prescribed? - Not Applicable (EF >/= [id number]) For EF <40%, Aldosterone Antagonist (Spironolactone or Eplerenone) prescribed? - Not Applicable (EF >/= [id number]) Cardiac Rehab Phase II ordered (Included Medically managed Patients)? - Yes  _____________   Discharge Vitals Blood pressure 124/68, pulse 86, temperature (!) 96.9 F (36.1 C), temperature source Temporal, resp. rate 18, height 5\' 4"  (1.626 m), weight 91.6 kg, last menstrual period 06/24/2010, SpO2 94 %.  Filed Weights   04/10/22 0610  Weight: 91.6 kg   Physical Exam Vitals reviewed.  HENT:     Head: Normocephalic.     Mouth/Throat:     Mouth: Mucous membranes are moist.  Eyes:     Pupils: Pupils are equal, round, and reactive to light.  Cardiovascular:     Rate and Rhythm: Normal rate and regular rhythm.  Pulmonary:     Effort: Pulmonary effort is normal.     Breath sounds: Normal breath sounds.  Abdominal:     General: Abdomen is flat.  Musculoskeletal:     Cervical back: Normal range of motion.  Skin:  General: Skin is warm and dry.     Capillary Refill: Capillary refill takes less than 2 seconds.     Comments: Left radial access site with dressing in place. No bleeding,  swelling, hematoma.  Neurological:     Mental Status: She is alert and oriented to person, place, and time.  Psychiatric:        Mood and Affect: Mood normal.        Behavior: Behavior normal.        Thought Content: Thought content normal.        Judgment: Judgment normal.      Last Labs & Radiologic Studies    CBC No results for input(s): "WBC", "NEUTROABS", "HGB", "HCT", "MCV", "PLT" in the last 72 hours. Basic Metabolic Panel No results for input(s): "NA", "K", "CL", "CO2", "GLUCOSE", "BUN", "CREATININE", "CALCIUM", "MG", "PHOS" in the last 72 hours. Liver Function Tests No results for input(s): "AST", "ALT", "ALKPHOS", "BILITOT", "PROT", "ALBUMIN" in the last 72 hours. No results for input(s): "LIPASE", "AMYLASE" in the last 72 hours. High Sensitivity Troponin:   No results for input(s): "TROPONINIHS" in the last 720 hours.  BNP Invalid input(s): "POCBNP" D-Dimer No results for input(s): "DDIMER" in the last 72 hours. Hemoglobin A1C No results for input(s): "HGBA1C" in the last 72 hours. Fasting Lipid Panel No results for input(s): "CHOL", "HDL", "LDLCALC", "TRIG", "CHOLHDL", "LDLDIRECT" in the last 72 hours. Thyroid Function Tests No results for input(s): "TSH", "T4TOTAL", "T3FREE", "THYROIDAB" in the last 72 hours.  Invalid input(s): "FREET3" _____________  CARDIAC CATHETERIZATION  Result Date: 04/10/2022 Conclusions: Severe single-vessel coronary artery disease with 80-90% proximal/mid RCA and 60% distal RCA lesions, as well as mild-moderate, non-obstructive disease involving the left coronary artery. Normal left ventricular systolic function (LVEF A999333) with moderately elevated left ventricular filling pressure (LVEDP 25-30 mmHg). Successful PCI to proximal/mid RCA using overlapping Synergy 2.5 x 8 mm and 2.5 x 24 mm drug-eluting stents with 0% residual stenosis and TIMI-3 flow. Recommendations: Dual antiplatelet therapy with aspirin and clopidogrel for at least 6  months. Aggressive secondary prevention of coronary artery disease. Anticipate same-day discharge following 6 hours of observation if no post-PCI complications occur. Nelva Bush, MD [facility]   Disposition   Pt is being discharged home today in good condition.  Follow-up Plans & Appointments     Discharge Instructions     Amb Referral to Cardiac Rehabilitation   Complete by: As directed    Diagnosis: Coronary Stents   After initial evaluation and assessments completed: Virtual Based Care may be provided alone or in conjunction with Phase 2 Cardiac Rehab based on patient barriers.: Yes   Intensive Cardiac Rehabilitation (ICR) [place] location only OR Traditional Cardiac Rehabilitation (TCR) *If criteria for ICR are not met will enroll in TCR [facility] only): Yes        Discharge Medications   Allergies as of 04/10/2022       Reactions   Ace Inhibitors Cough   REACTION: dry cough   Penicillins Shortness Of Breath, Rash   Pt has tolerated Cephalexin in 2017 and again in 2018.  Has patient had a PCN reaction causing immediate rash, facial/tongue/throat swelling, SOB or lightheadedness with hypotension: Yes Has patient had a PCN reaction causing severe rash involving mucus membranes or skin necrosis: Yes Has patient had a PCN reaction that required hospitalization No Has patient had a PCN reaction occurring within the last 10 years: No If all of the above answers are "NO", then may proceed with  Cephalosporin use.   Clindamycin/lincomycin Other (See Comments)   Severe stomach cramps   Ciprofloxacin Itching        Medication List     TAKE these medications    aspirin EC 81 MG tablet Take 1 tablet (81 mg total) by mouth daily.   clopidogrel 75 MG tablet Commonly known as: Plavix Take 1 tablet (75 mg total) by mouth daily.   diltiazem 120 MG 24 hr capsule Commonly known as: CARDIZEM CD Take 1 capsule (120 mg total) by mouth daily.   Farxiga 5 MG Tabs tablet Generic  drug: dapagliflozin propanediol TAKE 1 TABLET BY MOUTH DAILY.   FreeStyle Freedom Lite w/Device Kit test twice daily   freestyle lancets 1 each by Other route 2 (two) times daily. [name]   FreeStyle Libre 3 Sensor Misc Apply 1 sensor on upper arm every 14 days for continuous glucose monitoring (1 Device by Does not apply route every 14 (fourteen) days. Apply 1 sensor on upper arm every 14 days for continuous glucose monitoring)   FREESTYLE LITE test strip Generic drug: glucose blood Use 2 (two) times daily. (dx [name])   furosemide 20 MG tablet Commonly known as: Lasix Take 1 tablet (20 mg total) by mouth daily.   levothyroxine 112 MCG tablet Commonly known as: SYNTHROID Take 1 tablet (112 mcg total) by mouth daily.   losartan 50 MG tablet Commonly known as: COZAAR TAKE 1 TABLET (50 MG TOTAL) BY MOUTH DAILY.   metFORMIN 500 MG 24 hr tablet Commonly known as: GLUCOPHAGE-XR TAKE 4 TABLETS BY MOUTH DAILY WITH BREAKFAST   metoprolol tartrate 100 MG tablet Commonly known as: LOPRESSOR Take 1 tablet (100 mg total) by mouth 2 (two) times daily.   multivitamin with minerals tablet Take 1 tablet by mouth daily. Woman's Gummies   nitroGLYCERIN 0.4 MG SL tablet Commonly known as: Nitrostat Place 1 tablet (0.4 mg total) under the tongue every 5 (five) minutes as needed for chest pain.   Ozempic (2 MG/DOSE) 8 MG/3ML Sopn Generic drug: Semaglutide (2 MG/DOSE) Inject 2 mg into the skin once a week.   pantoprazole 40 MG tablet Commonly known as: PROTONIX TAKE 1 TABLET BY MOUTH DAILY.   pregabalin 75 MG capsule Commonly known as: LYRICA Take 1 capsule (75 mg total) by mouth daily.   rosuvastatin 20 MG tablet Commonly known as: CRESTOR Take 1 tablet (20 mg total) by mouth daily.   Semglee (yfgn) 100 UNIT/ML Pen Generic drug: insulin glargine-yfgn Inject 45 Units into the skin every morning.   Unifine Pentips 31G X 8 MM Misc Generic drug: Insulin Pen Needle use to inject  twice a day           Allergies Allergies  Allergen Reactions   Ace Inhibitors Cough    REACTION: dry cough   Penicillins Shortness Of Breath and Rash    Pt has tolerated Cephalexin in 2017 and again in 2018.   Has patient had a PCN reaction causing immediate rash, facial/tongue/throat swelling, SOB or lightheadedness with hypotension: Yes Has patient had a PCN reaction causing severe rash involving mucus membranes or skin necrosis: Yes Has patient had a PCN reaction that required hospitalization No Has patient had a PCN reaction occurring within the last 10 years: No If all of the above answers are "NO", then may proceed with Cephalosporin use.    Clindamycin/Lincomycin Other (See Comments)    Severe stomach cramps   Ciprofloxacin Itching    Outstanding Labs/Studies     Duration of  Discharge Encounter   Greater than 30 minutes including physician time.  Delton Coombes, PA-C 04/10/2022, 4:30 PM

## 2022-04-11 ENCOUNTER — Other Ambulatory Visit: Payer: Self-pay | Admitting: Internal Medicine

## 2022-04-11 ENCOUNTER — Encounter (HOSPITAL_COMMUNITY): Payer: Self-pay | Admitting: Internal Medicine

## 2022-04-11 ENCOUNTER — Other Ambulatory Visit: Payer: Self-pay

## 2022-04-11 MED ORDER — CLOPIDOGREL BISULFATE 75 MG PO TABS
75.0000 mg | ORAL_TABLET | Freq: Every day | ORAL | 1 refills | Status: DC
  Filled 2022-04-11 – 2022-05-01 (×2): qty 90, 90d supply, fill #0
  Filled 2022-08-05: qty 90, 90d supply, fill #1

## 2022-04-17 NOTE — Progress Notes (Signed)
 " Cardiology Office Note:    Date:  04/18/2022   ID:  Stacey Roberts, DOB 1965/04/27, MRN [id number]  PCP:  Antonette Angeline ORN, NP   [facility] Providers Cardiologist:  Lonni Hanson, MD     Referring MD: Antonette Angeline ORN, NP   Chief Complaint  Patient presents with   Follow-up    1 week follow up post cath. Patient is doing well on today. Meds reviewed     History of Present Illness:    Stacey Roberts is a 57 y.o. female with a hx of CAD (s/p PCI to proximal/mid RCA with DES on 04/10/2022), hypertension, PSVT, GERD, history of adenocarcinoma of the thyroid  in 2014, HLD, DM 2, hypothyroidism, anxiety and depression.  She was initially referred to heart care in 2014 and was evaluated by Dr. Lavona for palpitations.  Atrial bigeminy was noted.  She had an echocardiogram at this time which revealed an EF of 60%, mild LVH, no valvular abnormalities.   She was not seen again by our practice until 2021, she had recently presented to the emergency department after 4 days of palpitations and squeezing chest discomfort.  A repeat echo was ordered which overall did not show any significant abnormalities, she did have trivial AR.  She had a coronary CTA which indicated significant stenosis in the LCx, OM1, and RCA, her calcium  score was 1373 placing her in the 99th percentile for age and sex matched controls.  She underwent a left heart cath on 06/27/2019 which showed significant single-vessel coronary artery disease with 80% stenosis involving midportion of the small RCA.  She also had mild, nonobstructive disease involving the LAD and Lcx.  She wore a ZIO monitor in November 2022 which showed predominant sinus rhythm, 27 SVT/AT runs occurred, the fastest at a rate of 226 bpm longest duration was 11 bpm.  She presented on 04/05/2022 and was evaluated by Dr. Hanson for recurrent episode of chest pain.  The decision was made to proceed with cardiac catheterization. On 04/10/2022 she underwent a  left heart catheterization which showed severe single-vessel CAD with 80 to 90% proximal/mid RCA and 60% distal RCA lesions as well as mild to moderate, nonobstructive disease involving the LCA.  Successful PCI to proximal/mid RCA with 2.5 x 24 mm DES and 0% residual stenosis.  DAPT with aspirin  and Plavix  x 6 months.  She presents today for follow-up after recent PCI as outlined above.  She has been doing well from a cardiac perspective.  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  She declined cardiac rehab, she felt as if she was able to accomplish this on her own. She has noticed some SOB when she climbs her stairs, but she is not sure if this is new or if she is just paying attention closer to her health/symptoms and she has always experienced this. She denies chest pain, palpitations, dyspnea, pnd, orthopnea, n, v, dizziness, syncope, edema, weight gain, or early satiety.   Past Medical History:  Diagnosis Date   Allergy    seasonal   BRCA negative 2017   Breast cancer (HCC) 2014   Breast cancer (HCC) 2017   Bronchitis    hx of   Coronary artery disease 06/2019   80% mid RCA stenosis - medical management   Diabetes mellitus, type II (HCC)    Endometrial hyperplasia 2014   Mirena placed; Dr. 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 Faythe    Past Surgical History:  Procedure  Laterality Date   APPLICATION OF A-CELL OF EXTREMITY Left 09/29/2020   Procedure: placement of ACell;  Surgeon: Lowery Estefana RAMAN, DO;  Location: [facility];  Service: Plastics;  Laterality: Left;   BREAST BIOPSY  2000   s/p   BREAST BIOPSY Left 01/22/2013   Procedure: RE-EXCISION LEFT BREAST DCIS;  Surgeon: Vicenta DELENA Poli, MD;  Location: [facility];  Service: General;  Laterality: Left;   BREAST LUMPECTOMY Left 01/06/2013   Procedure: LUMPECTOMY;  Surgeon: Vicenta DELENA Poli, MD;  Location: [facility];  Service: General;  Laterality: Left;   BREAST LUMPECTOMY Left 2017   BREAST LUMPECTOMY WITH NEEDLE LOCALIZATION AND AXILLARY SENTINEL LYMPH NODE BX Left 09/30/2015   Procedure: Left BREAST LUMPECTOMY WITH NEEDLE LOCALIZATION AND AXILLARY SENTINEL LYMPH NODE BX;  Surgeon: Vicenta Poli, MD;  Location: [facility];  Service: General;  Laterality: Left;   BREAST RECONSTRUCTION Bilateral 10/12/2015   Procedure: BREAST oncoplastic RECONSTRUCTION;  Surgeon: Earlis Ranks, MD;  Location: [facility];  Service: Plastics;  Laterality: Bilateral;   BREAST REDUCTION SURGERY Bilateral 10/12/2015   Procedure: MAMMARY REDUCTION  (BREAST);  Surgeon: Earlis Ranks, MD;  Location: [facility];  Service: Plastics;  Laterality: Bilateral;   CORONARY ANGIOPLASTY     CORONARY STENT INTERVENTION N/A 04/10/2022   Procedure: CORONARY STENT INTERVENTION;  Surgeon: Mady Bruckner, MD;  [facility]: [facility];  Service: Cardiovascular;  Laterality: N/A;   DILATION AND CURETTAGE OF UTERUS  2004   s/p   EXCISION OF BREAST LESION Left 10/12/2015   Procedure: Re-EXCISION OF BREAST;  Surgeon: Vicenta Poli, MD;  Location: [facility];  Service: General;  Laterality: Left;   INCISION AND DRAINAGE OF WOUND Left 09/29/2020   Procedure: Excision of back and left breast wound;  Surgeon: Lowery Estefana RAMAN, DO;  Location: [facility];  Service: Plastics;  Laterality: Left;  1 hour   LATISSIMUS FLAP TO BREAST Left 07/19/2020   Procedure: Left breast radiation necrosis excision with latissimus muscle flap;  Surgeon: Lowery Estefana RAMAN, DO;  Location: [facility];  Service: Plastics;  Laterality: Left;  3 hours   LEFT HEART CATH AND CORONARY ANGIOGRAPHY N/A 06/27/2019   Procedure: LEFT HEART CATH AND CORONARY ANGIOGRAPHY;  Surgeon: Mady Bruckner, MD;  [facility]: [facility];  Service: Cardiovascular;  Laterality: N/A;   LEFT HEART CATH AND CORONARY ANGIOGRAPHY N/A 04/10/2022   Procedure: LEFT HEART CATH AND CORONARY ANGIOGRAPHY;  Surgeon: Mady Bruckner, MD;  [facility]: [facility];  Service: Cardiovascular;  Laterality: N/A;   REDUCTION MAMMAPLASTY Bilateral 09/2015   THYROIDECTOMY  02/2009   Dr Eletha   TONSILLECTOMY  1982   TONSILLECTOMY      Current Medications: Current Meds  Medication Sig  aspirin  EC 81 MG tablet Take 1 tablet (81 mg total) by mouth daily.   Blood Glucose Monitoring Suppl (FREESTYLE FREEDOM LITE) w/Device KIT test twice daily   clopidogrel  (PLAVIX ) 75 MG tablet Take 1 tablet (75 mg total) by mouth daily.   Continuous Blood Gluc Sensor (FREESTYLE LIBRE 3 SENSOR) MISC 1 Device by Does not apply route every 14 (fourteen) days. Apply 1 sensor on upper arm every 14 days for continuous glucose monitoring   dapagliflozin  propanediol (FARXIGA ) 5 MG TABS tablet TAKE 1 TABLET BY MOUTH DAILY.   diltiazem  (CARDIZEM  CD) 120 MG 24 hr capsule Take 1 capsule (120 mg total) by mouth daily.   glucose blood (FREESTYLE LITE) test strip Use 2 (two) times daily. (dx [name])   insulin  glargine-yfgn (SEMGLEE ) 100 UNIT/ML Pen Inject 45 Units into the skin every morning.   Insulin  Pen Needle (UNIFINE PENTIPS) 31G X 8 MM MISC use to inject twice a day   Lancets (FREESTYLE) lancets 1 each by Other route 2 (two) times daily. [name]   levothyroxine  (SYNTHROID ) 112 MCG tablet Take 1 tablet (112 mcg total) by  mouth daily.   losartan  (COZAAR ) 50 MG tablet TAKE 1 TABLET (50 MG TOTAL) BY MOUTH DAILY.   metFORMIN  (GLUCOPHAGE -XR) 500 MG 24 hr tablet TAKE 4 TABLETS BY MOUTH DAILY WITH BREAKFAST   metoprolol  tartrate (LOPRESSOR ) 100 MG tablet Take 1 tablet (100 mg total) by mouth 2 (two) times daily.   Multiple Vitamins-Minerals (MULTIVITAMIN WITH MINERALS) tablet Take 1 tablet by mouth daily. Woman's Gummies   nitroGLYCERIN  (NITROSTAT ) 0.4 MG SL tablet Place 1 tablet (0.4 mg total) under the tongue every 5 (five) minutes as needed for chest pain.   pantoprazole  (PROTONIX ) 40 MG tablet TAKE 1 TABLET BY MOUTH DAILY.   pregabalin  (LYRICA ) 75 MG capsule Take 1 capsule (75 mg total) by mouth daily.   rosuvastatin  (CRESTOR ) 20 MG tablet Take 1 tablet (20 mg total) by mouth daily.   Semaglutide , 2 MG/DOSE, (OZEMPIC , 2 MG/DOSE,) 8 MG/3ML SOPN Inject 2 mg into the skin once a week.     Allergies:   Ace inhibitors, Penicillins, Clindamycin /lincomycin, and Ciprofloxacin    Social History   Socioeconomic History   Marital status: Single    Spouse name: Not on file   Number of children: Not on file   Years of education: Not on file   Highest education level: Not on file  Occupational History    Employer: [employer]    Comment: Outpatient scheduling in radiology  Tobacco Use   Smoking status: Former    Packs/day: .5    Types: Cigarettes    Quit date: 09/29/2007    Years since quitting: 14.5   Smokeless tobacco: Never   Tobacco comments:    Smoked on and off for 20 years. Quit about 8 years ago (as of 08/2015)  Vaping Use   Vaping Use: Never used  Substance and Sexual Activity   Alcohol use: Not Currently    Alcohol/week: 0.0 standard drinks of alcohol   Drug use: No   Sexual activity: Not Currently  Other Topics Concern   Not on file  Social History Narrative   The patient is divorced, moved to [place]  in 2006 from [place].  She does not have any children, currently liveswith her boyfriend  and is working as a chief technology officer for [employer].  No alcohol.  Tobacco use:  She quit 5 years ago.  She smoked onand off for approximately 20 years.  No  history of recreational drugUse. Drinks two cups of coffee per work day.    Social Determinants of Health   Financial Resource Strain: Not on file  Food Insecurity: Not on file  Transportation Needs: Not on file  Physical Activity: Not on file  Stress: Not on file  Social Connections: Not on file     Family History: The patient's family history includes Alcoholism in her father; Bipolar disorder in her father; CAD (age of onset: 65) in her mother; CAD (age of onset: 71) in her maternal grandfather; COPD in her father; Cancer in an other family member; Dementia in her father; Depression in her father; Emphysema in her father; Fibroids in an other family member; Yvone' disease in her mother; Heart attack (age of onset: 26) in her mother; Heart attack (age of onset: 66) in her sister; Heart attack (age of onset: 59) in her maternal grandfather; Heart disease in her father, maternal grandfather, mother, and sister; Heart disease (age of onset: 84) in her brother; Hypertension in her father, mother, and another family member; Kidney cancer in her cousin; Lung cancer in an other family member; Other (age of onset: 63) in her sister; Prostate cancer in her brother; Thyroid  disease in her mother; Thyroid  nodules (age of onset: 12) in her brother. There is no history of Colon cancer or Colon polyps.  ROS:   Please see the history of present illness.    All other systems reviewed and are negative.  EKGs/Labs/Other Studies Reviewed:    The following studies were reviewed today:  04/10/2022 cardiac catheterization- Conclusions: Severe single-vessel coronary artery disease with 80-90% proximal/mid RCA and 60% distal RCA lesions, as well as mild-moderate, non-obstructive disease involving the left coronary artery. Normal left ventricular systolic  function (LVEF 55-65%) with moderately elevated left ventricular filling pressure (LVEDP 25-30 mmHg). Successful PCI to proximal/mid RCA using overlapping Synergy 2.5 x 8 mm and 2.5 x 24 mm drug-eluting stents with 0% residual stenosis and TIMI-3 flow.   Recommendations: Dual antiplatelet therapy with aspirin  and clopidogrel  for at least 6 months. Aggressive secondary prevention of coronary artery disease. Anticipate same-day discharge following 6 hours of observation if no post-PCI complications occur.  EKG:  EKG is  ordered today.  The ekg ordered today demonstrates SR, HR 84 bpm, incomplete RBBB, consistent with prior EKG tracings.   Recent Labs: 11/22/2021: ALT 10 04/05/2022: BUN 16; Creatinine, Ser 0.73; Hemoglobin 13.1; Platelets 317; Potassium 4.6; Sodium 140; TSH 1.300  Recent Lipid Panel    Component Value Date/Time   CHOL 136 11/22/2021 1543   CHOL 122 03/21/2017 1018   TRIG 204 (H) 11/22/2021 1543   HDL 54 11/22/2021 1543   HDL 41 03/21/2017 1018   CHOLHDL 2.5 11/22/2021 1543   VLDL 23.0 02/03/2020 0854   LDLCALC 55 11/22/2021 1543   LDLDIRECT 153.0 09/01/2014 1634     Risk Assessment/Calculations:                Physical Exam:    VS:  BP 130/84 (BP Location: Left Arm, Patient Position: Sitting, Cuff Size: Large)   Pulse 87   Ht 5' 4 (1.626 m)   Wt 207 lb (93.9 kg)   LMP 06/24/2010 (Approximate)   SpO2 98%   BMI 35.53 kg/m     Wt Readings from Last 3 Encounters:  04/18/22 207 lb (93.9 kg)  04/10/22 202 lb (91.6 kg)  04/05/22 204 lb (92.5 kg)     GEN:  Well nourished, well developed in no acute  distress HEENT: Normal NECK: No JVD; No carotid bruits LYMPHATICS: No lymphadenopathy CARDIAC: tachycardiac but regular > regular, no murmurs, rubs, gallops RESPIRATORY:  Clear to auscultation without rales, wheezing or rhonchi  ABDOMEN: Soft, non-tender, non-distended MUSCULOSKELETAL:  No edema; No deformity  SKIN: Warm and dry, healing cath site without  hematoma NEUROLOGIC:  Alert and oriented x 3 PSYCHIATRIC:  Normal affect   ASSESSMENT:    1. Coronary artery disease involving native coronary artery of native heart with unstable angina pectoris (HCC)   2. Hyperlipidemia associated with type 2 diabetes mellitus (HCC)   3. Palpitations   4. Essential hypertension   5. Type 2 diabetes mellitus with hyperglycemia, without long-term current use of insulin  (HCC)    PLAN:    In order of problems listed above:  CAD - s/p LHC on 04/10/22 with PCI to proximal/mid RCA using overlapping stents, Stable with no anginal symptoms. No indication for ischemic evaluation.  Continue aspirin , Plavix , metoprolol , Crestor .  HLD - LDL on 11/22/2021 was well-controlled at 55, continue Crestor .   Palpitations -recent ZIO on 11/10/2020 revealed 27 episodes of SVT, atrial tachycardia tachycardic today in the office upon auscultation, upon repeat EKG she converted back to sinus rhythm.  She missed her p.m. dose of Lopressor  and diltiazem  the previous day. Encouraged compliance. If she continues to have episodes of tachycardia while adhering to her medications, could increase her diltiazem . Recent TSH, BMET and CBC were WNL.   Hypertension -blood pressure today is 130/84, continue current antihypertensive medication regimen.  DM2 -managed by endocrinology.    Cardiac Rehabilitation Eligibility Assessment  The patient has declined or is not appropriate for cardiac rehabilitation. (pt declined)          Medication Adjustments/Labs and Tests Ordered: Current medicines are reviewed at length with the patient today.  Concerns regarding medicines are outlined above.  Orders Placed This Encounter  Procedures   EKG 12-Lead   No orders of the defined types were placed in this encounter. /  Patient Instructions  Medication Instructions:  No changes at this time.   *If you need a refill on your cardiac medications before your next appointment, please call  your pharmacy*   Lab Work: None  If you have labs (blood work) drawn today and your tests are completely normal, you will receive your results only by: MyChart Message (if you have MyChart) OR A paper copy in the mail If you have any lab test that is abnormal or we need to change your treatment, we will call you to review the results.   Testing/Procedures: None   Follow-Up: At [facility], you and your health needs are our priority.  As part of our continuing mission to provide you with exceptional heart care, we have created designated Provider Care Teams.  These Care Teams include your primary Cardiologist (physician) and Advanced Practice Providers (APPs -  Physician Assistants and Nurse Practitioners) who all work together to provide you with the care you need, when you need it.   Your next appointment:   3 month(s)  Provider:   Lonni Hanson, MD     Signed, Delon JAYSON Hoover, NP  04/18/2022 12:38 PM    [facility] "

## 2022-04-18 ENCOUNTER — Ambulatory Visit: Payer: 59 | Attending: Physician Assistant | Admitting: Cardiology

## 2022-04-18 ENCOUNTER — Encounter: Payer: Self-pay | Admitting: Physician Assistant

## 2022-04-18 VITALS — BP 130/84 | HR 87 | Ht 64.0 in | Wt 207.0 lb

## 2022-04-18 DIAGNOSIS — I25110 Atherosclerotic heart disease of native coronary artery with unstable angina pectoris: Secondary | ICD-10-CM

## 2022-04-18 DIAGNOSIS — R002 Palpitations: Secondary | ICD-10-CM

## 2022-04-18 DIAGNOSIS — E785 Hyperlipidemia, unspecified: Secondary | ICD-10-CM | POA: Diagnosis not present

## 2022-04-18 DIAGNOSIS — I10 Essential (primary) hypertension: Secondary | ICD-10-CM

## 2022-04-18 DIAGNOSIS — E1165 Type 2 diabetes mellitus with hyperglycemia: Secondary | ICD-10-CM | POA: Diagnosis not present

## 2022-04-18 DIAGNOSIS — E1169 Type 2 diabetes mellitus with other specified complication: Secondary | ICD-10-CM

## 2022-04-18 NOTE — Patient Instructions (Signed)
Medication Instructions:  No changes at this time.   *If you need a refill on your cardiac medications before your next appointment, please call your pharmacy*   Lab Work: None  If you have labs (blood work) drawn today and your tests are completely normal, you will receive your results only by: [place] (if you have MyChart) OR A paper copy in the mail If you have any lab test that is abnormal or we need to change your treatment, we will call you to review the results.   Testing/Procedures: None   Follow-Up: At [facility], you and your health needs are our priority.  As part of our continuing mission to provide you with exceptional heart care, we have created designated Provider Care Teams.  These Care Teams include your primary Cardiologist (physician) and Advanced Practice Providers (APPs -  Physician Assistants and Nurse Practitioners) who all work together to provide you with the care you need, when you need it.   Your next appointment:   3 month(s)  Provider:   Nelva Bush, MD

## 2022-04-25 ENCOUNTER — Telehealth: Payer: Self-pay | Admitting: Hematology and Oncology

## 2022-04-25 NOTE — Telephone Encounter (Signed)
Left patient a vm regarding appointment change  

## 2022-04-25 NOTE — Progress Notes (Unsigned)
PCP: Jearld Fenton, NP   No chief complaint on file.   HPI:      [name] is a 57 y.o. G0P0000 who LMP was Patient's last menstrual period was 06/24/2010 (approximate)., presents today for her annual examination.  Her menses are absent due to tamoxifen/Mirena IUD and possibly age.  Dysmenorrhea none. Mirena placed 08/2012 due to endometrial hyperplasia. Previous GYN suggested it stay in for 7 yrs ; removed 2/22. Pt had f/u Gyn u/s 10/22 which showed EM=7 mm. Pt without PMB and per Dr. Georgianne Fick, no further eval/screening recommended.  She has occas vasomotor sx improved with prn gabapentin. Pt to be on tamoxifen for 5 yrs total.   Sex activity: not sexually active. She does not have vaginal dryness.  Last Pap: 10/18/17  Results were: no abnormalities /neg HPV DNA.  Hx of STDs: ext HPV lesion excised from labia with GYN a few yrs ago  Last mammogram: 04/26/21, followed by oncology.   Hx of breast cancer 2014 and 2017, on tamoxifen since 2017. Had neg cancer genetic testing 2017 per pt report. There is no FH of breast cancer. There is no FH of ovarian cancer. The patient does do self-breast exams.  Colonoscopy: age 35 without abnormalities; Repeat due after 10 years.   Tobacco use: The patient denies current or previous tobacco use. Alcohol use: none  No drug use Exercise: min active  She does not get adequate calcium and Vitamin D in her diet. Hx of Vit D deficiency in past. Labs with PCP.   Past Medical History:  Diagnosis Date   Allergy    seasonal   BRCA negative 2017   Breast cancer ([place]) 2014   Breast cancer ([place]) 2017   Bronchitis    hx of   Coronary artery disease 06/2019   80% mid RCA stenosis - medical management   Diabetes mellitus, type II ([place])    Endometrial hyperplasia 2014   Mirena placed; Dr. 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 Buddy Duty    Past Surgical History:  Procedure Laterality Date   APPLICATION OF A-CELL OF EXTREMITY Left 09/29/2020   Procedure: placement of ACell;  Surgeon: Wallace Going, DO;  Location: [facility];  Service: Plastics;  Laterality: Left;   BREAST BIOPSY  2000   s/p   BREAST BIOPSY Left 01/22/2013   Procedure: RE-EXCISION LEFT BREAST DCIS;  Surgeon: Harl Bowie, MD;  Location: [facility];  Service: General;  Laterality: Left;   BREAST LUMPECTOMY Left 01/06/2013   Procedure: LUMPECTOMY;  Surgeon: Harl Bowie, MD;  Location: [facility];  Service: General;  Laterality: Left;   BREAST LUMPECTOMY Left 2017   BREAST LUMPECTOMY WITH NEEDLE LOCALIZATION AND AXILLARY SENTINEL LYMPH NODE BX Left 09/30/2015   Procedure:  Left BREAST LUMPECTOMY WITH NEEDLE LOCALIZATION AND AXILLARY SENTINEL LYMPH NODE BX;  Surgeon: Coralie Keens, MD;  Location: [facility];  Service: General;  Laterality: Left;   BREAST RECONSTRUCTION Bilateral 10/12/2015   Procedure: BREAST oncoplastic RECONSTRUCTION;  Surgeon: Irene Limbo, MD;  Location: [place];  Service: Plastics;  Laterality: Bilateral;   BREAST REDUCTION SURGERY Bilateral 10/12/2015   Procedure: MAMMARY REDUCTION  (BREAST);  Surgeon: Irene Limbo, MD;  Location: [facility];  Service: Plastics;  Laterality: Bilateral;   CORONARY ANGIOPLASTY     CORONARY STENT INTERVENTION N/A 04/10/2022   Procedure: CORONARY STENT INTERVENTION;  Surgeon: Nelva Bush, MD;  Location: [facility];  Service: Cardiovascular;  Laterality: N/A;   DILATION AND CURETTAGE OF UTERUS  2004   s/p   EXCISION OF BREAST LESION Left 10/12/2015   Procedure: Re-EXCISION OF BREAST;  Surgeon: Coralie Keens, MD;  Location: [facility];  Service: General;  Laterality: Left;   INCISION AND DRAINAGE OF WOUND Left 09/29/2020   Procedure: Excision of back and left breast wound;  Surgeon: Wallace Going, DO;  Location: [facility];  Service: Plastics;  Laterality: Left;  1 hour   LATISSIMUS FLAP TO BREAST Left 07/19/2020   Procedure: Left breast radiation necrosis excision with latissimus muscle flap;  Surgeon: Wallace Going, DO;  Location: [place];  Service: Plastics;  Laterality: Left;  3 hours   LEFT HEART CATH AND CORONARY ANGIOGRAPHY N/A 06/27/2019   Procedure: LEFT HEART CATH AND CORONARY ANGIOGRAPHY;  Surgeon: Nelva Bush, MD;  Location: [facility];  Service: Cardiovascular;  Laterality: N/A;   LEFT HEART CATH AND CORONARY ANGIOGRAPHY N/A 04/10/2022   Procedure: LEFT HEART CATH AND CORONARY ANGIOGRAPHY;  Surgeon: Nelva Bush, MD;  Location: [facility];  Service: Cardiovascular;  Laterality: N/A;   REDUCTION MAMMAPLASTY Bilateral 09/2015   THYROIDECTOMY  02/2009   Dr Harlow Asa   TONSILLECTOMY  1982   TONSILLECTOMY      Family History  Problem Relation Age of Onset   CAD Mother 57       Died age 47 of CAD   Heart disease Mother        CABG x 2   Graves' disease Mother    Hypertension Mother    Thyroid disease Mother    Heart attack Mother 60   Dementia Father        deceased age 85 secondary to dementia   Bipolar disorder Father    Emphysema Father    Heart disease Father    COPD Father        smoker   Hypertension Father    Depression Father    Alcoholism Father    Other Sister 66       hysterectomy for fibroids   Heart disease Sister        CABG x 2   Heart attack Sister 56    Prostate cancer Brother        low grade; w/ surveillance   Thyroid nodules Brother 61   Heart disease Brother 19       CABG x 4   CAD Maternal Grandfather 4   Heart disease Maternal Grandfather    Heart attack Maternal Grandfather 63   Kidney cancer Cousin        maternal 1st cousin dx 3-47; former smoker   Fibroids Other        niece dx approx 33   Hypertension Other  Lung cancer Other        maternal great aunt (MGM's sister); not a smoker   Cancer Other        nephew dx neuroblastoma at 34.14 years old   Colon cancer Neg Hx    Colon polyps Neg Hx     Social History   Socioeconomic History   Marital status: Single    Spouse name: Not on file   Number of children: Not on file   Years of education: Not on file   Highest education level: Not on file  Occupational History    Employer: [employer]    Comment: Outpatient scheduling in radiology  Tobacco Use   Smoking status: Former    Packs/day: .5    Types: Cigarettes    Quit date: 09/29/2007    Years since quitting: 14.5   Smokeless tobacco: Never   Tobacco comments:    Smoked on and off for 20 years. Quit about 8 years ago (as of 08/2015)  Vaping Use   Vaping Use: Never used  Substance and Sexual Activity   Alcohol use: Not Currently    Alcohol/week: 0.0 standard drinks of alcohol   Drug use: No   Sexual activity: Not Currently  Other Topics Concern   Not on file  Social History Narrative   The patient is divorced, moved to [place] in 2006 from [place].  She does not have any children, currently liveswith her boyfriend and is working as a Nurse, adult for [employer].  No alcohol.  Tobacco use:  She quit 5 years ago.  She smoked onand off for approximately 20 years.  No history of recreational drugUse. Drinks two cups of coffee per work day.    Social Determinants of Health   Financial Resource Strain: Not on file  Food Insecurity: Not on file  Transportation Needs: Not on file  Physical  Activity: Not on file  Stress: Not on file  Social Connections: Not on file  Intimate Partner Violence: Not on file     Current Outpatient Medications:    aspirin EC 81 MG tablet, Take 1 tablet (81 mg total) by mouth daily., Disp: 90 tablet, Rfl: 3   Blood Glucose Monitoring Suppl (FREESTYLE FREEDOM LITE) w/Device KIT, test twice daily, Disp: 1 kit, Rfl: 0   clopidogrel (PLAVIX) 75 MG tablet, Take 1 tablet (75 mg total) by mouth daily., Disp: 90 tablet, Rfl: 1   Continuous Blood Gluc Sensor (FREESTYLE LIBRE 3 SENSOR) MISC, 1 Device by Does not apply route every 14 (fourteen) days. Apply 1 sensor on upper arm every 14 days for continuous glucose monitoring, Disp: 2 each, Rfl: 2   dapagliflozin propanediol (FARXIGA) 5 MG TABS tablet, TAKE 1 TABLET BY MOUTH DAILY., Disp: 90 tablet, Rfl: 0   diltiazem (CARDIZEM CD) 120 MG 24 hr capsule, Take 1 capsule (120 mg total) by mouth daily., Disp: 90 capsule, Rfl: 3   furosemide (LASIX) 20 MG tablet, Take 1 tablet (20 mg total) by mouth daily. (Patient not taking: Reported on 04/18/2022), Disp: 30 tablet, Rfl: 5   glucose blood (FREESTYLE LITE) test strip, Use 2 (two) times daily. (dx [name]), Disp: 50 each, Rfl: 0   insulin glargine-yfgn (SEMGLEE) 100 UNIT/ML Pen, Inject 45 Units into the skin every morning., Disp: 45 mL, Rfl: 3   Insulin Pen Needle (UNIFINE PENTIPS) 31G X 8 MM MISC, use to inject twice a day, Disp: 100 each, Rfl: 3   Lancets (FREESTYLE) lancets, 1 each by Other  route 2 (two) times daily. [name], Disp: 200 each, Rfl: 0   levothyroxine (SYNTHROID) 112 MCG tablet, Take 1 tablet (112 mcg total) by mouth daily., Disp: 90 tablet, Rfl: 3   losartan (COZAAR) 50 MG tablet, TAKE 1 TABLET (50 MG TOTAL) BY MOUTH DAILY., Disp: 90 tablet, Rfl: 1   metFORMIN (GLUCOPHAGE-XR) 500 MG 24 hr tablet, TAKE 4 TABLETS BY MOUTH DAILY WITH BREAKFAST, Disp: 360 tablet, Rfl: 3   metoprolol tartrate (LOPRESSOR) 100 MG tablet, Take 1 tablet (100 mg total) by mouth 2  (two) times daily., Disp: 180 tablet, Rfl: 2   Multiple Vitamins-Minerals (MULTIVITAMIN WITH MINERALS) tablet, Take 1 tablet by mouth daily. Woman's Gummies, Disp: , Rfl:    nitroGLYCERIN (NITROSTAT) 0.4 MG SL tablet, Place 1 tablet (0.4 mg total) under the tongue every 5 (five) minutes as needed for chest pain., Disp: 25 tablet, Rfl: prn   pantoprazole (PROTONIX) 40 MG tablet, TAKE 1 TABLET BY MOUTH DAILY., Disp: 30 tablet, Rfl: 11   pregabalin (LYRICA) 75 MG capsule, Take 1 capsule (75 mg total) by mouth daily., Disp: 90 capsule, Rfl: 3   rosuvastatin (CRESTOR) 20 MG tablet, Take 1 tablet (20 mg total) by mouth daily., Disp: 90 tablet, Rfl: 1   Semaglutide, 2 MG/DOSE, (OZEMPIC, 2 MG/DOSE,) 8 MG/3ML SOPN, Inject 2 mg into the skin once a week., Disp: 9 mL, Rfl: 0     ROS:  Review of Systems  Constitutional:  Negative for fatigue, fever and unexpected weight change.  Respiratory:  Negative for cough, shortness of breath and wheezing.   Cardiovascular:  Negative for chest pain, palpitations and leg swelling.  Gastrointestinal:  Negative for blood in stool, constipation, diarrhea, nausea and vomiting.  Endocrine: Negative for cold intolerance, heat intolerance and polyuria.  Genitourinary:  Negative for dyspareunia, dysuria, flank pain, frequency, genital sores, hematuria, menstrual problem, pelvic pain, urgency, vaginal bleeding, vaginal discharge and vaginal pain.  Musculoskeletal:  Negative for back pain, joint swelling and myalgias.  Skin:  Negative for rash.  Neurological:  Negative for dizziness, syncope, light-headedness, numbness and headaches.  Hematological:  Negative for adenopathy.  Psychiatric/Behavioral:  Negative for agitation, confusion, sleep disturbance and suicidal ideas. The patient is not nervous/anxious.   BREAST: No symptoms   Objective: LMP 06/24/2010 (Approximate)    Physical Exam Constitutional:      Appearance: She is well-developed.  Genitourinary:      Vulva normal.     Right Labia: No rash, tenderness or lesions.    Left Labia: No tenderness, lesions or rash.    No vaginal discharge, erythema or tenderness.      Right Adnexa: not tender and no mass present.    Left Adnexa: not tender and no mass present.    No cervical friability or polyp.     IUD strings visualized.     Uterus is not enlarged or tender.  Breasts:    Right: No mass, nipple discharge, skin change or tenderness.     Left: No mass, nipple discharge, skin change or tenderness.  Neck:     Thyroid: No thyromegaly.  Cardiovascular:     Rate and Rhythm: Normal rate and regular rhythm.     Heart sounds: Normal heart sounds. No murmur heard. Pulmonary:     Effort: Pulmonary effort is normal.     Breath sounds: Normal breath sounds.  Abdominal:     Palpations: Abdomen is soft.     Tenderness: There is no abdominal tenderness. There is no guarding or rebound.  Musculoskeletal:        General: Normal range of motion.     Cervical back: Normal range of motion.  Lymphadenopathy:     Cervical: No cervical adenopathy.  Neurological:     General: No focal deficit present.     Mental Status: She is alert and oriented to person, place, and time.     Cranial Nerves: No cranial nerve deficit.  Skin:    General: Skin is warm and dry.  Psychiatric:        Mood and Affect: Mood normal.        Behavior: Behavior normal.        Thought Content: Thought content normal.        Judgment: Judgment normal.  Vitals reviewed.    IUD Removal Strings of IUD identified and grasped.  IUD removed without problem with ring forceps.  Pt tolerated this well.  IUD noted to be intact.  Assessment/Plan:  Encounter for annual routine gynecological examination  Endometrial hyperplasia--controlled with Mirena, placed 2014. IUD removed today. Will check GYN u/s in 6 months to assess EM. Pt still on tamoxifen. F/u prn PMB.  IUD removal--pt tolerated well.  Malignant neoplasm of central  portion of left breast in female, estrogen receptor positive (HCC)--on tamoxifen, followed by oncology; BRCA neg per pt report.          GYN counsel breast self exam, mammography screening, menopause, adequate intake of calcium and vitamin D, diet and exercise    F/U  No follow-ups on file.  Reata Petrov B. Jaylun Fleener, PA-C 04/25/2022 2:45 PM

## 2022-04-25 NOTE — Telephone Encounter (Signed)
Patient called to cancel 4/29 appointment due to being out of town. Patient stated will call back when she has a better idea of when she can make the appointment.

## 2022-04-27 ENCOUNTER — Other Ambulatory Visit (HOSPITAL_COMMUNITY)
Admission: RE | Admit: 2022-04-27 | Discharge: 2022-04-27 | Disposition: A | Discharge status: Home / Self Care | Payer: 59 | Source: Ambulatory Visit | Attending: Obstetrics and Gynecology | Admitting: Obstetrics and Gynecology

## 2022-04-27 ENCOUNTER — Ambulatory Visit (INDEPENDENT_AMBULATORY_CARE_PROVIDER_SITE_OTHER): Payer: 59 | Admitting: Obstetrics and Gynecology

## 2022-04-27 ENCOUNTER — Encounter: Payer: Self-pay | Admitting: Obstetrics and Gynecology

## 2022-04-27 VITALS — BP 122/80 | Ht 64.0 in | Wt 209.0 lb

## 2022-04-27 DIAGNOSIS — Z124 Encounter for screening for malignant neoplasm of cervix: Secondary | ICD-10-CM | POA: Insufficient documentation

## 2022-04-27 DIAGNOSIS — Z1151 Encounter for screening for human papillomavirus (HPV): Secondary | ICD-10-CM

## 2022-04-27 DIAGNOSIS — Z01419 Encounter for gynecological examination (general) (routine) without abnormal findings: Secondary | ICD-10-CM

## 2022-04-27 DIAGNOSIS — Z1231 Encounter for screening mammogram for malignant neoplasm of breast: Secondary | ICD-10-CM

## 2022-04-27 DIAGNOSIS — N951 Menopausal and female climacteric states: Secondary | ICD-10-CM

## 2022-04-27 NOTE — Patient Instructions (Signed)
I value your feedback and you entrusting us with your care. If you get a [facility] patient survey, I would appreciate you taking the time to let us know about your experience today. Thank you! ? ? ?

## 2022-05-01 ENCOUNTER — Other Ambulatory Visit: Payer: Self-pay

## 2022-05-01 ENCOUNTER — Other Ambulatory Visit: Payer: Self-pay | Admitting: Internal Medicine

## 2022-05-01 LAB — CYTOLOGY - PAP
Adequacy: ABSENT
Comment: NEGATIVE
Diagnosis: NEGATIVE
High risk HPV: NEGATIVE

## 2022-05-02 ENCOUNTER — Other Ambulatory Visit: Payer: Self-pay

## 2022-05-02 MED FILL — Rosuvastatin Calcium Tab 20 MG: ORAL | 90 days supply | Qty: 90 | Fill #0 | Status: AC

## 2022-05-02 NOTE — Telephone Encounter (Signed)
Requested Prescriptions  Pending Prescriptions Disp Refills   rosuvastatin (CRESTOR) 20 MG tablet 90 tablet 1    Sig: Take 1 tablet (20 mg total) by mouth daily.     Cardiovascular:  Antilipid - Statins 2 Failed - 05/01/2022  5:45 PM      Failed - Lipid Panel in normal range within the last 12 months    Cholesterol, Total  Date Value Ref Range Status  03/21/2017 122 100 - 199 mg/dL Final   Cholesterol  Date Value Ref Range Status  11/22/2021 136 <200 mg/dL Final   LDL Cholesterol (Calc)  Date Value Ref Range Status  11/22/2021 55 mg/dL (calc) Final    Comment:    Reference range: <100 . Desirable range <100 mg/dL for primary prevention;   <70 mg/dL for patients with CHD or diabetic patients  with > or = 2 CHD risk factors. Marland Kitchen LDL-C is now calculated using the Martin-Hopkins  calculation, which is a validated novel method providing  better accuracy than the Friedewald equation in the  estimation of LDL-C.  Horald Pollen et al. Lenox Ahr. 3009;233(00): 2061-2068  (http://education.[facility].com/faq/FAQ164)    Direct LDL  Date Value Ref Range Status  09/01/2014 153.0 mg/dL Final    Comment:    Optimal:  <100 mg/dLNear or Above Optimal:  100-129 mg/dLBorderline High:  130-159 mg/dLHigh:  160-189 mg/dLVery High:  >190 mg/dL   HDL  Date Value Ref Range Status  11/22/2021 54 > OR = 50 mg/dL Final  76/22/6333 41 >54 mg/dL Final   Triglycerides  Date Value Ref Range Status  11/22/2021 204 (H) <150 mg/dL Final    Comment:    . If a non-fasting specimen was collected, consider repeat triglyceride testing on a fasting specimen if clinically indicated.  Perry Mount et al. J. of Clin. Lipidol. 2015;9:129-169. Marland Kitchen          Passed - Cr in normal range and within 360 days    Creatinine  Date Value Ref Range Status  02/18/2013 0.8 0.6 - 1.1 mg/dL Final   Creat  Date Value Ref Range Status  11/22/2021 0.61 0.50 - 1.03 mg/dL Final   Creatinine, Ser  Date Value Ref Range Status   04/05/2022 0.73 0.44 - 1.00 mg/dL Final   Creatinine,U  Date Value Ref Range Status  06/07/2021 51.9 mg/dL Final         Passed - Patient is not pregnant      Passed - Valid encounter within last 12 months    Recent Outpatient Visits           5 months ago Coronary artery disease of native artery of native heart with stable angina pectoris [facility])   [facility] [place], Salvadore Oxford, NP   1 year ago Encounter for general adult medical examination with abnormal findings   [place] [facility] [place], Salvadore Oxford, NP       Future Appointments             In 2 months End, Cristal Deer, MD [facility]

## 2022-05-03 ENCOUNTER — Other Ambulatory Visit: Payer: Self-pay

## 2022-05-04 ENCOUNTER — Other Ambulatory Visit: Payer: Self-pay

## 2022-05-04 DIAGNOSIS — M9901 Segmental and somatic dysfunction of cervical region: Secondary | ICD-10-CM | POA: Diagnosis not present

## 2022-05-04 DIAGNOSIS — M9903 Segmental and somatic dysfunction of lumbar region: Secondary | ICD-10-CM | POA: Diagnosis not present

## 2022-05-04 DIAGNOSIS — R519 Headache, unspecified: Secondary | ICD-10-CM | POA: Diagnosis not present

## 2022-05-04 DIAGNOSIS — M62830 Muscle spasm of back: Secondary | ICD-10-CM | POA: Diagnosis not present

## 2022-05-05 ENCOUNTER — Other Ambulatory Visit: Payer: Self-pay

## 2022-05-12 ENCOUNTER — Ambulatory Visit: Payer: No Typology Code available for payment source | Admitting: Internal Medicine

## 2022-05-15 ENCOUNTER — Other Ambulatory Visit: Payer: Self-pay | Admitting: Endocrinology

## 2022-05-15 ENCOUNTER — Other Ambulatory Visit: Payer: Self-pay

## 2022-05-15 MED ORDER — OZEMPIC (2 MG/DOSE) 8 MG/3ML ~~LOC~~ SOPN
2.0000 mg | PEN_INJECTOR | SUBCUTANEOUS | 0 refills | Status: DC
  Filled 2022-05-15: qty 9, 84d supply, fill #0

## 2022-05-16 ENCOUNTER — Other Ambulatory Visit: Payer: Self-pay

## 2022-05-19 ENCOUNTER — Ambulatory Visit: Payer: No Typology Code available for payment source | Admitting: Hematology and Oncology

## 2022-05-22 ENCOUNTER — Ambulatory Visit: Payer: Self-pay | Admitting: Hematology and Oncology

## 2022-05-24 ENCOUNTER — Other Ambulatory Visit: Payer: Self-pay | Admitting: Endocrinology

## 2022-05-24 ENCOUNTER — Other Ambulatory Visit: Payer: Self-pay

## 2022-05-24 DIAGNOSIS — E1142 Type 2 diabetes mellitus with diabetic polyneuropathy: Secondary | ICD-10-CM

## 2022-05-25 ENCOUNTER — Other Ambulatory Visit: Payer: Self-pay

## 2022-05-25 MED ORDER — LEVOTHYROXINE SODIUM 112 MCG PO TABS
112.0000 ug | ORAL_TABLET | Freq: Every day | ORAL | 3 refills | Status: DC
  Filled 2022-05-25: qty 90, 90d supply, fill #0
  Filled 2022-08-24: qty 90, 90d supply, fill #1
  Filled 2022-11-21: qty 90, 90d supply, fill #2
  Filled 2023-02-15: qty 90, 90d supply, fill #3

## 2022-05-25 MED ORDER — DAPAGLIFLOZIN PROPANEDIOL 5 MG PO TABS
5.0000 mg | ORAL_TABLET | Freq: Every day | ORAL | 3 refills | Status: DC
  Filled 2022-05-25 (×2): qty 90, 90d supply, fill #0
  Filled 2022-08-24: qty 90, 90d supply, fill #1
  Filled 2022-11-21: qty 90, 90d supply, fill #2

## 2022-05-26 ENCOUNTER — Other Ambulatory Visit: Payer: Self-pay

## 2022-05-30 ENCOUNTER — Encounter: Payer: Self-pay | Admitting: Internal Medicine

## 2022-05-30 ENCOUNTER — Ambulatory Visit (INDEPENDENT_AMBULATORY_CARE_PROVIDER_SITE_OTHER): Payer: 59 | Admitting: Internal Medicine

## 2022-05-30 VITALS — BP 108/78 | HR 94 | Temp 95.5°F | Ht 64.0 in | Wt 206.0 lb

## 2022-05-30 DIAGNOSIS — E1169 Type 2 diabetes mellitus with other specified complication: Secondary | ICD-10-CM

## 2022-05-30 DIAGNOSIS — E1142 Type 2 diabetes mellitus with diabetic polyneuropathy: Secondary | ICD-10-CM

## 2022-05-30 DIAGNOSIS — Z0001 Encounter for general adult medical examination with abnormal findings: Secondary | ICD-10-CM | POA: Diagnosis not present

## 2022-05-30 DIAGNOSIS — Z1231 Encounter for screening mammogram for malignant neoplasm of breast: Secondary | ICD-10-CM | POA: Diagnosis not present

## 2022-05-30 DIAGNOSIS — Z6835 Body mass index (BMI) 35.0-35.9, adult: Secondary | ICD-10-CM | POA: Diagnosis not present

## 2022-05-30 DIAGNOSIS — E039 Hypothyroidism, unspecified: Secondary | ICD-10-CM | POA: Diagnosis not present

## 2022-05-30 DIAGNOSIS — E785 Hyperlipidemia, unspecified: Secondary | ICD-10-CM

## 2022-05-30 NOTE — Assessment & Plan Note (Signed)
Encouraged diet and exercise for weight loss ?

## 2022-05-30 NOTE — Patient Instructions (Signed)
Health Maintenance for Postmenopausal Women Menopause is a normal process in which your ability to get pregnant comes to an end. This process happens slowly over many months or years, usually between the ages of 48 and 55. Menopause is complete when you have missed your menstrual period for 12 months. It is important to talk with your health care provider about some of the most common conditions that affect women after menopause (postmenopausal women). These include heart disease, cancer, and bone loss (osteoporosis). Adopting a healthy lifestyle and getting preventive care can help to promote your health and wellness. The actions you take can also lower your chances of developing some of these common conditions. What are the signs and symptoms of menopause? During menopause, you may have the following symptoms: Hot flashes. These can be moderate or severe. Night sweats. Decrease in sex drive. Mood swings. Headaches. Tiredness (fatigue). Irritability. Memory problems. Problems falling asleep or staying asleep. Talk with your health care provider about treatment options for your symptoms. Do I need hormone replacement therapy? Hormone replacement therapy is effective in treating symptoms that are caused by menopause, such as hot flashes and night sweats. Hormone replacement carries certain risks, especially as you become older. If you are thinking about using estrogen or estrogen with progestin, discuss the benefits and risks with your health care provider. How can I reduce my risk for heart disease and stroke? The risk of heart disease, heart attack, and stroke increases as you age. One of the causes may be a change in the body's hormones during menopause. This can affect how your body uses dietary fats, triglycerides, and cholesterol. Heart attack and stroke are medical emergencies. There are many things that you can do to help prevent heart disease and stroke. Watch your blood pressure High  blood pressure causes heart disease and increases the risk of stroke. This is more likely to develop in people who have high blood pressure readings or are overweight. Have your blood pressure checked: Every 3-5 years if you are 18-39 years of age. Every year if you are 40 years old or older. Eat a healthy diet  Eat a diet that includes plenty of vegetables, fruits, low-fat dairy products, and lean protein. Do not eat a lot of foods that are high in solid fats, added sugars, or sodium. Get regular exercise Get regular exercise. This is one of the most important things you can do for your health. Most adults should: Try to exercise for at least 150 minutes each week. The exercise should increase your heart rate and make you sweat (moderate-intensity exercise). Try to do strengthening exercises at least twice each week. Do these in addition to the moderate-intensity exercise. Spend less time sitting. Even light physical activity can be beneficial. Other tips Work with your health care provider to achieve or maintain a healthy weight. Do not use any products that contain nicotine or tobacco. These products include cigarettes, chewing tobacco, and vaping devices, such as e-cigarettes. If you need help quitting, ask your health care provider. Know your numbers. Ask your health care provider to check your cholesterol and your blood sugar (glucose). Continue to have your blood tested as directed by your health care provider. Do I need screening for cancer? Depending on your health history and family history, you may need to have cancer screenings at different stages of your life. This may include screening for: Breast cancer. Cervical cancer. Lung cancer. Colorectal cancer. What is my risk for osteoporosis? After menopause, you may be   at increased risk for osteoporosis. Osteoporosis is a condition in which bone destruction happens more quickly than new bone creation. To help prevent osteoporosis or  the bone fractures that can happen because of osteoporosis, you may take the following actions: If you are 19-50 years old, get at least 1,000 mg of calcium and at least 600 international units (IU) of vitamin D per day. If you are older than age 50 but younger than age 70, get at least 1,200 mg of calcium and at least 600 international units (IU) of vitamin D per day. If you are older than age 70, get at least 1,200 mg of calcium and at least 800 international units (IU) of vitamin D per day. Smoking and drinking excessive alcohol increase the risk of osteoporosis. Eat foods that are rich in calcium and vitamin D, and do weight-bearing exercises several times each week as directed by your health care provider. How does menopause affect my mental health? Depression may occur at any age, but it is more common as you become older. Common symptoms of depression include: Feeling depressed. Changes in sleep patterns. Changes in appetite or eating patterns. Feeling an overall lack of motivation or enjoyment of activities that you previously enjoyed. Frequent crying spells. Talk with your health care provider if you think that you are experiencing any of these symptoms. General instructions See your health care provider for regular wellness exams and vaccines. This may include: Scheduling regular health, dental, and eye exams. Getting and maintaining your vaccines. These include: Influenza vaccine. Get this vaccine each year before the flu season begins. Pneumonia vaccine. Shingles vaccine. Tetanus, diphtheria, and pertussis (Tdap) booster vaccine. Your health care provider may also recommend other immunizations. Tell your health care provider if you have ever been abused or do not feel safe at home. Summary Menopause is a normal process in which your ability to get pregnant comes to an end. This condition causes hot flashes, night sweats, decreased interest in sex, mood swings, headaches, or lack  of sleep. Treatment for this condition may include hormone replacement therapy. Take actions to keep yourself healthy, including exercising regularly, eating a healthy diet, watching your weight, and checking your blood pressure and blood sugar levels. Get screened for cancer and depression. Make sure that you are up to date with all your vaccines. This information is not intended to replace advice given to you by your health care provider. Make sure you discuss any questions you have with your health care provider. Document Revised: 05/31/2020 Document Reviewed: 05/31/2020 Elsevier Patient Education  2023 [employer].  

## 2022-05-30 NOTE — Progress Notes (Signed)
Subjective:    Patient ID: Stacey Roberts, female    DOB: [birth year], 57 y.o.   MRN: [id number]  HPI  Patient presents to clinic today for her annual exam.  Flu: 10/2021 Tetanus: 04/2005 Pneumovax: 04/2005 COVID: Pfizer x 2 Shingrix: Never Pap smear: 04/2022 Mammogram: 04/2021 Colon screening: 03/2015 Vision screening: annually Dentist: biannually  Diet: She does eat meat. She consumes fruits and veggies. She does eat some fried foods. She drinks mostly water, coffee Exercise: Walking  Review of Systems     Past Medical History:  Diagnosis Date   Allergy    seasonal   BRCA negative 2017   Breast cancer (HCC) 2014   Breast cancer (HCC) 2017   Bronchitis    hx of   Coronary artery disease 06/2019   80% mid RCA stenosis - medical management   Diabetes mellitus, type II (HCC)    Endometrial hyperplasia 2014   Mirena placed; Dr. 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 Sharl Ma    Current Outpatient Medications  Medication Sig Dispense Refill   aspirin EC 81 MG tablet Take 1 tablet (81 mg total) by mouth daily. 90 tablet 3   Blood Glucose Monitoring Suppl (FREESTYLE FREEDOM LITE) w/Device KIT test twice daily 1 kit 0   clopidogrel (PLAVIX) 75 MG tablet Take 1 tablet (75 mg total) by mouth daily. 90 tablet 1   Continuous Blood Gluc Sensor (FREESTYLE LIBRE 3 SENSOR) MISC 1 Device by Does not apply route every 14  (fourteen) days. Apply 1 sensor on upper arm every 14 days for continuous glucose monitoring 2 each 2   dapagliflozin propanediol (FARXIGA) 5 MG TABS tablet Take 1 tablet (5 mg total) by mouth daily. 90 tablet 3   diltiazem (CARDIZEM CD) 120 MG 24 hr capsule Take 1 capsule (120 mg total) by mouth daily. 90 capsule 3   glucose blood (FREESTYLE LITE) test strip Use 2 (two) times daily. (dx [name]) 50 each 0   insulin glargine-yfgn (SEMGLEE) 100 UNIT/ML Pen Inject 45 Units into the skin every morning. 45 mL 3   Insulin Pen Needle (UNIFINE PENTIPS) 31G X 8 MM MISC use to inject twice a day 100 each 3   Lancets (FREESTYLE) lancets 1 each by Other route 2 (two) times daily. [name] 200 each 0   levothyroxine (SYNTHROID) 112 MCG tablet Take 1 tablet (112 mcg total) by mouth daily. 90 tablet 3   losartan (COZAAR) 50 MG tablet TAKE 1 TABLET (50 MG TOTAL) BY MOUTH DAILY. 90 tablet 1   metFORMIN (GLUCOPHAGE-XR) 500 MG 24 hr tablet TAKE 4 TABLETS BY MOUTH DAILY WITH BREAKFAST 360 tablet 3   metoprolol tartrate (LOPRESSOR) 100 MG tablet Take 1 tablet (100 mg total) by mouth 2 (two) times daily. 180 tablet 2   Multiple Vitamins-Minerals (MULTIVITAMIN  WITH MINERALS) tablet Take 1 tablet by mouth daily. Woman's Gummies     nitroGLYCERIN (NITROSTAT) 0.4 MG SL tablet Place 1 tablet (0.4 mg total) under the tongue every 5 (five) minutes as needed for chest pain. 25 tablet prn   pantoprazole (PROTONIX) 40 MG tablet TAKE 1 TABLET BY MOUTH DAILY. 30 tablet 11   pregabalin (LYRICA) 75 MG capsule Take 1 capsule (75 mg total) by mouth daily. 90 capsule 3   rosuvastatin (CRESTOR) 20 MG tablet Take 1 tablet (20 mg total) by mouth daily. 90 tablet 1   Semaglutide, 2 MG/DOSE, (OZEMPIC, 2 MG/DOSE,) 8 MG/3ML SOPN Inject 2 mg into the skin once a week. 9 mL 0   No current facility-administered medications for this visit.    Allergies  Allergen Reactions   Ace Inhibitors Cough    REACTION: dry cough   Penicillins Shortness Of  Breath and Rash    Pt has tolerated Cephalexin in 2017 and again in 2018.   Has patient had a PCN reaction causing immediate rash, facial/tongue/throat swelling, SOB or lightheadedness with hypotension: Yes Has patient had a PCN reaction causing severe rash involving mucus membranes or skin necrosis: Yes Has patient had a PCN reaction that required hospitalization No Has patient had a PCN reaction occurring within the last 10 years: No If all of the above answers are "NO", then may proceed with Cephalosporin use.    Clindamycin/Lincomycin Other (See Comments)    Severe stomach cramps   Ciprofloxacin Itching    Family History  Problem Relation Age of Onset   CAD Mother 58       Died age 43 of CAD   Heart disease Mother        CABG x 2   Graves' disease Mother    Hypertension Mother    Thyroid disease Mother    Heart attack Mother 44   Dementia Father        deceased age 61 secondary to dementia   Bipolar disorder Father    Emphysema Father    Heart disease Father    COPD Father        smoker   Hypertension Father    Depression Father    Alcoholism Father    Other Sister 66       hysterectomy for fibroids   Heart disease Sister        CABG x 2   Heart attack Sister 55   Prostate cancer Brother        low grade; w/ surveillance   Thyroid nodules Brother 75   Heart disease Brother 16       CABG x 4   CAD Maternal Grandfather 60   Heart disease Maternal Grandfather    Heart attack Maternal Grandfather 37   Kidney cancer Cousin        maternal 1st cousin dx 68-47; former smoker   Fibroids Other        niece dx approx 59   Hypertension Other    Lung cancer Other        maternal great aunt (MGM's sister); not a smoker   Cancer Other        nephew dx neuroblastoma at 79.81 years old   Colon cancer Neg Hx    Colon polyps Neg Hx     Social History   Socioeconomic History   Marital status: Single    Spouse name: Not on file   Number of children: Not on file   Years  of education: Not on file   Highest education level: Not on file  Occupational History    Employer: [employer]    Comment: Outpatient scheduling in radiology  Tobacco Use   Smoking status: Former    Packs/day: .5    Types: Cigarettes    Quit date: 09/29/2007    Years since quitting: 14.6   Smokeless tobacco: Never   Tobacco comments:    Smoked on and off for 20 years. Quit about 8 years ago (as of 08/2015)  Vaping Use   Vaping Use: Never used  Substance and Sexual Activity   Alcohol use: Not Currently    Alcohol/week: 0.0 standard drinks of alcohol   Drug use: No   Sexual activity: Not Currently    Birth control/protection: Post-menopausal  Other Topics Concern   Not on file  Social History Narrative   The patient is divorced, moved to [place] in 2006 from [place].  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  No alcohol.  Tobacco use:  She quit 5 years ago.  She smoked onand off for approximately 20 years.  No history of recreational drugUse. Drinks two cups of coffee per work day.    Social Determinants of Health   Financial Resource Strain: Not on file  Food Insecurity: Not on file  Transportation Needs: Not on file  Physical Activity: Not on file  Stress: Not on file  Social Connections: Not on file  Intimate Partner Violence: Not on file     Constitutional: Denies fever, malaise, fatigue, headache or abrupt weight changes.  HEENT: Denies eye pain, eye redness, ear pain, ringing in the ears, wax buildup, runny nose, nasal congestion, bloody nose, or sore throat. Respiratory: Denies difficulty breathing, shortness of breath, cough or sputum production.   Cardiovascular: Denies chest pain, chest tightness, palpitations or swelling in the hands or feet.  Gastrointestinal: Denies abdominal pain, bloating, constipation, diarrhea or blood in the stool.  GU: Denies urgency, frequency, pain  with urination, burning sensation, blood in urine, odor or discharge. Musculoskeletal: Denies decrease in range of motion, difficulty with gait, muscle pain or joint pain and swelling.  Skin: Denies redness, rashes, lesions or ulcercations.  Neurological: Denies dizziness, difficulty with memory, difficulty with speech or problems with balance and coordination.  Psych: Patient has a history of anxiety depression.  Denies SI/HI.  No other specific complaints in a complete review of systems (except as listed in HPI above).  Objective:   Physical Exam BP 108/78 (BP Location: Left Arm, Patient Position: Sitting, Cuff Size: Large)   Pulse 94   Temp (!) 95.5 F (35.3 C) (Temporal)   Ht 5\' 4"  (1.626 m)   Wt 206 lb (93.4 kg)   LMP 06/24/2010 (Approximate)   SpO2 97%   BMI 35.36 kg/m   Wt Readings from Last 3 Encounters:  04/27/22 209 lb (94.8 kg)  04/18/22 207 lb (93.9 kg)  04/10/22 202 lb (91.6 kg)    General: Appears her stated age, obese, in NAD. Skin: Warm, dry and intact. No ulcerations noted. HEENT: Head: normal shape and size; Eyes: sclera white, no icterus, conjunctiva pink, PERRLA and EOMs intact;  Neck:  Neck supple, trachea midline. No masses, lumps or thyromegaly present.  Cardiovascular: Normal rate and rhythm. S1,S2 noted.  No murmur, rubs or gallops noted. No JVD or BLE edema. No carotid bruits noted. Pulmonary/Chest: Normal effort and positive vesicular breath sounds.  No respiratory distress. No wheezes, rales or ronchi noted.  Abdomen: Normal bowel sounds.  Musculoskeletal: Strength 5/5 BUE/BLE. No difficulty with gait.  Neurological: Alert and oriented. Cranial nerves II-XII grossly intact. Coordination normal.  Psychiatric: Mood and affect normal. Behavior is normal. Judgment and thought content normal.    BMET    Component Value Date/Time   NA 140 04/05/2022 1752   NA 138 03/21/2017 1018   NA 137 02/18/2013 1554   K 4.6 04/05/2022 1752   K 4.4 02/18/2013 1554    CL 104 04/05/2022 1752   CO2 26 04/05/2022 1752   CO2 25 02/18/2013 1554   GLUCOSE 138 (H) 04/05/2022 1752   GLUCOSE 300 (H) 02/18/2013 1554   BUN 16 04/05/2022 1752   BUN 16 03/21/2017 1018   BUN 14.2 02/18/2013 1554   CREATININE 0.73 04/05/2022 1752   CREATININE 0.61 11/22/2021 1543   CREATININE 0.8 02/18/2013 1554   CALCIUM 9.5 04/05/2022 1752   CALCIUM 9.2 02/18/2013 1554   GFRNONAA >60 04/05/2022 1752   GFRNONAA >60 05/19/2021 0845   GFRAA >60 08/28/2019 0852   GFRAA >60 07/16/2017 1442    Lipid Panel     Component Value Date/Time   CHOL 136 11/22/2021 1543   CHOL 122 03/21/2017 1018   TRIG 204 (H) 11/22/2021 1543   HDL 54 11/22/2021 1543   HDL 41 03/21/2017 1018   CHOLHDL 2.5 11/22/2021 1543   VLDL 23.0 02/03/2020 0854   LDLCALC 55 11/22/2021 1543    CBC    Component Value Date/Time   WBC 12.2 (H) 04/05/2022 1752   RBC 4.80 04/05/2022 1752   HGB 13.1 04/05/2022 1752   HGB 13.1 05/19/2021 0845   HGB 13.5 03/21/2017 1018   HGB 12.5 02/18/2013 1554   HCT 41.2 04/05/2022 1752   HCT 42.0 03/21/2017 1018   HCT 39.6 02/18/2013 1554   PLT 317 04/05/2022 1752   PLT 250 05/19/2021 0845   PLT 306 02/18/2013 1554   MCV 85.8 04/05/2022 1752   MCV 86 03/21/2017 1018   MCV 82.6 02/18/2013 1554   MCH 27.3 04/05/2022 1752   MCHC 31.8 04/05/2022 1752   RDW 14.3 04/05/2022 1752   RDW 14.6 03/21/2017 1018   RDW 16.4 (H) 02/18/2013 1554   LYMPHSABS 1.4 05/19/2021 0845   LYMPHSABS 1.5 03/21/2017 1018   LYMPHSABS 2.0 02/18/2013 1554   MONOABS 0.5 05/19/2021 0845   MONOABS 0.6 02/18/2013 1554   EOSABS 0.2 05/19/2021 0845   EOSABS 0.1 03/21/2017 1018   BASOSABS 0.0 05/19/2021 0845   BASOSABS 0.0 03/21/2017 1018   BASOSABS 0.0 02/18/2013 1554    Hgb A1C Lab Results  Component Value Date   HGBA1C 6.5 (A) 03/21/2022            Assessment & Plan:   Preventative Health Maintenance:  Encouraged her to get a flu shot in the fall Tetanus  declined Encouraged her to get her COVID booster Pneumovax declined Discussed Shingrix vaccine, she will check coverage with her insurance company schedule a nurse visit if she would like to have this done Pap smear UTD Mammogram ordered-she will call to schedule Colon screening UTD Encouraged her to consume a balanced diet and exercise regimen Advised her seeing eye doctor and dentist annually We will check CBC, c-Met, lipid profile  RTC in 6 months, follow-up chronic conditions Nicki Reaper, NP

## 2022-05-31 LAB — CBC
HCT: 38.9 % (ref 35.0–45.0)
Hemoglobin: 12.6 g/dL (ref 11.7–15.5)
MCH: 27 pg (ref 27.0–33.0)
MCHC: 32.4 g/dL (ref 32.0–36.0)
MCV: 83.3 fL (ref 80.0–100.0)
MPV: 10.2 fL (ref 7.5–12.5)
Platelets: 321 10*3/uL (ref 140–400)
RBC: 4.67 10*6/uL (ref 3.80–5.10)
RDW: 13.9 % (ref 11.0–15.0)
WBC: 9.5 10*3/uL (ref 3.8–10.8)

## 2022-05-31 LAB — LIPID PANEL
Cholesterol: 121 mg/dL (ref <200)
HDL: 58 mg/dL (ref >=50)
LDL Cholesterol (Calc): 42 mg/dL (calc)
Non-HDL Cholesterol (Calc): 63 mg/dL (calc) (ref <130)
Total CHOL/HDL Ratio: 2.1 (calc) (ref <5.0)
Triglycerides: 133 mg/dL (ref <150)

## 2022-05-31 LAB — COMPLETE METABOLIC PANEL WITH GFR
AG Ratio: 1.6 (calc) (ref 1.0–2.5)
ALT: 8 U/L (ref 6–29)
AST: 9 U/L — ABNORMAL LOW (ref 10–35)
Albumin: 4.3 g/dL (ref 3.6–5.1)
Alkaline phosphatase (APISO): 78 U/L (ref 37–153)
BUN: 14 mg/dL (ref 7–25)
CO2: 25 mmol/L (ref 20–32)
Calcium: 9.7 mg/dL (ref 8.6–10.4)
Chloride: 104 mmol/L (ref 98–110)
Creat: 0.59 mg/dL (ref 0.50–1.03)
Globulin: 2.7 g/dL (calc) (ref 1.9–3.7)
Glucose, Bld: 96 mg/dL (ref 65–139)
Potassium: 4.5 mmol/L (ref 3.5–5.3)
Sodium: 140 mmol/L (ref 135–146)
Total Bilirubin: 0.4 mg/dL (ref 0.2–1.2)
Total Protein: 7 g/dL (ref 6.1–8.1)
eGFR: 105 mL/min/{1.73_m2} (ref >=60)

## 2022-05-31 LAB — MICROALBUMIN / CREATININE URINE RATIO
Creatinine, Urine: 89 mg/dL (ref 20–275)
Microalb Creat Ratio: 8 mg/g creat (ref <30)
Microalb, Ur: 0.7 mg/dL

## 2022-05-31 LAB — TSH+FREE T4: TSH W/REFLEX TO FT4: 0.83 mIU/L (ref 0.40–4.50)

## 2022-06-01 DIAGNOSIS — M9903 Segmental and somatic dysfunction of lumbar region: Secondary | ICD-10-CM | POA: Diagnosis not present

## 2022-06-01 DIAGNOSIS — R519 Headache, unspecified: Secondary | ICD-10-CM | POA: Diagnosis not present

## 2022-06-01 DIAGNOSIS — M62830 Muscle spasm of back: Secondary | ICD-10-CM | POA: Diagnosis not present

## 2022-06-01 DIAGNOSIS — M9901 Segmental and somatic dysfunction of cervical region: Secondary | ICD-10-CM | POA: Diagnosis not present

## 2022-06-08 ENCOUNTER — Other Ambulatory Visit: Payer: Self-pay

## 2022-06-12 ENCOUNTER — Other Ambulatory Visit: Payer: Self-pay

## 2022-06-12 ENCOUNTER — Other Ambulatory Visit: Payer: Self-pay | Admitting: Internal Medicine

## 2022-06-13 ENCOUNTER — Other Ambulatory Visit: Payer: Self-pay

## 2022-06-13 MED FILL — Losartan Potassium Tab 50 MG: ORAL | 90 days supply | Qty: 90 | Fill #0 | Status: AC

## 2022-06-13 NOTE — Telephone Encounter (Signed)
Requested Prescriptions  Pending Prescriptions Disp Refills   losartan (COZAAR) 50 MG tablet 90 tablet 0    Sig: TAKE 1 TABLET (50 MG TOTAL) BY MOUTH DAILY.     Cardiovascular:  Angiotensin Receptor Blockers Failed - 06/12/2022  6:00 PM      Failed - Valid encounter within last 6 months    Recent Outpatient Visits           2 weeks ago Encounter for general adult medical examination with abnormal findings   [facility] [place], [place], NP   6 months ago Coronary artery disease of native artery of native heart with stable angina pectoris [facility])   [place] [facility] [place], [place], NP   1 year ago Encounter for general adult medical examination with abnormal findings   [place] [facility] [place], Salvadore Oxford, NP       Future Appointments             In 1 month End, Cristal Deer, MD [facility] [place]   In 5 months Baity, Salvadore [place], NP [facility]            Passed - Cr in normal range and within 180 days    Creatinine  Date Value Ref Range Status  02/18/2013 0.8 0.6 - 1.1 mg/dL Final   Creat  Date Value Ref Range Status  05/30/2022 0.59 0.50 - 1.03 mg/dL Final   Creatinine,U  Date Value Ref Range Status  06/07/2021 51.9 mg/dL Final   Creatinine, Urine  Date Value Ref Range Status  05/30/2022 89 20 - 275 mg/dL Final         Passed - K in normal range and within 180 days    Potassium  Date Value Ref Range Status  05/30/2022 4.5 3.5 - 5.3 mmol/L Final  02/18/2013 4.4 3.5 - 5.1 mEq/L Final         Passed - Patient is not pregnant      Passed - Last BP in normal range    BP Readings from Last 1 Encounters:  05/30/22 108/78

## 2022-07-06 DIAGNOSIS — M62830 Muscle spasm of back: Secondary | ICD-10-CM | POA: Diagnosis not present

## 2022-07-06 DIAGNOSIS — R519 Headache, unspecified: Secondary | ICD-10-CM | POA: Diagnosis not present

## 2022-07-06 DIAGNOSIS — M9903 Segmental and somatic dysfunction of lumbar region: Secondary | ICD-10-CM | POA: Diagnosis not present

## 2022-07-06 DIAGNOSIS — M9901 Segmental and somatic dysfunction of cervical region: Secondary | ICD-10-CM | POA: Diagnosis not present

## 2022-07-07 ENCOUNTER — Other Ambulatory Visit: Payer: Self-pay | Admitting: Endocrinology

## 2022-07-07 ENCOUNTER — Other Ambulatory Visit: Payer: Self-pay

## 2022-07-07 MED ORDER — FREESTYLE LIBRE 3 SENSOR MISC
1.0000 | 2 refills | Status: DC
  Filled 2022-07-07: qty 2, 28d supply, fill #0
  Filled 2022-08-05: qty 2, 28d supply, fill #1
  Filled 2022-08-30: qty 2, 28d supply, fill #2

## 2022-07-13 ENCOUNTER — Ambulatory Visit
Admission: RE | Admit: 2022-07-13 | Discharge: 2022-07-13 | Disposition: A | Discharge status: Home / Self Care | Payer: 59 | Source: Ambulatory Visit | Attending: Internal Medicine | Admitting: Internal Medicine

## 2022-07-13 ENCOUNTER — Encounter: Payer: Self-pay | Admitting: "Endocrinology

## 2022-07-13 ENCOUNTER — Ambulatory Visit: Payer: 59 | Admitting: "Endocrinology

## 2022-07-13 VITALS — BP 120/80 | HR 94 | Ht 64.0 in | Wt 208.2 lb

## 2022-07-13 DIAGNOSIS — Z7985 Long-term (current) use of injectable non-insulin antidiabetic drugs: Secondary | ICD-10-CM

## 2022-07-13 DIAGNOSIS — Z794 Long term (current) use of insulin: Secondary | ICD-10-CM

## 2022-07-13 DIAGNOSIS — E7800 Pure hypercholesterolemia, unspecified: Secondary | ICD-10-CM | POA: Diagnosis not present

## 2022-07-13 DIAGNOSIS — E1159 Type 2 diabetes mellitus with other circulatory complications: Secondary | ICD-10-CM

## 2022-07-13 DIAGNOSIS — Z1231 Encounter for screening mammogram for malignant neoplasm of breast: Secondary | ICD-10-CM

## 2022-07-13 LAB — POCT GLYCOSYLATED HEMOGLOBIN (HGB A1C): Hemoglobin A1C: 6.6 % — AB (ref 4.0–5.6)

## 2022-07-13 NOTE — Patient Instructions (Signed)

## 2022-07-13 NOTE — Progress Notes (Signed)
Outpatient Endocrinology Note Stacey Ball Ground, MD  07/13/22   Stacey Roberts Stacey Roberts, Stacey Roberts [id number]  Referring Provider: [name], NP Primary Care Provider: [name], NP Reason for consultation: Subjective   Assessment & Plan  Stacey Roberts was seen today for diabetes and hypothyroidism.  Diagnoses and all orders for this visit:  Type 2 diabetes mellitus with vascular disease (HCC) -     POCT glycosylated hemoglobin (Hb A1C)  Long-term insulin use (HCC)  Long-term (current) use of injectable non-insulin antidiabetic drugs  Pure hypercholesterolemia   Diabetes Type 2 complicated by CAD s/p stent  Lab Results  Component Value Date   GFR 100.66 08/11/2021   Hba1c goal less than 7, current Hba1c is  Lab Results  Component Value Date   HGBA1C 6.6 (A) 07/13/2022   Will recommend the following: Ozempic 2mg  weekly, metformin ER 2 g daily, Farxiga 5 mg daily   Semglee 45 units bioequivalent glargine in the morning daily    Stopped Farxiga 10 mg every day due to yeast infection    No known contraindications to any of above medications  -Last LD and Tg are as follows: Lab Results  Component Value Date   LDLCALC 42 05/30/2022    Lab Results  Component Value Date   TRIG 133 05/30/2022   -on rosuvastatin 20 mg QD -Follow low fat diet and exercise   -Blood pressure goal <140/90 - Microalbumin/creatinine goal < 30 -Last MA/Cr is as follows: Lab Results  Component Value Date   MICROALBUR 0.7 05/30/2022   -on ACE/ARB losartan 50 mg every day  -diet changes including salt restriction -limit eating outside -counseled BP targets per standards of diabetes care -uncontrolled blood pressure can lead to retinopathy, nephropathy and cardiovascular and atherosclerotic heart disease  Reviewed and counseled on: -A1C target -Blood sugar targets -Complications of uncontrolled diabetes  -Checking blood sugar before meals and bedtime and bring log next visit -All  medications with mechanism of action and side effects -Hypoglycemia management: rule of 15's, Glucagon Emergency Kit and medical alert ID -low-carb low-fat plate-method diet -At least 20 minutes of physical activity per day -Annual dilated retinal eye exam and foot exam -compliance and follow up needs -follow up as scheduled or earlier if problem gets worse  Call if blood sugar is less than 70 or consistently above 250    Take a 15 gm snack of carbohydrate at bedtime before you go to sleep if your blood sugar is less than 100.    If you are going to fast after midnight for a test or procedure, ask your physician for instructions on how to reduce/decrease your insulin dose.    Call if blood sugar is less than 70 or consistently above 250  -Treating a low sugar by rule of 15  (15 gms of sugar every 15 min until sugar is more than 70) If you feel your sugar is low, test your sugar to be sure If your sugar is low (less than 70), then take 15 grams of a fast acting Carbohydrate (3-4 glucose tablets or glucose gel or 4 ounces of juice or regular soda) Recheck your sugar 15 min after treating low to make sure it is more than 70 If sugar is still less than 70, treat again with 15 grams of carbohydrate          Don't drive the hour of hypoglycemia  If unconscious/unable to eat or drink by mouth, use glucagon injection or nasal spray baqsimi and call  911. Can repeat again in 15 min if still unconscious.  Return in about 4 months (around 11/12/2022).   I have reviewed current medications, nurse's notes, allergies, vital signs, past medical and surgical history, family medical history, and social history for this encounter. Counseled patient on symptoms, examination findings, lab findings, imaging results, treatment decisions and monitoring and prognosis. The patient understood the recommendations and agrees with the treatment plan. All questions regarding treatment plan were fully answered.  Stacey [place], MD  07/13/22    History of Present Illness Stacey Roberts is a 57 y.o. year old female who presents for evaluation of Type 2 diabetes mellitus.  Stacey Roberts was first diagnosed in 2008.    Home diabetes regimen: Ozempic 2mg  weekly, metformin ER 2 g daily, Farxiga 5 mg daily   Semglee 45 units bioequivalent glargine in the morning daily           Side effects from medications: Yeast infection from 10 mg Farxiga, nausea from Trulicity  Previous history: She has been on insulin since 2017 Previous A1c range = 7.3-10.4 She was started on Ozempic in 2021 and this has been increased periodically and her last dose of 2 mg was started in 2/23  COMPLICATIONS -  Stroke, CAD s/p cardiac stent -  retinopathy -  neuropathy -  nephropathy  BLOOD SUGAR DATA  CGM interpretation: At today's visit, we reviewed her CGM downloads. The full report is scanned in the media. Reviewing the CGM trends, BG are well controlled with slight peak in morning.    Physical Exam  BP 120/80   Pulse 94   Ht 5\' 4"  (1.626 m)   Wt 208 lb 3.2 oz (94.4 kg)   LMP 06/24/2010 (Approximate)   SpO2 96%   BMI 35.74 kg/m    Constitutional: well developed, well nourished Head: normocephalic, atraumatic Eyes: sclera anicteric, no redness Neck: supple Lungs: normal respiratory effort Neurology: alert and oriented Skin: dry, no appreciable rashes Musculoskeletal: no appreciable defects Psychiatric: normal mood and affect Diabetic Foot Exam - Simple   No data filed      Current Medications Patient's Medications  New Prescriptions   No medications on file  Previous Medications   ASPIRIN EC 81 MG TABLET    Take 1 tablet (81 mg total) by mouth daily.   BLOOD GLUCOSE MONITORING SUPPL (FREESTYLE FREEDOM LITE) W/DEVICE KIT    test twice daily   CLOPIDOGREL (PLAVIX) 75 MG TABLET    Take 1 tablet (75 mg total) by mouth daily.   CONTINUOUS GLUCOSE SENSOR (FREESTYLE LIBRE 3 SENSOR) MISC    1 Device by  Does not apply route every 14 (fourteen) days. Apply 1 sensor on upper arm every 14 days for continuous glucose monitoring   DAPAGLIFLOZIN PROPANEDIOL (FARXIGA) 5 MG TABS TABLET    Take 1 tablet (5 mg total) by mouth daily.   DILTIAZEM (CARDIZEM CD) 120 MG 24 HR CAPSULE    Take 1 capsule (120 mg total) by mouth daily.   GLUCOSE BLOOD (FREESTYLE LITE) TEST STRIP    Use 2 (two) times daily. (dx [name])   INSULIN GLARGINE-YFGN (SEMGLEE) 100 UNIT/ML PEN    Inject 45 Units into the skin every morning.   INSULIN PEN NEEDLE (UNIFINE PENTIPS) 31G X 8 MM MISC    use to inject twice a day   LANCETS (FREESTYLE) LANCETS    1 each by Other route 2 (two) times daily. [name]   LEVOTHYROXINE (SYNTHROID) 112 MCG TABLET  Take 1 tablet (112 mcg total) by mouth daily.   LOSARTAN (COZAAR) 50 MG TABLET    Take 1 tablet (50 mg total) by mouth daily.   METFORMIN (GLUCOPHAGE-XR) 500 MG 24 HR TABLET    TAKE 4 TABLETS BY MOUTH DAILY WITH BREAKFAST   METOPROLOL TARTRATE (LOPRESSOR) 100 MG TABLET    Take 1 tablet (100 mg total) by mouth 2 (two) times daily.   MULTIPLE VITAMINS-MINERALS (MULTIVITAMIN WITH MINERALS) TABLET    Take 1 tablet by mouth daily. Woman's Gummies   NITROGLYCERIN (NITROSTAT) 0.4 MG SL TABLET    Place 1 tablet (0.4 mg total) under the tongue every 5 (five) minutes as needed for chest pain.   PANTOPRAZOLE (PROTONIX) 40 MG TABLET    TAKE 1 TABLET BY MOUTH DAILY.   PREGABALIN (LYRICA) 75 MG CAPSULE    Take 1 capsule (75 mg total) by mouth daily.   ROSUVASTATIN (CRESTOR) 20 MG TABLET    Take 1 tablet (20 mg total) by mouth daily.   SEMAGLUTIDE, 2 MG/DOSE, (OZEMPIC, 2 MG/DOSE,) 8 MG/3ML SOPN    Inject 2 mg into the skin once a week.  Modified Medications   No medications on file  Discontinued Medications   No medications on file    Allergies Allergies  Allergen Reactions   Ace Inhibitors Cough    REACTION: dry cough   Penicillins Shortness Of Breath and Rash    Pt has tolerated Cephalexin in 2017 and  again in 2018.   Has patient had a PCN reaction causing immediate rash, facial/tongue/throat swelling, SOB or lightheadedness with hypotension: Yes Has patient had a PCN reaction causing severe rash involving mucus membranes or skin necrosis: Yes Has patient had a PCN reaction that required hospitalization No Has patient had a PCN reaction occurring within the last 10 years: No If all of the above answers are "NO", then may proceed with Cephalosporin use.    Clindamycin/Lincomycin Other (See Comments)    Severe stomach cramps   Ciprofloxacin Itching    Past Medical History Past Medical History:  Diagnosis Date   Allergy    seasonal   BRCA negative 2017   Breast cancer (HCC) 2014   Breast cancer (HCC) 2017   Bronchitis    hx of   Coronary artery disease 06/2019   80% mid RCA stenosis - medical management   Diabetes mellitus, type II (HCC)    Endometrial hyperplasia 2014   Mirena placed; Dr. 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 Sharl Ma    Past Surgical History Past Surgical History:  Procedure Laterality Date   APPLICATION OF A-CELL OF EXTREMITY Left 09/29/2020   Procedure: placement of ACell;  Surgeon: Peggye Form, DO;  Location: [facility];  Service: Plastics;  Laterality: Left;   BREAST BIOPSY  2000  s/p   BREAST BIOPSY Left 01/22/2013   Procedure: RE-EXCISION LEFT BREAST DCIS;  Surgeon:  Shelly Rubenstein, MD;  Location: [facility];  Service: General;  Laterality: Left;   BREAST LUMPECTOMY Left 01/06/2013   Procedure: LUMPECTOMY;  Surgeon: Shelly Rubenstein, MD;  Location: [facility];  Service: General;  Laterality: Left;   BREAST LUMPECTOMY Left 2017   BREAST LUMPECTOMY WITH NEEDLE LOCALIZATION AND AXILLARY SENTINEL LYMPH NODE BX Left 09/30/2015   Procedure: Left BREAST LUMPECTOMY WITH NEEDLE LOCALIZATION AND AXILLARY SENTINEL LYMPH NODE BX;  Surgeon: [name], MD;  Location: [facility];  Service: General;  Laterality: Left;   BREAST RECONSTRUCTION Bilateral 10/12/2015   Procedure: BREAST oncoplastic RECONSTRUCTION;  Surgeon: [name], MD;  Location: [facility];  Service: Plastics;  Laterality: Bilateral;   BREAST REDUCTION SURGERY Bilateral 10/12/2015   Procedure: MAMMARY REDUCTION  (BREAST);  Surgeon: [name], MD;  Location: [facility];  Service: Plastics;  Laterality: Bilateral;   CORONARY ANGIOPLASTY     CORONARY STENT INTERVENTION N/A 04/10/2022   Procedure: CORONARY STENT INTERVENTION;  Surgeon: Yvonne Kendall, MD;  [facility]: [facility];  Service: Cardiovascular;  Laterality: N/A;   DILATION AND CURETTAGE OF UTERUS  2004   s/p   EXCISION OF BREAST LESION Left 10/12/2015   Procedure: Re-EXCISION OF BREAST;  Surgeon: [name], MD;  Location: [facility];  Service: General;  Laterality: Left;   INCISION AND DRAINAGE OF WOUND Left 09/29/2020   Procedure: Excision of back and left breast wound;  Surgeon: Peggye Form, DO;  Location: [facility];  Service: Plastics;  Laterality: Left;  1 hour   LATISSIMUS FLAP TO BREAST Left 07/19/2020   Procedure: Left breast radiation necrosis excision with latissimus muscle flap;  Surgeon: Peggye Form, DO;  Location: [facility];  Service: Plastics;  Laterality: Left;  3 hours   LEFT HEART CATH AND CORONARY ANGIOGRAPHY N/A  06/27/2019   Procedure: LEFT HEART CATH AND CORONARY ANGIOGRAPHY;  Surgeon: Yvonne Kendall, MD;  [facility];  Service: Cardiovascular;  Laterality: N/A;   LEFT HEART CATH AND CORONARY ANGIOGRAPHY N/A 04/10/2022   Procedure: LEFT HEART CATH AND CORONARY ANGIOGRAPHY;  Surgeon: Yvonne Kendall, MD;  [facility];  Service: Cardiovascular;  Laterality: N/A;   REDUCTION MAMMAPLASTY Bilateral 09/2015   THYROIDECTOMY  02/2009   Dr Gerrit Friends   TONSILLECTOMY  1982   TONSILLECTOMY      Family History family history includes Alcoholism in her father; Bipolar disorder in her father; CAD (age of onset: 17) in her mother; CAD (age of onset: 15) in her maternal grandfather; COPD in her father; Cancer in an other family member; Dementia in her father; Depression in her father; Emphysema in her father; Fibroids in an other family member; Luiz Blare' disease in her mother; Heart attack (age of onset: 65) in her mother; Heart attack (age of onset: 70) in her sister; Heart attack (age of onset: 35) in her maternal grandfather; Heart disease in her father, maternal grandfather, mother, and sister; Heart disease (age of onset: 67) in her brother; Hypertension in her father, mother, and another family member; Kidney cancer in her cousin; Lung cancer in an other family member; Other (age of onset: 40) in her sister; Prostate cancer in her brother; Thyroid disease in her mother; Thyroid nodules (age of onset: 25) in her brother.  Social History Social History   Socioeconomic History   Marital  status: Single    Spouse name: Not on file   Number of children: Not on file   Years of education: Not on file   Highest education level: Not on file  Occupational History    Employer: [employer]    Comment: Outpatient scheduling in radiology  Tobacco Use   Smoking status: Former    Packs/day: .5    Types: Cigarettes    Quit date: 09/29/2007    Years since quitting: 14.7   Smokeless tobacco:  Never   Tobacco comments:    Smoked on and off for 20 years. Quit about 8 years ago (as of 08/2015)  Vaping Use   Vaping Use: Never used  Substance and Sexual Activity   Alcohol use: Not Currently    Alcohol/week: 0.0 standard drinks of alcohol   Drug use: No   Sexual activity: Not Currently    Birth control/protection: Post-menopausal  Other Topics Concern   Not on file  Social History Narrative   The patient is divorced, moved to [place] in 2006 from [place].  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  No alcohol.  Tobacco use:  She quit 5 years ago.  She smoked onand off for approximately 20 years.  No history of recreational drugUse. Drinks two cups of coffee per work day.    Social Determinants of Health   Financial Resource Strain: Not on file  Food Insecurity: Not on file  Transportation Needs: Not on file  Physical Activity: Not on file  Stress: Not on file  Social Connections: Not on file  Intimate Partner Violence: Not on file    Lab Results  Component Value Date   HGBA1C 6.6 (A) 07/13/2022   HGBA1C 6.5 (A) 03/21/2022   HGBA1C 6.5 (A) 11/17/2021   Lab Results  Component Value Date   CHOL 121 05/30/2022   Lab Results  Component Value Date   HDL 58 05/30/2022   Lab Results  Component Value Date   LDLCALC 42 05/30/2022   Lab Results  Component Value Date   TRIG 133 05/30/2022   Lab Results  Component Value Date   CHOLHDL 2.1 05/30/2022   Lab Results  Component Value Date   CREATININE 0.59 05/30/2022   Lab Results  Component Value Date   GFR 100.66 08/11/2021   Lab Results  Component Value Date   MICROALBUR 0.7 05/30/2022      Component Value Date/Time   NA 140 05/30/2022 1539   NA 138 03/21/2017 1018   NA 137 02/18/2013 1554   K 4.5 05/30/2022 1539   K 4.4 02/18/2013 1554   CL 104 05/30/2022 1539   CO2 25 05/30/2022 1539   CO2 25 02/18/2013 1554    GLUCOSE 96 05/30/2022 1539   GLUCOSE 300 (H) 02/18/2013 1554   BUN 14 05/30/2022 1539   BUN 16 03/21/2017 1018   BUN 14.2 02/18/2013 1554   CREATININE 0.59 05/30/2022 1539   CREATININE 0.8 02/18/2013 1554   CALCIUM 9.7 05/30/2022 1539   CALCIUM 9.2 02/18/2013 1554   PROT 7.0 05/30/2022 1539   PROT 6.5 03/21/2017 1018   PROT 7.2 02/18/2013 1554   ALBUMIN 3.8 05/19/2021 0845   ALBUMIN 3.7 03/21/2017 1018   ALBUMIN 3.6 02/18/2013 1554   AST 9 (L) 05/30/2022 1539   AST 14 (L) 05/19/2021 0845   AST 10 02/18/2013 1554   ALT 8 05/30/2022 1539   ALT 15  05/19/2021 0845   ALT 16 02/18/2013 1554   ALKPHOS 65 05/19/2021 0845   ALKPHOS 85 02/18/2013 1554   BILITOT 0.4 05/30/2022 1539   BILITOT 0.4 05/19/2021 0845   BILITOT 0.33 02/18/2013 1554   GFRNONAA >60 04/05/2022 1752   GFRNONAA >60 05/19/2021 0845   GFRAA >60 Roberts/05/2019 0852   GFRAA >60 07/16/2017 1442      Latest Ref Rng & Units 05/30/2022    3:39 PM 04/05/2022    5:52 PM 11/22/2021    3:43 PM  BMP  Glucose 65 - 139 mg/dL 96  161  93   BUN 7 - 25 mg/dL 14  16  15    Creatinine 0.50 - 1.03 mg/dL 0.96  0.45  4.09   BUN/Creat Ratio 6 - 22 (calc) SEE NOTE:   SEE NOTE:   Sodium 135 - 146 mmol/L 140  140  139   Potassium 3.5 - 5.3 mmol/L 4.5  4.6  4.2   Chloride 98 - 110 mmol/L 104  104  102   CO2 20 - 32 mmol/L 25  26  25    Calcium 8.6 - 10.4 mg/dL 9.7  9.5  9.2        Component Value Date/Time   WBC 9.5 05/30/2022 1539   RBC 4.67 05/30/2022 1539   HGB 12.6 05/30/2022 1539   HGB 13.1 05/19/2021 0845   HGB 13.5 03/21/2017 1018   HGB 12.5 02/18/2013 1554   HCT 38.9 05/30/2022 1539   HCT 42.0 03/21/2017 1018   HCT 39.6 02/18/2013 1554   PLT 321 05/30/2022 1539   PLT 250 05/19/2021 0845   PLT 306 02/18/2013 1554   MCV 83.3 05/30/2022 1539   MCV 86 03/21/2017 1018   MCV 82.6 02/18/2013 1554   MCH 27.0 05/30/2022 1539   MCHC 32.4 05/30/2022 1539   RDW 13.9 05/30/2022 1539   RDW 14.6 03/21/2017 1018   RDW 16.4 (H)  02/18/2013 1554   LYMPHSABS 1.4 05/19/2021 0845   LYMPHSABS 1.5 03/21/2017 1018   LYMPHSABS 2.0 02/18/2013 1554   MONOABS 0.5 05/19/2021 0845   MONOABS 0.6 02/18/2013 1554   EOSABS 0.2 05/19/2021 0845   EOSABS 0.1 03/21/2017 1018   BASOSABS 0.0 05/19/2021 0845   BASOSABS 0.0 03/21/2017 1018   BASOSABS 0.0 02/18/2013 1554     Parts of this note may have been dictated using voice recognition software. There may be variances in spelling and vocabulary which are unintentional. Not all errors are proofread. Please notify the Thereasa Parkin if any discrepancies are noted or if the meaning of any statement is not clear.

## 2022-07-14 ENCOUNTER — Encounter: Payer: Self-pay | Admitting: "Endocrinology

## 2022-07-20 ENCOUNTER — Encounter: Payer: Self-pay | Admitting: Internal Medicine

## 2022-07-20 ENCOUNTER — Ambulatory Visit: Payer: 59 | Attending: Internal Medicine | Admitting: Internal Medicine

## 2022-07-20 VITALS — BP 128/80 | HR 92 | Ht 64.0 in | Wt 209.0 lb

## 2022-07-20 DIAGNOSIS — I152 Hypertension secondary to endocrine disorders: Secondary | ICD-10-CM | POA: Diagnosis not present

## 2022-07-20 DIAGNOSIS — E1159 Type 2 diabetes mellitus with other circulatory complications: Secondary | ICD-10-CM

## 2022-07-20 DIAGNOSIS — R002 Palpitations: Secondary | ICD-10-CM | POA: Diagnosis not present

## 2022-07-20 DIAGNOSIS — Z7984 Long term (current) use of oral hypoglycemic drugs: Secondary | ICD-10-CM

## 2022-07-20 DIAGNOSIS — E785 Hyperlipidemia, unspecified: Secondary | ICD-10-CM

## 2022-07-20 DIAGNOSIS — I25118 Atherosclerotic heart disease of native coronary artery with other forms of angina pectoris: Secondary | ICD-10-CM

## 2022-07-20 NOTE — Progress Notes (Signed)
Cardiology Office Note:  .   Date:  07/20/2022  ID:  Stacey Roberts, DOB March 09, 1965, MRN [id number] PCP: [name], NP  [facility] Providers Cardiologist:  Yvonne Kendall, MD    History of Present Illness: .   Stacey Roberts is a 57 y.o. female with history of coronary artery disease with PCI to the mid RCA in 03/2022, hypertension, hyperlipidemia, type 2 diabetes mellitus, breast cancer, and prior tobacco use, who presents for follow-up of coronary artery disease.  She was last seen in our office in March by Wallis Bamberg, NP, following PCI to the RCA, at which time she was feeling well.  She declined participation in cardiac rehab, as she felt like she would be able to exercise adequately on her own.  She noticed some dyspnea when climbing stairs but was unsure if this was new compared to pre-PCI or if she was just more attentive to her body.  No medication changes or additional testing were pursued.  Today, Stacey Roberts reports that she has been feeling fairly well.  She continues to have sporadic flutters with vague discomfort in her chest that typically only lasts a few seconds.  She does not have any exertional chest pain.  Overall, she thinks that she is doing well following her PCI in March.  She tries to walk regularly and climb stairs but is still not interested in participating in cardiac rehab.  She notes mild dependent leg edema when sitting for prolonged periods.  She has not had any shortness of breath.  She remains compliant with her medications, including aspirin and clopidogrel, without bleeding or other side effects.  ROS: See HPI  Studies Reviewed: Marland Kitchen       LHC/PCI (04/10/2022): Severe single-vessel coronary artery disease with 80-90% proximal/mid RCA and 60% distal RCA lesions, as well as mild-moderate, non-obstructive disease involving the left coronary artery. Normal left ventricular systolic function (LVEF 55-65%) with moderately elevated left ventricular  filling pressure (LVEDP 25-30 mmHg). Successful PCI to proximal/mid RCA using overlapping Synergy 2.5 x 8 mm and 2.5 x 24 mm drug-eluting stents with 0% residual stenosis and TIMI-3 flow.  Risk Assessment/Calculations:             Physical Exam:   VS:  BP 128/80 (BP Location: Right Arm)   Pulse 92   Ht 5\' 4"  (1.626 m)   Wt 209 lb (94.8 kg)   LMP 06/24/2010 (Approximate)   SpO2 98%   BMI 35.87 kg/m    Wt Readings from Last 3 Encounters:  07/20/22 209 lb (94.8 kg)  07/13/22 208 lb 3.2 oz (94.4 kg)  05/30/22 206 lb (93.4 kg)    GEN: Well nourished, well developed in no acute distress NECK: No JVD; No carotid bruits CARDIAC: RRR with 1/6 systolic murmur. RESPIRATORY:  Clear to auscultation without rales, wheezing or rhonchi  ABDOMEN: Soft, non-tender, non-distended EXTREMITIES:  No edema; No deformity   ASSESSMENT AND PLAN: .    Coronary artery disease with stable angina and hyperlipidemia: Ms. Medico continues to do well following PCI to the RCA in March.  Her transient palpitations and vague chest comfort are not consistent with angina.  We will defer further workup/intervention at this time and continue her current antianginal therapy consisting of diltiazem and metoprolol.  She will need to continue at least 6 months of dual antiplatelet therapy with aspirin and clopidogrel from her intervention in March.  Lipids well-controlled on last check on current dose of rosuvastatin.  Palpitations:  Symptoms have been fairly mild and self-limited.  Monitor in 2022 showed short runs of PSVT as well as rare PACs and PVCs.  Continue current regimen of diltiazem and metoprolol.  Hypertension associated with type 2 diabetes mellitus: Blood pressure mildly elevated on initial check, improved and upper normal on recheck.  Continue current regimen of diltiazem, metoprolol, and losartan.  Morbid obesity: BMI is greater than 35 with multiple comorbidities (diabetes mellitus, CAD, and  hypertension.  I encouraged Ms. Rison to remain active in an effort to lose weight.  Hopefully, semaglutide will offer some benefit as well.   Dispo: RTC in 3 months.  Signed, Yvonne Kendall, MD

## 2022-07-20 NOTE — Patient Instructions (Signed)
Medication Instructions:  Your physician recommends that you continue on your current medications as directed. Please refer to the Current Medication list given to you today.  *If you need a refill on your cardiac medications before your next appointment, please call your pharmacy*   Lab Work: None ordered today   Testing/Procedures: None ordered today   Follow-Up: At [facility], you and your health needs are our priority.  As part of our continuing mission to provide you with exceptional heart care, we have created designated Provider Care Teams.  These Care Teams include your primary Cardiologist (physician) and Advanced Practice Providers (APPs -  Physician Assistants and Nurse Practitioners) who all work together to provide you with the care you need, when you need it.  We recommend signing up for the patient portal called "MyChart".  Sign up information is provided on this After Visit Summary.  MyChart is used to connect with patients for Virtual Visits (Telemedicine).  Patients are able to view lab/test results, encounter notes, upcoming appointments, etc.  Non-urgent messages can be sent to your provider as well.   To learn more about what you can do with MyChart, go to [email or web address].    Your next appointment:   3 month(s)  Provider:   You may see Christopher End, MD or one of the following Advanced Practice Providers on your designated Care Team:   Christopher Berge, NP Ryan Dunn, PA-C Cadence Furth, PA-C Sheri Hammock, NP     

## 2022-07-30 MED FILL — Rosuvastatin Calcium Tab 20 MG: ORAL | 90 days supply | Qty: 90 | Fill #1 | Status: AC

## 2022-08-03 DIAGNOSIS — R519 Headache, unspecified: Secondary | ICD-10-CM | POA: Diagnosis not present

## 2022-08-03 DIAGNOSIS — M9901 Segmental and somatic dysfunction of cervical region: Secondary | ICD-10-CM | POA: Diagnosis not present

## 2022-08-03 DIAGNOSIS — M9903 Segmental and somatic dysfunction of lumbar region: Secondary | ICD-10-CM | POA: Diagnosis not present

## 2022-08-03 DIAGNOSIS — M62830 Muscle spasm of back: Secondary | ICD-10-CM | POA: Diagnosis not present

## 2022-08-05 ENCOUNTER — Other Ambulatory Visit: Payer: Self-pay | Admitting: Endocrinology

## 2022-08-06 ENCOUNTER — Other Ambulatory Visit: Payer: Self-pay | Admitting: Endocrinology

## 2022-08-06 ENCOUNTER — Other Ambulatory Visit: Payer: Self-pay

## 2022-08-07 ENCOUNTER — Other Ambulatory Visit: Payer: Self-pay

## 2022-08-07 MED FILL — Semaglutide Soln Pen-inj 2 MG/DOSE (8 MG/3ML): SUBCUTANEOUS | 84 days supply | Qty: 9 | Fill #0 | Status: AC

## 2022-08-08 ENCOUNTER — Other Ambulatory Visit: Payer: Self-pay

## 2022-08-24 ENCOUNTER — Other Ambulatory Visit: Payer: Self-pay

## 2022-08-24 ENCOUNTER — Other Ambulatory Visit: Payer: Self-pay | Admitting: Endocrinology

## 2022-08-24 DIAGNOSIS — E1165 Type 2 diabetes mellitus with hyperglycemia: Secondary | ICD-10-CM

## 2022-08-24 MED ORDER — METFORMIN HCL ER 500 MG PO TB24
ORAL_TABLET | ORAL | 1 refills | Status: DC
  Filled 2022-08-24: qty 360, 90d supply, fill #0
  Filled 2022-11-21: qty 360, 90d supply, fill #1

## 2022-09-04 ENCOUNTER — Other Ambulatory Visit: Payer: Self-pay | Admitting: Internal Medicine

## 2022-09-04 ENCOUNTER — Other Ambulatory Visit: Payer: Self-pay

## 2022-09-05 ENCOUNTER — Other Ambulatory Visit: Payer: Self-pay

## 2022-09-05 MED ORDER — LOSARTAN POTASSIUM 50 MG PO TABS
50.0000 mg | ORAL_TABLET | Freq: Every day | ORAL | 0 refills | Status: DC
  Filled 2022-09-05: qty 90, 90d supply, fill #0

## 2022-09-05 NOTE — Telephone Encounter (Signed)
Requested Prescriptions  Pending Prescriptions Disp Refills   losartan (COZAAR) 50 MG tablet 90 tablet 0    Sig: Take 1 tablet (50 mg total) by mouth daily.     Cardiovascular:  Angiotensin Receptor Blockers Failed - 09/04/2022  8:31 AM      Failed - Valid encounter within last 6 months    Recent Outpatient Visits           3 months ago Encounter for general adult medical examination with abnormal findings   [place] [facility] [place], Salvadore Oxford, NP   9 months ago Coronary artery disease of native artery of native heart with stable angina pectoris [facility])   [place] [facility] [place], [place], NP   1 year ago Encounter for general adult medical examination with abnormal findings   [place] [facility] [place], Salvadore Oxford, NP       Future Appointments             In 1 month End, Cristal Deer, MD [facility] [place]   In 2 months [place], Salvadore Oxford, NP [place] [facility], [facility]            Passed - Cr in normal range and within 180 days    Creatinine  Date Value Ref Range Status  02/18/2013 0.8 0.6 - 1.1 mg/dL Final   Creat  Date Value Ref Range Status  05/30/2022 0.59 0.50 - 1.03 mg/dL Final   Creatinine,U  Date Value Ref Range Status  06/07/2021 51.9 mg/dL Final   Creatinine, Urine  Date Value Ref Range Status  05/30/2022 89 20 - 275 mg/dL Final         Passed - K in normal range and within 180 days    Potassium  Date Value Ref Range Status  05/30/2022 4.5 3.5 - 5.3 mmol/L Final  02/18/2013 4.4 3.5 - 5.1 mEq/L Final         Passed - Patient is not pregnant      Passed - Last BP in normal range    BP Readings from Last 1 Encounters:  07/20/22 128/80

## 2022-10-02 ENCOUNTER — Other Ambulatory Visit: Payer: Self-pay | Admitting: Endocrinology

## 2022-10-02 ENCOUNTER — Other Ambulatory Visit: Payer: Self-pay

## 2022-10-02 ENCOUNTER — Encounter: Payer: Self-pay | Admitting: Internal Medicine

## 2022-10-02 DIAGNOSIS — E1142 Type 2 diabetes mellitus with diabetic polyneuropathy: Secondary | ICD-10-CM

## 2022-10-02 MED ORDER — FREESTYLE LITE TEST VI STRP
1.0000 | ORAL_STRIP | Freq: Two times a day (BID) | 0 refills | Status: AC
  Filled 2022-10-02 – 2023-05-28 (×3): qty 50, 25d supply, fill #0

## 2022-10-02 MED ORDER — FREESTYLE LIBRE 3 SENSOR MISC
1.0000 | 2 refills | Status: DC
  Filled 2022-10-02: qty 2, 28d supply, fill #0
  Filled 2022-10-30: qty 2, 28d supply, fill #1
  Filled 2022-11-27: qty 2, 28d supply, fill #2

## 2022-10-02 MED FILL — Insulin Pen Needle 31 G X 8 MM (1/3" or 5/16"): 50 days supply | Qty: 100 | Fill #1 | Status: CN

## 2022-10-03 ENCOUNTER — Ambulatory Visit
Admission: RE | Admit: 2022-10-03 | Discharge: 2022-10-03 | Disposition: A | Discharge status: Home / Self Care | Payer: 59 | Source: Ambulatory Visit | Attending: Emergency Medicine | Admitting: Emergency Medicine

## 2022-10-03 ENCOUNTER — Ambulatory Visit (INDEPENDENT_AMBULATORY_CARE_PROVIDER_SITE_OTHER): Payer: 59

## 2022-10-03 ENCOUNTER — Other Ambulatory Visit: Payer: Self-pay

## 2022-10-03 VITALS — BP 147/96 | HR 84 | Temp 99.0°F | Resp 18

## 2022-10-03 DIAGNOSIS — J3489 Other specified disorders of nose and nasal sinuses: Secondary | ICD-10-CM | POA: Diagnosis not present

## 2022-10-03 MED ORDER — DOXYCYCLINE HYCLATE 100 MG PO CAPS
100.0000 mg | ORAL_CAPSULE | Freq: Two times a day (BID) | ORAL | 0 refills | Status: AC
  Filled 2022-10-03: qty 20, 10d supply, fill #0

## 2022-10-03 NOTE — ED Triage Notes (Signed)
Patient to Urgent Care with complaints of left sided sinus pain. Describes burning and itching pain. No fevers.   Reports that she coughed while eating on Sunday and is now having left sided facial pain. Reports she had some food particles (grilled chicken) come out when she blew her nose. Now having clear nasal drainage.   Has been using saline spray.

## 2022-10-03 NOTE — Discharge Instructions (Addendum)
You were evaluated for your sinus pain  At this time do not believe that her x-ray would be able to visualize any food particles and that his symptoms continue to persist she would most likely benefit from CT  Begin doxycyline  every morning and every evening for 10 days  You may take Tylenol as needed for pain  May hold warm compresses to the affected area in 10 to 15-minute intervals  You may follow-up with his urgent care as needed if symptoms continue to persist or worsen

## 2022-10-03 NOTE — ED Provider Notes (Signed)
Stacey Roberts    CSN: [id number] Arrival date & time: 10/03/22  1542      History   Chief Complaint Chief Complaint  Patient presents with   Facial Pain    pain and swelling under left eye/sinus.  coughed while eating on Saturday and now having pain left side of face, food particles did come out after i blew my nose.  I know gross! - Entered by patient    HPI Stacey Roberts is a 57 y.o. female.   Patient presents for evaluation of left-sided pain and swelling underneath the eye along the sinus beginning 2 days ago.  Endorses symptoms began after she coughed while eating causing her to inhale the food particles that then came out of her nose while blowing.  Since has been experiencing discomfort to the sinus described as a constant burning and itching, endorses skin is warm to touch.  Has only noted clear drainage when blowing the nose.  Has attempted use of saline irrigation which has provided no relief.  Denies presence of fever.   Past Medical History:  Diagnosis Date   Allergy    seasonal   BRCA negative 2017   Breast cancer (HCC) 2014   Breast cancer (HCC) 2017   Bronchitis    hx of   Coronary artery disease 06/2019   80% mid RCA stenosis - medical management   Diabetes mellitus, type II (HCC)    Endometrial hyperplasia 2014   Mirena placed; Dr. 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 Sharl Ma    Patient  Active Problem List   Diagnosis Date Noted   Morbid obesity (HCC) 07/20/2022   Sinus tachycardia 04/05/2022   Hypothyroidism 04/05/2022   PSVT (paroxysmal supraventricular tachycardia) 11/24/2020   Coronary artery disease of native artery of native heart with stable angina pectoris (HCC) 10/29/2019   Class 2 obesity due to excess calories with body mass index (BMI) of 35.0 to 35.9 in adult 08/28/2018   GERD (gastroesophageal reflux disease) 07/20/2015   Diabetes (HCC) 04/03/2015   Anxiety and depression 09/09/2013   History of left breast cancer 02/05/2013   Palpitations 02/29/2012   ADENOCARCINOMA, THYROID GLAND, 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   Hyperlipidemia LDL goal <70 12/18/2006   Hypertension associated with type 2 diabetes mellitus (HCC) 12/13/2006    Past Surgical History:  Procedure Laterality Date   APPLICATION OF A-CELL OF EXTREMITY Left 09/29/2020   Procedure: placement of ACell;  Surgeon: Peggye Form, DO;  Location: [facility];  Service: Plastics;  Laterality: Left;   BREAST BIOPSY  2000   s/p   BREAST BIOPSY Left 01/22/2013   Procedure: RE-EXCISION LEFT BREAST DCIS;  Surgeon: Shelly Rubenstein, MD;  Location: [facility];  Service: General;  Laterality: Left;   BREAST BIOPSY Left 2021  BREAST LUMPECTOMY Left 01/06/2013   Procedure: LUMPECTOMY;  Surgeon: Shelly Rubenstein, MD;  Location[facility];  Service: General;  Laterality: Left;   BREAST LUMPECTOMY Left 2017   BREAST LUMPECTOMY WITH NEEDLE LOCALIZATION AND AXILLARY SENTINEL LYMPH NODE BX Left 09/30/2015   Procedure: Left BREAST LUMPECTOMY WITH NEEDLE LOCALIZATION AND AXILLARY SENTINEL LYMPH NODE BX;  Surgeon: [name], MD;  Location: [facility];  Service: General;  Laterality: Left;   BREAST RECONSTRUCTION Bilateral 10/12/2015   Procedure: BREAST oncoplastic RECONSTRUCTION;  Surgeon: [name], MD;  Location: [facility];  Service: Plastics;  Laterality:  Bilateral;   BREAST REDUCTION SURGERY Bilateral 10/12/2015   Procedure: MAMMARY REDUCTION  (BREAST);  Surgeon: [name], MD;  Location: [facility];  Service: Plastics;  Laterality: Bilateral;   CORONARY ANGIOPLASTY     CORONARY STENT INTERVENTION N/A 04/10/2022   Procedure: CORONARY STENT INTERVENTION;  Surgeon: Yvonne Kendall, MD;  [facility];  Service: Cardiovascular;  Laterality: N/A;   DILATION AND CURETTAGE OF UTERUS  2004   s/p   EXCISION OF BREAST LESION Left 10/12/2015   Procedure: Re-EXCISION OF BREAST;  Surgeon: [name], MD;  Location: [facility];  Service: General;  Laterality: Left;   INCISION AND DRAINAGE OF WOUND Left 09/29/2020   Procedure: Excision of back and left breast wound;  Surgeon: Peggye Form, DO;  Location: [facility];  Service: Plastics;  Laterality: Left;  1 hour   LATISSIMUS FLAP TO BREAST Left 07/19/2020   Procedure: Left breast radiation necrosis excision with latissimus muscle flap;  Surgeon: Peggye Form, DO;  Location: [facility];  Service: Plastics;  Laterality: Left;  3 hours   LEFT HEART CATH AND CORONARY ANGIOGRAPHY N/A 06/27/2019   Procedure: LEFT HEART CATH AND CORONARY ANGIOGRAPHY;  Surgeon: Yvonne Kendall, MD;  [facility];  Service: Cardiovascular;  Laterality: N/A;   LEFT HEART CATH AND CORONARY ANGIOGRAPHY N/A 04/10/2022   Procedure: LEFT HEART CATH AND CORONARY ANGIOGRAPHY;  Surgeon: Yvonne Kendall, MD;  [facility];  Service: Cardiovascular;  Laterality: N/A;   REDUCTION MAMMAPLASTY Bilateral 09/2015   THYROIDECTOMY  02/2009   Dr Gerrit Friends   TONSILLECTOMY  1982   TONSILLECTOMY      OB History     Gravida  0   Para  0   Term  0   Preterm  0   AB  0   Living  0      SAB  0   IAB  0   Ectopic  0   Multiple  0   Live Births  0            Home Medications    Prior to Admission medications    Medication Sig Start Date End Date Taking? Authorizing Provider  doxycycline (VIBRAMYCIN) 100 MG capsule Take 1 capsule (100 mg total) by mouth 2 (two) times daily for 10 days. 10/03/22 10/13/22 Yes Valinda Hoar, NP  aspirin EC 81 MG tablet Take 1 tablet (81 mg total) by mouth daily. 05/23/19   End, Cristal Deer, MD  Blood Glucose Monitoring Suppl (FREESTYLE FREEDOM LITE) w/Device KIT test twice daily 11/17/21   Reather Littler, MD  clopidogrel (PLAVIX) 75 MG tablet Take 1 tablet (75 mg total) by mouth daily. 04/11/22   End, Cristal Deer, MD  Continuous Glucose Sensor (FREESTYLE LIBRE 3 SENSOR) MISC 1 Device by Does not apply route every 14 (fourteen) days. Apply 1  sensor on upper arm every 14 days for continuous glucose monitoring 10/02/22   Altamese Plumwood, MD  dapagliflozin propanediol (FARXIGA) 5 MG TABS tablet Take 1 tablet (5 mg total) by mouth daily. 05/25/22   Reather Littler, MD  diltiazem (CARDIZEM CD) 120 MG 24 hr capsule Take 1 capsule (120 mg total) by mouth daily. 04/05/22   End, Cristal Deer, MD  glucose blood (FREESTYLE LITE) test strip Use 2 (two) times daily. (dx [name]) 10/02/22   Motwani, Carin Hock, MD  insulin glargine-yfgn (SEMGLEE) 100 UNIT/ML Pen Inject 45 Units into the skin every morning. 12/30/21   Reather Littler, MD  Insulin Pen Needle (UNIFINE PENTIPS) 31G X 8 MM MISC use to inject twice a day 03/31/22   Reather Littler, MD  Lancets (FREESTYLE) lancets 1 each by Other route 2 (two) times daily. [name] 02/03/19   [name], MD  levothyroxine (SYNTHROID) 112 MCG tablet Take 1 tablet (112 mcg total) by mouth daily. 05/25/22   Reather Littler, MD  losartan (COZAAR) 50 MG tablet Take 1 tablet (50 mg total) by mouth daily. 09/05/22 09/05/23  [name], NP  metFORMIN (GLUCOPHAGE-XR) 500 MG 24 hr tablet TAKE 4 TABLETS BY MOUTH DAILY WITH BREAKFAST 08/24/22 08/24/23  Altamese Glendora, MD  metoprolol tartrate (LOPRESSOR) 100 MG tablet Take 1 tablet (100 mg total) by mouth 2 (two) times daily. 03/16/22 12/31/22  End,  Cristal Deer, MD  Multiple Vitamins-Minerals (MULTIVITAMIN WITH MINERALS) tablet Take 1 tablet by mouth daily. Woman's Gummies    [provider]  nitroGLYCERIN (NITROSTAT) 0.4 MG SL tablet Place 1 tablet (0.4 mg total) under the tongue every 5 (five) minutes as needed for chest pain. 06/27/19   End, Cristal Deer, MD  Semaglutide, 2 MG/DOSE, (OZEMPIC, 2 MG/DOSE,) 8 MG/3ML SOPN Inject 2 mg into the skin once a week. 08/07/22   Motwani, Carin Hock, MD  pantoprazole (PROTONIX) 40 MG tablet TAKE 1 TABLET BY MOUTH DAILY. 11/22/21 11/22/22  [name], NP  pregabalin (LYRICA) 75 MG capsule Take 1 capsule (75 mg total) by mouth daily. 11/28/21   Hyatt, Max T, DPM  rosuvastatin (CRESTOR) 20 MG tablet Take 1 tablet (20 mg total) by mouth daily. 05/02/22 05/02/23  [name], NP    Family History Family History  Problem Relation Age of Onset   CAD Mother 58       Died age 89 of CAD   Heart disease Mother        CABG x 2   Graves' disease Mother    Hypertension Mother    Thyroid disease Mother    Heart attack Mother 20   Dementia Father        deceased age 25 secondary to dementia   Bipolar disorder Father    Emphysema Father    Heart disease Father    COPD Father        smoker   Hypertension Father    Depression Father    Alcoholism Father    Other Sister 24       hysterectomy for fibroids   Heart disease Sister        CABG x 2   Heart attack Sister 52   Prostate cancer Brother        low grade; w/ surveillance   Thyroid nodules Brother 80   Heart disease Brother 56       CABG x 4   CAD Maternal Grandfather 60   Heart disease Maternal Grandfather    Heart attack Maternal Grandfather 15  Kidney cancer Cousin        maternal 1st cousin dx 5-47; former smoker   Fibroids Other        niece dx approx 40   Hypertension Other    Lung cancer Other        maternal great aunt (MGM's sister); not a smoker   Cancer Other        nephew dx neuroblastoma at 73.53 years old   Colon  cancer Neg Hx    Colon polyps Neg Hx     Social History Social History   Tobacco Use   Smoking status: Former    Current packs/day: 0.00    Types: Cigarettes    Quit date: 09/29/2007    Years since quitting: 15.0   Smokeless tobacco: Never   Tobacco comments:    Smoked on and off for 20 years. Quit about 8 years ago (as of 08/2015)  Vaping Use   Vaping status: Never Used  Substance Use Topics   Alcohol use: Not Currently    Alcohol/week: 0.0 standard drinks of alcohol   Drug use: No     Allergies   Ace inhibitors, Penicillins, Clindamycin/lincomycin, and Ciprofloxacin   Review of Systems Review of Systems   Physical Exam Triage Vital Signs ED Triage Vitals  Encounter Vitals Group     BP 10/03/22 1604 (!) 147/96     Systolic BP Percentile --      Diastolic BP Percentile --      Pulse Rate 10/03/22 1604 84     Resp 10/03/22 1604 18     Temp 10/03/22 1604 99 F (37.2 C)     Temp src --      SpO2 10/03/22 1604 98 %     Weight --      Height --      Head Circumference --      Peak Flow --      Pain Score 10/03/22 1600 6     Pain Loc --      Pain Education --      Exclude from Growth Chart --    No data found.  Updated Vital Signs BP (!) 147/96   Pulse 84   Temp 99 F (37.2 C)   Resp 18   LMP 06/24/2010 (Approximate)   SpO2 98%   Visual Acuity Right Eye Distance:   Left Eye Distance:   Bilateral Distance:    Right Eye Near:   Left Eye Near:    Bilateral Near:     Physical Exam Constitutional:      Appearance: Normal appearance.  HENT:     Nose: No congestion or rhinorrhea.     Comments: Tenderness along the left maxillary sinus with mild to moderate swelling, bilateral nares are clear without obstruction Eyes:     Extraocular Movements: Extraocular movements intact.  Pulmonary:     Effort: Pulmonary effort is normal.  Neurological:     Mental Status: She is alert and oriented to person, place, and time.      UC Treatments / Results   Labs (all labs ordered are listed, but only abnormal results are displayed) Labs Reviewed - No data to display  EKG   Radiology No results found.  Procedures Procedures (including critical care time)  Medications Ordered in UC Medications - No data to display  Initial Impression / Assessment and Plan / UC Course  I have reviewed the triage vital signs and the nursing notes.  Pertinent labs & imaging  results that were available during my care of the patient were reviewed by me and considered in my medical decision making (see chart for details).  Pain of maxillary sinus   Sinus x-ray pending, prophylactically placing on antibiotic, cyclin sent to pharmacy, may continue use of saline irrigation as well as Tylenol and warm compresses for comfort, if symptoms continue to persist or worsen will need to follow-up with primary doctor for further evaluation and management  Final Clinical Impressions(s) / UC Diagnoses   Final diagnoses:  Pain of maxillary sinus     Discharge Instructions      You were evaluated for your sinus pain  At this time do not believe that her x-ray would be able to visualize any food particles and that his symptoms continue to persist she would most likely benefit from CT  Begin doxycyline  every morning and every evening for 10 days  You may take Tylenol as needed for pain  May hold warm compresses to the affected area in 10 to 15-minute intervals  You may follow-up with his urgent care as needed if symptoms continue to persist or worsen   ED Prescriptions     Medication Sig Dispense Auth. Provider   doxycycline (VIBRAMYCIN) 100 MG capsule Take 1 capsule (100 mg total) by mouth 2 (two) times daily for 10 days. 20 capsule Valinda Hoar, NP      PDMP not reviewed this encounter.   Valinda Hoar, [place] 10/03/22 [phone]

## 2022-10-30 ENCOUNTER — Other Ambulatory Visit: Payer: Self-pay

## 2022-10-30 ENCOUNTER — Other Ambulatory Visit: Payer: Self-pay | Admitting: Internal Medicine

## 2022-10-30 MED ORDER — CLOPIDOGREL BISULFATE 75 MG PO TABS
75.0000 mg | ORAL_TABLET | Freq: Every day | ORAL | 0 refills | Status: DC
  Filled 2022-10-30: qty 90, 90d supply, fill #0

## 2022-10-31 ENCOUNTER — Other Ambulatory Visit: Payer: Self-pay

## 2022-10-31 MED ORDER — ROSUVASTATIN CALCIUM 20 MG PO TABS
20.0000 mg | ORAL_TABLET | Freq: Every day | ORAL | 1 refills | Status: DC
  Filled 2022-10-31: qty 90, 90d supply, fill #0
  Filled 2023-01-29: qty 90, 90d supply, fill #1

## 2022-10-31 NOTE — Telephone Encounter (Signed)
Requested Prescriptions  Pending Prescriptions Disp Refills   rosuvastatin (CRESTOR) 20 MG tablet 90 tablet 1    Sig: Take 1 tablet (20 mg total) by mouth daily.     Cardiovascular:  Antilipid - Statins 2 Failed - 10/30/2022 12:22 PM      Failed - Lipid Panel in normal range within the last 12 months    Cholesterol, Total  Date Value Ref Range Status  03/21/2017 122 100 - 199 mg/dL Final   Cholesterol  Date Value Ref Range Status  05/30/2022 121 <200 mg/dL Final   LDL Cholesterol (Calc)  Date Value Ref Range Status  05/30/2022 42 mg/dL (calc) Final    Comment:    Reference range: <100 . Desirable range <100 mg/dL for primary prevention;   <70 mg/dL for patients with CHD or diabetic patients  with > or = 2 CHD risk factors. Marland Kitchen LDL-C is now calculated using the Martin-Hopkins  calculation, which is a validated novel method providing  better accuracy than the Friedewald equation in the  estimation of LDL-C.  Horald Pollen et al. Lenox Ahr. 2130;865(78): 2061-2068  (http://education.[employer].com/faq/FAQ164)    Direct LDL  Date Value Ref Range Status  09/01/2014 153.0 mg/dL Final    Comment:    Optimal:  <100 mg/dLNear or Above Optimal:  100-129 mg/dLBorderline High:  130-159 mg/dLHigh:  160-189 mg/dLVery High:  >190 mg/dL   HDL  Date Value Ref Range Status  05/30/2022 58 > OR = 50 mg/dL Final  46/96/2952 41 >84 mg/dL Final   Triglycerides  Date Value Ref Range Status  05/30/2022 133 <150 mg/dL Final         Passed - Cr in normal range and within 360 days    Creatinine  Date Value Ref Range Status  02/18/2013 0.8 0.6 - 1.1 mg/dL Final   Creat  Date Value Ref Range Status  05/30/2022 0.59 0.50 - 1.03 mg/dL Final   Creatinine,U  Date Value Ref Range Status  06/07/2021 51.9 mg/dL Final   Creatinine, Urine  Date Value Ref Range Status  05/30/2022 89 20 - 275 mg/dL Final         Passed - Patient is not pregnant      Passed - Valid encounter within last 12  months    Recent Outpatient Visits           5 months ago Encounter for general adult medical examination with abnormal findings   [place] [facility] [place], Salvadore Oxford, NP   11 months ago Coronary artery disease of native artery of native heart with stable angina pectoris [facility])   [place] [facility] [place], Salvadore Oxford, NP   1 year ago Encounter for general adult medical examination with abnormal findings   [place] [facility] [place], Salvadore Oxford, NP       Future Appointments             In 2 days End, Cristal Deer, MD [facility] [place]   In 1 month [place], Salvadore Oxford, NP [place] [facility], [facility]

## 2022-11-02 ENCOUNTER — Encounter: Payer: Self-pay | Admitting: Internal Medicine

## 2022-11-02 ENCOUNTER — Ambulatory Visit: Payer: 59 | Attending: Internal Medicine | Admitting: Internal Medicine

## 2022-11-02 VITALS — BP 124/80 | HR 84 | Ht 64.0 in | Wt 208.1 lb

## 2022-11-02 DIAGNOSIS — E785 Hyperlipidemia, unspecified: Secondary | ICD-10-CM | POA: Diagnosis not present

## 2022-11-02 DIAGNOSIS — I471 Supraventricular tachycardia, unspecified: Secondary | ICD-10-CM

## 2022-11-02 DIAGNOSIS — I10 Essential (primary) hypertension: Secondary | ICD-10-CM

## 2022-11-02 DIAGNOSIS — E1169 Type 2 diabetes mellitus with other specified complication: Secondary | ICD-10-CM

## 2022-11-02 DIAGNOSIS — I2510 Atherosclerotic heart disease of native coronary artery without angina pectoris: Secondary | ICD-10-CM

## 2022-11-02 NOTE — Progress Notes (Signed)
Cardiology Office Note:  .   Date:  11/02/2022  ID:  Stacey Roberts, DOB 1965/10/02, MRN [id number] PCP: [name], NP  [facility] Providers Cardiologist:  Yvonne Kendall, MD     History of Present Illness: .   Discussed the use of AI scribe software for clinical note transcription with the patient, who gave verbal consent to proceed.  Stacey Roberts is a 57 y.o. female with history of coronary artery disease with PCI to the mid RCA in 03/2022, hypertension, hyperlipidemia, type 2 diabetes mellitus, breast cancer, and prior tobacco use who presents for follow-up of coronary artery disease.  I last saw her in June, at which time she was feeling well other than sporadic flutters and vague chest pain lasting only a few seconds.  She overall felt like she was doing well after her PCI in March.  We did not make any medication changes or pursue additional testing.  Today, [name] reports feeling well overall. She denies any chest pain, shortness of breath, or palpitations. She has been maintaining an active lifestyle, walking frequently and climbing stairs multiple times a day without any difficulty or symptoms of dyspnea. Regarding her medications, [name] confirms adherence to aspirin, clopidogrel (Plavix), and diltiazem, without any noticeable side effects or bleeding. She is due for a routine check-up with her primary care provider next month.     ROS: See HPI  Studies Reviewed: Marland Kitchen   EKG Interpretation Date/Time:  Thursday November 02 2022 08:06:12 EDT Ventricular Rate:  84 PR Interval:  150 QRS Duration:  118 QT Interval:  398 QTC Calculation: 470 R Axis:   55  Text Interpretation: Normal sinus rhythm Incomplete right bundle branch block Borderline ECG When compared with ECG of 18-Apr-2022 No significant change was found Confirmed by Pina Sirianni, Cristal Deer [phone]) on 11/02/2022 8:08:53 AM    Severe single-vessel coronary artery disease with 80-90% proximal/mid RCA and  60% distal RCA lesions, as well as mild-moderate, non-obstructive disease involving the left coronary artery. Normal left ventricular systolic function (LVEF 55-65%) with moderately elevated left ventricular filling pressure (LVEDP 25-30 mmHg). Successful PCI to proximal/mid RCA using overlapping Synergy 2.5 x 8 mm and 2.5 x 24 mm drug-eluting stents with 0% residual stenosis and TIMI-3 flow.  Risk Assessment/Calculations:             Physical Exam:   VS:  BP 124/80 (BP Location: Left Arm, Patient Position: Sitting, Cuff Size: Large)   Pulse 84   Ht 5\' 4"  (1.626 m)   Wt 208 lb 2 oz (94.4 kg)   LMP 06/24/2010 (Approximate)   SpO2 98%   BMI 35.72 kg/m    Wt Readings from Last 3 Encounters:  11/02/22 208 lb 2 oz (94.4 kg)  07/20/22 209 lb (94.8 kg)  07/13/22 208 lb 3.2 oz (94.4 kg)    General:  NAD. Neck: No JVD or HJR. Lungs: Clear to auscultation bilaterally without wheezes or crackles. Heart: Regular rate and rhythm without murmurs, rubs, or gallops. Abdomen: Soft, nontender, nondistended. Extremities: No lower extremity edema.  ASSESSMENT AND PLAN: .    Coronary artery disease without angina: [name] continues to do well following PCI to the RCA in 03/2022.  As she is more than 6 months out from her PCI in the setting of stable ischemic heart disease, we have agreed to discontinue clopidogrel when she has exhausted her current supply.  She will remain on indefinite aspirin 81 mg daily.  Continue secondary prevention with  rosuvastatin 20 mg daily.  Palpitations and PSVT: No symptoms reported since our last visit.  Continue current regimen of diltiazem and metoprolol.  No significant conduction abnormalities on EKG today.  Hypertension: Blood pressure borderline elevated today with DBP of 80 mmHg (goal less than 130/80).  Continue current regimen of diltiazem, losartan, and metoprolol tartrate.  Hyperlipidemia associated with type 2 diabetes mellitus: Lipids  well-controlled on last check in May with total cholesterol 121, HDL 58, and LDL 42.  Continue rosuvastatin 20 mg daily.  Ongoing management of DM per [name].      Dispo: Return to clinic in 6 months.  Signed, Yvonne Kendall, MD

## 2022-11-02 NOTE — Patient Instructions (Signed)
Medication Instructions:  Your physician recommends the following medication changes.  STOP TAKING: Plavix   *If you need a refill on your cardiac medications before your next appointment, please call your pharmacy*   Lab Work: No labs ordered today    Testing/Procedures: No test ordered today    Follow-Up: At [facility], you and your health needs are our priority.  As part of our continuing mission to provide you with exceptional heart care, we have created designated Provider Care Teams.  These Care Teams include your primary Cardiologist (physician) and Advanced Practice Providers (APPs -  Physician Assistants and Nurse Practitioners) who all work together to provide you with the care you need, when you need it.  We recommend signing up for the patient portal called "MyChart".  Sign up information is provided on this After Visit Summary.  MyChart is used to connect with patients for Virtual Visits (Telemedicine).  Patients are able to view lab/test results, encounter notes, upcoming appointments, etc.  Non-urgent messages can be sent to your provider as well.   To learn more about what you can do with MyChart, go to [email or web address].    Your next appointment:   6 month(s)  Provider:   You may see Yvonne Kendall, MD or one of the following Advanced Practice Providers on your designated Care Team:   Nicolasa Ducking, NP Eula Listen, PA-C Cadence Fransico Michael, PA-C Charlsie Quest, NP

## 2022-11-04 ENCOUNTER — Other Ambulatory Visit: Payer: Self-pay

## 2022-11-05 ENCOUNTER — Other Ambulatory Visit: Payer: Self-pay | Admitting: "Endocrinology

## 2022-11-05 ENCOUNTER — Other Ambulatory Visit: Payer: Self-pay

## 2022-11-06 ENCOUNTER — Other Ambulatory Visit: Payer: Self-pay

## 2022-11-06 MED FILL — Semaglutide Soln Pen-inj 2 MG/DOSE (8 MG/3ML): SUBCUTANEOUS | 84 days supply | Qty: 9 | Fill #0 | Status: AC

## 2022-11-08 DIAGNOSIS — M9903 Segmental and somatic dysfunction of lumbar region: Secondary | ICD-10-CM | POA: Diagnosis not present

## 2022-11-08 DIAGNOSIS — M9901 Segmental and somatic dysfunction of cervical region: Secondary | ICD-10-CM | POA: Diagnosis not present

## 2022-11-08 DIAGNOSIS — R519 Headache, unspecified: Secondary | ICD-10-CM | POA: Diagnosis not present

## 2022-11-08 DIAGNOSIS — M62830 Muscle spasm of back: Secondary | ICD-10-CM | POA: Diagnosis not present

## 2022-11-09 ENCOUNTER — Other Ambulatory Visit (HOSPITAL_BASED_OUTPATIENT_CLINIC_OR_DEPARTMENT_OTHER): Payer: Self-pay

## 2022-11-14 ENCOUNTER — Ambulatory Visit: Payer: 59 | Admitting: "Endocrinology

## 2022-11-14 ENCOUNTER — Other Ambulatory Visit: Payer: Self-pay

## 2022-11-14 ENCOUNTER — Encounter: Payer: Self-pay | Admitting: "Endocrinology

## 2022-11-14 VITALS — BP 150/92 | HR 93 | Ht 64.0 in | Wt 207.2 lb

## 2022-11-14 DIAGNOSIS — E1159 Type 2 diabetes mellitus with other circulatory complications: Secondary | ICD-10-CM | POA: Diagnosis not present

## 2022-11-14 DIAGNOSIS — Z7984 Long term (current) use of oral hypoglycemic drugs: Secondary | ICD-10-CM | POA: Diagnosis not present

## 2022-11-14 DIAGNOSIS — E1165 Type 2 diabetes mellitus with hyperglycemia: Secondary | ICD-10-CM | POA: Diagnosis not present

## 2022-11-14 DIAGNOSIS — Z794 Long term (current) use of insulin: Secondary | ICD-10-CM

## 2022-11-14 DIAGNOSIS — Z7985 Long-term (current) use of injectable non-insulin antidiabetic drugs: Secondary | ICD-10-CM

## 2022-11-14 DIAGNOSIS — E7800 Pure hypercholesterolemia, unspecified: Secondary | ICD-10-CM | POA: Diagnosis not present

## 2022-11-14 LAB — POCT GLYCOSYLATED HEMOGLOBIN (HGB A1C): Hemoglobin A1C: 6.3 % — AB (ref 4.0–5.6)

## 2022-11-14 MED ORDER — XIGDUO XR 2.5-1000 MG PO TB24
2.0000 | ORAL_TABLET | Freq: Every day | ORAL | 1 refills | Status: DC
  Filled 2022-11-14: qty 90, 45d supply, fill #0
  Filled 2023-01-22: qty 90, 45d supply, fill #1
  Filled 2023-01-23: qty 60, 30d supply, fill #1
  Filled 2023-02-15: qty 60, 30d supply, fill #2

## 2022-11-14 NOTE — Patient Instructions (Signed)

## 2022-11-14 NOTE — Progress Notes (Signed)
Outpatient Endocrinology Note Altamese Chadron, MD  11/14/22   Stacey Roberts August 18, 1965 [id number]  Referring Provider: [name], NP Primary Care Provider: [name], NP Reason for consultation: Subjective   Assessment & Plan  Diagnoses and all orders for this visit:  Type 2 diabetes mellitus with vascular disease (HCC) -     POCT glycosylated hemoglobin (Hb A1C) -     Dapagliflozin Pro-metFORMIN ER (XIGDUO XR) 2.05-998 MG TB24; Take 2 tablets by mouth daily.  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  Lab Results  Component Value Date   GFR 100.66 08/11/2021   Hba1c goal less than 7, current Hba1c is  Lab Results  Component Value Date   HGBA1C 6.3 (A) 11/14/2022   Will recommend the following: Ozempic 2mg  weekly Switch metformin ER 2 g daily and Farxiga 5 mg daily to Xigduo XR 2.05/998 mg BID Semglee 45 units bioequivalent glargine in the morning daily    Stopped Farxiga 10 mg every day due to yeast infection    No known contraindications to any of above medications  -Last LD and Tg are as follows: Lab Results  Component Value Date   LDLCALC 42 05/30/2022    Lab Results  Component Value Date   TRIG 133 05/30/2022   -on rosuvastatin 20 mg QD -Follow low fat diet and exercise   -Blood pressure goal <140/90 - Microalbumin/creatinine goal < 30 -Last MA/Cr is as follows: Lab Results  Component Value Date   MICROALBUR 0.7 05/30/2022   -on ACE/ARB losartan 50 mg every day  -diet changes including salt restriction -limit eating outside -counseled BP targets per standards of diabetes care -uncontrolled blood pressure can lead to retinopathy, nephropathy and cardiovascular and atherosclerotic heart disease  Reviewed and counseled on: -A1C target -Blood  sugar targets -Complications of uncontrolled diabetes  -Checking blood sugar before meals and bedtime and bring log next visit -All medications with mechanism of action and side effects -Hypoglycemia management: rule of 15's, Glucagon Emergency Kit and medical alert ID -low-carb low-fat plate-method diet -At least 20 minutes of physical activity per day -Annual dilated retinal eye exam and foot exam -compliance and follow up needs -follow up as scheduled or earlier if problem gets worse  Call if blood sugar is less than 70 or consistently above 250    Take a 15 gm snack of carbohydrate at bedtime before you go to sleep if your blood sugar is less than 100.    If you are going to fast after midnight for a test or procedure, ask your physician for instructions on how to reduce/decrease your insulin dose.    Call if blood sugar is less than 70 or consistently above 250  -Treating a low sugar by rule of 15  (15 gms of sugar every 15 min until sugar is more than 70) If you feel your sugar is low, test your sugar to be sure If your sugar is low (less than 70), then take 15 grams of a fast acting Carbohydrate (3-4 glucose tablets or glucose gel or 4 ounces of juice or regular soda) Recheck your sugar 15 min after treating low to make sure it is more than 70 If sugar is still less than 70, treat again with 15 grams of carbohydrate  Don't drive the hour of hypoglycemia  If unconscious/unable to eat or drink by mouth, use glucagon injection or nasal spray baqsimi and call 911. Can repeat again in 15 min if still unconscious.  Return in about 4 months (around 03/17/2023).   I have reviewed current medications, nurse's notes, allergies, vital signs, past medical and surgical history, family medical history, and social history for this encounter. Counseled patient on symptoms, examination findings, lab findings, imaging results, treatment decisions and monitoring and prognosis. The patient  understood the recommendations and agrees with the treatment plan. All questions regarding treatment plan were fully answered.  Altamese Worden, MD  11/14/22    History of Present Illness Stacey Roberts is a 57 y.o. year old female who presents for evaluation of Type 2 diabetes mellitus.  Stacey Roberts was first diagnosed in 2008.    Home diabetes regimen: Ozempic 2mg  weekly, metformin ER 2 g daily, Farxiga 5 mg daily   Semglee 45 units bioequivalent glargine in the morning daily           Side effects from medications: Yeast infection from 10 mg Farxiga, nausea from Trulicity  Previous history: She has been on insulin since 2017 Previous A1c range = 7.3-10.4 She was started on Ozempic in 2021 and this has been increased periodically and her last dose of 2 mg was started in 2/23  COMPLICATIONS -  Stroke, CAD s/p cardiac stent -  retinopathy -  neuropathy -  nephropathy  BLOOD SUGAR DATA  CGM interpretation: At today's visit, we reviewed her CGM downloads. The full report is scanned in the media. Reviewing the CGM trends, BG are well controlled with rare lows.  Physical Exam  BP (!) 150/92   Pulse 93   Ht 5\' 4"  (1.626 m)   Wt 207 lb 3.2 oz (94 kg)   LMP 06/24/2010 (Approximate)   SpO2 97%   BMI 35.57 kg/m    Constitutional: well developed, well nourished Head: normocephalic, atraumatic Eyes: sclera anicteric, no redness Neck: supple Lungs: normal respiratory effort Neurology: alert and oriented Skin: dry, no appreciable rashes Musculoskeletal: no appreciable defects Psychiatric: normal mood and affect Diabetic Foot Exam - Simple   No data filed      Current Medications Patient's Medications  New Prescriptions   DAPAGLIFLOZIN PRO-METFORMIN ER (XIGDUO XR) 2.05-998 MG TB24    Take 2 tablets by mouth daily.  Previous Medications   ASPIRIN EC 81 MG TABLET    Take 1 tablet (81 mg total) by mouth daily.   BLOOD GLUCOSE MONITORING SUPPL (FREESTYLE FREEDOM LITE)  W/DEVICE KIT    test twice daily   CONTINUOUS GLUCOSE SENSOR (FREESTYLE LIBRE 3 SENSOR) MISC    1 Device by Does not apply route every 14 (fourteen) days. Apply 1 sensor on upper arm every 14 days for continuous glucose monitoring   DAPAGLIFLOZIN PROPANEDIOL (FARXIGA) 5 MG TABS TABLET    Take 1 tablet (5 mg total) by mouth daily.   DILTIAZEM (CARDIZEM CD) 120 MG 24 HR CAPSULE    Take 1 capsule (120 mg total) by mouth daily.   GLUCOSE BLOOD (FREESTYLE LITE) TEST STRIP    Use 2 (two) times daily. (dx [name])   INSULIN GLARGINE-YFGN (SEMGLEE) 100 UNIT/ML PEN    Inject 45 Units into the skin every morning.   INSULIN PEN NEEDLE (UNIFINE PENTIPS) 31G X 8 MM MISC    use to inject twice a day   LANCETS (FREESTYLE) LANCETS    1 each by Other  route 2 (two) times daily. [name]   LEVOTHYROXINE (SYNTHROID) 112 MCG TABLET    Take 1 tablet (112 mcg total) by mouth daily.   LOSARTAN (COZAAR) 50 MG TABLET    Take 1 tablet (50 mg total) by mouth daily.   METFORMIN (GLUCOPHAGE-XR) 500 MG 24 HR TABLET    TAKE 4 TABLETS BY MOUTH DAILY WITH BREAKFAST   METOPROLOL TARTRATE (LOPRESSOR) 100 MG TABLET    Take 1 tablet (100 mg total) by mouth 2 (two) times daily.   MULTIPLE VITAMINS-MINERALS (MULTIVITAMIN WITH MINERALS) TABLET    Take 1 tablet by mouth daily. Woman's Gummies   NITROGLYCERIN (NITROSTAT) 0.4 MG SL TABLET    Place 1 tablet (0.4 mg total) under the tongue every 5 (five) minutes as needed for chest pain.   PANTOPRAZOLE (PROTONIX) 40 MG TABLET    TAKE 1 TABLET BY MOUTH DAILY.   PREGABALIN (LYRICA) 75 MG CAPSULE    Take 1 capsule (75 mg total) by mouth daily.   ROSUVASTATIN (CRESTOR) 20 MG TABLET    Take 1 tablet (20 mg total) by mouth daily.   SEMAGLUTIDE, 2 MG/DOSE, (OZEMPIC, 2 MG/DOSE,) 8 MG/3ML SOPN    Inject 2 mg into the skin once a week.  Modified Medications   No medications on file  Discontinued Medications   No medications on file    Allergies Allergies  Allergen Reactions   Ace Inhibitors Cough     REACTION: dry cough   Penicillins Shortness Of Breath and Rash    Pt has tolerated Cephalexin in 2017 and again in 2018.   Has patient had a PCN reaction causing immediate rash, facial/tongue/throat swelling, SOB or lightheadedness with hypotension: Yes Has patient had a PCN reaction causing severe rash involving mucus membranes or skin necrosis: Yes Has patient had a PCN reaction that required hospitalization No Has patient had a PCN reaction occurring within the last 10 years: No If all of the above answers are "NO", then may proceed with Cephalosporin use.    Clindamycin/Lincomycin Other (See Comments)    Severe stomach cramps   Ciprofloxacin Itching    Past Medical History Past Medical History:  Diagnosis Date   Allergy    seasonal   BRCA negative 2017   Breast cancer (HCC) 2014   Breast cancer (HCC) 2017   Bronchitis    hx of   Coronary artery disease 06/2019   80% mid RCA stenosis - medical management   Diabetes mellitus, type II (HCC)    Endometrial hyperplasia 2014   Mirena placed; Dr. 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 Sharl Ma    Past Surgical History Past Surgical History:  Procedure Laterality Date   APPLICATION OF A-CELL OF EXTREMITY Left 09/29/2020   Procedure: placement of ACell;  Surgeon: Peggye Form, DO;  Location:  [facility];  Service: Plastics;  Laterality: Left;   BREAST  BIOPSY  2000   s/p   BREAST BIOPSY Left 01/22/2013   Procedure: RE-EXCISION LEFT BREAST DCIS;  Surgeon: Shelly Rubenstein, MD;  Location: [facility];  Service: General;  Laterality: Left;   BREAST BIOPSY Left 2021   BREAST LUMPECTOMY Left 01/06/2013   Procedure: LUMPECTOMY;  Surgeon: Shelly Rubenstein, MD;  Location: [facility];  Service: General;  Laterality: Left;   BREAST LUMPECTOMY Left 2017   BREAST LUMPECTOMY WITH NEEDLE LOCALIZATION AND AXILLARY SENTINEL LYMPH NODE BX Left 09/30/2015   Procedure: Left BREAST LUMPECTOMY WITH NEEDLE LOCALIZATION AND AXILLARY SENTINEL LYMPH NODE BX;  Surgeon: [name], MD;  Location: [facility];  Service: General;  Laterality: Left;   BREAST RECONSTRUCTION Bilateral 10/12/2015   Procedure: BREAST oncoplastic RECONSTRUCTION;  Surgeon: [name], MD;  Location: [facility];  Service: Plastics;  Laterality: Bilateral;   BREAST REDUCTION SURGERY Bilateral 10/12/2015   Procedure: MAMMARY REDUCTION  (BREAST);  Surgeon: [name], MD;  Location: [facility];  Service: Plastics;  Laterality: Bilateral;   CORONARY ANGIOPLASTY     CORONARY STENT INTERVENTION N/A 04/10/2022   Procedure: CORONARY STENT INTERVENTION;  Surgeon: Yvonne Kendall, MD;  Location: [facility];  Service: Cardiovascular;  Laterality: N/A;   DILATION AND CURETTAGE OF UTERUS  2004   s/p   EXCISION OF BREAST LESION Left 10/12/2015   Procedure: Re-EXCISION OF BREAST;  Surgeon: [name], MD;  Location: [facility];  Service: General;  Laterality: Left;   INCISION AND DRAINAGE OF WOUND Left 09/29/2020   Procedure: Excision of back and left breast wound;  Surgeon: Peggye Form, DO;  Location: [facility];  Service: Plastics;  Laterality: Left;  1 hour   LATISSIMUS FLAP TO BREAST Left 07/19/2020   Procedure: Left breast radiation necrosis excision with latissimus muscle flap;  Surgeon:  Peggye Form, DO;  Location: [facility];  Service: Plastics;  Laterality: Left;  3 hours   LEFT HEART CATH AND CORONARY ANGIOGRAPHY N/A 06/27/2019   Procedure: LEFT HEART CATH AND CORONARY ANGIOGRAPHY;  Surgeon: Yvonne Kendall, MD;  Location: [facility];  Service: Cardiovascular;  Laterality: N/A;   LEFT HEART CATH AND CORONARY ANGIOGRAPHY N/A 04/10/2022   Procedure: LEFT HEART CATH AND CORONARY ANGIOGRAPHY;  Surgeon: Yvonne Kendall, MD;  Location: [facility];  Service: Cardiovascular;  Laterality: N/A;   REDUCTION MAMMAPLASTY Bilateral 09/2015   THYROIDECTOMY  02/2009   Dr Gerrit Friends   TONSILLECTOMY  1982   TONSILLECTOMY      Family History family history includes Alcoholism in her father; Bipolar disorder in her father; CAD (age of onset: 75) in her mother; CAD (age of onset: 60) in her maternal grandfather; COPD in her father; Cancer in an other family member; Dementia in her father; Depression in her father; Emphysema in her father; Fibroids in an other family member; Luiz Blare' disease in her mother; Heart attack (age of onset: 39) in her mother; Heart attack (age of onset: 85) in her sister; Heart attack (age of onset: 68) in her maternal grandfather; Heart disease in her father, maternal grandfather, mother, and sister; Heart disease (age of onset: 59) in her brother; Hypertension in her father, mother, and another family member; Kidney cancer in her cousin; Lung cancer in an other family member; Other (age of onset: 47) in her sister; Prostate cancer in her brother; Thyroid disease  in her mother; Thyroid nodules (age of onset: 98) in her brother.  Social History Social History   Socioeconomic History   Marital status: Single    Spouse name: Not on file   Number of children: Not on file   Years of education: Not on file   Highest education level: Not on file  Occupational History    Employer: [employer]    Comment: Outpatient scheduling in radiology  Tobacco Use    Smoking status: Former    Current packs/day: 0.00    Types: Cigarettes    Quit date: 09/29/2007    Years since quitting: 15.1   Smokeless tobacco: Never   Tobacco comments:    Smoked on and off for 20 years. Quit about 8 years ago (as of 08/2015)  Vaping Use   Vaping status: Never Used  Substance and Sexual Activity   Alcohol use: Not Currently    Alcohol/week: 0.0 standard drinks of alcohol   Drug use: No   Sexual activity: Not Currently    Birth control/protection: Post-menopausal  Other Topics Concern   Not on file  Social History Narrative   The patient is divorced, moved to [place] in 2006 from [place].  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  No alcohol.  Tobacco use:  She quit 5 years ago.  She smoked onand off for approximately 20 years.  No history of recreational drugUse. Drinks two cups of coffee per work day.    Social Determinants of Health   Financial Resource Strain: Not on file  Food Insecurity: Not on file  Transportation Needs: Not on file  Physical Activity: Not on file  Stress: Not on file  Social Connections: Not on file  Intimate Partner Violence: Not on file    Lab Results  Component Value Date   HGBA1C 6.3 (A) 11/14/2022   HGBA1C 6.6 (A) 07/13/2022   HGBA1C 6.5 (A) 03/21/2022   Lab Results  Component Value Date   CHOL 121 05/30/2022   Lab Results  Component Value Date   HDL 58 05/30/2022   Lab Results  Component Value Date   LDLCALC 42 05/30/2022   Lab Results  Component Value Date   TRIG 133 05/30/2022   Lab Results  Component Value Date   CHOLHDL 2.1 05/30/2022   Lab Results  Component Value Date   CREATININE 0.59 05/30/2022   Lab Results  Component Value Date   GFR 100.66 08/11/2021   Lab Results  Component Value Date   MICROALBUR 0.7 05/30/2022      Component Value Date/Time   NA 140 05/30/2022 1539   NA 138 03/21/2017 1018    NA 137 02/18/2013 1554   K 4.5 05/30/2022 1539   K 4.4 02/18/2013 1554   CL 104 05/30/2022 1539   CO2 25 05/30/2022 1539   CO2 25 02/18/2013 1554   GLUCOSE 96 05/30/2022 1539   GLUCOSE 300 (H) 02/18/2013 1554   BUN 14 05/30/2022 1539   BUN 16 03/21/2017 1018   BUN 14.2 02/18/2013 1554   CREATININE 0.59 05/30/2022 1539   CREATININE 0.8 02/18/2013 1554   CALCIUM 9.7 05/30/2022 1539   CALCIUM 9.2 02/18/2013 1554   PROT 7.0 05/30/2022 1539   PROT 6.5 03/21/2017 1018   PROT 7.2 02/18/2013 1554   ALBUMIN 3.8 05/19/2021 0845   ALBUMIN 3.7 03/21/2017 1018   ALBUMIN 3.6 02/18/2013 1554   AST 9 (L)  05/30/2022 1539   AST 14 (L) 05/19/2021 0845   AST 10 02/18/2013 1554   ALT 8 05/30/2022 1539   ALT 15 05/19/2021 0845   ALT 16 02/18/2013 1554   ALKPHOS 65 05/19/2021 0845   ALKPHOS 85 02/18/2013 1554   BILITOT 0.4 05/30/2022 1539   BILITOT 0.4 05/19/2021 0845   BILITOT 0.33 02/18/2013 1554   GFRNONAA >60 04/05/2022 1752   GFRNONAA >60 05/19/2021 0845   GFRAA >60 08/28/2019 0852   GFRAA >60 07/16/2017 1442      Latest Ref Rng & Units 05/30/2022    3:39 PM 04/05/2022    5:52 PM 11/22/2021    3:43 PM  BMP  Glucose 65 - 139 mg/dL 96  811  93   BUN 7 - 25 mg/dL 14  16  15    Creatinine 0.50 - 1.03 mg/dL 9.14  7.82  9.56   BUN/Creat Ratio 6 - 22 (calc) SEE NOTE:   SEE NOTE:   Sodium 135 - 146 mmol/L 140  140  139   Potassium 3.5 - 5.3 mmol/L 4.5  4.6  4.2   Chloride 98 - 110 mmol/L 104  104  102   CO2 20 - 32 mmol/L 25  26  25    Calcium 8.6 - 10.4 mg/dL 9.7  9.5  9.2        Component Value Date/Time   WBC 9.5 05/30/2022 1539   RBC 4.67 05/30/2022 1539   HGB 12.6 05/30/2022 1539   HGB 13.1 05/19/2021 0845   HGB 13.5 03/21/2017 1018   HGB 12.5 02/18/2013 1554   HCT 38.9 05/30/2022 1539   HCT 42.0 03/21/2017 1018   HCT 39.6 02/18/2013 1554   PLT 321 05/30/2022 1539   PLT 250 05/19/2021 0845   PLT 306 02/18/2013 1554   MCV 83.3 05/30/2022 1539   MCV 86 03/21/2017 1018   MCV  82.6 02/18/2013 1554   MCH 27.0 05/30/2022 1539   MCHC 32.4 05/30/2022 1539   RDW 13.9 05/30/2022 1539   RDW 14.6 03/21/2017 1018   RDW 16.4 (H) 02/18/2013 1554   LYMPHSABS 1.4 05/19/2021 0845   LYMPHSABS 1.5 03/21/2017 1018   LYMPHSABS 2.0 02/18/2013 1554   MONOABS 0.5 05/19/2021 0845   MONOABS 0.6 02/18/2013 1554   EOSABS 0.2 05/19/2021 0845   EOSABS 0.1 03/21/2017 1018   BASOSABS 0.0 05/19/2021 0845   BASOSABS 0.0 03/21/2017 1018   BASOSABS 0.0 02/18/2013 1554     Parts of this note may have been dictated using voice recognition software. There may be variances in spelling and vocabulary which are unintentional. Not all errors are proofread. Please notify the Thereasa Parkin if any discrepancies are noted or if the meaning of any statement is not clear.

## 2022-11-15 ENCOUNTER — Other Ambulatory Visit: Payer: Self-pay

## 2022-11-22 ENCOUNTER — Other Ambulatory Visit: Payer: Self-pay

## 2022-11-27 ENCOUNTER — Other Ambulatory Visit: Payer: Self-pay | Admitting: Podiatry

## 2022-11-27 ENCOUNTER — Other Ambulatory Visit: Payer: Self-pay

## 2022-11-27 MED ORDER — PREGABALIN 75 MG PO CAPS: 75.0000 mg | ORAL_CAPSULE | Freq: Every day | ORAL | 0 refills | Status: DC

## 2022-11-30 ENCOUNTER — Ambulatory Visit
Admission: RE | Admit: 2022-11-30 | Discharge: 2022-11-30 | Disposition: A | Discharge status: Home / Self Care | Payer: 59 | Attending: Internal Medicine | Admitting: Internal Medicine

## 2022-11-30 ENCOUNTER — Ambulatory Visit (INDEPENDENT_AMBULATORY_CARE_PROVIDER_SITE_OTHER): Payer: 59 | Admitting: Internal Medicine

## 2022-11-30 ENCOUNTER — Ambulatory Visit
Admission: RE | Admit: 2022-11-30 | Discharge: 2022-11-30 | Disposition: A | Discharge status: Home / Self Care | Payer: 59 | Source: Ambulatory Visit | Attending: Internal Medicine | Admitting: Internal Medicine

## 2022-11-30 ENCOUNTER — Encounter: Payer: Self-pay | Admitting: Internal Medicine

## 2022-11-30 ENCOUNTER — Other Ambulatory Visit: Payer: Self-pay | Admitting: Internal Medicine

## 2022-11-30 VITALS — BP 138/78 | Ht 64.0 in | Wt 210.0 lb

## 2022-11-30 DIAGNOSIS — E1159 Type 2 diabetes mellitus with other circulatory complications: Secondary | ICD-10-CM

## 2022-11-30 DIAGNOSIS — E1169 Type 2 diabetes mellitus with other specified complication: Secondary | ICD-10-CM

## 2022-11-30 DIAGNOSIS — Z6836 Body mass index (BMI) 36.0-36.9, adult: Secondary | ICD-10-CM

## 2022-11-30 DIAGNOSIS — K219 Gastro-esophageal reflux disease without esophagitis: Secondary | ICD-10-CM | POA: Diagnosis not present

## 2022-11-30 DIAGNOSIS — F32A Depression, unspecified: Secondary | ICD-10-CM

## 2022-11-30 DIAGNOSIS — E1142 Type 2 diabetes mellitus with diabetic polyneuropathy: Secondary | ICD-10-CM | POA: Diagnosis not present

## 2022-11-30 DIAGNOSIS — I471 Supraventricular tachycardia, unspecified: Secondary | ICD-10-CM

## 2022-11-30 DIAGNOSIS — G8929 Other chronic pain: Secondary | ICD-10-CM

## 2022-11-30 DIAGNOSIS — I25118 Atherosclerotic heart disease of native coronary artery with other forms of angina pectoris: Secondary | ICD-10-CM

## 2022-11-30 DIAGNOSIS — I152 Hypertension secondary to endocrine disorders: Secondary | ICD-10-CM

## 2022-11-30 DIAGNOSIS — E039 Hypothyroidism, unspecified: Secondary | ICD-10-CM | POA: Diagnosis not present

## 2022-11-30 DIAGNOSIS — E66812 Obesity, class 2: Secondary | ICD-10-CM

## 2022-11-30 DIAGNOSIS — F419 Anxiety disorder, unspecified: Secondary | ICD-10-CM

## 2022-11-30 DIAGNOSIS — Z853 Personal history of malignant neoplasm of breast: Secondary | ICD-10-CM

## 2022-11-30 DIAGNOSIS — M25512 Pain in left shoulder: Secondary | ICD-10-CM | POA: Diagnosis not present

## 2022-11-30 DIAGNOSIS — Z85850 Personal history of malignant neoplasm of thyroid: Secondary | ICD-10-CM

## 2022-11-30 DIAGNOSIS — E785 Hyperlipidemia, unspecified: Secondary | ICD-10-CM

## 2022-11-30 NOTE — Assessment & Plan Note (Signed)
TSH and free T4 today We will adjust levothyroxine if needed based on labs 

## 2022-11-30 NOTE — Assessment & Plan Note (Signed)
Encouraged her to avoid foods that trigger her reflux Encourage weight loss as this can help reduce reflux symptoms Discussed weaning pantoprazole to 20 mg daily however we will hold off until she is ready to make this change

## 2022-11-30 NOTE — Assessment & Plan Note (Signed)
Recent A1c reviewed Encouraged low-carb diet and exercise for weight loss Urine microalbumin has been checked within the last year We will start to wean Semglee by 1 unit every 3 days as long as fasting sugars are <120 Continue metformin, Farxiga and Ozempic at this time Encouraged her to schedule an eye exam Encouraged routine foot exam

## 2022-11-30 NOTE — Assessment & Plan Note (Signed)
Stable off meds We will monitor

## 2022-11-30 NOTE — Assessment & Plan Note (Signed)
Controlled on metoprolol She will continue to follow with cardiology

## 2022-11-30 NOTE — Progress Notes (Signed)
Subjective:    Patient ID: Stacey Roberts, female    DOB: [birth year], 57 y.o.   MRN: [id number]  HPI  Patient presents to clinic today for 30-month follow-up of chronic conditions.  History of breast cancer: Status postlumpectomy, radiation, reduction and reconstruction.  She follows with oncology.  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  She is taking farxiga, metformin, ozempic and semglee as prescribed.  She is taking pregabalin for neuropathic pain.  Her sugars range 60-208.  She follows with podiatry and endocrinology.  Her last eye exam was 10/2021.  Flu 10/2022.  Pneumovax 2007.  COVID x 2.  GERD: Triggered by "everything I eat".  She denies breakthrough on pantoprazole.  There is no upper GI on file.  HLD with CAD: s/p stent.Her last LDL was 42, triglycerides 621, 05/2022.  She denies myalgias on rosuvastatin. She is taking aspirin and plavix as well. She tries to consume a low-fat diet.  She follows with cardiology.  HTN/PSVT: Her BP today is 152/72.  She is taking diltiazem, losartan and metoprolol as prescribed.  ECG from 10/2022 reviewed.  Hypothyroidism: Postsurgical secondary to thyroid cancer.  She denies any issues on her current dose of levothyroxine.  She follows with endocrinology.  Anxiety and depression: Chronic however she is not currently taking any medications for this.  She is not seeing a therapist.  She denies SI/HI.  Review of Systems  Past Medical History:  Diagnosis Date   Allergy    seasonal   BRCA negative 2017   Breast cancer (HCC) 2014   Breast cancer (HCC) 2017   Bronchitis    hx of   Coronary artery disease 06/2019   80% mid RCA stenosis - medical management   Diabetes mellitus, type II (HCC)    Endometrial hyperplasia 2014   Mirena placed; Dr. 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 Sharl Ma    Current Outpatient Medications  Medication Sig Dispense Refill   aspirin EC 81 MG tablet Take 1 tablet (81 mg total) by mouth daily. 90 tablet 3   Blood Glucose Monitoring Suppl (FREESTYLE FREEDOM LITE) w/Device KIT test twice daily (Patient not taking: Reported on 11/14/2022) 1 kit 0   Continuous Glucose Sensor (FREESTYLE LIBRE 3 SENSOR) MISC 1 Device by Does not apply route every 14 (fourteen) days. Apply 1 sensor on upper arm every 14 days for continuous glucose monitoring 2 each 2   Dapagliflozin Pro-metFORMIN ER (XIGDUO XR) 2.05-998 MG TB24 Take 2 tablets by mouth daily. 90 tablet 1   dapagliflozin propanediol (FARXIGA) 5 MG TABS tablet Take 1 tablet (5 mg total) by mouth daily. 90 tablet 3   diltiazem (CARDIZEM CD) 120 MG 24 hr capsule Take 1 capsule (120 mg total) by mouth daily. 90 capsule 3   glucose  blood (FREESTYLE LITE) test strip Use 2 (two) times daily. (dx [name]) (Patient not taking: Reported on 11/14/2022) 50 each 0   insulin glargine-yfgn (SEMGLEE) 100 UNIT/ML Pen Inject 45 Units into the skin every morning. 45 mL 3   Insulin Pen Needle (UNIFINE PENTIPS) 31G X 8 MM MISC use to inject twice a day 100 each 3   Lancets (FREESTYLE) lancets 1 each by Other route 2 (two) times daily. [name] 200 each 0   levothyroxine (SYNTHROID) 112 MCG tablet Take 1 tablet (112 mcg total) by mouth daily. 90 tablet 3   losartan (COZAAR) 50 MG tablet Take 1 tablet (50 mg total) by mouth daily. 90 tablet 0   metFORMIN (GLUCOPHAGE-XR) 500 MG 24 hr tablet TAKE 4 TABLETS BY MOUTH DAILY  WITH BREAKFAST 360 tablet 1   metoprolol tartrate (LOPRESSOR) 100 MG tablet Take 1 tablet (100 mg total) by mouth 2 (two) times daily. 180 tablet 2   Multiple Vitamins-Minerals (MULTIVITAMIN WITH MINERALS) tablet Take 1 tablet by mouth daily. Woman's Gummies     nitroGLYCERIN (NITROSTAT) 0.4 MG SL tablet Place 1 tablet (0.4 mg total) under the tongue every 5 (five) minutes as needed for chest pain. 25 tablet prn   pantoprazole (PROTONIX) 40 MG tablet TAKE 1 TABLET BY MOUTH DAILY. 30 tablet 11   pregabalin (LYRICA) 75 MG capsule Take 1 capsule (75 mg total) by mouth daily. 90 capsule 0   rosuvastatin (CRESTOR) 20 MG tablet Take 1 tablet (20 mg total) by mouth daily. 90 tablet 1   Semaglutide, 2 MG/DOSE, (OZEMPIC, 2 MG/DOSE,) 8 MG/3ML SOPN Inject 2 mg into the skin once a week. 9 mL 0   No current facility-administered medications for this visit.    Allergies  Allergen Reactions   Ace Inhibitors Cough    REACTION: dry cough   Penicillins Shortness Of Breath and Rash    Pt has tolerated Cephalexin in 2017 and again in 2018.   Has patient had a PCN reaction causing immediate rash, facial/tongue/throat swelling, SOB or lightheadedness with hypotension: Yes Has patient had a PCN reaction causing severe rash involving mucus membranes or skin necrosis: Yes Has patient had a PCN reaction that required hospitalization No Has patient had a PCN reaction occurring within the last 10 years: No If all of the above answers are "NO", then may proceed with Cephalosporin use.    Clindamycin/Lincomycin Other (See Comments)    Severe stomach cramps   Ciprofloxacin Itching    Family History  Problem Relation Age of Onset   CAD Mother 53       Died age 68 of CAD   Heart disease Mother        CABG x 2   Graves' disease Mother    Hypertension Mother    Thyroid disease Mother    Heart attack Mother 86   Dementia Father        deceased age 53 secondary to dementia   Bipolar disorder Father     Emphysema Father    Heart disease Father    COPD Father        smoker   Hypertension Father    Depression Father    Alcoholism Father    Other Sister 9       hysterectomy for fibroids   Heart disease Sister        CABG x 2   Heart attack Sister 20   Prostate cancer Brother        low grade; w/ surveillance  Thyroid nodules Brother 59   Heart disease Brother 56       CABG x 4   CAD Maternal Grandfather 60   Heart disease Maternal Grandfather    Heart attack Maternal Grandfather 14   Kidney cancer Cousin        maternal 1st cousin dx 52-47; former smoker   Fibroids Other        niece dx approx 14   Hypertension Other    Lung cancer Other        maternal great aunt (MGM's sister); not a smoker   Cancer Other        nephew dx neuroblastoma at 5.52 years old   Colon cancer Neg Hx    Colon polyps Neg Hx     Social History   Socioeconomic History   Marital status: Single    Spouse name: Not on file   Number of children: Not on file   Years of education: Not on file   Highest education level: 12th grade  Occupational History    Employer: [employer]    Comment: Outpatient scheduling in radiology  Tobacco Use   Smoking status: Former    Current packs/day: 0.00    Types: Cigarettes    Quit date: 09/29/2007    Years since quitting: 15.1   Smokeless tobacco: Never   Tobacco comments:    Smoked on and off for 20 years. Quit about 8 years ago (as of 08/2015)  Vaping Use   Vaping status: Never Used  Substance and Sexual Activity   Alcohol use: Not Currently    Alcohol/week: 0.0 standard drinks of alcohol   Drug use: No   Sexual activity: Not Currently    Birth control/protection: Post-menopausal  Other Topics Concern   Not on file  Social History Narrative   The patient is divorced, moved to [place] in 2006 from [place].  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  No alcohol.   Tobacco use:  She quit 5 years ago.  She smoked onand off for approximately 20 years.  No history of recreational drugUse. Drinks two cups of coffee per work day.    Social Determinants of Health   Financial Resource Strain: Low Risk  (11/27/2022)   Overall Financial Resource Strain (CARDIA)    Difficulty of Paying Living Expenses: Not very hard  Food Insecurity: No Food Insecurity (11/27/2022)   Hunger Vital Sign    Worried About Running Out of Food in the Last Year: Never true    Ran Out of Food in the Last Year: Never true  Transportation Needs: No Transportation Needs (11/27/2022)   PRAPARE - Administrator, Civil Service (Medical): No    Lack of Transportation (Non-Medical): No  Physical Activity: Insufficiently Active (11/27/2022)   Exercise Vital Sign    Days of Exercise per Week: 3 days    Minutes of Exercise per Session: 40 min  Stress: Stress Concern Present (11/27/2022)   [employer] of Occupational Health - Occupational Stress Questionnaire    Feeling of Stress : To some extent  Social Connections: Socially Isolated (11/27/2022)   Social Connection and Isolation Panel [NHANES]    Frequency of Communication with Friends and Family: Twice a week    Frequency of Social Gatherings with Friends and Family: More than three times a week    Attends Religious Services: Never    Production manager of [employer]  or Organizations: No    Attends Banker Meetings: Not on file    Marital Status: Divorced  Intimate Partner Violence: Not on file     Constitutional: Denies fever, malaise, fatigue, headache or abrupt weight changes.  HEENT: Denies eye pain, eye redness, ear pain, ringing in the ears, wax buildup, runny nose, nasal congestion, bloody nose, or sore throat. Respiratory: Denies difficulty breathing, shortness of breath, cough or sputum production.   Cardiovascular: Denies chest pain, chest tightness, palpitations or swelling in the hands or feet.   Gastrointestinal: Denies abdominal pain, bloating, constipation, diarrhea or blood in the stool.  GU: Denies urgency, frequency, pain with urination, burning sensation, blood in urine, odor or discharge. Musculoskeletal: Patient reports left shoulder pain, decrease in range of motion.  Denies difficulty with gait, muscle pain or joint swelling.  Skin: Denies redness, rashes, lesions or ulcercations.  Neurological: Patient reports neuropathic pain.  Denies dizziness, difficulty with memory, difficulty with speech or problems with balance and coordination.  Psych: Patient has a history of anxiety and depression.  Denies SI/HI.  No other specific complaints in a complete review of systems (except as listed in HPI above).     Objective:   Physical Exam  BP (!) 152/72   Ht 5\' 4"  (1.626 m)   Wt 210 lb (95.3 kg)   LMP 06/24/2010 (Approximate)   BMI 36.05 kg/m   Wt Readings from Last 3 Encounters:  11/14/22 207 lb 3.2 oz (94 kg)  11/02/22 208 lb 2 oz (94.4 kg)  07/20/22 209 lb (94.8 kg)    General: Appears her stated age, obese, in NAD. Skin: Warm, dry and intact. No ulcerations noted. HEENT: Head: normal shape and size; Eyes: sclera white, no icterus, conjunctiva pink, PERRLA and EOMs intact;  Neck:  Neck supple, trachea midline. No masses, lumps or thyromegaly present.  Cardiovascular: Normal rate and rhythm. S1,S2 noted.  No murmur, rubs or gallops noted. No JVD or BLE edema. No carotid bruits noted. Pulmonary/Chest: Normal effort and positive vesicular breath sounds. No respiratory distress. No wheezes, rales or ronchi noted.  Abdomen: Soft and nontender. Normal bowel sounds.  Musculoskeletal: Decreased internal and external rotation of the left shoulder.  No joint swelling noted.  No pain with palpation of the shoulder.  Negative drop can test on the left.  Shoulder shrug equal.  Strength 5/5 BUE.  Handgrips equal.  No difficulty with gait.  Neurological: Alert and oriented.  Coordination normal.  Psychiatric: Mood and affect normal. Behavior is normal. Judgment and thought content normal.   BMET    Component Value Date/Time   NA 140 05/30/2022 1539   NA 138 03/21/2017 1018   NA 137 02/18/2013 1554   K 4.5 05/30/2022 1539   K 4.4 02/18/2013 1554   CL 104 05/30/2022 1539   CO2 25 05/30/2022 1539   CO2 25 02/18/2013 1554   GLUCOSE 96 05/30/2022 1539   GLUCOSE 300 (H) 02/18/2013 1554   BUN 14 05/30/2022 1539   BUN 16 03/21/2017 1018   BUN 14.2 02/18/2013 1554   CREATININE 0.59 05/30/2022 1539   CREATININE 0.8 02/18/2013 1554   CALCIUM 9.7 05/30/2022 1539   CALCIUM 9.2 02/18/2013 1554   GFRNONAA >60 04/05/2022 1752   GFRNONAA >60 05/19/2021 0845   GFRAA >60 08/28/2019 0852   GFRAA >60 07/16/2017 1442    Lipid Panel     Component Value Date/Time   CHOL 121 05/30/2022 1539   CHOL 122 03/21/2017 1018   TRIG  133 05/30/2022 1539   HDL 58 05/30/2022 1539   HDL 41 03/21/2017 1018   CHOLHDL 2.1 05/30/2022 1539   VLDL 23.0 02/03/2020 0854   LDLCALC 42 05/30/2022 1539    CBC    Component Value Date/Time   WBC 9.5 05/30/2022 1539   RBC 4.67 05/30/2022 1539   HGB 12.6 05/30/2022 1539   HGB 13.1 05/19/2021 0845   HGB 13.5 03/21/2017 1018   HGB 12.5 02/18/2013 1554   HCT 38.9 05/30/2022 1539   HCT 42.0 03/21/2017 1018   HCT 39.6 02/18/2013 1554   PLT 321 05/30/2022 1539   PLT 250 05/19/2021 0845   PLT 306 02/18/2013 1554   MCV 83.3 05/30/2022 1539   MCV 86 03/21/2017 1018   MCV 82.6 02/18/2013 1554   MCH 27.0 05/30/2022 1539   MCHC 32.4 05/30/2022 1539   RDW 13.9 05/30/2022 1539   RDW 14.6 03/21/2017 1018   RDW 16.4 (H) 02/18/2013 1554   LYMPHSABS 1.4 05/19/2021 0845   LYMPHSABS 1.5 03/21/2017 1018   LYMPHSABS 2.0 02/18/2013 1554   MONOABS 0.5 05/19/2021 0845   MONOABS 0.6 02/18/2013 1554   EOSABS 0.2 05/19/2021 0845   EOSABS 0.1 03/21/2017 1018   BASOSABS 0.0 05/19/2021 0845   BASOSABS 0.0 03/21/2017 1018   BASOSABS 0.0 02/18/2013  1554    Hgb A1C Lab Results  Component Value Date   HGBA1C 6.3 (A) 11/14/2022            Assessment & Plan:   Chronic left shoulder pain:  X-ray left shoulder today Consider further treatment with PT versus MRI Okay to take Tylenol 1000 mg every 8 hours as needed for pain and inflammation  RTC in 6 months for annual exam Nicki Reaper, NP

## 2022-11-30 NOTE — Assessment & Plan Note (Signed)
Initially elevated but manual repeat improved Continue diltiazem, losartan and metoprolol C-Met today Reinforced DASH diet and exercise for weight loss

## 2022-11-30 NOTE — Assessment & Plan Note (Signed)
Status post thyroidectomy TSH and free T4 today We will adjust levothyroxine if needed based on labs

## 2022-11-30 NOTE — Assessment & Plan Note (Signed)
Encouraged diet and exercise for weight loss ?

## 2022-11-30 NOTE — Assessment & Plan Note (Signed)
No angina C-Met and lipid profile today Encouraged her to consume a low-fat diet Continue rosuvastatin, aspirin and Plavix

## 2022-11-30 NOTE — Assessment & Plan Note (Signed)
In remission She will continue to follow with oncology 

## 2022-11-30 NOTE — Patient Instructions (Signed)

## 2022-11-30 NOTE — Assessment & Plan Note (Signed)
C-Met and lipid profile today  Encouraged her to consume a low-fat diet Continue rosuvastatin 

## 2022-12-01 LAB — CBC
HCT: 40.4 % (ref 35.0–45.0)
Hemoglobin: 12.6 g/dL (ref 11.7–15.5)
MCH: 26.4 pg — ABNORMAL LOW (ref 27.0–33.0)
MCHC: 31.2 g/dL — ABNORMAL LOW (ref 32.0–36.0)
MCV: 84.5 fL (ref 80.0–100.0)
MPV: 10.2 fL (ref 7.5–12.5)
Platelets: 295 10*3/uL (ref 140–400)
RBC: 4.78 10*6/uL (ref 3.80–5.10)
RDW: 14.4 % (ref 11.0–15.0)
WBC: 9.2 10*3/uL (ref 3.8–10.8)

## 2022-12-01 LAB — COMPLETE METABOLIC PANEL WITH GFR
AG Ratio: 1.5 (calc) (ref 1.0–2.5)
ALT: 14 U/L (ref 6–29)
AST: 14 U/L (ref 10–35)
Albumin: 4.1 g/dL (ref 3.6–5.1)
Alkaline phosphatase (APISO): 82 U/L (ref 37–153)
BUN: 13 mg/dL (ref 7–25)
CO2: 27 mmol/L (ref 20–32)
Calcium: 9.5 mg/dL (ref 8.6–10.4)
Chloride: 102 mmol/L (ref 98–110)
Creat: 0.55 mg/dL (ref 0.50–1.03)
Globulin: 2.8 g/dL (ref 1.9–3.7)
Glucose, Bld: 171 mg/dL — ABNORMAL HIGH (ref 65–99)
Potassium: 4.3 mmol/L (ref 3.5–5.3)
Sodium: 140 mmol/L (ref 135–146)
Total Bilirubin: 0.3 mg/dL (ref 0.2–1.2)
Total Protein: 6.9 g/dL (ref 6.1–8.1)
eGFR: 107 mL/min/{1.73_m2} (ref >=60)

## 2022-12-01 LAB — LIPID PANEL
Cholesterol: 128 mg/dL (ref <200)
HDL: 64 mg/dL (ref >=50)
LDL Cholesterol (Calc): 44 mg/dL
Non-HDL Cholesterol (Calc): 64 mg/dL (ref <130)
Total CHOL/HDL Ratio: 2 (calc) (ref <5.0)
Triglycerides: 112 mg/dL (ref <150)

## 2022-12-01 LAB — T4, FREE: Free T4: 1.4 ng/dL (ref 0.8–1.8)

## 2022-12-01 LAB — TSH: TSH: 0.82 m[IU]/L (ref 0.40–4.50)

## 2022-12-06 DIAGNOSIS — M9903 Segmental and somatic dysfunction of lumbar region: Secondary | ICD-10-CM | POA: Diagnosis not present

## 2022-12-06 DIAGNOSIS — M62830 Muscle spasm of back: Secondary | ICD-10-CM | POA: Diagnosis not present

## 2022-12-06 DIAGNOSIS — M9901 Segmental and somatic dysfunction of cervical region: Secondary | ICD-10-CM | POA: Diagnosis not present

## 2022-12-06 DIAGNOSIS — R519 Headache, unspecified: Secondary | ICD-10-CM | POA: Diagnosis not present

## 2022-12-14 ENCOUNTER — Ambulatory Visit
Admission: RE | Admit: 2022-12-14 | Discharge: 2022-12-14 | Disposition: A | Discharge status: Home / Self Care | Payer: 59 | Source: Ambulatory Visit | Attending: Internal Medicine | Admitting: Internal Medicine

## 2022-12-14 ENCOUNTER — Ambulatory Visit
Admission: RE | Admit: 2022-12-14 | Discharge: 2022-12-14 | Disposition: A | Discharge status: Home / Self Care | Payer: 59 | Attending: Internal Medicine | Admitting: Internal Medicine

## 2022-12-14 DIAGNOSIS — M25512 Pain in left shoulder: Secondary | ICD-10-CM | POA: Insufficient documentation

## 2022-12-14 DIAGNOSIS — G8929 Other chronic pain: Secondary | ICD-10-CM | POA: Diagnosis not present

## 2022-12-14 DIAGNOSIS — M19012 Primary osteoarthritis, left shoulder: Secondary | ICD-10-CM | POA: Diagnosis not present

## 2022-12-14 DIAGNOSIS — I700 Atherosclerosis of aorta: Secondary | ICD-10-CM | POA: Diagnosis not present

## 2022-12-18 ENCOUNTER — Other Ambulatory Visit: Payer: Self-pay | Admitting: Internal Medicine

## 2022-12-19 ENCOUNTER — Other Ambulatory Visit: Payer: Self-pay

## 2022-12-19 MED ORDER — LOSARTAN POTASSIUM 50 MG PO TABS
50.0000 mg | ORAL_TABLET | Freq: Every day | ORAL | 0 refills | Status: DC
  Filled 2022-12-19: qty 90, 90d supply, fill #0

## 2022-12-19 NOTE — Telephone Encounter (Signed)
Requested by interface surescripts. Future visit in 5 months. Requested Prescriptions  Pending Prescriptions Disp Refills   losartan (COZAAR) 50 MG tablet 90 tablet 0    Sig: Take 1 tablet (50 mg total) by mouth daily.     Cardiovascular:  Angiotensin Receptor Blockers Passed - 12/18/2022  9:31 AM      Passed - Cr in normal range and within 180 days    Creatinine  Date Value Ref Range Status  02/18/2013 0.8 0.6 - 1.1 mg/dL Final   Creat  Date Value Ref Range Status  11/30/2022 0.55 0.50 - 1.03 mg/dL Final   Creatinine,U  Date Value Ref Range Status  06/07/2021 51.9 mg/dL Final   Creatinine, Urine  Date Value Ref Range Status  05/30/2022 89 20 - 275 mg/dL Final         Passed - K in normal range and within 180 days    Potassium  Date Value Ref Range Status  11/30/2022 4.3 3.5 - 5.3 mmol/L Final  02/18/2013 4.4 3.5 - 5.1 mEq/L Final         Passed - Patient is not pregnant      Passed - Last BP in normal range    BP Readings from Last 1 Encounters:  11/30/22 138/78         Passed - Valid encounter within last 6 months    Recent Outpatient Visits           2 weeks ago Type 2 diabetes mellitus with diabetic polyneuropathy, unspecified whether long term insulin use (HCC)   [place] [facility] [place], Salvadore Oxford, NP   6 months ago Encounter for general adult medical examination with abnormal findings   [place] [facility] [place], Salvadore Oxford, NP   1 year ago Coronary artery disease of native artery of native heart with stable angina pectoris [facility])   [place] [facility] [place], Salvadore Oxford, NP   1 year ago Encounter for general adult medical examination with abnormal findings   [place] [facility] [place], Salvadore Oxford, NP       Future Appointments             In 5 months Baity, Salvadore Oxford, NP [place] [facility], [facility]

## 2022-12-27 ENCOUNTER — Other Ambulatory Visit: Payer: Self-pay | Admitting: "Endocrinology

## 2022-12-27 DIAGNOSIS — Z794 Long term (current) use of insulin: Secondary | ICD-10-CM

## 2022-12-28 ENCOUNTER — Other Ambulatory Visit: Payer: Self-pay

## 2022-12-28 MED ORDER — FREESTYLE LIBRE 3 SENSOR MISC
1.0000 | 2 refills | Status: DC
  Filled 2022-12-28: qty 2, 28d supply, fill #0
  Filled 2023-01-22: qty 2, 28d supply, fill #1
  Filled 2023-02-25: qty 2, 28d supply, fill #2

## 2023-01-08 ENCOUNTER — Encounter: Payer: Self-pay | Admitting: Internal Medicine

## 2023-01-08 DIAGNOSIS — R519 Headache, unspecified: Secondary | ICD-10-CM | POA: Diagnosis not present

## 2023-01-08 DIAGNOSIS — M9901 Segmental and somatic dysfunction of cervical region: Secondary | ICD-10-CM | POA: Diagnosis not present

## 2023-01-08 DIAGNOSIS — M62830 Muscle spasm of back: Secondary | ICD-10-CM | POA: Diagnosis not present

## 2023-01-08 DIAGNOSIS — G8929 Other chronic pain: Secondary | ICD-10-CM

## 2023-01-08 DIAGNOSIS — M9903 Segmental and somatic dysfunction of lumbar region: Secondary | ICD-10-CM | POA: Diagnosis not present

## 2023-01-22 ENCOUNTER — Other Ambulatory Visit: Payer: Self-pay

## 2023-01-22 ENCOUNTER — Other Ambulatory Visit: Payer: Self-pay | Admitting: Internal Medicine

## 2023-01-23 ENCOUNTER — Other Ambulatory Visit: Payer: Self-pay

## 2023-01-26 ENCOUNTER — Other Ambulatory Visit: Payer: Self-pay

## 2023-01-26 MED FILL — Pantoprazole Sodium EC Tab 40 MG (Base Equiv): ORAL | 30 days supply | Qty: 30 | Fill #0 | Status: AC

## 2023-01-26 NOTE — Telephone Encounter (Signed)
 Requested Prescriptions  Pending Prescriptions Disp Refills   pantoprazole  (PROTONIX ) 40 MG tablet 30 tablet 3    Sig: TAKE 1 TABLET BY MOUTH DAILY.     Gastroenterology: Proton Pump Inhibitors Passed - 01/26/2023  3:17 PM      Passed - Valid encounter within last 12 months    Recent Outpatient Visits           1 month ago Type 2 diabetes mellitus with diabetic polyneuropathy, unspecified whether long term insulin  use [facility])   [place] [facility] [place], Angeline ORN, NP   8 months ago Encounter for general adult medical examination with abnormal findings   [place] [facility] [place], [place] W, NP   1 year ago Coronary artery disease of native artery of native heart with stable angina pectoris [facility])   [place] [facility] [place], Angeline ORN, NP   1 year ago Encounter for general adult medical examination with abnormal findings   [place] [facility] [place], Angeline ORN, NP       Future Appointments             In 4 months Baity, Angeline ORN, NP [place] [facility], [facility]

## 2023-01-28 ENCOUNTER — Other Ambulatory Visit: Payer: Self-pay

## 2023-02-06 ENCOUNTER — Other Ambulatory Visit: Payer: Self-pay | Admitting: "Endocrinology

## 2023-02-06 ENCOUNTER — Other Ambulatory Visit: Payer: Self-pay

## 2023-02-06 MED ORDER — OZEMPIC (2 MG/DOSE) 8 MG/3ML ~~LOC~~ SOPN
2.0000 mg | PEN_INJECTOR | SUBCUTANEOUS | 0 refills | Status: DC
  Filled 2023-02-06: qty 9, 84d supply, fill #0

## 2023-02-12 DIAGNOSIS — M9901 Segmental and somatic dysfunction of cervical region: Secondary | ICD-10-CM | POA: Diagnosis not present

## 2023-02-12 DIAGNOSIS — M9903 Segmental and somatic dysfunction of lumbar region: Secondary | ICD-10-CM | POA: Diagnosis not present

## 2023-02-12 DIAGNOSIS — M62830 Muscle spasm of back: Secondary | ICD-10-CM | POA: Diagnosis not present

## 2023-02-12 DIAGNOSIS — R519 Headache, unspecified: Secondary | ICD-10-CM | POA: Diagnosis not present

## 2023-02-15 ENCOUNTER — Other Ambulatory Visit: Payer: Self-pay

## 2023-02-15 ENCOUNTER — Other Ambulatory Visit: Payer: Self-pay | Admitting: "Endocrinology

## 2023-02-15 DIAGNOSIS — E1159 Type 2 diabetes mellitus with other circulatory complications: Secondary | ICD-10-CM

## 2023-02-16 ENCOUNTER — Other Ambulatory Visit: Payer: Self-pay

## 2023-02-16 MED ORDER — XIGDUO XR 2.5-1000 MG PO TB24
2.0000 | ORAL_TABLET | Freq: Every day | ORAL | 1 refills | Status: DC
  Filled 2023-02-16: qty 60, 30d supply, fill #0
  Filled 2023-03-19: qty 60, 30d supply, fill #1
  Filled 2023-04-18: qty 60, 30d supply, fill #2

## 2023-02-19 ENCOUNTER — Other Ambulatory Visit: Payer: Self-pay

## 2023-02-21 ENCOUNTER — Ambulatory Visit: Payer: 59 | Admitting: Podiatry

## 2023-02-21 DIAGNOSIS — M7542 Impingement syndrome of left shoulder: Secondary | ICD-10-CM | POA: Diagnosis not present

## 2023-02-25 ENCOUNTER — Other Ambulatory Visit: Payer: Self-pay | Admitting: Internal Medicine

## 2023-02-25 DIAGNOSIS — I25118 Atherosclerotic heart disease of native coronary artery with other forms of angina pectoris: Secondary | ICD-10-CM

## 2023-02-25 MED FILL — Pantoprazole Sodium EC Tab 40 MG (Base Equiv): ORAL | 30 days supply | Qty: 30 | Fill #1 | Status: AC

## 2023-02-26 ENCOUNTER — Other Ambulatory Visit: Payer: Self-pay

## 2023-02-26 ENCOUNTER — Other Ambulatory Visit: Payer: Self-pay | Admitting: Internal Medicine

## 2023-02-26 DIAGNOSIS — I25118 Atherosclerotic heart disease of native coronary artery with other forms of angina pectoris: Secondary | ICD-10-CM

## 2023-02-27 ENCOUNTER — Other Ambulatory Visit: Payer: Self-pay

## 2023-02-27 MED FILL — Metoprolol Tartrate Tab 100 MG: ORAL | 90 days supply | Qty: 180 | Fill #0 | Status: AC

## 2023-02-28 ENCOUNTER — Encounter: Payer: Self-pay | Admitting: Podiatry

## 2023-02-28 ENCOUNTER — Ambulatory Visit (INDEPENDENT_AMBULATORY_CARE_PROVIDER_SITE_OTHER): Payer: 59 | Admitting: Podiatry

## 2023-02-28 DIAGNOSIS — E1142 Type 2 diabetes mellitus with diabetic polyneuropathy: Secondary | ICD-10-CM

## 2023-02-28 NOTE — Progress Notes (Signed)
 She presents today for follow-up of her diabetes and would like to have her feet looked at.  She states that all in all she is doing pretty well she feels that her blood sugar is improving with a hemoglobin A1c of 6.4%.  Has recently got to a new endocrinologist.  She states that her subfifth metatarsal is bothersome occasionally.  Objective: Vital signs are stable she is alert oriented x 3 pulses are palpable.  Neurologic sensorium is intact prep receptively and per [place] Speed monofilament.  Muscle strength is normal and symmetrical bilateral.  Deep tendon reflexes are intact muscle strength is normal and symmetrical.  Orthopedic evaluation demonstrates all joints distal to the ankle level full range of motion without crepitation.  Cutaneous evaluation does demonstrate some mild xerosis no open lesions or wounds.  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  Plan: Follow-up with her in 6 months

## 2023-03-01 ENCOUNTER — Other Ambulatory Visit: Payer: Self-pay

## 2023-03-12 ENCOUNTER — Other Ambulatory Visit: Payer: Self-pay | Admitting: Internal Medicine

## 2023-03-13 ENCOUNTER — Other Ambulatory Visit: Payer: Self-pay | Admitting: Internal Medicine

## 2023-03-13 ENCOUNTER — Telehealth: Payer: Self-pay | Admitting: Pharmacy Technician

## 2023-03-13 ENCOUNTER — Other Ambulatory Visit: Payer: Self-pay

## 2023-03-13 ENCOUNTER — Other Ambulatory Visit (HOSPITAL_COMMUNITY): Payer: Self-pay

## 2023-03-13 DIAGNOSIS — M9903 Segmental and somatic dysfunction of lumbar region: Secondary | ICD-10-CM | POA: Diagnosis not present

## 2023-03-13 DIAGNOSIS — M62830 Muscle spasm of back: Secondary | ICD-10-CM | POA: Diagnosis not present

## 2023-03-13 DIAGNOSIS — M9901 Segmental and somatic dysfunction of cervical region: Secondary | ICD-10-CM | POA: Diagnosis not present

## 2023-03-13 DIAGNOSIS — R519 Headache, unspecified: Secondary | ICD-10-CM | POA: Diagnosis not present

## 2023-03-13 NOTE — Telephone Encounter (Signed)
 Pharmacy Patient Advocate Encounter   Received notification from CoverMyMeds that prior authorization for [facility] 3 sensor is required/requested.   Insurance verification completed.   The patient is insured through [facility] .   Per test claim: PA required and submitted KEY/EOC/Request #: [id number] Returned a NA result. Stating that there's a PA already on file that expires 03/24/23 with 987 fills remaining.    Will call to see if she needs a new PA or if it's just covered now. She can fill them again 03/19/23, which is before the PA expires.

## 2023-03-14 ENCOUNTER — Other Ambulatory Visit: Payer: Self-pay

## 2023-03-14 MED FILL — Losartan Potassium Tab 50 MG: ORAL | 90 days supply | Qty: 90 | Fill #0 | Status: AC

## 2023-03-14 NOTE — Telephone Encounter (Signed)
 Requested Prescriptions  Pending Prescriptions Disp Refills   losartan (COZAAR) 50 MG tablet 90 tablet 0    Sig: Take 1 tablet (50 mg total) by mouth daily.     Cardiovascular:  Angiotensin Receptor Blockers Passed - 03/14/2023 11:49 AM      Passed - Cr in normal range and within 180 days    Creatinine  Date Value Ref Range Status  02/18/2013 0.8 0.6 - 1.1 mg/dL Final   Creat  Date Value Ref Range Status  11/30/2022 0.55 0.50 - 1.03 mg/dL Final   Creatinine,U  Date Value Ref Range Status  06/07/2021 51.9 mg/dL Final   Creatinine, Urine  Date Value Ref Range Status  05/30/2022 89 20 - 275 mg/dL Final         Passed - K in normal range and within 180 days    Potassium  Date Value Ref Range Status  11/30/2022 4.3 3.5 - 5.3 mmol/L Final  02/18/2013 4.4 3.5 - 5.1 mEq/L Final         Passed - Patient is not pregnant      Passed - Last BP in normal range    BP Readings from Last 1 Encounters:  11/30/22 138/78         Passed - Valid encounter within last 6 months    Recent Outpatient Visits           3 months ago Type 2 diabetes mellitus with diabetic polyneuropathy, unspecified whether long term insulin use (HCC)   [place] [facility] [place], Salvadore Oxford, NP   9 months ago Encounter for general adult medical examination with abnormal findings   [place] [facility] [place], [place], NP   1 year ago Coronary artery disease of native artery of native heart with stable angina pectoris [facility])   [place] [facility] [place], Salvadore Oxford, NP   2 years ago Encounter for general adult medical examination with abnormal findings   [place] [facility] [place], Salvadore Oxford, NP       Future Appointments             In 2 months Baity, Salvadore Oxford, NP [place] [facility], [facility]

## 2023-03-19 ENCOUNTER — Other Ambulatory Visit: Payer: Self-pay

## 2023-03-20 ENCOUNTER — Other Ambulatory Visit: Payer: Self-pay

## 2023-03-20 ENCOUNTER — Ambulatory Visit: Payer: 59 | Admitting: "Endocrinology

## 2023-03-26 ENCOUNTER — Other Ambulatory Visit: Payer: Self-pay | Admitting: "Endocrinology

## 2023-03-26 ENCOUNTER — Other Ambulatory Visit: Payer: Self-pay | Admitting: Internal Medicine

## 2023-03-26 ENCOUNTER — Other Ambulatory Visit: Payer: Self-pay

## 2023-03-26 DIAGNOSIS — Z794 Long term (current) use of insulin: Secondary | ICD-10-CM

## 2023-03-26 MED ORDER — DILTIAZEM HCL ER COATED BEADS 120 MG PO CP24
120.0000 mg | ORAL_CAPSULE | Freq: Every day | ORAL | 0 refills | Status: DC
  Filled 2023-03-26: qty 90, 90d supply, fill #0

## 2023-03-26 MED FILL — Pantoprazole Sodium EC Tab 40 MG (Base Equiv): ORAL | 30 days supply | Qty: 30 | Fill #2 | Status: AC

## 2023-03-27 ENCOUNTER — Other Ambulatory Visit: Payer: Self-pay

## 2023-03-27 MED ORDER — FREESTYLE LIBRE 3 SENSOR MISC
1.0000 | 2 refills | Status: DC
  Filled 2023-03-27: qty 2, 28d supply, fill #0
  Filled 2023-04-24: qty 2, 28d supply, fill #1
  Filled 2023-05-20: qty 2, 28d supply, fill #2

## 2023-04-02 ENCOUNTER — Other Ambulatory Visit (HOSPITAL_COMMUNITY): Payer: Self-pay

## 2023-04-03 ENCOUNTER — Other Ambulatory Visit (HOSPITAL_COMMUNITY): Payer: Self-pay

## 2023-04-03 NOTE — Telephone Encounter (Signed)
 Pt was able to fill 03/27/23 for $30. No PA needed at this time.

## 2023-04-06 ENCOUNTER — Other Ambulatory Visit: Payer: Self-pay

## 2023-04-18 ENCOUNTER — Other Ambulatory Visit: Payer: Self-pay

## 2023-04-24 ENCOUNTER — Other Ambulatory Visit: Payer: Self-pay | Admitting: "Endocrinology

## 2023-04-24 ENCOUNTER — Other Ambulatory Visit: Payer: Self-pay | Admitting: Internal Medicine

## 2023-04-24 ENCOUNTER — Other Ambulatory Visit: Payer: Self-pay

## 2023-04-25 ENCOUNTER — Other Ambulatory Visit: Payer: Self-pay

## 2023-04-25 MED FILL — Semaglutide Soln Pen-inj 2 MG/DOSE (8 MG/3ML): SUBCUTANEOUS | 84 days supply | Qty: 9 | Fill #0 | Status: AC

## 2023-04-25 NOTE — Telephone Encounter (Signed)
 Requested Prescriptions   Pending Prescriptions Disp Refills   OZEMPIC, 2 MG/DOSE, 8 MG/3ML SOPN [Pharmacy Med Name: Semaglutide, 2 MG/DOSE, (OZEMPIC, 2 MG/DOSE,) 8 MG/3ML Solution Pen-injector] 9 mL 0    Sig: Inject 2 mg into the skin once a week.

## 2023-04-26 ENCOUNTER — Other Ambulatory Visit: Payer: Self-pay

## 2023-04-26 MED FILL — Rosuvastatin Calcium Tab 20 MG: ORAL | 90 days supply | Qty: 90 | Fill #0 | Status: AC

## 2023-04-26 NOTE — Telephone Encounter (Signed)
 Labs in date.   LOV 11/30/2022 with another appt upcoming on 05/31/2023.  Requested Prescriptions  Pending Prescriptions Disp Refills   rosuvastatin (CRESTOR) 20 MG tablet 90 tablet 1    Sig: Take 1 tablet (20 mg total) by mouth daily.     Cardiovascular:  Antilipid - Statins 2 Failed - 04/26/2023 11:25 AM      Failed - Valid encounter within last 12 months    Recent Outpatient Visits   None     Future Appointments             In 1 month Baity, Salvadore Oxford, NP [place] [facility], [facility]            Failed - Lipid Panel in normal range within the last 12 months    Cholesterol, Total  Date Value Ref Range Status  03/21/2017 122 100 - 199 mg/dL Final   Cholesterol  Date Value Ref Range Status  11/30/2022 128 <200 mg/dL Final   LDL Cholesterol (Calc)  Date Value Ref Range Status  11/30/2022 44 mg/dL (calc) Final    Comment:    Reference range: <100 . Desirable range <100 mg/dL for primary prevention;   <70 mg/dL for patients with CHD or diabetic patients  with > or = 2 CHD risk factors. Marland Kitchen LDL-C is now calculated using the Martin-Hopkins  calculation, which is a validated novel method providing  better accuracy than the Friedewald equation in the  estimation of LDL-C.  Horald Pollen et al. Lenox Ahr. 4098;119(14): 2061-2068  (http://education.[facility].com/faq/FAQ164)    Direct LDL  Date Value Ref Range Status  09/01/2014 153.0 mg/dL Final    Comment:    Optimal:  <100 mg/dLNear or Above Optimal:  100-129 mg/dLBorderline High:  130-159 mg/dLHigh:  160-189 mg/dLVery High:  >190 mg/dL   HDL  Date Value Ref Range Status  11/30/2022 64 > OR = 50 mg/dL Final  78/29/5621 41 >30 mg/dL Final   Triglycerides  Date Value Ref Range Status  11/30/2022 112 <150 mg/dL Final         Passed - Cr in normal range and within 360 days    Creatinine  Date Value Ref Range Status  02/18/2013 0.8 0.6 - 1.1 mg/dL Final   Creat  Date Value Ref Range Status   11/30/2022 0.55 0.50 - 1.03 mg/dL Final   Creatinine,U  Date Value Ref Range Status  06/07/2021 51.9 mg/dL Final   Creatinine, Urine  Date Value Ref Range Status  05/30/2022 89 20 - 275 mg/dL Final         Passed - Patient is not pregnant

## 2023-05-07 DIAGNOSIS — M9901 Segmental and somatic dysfunction of cervical region: Secondary | ICD-10-CM | POA: Diagnosis not present

## 2023-05-07 DIAGNOSIS — M9903 Segmental and somatic dysfunction of lumbar region: Secondary | ICD-10-CM | POA: Diagnosis not present

## 2023-05-07 DIAGNOSIS — R519 Headache, unspecified: Secondary | ICD-10-CM | POA: Diagnosis not present

## 2023-05-07 DIAGNOSIS — M62830 Muscle spasm of back: Secondary | ICD-10-CM | POA: Diagnosis not present

## 2023-05-07 MED FILL — Pantoprazole Sodium EC Tab 40 MG (Base Equiv): ORAL | 30 days supply | Qty: 30 | Fill #3 | Status: AC

## 2023-05-14 ENCOUNTER — Other Ambulatory Visit: Payer: Self-pay

## 2023-05-15 ENCOUNTER — Other Ambulatory Visit: Payer: Self-pay

## 2023-05-15 ENCOUNTER — Other Ambulatory Visit: Payer: Self-pay | Admitting: Internal Medicine

## 2023-05-15 DIAGNOSIS — E1142 Type 2 diabetes mellitus with diabetic polyneuropathy: Secondary | ICD-10-CM

## 2023-05-16 ENCOUNTER — Other Ambulatory Visit: Payer: Self-pay

## 2023-05-16 MED FILL — Levothyroxine Sodium Tab 112 MCG: ORAL | 90 days supply | Qty: 90 | Fill #0 | Status: AC

## 2023-05-16 NOTE — Telephone Encounter (Signed)
 Requested Prescriptions  Pending Prescriptions Disp Refills   levothyroxine  (SYNTHROID ) 112 MCG tablet 90 tablet 0    Sig: Take 1 tablet (112 mcg total) by mouth daily.     Endocrinology:  Hypothyroid Agents Failed - 05/16/2023 10:42 AM      Failed - Valid encounter within last 12 months    Recent Outpatient Visits   None     Future Appointments             In 2 weeks Baity, Rankin Buzzard, NP  [facility]            Passed - TSH in normal range and within 360 days    TSH  Date Value Ref Range Status  11/30/2022 0.82 0.40 - 4.50 mIU/L Final

## 2023-05-21 ENCOUNTER — Other Ambulatory Visit: Payer: Self-pay | Admitting: "Endocrinology

## 2023-05-21 ENCOUNTER — Encounter: Payer: Self-pay | Admitting: Internal Medicine

## 2023-05-21 ENCOUNTER — Other Ambulatory Visit: Payer: Self-pay

## 2023-05-21 DIAGNOSIS — E1159 Type 2 diabetes mellitus with other circulatory complications: Secondary | ICD-10-CM

## 2023-05-22 ENCOUNTER — Other Ambulatory Visit: Payer: Self-pay

## 2023-05-22 MED ORDER — XIGDUO XR 2.5-1000 MG PO TB24
2.0000 | ORAL_TABLET | Freq: Every day | ORAL | 0 refills | Status: DC
  Filled 2023-05-22: qty 180, 90d supply, fill #0

## 2023-05-23 LAB — HM DIABETES EYE EXAM

## 2023-05-28 ENCOUNTER — Other Ambulatory Visit: Payer: Self-pay

## 2023-05-28 ENCOUNTER — Other Ambulatory Visit: Payer: Self-pay | Admitting: Internal Medicine

## 2023-05-29 ENCOUNTER — Other Ambulatory Visit: Payer: Self-pay

## 2023-05-30 ENCOUNTER — Other Ambulatory Visit: Payer: Self-pay

## 2023-05-30 MED FILL — Pantoprazole Sodium EC Tab 40 MG (Base Equiv): ORAL | 30 days supply | Qty: 30 | Fill #0 | Status: CN

## 2023-05-30 NOTE — Telephone Encounter (Signed)
 Requested Prescriptions  Pending Prescriptions Disp Refills   pantoprazole  (PROTONIX ) 40 MG tablet 30 tablet 3    Sig: Take 1 tablet (40 mg total) by mouth daily.     Gastroenterology: Proton Pump Inhibitors Failed - 05/30/2023  9:43 AM      Failed - Valid encounter within last 12 months    Recent Outpatient Visits   None     Future Appointments             Tomorrow Baity, Rankin Buzzard, NP [facility]

## 2023-05-31 ENCOUNTER — Ambulatory Visit (INDEPENDENT_AMBULATORY_CARE_PROVIDER_SITE_OTHER): Payer: Self-pay | Admitting: Internal Medicine

## 2023-05-31 ENCOUNTER — Other Ambulatory Visit: Payer: Self-pay

## 2023-05-31 ENCOUNTER — Encounter: Payer: Self-pay | Admitting: Internal Medicine

## 2023-05-31 VITALS — BP 118/78 | Ht 64.0 in | Wt 190.6 lb

## 2023-05-31 DIAGNOSIS — E890 Postprocedural hypothyroidism: Secondary | ICD-10-CM | POA: Diagnosis not present

## 2023-05-31 DIAGNOSIS — E6609 Other obesity due to excess calories: Secondary | ICD-10-CM

## 2023-05-31 DIAGNOSIS — Z1231 Encounter for screening mammogram for malignant neoplasm of breast: Secondary | ICD-10-CM | POA: Diagnosis not present

## 2023-05-31 DIAGNOSIS — Z7984 Long term (current) use of oral hypoglycemic drugs: Secondary | ICD-10-CM | POA: Diagnosis not present

## 2023-05-31 DIAGNOSIS — Z780 Asymptomatic menopausal state: Secondary | ICD-10-CM | POA: Diagnosis not present

## 2023-05-31 DIAGNOSIS — Z7985 Long-term (current) use of injectable non-insulin antidiabetic drugs: Secondary | ICD-10-CM

## 2023-05-31 DIAGNOSIS — E1142 Type 2 diabetes mellitus with diabetic polyneuropathy: Secondary | ICD-10-CM | POA: Diagnosis not present

## 2023-05-31 DIAGNOSIS — E66811 Obesity, class 1: Secondary | ICD-10-CM | POA: Diagnosis not present

## 2023-05-31 DIAGNOSIS — Z0001 Encounter for general adult medical examination with abnormal findings: Secondary | ICD-10-CM

## 2023-05-31 DIAGNOSIS — Z6832 Body mass index (BMI) 32.0-32.9, adult: Secondary | ICD-10-CM

## 2023-05-31 DIAGNOSIS — E1159 Type 2 diabetes mellitus with other circulatory complications: Secondary | ICD-10-CM

## 2023-05-31 MED ORDER — XIGDUO XR 2.5-1000 MG PO TB24
2.0000 | ORAL_TABLET | Freq: Every day | ORAL | 1 refills | Status: DC
  Filled 2023-05-31 – 2023-08-16 (×2): qty 180, 90d supply, fill #0
  Filled 2023-11-13: qty 180, 90d supply, fill #1

## 2023-05-31 MED FILL — Pantoprazole Sodium EC Tab 40 MG (Base Equiv): ORAL | 30 days supply | Qty: 30 | Fill #0 | Status: AC

## 2023-05-31 NOTE — Assessment & Plan Note (Signed)
 Encouraged diet and exercise for weight loss ?

## 2023-05-31 NOTE — Progress Notes (Signed)
 Subjective:    Patient ID: Stacey Roberts, female    DOB: 03-04-1965, 58 y.o.   MRN: [id number]  HPI  Patient presents to clinic today for her annual exam.  Flu: 10/2022 Tetanus: 04/2005 Pneumovax: 04/2005 COVID: Pfizer x 2 Shingrix: Never Pap smear: 04/2022 Mammogram: 06/2022 Colon screening: 03/2015 Vision screening: annually Dentist: biannually  Diet: She does eat meat. She consumes fruits and veggies. She does eat some fried foods. She drinks mostly water, coffee Exercise: Walking  Review of Systems     Past Medical History:  Diagnosis Date   Allergy    seasonal   BRCA negative 2017   Breast cancer (HCC) 2014   Breast cancer (HCC) 2017   Bronchitis    hx of   Coronary artery disease 06/2019   80% mid RCA stenosis - medical management   Diabetes mellitus, type II (HCC)    Endometrial hyperplasia 2014   Mirena placed; Dr. 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 Kathyanne Parkers    Current Outpatient Medications  Medication Sig Dispense Refill   aspirin  EC 81 MG tablet Take 1 tablet (81 mg total) by mouth daily. 90 tablet 3   Blood Glucose Monitoring Suppl (FREESTYLE FREEDOM LITE) w/Device KIT test twice daily 1 kit 0   Continuous Glucose Sensor (FREESTYLE LIBRE 3 SENSOR) MISC 1 Device by Does not apply route every 14 (fourteen) days. Apply 1 sensor on upper arm every 14 days for continuous glucose monitoring 2 each 2    Dapagliflozin  Pro-metFORMIN  ER (XIGDUO  XR) 2.05-998 MG TB24 Take 2 tablets by mouth daily. 180 tablet 0   diltiazem  (CARDIZEM  CD) 120 MG 24 hr capsule Take 1 capsule (120 mg total) by mouth daily. 90 capsule 0   glucose blood (FREESTYLE LITE) test strip Use 2 (two) times daily. (dx [name]) 50 each 0   insulin  glargine-yfgn (SEMGLEE ) 100 UNIT/ML Pen Inject 45 Units into the skin every morning. 45 mL 3   Insulin  Pen Needle (UNIFINE PENTIPS) 31G X 8 MM MISC use to inject twice a day 100 each 3   Lancets (FREESTYLE) lancets 1 each by Other route 2 (two) times daily. [name] 200 each 0   levothyroxine  (SYNTHROID ) 112 MCG tablet Take 1 tablet (112 mcg total) by mouth daily. 90 tablet 0   losartan  (COZAAR ) 50 MG tablet Take 1 tablet (50 mg total) by mouth daily. 90 tablet 0   metoprolol  tartrate (LOPRESSOR ) 100 MG tablet Take 1 tablet (100 mg total) by mouth 2 (two) times daily. 180 tablet 0   Multiple Vitamins-Minerals (MULTIVITAMIN WITH MINERALS) tablet Take 1 tablet by mouth daily. Woman's Gummies     nitroGLYCERIN  (NITROSTAT ) 0.4 MG SL tablet Place 1 tablet (0.4 mg total) under the tongue every 5 (five) minutes as needed for chest pain. 25 tablet prn  Semaglutide , 2 MG/DOSE, (OZEMPIC , 2 MG/DOSE,) 8 MG/3ML SOPN Inject 2 mg into the skin once a week. 9 mL 0   pantoprazole  (PROTONIX ) 40 MG tablet Take 1 tablet (40 mg total) by mouth daily. 30 tablet 3   pregabalin  (LYRICA ) 75 MG capsule Take 1 capsule (75 mg total) by mouth daily. 90 capsule 0   rosuvastatin  (CRESTOR ) 20 MG tablet Take 1 tablet (20 mg total) by mouth daily. 90 tablet 0   No current facility-administered medications for this visit.    Allergies  Allergen Reactions   Ace Inhibitors Cough    REACTION: dry cough   Penicillins Shortness Of Breath and Rash    Pt has tolerated Cephalexin  in 2017 and again in 2018.   Has patient had a PCN reaction causing immediate rash, facial/tongue/throat swelling, SOB or lightheadedness with  hypotension: Yes Has patient had a PCN reaction causing severe rash involving mucus membranes or skin necrosis: Yes Has patient had a PCN reaction that required hospitalization No Has patient had a PCN reaction occurring within the last 10 years: No If all of the above answers are "NO", then may proceed with Cephalosporin use.    Clindamycin /Lincomycin Other (See Comments)    Severe stomach cramps   Ciprofloxacin  Itching    Family History  Problem Relation Age of Onset   CAD Mother 28       Died age 56 of CAD   Heart disease Mother        CABG x 2   Graves' disease Mother    Hypertension Mother    Thyroid  disease Mother    Heart attack Mother 73   Dementia Father        deceased age 73 secondary to dementia   Bipolar disorder Father    Emphysema Father    Heart disease Father    COPD Father        smoker   Hypertension Father    Depression Father    Alcoholism Father    Other Sister 50       hysterectomy for fibroids   Heart disease Sister        CABG x 2   Heart attack Sister 85   Prostate cancer Brother        low grade; w/ surveillance   Thyroid  nodules Brother 21   Heart disease Brother 34       CABG x 4   CAD Maternal Grandfather 60   Heart disease Maternal Grandfather    Heart attack Maternal Grandfather 25   Kidney cancer Cousin        maternal 1st cousin dx 15-47; former smoker   Fibroids Other        niece dx approx 33   Hypertension Other    Lung cancer Other        maternal great aunt (MGM's sister); not a smoker   Cancer Other        nephew dx neuroblastoma at 76.11 years old   Colon cancer Neg Hx    Colon polyps Neg Hx     Social History   Socioeconomic History   Marital status: Single    Spouse name: Not on file   Number of children: Not on file   Years of education: Not on file   Highest education level: 12th grade  Occupational History    Employer: [employer]    Comment: Outpatient scheduling in radiology  Tobacco Use   Smoking  status: Former    Current  packs/day: 0.00    Types: Cigarettes    Quit date: 09/29/2007    Years since quitting: 15.6   Smokeless tobacco: Never   Tobacco comments:    Smoked on and off for 20 years. Quit about 8 years ago (as of 08/2015)  Vaping Use   Vaping status: Never Used  Substance and Sexual Activity   Alcohol use: Not Currently    Alcohol/week: 0.0 standard drinks of alcohol   Drug use: No   Sexual activity: Not Currently    Birth control/protection: Post-menopausal  Other Topics Concern   Not on file  Social History Narrative   The patient is divorced, moved to [place]  in 2006 from [place].  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  No alcohol.  Tobacco use:  She quit 5 years ago.  She smoked onand off for approximately 20 years.  No history of recreational drugUse. Drinks two cups of coffee per work day.    Social Drivers of Corporate investment banker Strain: Low Risk  (11/27/2022)   Overall Financial Resource Strain (CARDIA)    Difficulty of Paying Living Expenses: Not very hard  Food Insecurity: No Food Insecurity (11/27/2022)   Hunger Vital Sign    Worried About Running Out of Food in the Last Year: Never true    Ran Out of Food in the Last Year: Never true  Transportation Needs: No Transportation Needs (11/27/2022)   PRAPARE - Administrator, Civil Service (Medical): No    Lack of Transportation (Non-Medical): No  Physical Activity: Insufficiently Active (11/27/2022)   Exercise Vital Sign    Days of Exercise per Week: 3 days    Minutes of Exercise per Session: 40 min  Stress: Stress Concern Present (11/27/2022)   [employer] of Occupational Health - Occupational Stress Questionnaire    Feeling of Stress : To some extent  Social Connections: Socially Isolated (11/27/2022)   Social Connection and Isolation Panel [NHANES]    Frequency of Communication with  Friends and Family: Twice a week    Frequency of Social Gatherings with Friends and Family: More than three times a week    Attends Religious Services: Never    Database administrator or Organizations: No    Attends Engineer, structural: Not on file    Marital Status: Divorced  Intimate Partner Violence: Not on file     Constitutional: Denies fever, malaise, fatigue, headache or abrupt weight changes.  HEENT: Denies eye pain, eye redness, ear pain, ringing in the ears, wax buildup, runny nose, nasal congestion, bloody nose, or sore throat. Respiratory: Denies difficulty breathing, shortness of breath, cough or sputum production.   Cardiovascular: Denies chest pain, chest tightness, palpitations or swelling in the hands or feet.  Gastrointestinal: Pt reports constipation. Denies abdominal pain, bloating, diarrhea or blood in the stool.  GU: Pt reports incomplete bladder emptying. Denies urgency, frequency, pain with urination, burning sensation, blood in urine, odor or discharge. Musculoskeletal: Denies decrease in range of motion, difficulty with gait, muscle pain or joint pain and swelling.  Skin: Pt reports dry skin. Denies redness, rashes, lesions or ulcercations.  Neurological: Denies dizziness, difficulty with memory, difficulty with speech or problems with balance and coordination.  Psych: Patient has a history of anxiety and depression.  Denies SI/HI.  No other specific complaints in a complete review of systems (except as listed in HPI above).  Objective:   Physical Exam BP 118/78 (BP Location: Right Arm, Patient Position: Sitting, Cuff Size: Normal)   Ht 5\' 4"  (1.626 m)   Wt 190 lb 9.6 oz (86.5 kg)   LMP 06/24/2010 (Approximate)   BMI 32.72 kg/m    Wt Readings from Last 3 Encounters:  11/30/22 210 lb (95.3 kg)  11/14/22 207 lb 3.2 oz (94 kg)  11/02/22 208 lb 2 oz (94.4 kg)    General: Appears her stated age, obese, in NAD. Skin: Warm, dry and intact. No  ulcerations noted. HEENT: Head: normal shape and size; Eyes: sclera white, no icterus, conjunctiva pink, PERRLA and EOMs intact;  Neck:  Neck supple, trachea midline. No masses, lumps or thyromegaly present.  Cardiovascular: Normal rate and rhythm. S1,S2 noted.  No murmur, rubs or gallops noted. No JVD or BLE edema. No carotid bruits noted. Pulmonary/Chest: Normal effort and positive vesicular breath sounds. No respiratory distress. No wheezes, rales or ronchi noted.  Abdomen: Normal bowel sounds.  Musculoskeletal: Strength 5/5 BUE/BLE. No difficulty with gait.  Neurological: Alert and oriented. Cranial nerves II-XII grossly intact. Coordination normal.  Psychiatric: Mood and affect normal. Behavior is normal. Judgment and thought content normal.    BMET    Component Value Date/Time   NA 140 11/30/2022 0838   NA 138 03/21/2017 1018   NA 137 02/18/2013 1554   K 4.3 11/30/2022 0838   K 4.4 02/18/2013 1554   CL 102 11/30/2022 0838   CO2 27 11/30/2022 0838   CO2 25 02/18/2013 1554   GLUCOSE 171 (H) 11/30/2022 0838   GLUCOSE 300 (H) 02/18/2013 1554   BUN 13 11/30/2022 0838   BUN 16 03/21/2017 1018   BUN 14.2 02/18/2013 1554   CREATININE 0.55 11/30/2022 0838   CREATININE 0.8 02/18/2013 1554   CALCIUM  9.5 11/30/2022 0838   CALCIUM  9.2 02/18/2013 1554   GFRNONAA >60 04/05/2022 1752   GFRNONAA >60 05/19/2021 0845   GFRAA >60 08/28/2019 0852   GFRAA >60 07/16/2017 1442    Lipid Panel     Component Value Date/Time   CHOL 128 11/30/2022 0838   CHOL 122 03/21/2017 1018   TRIG 112 11/30/2022 0838   HDL 64 11/30/2022 0838   HDL 41 03/21/2017 1018   CHOLHDL 2.0 11/30/2022 0838   VLDL 23.0 02/03/2020 0854   LDLCALC 44 11/30/2022 0838    CBC    Component Value Date/Time   WBC 9.2 11/30/2022 0838   RBC 4.78 11/30/2022 0838   HGB 12.6 11/30/2022 0838   HGB 13.1 05/19/2021 0845   HGB 13.5 03/21/2017 1018   HGB 12.5 02/18/2013 1554   HCT 40.4 11/30/2022 0838   HCT 42.0  03/21/2017 1018   HCT 39.6 02/18/2013 1554   PLT 295 11/30/2022 0838   PLT 250 05/19/2021 0845   PLT 306 02/18/2013 1554   MCV 84.5 11/30/2022 0838   MCV 86 03/21/2017 1018   MCV 82.6 02/18/2013 1554   MCH 26.4 (L) 11/30/2022 0838   MCHC 31.2 (L) 11/30/2022 0838   RDW 14.4 11/30/2022 0838   RDW 14.6 03/21/2017 1018   RDW 16.4 (H) 02/18/2013 1554   LYMPHSABS 1.4 05/19/2021 0845   LYMPHSABS 1.5 03/21/2017 1018   LYMPHSABS 2.0 02/18/2013 1554   MONOABS 0.5 05/19/2021 0845   MONOABS 0.6 02/18/2013 1554   EOSABS 0.2 05/19/2021 0845   EOSABS 0.1 03/21/2017 1018   BASOSABS 0.0 05/19/2021 0845   BASOSABS 0.0 03/21/2017 1018   BASOSABS 0.0 02/18/2013 1554  Hgb A1C Lab Results  Component Value Date   HGBA1C 6.3 (A) 11/14/2022            Assessment & Plan:   Preventative Health Maintenance:  Encouraged her to get a flu shot in the fall Tetanus declined Encouraged her to get her COVID booster Pneumovax declined Discussed Shingrix vaccine, she will check coverage with her insurance company schedule a nurse visit if she would like to have this done Pap smear UTD Mammogram ordered-she will call to schedule Bone density ordered-she will call to schedule Colon screening UTD Encouraged her to consume a balanced diet and exercise regimen Advised her seeing eye doctor and dentist annually We will check CBC, c-Met, TSH, Free T4, lipid profile, A1C and urine microalbumin today  RTC in 6 months, follow-up chronic conditions Helayne Lo, NP

## 2023-05-31 NOTE — Patient Instructions (Signed)

## 2023-06-01 ENCOUNTER — Encounter: Payer: Self-pay | Admitting: Internal Medicine

## 2023-06-01 ENCOUNTER — Other Ambulatory Visit: Payer: Self-pay

## 2023-06-01 DIAGNOSIS — E890 Postprocedural hypothyroidism: Secondary | ICD-10-CM

## 2023-06-01 LAB — HEMOGLOBIN A1C
Hgb A1c MFr Bld: 6.9 % — ABNORMAL HIGH (ref <5.7)
Mean Plasma Glucose: 151 mg/dL
eAG (mmol/L): 8.4 mmol/L

## 2023-06-01 LAB — COMPREHENSIVE METABOLIC PANEL WITH GFR
AG Ratio: 1.8 (calc) (ref 1.0–2.5)
ALT: 23 U/L (ref 6–29)
AST: 18 U/L (ref 10–35)
Albumin: 4.1 g/dL (ref 3.6–5.1)
Alkaline phosphatase (APISO): 52 U/L (ref 37–153)
BUN: 13 mg/dL (ref 7–25)
CO2: 29 mmol/L (ref 20–32)
Calcium: 9.5 mg/dL (ref 8.6–10.4)
Chloride: 103 mmol/L (ref 98–110)
Creat: 0.6 mg/dL (ref 0.50–1.03)
Globulin: 2.3 g/dL (ref 1.9–3.7)
Glucose, Bld: 147 mg/dL — ABNORMAL HIGH (ref 65–99)
Potassium: 4.5 mmol/L (ref 3.5–5.3)
Sodium: 139 mmol/L (ref 135–146)
Total Bilirubin: 0.4 mg/dL (ref 0.2–1.2)
Total Protein: 6.4 g/dL (ref 6.1–8.1)
eGFR: 104 mL/min/{1.73_m2} (ref >=60)

## 2023-06-01 LAB — CBC
HCT: 36.7 % (ref 35.0–45.0)
Hemoglobin: 11.7 g/dL (ref 11.7–15.5)
MCH: 27.1 pg (ref 27.0–33.0)
MCHC: 31.9 g/dL — ABNORMAL LOW (ref 32.0–36.0)
MCV: 85.2 fL (ref 80.0–100.0)
MPV: 10 fL (ref 7.5–12.5)
Platelets: 303 10*3/uL (ref 140–400)
RBC: 4.31 10*6/uL (ref 3.80–5.10)
RDW: 14.2 % (ref 11.0–15.0)
WBC: 6.7 10*3/uL (ref 3.8–10.8)

## 2023-06-01 LAB — LIPID PANEL
Cholesterol: 113 mg/dL (ref <200)
HDL: 48 mg/dL — ABNORMAL LOW (ref >=50)
LDL Cholesterol (Calc): 45 mg/dL
Non-HDL Cholesterol (Calc): 65 mg/dL (ref <130)
Total CHOL/HDL Ratio: 2.4 (calc) (ref <5.0)
Triglycerides: 114 mg/dL (ref <150)

## 2023-06-01 LAB — MICROALBUMIN / CREATININE URINE RATIO
Creatinine, Urine: 82 mg/dL (ref 20–275)
Microalb Creat Ratio: 9 mg/g{creat} (ref <30)
Microalb, Ur: 0.7 mg/dL

## 2023-06-01 LAB — TSH: TSH: 0.13 m[IU]/L — ABNORMAL LOW (ref 0.40–4.50)

## 2023-06-01 LAB — T4, FREE: Free T4: 1.9 ng/dL — ABNORMAL HIGH (ref 0.8–1.8)

## 2023-06-01 MED ORDER — LEVOTHYROXINE SODIUM 100 MCG PO TABS
100.0000 ug | ORAL_TABLET | Freq: Every day | ORAL | 0 refills | Status: DC
  Filled 2023-06-01: qty 90, 90d supply, fill #0

## 2023-06-04 ENCOUNTER — Other Ambulatory Visit: Payer: Self-pay

## 2023-06-18 ENCOUNTER — Other Ambulatory Visit: Payer: Self-pay | Admitting: Internal Medicine

## 2023-06-20 ENCOUNTER — Other Ambulatory Visit (HOSPITAL_BASED_OUTPATIENT_CLINIC_OR_DEPARTMENT_OTHER): Payer: Self-pay

## 2023-06-20 ENCOUNTER — Other Ambulatory Visit: Payer: Self-pay

## 2023-06-21 ENCOUNTER — Other Ambulatory Visit: Payer: Self-pay

## 2023-06-21 MED ORDER — LOSARTAN POTASSIUM 50 MG PO TABS
50.0000 mg | ORAL_TABLET | Freq: Every day | ORAL | 1 refills | Status: DC
  Filled 2023-06-21: qty 90, 90d supply, fill #0
  Filled 2023-09-17: qty 90, 90d supply, fill #1

## 2023-06-21 NOTE — Telephone Encounter (Signed)
 Requested Prescriptions  Pending Prescriptions Disp Refills   losartan  (COZAAR ) 50 MG tablet 90 tablet 1    Sig: Take 1 tablet (50 mg total) by mouth daily.     Cardiovascular:  Angiotensin Receptor Blockers Failed - 06/21/2023 12:41 PM      Failed - Valid encounter within last 6 months    Recent Outpatient Visits           3 weeks ago Encounter for general adult medical examination with abnormal findings   [facility] [place], Rankin Buzzard, NP              Passed - Cr in normal range and within 180 days    Creatinine  Date Value Ref Range Status  02/18/2013 0.8 0.6 - 1.1 mg/dL Final   Creat  Date Value Ref Range Status  05/31/2023 0.60 0.50 - 1.03 mg/dL Final   Creatinine,U  Date Value Ref Range Status  06/07/2021 51.9 mg/dL Final   Creatinine, Urine  Date Value Ref Range Status  05/31/2023 82 20 - 275 mg/dL Final         Passed - K in normal range and within 180 days    Potassium  Date Value Ref Range Status  05/31/2023 4.5 3.5 - 5.3 mmol/L Final  02/18/2013 4.4 3.5 - 5.1 mEq/L Final         Passed - Patient is not pregnant      Passed - Last BP in normal range    BP Readings from Last 1 Encounters:  05/31/23 118/78

## 2023-06-25 ENCOUNTER — Other Ambulatory Visit: Payer: Self-pay

## 2023-06-25 ENCOUNTER — Other Ambulatory Visit: Payer: Self-pay | Admitting: Internal Medicine

## 2023-06-25 ENCOUNTER — Encounter: Payer: Self-pay | Admitting: Internal Medicine

## 2023-06-25 MED ORDER — DILTIAZEM HCL ER COATED BEADS 120 MG PO CP24
120.0000 mg | ORAL_CAPSULE | Freq: Every day | ORAL | 1 refills | Status: DC
Start: 2023-06-25 — End: 2023-12-17
  Filled 2023-06-25: qty 90, 90d supply, fill #0
  Filled 2023-07-02 – 2023-09-25 (×2): qty 90, 90d supply, fill #1

## 2023-06-26 ENCOUNTER — Other Ambulatory Visit: Payer: Self-pay

## 2023-06-26 MED ORDER — FREESTYLE LIBRE 3 PLUS SENSOR MISC
0 refills | Status: DC
  Filled 2023-06-26: qty 6, 90d supply, fill #0

## 2023-07-02 ENCOUNTER — Other Ambulatory Visit: Payer: Self-pay

## 2023-07-02 MED FILL — Pantoprazole Sodium EC Tab 40 MG (Base Equiv): ORAL | 30 days supply | Qty: 30 | Fill #1 | Status: AC

## 2023-07-16 ENCOUNTER — Ambulatory Visit

## 2023-07-23 ENCOUNTER — Other Ambulatory Visit

## 2023-07-23 ENCOUNTER — Ambulatory Visit

## 2023-07-31 ENCOUNTER — Other Ambulatory Visit: Payer: Self-pay

## 2023-07-31 ENCOUNTER — Other Ambulatory Visit: Payer: Self-pay | Admitting: Internal Medicine

## 2023-07-31 DIAGNOSIS — I25118 Atherosclerotic heart disease of native coronary artery with other forms of angina pectoris: Secondary | ICD-10-CM

## 2023-07-31 MED FILL — Metoprolol Tartrate Tab 100 MG: ORAL | 90 days supply | Qty: 180 | Fill #0 | Status: AC

## 2023-08-01 ENCOUNTER — Other Ambulatory Visit: Payer: Self-pay

## 2023-08-02 ENCOUNTER — Other Ambulatory Visit: Payer: Self-pay

## 2023-08-02 MED FILL — Rosuvastatin Calcium Tab 20 MG: ORAL | 90 days supply | Qty: 90 | Fill #0 | Status: AC

## 2023-08-02 NOTE — Telephone Encounter (Signed)
 Requested Prescriptions  Pending Prescriptions Disp Refills   rosuvastatin  (CRESTOR ) 20 MG tablet 90 tablet 1    Sig: Take 1 tablet (20 mg total) by mouth daily.     Cardiovascular:  Antilipid - Statins 2 Failed - 08/02/2023  2:21 PM      Failed - Lipid Panel in normal range within the last 12 months    Cholesterol, Total  Date Value Ref Range Status  03/21/2017 122 100 - 199 mg/dL Final   Cholesterol  Date Value Ref Range Status  05/31/2023 113 <200 mg/dL Final   LDL Cholesterol (Calc)  Date Value Ref Range Status  05/31/2023 45 mg/dL (calc) Final    Comment:    Reference range: <100 . Desirable range <100 mg/dL for primary prevention;   <70 mg/dL for patients with CHD or diabetic patients  with > or = 2 CHD risk factors. SABRA LDL-C is now calculated using the Martin-Hopkins  calculation, which is a validated novel method providing  better accuracy than the Friedewald equation in the  estimation of LDL-C.  Gladis APPLETHWAITE et al. SANDREA. 7986;689(80): 2061-2068  (http://education.[facility].com/faq/FAQ164)    Direct LDL  Date Value Ref Range Status  09/01/2014 153.0 mg/dL Final    Comment:    Optimal:  <100 mg/dLNear or Above Optimal:  100-129 mg/dLBorderline High:  130-159 mg/dLHigh:  160-189 mg/dLVery High:  >190 mg/dL   HDL  Date Value Ref Range Status  05/31/2023 48 (L) > OR = 50 mg/dL Final  97/72/7980 41 >60 mg/dL Final   Triglycerides  Date Value Ref Range Status  05/31/2023 114 <150 mg/dL Final         Passed - Cr in normal range and within 360 days    Creatinine  Date Value Ref Range Status  02/18/2013 0.8 0.6 - 1.1 mg/dL Final   Creat  Date Value Ref Range Status  05/31/2023 0.60 0.50 - 1.03 mg/dL Final   Creatinine,U  Date Value Ref Range Status  01/12/2009 122.4 mg/dL Final   Creatinine, Urine  Date Value Ref Range Status  05/31/2023 82 20 - 275 mg/dL Final         Passed - Patient is not pregnant      Passed - Valid encounter within last  12 months    Recent Outpatient Visits           2 months ago Encounter for general adult medical examination with abnormal findings   [place] [facility] [place], Angeline ORN, NP

## 2023-08-07 ENCOUNTER — Other Ambulatory Visit: Payer: Self-pay | Admitting: "Endocrinology

## 2023-08-07 MED FILL — Pantoprazole Sodium EC Tab 40 MG (Base Equiv): ORAL | 30 days supply | Qty: 30 | Fill #2 | Status: AC

## 2023-08-08 ENCOUNTER — Other Ambulatory Visit: Payer: Self-pay

## 2023-08-08 MED ORDER — OZEMPIC (2 MG/DOSE) 8 MG/3ML ~~LOC~~ SOPN
2.0000 mg | PEN_INJECTOR | SUBCUTANEOUS | 0 refills | Status: DC
  Filled 2023-08-08: qty 9, 84d supply, fill #0

## 2023-08-08 NOTE — Telephone Encounter (Signed)
 Requested Prescriptions   Pending Prescriptions Disp Refills   Semaglutide , 2 MG/DOSE, (OZEMPIC , 2 MG/DOSE,) 8 MG/3ML SOPN 9 mL 0    Sig: Inject 2 mg into the skin once a week.

## 2023-08-16 ENCOUNTER — Other Ambulatory Visit: Payer: Self-pay

## 2023-08-17 ENCOUNTER — Other Ambulatory Visit: Payer: Self-pay

## 2023-08-28 ENCOUNTER — Other Ambulatory Visit: Payer: Self-pay | Admitting: Internal Medicine

## 2023-08-29 ENCOUNTER — Ambulatory Visit (INDEPENDENT_AMBULATORY_CARE_PROVIDER_SITE_OTHER): Payer: 59 | Admitting: Podiatry

## 2023-08-29 ENCOUNTER — Other Ambulatory Visit: Payer: Self-pay

## 2023-08-29 ENCOUNTER — Ambulatory Visit
Admission: RE | Admit: 2023-08-29 | Discharge: 2023-08-29 | Disposition: A | Discharge status: Home / Self Care | Source: Ambulatory Visit | Attending: Internal Medicine | Admitting: Internal Medicine

## 2023-08-29 DIAGNOSIS — Z1231 Encounter for screening mammogram for malignant neoplasm of breast: Secondary | ICD-10-CM

## 2023-08-29 DIAGNOSIS — E1142 Type 2 diabetes mellitus with diabetic polyneuropathy: Secondary | ICD-10-CM

## 2023-08-29 NOTE — Progress Notes (Signed)
 She presents today for follow-up of her diabetes and would like to have her feet looked at.  She states that all in all she is doing pretty well she feels that her blood sugar is improving with a hemoglobin A1c of 6.4%.  Has recently got to a new endocrinologist.  She states that her subfifth metatarsal is bothersome occasionally.  Objective: Vital signs are stable she is alert oriented x 3 pulses are palpable.  Neurologic sensorium is intact prep receptively and per [place] Speed monofilament.  Muscle strength is normal and symmetrical bilateral.  Deep tendon reflexes are intact muscle strength is normal and symmetrical.  Orthopedic evaluation demonstrates all joints distal to the ankle level full range of motion without crepitation.  Cutaneous evaluation does demonstrate some mild xerosis no open lesions or wounds.  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  Plan: Follow-up with her in 6 months

## 2023-08-30 ENCOUNTER — Other Ambulatory Visit: Payer: Self-pay

## 2023-08-30 ENCOUNTER — Encounter: Payer: Self-pay | Admitting: Internal Medicine

## 2023-08-30 ENCOUNTER — Other Ambulatory Visit: Payer: Self-pay | Admitting: Internal Medicine

## 2023-08-30 MED ORDER — LEVOTHYROXINE SODIUM 100 MCG PO TABS
100.0000 ug | ORAL_TABLET | Freq: Every day | ORAL | 0 refills | Status: DC
  Filled 2023-08-30: qty 90, 90d supply, fill #0

## 2023-09-09 MED FILL — Pantoprazole Sodium EC Tab 40 MG (Base Equiv): ORAL | 30 days supply | Qty: 30 | Fill #3 | Status: AC

## 2023-09-30 ENCOUNTER — Other Ambulatory Visit: Payer: Self-pay

## 2023-09-30 ENCOUNTER — Other Ambulatory Visit: Payer: Self-pay | Admitting: Internal Medicine

## 2023-10-01 ENCOUNTER — Other Ambulatory Visit: Payer: Self-pay | Admitting: Internal Medicine

## 2023-10-01 ENCOUNTER — Other Ambulatory Visit: Payer: Self-pay

## 2023-10-02 ENCOUNTER — Other Ambulatory Visit: Payer: Self-pay

## 2023-10-02 MED FILL — Continuous Glucose System Sensor: 90 days supply | Qty: 6 | Fill #0 | Status: AC

## 2023-10-02 NOTE — Telephone Encounter (Signed)
 Requested medication (s) are due for refill today:   Yes  Requested medication (s) are on the active medication list:   Yes  Future visit scheduled:   Yes 11/11   Last ordered: 06/26/2023 6 each, 0 refills  Unable to refill because no protocol assigned    Requested Prescriptions  Pending Prescriptions Disp Refills   Continuous Glucose Sensor (FREESTYLE LIBRE 3 PLUS SENSOR) MISC [Pharmacy Med Name: Continuous Glucose Sensor (FREESTYLE LIBRE 3 PLUS SENSOR) Misc] 6 each 0    Sig: Change sensor every 15 days.     There is no refill protocol information for this order

## 2023-10-03 ENCOUNTER — Other Ambulatory Visit: Payer: Self-pay

## 2023-10-04 ENCOUNTER — Other Ambulatory Visit: Payer: Self-pay

## 2023-10-04 DIAGNOSIS — H0014 Chalazion left upper eyelid: Secondary | ICD-10-CM | POA: Diagnosis not present

## 2023-10-04 MED ORDER — NEOMYCIN-POLYMYXIN-DEXAMETH 3.5-10000-0.1 OP SUSP
1.0000 [drp] | Freq: Three times a day (TID) | OPHTHALMIC | 0 refills | Status: DC
  Filled 2023-10-04: qty 5, 34d supply, fill #0

## 2023-10-04 MED ORDER — NEOMYCIN-POLYMYXIN-DEXAMETH 3.5-10000-0.1 OP OINT
TOPICAL_OINTMENT | OPHTHALMIC | 0 refills | Status: DC
  Filled 2023-10-04: qty 3.5, 7d supply, fill #0

## 2023-10-08 ENCOUNTER — Other Ambulatory Visit: Payer: Self-pay | Admitting: Internal Medicine

## 2023-10-08 ENCOUNTER — Other Ambulatory Visit: Payer: Self-pay

## 2023-10-09 ENCOUNTER — Other Ambulatory Visit: Payer: Self-pay

## 2023-10-09 MED FILL — Pantoprazole Sodium EC Tab 40 MG (Base Equiv): ORAL | 90 days supply | Qty: 90 | Fill #0 | Status: AC

## 2023-10-09 NOTE — Telephone Encounter (Signed)
 Requested Prescriptions  Pending Prescriptions Disp Refills   pantoprazole  (PROTONIX ) 40 MG tablet 90 tablet 1    Sig: Take 1 tablet (40 mg total) by mouth daily.     Gastroenterology: Proton Pump Inhibitors Passed - 10/09/2023  5:37 PM      Passed - Valid encounter within last 12 months    Recent Outpatient Visits           4 months ago Encounter for general adult medical examination with abnormal findings   [place] [facility] [place], Angeline ORN, NP

## 2023-10-10 ENCOUNTER — Other Ambulatory Visit: Payer: Self-pay

## 2023-10-30 ENCOUNTER — Other Ambulatory Visit: Payer: Self-pay

## 2023-10-30 MED FILL — Pantoprazole Sodium EC Tab 40 MG (Base Equiv): ORAL | 90 days supply | Qty: 90 | Fill #1 | Status: CN

## 2023-10-30 MED FILL — Rosuvastatin Calcium Tab 20 MG: ORAL | 90 days supply | Qty: 90 | Fill #1 | Status: AC

## 2023-11-01 ENCOUNTER — Other Ambulatory Visit: Payer: Self-pay

## 2023-11-07 ENCOUNTER — Other Ambulatory Visit: Payer: Self-pay | Admitting: "Endocrinology

## 2023-11-08 ENCOUNTER — Other Ambulatory Visit: Payer: Self-pay

## 2023-11-08 MED ORDER — OZEMPIC (2 MG/DOSE) 8 MG/3ML ~~LOC~~ SOPN
2.0000 mg | PEN_INJECTOR | SUBCUTANEOUS | 0 refills | Status: DC
  Filled 2023-11-08: qty 3, 28d supply, fill #0

## 2023-11-09 ENCOUNTER — Other Ambulatory Visit: Payer: Self-pay

## 2023-11-13 ENCOUNTER — Other Ambulatory Visit: Payer: Self-pay

## 2023-11-25 ENCOUNTER — Other Ambulatory Visit: Payer: Self-pay | Admitting: Internal Medicine

## 2023-11-26 ENCOUNTER — Other Ambulatory Visit: Payer: Self-pay

## 2023-11-26 ENCOUNTER — Other Ambulatory Visit: Payer: Self-pay | Admitting: Internal Medicine

## 2023-11-27 ENCOUNTER — Other Ambulatory Visit: Payer: Self-pay

## 2023-11-27 ENCOUNTER — Encounter: Payer: Self-pay | Admitting: Internal Medicine

## 2023-11-27 MED ORDER — LEVOTHYROXINE SODIUM 100 MCG PO TABS
100.0000 ug | ORAL_TABLET | Freq: Every day | ORAL | 0 refills | Status: DC
  Filled 2023-11-27: qty 90, 90d supply, fill #0

## 2023-12-04 ENCOUNTER — Ambulatory Visit: Admitting: Internal Medicine

## 2023-12-04 ENCOUNTER — Encounter: Payer: Self-pay | Admitting: Internal Medicine

## 2023-12-04 VITALS — BP 134/80 | Ht 64.0 in | Wt 172.0 lb

## 2023-12-04 DIAGNOSIS — Z7985 Long-term (current) use of injectable non-insulin antidiabetic drugs: Secondary | ICD-10-CM | POA: Diagnosis not present

## 2023-12-04 DIAGNOSIS — E1169 Type 2 diabetes mellitus with other specified complication: Secondary | ICD-10-CM | POA: Diagnosis not present

## 2023-12-04 DIAGNOSIS — I471 Supraventricular tachycardia, unspecified: Secondary | ICD-10-CM

## 2023-12-04 DIAGNOSIS — K219 Gastro-esophageal reflux disease without esophagitis: Secondary | ICD-10-CM

## 2023-12-04 DIAGNOSIS — F32A Depression, unspecified: Secondary | ICD-10-CM

## 2023-12-04 DIAGNOSIS — E663 Overweight: Secondary | ICD-10-CM | POA: Diagnosis not present

## 2023-12-04 DIAGNOSIS — E1142 Type 2 diabetes mellitus with diabetic polyneuropathy: Secondary | ICD-10-CM | POA: Diagnosis not present

## 2023-12-04 DIAGNOSIS — Z85850 Personal history of malignant neoplasm of thyroid: Secondary | ICD-10-CM

## 2023-12-04 DIAGNOSIS — Z23 Encounter for immunization: Secondary | ICD-10-CM

## 2023-12-04 DIAGNOSIS — Z7984 Long term (current) use of oral hypoglycemic drugs: Secondary | ICD-10-CM | POA: Diagnosis not present

## 2023-12-04 DIAGNOSIS — E1159 Type 2 diabetes mellitus with other circulatory complications: Secondary | ICD-10-CM

## 2023-12-04 DIAGNOSIS — I25118 Atherosclerotic heart disease of native coronary artery with other forms of angina pectoris: Secondary | ICD-10-CM

## 2023-12-04 DIAGNOSIS — I152 Hypertension secondary to endocrine disorders: Secondary | ICD-10-CM

## 2023-12-04 DIAGNOSIS — Z853 Personal history of malignant neoplasm of breast: Secondary | ICD-10-CM

## 2023-12-04 DIAGNOSIS — Z6829 Body mass index (BMI) 29.0-29.9, adult: Secondary | ICD-10-CM

## 2023-12-04 DIAGNOSIS — F419 Anxiety disorder, unspecified: Secondary | ICD-10-CM

## 2023-12-04 DIAGNOSIS — E039 Hypothyroidism, unspecified: Secondary | ICD-10-CM

## 2023-12-04 DIAGNOSIS — E785 Hyperlipidemia, unspecified: Secondary | ICD-10-CM

## 2023-12-04 NOTE — Assessment & Plan Note (Signed)
 Continue diltiazem  120 mg, losartan  50 mg and metoprolol  100 mg daily (prescribed twice daily). C-Met today Reinforced DASH diet and exercise for weight loss

## 2023-12-04 NOTE — Progress Notes (Signed)
 Subjective:    Patient ID: Stacey Roberts, female    DOB: 30-Nov-1965, 58 y.o.   MRN: [id number]  HPI  Patient presents to clinic today for 47-month follow-up of chronic conditions.  History of breast cancer: Status postlumpectomy, radiation, reduction and reconstruction.  She no longer follows with oncology.  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  She is taking dapagliflozin -metformin , semaglutide  as prescribed.  She is no longer taking semglee .  She is no longer taking taking pregabalin  for neuropathic pain.  Her sugars range 60-180.  She follows with podiatry and endocrinology.  Her last eye exam was 04/2023.  Flu 10/2023.  Pneumovax 2007.  COVID x 2.  GERD: Triggered by everything I eat.  She denies breakthrough on pantoprazole  (every other day).  There is no upper GI on file.  HLD with CAD: s/p stent.Her last LDL was 45, triglycerides 885, 05/2023.  She denies myalgias on rosuvastatin  (every other day). She is taking aspirin , metoprolol   as well. She is not longer taking clopigderal. She tries to consume a low-fat diet.  She follows with cardiology.  HTN/PSVT: Her BP today is 134/80.  She is taking diltiazem , losartan  and metoprolol  as prescribed.  ECG from 10/2022 reviewed.  Hypothyroidism: Postsurgical secondary to thyroid  cancer.  She denies any issues on her current dose of levothyroxine .  She follows with endocrinology.  Anxiety and depression: Chronic, however she is not currently taking any medications for this.  She is not seeing a therapist.  She denies SI/HI.  Review of Systems  Past Medical History:  Diagnosis Date   Allergy    seasonal   BRCA negative 2017   Breast cancer (HCC) 2014   Breast cancer (HCC) 2017   Bronchitis    hx of   Coronary artery disease 06/2019   80% mid RCA stenosis - medical management   Diabetes mellitus, type II (HCC)    Endometrial hyperplasia 2014   Mirena placed; Dr. 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 Faythe    Current Outpatient Medications  Medication Sig Dispense Refill   aspirin  EC 81 MG tablet Take 1 tablet (81 mg total) by mouth daily. 90 tablet 3   Blood Glucose Monitoring Suppl (FREESTYLE FREEDOM LITE) w/Device KIT test twice daily 1 kit 0   Continuous Glucose Sensor (FREESTYLE LIBRE 3 PLUS SENSOR) MISC Use to check blood sugar and change sensor every 15 days. 6 each 0   Dapagliflozin  Pro-metFORMIN  ER (XIGDUO  XR) 2.05-998 MG TB24 Take 2 tablets by mouth daily. 180 tablet 1   diltiazem  (CARDIZEM  CD) 120 MG 24 hr capsule Take 1 capsule (120 mg total) by mouth daily. Please contact office for future appointment and refills, Thank you [phone] 90 capsule 1   glucose blood (FREESTYLE LITE) test strip Use 2 (  two) times daily. (dx [name]) 50 each 0   Lancets (FREESTYLE) lancets 1 each by Other route 2 (two) times daily. [name] 200 each 0   levothyroxine  (SYNTHROID ) 100 MCG tablet Take 1 tablet (100 mcg total) by mouth daily. 90 tablet 0   losartan  (COZAAR ) 50 MG tablet Take 1 tablet (50 mg total) by mouth daily. 90 tablet 1   metoprolol  tartrate (LOPRESSOR ) 100 MG tablet Take 1 tablet (100 mg total) by mouth 2 (two) times daily. 180 tablet 0   Multiple Vitamins-Minerals (MULTIVITAMIN WITH MINERALS) tablet Take 1 tablet by mouth daily. Woman's Gummies     neomycin -polymyxin b-dexamethasone  (MAXITROL ) 3.5-10000-0.1 OINT Apply 1 a small amount  into affected eye at bedtime. 3.5 g 0   neomycin -polymyxin b-dexamethasone  (MAXITROL ) 3.5-10000-0.1 SUSP Place 1 drop into both eyes 3 (three) times daily. Shake bottle befroe each use. 5 mL 0   nitroGLYCERIN  (NITROSTAT ) 0.4 MG SL tablet Place 1 tablet (0.4 mg total) under the tongue every 5 (five) minutes as needed for chest pain. 25 tablet prn   pantoprazole  (PROTONIX ) 40 MG tablet Take 1 tablet (40 mg total) by mouth daily. 90 tablet 1   rosuvastatin  (CRESTOR ) 20 MG tablet Take 1 tablet (20 mg total) by mouth daily. 90 tablet 1   Semaglutide , 2 MG/DOSE, (OZEMPIC , 2 MG/DOSE,) 8 MG/3ML SOPN Inject 2 mg into the skin once a week. 3 mL 0   No current facility-administered medications for this visit.    Allergies  Allergen Reactions   Ace Inhibitors Cough    REACTION: dry cough   Penicillins Shortness Of Breath and Rash    Pt has tolerated Cephalexin  in 2017 and again in 2018.   Has patient had a PCN reaction causing immediate rash, facial/tongue/throat swelling, SOB or lightheadedness with hypotension: Yes Has patient had a PCN reaction causing severe rash involving mucus membranes or skin necrosis: Yes Has patient had a PCN reaction that required hospitalization No Has patient had a PCN reaction occurring within the last 10 years: No If all of the above answers are NO, then may proceed with Cephalosporin use.    Clindamycin /Lincomycin Other (See Comments)    Severe stomach cramps   Ciprofloxacin  Itching    Family History  Problem Relation Age of Onset   CAD Mother 54       Died age 30 of CAD   Heart disease Mother        CABG x 2   Graves' disease Mother    Hypertension Mother    Thyroid  disease Mother    Heart attack Mother 41   Dementia Father        deceased age 22 secondary to dementia   Bipolar disorder Father    Emphysema Father    Heart disease Father    COPD Father        smoker   Hypertension Father    Depression Father    Alcoholism Father    Other Sister  23       hysterectomy for fibroids   Heart disease Sister        CABG x 2   Heart attack Sister 20   Prostate cancer Brother        low grade; w/ surveillance   Thyroid  nodules Brother 64   Heart disease Brother 34       CABG x 4   CAD Maternal Grandfather 60   Heart disease Maternal Grandfather    Heart attack Maternal Grandfather 73   Kidney  cancer Cousin        maternal 1st cousin dx 43-47; former smoker   Fibroids Other        niece dx approx 56   Hypertension Other    Lung cancer Other        maternal great aunt (MGM's sister); not a smoker   Cancer Other        nephew dx neuroblastoma at 22.56 years old   Colon cancer Neg Hx    Colon polyps Neg Hx     Social History   Socioeconomic History   Marital status: Single    Spouse name: Not on file   Number of children: Not on file   Years of education: Not on file   Highest education level: 12th grade  Occupational History    Employer: [employer]    Comment: Outpatient scheduling in radiology  Tobacco Use   Smoking status: Former    Current packs/day: 0.00    Types: Cigarettes    Quit date: 09/29/2007    Years since quitting: 16.1   Smokeless tobacco: Never   Tobacco comments:    Smoked on and off for 20 years. Quit about 8 years ago (as of 08/2015)  Vaping Use   Vaping status: Never Used  Substance and Sexual Activity   Alcohol use: Not Currently    Alcohol/week: 0.0 standard drinks of alcohol   Drug use: No   Sexual activity: Not Currently    Birth control/protection: Post-menopausal  Other Topics Concern   Not on file  Social History Narrative   The patient is divorced, moved to [place]  in 2006 from [place].  She does not have any children, currently liveswith her boyfriend and is working as a chief technology officer for [employer].  No alcohol.  Tobacco use:  She quit 5 years ago.  She smoked onand off for approximately 20 years.  No history of recreational drugUse. Drinks two cups of coffee per work  day.    Social Drivers of Corporate Investment Banker Strain: Low Risk  (11/27/2022)   Overall Financial Resource Strain (CARDIA)    Difficulty of Paying Living Expenses: Not very hard  Food Insecurity: No Food Insecurity (11/27/2022)   Hunger Vital Sign    Worried About Running Out of Food in the Last Year: Never true    Ran Out of Food in the Last Year: Never true  Transportation Needs: No Transportation Needs (11/27/2022)   PRAPARE - Administrator, Civil Service (Medical): No    Lack of Transportation (Non-Medical): No  Physical Activity: Insufficiently Active (11/27/2022)   Exercise Vital Sign    Days of Exercise per Week: 3 days    Minutes of Exercise per Session: 40 min  Stress: Stress Concern Present (11/27/2022)   [employer] of Occupational Health - Occupational Stress Questionnaire    Feeling of Stress : To some extent  Social Connections: Socially Isolated (11/27/2022)   Social Connection and Isolation Panel    Frequency of Communication with Friends and Family: Twice a week    Frequency of Social Gatherings with Friends and Family: More than three times a week    Attends Religious Services: Never    Database Administrator or Organizations: No    Attends Engineer, Structural: Not on file    Marital Status: Divorced  Intimate Partner Violence: Not on file     Constitutional: Denies fever, malaise, fatigue, headache or abrupt weight changes.  HEENT:  Denies eye pain, eye redness, ear pain, ringing in the ears, wax buildup, runny nose, nasal congestion, bloody nose, or sore throat. Respiratory: Denies difficulty breathing, shortness of breath, cough or sputum production.   Cardiovascular: Denies chest pain, chest tightness, palpitations or swelling in the hands or feet.  Gastrointestinal: Pt reports alternating constipation or diarrhea (medication induced). Denies abdominal pain, bloating, or blood in the stool.  GU: Denies urgency, frequency, pain  with urination, burning sensation, blood in urine, odor or discharge. Musculoskeletal: Patient reports intermittent leg pain.  Denies difficulty with gait, or joint pain or swelling.  Skin: Denies redness, rashes, lesions or ulcercations.  Neurological: Denies dizziness, difficulty with memory, difficulty with speech or problems with balance and coordination.  Psych: Patient has a history of anxiety and depression.  Denies SI/HI.  No other specific complaints in a complete review of systems (except as listed in HPI above).     Objective:   Physical Exam BP 134/80 (BP Location: Right Arm, Patient Position: Sitting, Cuff Size: Normal)   Ht 5' 4 (1.626 m)   Wt 172 lb (78 kg)   LMP 06/24/2010 (Approximate)   BMI 29.52 kg/m    Wt Readings from Last 3 Encounters:  05/31/23 190 lb 9.6 oz (86.5 kg)  11/30/22 210 lb (95.3 kg)  11/14/22 207 lb 3.2 oz (94 kg)    General: Appears her stated age, overweight, in NAD. Skin: Warm, dry and intact. No ulcerations noted. HEENT: Head: normal shape and size; Eyes: sclera white, no icterus, conjunctiva pink, PERRLA and EOMs intact;  Neck:  Neck supple, trachea midline. No masses, lumps or thyromegaly present.  Cardiovascular: Normal rate and rhythm. S1,S2 noted.  No murmur, rubs or gallops noted. No JVD or BLE edema. No carotid bruits noted. Pulmonary/Chest: Normal effort and positive vesicular breath sounds. No respiratory distress. No wheezes, rales or ronchi noted.  Abdomen: Soft and nontender. Normal bowel sounds.  Musculoskeletal: Strength 5/5 BUE.  No difficulty with gait.  Neurological: Alert and oriented. Coordination normal.  Psychiatric: Mood and affect normal. Behavior is normal. Judgment and thought content normal.   BMET    Component Value Date/Time   NA 139 05/31/2023 0831   NA 138 03/21/2017 1018   NA 137 02/18/2013 1554   K 4.5 05/31/2023 0831   K 4.4 02/18/2013 1554   CL 103 05/31/2023 0831   CO2 29 05/31/2023 0831   CO2 25  02/18/2013 1554   GLUCOSE 147 (H) 05/31/2023 0831   GLUCOSE 300 (H) 02/18/2013 1554   BUN 13 05/31/2023 0831   BUN 16 03/21/2017 1018   BUN 14.2 02/18/2013 1554   CREATININE 0.60 05/31/2023 0831   CREATININE 0.8 02/18/2013 1554   CALCIUM  9.5 05/31/2023 0831   CALCIUM  9.2 02/18/2013 1554   GFRNONAA >60 04/05/2022 1752   GFRNONAA >60 05/19/2021 0845   GFRAA >60 08/28/2019 0852   GFRAA >60 07/16/2017 1442    Lipid Panel     Component Value Date/Time   CHOL 113 05/31/2023 0831   CHOL 122 03/21/2017 1018   TRIG 114 05/31/2023 0831   HDL 48 (L) 05/31/2023 0831   HDL 41 03/21/2017 1018   CHOLHDL 2.4 05/31/2023 0831   VLDL 23.0 02/03/2020 0854   LDLCALC 45 05/31/2023 0831    CBC    Component Value Date/Time   WBC 6.7 05/31/2023 0831   RBC 4.31 05/31/2023 0831   HGB 11.7 05/31/2023 0831   HGB 13.1 05/19/2021 0845   HGB 13.5 03/21/2017 1018  HGB 12.5 02/18/2013 1554   HCT 36.7 05/31/2023 0831   HCT 42.0 03/21/2017 1018   HCT 39.6 02/18/2013 1554   PLT 303 05/31/2023 0831   PLT 250 05/19/2021 0845   PLT 306 02/18/2013 1554   MCV 85.2 05/31/2023 0831   MCV 86 03/21/2017 1018   MCV 82.6 02/18/2013 1554   MCH 27.1 05/31/2023 0831   MCHC 31.9 (L) 05/31/2023 0831   RDW 14.2 05/31/2023 0831   RDW 14.6 03/21/2017 1018   RDW 16.4 (H) 02/18/2013 1554   LYMPHSABS 1.4 05/19/2021 0845   LYMPHSABS 1.5 03/21/2017 1018   LYMPHSABS 2.0 02/18/2013 1554   MONOABS 0.5 05/19/2021 0845   MONOABS 0.6 02/18/2013 1554   EOSABS 0.2 05/19/2021 0845   EOSABS 0.1 03/21/2017 1018   BASOSABS 0.0 05/19/2021 0845   BASOSABS 0.0 03/21/2017 1018   BASOSABS 0.0 02/18/2013 1554    Hgb A1C Lab Results  Component Value Date   HGBA1C 6.9 (H) 05/31/2023            Assessment & Plan:      RTC in 6 months for your annual exam Angeline Laura, NP

## 2023-12-04 NOTE — Assessment & Plan Note (Signed)
 A1c today Urine microalbumin has been checked within the last year Encouraged low-carb diet and exercise for weight loss Continue dapagliflozin -metformin  2.05-998 mg (2 tabs) Consider weaning semaglutide  to 1 mg weekly pending labs.  She thinks she would like to eventually get off this medication Encouraged routine eye exam Encouraged routine foot exam Flu shot UTD Pneumovax UTD She declines Prevnar

## 2023-12-04 NOTE — Assessment & Plan Note (Signed)
 TSH and free T4 today Continue levothyroxine  100 mcg daily, will adjust if needed based on labs

## 2023-12-04 NOTE — Assessment & Plan Note (Signed)
 No angina C-Met and lipid profile today Encouraged her to consume a low-fat diet Continue rosuvastatin  20 mg every other day (prescribed daily), metoprolol  100 mg daily (prescribed twice daily), and aspirin  81 mg daily

## 2023-12-04 NOTE — Assessment & Plan Note (Signed)
 Encouraged diet and exercise for weight loss ?

## 2023-12-04 NOTE — Patient Instructions (Signed)

## 2023-12-04 NOTE — Assessment & Plan Note (Signed)
 Controlled on metoprolol  100 mg daily (prescribed twice daily). She will continue to follow with cardiology

## 2023-12-04 NOTE — Assessment & Plan Note (Signed)
Stable off meds We will monitor

## 2023-12-04 NOTE — Assessment & Plan Note (Signed)
 In remission No longer following with oncology

## 2023-12-04 NOTE — Assessment & Plan Note (Signed)
 Status post thyroidectomy TSH and free T4 today Continue levothyroxine  100 mcg daily, will adjust if needed based on labs No longer following with oncology

## 2023-12-04 NOTE — Assessment & Plan Note (Signed)
 C-Met and lipid profile today Encouraged her to consume a low-fat diet Continue rosuvastatin  20 mg every other day (prescribed daily)

## 2023-12-04 NOTE — Assessment & Plan Note (Signed)
 Encouraged her to avoid foods that trigger her reflux Encourage weight loss as this can help reduce reflux symptoms Continue pantoprazole  40 mg every other day (prescribed daily)

## 2023-12-05 ENCOUNTER — Ambulatory Visit: Payer: Self-pay | Admitting: Internal Medicine

## 2023-12-05 LAB — CBC
HCT: 39.9 % (ref 35.0–45.0)
Hemoglobin: 12.9 g/dL (ref 11.7–15.5)
MCH: 27.7 pg (ref 27.0–33.0)
MCHC: 32.3 g/dL (ref 32.0–36.0)
MCV: 85.6 fL (ref 80.0–100.0)
MPV: 10.1 fL (ref 7.5–12.5)
Platelets: 307 Thousand/uL (ref 140–400)
RBC: 4.66 Million/uL (ref 3.80–5.10)
RDW: 13.8 % (ref 11.0–15.0)
WBC: 8.9 Thousand/uL (ref 3.8–10.8)

## 2023-12-05 LAB — COMPREHENSIVE METABOLIC PANEL WITH GFR
AG Ratio: 1.8 (calc) (ref 1.0–2.5)
ALT: 13 U/L (ref 6–29)
AST: 11 U/L (ref 10–35)
Albumin: 4.5 g/dL (ref 3.6–5.1)
Alkaline phosphatase (APISO): 55 U/L (ref 37–153)
BUN/Creatinine Ratio: 35 (calc) — ABNORMAL HIGH (ref 6–22)
BUN: 17 mg/dL (ref 7–25)
CO2: 27 mmol/L (ref 20–32)
Calcium: 9.8 mg/dL (ref 8.6–10.4)
Chloride: 102 mmol/L (ref 98–110)
Creat: 0.49 mg/dL — ABNORMAL LOW (ref 0.50–1.03)
Globulin: 2.5 g/dL (ref 1.9–3.7)
Glucose, Bld: 124 mg/dL — ABNORMAL HIGH (ref 65–99)
Potassium: 4.4 mmol/L (ref 3.5–5.3)
Sodium: 139 mmol/L (ref 135–146)
Total Bilirubin: 0.4 mg/dL (ref 0.2–1.2)
Total Protein: 7 g/dL (ref 6.1–8.1)
eGFR: 109 mL/min/1.73m2 (ref >=60)

## 2023-12-05 LAB — HEMOGLOBIN A1C
Hgb A1c MFr Bld: 6.2 % — ABNORMAL HIGH (ref <5.7)
Mean Plasma Glucose: 131 mg/dL
eAG (mmol/L): 7.3 mmol/L

## 2023-12-05 LAB — LIPID PANEL
Cholesterol: 143 mg/dL (ref <200)
HDL: 65 mg/dL (ref >=50)
LDL Cholesterol (Calc): 61 mg/dL
Non-HDL Cholesterol (Calc): 78 mg/dL (ref <130)
Total CHOL/HDL Ratio: 2.2 (calc) (ref <5.0)
Triglycerides: 85 mg/dL (ref <150)

## 2023-12-05 LAB — TSH: TSH: 0.39 m[IU]/L — ABNORMAL LOW (ref 0.40–4.50)

## 2023-12-05 LAB — T4, FREE: Free T4: 1.7 ng/dL (ref 0.8–1.8)

## 2023-12-06 ENCOUNTER — Other Ambulatory Visit: Payer: Self-pay

## 2023-12-06 MED ORDER — LEVOTHYROXINE SODIUM 88 MCG PO TABS
88.0000 ug | ORAL_TABLET | Freq: Every day | ORAL | 3 refills | Status: AC
  Filled 2023-12-06: qty 90, 90d supply, fill #0

## 2023-12-06 MED ORDER — SEMAGLUTIDE (1 MG/DOSE) 4 MG/3ML ~~LOC~~ SOPN
1.0000 mg | PEN_INJECTOR | SUBCUTANEOUS | 0 refills | Status: DC
  Filled 2023-12-06: qty 3, 28d supply, fill #0
  Filled 2024-01-01: qty 3, 28d supply, fill #1
  Filled 2024-01-28: qty 3, 28d supply, fill #2

## 2023-12-07 ENCOUNTER — Other Ambulatory Visit: Payer: Self-pay

## 2023-12-17 ENCOUNTER — Other Ambulatory Visit: Payer: Self-pay

## 2023-12-17 ENCOUNTER — Other Ambulatory Visit: Payer: Self-pay | Admitting: Internal Medicine

## 2023-12-17 DIAGNOSIS — I10 Essential (primary) hypertension: Secondary | ICD-10-CM

## 2023-12-17 DIAGNOSIS — I471 Supraventricular tachycardia, unspecified: Secondary | ICD-10-CM

## 2023-12-17 DIAGNOSIS — I25118 Atherosclerotic heart disease of native coronary artery with other forms of angina pectoris: Secondary | ICD-10-CM

## 2023-12-18 ENCOUNTER — Other Ambulatory Visit: Payer: Self-pay

## 2023-12-18 MED ORDER — LOSARTAN POTASSIUM 50 MG PO TABS
50.0000 mg | ORAL_TABLET | Freq: Every day | ORAL | 1 refills | Status: AC
  Filled 2023-12-18: qty 90, 90d supply, fill #0

## 2023-12-18 MED ORDER — DILTIAZEM HCL ER COATED BEADS 120 MG PO CP24
120.0000 mg | ORAL_CAPSULE | Freq: Every day | ORAL | 0 refills | Status: DC
  Filled 2023-12-18: qty 30, 30d supply, fill #0

## 2023-12-18 NOTE — Telephone Encounter (Signed)
 Left voice mail

## 2023-12-18 NOTE — Telephone Encounter (Signed)
 Requested Prescriptions  Pending Prescriptions Disp Refills   losartan  (COZAAR ) 50 MG tablet 90 tablet 1    Sig: Take 1 tablet (50 mg total) by mouth daily.     Cardiovascular:  Angiotensin Receptor Blockers Failed - 12/18/2023 10:42 AM      Failed - Cr in normal range and within 180 days    Creatinine  Date Value Ref Range Status  02/18/2013 0.8 0.6 - 1.1 mg/dL Final   Creat  Date Value Ref Range Status  12/04/2023 0.49 (L) 0.50 - 1.03 mg/dL Final   Creatinine,U  Date Value Ref Range Status  01/12/2009 122.4 mg/dL Final   Creatinine, Urine  Date Value Ref Range Status  05/31/2023 82 20 - 275 mg/dL Final         Passed - K in normal range and within 180 days    Potassium  Date Value Ref Range Status  12/04/2023 4.4 3.5 - 5.3 mmol/L Final  02/18/2013 4.4 3.5 - 5.1 mEq/L Final         Passed - Patient is not pregnant      Passed - Last BP in normal range    BP Readings from Last 1 Encounters:  12/04/23 134/80         Passed - Valid encounter within last 6 months    Recent Outpatient Visits           2 weeks ago Type 2 diabetes mellitus with diabetic polyneuropathy, unspecified whether long term insulin  use [facility])   [place] [facility] [place], Angeline ORN, NP   6 months ago Encounter for general adult medical examination with abnormal findings   [place] [facility] [place], Angeline ORN, NP

## 2024-01-03 ENCOUNTER — Other Ambulatory Visit: Payer: Self-pay

## 2024-01-03 ENCOUNTER — Other Ambulatory Visit: Payer: Self-pay | Admitting: Internal Medicine

## 2024-01-04 ENCOUNTER — Telehealth: Payer: Self-pay | Admitting: Pharmacy Technician

## 2024-01-04 ENCOUNTER — Other Ambulatory Visit: Payer: Self-pay

## 2024-01-04 ENCOUNTER — Other Ambulatory Visit (HOSPITAL_COMMUNITY): Payer: Self-pay

## 2024-01-04 ENCOUNTER — Encounter: Payer: Self-pay | Admitting: Internal Medicine

## 2024-01-04 MED FILL — Continuous Glucose System Sensor: 90 days supply | Qty: 6 | Fill #0 | Status: AC

## 2024-01-04 NOTE — Telephone Encounter (Signed)
 Requested medication (s) are due for refill today: yes  Requested medication (s) are on the active medication list: yes  Last refill:  10/02/23  Future visit scheduled: no  Notes to clinic:  routing for review     Requested Prescriptions  Pending Prescriptions Disp Refills   Continuous Glucose Sensor (FREESTYLE LIBRE 3 PLUS SENSOR) MISC [Pharmacy Med Name: Continuous Glucose Sensor (FREESTYLE LIBRE 3 PLUS SENSOR) Misc] 6 each 0    Sig: Use to check blood sugar and change sensor every 15 days.     There is no refill protocol information for this order

## 2024-01-04 NOTE — Telephone Encounter (Signed)
 PA request has been Received. New Encounter has been or will be created for follow up. For additional info see Pharmacy Prior Auth telephone encounter from 01/04/2024.

## 2024-01-04 NOTE — Telephone Encounter (Signed)
 Pharmacy Patient Advocate Encounter   Received notification from Patient Advice Request messages that prior authorization for Freestyle Libre 3 Plus sensors is required/requested.   Insurance verification completed.   The patient is insured through [facility].   Per test claim: The current 90 day co-pay is, $90.00.  No PA needed at this time. This test claim was processed through [facility]- copay amounts may vary at other pharmacies due to pharmacy/plan contracts, or as the patient moves through the different stages of their insurance plan.    It appears she may just need a refill authorization send to her preferred pharmacy.

## 2024-01-05 ENCOUNTER — Other Ambulatory Visit: Payer: Self-pay

## 2024-01-21 ENCOUNTER — Other Ambulatory Visit: Payer: Self-pay | Admitting: Internal Medicine

## 2024-01-21 ENCOUNTER — Other Ambulatory Visit: Payer: Self-pay

## 2024-01-21 DIAGNOSIS — I10 Essential (primary) hypertension: Secondary | ICD-10-CM

## 2024-01-21 DIAGNOSIS — I471 Supraventricular tachycardia, unspecified: Secondary | ICD-10-CM

## 2024-01-22 ENCOUNTER — Other Ambulatory Visit: Payer: Self-pay

## 2024-01-23 ENCOUNTER — Other Ambulatory Visit: Payer: Self-pay

## 2024-01-23 NOTE — Telephone Encounter (Signed)
 Requested medication (s) are due for refill today: yes  Requested medication (s) are on the active medication list: yes  Last refill:  12/18/23  Future visit scheduled: yes  Notes to clinic:  Unable to refill per protocol, last refill by another provider.       Requested Prescriptions  Pending Prescriptions Disp Refills   diltiazem  (CARDIZEM  CD) 120 MG 24 hr capsule 30 capsule 0    Sig: Take 1 capsule (120 mg total) by mouth daily. Please contact office for future appointment and refills, Thank you [phone]     Cardiovascular: Calcium  Channel Blockers 3 Failed - 01/23/2024  9:36 AM      Failed - Cr in normal range and within 360 days    Creatinine  Date Value Ref Range Status  02/18/2013 0.8 0.6 - 1.1 mg/dL Final   Creat  Date Value Ref Range Status  12/04/2023 0.49 (L) 0.50 - 1.03 mg/dL Final   Creatinine,U  Date Value Ref Range Status  01/12/2009 122.4 mg/dL Final   Creatinine, Urine  Date Value Ref Range Status  05/31/2023 82 20 - 275 mg/dL Final         Passed - ALT in normal range and within 360 days    ALT  Date Value Ref Range Status  12/04/2023 13 6 - 29 U/L Final  05/19/2021 15 0 - 44 U/L Final  02/18/2013 16 0 - 55 U/L Final         Passed - AST in normal range and within 360 days    AST  Date Value Ref Range Status  12/04/2023 11 10 - 35 U/L Final  05/19/2021 14 (L) 15 - 41 U/L Final  02/18/2013 10 5 - 34 U/L Final         Passed - Last BP in normal range    BP Readings from Last 1 Encounters:  12/04/23 134/80         Passed - Last Heart Rate in normal range    Pulse Readings from Last 1 Encounters:  11/14/22 93         Passed - Valid encounter within last 6 months    Recent Outpatient Visits           1 month ago Type 2 diabetes mellitus with diabetic polyneuropathy, unspecified whether long term insulin  use (HCC)   [place] [facility] [place], Angeline ORN, NP   7 months ago Encounter for general adult medical  examination with abnormal findings    [facility] [place], Angeline ORN, NP

## 2024-01-28 ENCOUNTER — Other Ambulatory Visit: Payer: Self-pay

## 2024-01-28 DIAGNOSIS — I471 Supraventricular tachycardia, unspecified: Secondary | ICD-10-CM

## 2024-01-28 DIAGNOSIS — I10 Essential (primary) hypertension: Secondary | ICD-10-CM

## 2024-01-28 MED ORDER — DILTIAZEM HCL ER COATED BEADS 120 MG PO CP24
120.0000 mg | ORAL_CAPSULE | Freq: Every day | ORAL | 0 refills | Status: DC
  Filled 2024-01-28: qty 30, 30d supply, fill #0

## 2024-01-28 NOTE — Telephone Encounter (Signed)
 Patient is overdue appointment with Lonni Hanson, MD for further refills. 1st attempt  [phone]

## 2024-01-29 ENCOUNTER — Other Ambulatory Visit: Payer: Self-pay

## 2024-02-18 ENCOUNTER — Other Ambulatory Visit: Payer: Self-pay | Admitting: Internal Medicine

## 2024-02-18 ENCOUNTER — Other Ambulatory Visit: Payer: Self-pay

## 2024-02-18 DIAGNOSIS — I25118 Atherosclerotic heart disease of native coronary artery with other forms of angina pectoris: Secondary | ICD-10-CM

## 2024-02-18 DIAGNOSIS — E1159 Type 2 diabetes mellitus with other circulatory complications: Secondary | ICD-10-CM

## 2024-02-18 MED FILL — Pantoprazole Sodium EC Tab 40 MG (Base Equiv): ORAL | 90 days supply | Qty: 90 | Fill #1 | Status: AC

## 2024-02-19 ENCOUNTER — Other Ambulatory Visit: Payer: Self-pay

## 2024-02-19 MED ORDER — ROSUVASTATIN CALCIUM 20 MG PO TABS
20.0000 mg | ORAL_TABLET | Freq: Every day | ORAL | 1 refills | Status: AC
  Filled 2024-02-19: qty 90, 90d supply, fill #0

## 2024-02-19 MED ORDER — XIGDUO XR 2.5-1000 MG PO TB24
2.0000 | ORAL_TABLET | Freq: Every day | ORAL | 1 refills | Status: AC
  Filled 2024-02-19 – 2024-02-20 (×4): qty 180, 90d supply, fill #0

## 2024-02-19 NOTE — Telephone Encounter (Signed)
 Requested Prescriptions  Pending Prescriptions Disp Refills   Dapagliflozin  Pro-metFORMIN  ER (XIGDUO  XR) 2.05-998 MG TB24 180 tablet 1    Sig: Take 2 tablets by mouth daily.     Endocrinology:  Diabetes - Biguanide + SGLT2 Inhibitor Combos Failed - 02/19/2024 11:39 AM      Failed - Cr in normal range and within 360 days    Creatinine  Date Value Ref Range Status  02/18/2013 0.8 0.6 - 1.1 mg/dL Final   Creat  Date Value Ref Range Status  12/04/2023 0.49 (L) 0.50 - 1.03 mg/dL Final   Creatinine,U  Date Value Ref Range Status  01/12/2009 122.4 mg/dL Final   Creatinine, Urine  Date Value Ref Range Status  05/31/2023 82 20 - 275 mg/dL Final         Failed - B12 Level in normal range and within 720 days    Vitamin B-12  Date Value Ref Range Status  03/21/2017 407 232 - 1,245 pg/mL Final         Failed - CBC within normal limits and completed in the last 12 months    WBC  Date Value Ref Range Status  12/04/2023 8.9 3.8 - 10.8 Thousand/uL Final   RBC  Date Value Ref Range Status  12/04/2023 4.66 3.80 - 5.10 Million/uL Final   Hemoglobin  Date Value Ref Range Status  12/04/2023 12.9 11.7 - 15.5 g/dL Final  95/72/7976 86.8 12.0 - 15.0 g/dL Final  97/72/7980 86.4 11.1 - 15.9 g/dL Final   HGB  Date Value Ref Range Status  02/18/2013 12.5 11.6 - 15.9 g/dL Final   HCT  Date Value Ref Range Status  12/04/2023 39.9 35.0 - 45.0 % Final  02/18/2013 39.6 34.8 - 46.6 % Final   Hematocrit  Date Value Ref Range Status  03/21/2017 42.0 34.0 - 46.6 % Final   MCHC  Date Value Ref Range Status  12/04/2023 32.3 32.0 - 36.0 g/dL Final    Comment:    For adults, a slight decrease in the calculated MCHC value (in the range of 30 to 32 g/dL) is most likely not clinically significant; however, it should be interpreted with caution in correlation with other red cell parameters and the patient's clinical condition.    [facility]  Date Value Ref Range Status  12/04/2023 27.7 27.0 - 33.0  pg Final   MCV  Date Value Ref Range Status  12/04/2023 85.6 80.0 - 100.0 fL Final  03/21/2017 86 79 - 97 fL Final  02/18/2013 82.6 79.5 - 101.0 fL Final   No results found for: PLTCOUNTKUC, LABPLAT, POCPLA RDW  Date Value Ref Range Status  12/04/2023 13.8 11.0 - 15.0 % Final  03/21/2017 14.6 12.3 - 15.4 % Final  02/18/2013 16.4 (H) 11.2 - 14.5 % Final         Passed - HBA1C is between 0 and 7.9 and within 180 days    Hgb A1c MFr Bld  Date Value Ref Range Status  12/04/2023 6.2 (H) <5.7 % Final    Comment:    For someone without known diabetes, a hemoglobin  A1c value between 5.7% and 6.4% is consistent with prediabetes and should be confirmed with a  follow-up test. . For someone with known diabetes, a value <7% indicates that their diabetes is well controlled. A1c targets should be individualized based on duration of diabetes, age, comorbid conditions, and other considerations. . This assay result is consistent with an increased risk of diabetes. . Currently, no consensus exists  regarding use of hemoglobin A1c for diagnosis of diabetes for children. .          Passed - eGFR in normal range and within 360 days    GFR, Est AFR Am  Date Value Ref Range Status  07/16/2017 >60 >60 mL/min Final    Comment:    (NOTE) The eGFR has been calculated using the CKD EPI equation. This calculation has not been validated in all clinical situations. eGFR's persistently <60 mL/min signify possible Chronic Kidney Disease.    GFR calc Af Amer  Date Value Ref Range Status  08/28/2019 >60 >60 mL/min Final   GFR, Estimated  Date Value Ref Range Status  04/05/2022 >60 >60 mL/min Final    Comment:    (NOTE) Calculated using the CKD-EPI Creatinine Equation (2021)   05/19/2021 >60 >60 mL/min Final    Comment:    (NOTE) Calculated using the CKD-EPI Creatinine Equation (2021)    GFR  Date Value Ref Range Status  08/11/2021 100.66 >60.00 mL/min Final    Comment:     Calculated using the CKD-EPI Creatinine Equation (2021)   eGFR  Date Value Ref Range Status  12/04/2023 109 > OR = 60 mL/min/1.63m2 Final         Passed - Valid encounter within last 6 months    Recent Outpatient Visits           2 months ago Type 2 diabetes mellitus with diabetic polyneuropathy, unspecified whether long term insulin  use [facility])   [facility] [place], Angeline ORN, NP   8 months ago Encounter for general adult medical examination with abnormal findings   [facility] [place], [place], NP               rosuvastatin  (CRESTOR ) 20 MG tablet 90 tablet 1    Sig: Take 1 tablet (20 mg total) by mouth daily.     Cardiovascular:  Antilipid - Statins 2 Failed - 02/19/2024 11:39 AM      Failed - Cr in normal range and within 360 days    Creatinine  Date Value Ref Range Status  02/18/2013 0.8 0.6 - 1.1 mg/dL Final   Creat  Date Value Ref Range Status  12/04/2023 0.49 (L) 0.50 - 1.03 mg/dL Final   Creatinine,U  Date Value Ref Range Status  01/12/2009 122.4 mg/dL Final   Creatinine, Urine  Date Value Ref Range Status  05/31/2023 82 20 - 275 mg/dL Final         Failed - Lipid Panel in normal range within the last 12 months    Cholesterol, Total  Date Value Ref Range Status  03/21/2017 122 100 - 199 mg/dL Final   Cholesterol  Date Value Ref Range Status  12/04/2023 143 <200 mg/dL Final   LDL Cholesterol (Calc)  Date Value Ref Range Status  12/04/2023 61 mg/dL (calc) Final    Comment:    Reference range: <100 . Desirable range <100 mg/dL for primary prevention;   <70 mg/dL for patients with CHD or diabetic patients  with > or = 2 CHD risk factors. SABRA LDL-C is now calculated using the Martin-Hopkins  calculation, which is a validated novel method providing  better accuracy than the Friedewald equation in the  estimation of LDL-C.  Gladis APPLETHWAITE et al. SANDREA. 7986;689(80): 2061-2068   (http://education.[employer].com/faq/FAQ164)    Direct LDL  Date Value Ref Range Status  09/01/2014 153.0 mg/dL Final    Comment:  Optimal:  <100 mg/dLNear or Above Optimal:  100-129 mg/dLBorderline High:  130-159 mg/dLHigh:  160-189 mg/dLVery High:  >190 mg/dL   HDL  Date Value Ref Range Status  12/04/2023 65 > OR = 50 mg/dL Final  97/72/7980 41 >60 mg/dL Final   Triglycerides  Date Value Ref Range Status  12/04/2023 85 <150 mg/dL Final         Passed - Patient is not pregnant      Passed - Valid encounter within last 12 months    Recent Outpatient Visits           2 months ago Type 2 diabetes mellitus with diabetic polyneuropathy, unspecified whether long term insulin  use [facility])   [facility] [place] [place], Angeline ORN, NP   8 months ago Encounter for general adult medical examination with abnormal findings   [facility] [place], Angeline ORN, NP

## 2024-02-20 ENCOUNTER — Other Ambulatory Visit (HOSPITAL_COMMUNITY): Payer: Self-pay

## 2024-02-20 ENCOUNTER — Other Ambulatory Visit: Payer: Self-pay

## 2024-02-24 ENCOUNTER — Other Ambulatory Visit: Payer: Self-pay | Admitting: Internal Medicine

## 2024-02-26 ENCOUNTER — Other Ambulatory Visit: Payer: Self-pay

## 2024-02-26 ENCOUNTER — Ambulatory Visit: Admitting: Cardiology

## 2024-02-26 ENCOUNTER — Encounter: Payer: Self-pay | Admitting: Cardiology

## 2024-02-26 VITALS — BP 126/70 | HR 92 | Wt 174.4 lb

## 2024-02-26 DIAGNOSIS — R002 Palpitations: Secondary | ICD-10-CM

## 2024-02-26 DIAGNOSIS — E785 Hyperlipidemia, unspecified: Secondary | ICD-10-CM

## 2024-02-26 DIAGNOSIS — I10 Essential (primary) hypertension: Secondary | ICD-10-CM | POA: Diagnosis not present

## 2024-02-26 DIAGNOSIS — I471 Supraventricular tachycardia, unspecified: Secondary | ICD-10-CM

## 2024-02-26 DIAGNOSIS — I25118 Atherosclerotic heart disease of native coronary artery with other forms of angina pectoris: Secondary | ICD-10-CM

## 2024-02-26 DIAGNOSIS — E1169 Type 2 diabetes mellitus with other specified complication: Secondary | ICD-10-CM

## 2024-02-26 MED ORDER — METOPROLOL TARTRATE 100 MG PO TABS
100.0000 mg | ORAL_TABLET | Freq: Two times a day (BID) | ORAL | 3 refills | Status: AC
  Filled 2024-02-26: qty 180, 90d supply, fill #0

## 2024-02-26 MED ORDER — OZEMPIC (1 MG/DOSE) 4 MG/3ML ~~LOC~~ SOPN
1.0000 mg | PEN_INJECTOR | SUBCUTANEOUS | 0 refills | Status: AC
  Filled 2024-02-26: qty 9, 84d supply, fill #0

## 2024-02-26 NOTE — Telephone Encounter (Signed)
 Requested Prescriptions  Pending Prescriptions Disp Refills   Semaglutide , 1 MG/DOSE, (OZEMPIC , 1 MG/DOSE,) 4 MG/3ML SOPN 9 mL 0    Sig: Inject 1 mg as directed once a week.     Endocrinology:  Diabetes - GLP-1 Receptor Agonists - semaglutide  Failed - 02/26/2024 11:46 AM      Failed - HBA1C in normal range and within 180 days    Hgb A1c MFr Bld  Date Value Ref Range Status  12/04/2023 6.2 (H) <5.7 % Final    Comment:    For someone without known diabetes, a hemoglobin  A1c value between 5.7% and 6.4% is consistent with prediabetes and should be confirmed with a  follow-up test. . For someone with known diabetes, a value <7% indicates that their diabetes is well controlled. A1c targets should be individualized based on duration of diabetes, age, comorbid conditions, and other considerations. . This assay result is consistent with an increased risk of diabetes. . Currently, no consensus exists regarding use of hemoglobin A1c for diagnosis of diabetes for children. .          Failed - Cr in normal range and within 360 days    Creatinine  Date Value Ref Range Status  02/18/2013 0.8 0.6 - 1.1 mg/dL Final   Creat  Date Value Ref Range Status  12/04/2023 0.49 (L) 0.50 - 1.03 mg/dL Final   Creatinine,U  Date Value Ref Range Status  01/12/2009 122.4 mg/dL Final   Creatinine, Urine  Date Value Ref Range Status  05/31/2023 82 20 - 275 mg/dL Final         Passed - Valid encounter within last 6 months    Recent Outpatient Visits           2 months ago Type 2 diabetes mellitus with diabetic polyneuropathy, unspecified whether long term insulin  use [facility])   [place] [place], Angeline ORN, NP   9 months ago Encounter for general adult medical examination with abnormal findings   [place] [facility] [place], Angeline ORN, NP       Future Appointments             Today Gerard Frederick, NP [facility]

## 2024-02-26 NOTE — Patient Instructions (Addendum)
 Medication Instructions:  Your physician recommends the following medication changes.  STOP TAKING: Diltiazem   TAKE: Metoprolol  Tartrate 100 mg twice daily   *If you need a refill on your cardiac medications before your next appointment, please call your pharmacy*  Lab Work: None ordered If you have labs (blood work) drawn today and your tests are completely normal, you will receive your results only by: MyChart Message (if you have MyChart) OR A paper copy in the mail If you have any lab test that is abnormal or we need to change your treatment, we will call you to review the results.  Testing/Procedures: None ordered  Follow-Up: At [facility], you and your health needs are our priority.  As part of our continuing mission to provide you with exceptional heart care, our providers are all part of one team.  This team includes your primary Cardiologist (physician) and Advanced Practice Providers or APPs (Physician Assistants and Nurse Practitioners) who all work together to provide you with the care you need, when you need it.  Your next appointment:   12 month(s)  Provider:   You may see Lonni Hanson, MD or one of the following Advanced Practice Providers on your designated Care Team:   Tylene Lunch, NP   We recommend signing up for the patient portal called MyChart.  Sign up information is provided on this After Visit Summary.  MyChart is used to connect with patients for Virtual Visits (Telemedicine).  Patients are able to view lab/test results, encounter notes, upcoming appointments, etc.  Non-urgent messages can be sent to your provider as well.   To learn more about what you can do with MyChart, go to [email or web address].

## 2024-06-05 ENCOUNTER — Encounter: Admitting: Internal Medicine

## 2024-08-27 ENCOUNTER — Ambulatory Visit: Admitting: Podiatry
